# Patient Record
Sex: Female | Born: 1935 | Race: White | Hispanic: No | Marital: Married | Smoking: Former smoker
Health system: Southern US, Community
[De-identification: ages and names within clinical notes are randomized; demographics above are authoritative.]

## PROBLEM LIST (undated history)

## (undated) DIAGNOSIS — N739 Female pelvic inflammatory disease, unspecified: Secondary | ICD-10-CM

## (undated) DIAGNOSIS — K5732 Diverticulitis of large intestine without perforation or abscess without bleeding: Secondary | ICD-10-CM

## (undated) DIAGNOSIS — I700 Atherosclerosis of aorta: Secondary | ICD-10-CM

## (undated) DIAGNOSIS — M5136 Other intervertebral disc degeneration, lumbar region: Secondary | ICD-10-CM

## (undated) DIAGNOSIS — C801 Malignant (primary) neoplasm, unspecified: Secondary | ICD-10-CM

## (undated) DIAGNOSIS — E669 Obesity, unspecified: Secondary | ICD-10-CM

## (undated) DIAGNOSIS — Z923 Personal history of irradiation: Secondary | ICD-10-CM

## (undated) DIAGNOSIS — M21372 Foot drop, left foot: Secondary | ICD-10-CM

## (undated) DIAGNOSIS — N289 Disorder of kidney and ureter, unspecified: Secondary | ICD-10-CM

## (undated) DIAGNOSIS — E789 Disorder of lipoprotein metabolism, unspecified: Secondary | ICD-10-CM

## (undated) DIAGNOSIS — D72819 Decreased white blood cell count, unspecified: Secondary | ICD-10-CM

## (undated) DIAGNOSIS — E039 Hypothyroidism, unspecified: Secondary | ICD-10-CM

## (undated) DIAGNOSIS — M4310 Spondylolisthesis, site unspecified: Secondary | ICD-10-CM

## (undated) DIAGNOSIS — M47816 Spondylosis without myelopathy or radiculopathy, lumbar region: Secondary | ICD-10-CM

## (undated) DIAGNOSIS — Z800 Family history of malignant neoplasm of digestive organs: Secondary | ICD-10-CM

## (undated) DIAGNOSIS — E7800 Pure hypercholesterolemia, unspecified: Secondary | ICD-10-CM

## (undated) DIAGNOSIS — G629 Polyneuropathy, unspecified: Secondary | ICD-10-CM

## (undated) DIAGNOSIS — K219 Gastro-esophageal reflux disease without esophagitis: Secondary | ICD-10-CM

## (undated) DIAGNOSIS — M1990 Unspecified osteoarthritis, unspecified site: Secondary | ICD-10-CM

## (undated) DIAGNOSIS — M51369 Other intervertebral disc degeneration, lumbar region without mention of lumbar back pain or lower extremity pain: Secondary | ICD-10-CM

## (undated) DIAGNOSIS — M8580 Other specified disorders of bone density and structure, unspecified site: Secondary | ICD-10-CM

## (undated) DIAGNOSIS — Z8719 Personal history of other diseases of the digestive system: Secondary | ICD-10-CM

## (undated) DIAGNOSIS — M1712 Unilateral primary osteoarthritis, left knee: Secondary | ICD-10-CM

## (undated) DIAGNOSIS — K635 Polyp of colon: Secondary | ICD-10-CM

## (undated) DIAGNOSIS — K429 Umbilical hernia without obstruction or gangrene: Secondary | ICD-10-CM

## (undated) HISTORY — DX: Personal history of irradiation: Z92.3

## (undated) HISTORY — DX: Other intervertebral disc degeneration, lumbar region: M51.36

## (undated) HISTORY — DX: Unilateral primary osteoarthritis, left knee: M17.12

## (undated) HISTORY — PX: COLONOSCOPY: SHX174

## (undated) HISTORY — DX: Disorder of lipoprotein metabolism, unspecified: E78.9

## (undated) HISTORY — PX: TUBAL LIGATION: SHX77

## (undated) HISTORY — DX: Pure hypercholesterolemia, unspecified: E78.00

## (undated) HISTORY — DX: Obesity, unspecified: E66.9

## (undated) HISTORY — DX: Other specified disorders of bone density and structure, unspecified site: M85.80

## (undated) HISTORY — DX: Family history of malignant neoplasm of digestive organs: Z80.0

## (undated) HISTORY — DX: Other intervertebral disc degeneration, lumbar region without mention of lumbar back pain or lower extremity pain: M51.369

## (undated) HISTORY — PX: CATARACT EXTRACTION, BILATERAL: SHX1313

## (undated) HISTORY — DX: Polyp of colon: K63.5

## (undated) HISTORY — DX: Gastro-esophageal reflux disease without esophagitis: K21.9

## (undated) HISTORY — PX: CHOLECYSTECTOMY: SHX55

## (undated) HISTORY — PX: PTERYGIUM EXCISION: SHX2273

---

## 1999-04-26 ENCOUNTER — Encounter: Payer: Self-pay | Admitting: Emergency Medicine

## 1999-04-26 ENCOUNTER — Emergency Department (HOSPITAL_COMMUNITY): Admission: EM | Admit: 1999-04-26 | Discharge: 1999-04-26 | Payer: Self-pay | Admitting: Emergency Medicine

## 2001-03-15 ENCOUNTER — Ambulatory Visit (HOSPITAL_BASED_OUTPATIENT_CLINIC_OR_DEPARTMENT_OTHER): Admission: RE | Admit: 2001-03-15 | Discharge: 2001-03-15 | Payer: Self-pay | Admitting: *Deleted

## 2001-09-09 ENCOUNTER — Encounter: Admission: RE | Admit: 2001-09-09 | Discharge: 2001-09-23 | Payer: Self-pay | Admitting: Internal Medicine

## 2001-09-09 ENCOUNTER — Encounter (INDEPENDENT_AMBULATORY_CARE_PROVIDER_SITE_OTHER): Payer: Self-pay | Admitting: Specialist

## 2002-01-25 ENCOUNTER — Encounter: Payer: Self-pay | Admitting: Internal Medicine

## 2002-01-25 ENCOUNTER — Encounter: Admission: RE | Admit: 2002-01-25 | Discharge: 2002-01-25 | Payer: Self-pay | Admitting: Internal Medicine

## 2002-02-24 ENCOUNTER — Other Ambulatory Visit: Admission: RE | Admit: 2002-02-24 | Discharge: 2002-02-24 | Payer: Self-pay | Admitting: Obstetrics & Gynecology

## 2002-03-28 ENCOUNTER — Ambulatory Visit (HOSPITAL_COMMUNITY): Admission: RE | Admit: 2002-03-28 | Discharge: 2002-03-28 | Payer: Self-pay | Admitting: Gastroenterology

## 2004-01-30 ENCOUNTER — Encounter: Admission: RE | Admit: 2004-01-30 | Discharge: 2004-01-30 | Payer: Self-pay | Admitting: Internal Medicine

## 2004-07-17 ENCOUNTER — Other Ambulatory Visit: Admission: RE | Admit: 2004-07-17 | Discharge: 2004-07-17 | Payer: Self-pay | Admitting: Obstetrics & Gynecology

## 2006-01-08 ENCOUNTER — Other Ambulatory Visit: Admission: RE | Admit: 2006-01-08 | Discharge: 2006-01-08 | Payer: Self-pay | Admitting: Obstetrics & Gynecology

## 2007-02-04 ENCOUNTER — Encounter: Admission: RE | Admit: 2007-02-04 | Discharge: 2007-02-04 | Payer: Self-pay | Admitting: Obstetrics & Gynecology

## 2008-03-01 ENCOUNTER — Encounter: Admission: RE | Admit: 2008-03-01 | Discharge: 2008-03-01 | Payer: Self-pay | Admitting: Obstetrics & Gynecology

## 2008-06-22 ENCOUNTER — Encounter: Admission: RE | Admit: 2008-06-22 | Discharge: 2008-06-22 | Payer: Self-pay | Admitting: Family Medicine

## 2009-06-21 ENCOUNTER — Other Ambulatory Visit: Admission: RE | Admit: 2009-06-21 | Discharge: 2009-06-21 | Payer: Self-pay | Admitting: *Deleted

## 2009-08-27 ENCOUNTER — Encounter: Admission: RE | Admit: 2009-08-27 | Discharge: 2009-08-27 | Payer: Self-pay | Admitting: *Deleted

## 2010-08-20 ENCOUNTER — Other Ambulatory Visit: Admission: RE | Admit: 2010-08-20 | Discharge: 2010-08-20 | Payer: Self-pay | Admitting: Internal Medicine

## 2010-08-29 ENCOUNTER — Encounter: Admission: RE | Admit: 2010-08-29 | Discharge: 2010-08-29 | Payer: Self-pay | Admitting: Internal Medicine

## 2010-08-29 LAB — HM MAMMOGRAPHY

## 2010-11-28 ENCOUNTER — Ambulatory Visit (INDEPENDENT_AMBULATORY_CARE_PROVIDER_SITE_OTHER): Payer: 59 | Admitting: Family Medicine

## 2010-11-28 DIAGNOSIS — E7800 Pure hypercholesterolemia, unspecified: Secondary | ICD-10-CM

## 2010-11-28 DIAGNOSIS — E039 Hypothyroidism, unspecified: Secondary | ICD-10-CM

## 2010-12-12 ENCOUNTER — Ambulatory Visit (INDEPENDENT_AMBULATORY_CARE_PROVIDER_SITE_OTHER): Payer: 59

## 2010-12-12 ENCOUNTER — Other Ambulatory Visit: Payer: Self-pay | Admitting: Family Medicine

## 2010-12-12 DIAGNOSIS — Z23 Encounter for immunization: Secondary | ICD-10-CM

## 2010-12-12 DIAGNOSIS — Z780 Asymptomatic menopausal state: Secondary | ICD-10-CM

## 2010-12-26 ENCOUNTER — Ambulatory Visit
Admission: RE | Admit: 2010-12-26 | Discharge: 2010-12-26 | Disposition: A | Discharge status: Home / Self Care | Payer: 59 | Source: Ambulatory Visit | Attending: Family Medicine | Admitting: Family Medicine

## 2010-12-26 DIAGNOSIS — Z780 Asymptomatic menopausal state: Secondary | ICD-10-CM

## 2010-12-26 LAB — HM DEXA SCAN

## 2011-02-14 NOTE — Op Note (Signed)
[place]. [facility]  Patient:    Kayla Guerrero, Kayla Guerrero                     MRN: [id number] Proc. Date: 03/15/01 Adm. Date:  [id number] Attending:  Claudina Lick                           Operative Report  PREOPERATIVE DIAGNOSIS:  Chronic left secretory otitis media.  POSTOPERATIVE DIAGNOSIS:  Chronic left otitis media.  OPERATION PERFORMED:  Left tube myringotomy.  SURGEON:  Robert L. Lyman Bishop, M.D.  ANESTHESIA:  General.  INDICATIONS FOR PROCEDURE:  This 76 year old white female has had a history of a persistent chronic left ear infection with persistent middle ear fluid.  It started with an upper respiratory infection.  She has been treated with antibiotics, decongestants, sinuses have cleared with a normal CT scan. Examination of the nasopharynx was negative.  The patient has had persistent fluid in the left middle ear and is admitted for tube myringotomy.  DESCRIPTION OF PROCEDURE:  After satisfactory general mask anesthesia had been induced using the LMA, examination showed an opaque hypervascular, whitish slightly bulging eardrum.  A radial myringotomy incision was made in the anterior inferior quadrant and mostly clear mucoid exudate was evacuated.  It did not seem to remove the fluid more posteriorly.  A Donaldson Silastic collar button tube was inserted in the incision and an additional radial myringotomy incision was made in the posterior inferior quadrant and some thick mucopurulent exudate was evacuated with the suction.  Following this, the eardrum had a more translucent clear appearance.  Cortisporin suspension drops were instilled and insufflated through the tube and additional myringotomy incision and down the eustachian tube with ease.  ESTIMATED BLOOD LOSS:  1 to 2 cc.  The patient tolerated the procedure well, was awakened from anesthesia and taken to the recovery room in satisfactory condition. DD:  03/15/01 TD:   03/15/01 Job: [id number]

## 2011-02-14 NOTE — Procedures (Signed)
[place]. [facility]  Patient:    Kayla Guerrero, Kayla Guerrero Visit Number: [id number] MRN: [id number]          Service Type: END Location: ENDO Attending Physician:  Rich Brave Dictated by:   Florencia Reasons, M.D. Proc. Date: 03/28/02 Admit Date:  03/28/2002   CC:         [place]. Daphine Deutscher, M.D.   Procedure Report  PROCEDURE:  Colonoscopy with biopsy.  INDICATION:  A 76 year old female, for colon cancer screening.  There is a family history of colon cancer in her mother, who was diagnosed in her 48s.  FINDINGS:  Diminutive cecal polyp.  Scattered diverticulosis.  DESCRIPTION OF PROCEDURE:  The nature, purpose, and risks of the procedure had been discussed with the patient, who provided written consent.  The patient received sedation with fentanyl 50 mcg and Versed 5 mg IV without arrhythmias or desaturation.  An Olympus adjustable-tension pediatric video colonoscope was easily advanced to the cecum as identified by typical cecal appearance, including the ileocecal valve, and the absence of further lumen.  Pullback was then performed.  The quality of the prep was excellent, and it was felt that all areas were well-seen.  There was scattered diverticulosis throughout much of the colon.  There was a 2 mm cecal polyp removed by a couple of cold biopsies.  No large polyps, cancer, colitis, or vascular malformations were observed, and retroflexion in the rectum as well as reinspection of the rectosigmoid was unremarkable.  The patient tolerated the procedure well, and there were no apparent complications.  IMPRESSION: 1. Solitary diminutive polyp, removed by cold biopsy technique as described    above. 2. Scattered colonic diverticulosis.  PLAN:  Await pathology on the polyp.  Anticipate follow-up colonoscopy in five years regardless of the polyp histology, in view of the family history of colon cancer and the small size of the current  polyp. Dictated by:   Florencia Reasons, M.D. Attending Physician:  Rich Brave DD:  03/28/02 TD:  03/29/02 Job: [id number]

## 2011-06-03 ENCOUNTER — Telehealth: Payer: Self-pay | Admitting: Family Medicine

## 2011-06-03 ENCOUNTER — Other Ambulatory Visit: Payer: Self-pay | Admitting: Family Medicine

## 2011-06-03 ENCOUNTER — Other Ambulatory Visit: Payer: 59

## 2011-06-03 DIAGNOSIS — Z79899 Other long term (current) drug therapy: Secondary | ICD-10-CM

## 2011-06-03 LAB — LIPID PANEL
LDL Cholesterol: 160 mg/dL — ABNORMAL HIGH (ref 0–99)
Total CHOL/HDL Ratio: 4.4 Ratio
VLDL: 20 mg/dL (ref 0–40)

## 2011-06-04 ENCOUNTER — Telehealth: Payer: Self-pay | Admitting: *Deleted

## 2011-06-04 NOTE — Telephone Encounter (Signed)
See lab encounter.  If she wants TSH added on, does she have enough to wait for results before refill sent?

## 2011-06-04 NOTE — Telephone Encounter (Signed)
Left message on pt's home and cell numbers asking her to return my call.

## 2011-06-04 NOTE — Telephone Encounter (Signed)
Added on TSH to blood from yesterday, once resulted, Dr.Knapp will review and we can call in pt's levothyroxine to [facility]. Explained this to pt's husband. Patient will call back around 4:30pm today for lipid results.

## 2011-06-05 ENCOUNTER — Other Ambulatory Visit: Payer: Self-pay

## 2011-06-05 ENCOUNTER — Other Ambulatory Visit: Payer: Self-pay | Admitting: *Deleted

## 2011-06-05 NOTE — Telephone Encounter (Signed)
Called pt to let him know labs ok and for refills left message

## 2011-06-05 NOTE — Telephone Encounter (Signed)
Pt notified of lab results.  Called in Levothyroxine to [facility] at [phone] 1 po qd #90 with 3 refills.  Pt is scheduled to come in on 06-11-11 at 3 pm to discuss lab results.  CM, LPN

## 2011-06-05 NOTE — Progress Notes (Signed)
Spoke with pt regarding lab results.  Called in Levothyroxine 75 mcg 1 po QD #90 with 3 refills to [facility].  Pt stated that she will call back and schedule an appointment.  CM, LPN

## 2011-06-09 ENCOUNTER — Encounter: Payer: Self-pay | Admitting: Family Medicine

## 2011-06-11 ENCOUNTER — Encounter: Payer: Self-pay | Admitting: Family Medicine

## 2011-06-11 ENCOUNTER — Ambulatory Visit (INDEPENDENT_AMBULATORY_CARE_PROVIDER_SITE_OTHER): Payer: 59 | Admitting: Family Medicine

## 2011-06-11 VITALS — BP 114/64 | HR 68 | Ht 67.0 in | Wt 207.0 lb

## 2011-06-11 DIAGNOSIS — Z131 Encounter for screening for diabetes mellitus: Secondary | ICD-10-CM

## 2011-06-11 DIAGNOSIS — E039 Hypothyroidism, unspecified: Secondary | ICD-10-CM | POA: Insufficient documentation

## 2011-06-11 DIAGNOSIS — E7800 Pure hypercholesterolemia, unspecified: Secondary | ICD-10-CM

## 2011-06-11 NOTE — Patient Instructions (Signed)
Continue with low cholesterol diet. I recommend using a medication to lower your cholesterol further--Red Yeast Rice is fine for you to try.  This has similar risks to statin medications, and we need to monitor your liver functions while taking.  Plan to recheck liver and lipids in 3 months

## 2011-06-11 NOTE — Progress Notes (Signed)
Patient presents to discuss her cholesterol results.  She has lost about 30 pounds by eating less, moving more. She is doing water exercises for 1 hour 3x/week, and tries to walk 3 days/week, but hasn't been doing much walking recently.  She likes cheese, but doesn't eat it daily (pizza, pimento cheese to snack).  Eats a lot of chicken, vegetables, fruit, rarely eats eggs.  Drinks skim milk.  Oil and vinegar on her salads.  She took something called "cholesterol care" many years ago, which contained Red Yeast Rice.  Cholesterol was much lower.  It seemed to lose its efficacy as she was on it for a while.  Past Medical History  Diagnosis Date  . GERD (gastroesophageal reflux disease)   . Obesity   . Borderline osteopenia DEXA 2006 and 2012  . Cholesterol serum elevated   . FHx: colon cancer   . Colon polyps   . Cataract     Past Surgical History  Procedure Date  . Cholecystectomy   . Tubal ligation   . Colonoscopy 2010    Buccini  . Cataract extraction, bilateral   . Pterygium excision     History   Social History  . Marital Status: Married    Spouse Name: N/A    Number of Children: 1  . Years of Education: N/A   Occupational History  . Not on file.   Social History Main Topics  . Smoking status: Former Smoker    Quit date: 09/29/1980  . Smokeless tobacco: Never Used  . Alcohol Use: Yes     1 drink per week.  . Drug Use: No  . Sexually Active: Not on file   Other Topics Concern  . Not on file   Social History Narrative   Lives with husband.  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 Cancer Mother 13    COLON   . Heart disease Mother   . Mental illness Maternal Uncle   . Cancer Maternal Grandmother     stomach  . Macular degeneration Maternal Grandmother   . Diabetes Neg Hx     Current outpatient prescriptions:calcium carbonate (OS-CAL) 600 MG TABS, Take  600 mg by mouth daily.  , Disp: , Rfl: ;  Cholecalciferol (VITAMIN D) 1000 UNITS capsule, Take 1,000 Units by mouth daily.  , Disp: , Rfl: ;  fish oil-omega-3 fatty acids 1000 MG capsule, Take 2 g by mouth daily.  , Disp: , Rfl: ;  glucosamine-chondroitin 500-400 MG tablet, Take 1 tablet by mouth 2 (two) times daily. , Disp: , Rfl:  levothyroxine (SYNTHROID, LEVOTHROID) 75 MCG tablet, Take 75 mcg by mouth daily.  , Disp: , Rfl: ;  Magnesium 400 MG CAPS, Take 1 capsule by mouth daily.  , Disp: , Rfl: ;  Multiple Vitamin (MULTI-VITAMIN) tablet, Take 1 tablet by mouth daily.  , Disp: , Rfl: ;  omeprazole (PRILOSEC) 40 MG capsule, Take 40 mg by mouth daily.  , Disp: , Rfl:   Allergies  Allergen Reactions  . Levaquin (Levofloxacin Hemihydrate) Other (See Comments)  . Phenergan Other (See Comments)  . Sulfa Drugs Cross Reactors Swelling   ROS:  Denies thyroid symptoms.  +intentional weight loss.  Denies fevers, URI symptoms, chest pain, or other concerns  PHYSICAL EXAM: Well developed, pleasant female in no distress BP 114/64  Pulse 68  Ht 5\' 7"  (1.702 m)  Wt  207 lb (93.895 kg)  BMI 32.42 kg/m2 Remainder of exam limited to discussion, counseling  ASSESSMENT/PLAN: 1. Pure hypercholesterolemia  Lipid panel, Hepatic function panel  2. Screening for diabetes mellitus  Glucose, random  3. Unspecified hypothyroidism     Hyperlipidemia--HDL seems to have dropped considerably over the last 2 years.  LDL has decreased some, but ratio is now >4 (was <4 in 2010, when LDL higher).  Pt prefers to avoid prescription medication, but is willing to retry a red yeast rice product.  Reviewed low cholesterol diet at length.  Her diet is pretty good, and like has genetic component to her hyperlipidemia.  Increase exercise to 5-6 days/week; continue low cholesterol diet  Hypothyroidism--adequately replaced per recent labs  F/u in December for CPE, with labs prior

## 2011-06-19 ENCOUNTER — Other Ambulatory Visit (INDEPENDENT_AMBULATORY_CARE_PROVIDER_SITE_OTHER): Payer: 59

## 2011-06-19 DIAGNOSIS — Z23 Encounter for immunization: Secondary | ICD-10-CM

## 2011-06-19 DIAGNOSIS — Z0000 Encounter for general adult medical examination without abnormal findings: Secondary | ICD-10-CM

## 2011-09-02 ENCOUNTER — Other Ambulatory Visit: Payer: 59

## 2011-09-02 DIAGNOSIS — E7800 Pure hypercholesterolemia, unspecified: Secondary | ICD-10-CM

## 2011-09-02 DIAGNOSIS — Z131 Encounter for screening for diabetes mellitus: Secondary | ICD-10-CM

## 2011-09-03 ENCOUNTER — Encounter: Payer: Self-pay | Admitting: Family Medicine

## 2011-09-03 LAB — HEPATIC FUNCTION PANEL
AST: 19 U/L (ref 0–37)
Albumin: 4.1 g/dL (ref 3.5–5.2)
Alkaline Phosphatase: 47 U/L (ref 39–117)
Bilirubin, Direct: 0.1 mg/dL (ref 0.0–0.3)
Total Bilirubin: 0.4 mg/dL (ref 0.3–1.2)

## 2011-09-03 LAB — GLUCOSE, RANDOM: Glucose, Bld: 85 mg/dL (ref 70–99)

## 2011-09-03 LAB — LIPID PANEL
Cholesterol: 194 mg/dL (ref 0–200)
Total CHOL/HDL Ratio: 3 Ratio

## 2011-09-11 ENCOUNTER — Encounter: Payer: Self-pay | Admitting: Family Medicine

## 2011-09-11 ENCOUNTER — Ambulatory Visit (INDEPENDENT_AMBULATORY_CARE_PROVIDER_SITE_OTHER): Payer: 59 | Admitting: Family Medicine

## 2011-09-11 VITALS — BP 122/80 | HR 64 | Ht 66.0 in | Wt 201.0 lb

## 2011-09-11 DIAGNOSIS — E039 Hypothyroidism, unspecified: Secondary | ICD-10-CM

## 2011-09-11 DIAGNOSIS — E7800 Pure hypercholesterolemia, unspecified: Secondary | ICD-10-CM

## 2011-09-11 DIAGNOSIS — Z0000 Encounter for general adult medical examination without abnormal findings: Secondary | ICD-10-CM

## 2011-09-11 LAB — POCT URINALYSIS DIPSTICK
Glucose, UA: NEGATIVE
Leukocytes, UA: NEGATIVE
Protein, UA: NEGATIVE
Spec Grav, UA: 1.015
Urobilinogen, UA: NEGATIVE

## 2011-09-11 NOTE — Patient Instructions (Addendum)
HEALTH MAINTENANCE RECOMMENDATIONS:  It is recommended that you get at least 30 minutes of aerobic exercise at least 5 days/week (for weight loss, you may need as much as 60-90 minutes). This can be any activity that gets your heart rate up. This can be divided in 10-15 minute intervals if needed, but try and build up your endurance at least once a week.  Weight bearing exercise is also recommended twice weekly.  Eat a healthy diet with lots of vegetables, fruits and fiber.  "Colorful" foods have a lot of vitamins (ie green vegetables, tomatoes, red peppers, etc).  Limit sweet tea, regular sodas and alcoholic beverages, all of which has a lot of calories and sugar.  Up to 1 alcoholic drink daily may be beneficial for women (unless trying to lose weight, watch sugars).  Drink a lot of water.  Calcium recommendations are 1200-1500 mg daily (1500 mg for postmenopausal women or women without ovaries), and vitamin D 1000 IU daily.  This should be obtained from diet and/or supplements (vitamins), and calcium should not be taken all at once, but in divided doses.  Monthly self breast exams and yearly mammograms for women over the age of 17 is recommended.  Sunscreen of at least SPF 30 should be used on all sun-exposed parts of the skin when outside between the hours of 10 am and 4 pm (not just when at beach or pool, but even with exercise, golf, tennis, and yard work!)  Use a sunscreen that says "broad spectrum" so it covers both UVA and UVB rays, and make sure to reapply every 1-2 hours.  Remember to change the batteries in your smoke detectors when changing your clock times in the spring and fall.  Use your seat belt every time you are in a car, and please drive safely and not be distracted with cell phones and texting while driving.  Shingles vaccine is recommended. Please schedule appointment with dermatologist

## 2011-09-11 NOTE — Progress Notes (Signed)
Kayla Guerrero is a 76 y.o. female who presents for a complete physical.  She has the following concerns: She had labs prior to her appointment to follow up on her cholesterol.  She has been following a low cholesterol diet and has started taking Red Yeast Rice twice daily, without any side effects.  Decreased libido.  Didn't tolerate HRT in past due to it causing depression.  Doesn't want to retry hormones.   Immunization History  Administered Date(s) Administered  . H1N1 06/21/2009  . Influenza Split 06/19/2011  . Influenza Whole 07/12/2010  . Pneumococcal Polysaccharide 08/20/2010  . Tdap 12/12/2010   Last Pap smear: 07/2010 with Dr. Zenaida Niece Last mammogram: 08/2010 Last colonoscopy: 2010 Dr. Matthias Hughs Last DEXA: 11/2010 Dentist: every 6 months Ophtho: yearly, due now Exercise: water exercise 3 times/week  Past Medical History  Diagnosis Date  . GERD (gastroesophageal reflux disease)   . Obesity   . Borderline osteopenia DEXA 2006 and 2012  . Cholesterol serum elevated   . FHx: colon cancer   . Colon polyps   . Cataract     Past Surgical History  Procedure Date  . Cholecystectomy   . Tubal ligation   . Colonoscopy 2010    Buccini  . Cataract extraction, bilateral   . Pterygium excision     History   Social History  . Marital Status: Married    Spouse Name: N/A    Number of Children: 1  . Years of Education: N/A   Occupational History  . retired Education officer, community); Artist    Social History Main Topics  . Smoking status: Former Smoker    Quit date: 09/29/1980  . Smokeless tobacco: Never Used  . Alcohol Use: Yes     1 drink per week.  . Drug Use: No  . Sexually Active: Not on file   Other Topics Concern  . Not on file   Social History Narrative   Lives with husband.  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 Cancer Mother 13    COLON   . Heart disease Mother   . Mental illness Maternal Uncle   . Cancer Maternal Grandmother     stomach  . Macular degeneration Maternal Grandmother   . Diabetes Neg Hx     Current outpatient prescriptions:calcium carbonate (OS-CAL) 600 MG TABS, Take 600 mg by mouth daily.  , Disp: , Rfl: ;  Cholecalciferol (VITAMIN D) 1000 UNITS capsule, Take 1,000 Units by mouth daily.  , Disp: , Rfl: ;  fish oil-omega-3 fatty acids 1000 MG capsule, Take 1 g by mouth daily. , Disp: , Rfl: ;  glucosamine-chondroitin 500-400 MG tablet, Take 1 tablet by mouth 2 (two) times daily. , Disp: , Rfl:  levothyroxine (SYNTHROID, LEVOTHROID) 75 MCG tablet, Take 75 mcg by mouth daily.  , Disp: , Rfl: ;  Magnesium 400 MG CAPS, Take 1 capsule by mouth daily.  , Disp: , Rfl: ;  Multiple Vitamin (MULTI-VITAMIN) tablet, Take 1 tablet by mouth daily.  , Disp: , Rfl: ;  omeprazole (PRILOSEC OTC) 20 MG tablet, Take 20 mg by mouth daily.  , Disp: , Rfl: ;  Red Yeast Rice 600 MG CAPS, Take 2 capsules by mouth 2 (two) times daily.  , Disp: , Rfl:   Allergies  Allergen Reactions  . Levaquin (Levofloxacin Hemihydrate)  Other (See Comments)  . Phenergan Other (See Comments)  . Sulfa Drugs Cross Reactors Swelling   ROS: The patient denies anorexia, fever, weight changes, headaches,  vision changes, decreased hearing, ear pain, sore throat, breast concerns, chest pain, palpitations, dizziness, syncope, dyspnea on exertion, cough, swelling, nausea, vomiting, diarrhea, constipation, abdominal pain, melena, hematochezia, indigestion/heartburn, hematuria, incontinence, dysuria, vaginal bleeding, discharge, odor or itch, genital lesions, numbness, tingling, weakness, tremor, suspicious skin lesions, depression, anxiety, abnormal bleeding/bruising, or enlarged lymph nodes. +decreased libido, + weight loss, intentional; occasional pain R hip when lies on it (short lived and sharp); rare short-lived sharp pain in great  toe, can't remember which foot, thinks L.  Denies depression. Occasional L knee pain. +memory troubles (unimportant things, nothing significant)   PHYSICAL EXAM: BP 122/80  Pulse 64  Ht 5\' 6"  (1.676 m)  Wt 201 lb (91.173 kg)  BMI 32.44 kg/m2  General Appearance:    Alert, cooperative, no distress, appears stated age  Head:    Normocephalic, without obvious abnormality, atraumatic  Eyes:    PERRL, conjunctiva/corneas clear, EOM's intact, fundi    benign  Ears:    Normal TM's and external ear canals  Nose:   Nares normal, mucosa normal, no drainage or sinus   tenderness  Throat:   Lips, mucosa, and tongue normal; teeth and gums normal  Neck:   Supple, no lymphadenopathy;  thyroid:  no   enlargement/tenderness/nodules; no carotid   bruit or JVD  Back:    Spine nontender, no curvature, ROM normal, no CVA     tenderness  Lungs:     Clear to auscultation bilaterally without wheezes, rales or     ronchi; respirations unlabored  Chest Wall:    No tenderness or deformity   Heart:    Regular rate and rhythm, S1 and S2 normal, no murmur, rub   or gallop  Breast Exam:    No tenderness, masses, or nipple discharge or inversion.      No axillary lymphadenopathy  Abdomen:     Soft, non-tender, nondistended, normoactive bowel sounds,    no masses, no hepatosplenomegaly  Genitalia:    Normal external genitalia without lesions.  BUS and vagina normal; no cervical motion tenderness. No abnormal vaginal discharge.  +atrophic changes. Uterus and adnexa not enlarged, nontender, no masses.  Pap not performed  Rectal:    Normal tone, no masses or tenderness; guaiac negative stool; +external hemorrhoids  Extremities:   No clubbing, cyanosis or edema  Pulses:   2+ and symmetric all extremities  Skin:   Skin color, texture, turgor normal, no rashes or lesions. Small ulcerated/ scabbed lesion R neck/posterior shoulder  Lymph nodes:   Cervical, supraclavicular, and axillary nodes normal  Neurologic:   CNII-XII  intact, normal strength, sensation and gait; reflexes 2+ and symmetric throughout          Psych:   Normal mood, affect, hygiene and grooming.   LABS: Lab Results  Component Value Date   CHOL 194 09/02/2011   CHOL 233* 06/03/2011   Lab Results  Component Value Date   HDL 65 09/02/2011   HDL 53 0/12/5407   Lab Results  Component Value Date   LDLCALC 115* 09/02/2011   LDLCALC 160* 06/03/2011   Lab Results  Component Value Date   TRIG 68 09/02/2011   TRIG 102 06/03/2011   Lab Results  Component Value Date   CHOLHDL 3.0 09/02/2011   CHOLHDL 4.4 06/03/2011   No results found for this basename: LDLDIRECT  Lab Results  Component Value Date   ALT 8 09/02/2011   AST 19 09/02/2011   ALKPHOS 47 09/02/2011   BILITOT 0.4 09/02/2011   Lab Results  Component Value Date   TSH 1.386 06/03/2011   ASSESSMENT/PLAN:  1. Routine general medical examination at a health care facility  POCT Urinalysis Dipstick, Visual acuity screening  2. Pure hypercholesterolemia  Comprehensive metabolic panel, Lipid panel   improved with red yeast rice  3. Unspecified hypothyroidism  TSH   Discussed monthly self breast exams and yearly mammograms; at least 30 minutes of aerobic activity at least 5 days/week; proper sunscreen use reviewed; healthy diet, including goals of calcium and vitamin D intake and alcohol recommendations (less than or equal to 1 drink/day) reviewed; regular seatbelt use; changing batteries in smoke detectors.  Immunization recommendations discussed.  Colonoscopy recommendations reviewed  Shingles vaccine risks/benefits reviewed. Rx written  Needs biopsy of lesion on shoulder. Offered here--she prefers to see derm because has other skin concerns.  F/u in 6 months with labs prior--TSH, c-met and lipids

## 2011-09-15 ENCOUNTER — Other Ambulatory Visit: Payer: Self-pay | Admitting: Family Medicine

## 2011-09-15 DIAGNOSIS — Z1231 Encounter for screening mammogram for malignant neoplasm of breast: Secondary | ICD-10-CM

## 2011-10-08 ENCOUNTER — Ambulatory Visit
Admission: RE | Admit: 2011-10-08 | Discharge: 2011-10-08 | Disposition: A | Discharge status: Home / Self Care | Payer: 59 | Source: Ambulatory Visit | Attending: Family Medicine | Admitting: Family Medicine

## 2011-10-08 DIAGNOSIS — Z1231 Encounter for screening mammogram for malignant neoplasm of breast: Secondary | ICD-10-CM

## 2012-03-03 ENCOUNTER — Encounter: Payer: Self-pay | Admitting: Internal Medicine

## 2012-03-09 ENCOUNTER — Other Ambulatory Visit: Payer: 59

## 2012-03-09 DIAGNOSIS — E7800 Pure hypercholesterolemia, unspecified: Secondary | ICD-10-CM

## 2012-03-09 DIAGNOSIS — E039 Hypothyroidism, unspecified: Secondary | ICD-10-CM

## 2012-03-10 LAB — COMPREHENSIVE METABOLIC PANEL
ALT: <8 U/L (ref 0–35)
AST: 16 U/L (ref 0–37)
Albumin: 4 g/dL (ref 3.5–5.2)
Alkaline Phosphatase: 46 U/L (ref 39–117)
Potassium: 4.2 mEq/L (ref 3.5–5.3)
Sodium: 141 mEq/L (ref 135–145)
Total Bilirubin: 0.4 mg/dL (ref 0.3–1.2)
Total Protein: 6.5 g/dL (ref 6.0–8.3)

## 2012-03-10 LAB — LIPID PANEL
LDL Cholesterol: 100 mg/dL — ABNORMAL HIGH (ref 0–99)
VLDL: 13 mg/dL (ref 0–40)

## 2012-03-10 LAB — TSH: TSH: 2.436 u[IU]/mL (ref 0.350–4.500)

## 2012-03-11 ENCOUNTER — Encounter: Payer: Self-pay | Admitting: Family Medicine

## 2012-03-11 ENCOUNTER — Ambulatory Visit (INDEPENDENT_AMBULATORY_CARE_PROVIDER_SITE_OTHER): Payer: 59 | Admitting: Family Medicine

## 2012-03-11 VITALS — BP 120/72 | HR 64 | Ht 66.0 in | Wt 204.0 lb

## 2012-03-11 DIAGNOSIS — E7800 Pure hypercholesterolemia, unspecified: Secondary | ICD-10-CM

## 2012-03-11 DIAGNOSIS — E039 Hypothyroidism, unspecified: Secondary | ICD-10-CM

## 2012-03-11 MED ORDER — LEVOTHYROXINE SODIUM 75 MCG PO TABS: 75.0000 ug | ORAL_TABLET | Freq: Every day | ORAL | Status: DC

## 2012-03-11 NOTE — Patient Instructions (Signed)
Cholesterol is MUCH improved--continue the red yeast rice. Try and increase exercise to 5-6 days per week, and work on diet/portions to help you to continue to lose weight.

## 2012-03-11 NOTE — Progress Notes (Signed)
Chief Complaint  Patient presents with  . Med check    6 month med check, labs done.   HPI:  Patient presents for follow up on hyperlipidemia and hypothyroidism. She denies any specific complaints.  She has been doing water aerobics three times a week.  Hyperlipidemia follow-up:  Patient is reportedly following a low-fat, low cholesterol diet.  Has been taking red yeast rice regularly and denies side effects.  Past Medical History  Diagnosis Date  . GERD (gastroesophageal reflux disease)   . Obesity   . Borderline osteopenia DEXA 2006 and 2012  . Cholesterol serum elevated   . FHx: colon cancer   . Colon polyps   . Cataract     Past Surgical History  Procedure Date  . Cholecystectomy   . Tubal ligation   . Colonoscopy 2010    Buccini  . Cataract extraction, bilateral   . Pterygium excision     History   Social History  . Marital Status: Married    Spouse Name: N/A    Number of Children: 1  . Years of Education: N/A   Occupational History  . retired Education officer, community); Artist    Social History Main Topics  . Smoking status: Former Smoker    Quit date: 09/29/1980  . Smokeless tobacco: Never Used  . Alcohol Use: Yes     1 drink per week.  . Drug Use: No  . Sexually Active: Not on file   Other Topics Concern  . Not on file   Social History Narrative   Lives with husband.  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 Cancer Mother 32    COLON   . Heart disease Mother   . Mental illness Maternal Uncle   . Cancer Maternal Grandmother     stomach  . Macular degeneration Maternal Grandmother   . Diabetes Neg Hx     Current outpatient prescriptions:calcium carbonate (OS-CAL) 600 MG TABS, Take 600 mg by mouth daily.  , Disp: , Rfl: ;  Cholecalciferol (VITAMIN D) 1000 UNITS capsule, Take 1,000 Units by mouth daily.   , Disp: , Rfl: ;  fish oil-omega-3 fatty acids 1000 MG capsule, Take 1 g by mouth daily. , Disp: , Rfl: ;  glucosamine-chondroitin 500-400 MG tablet, Take 1 tablet by mouth 2 (two) times daily. , Disp: , Rfl:  levothyroxine (SYNTHROID, LEVOTHROID) 75 MCG tablet, Take 1 tablet (75 mcg total) by mouth daily., Disp: 90 tablet, Rfl: 3;  Magnesium 400 MG CAPS, Take 1 capsule by mouth daily.  , Disp: , Rfl: ;  Multiple Vitamin (MULTI-VITAMIN) tablet, Take 1 tablet by mouth daily.  , Disp: , Rfl: ;  Red Yeast Rice 600 MG CAPS, Take 2 capsules by mouth 2 (two) times daily.  , Disp: , Rfl:  DISCONTD: levothyroxine (SYNTHROID, LEVOTHROID) 75 MCG tablet, Take 75 mcg by mouth daily.  , Disp: , Rfl: ;  omeprazole (PRILOSEC OTC) 20 MG tablet, Take 20 mg by mouth daily.  , Disp: , Rfl:   Allergies  Allergen Reactions  . Levaquin (Levofloxacin Hemihydrate) Other (See Comments)  . Promethazine Hcl Other (See Comments)  . Sulfa Drugs Cross Reactors Swelling   ROS: Slight constipation, urinary frequency but no incontinence. Denies headaches, dizziness, chest pain, shortness of breath, URI symptoms, skin rashes, joint pains or other concerns.  PHYSICAL  EXAM: BP 120/72  Pulse 64  Ht 5\' 6"  (1.676 m)  Wt 204 lb (92.534 kg)  BMI 32.93 kg/m2 HEENT:  PERRL, conjunctiva clear. Neck: no lymphadenopathy, thyromegaly or mass. No carotid bruit Heart: regular rate and rhythm without murmur Lungs: clear bilaterally Abdomen: soft, nontender, no organomegaly or mass Extremities: no edema Skin: no rash Psych: normal mood, affect, hygiene and grooming  Lab Results  Component Value Date   TSH 2.436 03/09/2012   Lab Results  Component Value Date   CHOL 174 03/09/2012   HDL 61 03/09/2012   LDLCALC 100* 03/09/2012   TRIG 65 03/09/2012   CHOLHDL 2.9 03/09/2012     Chemistry      Component Value Date/Time   NA 141 03/09/2012 0832   K 4.2 03/09/2012 0832   CL 107 03/09/2012 0832   CO2 28 03/09/2012 0832   BUN 11 03/09/2012  0832   CREATININE 0.89 03/09/2012 0832      Component Value Date/Time   CALCIUM 9.1 03/09/2012 0832   ALKPHOS 46 03/09/2012 0832   AST 16 03/09/2012 0832   ALT <8 03/09/2012 0832   BILITOT 0.4 03/09/2012 0832     ASSESSMENT/PLAN: 1. Pure hypercholesterolemia    2. Unspecified hypothyroidism  levothyroxine (SYNTHROID, LEVOTHROID) 75 MCG tablet   Weight loss encouraged.  Increase exercise to 5-6 days/week.  Continue Red yeast rice and low cholesterol diet.  F/u in 6 months for CPE with lipids and LFT's done prior.

## 2012-06-10 ENCOUNTER — Telehealth: Payer: Self-pay | Admitting: Family Medicine

## 2012-06-10 NOTE — Telephone Encounter (Signed)
Called [facility] and they have the rx from 03/11/12 for #90 with 3 refills-pt never called there, called is instead. Also notified patient that her rx would be ready at [facility] today.

## 2012-06-10 NOTE — Telephone Encounter (Signed)
Done in June for #90 with 3 refills to [facility].  Please have her check with pharm

## 2012-06-22 ENCOUNTER — Other Ambulatory Visit: Payer: 59

## 2012-06-22 DIAGNOSIS — Z23 Encounter for immunization: Secondary | ICD-10-CM

## 2012-06-22 MED ORDER — INFLUENZA VIRUS VACC SPLIT PF IM SUSP: 0.5000 mL | Freq: Once | INTRAMUSCULAR | Status: AC

## 2012-08-05 ENCOUNTER — Ambulatory Visit (INDEPENDENT_AMBULATORY_CARE_PROVIDER_SITE_OTHER): Payer: 59 | Admitting: Family Medicine

## 2012-08-05 ENCOUNTER — Encounter: Payer: Self-pay | Admitting: Family Medicine

## 2012-08-05 VITALS — BP 110/74 | HR 80 | Ht 66.0 in | Wt 222.0 lb

## 2012-08-05 DIAGNOSIS — M25569 Pain in unspecified knee: Secondary | ICD-10-CM

## 2012-08-05 DIAGNOSIS — M25562 Pain in left knee: Secondary | ICD-10-CM

## 2012-08-05 MED ORDER — NAPROXEN 500 MG PO TABS: 500.0000 mg | ORAL_TABLET | Freq: Two times a day (BID) | ORAL | Status: DC

## 2012-08-05 NOTE — Progress Notes (Signed)
Chief Complaint  Patient presents with  . Knee Pain    left knee pain x 3 weeks. Has gotten really bad in the past 3 days.   Complaining of a "pull" in the back of her left knee x 3 weeks.  Feels it when she is bending the knee with weight on it.  No pain with standing.  Some pain with going up stairs.  Denies any swelling. Denies weakness or giving way. She walked a lot while in [place] last week.  Otherwise, denies any injury, fall, or other changes in activity.  She took an Aleve just once, doesn't recall if it helped.  H/o injury to L knee 2-3 years ago after she fell in [place] (knee strain, resolved on its own).  She is going on a cruise Monday, so presents for evaluation, wondering if she should wear a brace, or what she should do.  Past Medical History  Diagnosis Date  . GERD (gastroesophageal reflux disease)   . Obesity   . Borderline osteopenia DEXA 2006 and 2012  . Cholesterol serum elevated   . FHx: colon cancer   . Colon polyps   . Cataract    Past Surgical History  Procedure Date  . Cholecystectomy   . Tubal ligation   . Colonoscopy 2010    Buccini  . Cataract extraction, bilateral   . Pterygium excision    History   Social History  . Marital Status: Married    Spouse Name: N/A    Number of Children: 1  . Years of Education: N/A   Occupational History  . retired Education officer, community); Artist    Social History Main Topics  . Smoking status: Former Smoker    Quit date: 09/29/1980  . Smokeless tobacco: Never Used  . Alcohol Use: Yes     Comment: 1 drink per week.  . Drug Use: No  . Sexually Active: Not on file   Other Topics Concern  . Not on file   Social History Narrative   Lives with husband.  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  , Disp: , Rfl: ;   Cholecalciferol (VITAMIN D) 1000 UNITS capsule, Take 1,000 Units by mouth daily.  , Disp: , Rfl: ;  fish oil-omega-3 fatty acids 1000 MG capsule, Take 1 g by mouth daily. , Disp: , Rfl: ;  glucosamine-chondroitin 500-400 MG tablet, Take 1 tablet by mouth 2 (two) times daily. , Disp: , Rfl:  levothyroxine (SYNTHROID, LEVOTHROID) 75 MCG tablet, Take 1 tablet (75 mcg total) by mouth daily., Disp: 90 tablet, Rfl: 3;  Magnesium 400 MG CAPS, Take 1 capsule by mouth daily.  , Disp: , Rfl: ;  Multiple Vitamin (MULTI-VITAMIN) tablet, Take 1 tablet by mouth daily.  , Disp: , Rfl: ;  omeprazole (PRILOSEC OTC) 20 MG tablet, Take 20 mg by mouth daily.  , Disp: , Rfl:  Red Yeast Rice 600 MG CAPS, Take 2 capsules by mouth 2 (two) times daily.  , Disp: , Rfl:  No current facility-administered medications for this visit. Facility-Administered Medications Ordered in Other Visits: influenza  inactive virus vaccine (FLUZONE/FLUARIX) injection 0.5 mL, 0.5 mL, Intramuscular, Once, Joselyn Arrow, MD  Allergies  Allergen Reactions  . Levaquin (Levofloxacin Hemihydrate) Other (See Comments)  . Promethazine Hcl Other (See Comments)  . Sulfa Drugs Cross  Reactors Swelling   ROS: Denies fevers, URI symptoms, cough, shortness of breath, chest pain.  Denies GI complaints.  Rare heartburn, controlled with prilosec.  No bowel changes.  Some mild overactive bladder.  PHYSICAL EXAM: BP 110/74  Pulse 80  Ht 5\' 6"  (1.676 m)  Wt 222 lb (100.699 kg)  BMI 35.83 kg/m2 Well developed, pleasant, overweight female in no distress Heart: regular rate and rhythm Lungs: clear Skin: no rash, bruising.  Some superficial veins/capillaries at L knee superomedially Extremities:  No effusion, warmth.  Some crepitus and increased mobility of patella at L knee compared to right.  FROM.  No joint line tenderness.  Negative Lachman, McMurray.  No pain with varus/valgus stretch.  No focal tenderness or swelling posteriorly.  Some discomfort with  hamstring stretch.  Strength 5/5, normal sensation  Area of discomfort is distal hamstring, laterally.  ASSESSMENT/PLAN:  1. Knee pain, left  naproxen (NAPROSYN) 500 MG tablet  suspect hamstring strain. No evidence of internal derangement of knee.  Likely has some underlying arthritis, but I don't think that is related to her current complaints of pain.  NSAID precautions/risks reviewed.  Use NSAID x 7-10 days, up to full rx if needed to get complete resolution of pain.  Plans to get massage today (does monthly) Heat TID, stretches shown  F/u if worsening pain, instability, falls, weakness, effusion/swelling or other problems develop.  F/u as scheduled, sooner prn

## 2012-08-05 NOTE — Patient Instructions (Addendum)
Heat, stretches, massage. Take anti-inflammatory with food twice daily until knee is better. Return if you develop swelling, increased pain, giving way of the knee. Stop or decrease the dose of anti-inflammatory if it causes abdominal pain.  Do not take other aleve, ibuprofen, Goody/BC powder or other pain medications while taking the prescription.  Acetaminophen (Tylenol) is okay to take with it.

## 2012-09-13 ENCOUNTER — Other Ambulatory Visit: Payer: 59

## 2012-09-13 DIAGNOSIS — E7800 Pure hypercholesterolemia, unspecified: Secondary | ICD-10-CM

## 2012-09-13 LAB — HEPATIC FUNCTION PANEL
ALT: 9 U/L (ref 0–35)
AST: 18 U/L (ref 0–37)
Alkaline Phosphatase: 44 U/L (ref 39–117)
Bilirubin, Direct: 0.1 mg/dL (ref 0.0–0.3)
Indirect Bilirubin: 0.3 mg/dL (ref 0.0–0.9)

## 2012-09-13 LAB — LIPID PANEL
Cholesterol: 161 mg/dL (ref 0–200)
Triglycerides: 77 mg/dL (ref <150)
VLDL: 15 mg/dL (ref 0–40)

## 2012-09-15 ENCOUNTER — Ambulatory Visit (INDEPENDENT_AMBULATORY_CARE_PROVIDER_SITE_OTHER): Payer: 59 | Admitting: Family Medicine

## 2012-09-15 ENCOUNTER — Encounter: Payer: Self-pay | Admitting: Family Medicine

## 2012-09-15 VITALS — BP 130/70 | HR 76 | Ht 67.0 in | Wt 218.0 lb

## 2012-09-15 DIAGNOSIS — M8580 Other specified disorders of bone density and structure, unspecified site: Secondary | ICD-10-CM

## 2012-09-15 DIAGNOSIS — E039 Hypothyroidism, unspecified: Secondary | ICD-10-CM

## 2012-09-15 DIAGNOSIS — Z0000 Encounter for general adult medical examination without abnormal findings: Secondary | ICD-10-CM

## 2012-09-15 DIAGNOSIS — M25562 Pain in left knee: Secondary | ICD-10-CM | POA: Insufficient documentation

## 2012-09-15 DIAGNOSIS — M25569 Pain in unspecified knee: Secondary | ICD-10-CM

## 2012-09-15 DIAGNOSIS — M949 Disorder of cartilage, unspecified: Secondary | ICD-10-CM

## 2012-09-15 DIAGNOSIS — M899 Disorder of bone, unspecified: Secondary | ICD-10-CM

## 2012-09-15 DIAGNOSIS — Z79899 Other long term (current) drug therapy: Secondary | ICD-10-CM

## 2012-09-15 DIAGNOSIS — E7800 Pure hypercholesterolemia, unspecified: Secondary | ICD-10-CM

## 2012-09-15 LAB — POCT URINALYSIS DIPSTICK
Bilirubin, UA: NEGATIVE
Ketones, UA: NEGATIVE
Leukocytes, UA: NEGATIVE
Spec Grav, UA: 1.015
pH, UA: 5

## 2012-09-15 NOTE — Progress Notes (Addendum)
Chief Complaint  Patient presents with  . Annual Exam    annual exam with pap, labs already done. UA showed trace blood, pt asymptomatic. She does have a nerve in the back of her left knee that has been bothering her-you have seen her in the past for this.   Kayla Guerrero is a 77 y.o. female who presents for a complete physical.  She has the following concerns:  Ongoing pain in L knee since November.  Tried Naproxen, massages, soaking in baths, but no improvement.  Hurts with just certain movements.  No pain with weight-bearing, but hurts with going up and down stairs.  Never had x-rays. Has pain posteriorly.  Hyperlipidemia--taking red yeast rice.  Denies side effects.  Trying to follow low cholesterol diet.  Immunization History  Administered Date(s) Administered  . H1N1 06/21/2009  . Influenza Split 06/19/2011, 06/22/2012  . Influenza Whole 07/12/2010  . Pneumococcal Polysaccharide 08/20/2010  . Tdap 12/12/2010  . Zoster 09/30/2011  she reports having gotten zostavax in January 2013 Last Pap smear: 07/2010 with Dr. Zenaida Niece Last mammogram: scheduled next month, had one 1 year ago, 09/2011 Last colonoscopy: 2010 with Dr. Matthias Hughs Last DEXA: 11/2010 Dentist: every 6 months  Ophtho: yearly Exercise: water exercise 3 times/week, some walking Sees dermatologist for routine skin checks She has living will, healthcare power of attorney but plans on changing it.  Past Medical History  Diagnosis Date  . GERD (gastroesophageal reflux disease)   . Obesity   . Borderline osteopenia DEXA 2006 and 2012  . Cholesterol serum elevated   . FHx: colon cancer   . Colon polyps   . Cataract     Past Surgical History  Procedure Date  . Cholecystectomy   . Tubal ligation   . Colonoscopy 2010    Buccini  . Cataract extraction, bilateral   . Pterygium excision     History   Social History  . Marital Status: Married    Spouse Name: N/A    Number of Children: 1  . Years of Education: N/A    Occupational History  . retired Education officer, community); Artist    Social History Main Topics  . Smoking status: Former Smoker    Quit date: 09/29/1980  . Smokeless tobacco: Never Used  . Alcohol Use: Yes     Comment: 1-3 drinks/week  . Drug Use: No  . Sexually Active: Not on file   Other Topics Concern  . Not on file   Social History Narrative   Lives with husband.  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 Son lives in [place], getting divorced    Family History  Problem Relation Age of Onset  . Cancer Mother 34    COLON   . Heart disease Mother   . Mental illness Maternal Uncle   . Cancer Maternal Grandmother     stomach  . Macular degeneration Maternal Grandmother   . Diabetes Neg Hx     Current outpatient prescriptions:calcium carbonate (OS-CAL) 600 MG TABS, Take 600 mg by mouth daily.  , Disp: , Rfl: ;  Cholecalciferol (VITAMIN D) 1000 UNITS capsule, Take 1,000 Units by mouth daily.  , Disp: , Rfl: ;  fish oil-omega-3 fatty acids 1000 MG capsule, Take 1 g by mouth daily. , Disp: , Rfl: ;  glucosamine-chondroitin 500-400 MG tablet, Take 1 tablet by mouth 2 (two) times daily. , Disp: , Rfl:  levothyroxine (  SYNTHROID, LEVOTHROID) 75 MCG tablet, Take 1 tablet (75 mcg total) by mouth daily., Disp: 90 tablet, Rfl: 3;  Magnesium 400 MG CAPS, Take 1 capsule by mouth daily.  , Disp: , Rfl: ;  Multiple Vitamin (MULTI-VITAMIN) tablet, Take 1 tablet by mouth daily.  , Disp: , Rfl: ;  Red Yeast Rice 600 MG CAPS, Take 2 capsules by mouth 2 (two) times daily.  , Disp: , Rfl:  naproxen (NAPROSYN) 500 MG tablet, Take 1 tablet (500 mg total) by mouth 2 (two) times daily with a meal., Disp: 30 tablet, Rfl: 0;  omeprazole (PRILOSEC OTC) 20 MG tablet, Take 20 mg by mouth daily.  , Disp: , Rfl:  No current facility-administered medications for this visit. Facility-Administered Medications Ordered in  Other Visits: influenza  inactive virus vaccine (FLUZONE/FLUARIX) injection 0.5 mL, 0.5 mL, Intramuscular, Once, Joselyn Arrow, MD  Allergies  Allergen Reactions  . Levaquin (Levofloxacin Hemihydrate) Other (See Comments)  . Promethazine Hcl Other (See Comments)  . Sulfa Drugs Cross Reactors Swelling   ROS: The patient denies anorexia, fever, headaches, vision changes, decreased hearing, ear pain, sore throat, breast concerns, chest pain, palpitations, dizziness, syncope, dyspnea on exertion, cough, swelling, nausea, vomiting, diarrhea, constipation, abdominal pain, melena, hematochezia, indigestion/heartburn, hematuria, incontinence, dysuria, vaginal bleeding, discharge, odor or itch, genital lesions, numbness, tingling, weakness, tremor, suspicious skin lesions, depression, anxiety, abnormal bleeding/bruising, or enlarged lymph nodes.  +decreased libido (stable, unchanged);   +L knee pain--see HPI . +memory troubles (unimportant things, nothing significant, unchanged) +weight gain--she thinks related to poor diet, ice cream in the summertime.  PHYSICAL EXAM: BP 130/70  Pulse 76  Ht 5\' 7"  (1.702 m)  Wt 218 lb (98.884 kg)  BMI 34.14 kg/m2 130/70 on repeat by MD  General Appearance:  Alert, cooperative, no distress, appears stated age   Head:  Normocephalic, without obvious abnormality, atraumatic   Eyes:  PERRL, conjunctiva/corneas clear, EOM's intact, fundi  benign   Ears:  Normal TM's and external ear canals   Nose:  Nares normal, mucosa normal, no drainage or sinus tenderness   Throat:  Lips, mucosa, and tongue normal; teeth and gums normal   Neck:  Supple, no lymphadenopathy; thyroid: no enlargement/tenderness/nodules; no carotid  bruit or JVD   Back:  Spine nontender, no curvature, ROM normal, no CVA tenderness   Lungs:  Clear to auscultation bilaterally without wheezes, rales or ronchi; respirations unlabored   Chest Wall:  No tenderness or deformity   Heart:  Regular rate and  rhythm, S1 and S2 normal, no murmur, rub  or gallop   Breast Exam:  No tenderness, masses, or nipple discharge or inversion. No axillary lymphadenopathy   Abdomen:  Soft, non-tender, nondistended, normoactive bowel sounds,  no masses, no hepatosplenomegaly   Genitalia:  Normal external genitalia without lesions. BUS and vagina normal; no cervical motion tenderness. No abnormal vaginal discharge. +atrophic changes. Uterus and adnexa not enlarged, nontender, no masses. Pap not performed   Rectal:  Normal tone, no masses or tenderness; guaiac negative stool; +external hemorrhoids   Extremities:  No clubbing, cyanosis or edema. See below for knee exam  Pulses:  2+ and symmetric all extremities   Skin:  Skin color, texture, turgor normal, no rashes or lesions.   Lymph nodes:  Cervical, supraclavicular, and axillary nodes normal   Neurologic:  CNII-XII intact, normal strength, sensation and gait; reflexes 2+ and symmetric throughout   Psych: Normal mood, affect, hygiene and grooming.   L knee--mild effusion.  FROM without crepitus.  No pain with valgus/varus stress.  Normal McMurray, Lachman.  Pain over medial joint line and patellar tendon (slight).  Lab Results  Component Value Date   CHOL 161 09/13/2012   HDL 55 09/13/2012   LDLCALC 91 09/13/2012   TRIG 77 09/13/2012   CHOLHDL 2.9 09/13/2012   Lab Results  Component Value Date   ALT 9 09/13/2012   AST 18 09/13/2012   ALKPHOS 44 09/13/2012   BILITOT 0.4 09/13/2012   ASSESSMENT/PLAN:  1. Routine general medical examination at a health care facility  POCT Urinalysis Dipstick, Visual acuity screening  2. Unspecified hypothyroidism    3. Pure hypercholesterolemia    4. Left knee pain  DG Knee Complete 4 Views Left  5. Osteopenia     L knee pain--likely OA.  Check x-ray.  Consider cortisone shot vs ortho referral. Weight loss will help.  Continue water aerobics.  Prefers PT over injection.  Refer after x-rays reviewed Await x-ray  results  DEXA next year.  Continue calcium, vitamin D, weight-bearing exercise

## 2012-09-15 NOTE — Patient Instructions (Addendum)
HEALTH MAINTENANCE RECOMMENDATIONS:  It is recommended that you get at least 30 minutes of aerobic exercise at least 5 days/week (for weight loss, you may need as much as 60-90 minutes). This can be any activity that gets your heart rate up. This can be divided in 10-15 minute intervals if needed, but try and build up your endurance at least once a week.  Weight bearing exercise is also recommended twice weekly.  Eat a healthy diet with lots of vegetables, fruits and fiber.  "Colorful" foods have a lot of vitamins (ie green vegetables, tomatoes, red peppers, etc).  Limit sweet tea, regular sodas and alcoholic beverages, all of which has a lot of calories and sugar.  Up to 1 alcoholic drink daily may be beneficial for women (unless trying to lose weight, watch sugars).  Drink a lot of water.  Calcium recommendations are 1200-1500 mg daily (1500 mg for postmenopausal women or women without ovaries), and vitamin D 1000 IU daily.  This should be obtained from diet and/or supplements (vitamins), and calcium should not be taken all at once, but in divided doses.  Monthly self breast exams and yearly mammograms for women over the age of 50 is recommended.  Sunscreen of at least SPF 30 should be used on all sun-exposed parts of the skin when outside between the hours of 10 am and 4 pm (not just when at beach or pool, but even with exercise, golf, tennis, and yard work!)  Use a sunscreen that says "broad spectrum" so it covers both UVA and UVB rays, and make sure to reapply every 1-2 hours.  Remember to change the batteries in your smoke detectors when changing your clock times in the spring and fall.  Use your seat belt every time you are in a car, and please drive safely and not be distracted with cell phones and texting while driving.   Use Tylenol as needed for pain. Get xray of knee at [facility]

## 2012-09-16 ENCOUNTER — Ambulatory Visit
Admission: RE | Admit: 2012-09-16 | Discharge: 2012-09-16 | Disposition: A | Discharge status: Home / Self Care | Payer: 59 | Source: Ambulatory Visit | Attending: Family Medicine | Admitting: Family Medicine

## 2012-09-16 DIAGNOSIS — M25562 Pain in left knee: Secondary | ICD-10-CM

## 2012-09-30 ENCOUNTER — Ambulatory Visit: Payer: Medicare Other | Attending: Family Medicine | Admitting: Physical Therapy

## 2012-09-30 DIAGNOSIS — M6281 Muscle weakness (generalized): Secondary | ICD-10-CM | POA: Insufficient documentation

## 2012-09-30 DIAGNOSIS — R293 Abnormal posture: Secondary | ICD-10-CM | POA: Insufficient documentation

## 2012-09-30 DIAGNOSIS — IMO0001 Reserved for inherently not codable concepts without codable children: Secondary | ICD-10-CM | POA: Insufficient documentation

## 2012-09-30 DIAGNOSIS — M25569 Pain in unspecified knee: Secondary | ICD-10-CM | POA: Insufficient documentation

## 2012-10-05 ENCOUNTER — Ambulatory Visit: Payer: Medicare Other | Admitting: Rehabilitation

## 2012-10-07 ENCOUNTER — Ambulatory Visit: Payer: Medicare Other | Admitting: Rehabilitation

## 2012-10-12 ENCOUNTER — Ambulatory Visit: Payer: Medicare Other | Admitting: Physical Therapy

## 2012-10-12 ENCOUNTER — Encounter: Payer: 59 | Admitting: Physical Therapy

## 2012-10-14 ENCOUNTER — Ambulatory Visit: Payer: Medicare Other | Admitting: Physical Therapy

## 2012-10-19 ENCOUNTER — Ambulatory Visit: Payer: Medicare Other | Admitting: Physical Therapy

## 2012-10-21 ENCOUNTER — Ambulatory Visit: Payer: Medicare Other | Admitting: Physical Therapy

## 2012-10-26 ENCOUNTER — Ambulatory Visit: Payer: Medicare Other | Admitting: Physical Therapy

## 2012-10-27 ENCOUNTER — Other Ambulatory Visit: Payer: Self-pay | Admitting: Family Medicine

## 2012-10-27 DIAGNOSIS — Z1231 Encounter for screening mammogram for malignant neoplasm of breast: Secondary | ICD-10-CM

## 2012-10-28 ENCOUNTER — Ambulatory Visit: Payer: Medicare Other | Admitting: Physical Therapy

## 2012-11-02 ENCOUNTER — Ambulatory Visit: Payer: Medicare Other | Attending: Family Medicine | Admitting: Physical Therapy

## 2012-11-02 DIAGNOSIS — M6281 Muscle weakness (generalized): Secondary | ICD-10-CM | POA: Insufficient documentation

## 2012-11-02 DIAGNOSIS — R293 Abnormal posture: Secondary | ICD-10-CM | POA: Insufficient documentation

## 2012-11-02 DIAGNOSIS — M25569 Pain in unspecified knee: Secondary | ICD-10-CM | POA: Insufficient documentation

## 2012-11-02 DIAGNOSIS — IMO0001 Reserved for inherently not codable concepts without codable children: Secondary | ICD-10-CM | POA: Insufficient documentation

## 2012-11-04 ENCOUNTER — Ambulatory Visit: Payer: Medicare Other | Admitting: Physical Therapy

## 2012-11-09 ENCOUNTER — Ambulatory Visit: Payer: Medicare Other | Admitting: Physical Therapy

## 2012-11-11 ENCOUNTER — Ambulatory Visit: Payer: Medicare Other | Admitting: Physical Therapy

## 2012-11-16 ENCOUNTER — Ambulatory Visit: Payer: Medicare Other | Admitting: Physical Therapy

## 2012-11-23 ENCOUNTER — Ambulatory Visit: Payer: Medicare Other | Admitting: Physical Therapy

## 2012-11-25 ENCOUNTER — Encounter: Payer: 59 | Admitting: Physical Therapy

## 2012-11-25 ENCOUNTER — Ambulatory Visit: Payer: 59

## 2012-12-06 ENCOUNTER — Ambulatory Visit: Payer: Medicare Other | Attending: Family Medicine | Admitting: Physical Therapy

## 2012-12-06 DIAGNOSIS — M6281 Muscle weakness (generalized): Secondary | ICD-10-CM | POA: Insufficient documentation

## 2012-12-06 DIAGNOSIS — R293 Abnormal posture: Secondary | ICD-10-CM | POA: Insufficient documentation

## 2012-12-06 DIAGNOSIS — IMO0001 Reserved for inherently not codable concepts without codable children: Secondary | ICD-10-CM | POA: Insufficient documentation

## 2012-12-06 DIAGNOSIS — M25569 Pain in unspecified knee: Secondary | ICD-10-CM | POA: Insufficient documentation

## 2012-12-07 ENCOUNTER — Ambulatory Visit: Payer: Medicare Other | Admitting: Physical Therapy

## 2012-12-15 ENCOUNTER — Ambulatory Visit: Payer: Medicare Other | Admitting: Physical Therapy

## 2012-12-16 ENCOUNTER — Ambulatory Visit: Payer: Medicare Other | Admitting: Physical Therapy

## 2012-12-19 DIAGNOSIS — H3530 Unspecified macular degeneration: Secondary | ICD-10-CM | POA: Insufficient documentation

## 2012-12-20 ENCOUNTER — Ambulatory Visit
Admission: RE | Admit: 2012-12-20 | Discharge: 2012-12-20 | Disposition: A | Discharge status: Home / Self Care | Payer: Medicare Other | Source: Ambulatory Visit | Attending: Family Medicine | Admitting: Family Medicine

## 2012-12-20 DIAGNOSIS — Z1231 Encounter for screening mammogram for malignant neoplasm of breast: Secondary | ICD-10-CM

## 2012-12-20 LAB — HM MAMMOGRAPHY: HM Mammogram: NORMAL

## 2012-12-21 ENCOUNTER — Ambulatory Visit: Payer: Medicare Other | Admitting: Physical Therapy

## 2012-12-23 ENCOUNTER — Encounter: Payer: 59 | Admitting: Physical Therapy

## 2013-01-19 ENCOUNTER — Ambulatory Visit: Payer: Self-pay | Admitting: Family Medicine

## 2013-01-20 ENCOUNTER — Encounter: Payer: Self-pay | Admitting: Family Medicine

## 2013-01-20 ENCOUNTER — Ambulatory Visit (INDEPENDENT_AMBULATORY_CARE_PROVIDER_SITE_OTHER): Payer: Medicare Other | Admitting: Family Medicine

## 2013-01-20 VITALS — BP 122/80 | HR 76 | Ht 66.5 in | Wt 221.0 lb

## 2013-01-20 DIAGNOSIS — M25569 Pain in unspecified knee: Secondary | ICD-10-CM

## 2013-01-20 DIAGNOSIS — M25562 Pain in left knee: Secondary | ICD-10-CM

## 2013-01-20 MED ORDER — NAPROXEN 500 MG PO TABS: 500.0000 mg | ORAL_TABLET | Freq: Two times a day (BID) | ORAL | Status: DC

## 2013-01-20 NOTE — Progress Notes (Signed)
I have faxed over office notes to [facility] Dr. Ollen Gross @ [phone]

## 2013-01-20 NOTE — Progress Notes (Signed)
Chief Complaint  Patient presents with  . knee pain    knee pain gotten worse   She finished PT about a month ago.  Felt like it was somewhat helpful while she was doing the therapy.  She admits to doing just some of the home exercises, but not all.  Since stopping PT she finds that her knee pain has gotten worse, more generalized.  Initially pain was posterior, hamstring,and now she is having pain everywhere.  Denies giving way or locking.  Notices some clicking occasionally of kneecap.  She thinks there is some mild chronic swelling.  She has taken about 4 tablets of Aleve in the last week, which seemed to help.    Past Medical History  Diagnosis Date  . GERD (gastroesophageal reflux disease)   . Obesity   . Borderline osteopenia DEXA 2006 and 2012  . Cholesterol serum elevated   . FHx: colon cancer   . Colon polyps   . Cataract    Past Surgical History  Procedure Laterality Date  . Cholecystectomy    . Tubal ligation    . Colonoscopy  2010    Buccini  . Cataract extraction, bilateral    . Pterygium excision     History   Social History  . Marital Status: Married    Spouse Name: N/A    Number of Children: 1  . Years of Education: N/A   Occupational History  . retired Education officer, community); Artist    Social History Main Topics  . Smoking status: Former Smoker    Quit date: 09/29/1980  . Smokeless tobacco: Never Used  . Alcohol Use: Yes     Comment: 1-3 drinks/week  . Drug Use: No  . Sexually Active: Not on file   Other Topics Concern  . Not on file   Social History Narrative   Lives with husband.  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 Son lives in [place], getting divorced   Current Outpatient Prescriptions on File Prior to Visit  Medication Sig Dispense Refill  . calcium carbonate (OS-CAL) 600 MG TABS Take 600 mg by mouth daily.        . Cholecalciferol (VITAMIN  D) 1000 UNITS capsule Take 1,000 Units by mouth daily.        . fish oil-omega-3 fatty acids 1000 MG capsule Take 1 g by mouth daily.       [name] glucosamine-chondroitin 500-400 MG tablet Take 1 tablet by mouth 2 (two) times daily.       [name] levothyroxine (SYNTHROID, LEVOTHROID) 75 MCG tablet Take 1 tablet (75 mcg total) by mouth daily.  90 tablet  3  . Magnesium 400 MG CAPS Take 1 capsule by mouth daily.        . Multiple Vitamin (MULTI-VITAMIN) tablet Take 1 tablet by mouth daily.        [name] omeprazole (PRILOSEC OTC) 20 MG tablet Take 20 mg by mouth daily.        . Red Yeast Rice 600 MG CAPS Take 2 capsules by mouth 2 (two) times daily.         Current Facility-Administered Medications on File Prior to Visit  Medication Dose Route Frequency Provider Last Rate Last Dose  . influenza  inactive virus vaccine (FLUZONE/FLUARIX) injection 0.5 mL  0.5 mL Intramuscular Once Joselyn Arrow, MD       Allergies  Allergen Reactions  . Levaquin (Levofloxacin Hemihydrate)  Other (See Comments)  . Promethazine Hcl Other (See Comments)  . Sulfa Drugs Cross Reactors Swelling   ROS:  Denies fevers, URI symptoms, bleeding/bruising, GI complaints, or other significant joint pains  PHYSICAL EXAM: BP 122/80  Pulse 76  Ht 5' 6.5" (1.689 m)  Wt 221 lb (100.245 kg)  BMI 35.14 kg/m2  Well developed, pleasant female in no distress L knee: No significant effusion.  No crepitus.  Tender at medial joint line.  No pain with varus/valgus stress, negative Lachman, McMurray.  Ligaments intact with good endpoints.  Had some sporadic pain during exam (had sharp pain laterally/posteriorly at knee with valgus stress, but not on repeat testing, just once).  Complains of burning pain laterally over fibula and into lower leg, but nontender on exam.  xrays from 08/2012: Degenerative change primarily involving the lateral compartment  with some sclerosis which may indicate prior contusion of the left  lateral tibial plateau. Consider  MRI to assess more sensitively.  ASSESSMENT/PLAN: Left knee pain - Plan: Ambulatory referral to Orthopedic Surgery  Knee pain, left - Plan: naproxen (NAPROSYN) 500 MG tablet  Left knee pain.  Did not resolve with PT.  Some known underlying degenerative changes, and h/o injury in past (fall in [place]).  Course of NSAIDs, to be used just short term. Discussed tylenol for more longterm pain.  Will refer to ortho for evaluation.  Pt has stopped her red yeast rice.  Has upcoming appointment for med check/labs

## 2013-01-20 NOTE — Patient Instructions (Addendum)
Take the naproxen twice daily with food for at least a week (up to 15 days).  Take prilosec OTC daily while on this medication.  STOP using Aleve while taking this. After finishing the prescription, try Tylenol Arthritis for pain relief, as this is safer in the longterm than anti-inflammatories.  We are referring you to Dr. Cherlyn Roberts likely has a long wait.  If your knee pain is worse, you can return here for cortisone shot, or be seen by someone else within the orthopedic office sooner

## 2013-02-15 ENCOUNTER — Other Ambulatory Visit: Payer: Medicare Other

## 2013-02-15 DIAGNOSIS — Z79899 Other long term (current) drug therapy: Secondary | ICD-10-CM

## 2013-02-15 DIAGNOSIS — E7800 Pure hypercholesterolemia, unspecified: Secondary | ICD-10-CM

## 2013-02-15 DIAGNOSIS — E039 Hypothyroidism, unspecified: Secondary | ICD-10-CM

## 2013-02-15 LAB — CBC WITH DIFFERENTIAL/PLATELET
Basophils Absolute: 0.1 10*3/uL (ref 0.0–0.1)
Lymphocytes Relative: 40 % (ref 12–46)
Neutro Abs: 2.6 10*3/uL (ref 1.7–7.7)
Platelets: 299 10*3/uL (ref 150–400)
RBC: 4.89 MIL/uL (ref 3.87–5.11)
RDW: 14.6 % (ref 11.5–15.5)
WBC: 5.5 10*3/uL (ref 4.0–10.5)

## 2013-02-15 LAB — COMPREHENSIVE METABOLIC PANEL
BUN: 16 mg/dL (ref 6–23)
CO2: 29 mEq/L (ref 19–32)
Creat: 0.93 mg/dL (ref 0.50–1.10)
Glucose, Bld: 89 mg/dL (ref 70–99)
Total Bilirubin: 0.5 mg/dL (ref 0.3–1.2)
Total Protein: 6.7 g/dL (ref 6.0–8.3)

## 2013-02-15 LAB — LIPID PANEL
Cholesterol: 198 mg/dL (ref 0–200)
HDL: 55 mg/dL (ref >39)
Triglycerides: 105 mg/dL (ref <150)

## 2013-02-15 LAB — TSH: TSH: 3.548 u[IU]/mL (ref 0.350–4.500)

## 2013-02-16 ENCOUNTER — Ambulatory Visit (INDEPENDENT_AMBULATORY_CARE_PROVIDER_SITE_OTHER): Payer: Medicare Other | Admitting: Family Medicine

## 2013-02-16 ENCOUNTER — Encounter: Payer: Self-pay | Admitting: Family Medicine

## 2013-02-16 VITALS — BP 128/78 | HR 80 | Temp 97.4°F | Ht 66.5 in | Wt 217.0 lb

## 2013-02-16 DIAGNOSIS — M25569 Pain in unspecified knee: Secondary | ICD-10-CM

## 2013-02-16 DIAGNOSIS — E039 Hypothyroidism, unspecified: Secondary | ICD-10-CM

## 2013-02-16 DIAGNOSIS — H6121 Impacted cerumen, right ear: Secondary | ICD-10-CM

## 2013-02-16 DIAGNOSIS — E669 Obesity, unspecified: Secondary | ICD-10-CM

## 2013-02-16 DIAGNOSIS — E7800 Pure hypercholesterolemia, unspecified: Secondary | ICD-10-CM

## 2013-02-16 DIAGNOSIS — M25562 Pain in left knee: Secondary | ICD-10-CM

## 2013-02-16 DIAGNOSIS — H6120 Impacted cerumen, unspecified ear: Secondary | ICD-10-CM

## 2013-02-16 MED ORDER — LEVOTHYROXINE SODIUM 75 MCG PO TABS: 75.0000 ug | ORAL_TABLET | Freq: Every day | ORAL | Status: DC

## 2013-02-16 MED ORDER — NAPROXEN 500 MG PO TABS: 500.0000 mg | ORAL_TABLET | Freq: Two times a day (BID) | ORAL | Status: DC

## 2013-02-16 NOTE — Progress Notes (Signed)
Chief Complaint  Patient presents with  . Med check    nonfasting med check,labs done yesterday. L knee pain, also she is not seeing very well would like to change eye doctor. And she has "cobwebs" in her ears. Just getting over a cold.    Hyperlipidemia follow-up:  Patient is reportedly following a low-fat, low cholesterol diet.  She stopped taking the red yeast rice about 2-3 months ago, when she thought there was a possibility that it was contributing to her pain in her left leg/knee.  She now realizes that it didn't seem to make a difference, as she is still having a lot of pain. Presents to f/u on labs done yesterday, recheck of lipids off the RYR.  L knee pain--affecting lateral and posterior knee, and coming down to lateral upper portion of calf. She took the Naproxyn, which must have helped some, since knee pain has been worse since stopping it.  She didn't have any side effects from medication.  She never really tried the Tylenol Arthritis, but used some Tylenol PM last week when had a cold.  Has appt with Dr. Lequita Halt 6/10.  Does water aerobics 3x/week  Hypothyroidism--denies any significant change in energy, bowels, hair.  Always has slight dry skin, constipation, hair thinning, but no recent change.  Denies any depression, but some anxiety related to his son's divorce.  She has cut back on sweets a lot, but admits to loving carbs.  Eyesight is getting worse.  Dr. Nile Riggs sent her to a retinal specialist, who told her she has mild macular degeneration, but told her that really she just needs "better glasses".  She has an appointment with Dr. Elmer Picker and Dr. Blima Ledger (optometrist) and she is asking my opinion/advice re: her next step and recommendations for eye doctors.  Ears feel like they have cobwebs, "feelings" like there is something in there.  Symptoms started before her recent URI. Right ear seems to be more affected than her left.  Denies tinnitus or significant decrease in  hearing.  Denies pain.  URI symptoms are resolving.  Past Medical History  Diagnosis Date  . GERD (gastroesophageal reflux disease)   . Obesity   . Borderline osteopenia DEXA 2006 and 2012  . Cholesterol serum elevated   . FHx: colon cancer   . Colon polyps   . Cataract   . Arthritis of knee, left    Past Surgical History  Procedure Laterality Date  . Cholecystectomy    . Tubal ligation    . Colonoscopy  2010    Buccini  . Cataract extraction, bilateral    . Pterygium excision     History   Social History  . Marital Status: Married    Spouse Name: N/A    Number of Children: 1  . Years of Education: N/A   Occupational History  . retired Education officer, community); Artist    Social History Main Topics  . Smoking status: Former Smoker    Quit date: 09/29/1980  . Smokeless tobacco: Never Used  . Alcohol Use: Yes     Comment: 1-3 drinks/week  . Drug Use: No  . Sexually Active: Not on file   Other Topics Concern  . Not on file   Social History Narrative   Lives with husband.  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 Son lives in [place], getting divorced    Current outpatient prescriptions:calcium carbonate (OS-CAL) 600  MG TABS, Take 600 mg by mouth daily.  , Disp: , Rfl: ;  Cholecalciferol (VITAMIN D) 1000 UNITS capsule, Take 1,000 Units by mouth daily.  , Disp: , Rfl: ;  fish oil-omega-3 fatty acids 1000 MG capsule, Take 1 g by mouth daily. , Disp: , Rfl: ;  glucosamine-chondroitin 500-400 MG tablet, Take 1 tablet by mouth 2 (two) times daily. , Disp: , Rfl:  levothyroxine (SYNTHROID, LEVOTHROID) 75 MCG tablet, Take 1 tablet (75 mcg total) by mouth daily., Disp: 90 tablet, Rfl: 3;  Magnesium 400 MG CAPS, Take 1 capsule by mouth daily.  , Disp: , Rfl: ;  Multiple Vitamin (MULTI-VITAMIN) tablet, Take 1 tablet by mouth daily.  , Disp: , Rfl: ;  naproxen (NAPROSYN) 500 MG tablet, Take 1  tablet (500 mg total) by mouth 2 (two) times daily with a meal., Disp: 30 tablet, Rfl: 0 omeprazole (PRILOSEC OTC) 20 MG tablet, Take 20 mg by mouth daily.  , Disp: , Rfl:  No current facility-administered medications for this visit. Facility-Administered Medications Ordered in Other Visits: influenza  inactive virus vaccine (FLUZONE/FLUARIX) injection 0.5 mL, 0.5 mL, Intramuscular, Once, Joselyn Arrow, MD  (not taking Naprosyn, rarely using omeprazole)  Allergies  Allergen Reactions  . Levaquin (Levofloxacin Hemihydrate) Other (See Comments)  . Promethazine Hcl Other (See Comments)  . Sulfa Drugs Cross Reactors Swelling   ROS: Down 4 pounds since last visit.  Recent URI, improving.  Some residual cough and congestion. No fevers, discolored mucus. Denies bleeding/bruising, abdominal pain, chest pain, shortness of breath, edema. See HPI  PHYSICAL EXAM: BP 128/78  Pulse 80  Temp(Src) 97.4 F (36.3 C) (Oral)  Ht 5' 6.5" (1.689 m)  Wt 217 lb (98.431 kg)  BMI 34.5 kg/m2  Well developed, pleasant obese female in no distress.  Occasional sniffle HEENT:  Conjunctiva and sclera are clear.  TM's and EAC's are normal.  Moderate amount of hard cerumen in right ear, nonocclusive. OP clear.  Sinuses nontender Neck: no lymphadenopathy, thyromegaly or mass Heart: regular rate and rhythm without murmur Lungs: clear bilaterally Extremities: no edema Skin: no rash Psych: normal mood, affect, hygiene and grooming   Lab Results  Component Value Date   CHOL 198 02/15/2013   HDL 55 02/15/2013   LDLCALC 122* 02/15/2013   TRIG 105 02/15/2013   CHOLHDL 3.6 02/15/2013     Chemistry      Component Value Date/Time   NA 141 02/15/2013 0846   K 4.4 02/15/2013 0846   CL 106 02/15/2013 0846   CO2 29 02/15/2013 0846   BUN 16 02/15/2013 0846   CREATININE 0.93 02/15/2013 0846      Component Value Date/Time   CALCIUM 8.9 02/15/2013 0846   ALKPHOS 50 02/15/2013 0846   AST 18 02/15/2013 0846   ALT 8 02/15/2013 0846    BILITOT 0.5 02/15/2013 0846     Lab Results  Component Value Date   WBC 5.5 02/15/2013   HGB 13.8 02/15/2013   HCT 41.5 02/15/2013   MCV 84.9 02/15/2013   PLT 299 02/15/2013   Lab Results  Component Value Date   TSH 3.548 02/15/2013    ASSESSMENT/PLAN:   Hypothyroidism--There has been a gradual increase in TSH, although still WNL. Continue current dose and recheck TSH in 6 months, sooner if symptoms develop/worsen.  These symptoms were reviewed in detail.  Hyperlipidemia--some increase in LDL since stopping the red yeast rice, however she is still at goal without medication.  I do not recommend restarting the  red yeast rice at this point. Continue low fat, low cholesterol diet.  Plan to recheck in 6 months.  L knee pain--Encouraged a trial of Tylenol Arthritis, as this is safer for more chronic use.  If, however, it isn't effective, okay to go back to using the prescription naproxen until your visit with orthopedist next month.  Make sure to use the Prilosec daily while taking the naproxen to prevent ulcers. Follow up with Dr. Lequita Halt as scheduled.  Continue water aerobics.  Try and lose weight--diet reviewed in detailed (cutting back on carbs, portion control).  "sensation" in her right > left ear may be due to cerumen.  Lavage was performed to right ear, and large amount of hard wax was removed.  Pt's symptoms resolved after procedure, tolerated without complication (just minimal discomfort).   F/u in 6 months with labs prior to CPE (to include lipid and TSH, glu)

## 2013-02-16 NOTE — Patient Instructions (Addendum)
  Encouraged a trial of Tylenol Arthritis, as this is safer for more chronic use.  If, however, it isn't effective, okay to go back to using the prescription naproxen until your visit with orthopedist next month.  Make sure to use the Prilosec daily while taking the naproxen to prevent ulcers.  Continue weight loss--since your exercise is somewhat limited, try and do it more with diet.  ie--cutting back on carbs, portion control.  Follow up with Dr. Elmer Picker (or Dr. Dagoberto Ligas was the other person I like a lot) for your eyes/decreased vision.

## 2013-03-11 ENCOUNTER — Other Ambulatory Visit: Payer: Self-pay | Admitting: *Deleted

## 2013-03-11 ENCOUNTER — Telehealth: Payer: Self-pay | Admitting: *Deleted

## 2013-03-11 DIAGNOSIS — E039 Hypothyroidism, unspecified: Secondary | ICD-10-CM

## 2013-03-11 MED ORDER — LEVOTHYROXINE SODIUM 75 MCG PO TABS: 88.0000 ug | ORAL_TABLET | Freq: Every day | ORAL | Status: DC

## 2013-03-11 NOTE — Telephone Encounter (Signed)
Her TSH was still WNL, and she wasn't having any symptoms at that time, so we elected to just recheck in 6 mos.  If she has now developed some symptoms, there was room with the TSH to increase the dose to from 75.  If she wants to try this, we can increase to , but needs to schedule a 6-8 week TSH (244.9).  I prefer she be on a brand Synthroid--ask her if she prefers generic vs brand (my preference is brand)

## 2013-03-11 NOTE — Telephone Encounter (Signed)
Patient called and stated that you had said that she might need to change her levothyroxine dosage. She has been very sleepy and lethargic, also has been having hot flashes. Wonders if maybe her dosage should be changed? Please advise.

## 2013-03-11 NOTE — Telephone Encounter (Signed)
Left message for patient to call me back and let me know where she would like rx called to. Placed future order for TSH, need to schedule lab visit.

## 2013-03-14 ENCOUNTER — Telehealth: Payer: Self-pay | Admitting: *Deleted

## 2013-03-14 MED ORDER — SYNTHROID 88 MCG PO TABS: 88.0000 ug | ORAL_TABLET | Freq: Every day | ORAL | Status: DC

## 2013-03-14 NOTE — Telephone Encounter (Signed)
Spoke with patient ans she is willing to try the brand name Synthroid. She will call back if she gets to the pharmacy and it is completely unaffordable. She is scheduled for 05/04/13 @ 2pm for TSH.

## 2013-03-14 NOTE — Telephone Encounter (Signed)
Left message for patient to return my call.

## 2013-03-16 ENCOUNTER — Other Ambulatory Visit: Payer: Self-pay | Admitting: *Deleted

## 2013-03-16 MED ORDER — SYNTHROID 88 MCG PO TABS: 88.0000 ug | ORAL_TABLET | Freq: Every day | ORAL | Status: DC

## 2013-05-04 ENCOUNTER — Other Ambulatory Visit: Payer: Medicare Other

## 2013-05-04 ENCOUNTER — Other Ambulatory Visit: Payer: Self-pay | Admitting: *Deleted

## 2013-05-04 DIAGNOSIS — E039 Hypothyroidism, unspecified: Secondary | ICD-10-CM

## 2013-05-04 DIAGNOSIS — E669 Obesity, unspecified: Secondary | ICD-10-CM

## 2013-05-04 DIAGNOSIS — E7800 Pure hypercholesterolemia, unspecified: Secondary | ICD-10-CM

## 2013-05-04 NOTE — Addendum Note (Signed)
Addended by: Debbrah Alar F on: 05/04/2013 11:11 AM   Modules accepted: Orders

## 2013-05-05 ENCOUNTER — Other Ambulatory Visit: Payer: Self-pay | Admitting: *Deleted

## 2013-05-05 LAB — TSH: TSH: 2.66 u[IU]/mL (ref 0.350–4.500)

## 2013-05-05 MED ORDER — SYNTHROID 88 MCG PO TABS: 88.0000 ug | ORAL_TABLET | Freq: Every day | ORAL | Status: DC

## 2013-05-10 ENCOUNTER — Other Ambulatory Visit: Payer: Self-pay | Admitting: Gastroenterology

## 2013-05-10 LAB — HM COLONOSCOPY

## 2013-05-26 ENCOUNTER — Encounter: Payer: Self-pay | Admitting: Internal Medicine

## 2013-05-27 ENCOUNTER — Telehealth: Payer: Self-pay | Admitting: Internal Medicine

## 2013-05-27 DIAGNOSIS — M25562 Pain in left knee: Secondary | ICD-10-CM

## 2013-05-27 MED ORDER — NAPROXEN 500 MG PO TABS: 500.0000 mg | ORAL_TABLET | Freq: Two times a day (BID) | ORAL | Status: DC

## 2013-05-27 NOTE — Telephone Encounter (Signed)
Pt.notified

## 2013-05-27 NOTE — Telephone Encounter (Signed)
Advise pt that rx was sent and to have a great trip!

## 2013-05-27 NOTE — Telephone Encounter (Signed)
Pt needs a refill on nasproxen 500mg . She states she is have pain in both knees and back and she is about to go on a trip and wants to take some med with her. Send to [facility] on [address]

## 2013-07-06 ENCOUNTER — Other Ambulatory Visit (INDEPENDENT_AMBULATORY_CARE_PROVIDER_SITE_OTHER): Payer: Medicare Other

## 2013-07-06 DIAGNOSIS — Z23 Encounter for immunization: Secondary | ICD-10-CM

## 2013-09-05 ENCOUNTER — Other Ambulatory Visit: Payer: Self-pay | Admitting: *Deleted

## 2013-09-05 ENCOUNTER — Telehealth: Payer: Self-pay | Admitting: Family Medicine

## 2013-09-05 MED ORDER — LEVOTHYROXINE SODIUM 88 MCG PO TABS: 88.0000 ug | ORAL_TABLET | Freq: Every day | ORAL | Status: DC

## 2013-09-05 NOTE — Telephone Encounter (Signed)
Done

## 2013-09-05 NOTE — Telephone Encounter (Signed)
This pt just called and stated she found out the sythroid is cheaper at [facility] on [address]. Please cancel pt's rx at [facility] and resend to [facility] on [address].

## 2013-10-04 ENCOUNTER — Other Ambulatory Visit: Payer: Medicare Other

## 2013-10-04 DIAGNOSIS — E669 Obesity, unspecified: Secondary | ICD-10-CM

## 2013-10-04 DIAGNOSIS — E039 Hypothyroidism, unspecified: Secondary | ICD-10-CM

## 2013-10-04 DIAGNOSIS — E7800 Pure hypercholesterolemia, unspecified: Secondary | ICD-10-CM

## 2013-10-04 LAB — LIPID PANEL
CHOL/HDL RATIO: 3.1 ratio
CHOLESTEROL: 249 mg/dL — AB (ref 0–200)
HDL: 81 mg/dL (ref >39)
LDL Cholesterol: 152 mg/dL — ABNORMAL HIGH (ref 0–99)
TRIGLYCERIDES: 79 mg/dL (ref <150)
VLDL: 16 mg/dL (ref 0–40)

## 2013-10-04 LAB — TSH: TSH: 3.248 u[IU]/mL (ref 0.350–4.500)

## 2013-10-04 LAB — GLUCOSE, RANDOM: Glucose, Bld: 94 mg/dL (ref 70–99)

## 2013-10-06 ENCOUNTER — Other Ambulatory Visit (HOSPITAL_COMMUNITY)
Admission: RE | Admit: 2013-10-06 | Discharge: 2013-10-06 | Disposition: A | Discharge status: Home / Self Care | Payer: Medicare Other | Source: Ambulatory Visit | Attending: Family Medicine | Admitting: Family Medicine

## 2013-10-06 ENCOUNTER — Encounter: Payer: Self-pay | Admitting: Family Medicine

## 2013-10-06 ENCOUNTER — Ambulatory Visit (INDEPENDENT_AMBULATORY_CARE_PROVIDER_SITE_OTHER): Payer: Medicare Other | Admitting: Family Medicine

## 2013-10-06 VITALS — BP 132/84 | HR 76 | Ht 67.0 in | Wt 222.0 lb

## 2013-10-06 DIAGNOSIS — Z0000 Encounter for general adult medical examination without abnormal findings: Secondary | ICD-10-CM

## 2013-10-06 DIAGNOSIS — Z01419 Encounter for gynecological examination (general) (routine) without abnormal findings: Secondary | ICD-10-CM | POA: Insufficient documentation

## 2013-10-06 DIAGNOSIS — IMO0002 Reserved for concepts with insufficient information to code with codable children: Secondary | ICD-10-CM

## 2013-10-06 DIAGNOSIS — E039 Hypothyroidism, unspecified: Secondary | ICD-10-CM

## 2013-10-06 DIAGNOSIS — E669 Obesity, unspecified: Secondary | ICD-10-CM

## 2013-10-06 DIAGNOSIS — M1710 Unilateral primary osteoarthritis, unspecified knee: Secondary | ICD-10-CM

## 2013-10-06 DIAGNOSIS — Z1151 Encounter for screening for human papillomavirus (HPV): Secondary | ICD-10-CM | POA: Insufficient documentation

## 2013-10-06 DIAGNOSIS — K219 Gastro-esophageal reflux disease without esophagitis: Secondary | ICD-10-CM

## 2013-10-06 DIAGNOSIS — M8580 Other specified disorders of bone density and structure, unspecified site: Secondary | ICD-10-CM

## 2013-10-06 DIAGNOSIS — M899 Disorder of bone, unspecified: Secondary | ICD-10-CM

## 2013-10-06 DIAGNOSIS — M949 Disorder of cartilage, unspecified: Secondary | ICD-10-CM

## 2013-10-06 DIAGNOSIS — E7800 Pure hypercholesterolemia, unspecified: Secondary | ICD-10-CM

## 2013-10-06 LAB — POCT URINALYSIS DIPSTICK
Bilirubin, UA: NEGATIVE
Glucose, UA: NEGATIVE
Ketones, UA: NEGATIVE
NITRITE UA: NEGATIVE
PH UA: 7
PROTEIN UA: NEGATIVE
Spec Grav, UA: 1.01
UROBILINOGEN UA: NEGATIVE

## 2013-10-06 LAB — HM PAP SMEAR: HM Pap smear: NORMAL

## 2013-10-06 MED ORDER — LEVOTHYROXINE SODIUM 88 MCG PO TABS: 88.0000 ug | ORAL_TABLET | Freq: Every day | ORAL | Status: DC

## 2013-10-06 NOTE — Progress Notes (Signed)
Chief Complaint  Patient presents with  . Annual Exam    nonfasting annual exam with pap, labs already done. UA showed 1+ leuks and blood, patient states only symptom is frequency but not a new symptom. Did not do eye exam, just had one with Dr.Hecker and got new glasses. Does mention some increased joint pain over the last year, mostly knees and hips.    Kayla Guerrero is a 79 y.o. female who presents for a complete physical.  She has the following concerns:  Hyperlipidemia follow-up: Patient is reportedly following a low-fat, low cholesterol diet, with some indiscretions over the holidays. She had stopped taking the red yeast rice almost a year ago, when she thought there was a possibility that it was contributing to her pain in her left leg/knee. Knee pain didn't get better off the RYR.  She didn't restart because labs done in May still looked okay.  Labs this week show much higher cholesterol (see below).   L knee pain--she had two injections into the left knee through [facility], but doesn't think they helped much. Does water aerobics 3x/week. She has pain in both knees, mostly just with getting up and down.  She also gets some pain in the upper portion (laterally) of her left hip, with stretching, and lying on it at night  Hypothyroidism--denies any significant change in energy, bowels, hair. Always has slight dry skin, constipation, hair thinning, but no recent change. Denies any depression, but some anxiety related to his son's divorce, but that has improved some (divorce is final).  Eyesight is getting worse. Dr. Gershon Crane sent her to a retinal specialist, who told her she has mild macular degeneration, but told her that really she just needs "better glasses".  She saw Dr. Herbert Deaner and was told she has astigmatism.  She got new glasses, and her vision is fine.   Immunization History  Administered Date(s) Administered  . H1N1 06/21/2009  . Influenza Split 06/19/2011, 06/22/2012  .  Influenza Whole 07/12/2010  . Influenza, High Dose Seasonal PF 07/06/2013  . Pneumococcal Polysaccharide-23 08/20/2010  . Tdap 12/12/2010  . Zoster 09/30/2011   Last Pap smear: 07/2010 (Dr. Lucianne Lei) Last mammogram: 11/2012 Last colonoscopy: 04/2013, Dr. Cristina Gong Last DEXA: 11/2010, mild osteopenia Dentist: twice yearly Ophtho: yearly (Dr. Herbert Deaner) Exercise:  Water exercises 3x/week Sees dermatologist for routine skin checks  She has living will, healthcare power of attorney, updated within the year  Other doctors caring for patient:  Ophto: Dr. Herbert Deaner Dentist: Dr. Tinnie Gens Endodontist: Dr. Satira Sark Ortho: Dr. Wynelle Link Derm: Dr. Bonney Roussel: Dr. Cristina Gong  Past Medical History  Diagnosis Date  . GERD (gastroesophageal reflux disease)   . Obesity   . Borderline osteopenia DEXA 2006 and 2012  . Cholesterol serum elevated   . FHx: colon cancer   . Colon polyps   . Cataract   . Arthritis of knee, left     Past Surgical History  Procedure Laterality Date  . Cholecystectomy    . Tubal ligation    . Colonoscopy  2010, 04/2013    Buccini  . Cataract extraction, bilateral    . Pterygium excision      History   Social History  . Marital Status: Married    Spouse Name: N/A    Number of Children: 1  . Years of Education: N/A   Occupational History  . retired Librarian, academic); Artist    Social History Main Topics  . Smoking status: Former Smoker    Quit date: 09/29/1980  .  Smokeless tobacco: Never Used  . Alcohol Use: Yes     Comment: 1 glass of wine daily (or less)  . Drug Use: No  . Sexual Activity: Not on file   Other Topics Concern  . Not on file   Social History Narrative   Lives with husband.  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 Son lives in [place], getting divorced    Family History  Problem Relation Age of Onset  . Cancer Mother 36    COLON   . Heart  disease Mother   . Mental illness Maternal Uncle   . Cancer Maternal Grandmother     stomach  . Macular degeneration Maternal Grandmother   . Diabetes Neg Hx   . Cancer Maternal Aunt   . Breast cancer Maternal Aunt     Current outpatient prescriptions:calcium carbonate (OS-CAL) 600 MG TABS, Take 600 mg by mouth daily.  , Disp: , Rfl: ;  calcium carbonate (TUMS - DOSED IN MG ELEMENTAL CALCIUM) 500 MG chewable tablet, Chew 1-2 tablets by mouth as needed for indigestion or heartburn., Disp: , Rfl: ;  Cholecalciferol (VITAMIN D) 2000 UNITS tablet, Take 2,000 Units by mouth daily., Disp: , Rfl:  fish oil-omega-3 fatty acids 1000 MG capsule, Take 1 g by mouth daily. , Disp: , Rfl: ;  glucosamine-chondroitin 500-400 MG tablet, Take 1 tablet by mouth 2 (two) times daily. , Disp: , Rfl: ;  levothyroxine (SYNTHROID, LEVOTHROID) 88 MCG tablet, Take 1 tablet (88 mcg total) by mouth daily before breakfast., Disp: 30 tablet, Rfl: 1;  Magnesium 400 MG CAPS, Take 1 capsule by mouth daily.  , Disp: , Rfl:  Multiple Vitamin (MULTI-VITAMIN) tablet, Take 1 tablet by mouth daily.  , Disp: , Rfl: ;  naproxen sodium (ANAPROX) 220 MG tablet, Take 220 mg by mouth as needed., Disp: , Rfl:  No current facility-administered medications for this visit. Facility-Administered Medications Ordered in Other Visits: influenza  inactive virus vaccine (FLUZONE/FLUARIX) injection 0.5 mL, 0.5 mL, Intramuscular, Once, Rita Ohara, MD  Allergies  Allergen Reactions  . Levaquin [Levofloxacin Hemihydrate] Other (See Comments)  . Promethazine Hcl Other (See Comments)  . Sulfa Drugs Cross Reactors Swelling    ROS: The patient denies anorexia, fever, headaches, vision changes, decreased hearing, ear pain, sore throat, breast concerns, chest pain, palpitations, dizziness, syncope, dyspnea on exertion, cough, swelling, nausea, vomiting, diarrhea, constipation, abdominal pain, melena, hematochezia, hematuria, incontinence, dysuria, vaginal  bleeding, discharge, odor or itch, genital lesions, numbness, tingling, weakness, tremor, suspicious skin lesions, depression, abnormal bleeding/bruising, or enlarged lymph nodes. Intermittent trouble sleeping. +decreased libido (stable, unchanged);  +L knee pain--see HPI  +memory troubles (unimportant things, nothing significant, unchanged--names)  +weight gain--she thinks related to poor diet, ice cream in the summertime. Heartburn more frequently, almost daily, relieved by Tums   BP 132/84  Pulse 76  Ht 5\' 7"  (1.702 m)  Wt 222 lb (100.699 kg)  BMI 34.76 kg/m2  General Appearance:  Alert, cooperative, no distress, appears stated age   Head:  Normocephalic, without obvious abnormality, atraumatic   Eyes:  PERRL, conjunctiva/corneas clear, EOM's intact, fundi  benign   Ears:  Normal TM's and external ear canals   Nose:  Nares normal, mucosa normal, no drainage or sinus tenderness   Throat:  Lips, mucosa, and tongue normal; teeth and gums normal   Neck:  Supple, no lymphadenopathy; thyroid: no enlargement/tenderness/nodules; no carotid  bruit  or JVD   Back:  Spine nontender, no curvature, ROM normal, no CVA tenderness   Lungs:  Clear to auscultation bilaterally without wheezes, rales or ronchi; respirations unlabored   Chest Wall:  No tenderness or deformity   Heart:  Regular rate and rhythm, S1 and S2 normal, no murmur, rub  or gallop   Breast Exam:  No tenderness, masses, or nipple discharge or inversion. No axillary lymphadenopathy   Abdomen:  Soft, non-tender, nondistended, normoactive bowel sounds,  no masses, no hepatosplenomegaly   Genitalia:  Normal external genitalia without lesions. BUS and vagina normal; no cervical motion tenderness. No abnormal vaginal discharge. +atrophic changes. Uterus and adnexa not enlarged, nontender, no masses. Pap performed   Rectal:  Normal tone, no masses or tenderness; guaiac negative stool; +external hemorrhoids   Extremities:  No clubbing,  cyanosis or edema. See below for knee exam   Pulses:  2+ and symmetric all extremities   Skin:  Skin color, texture, turgor normal, no rashes or lesions.   Lymph nodes:  Cervical, supraclavicular, and axillary nodes normal   Neurologic:  CNII-XII intact, normal strength, sensation and gait; reflexes 2+ and symmetric throughout   Psych: Normal mood, affect, hygiene and grooming.   Pap done  Lab Results  Component Value Date   TSH 3.248 10/04/2013   Lab Results  Component Value Date   CHOL 249* 10/04/2013   HDL 81 10/04/2013   LDLCALC 152* 10/04/2013   TRIG 79 10/04/2013   CHOLHDL 3.1 10/04/2013   Glucose 94  ASSESSMENT/PLAN:  Routine general medical examination at a health care facility - Plan: POCT Urinalysis Dipstick, Cytology - PAP [place]  Pure hypercholesterolemia - much higher than last visit, due to changes in diet and being off red yeast rice (had been of x 2 mos prior to last check also, higher now). restart RYR  Unspecified hypothyroidism - adequately replaced.  reminded proper way to take med - Plan: levothyroxine (SYNTHROID, LEVOTHROID) 88 MCG tablet  Osteopenia - due for f/u DEXA.  discussed how to get Calcium from diet (and remainder from vitamins), vitamin D and weight-bearing exercise. - Plan: DG Bone Density  Obesity (BMI 30-39.9) - counseled re: weight loss--portion control healthy diet, increased exercise, less wine. Will help with her knee pain if she can lose wt  Arthritis of knee - continue water aerobics.  recommended exercise bike, weight loss  GERD (gastroesophageal reflux disease) - reviewed proper diet, behavioral techniques, OTC meds to use prn vs preventatively.  Wt loss encouraged   Discussed monthly self breast exams and yearly mammograms after the age of 78; at least 30 minutes of aerobic activity at least 5 days/week; proper sunscreen use reviewed; healthy diet, including goals of calcium and vitamin D intake and alcohol recommendations (less than or  equal to 1 drink/day) reviewed; regular seatbelt use; changing batteries in smoke detectors.  Immunization recommendations discussed--UTD.  Colonoscopy recommendations reviewed--UTD.  Pap smear done today  High cholesterol: Restart red yeast rice; reviewed low cholesterol diet.  GERD--reflux diet and precautions reviewed.  Cut back on alcohol.  If continues to have daily symptoms, use zantac or pepcid or prilosec daily.  F/u 6 months with labs prior  20-25 minutes of this 45 min visit was spent counseling

## 2013-10-06 NOTE — Patient Instructions (Addendum)
HEALTH MAINTENANCE RECOMMENDATIONS:  It is recommended that you get at least 30 minutes of aerobic exercise at least 5 days/week (for weight loss, you may need as much as 60-90 minutes). This can be any activity that gets your heart rate up. This can be divided in 10-15 minute intervals if needed, but try and build up your endurance at least once a week.  Weight bearing exercise is also recommended twice weekly.  Eat a healthy diet with lots of vegetables, fruits and fiber.  "Colorful" foods have a lot of vitamins (ie green vegetables, tomatoes, red peppers, etc).  Limit sweet tea, regular sodas and alcoholic beverages, all of which has a lot of calories and sugar.  Up to 1 alcoholic drink daily may be beneficial for women (unless trying to lose weight, watch sugars).  Drink a lot of water.  Calcium recommendations are 1200-1500 mg daily (1500 mg for postmenopausal women or women without ovaries), and vitamin D 1000 IU daily.  This should be obtained from diet and/or supplements (vitamins), and calcium should not be taken all at once, but in divided doses.  Monthly self breast exams and yearly mammograms for women over the age of 58 is recommended.  Sunscreen of at least SPF 30 should be used on all sun-exposed parts of the skin when outside between the hours of 10 am and 4 pm (not just when at beach or pool, but even with exercise, golf, tennis, and yard work!)  Use a sunscreen that says "broad spectrum" so it covers both UVA and UVB rays, and make sure to reapply every 1-2 hours.  Remember to change the batteries in your smoke detectors when changing your clock times in the spring and fall.  Use your seat belt every time you are in a car, and please drive safely and not be distracted with cell phones and texting while driving.  Start exercising every day. It is important to lose weight to help with your knee pain.  Cut back on the excess calories from alcohol.  Drink more water.  Limit portion  sizes and make smarter food choices--cut back on sweets and carbs  We are referring you for repeat bone density.  Restart the Red Yeast Rice, and cut back on cholesterol in your diet.  Diet for Gastroesophageal Reflux Disease, Adult Reflux (acid reflux) is when acid from your stomach flows up into the esophagus. When acid comes in contact with the esophagus, the acid causes irritation and soreness (inflammation) in the esophagus. When reflux happens often or so severely that it causes damage to the esophagus, it is called gastroesophageal reflux disease (GERD). Nutrition therapy can help ease the discomfort of GERD. FOODS OR DRINKS TO AVOID OR LIMIT  Smoking or chewing tobacco. Nicotine is one of the most potent stimulants to acid production in the gastrointestinal tract.  Caffeinated and decaffeinated coffee and black tea.  Regular or low-calorie carbonated beverages or energy drinks (caffeine-free carbonated beverages are allowed).   Strong spices, such as black pepper, white pepper, red pepper, cayenne, curry powder, and chili powder.  Peppermint or spearmint.  Chocolate.  High-fat foods, including meats and fried foods. Extra added fats including oils, butter, salad dressings, and nuts. Limit these to less than 8 tsp per day.  Fruits and vegetables if they are not tolerated, such as citrus fruits or tomatoes.  Alcohol.  Any food that seems to aggravate your condition. If you have questions regarding your diet, call your caregiver or a registered dietitian. OTHER  THINGS THAT MAY HELP GERD INCLUDE:   Eating your meals slowly, in a relaxed setting.  Eating 5 to 6 small meals per day instead of 3 large meals.  Eliminating food for a period of time if it causes distress.  Not lying down until 3 hours after eating a meal.  Keeping the head of your bed raised 6 to 9 inches (15 to 23 cm) by using a foam wedge or blocks under the legs of the bed. Lying flat may make symptoms  worse.  Being physically active. Weight loss may be helpful in reducing reflux in overweight or obese adults.  Wear loose fitting clothing EXAMPLE MEAL PLAN This meal plan is approximately 2,000 calories based on [email or web address] meal planning guidelines. Breakfast   cup cooked oatmeal.  1 cup strawberries.  1 cup low-fat milk.  1 oz almonds. Snack  1 cup cucumber slices.  6 oz yogurt (made from low-fat or fat-free milk). Lunch  2 slice whole-wheat bread.  2 oz sliced [place].  2 tsp mayonnaise.  1 cup blueberries.  1 cup snap peas. Snack  6 whole-wheat crackers.  1 oz string cheese. Dinner   cup brown rice.  1 cup mixed veggies.  1 tsp olive oil.  3 oz grilled fish. Document Released: 09/15/2005 Document Revised: 12/08/2011 Document Reviewed: 08/01/2011 [facility] Patient Information 2014 [place], [place].

## 2013-10-10 ENCOUNTER — Encounter: Payer: Self-pay | Admitting: Family Medicine

## 2013-10-26 ENCOUNTER — Encounter: Payer: Medicare Other | Admitting: Family Medicine

## 2014-04-04 ENCOUNTER — Other Ambulatory Visit: Payer: Medicare Other

## 2014-04-04 DIAGNOSIS — E7800 Pure hypercholesterolemia, unspecified: Secondary | ICD-10-CM

## 2014-04-04 DIAGNOSIS — E039 Hypothyroidism, unspecified: Secondary | ICD-10-CM

## 2014-04-05 LAB — LIPID PANEL
CHOL/HDL RATIO: 3 ratio
Cholesterol: 224 mg/dL — ABNORMAL HIGH (ref 0–200)
HDL: 74 mg/dL (ref >39)
LDL Cholesterol: 136 mg/dL — ABNORMAL HIGH (ref 0–99)
Triglycerides: 72 mg/dL (ref <150)
VLDL: 14 mg/dL (ref 0–40)

## 2014-04-05 LAB — COMPREHENSIVE METABOLIC PANEL
ALK PHOS: 48 U/L (ref 39–117)
ALT: 8 U/L (ref 0–35)
AST: 17 U/L (ref 0–37)
Albumin: 3.7 g/dL (ref 3.5–5.2)
BUN: 13 mg/dL (ref 6–23)
CO2: 28 mEq/L (ref 19–32)
Calcium: 8.7 mg/dL (ref 8.4–10.5)
Chloride: 105 mEq/L (ref 96–112)
Creat: 0.92 mg/dL (ref 0.50–1.10)
Glucose, Bld: 88 mg/dL (ref 70–99)
Potassium: 4.5 mEq/L (ref 3.5–5.3)
SODIUM: 140 meq/L (ref 135–145)
TOTAL PROTEIN: 6.5 g/dL (ref 6.0–8.3)
Total Bilirubin: 0.5 mg/dL (ref 0.2–1.2)

## 2014-04-05 LAB — TSH: TSH: 2.197 u[IU]/mL (ref 0.350–4.500)

## 2014-04-06 ENCOUNTER — Ambulatory Visit (INDEPENDENT_AMBULATORY_CARE_PROVIDER_SITE_OTHER): Payer: Medicare Other | Admitting: Family Medicine

## 2014-04-06 ENCOUNTER — Encounter: Payer: Self-pay | Admitting: Family Medicine

## 2014-04-06 ENCOUNTER — Other Ambulatory Visit: Payer: Self-pay

## 2014-04-06 VITALS — BP 130/82 | HR 72 | Ht 66.5 in | Wt 214.0 lb

## 2014-04-06 DIAGNOSIS — E7800 Pure hypercholesterolemia, unspecified: Secondary | ICD-10-CM

## 2014-04-06 DIAGNOSIS — Z79899 Other long term (current) drug therapy: Secondary | ICD-10-CM

## 2014-04-06 DIAGNOSIS — Z1231 Encounter for screening mammogram for malignant neoplasm of breast: Secondary | ICD-10-CM

## 2014-04-06 DIAGNOSIS — E039 Hypothyroidism, unspecified: Secondary | ICD-10-CM

## 2014-04-06 DIAGNOSIS — M899 Disorder of bone, unspecified: Secondary | ICD-10-CM

## 2014-04-06 DIAGNOSIS — M949 Disorder of cartilage, unspecified: Secondary | ICD-10-CM

## 2014-04-06 DIAGNOSIS — M8580 Other specified disorders of bone density and structure, unspecified site: Secondary | ICD-10-CM

## 2014-04-06 DIAGNOSIS — K219 Gastro-esophageal reflux disease without esophagitis: Secondary | ICD-10-CM

## 2014-04-06 DIAGNOSIS — E669 Obesity, unspecified: Secondary | ICD-10-CM

## 2014-04-06 NOTE — Progress Notes (Signed)
Chief Complaint  Patient presents with  . Hypothyroidism    nonfasting med check-labs already done.     Hyperlipidemia: She restarted the red yeast rice, but only at 1/2 the dose that she used to take (only taking 1/day).  She didn't have any side effects or problems, just doesn't like taking a lot of pills.  She admits to having cheese and ice cream more than she should, but limits eggs, red meat just 2x/week.  Hypothyroid--denies fatigue (less energy than in the past, but nothing new/acute, gradually "slowing down".  Denies changes in hair/skin/bowels/moods.  She lost 10 pounds since her last visit, regained 2.  Weight loss was intentional.  Water aerobics 3x/week, a little walking.  She had a fainting spell while in airport in March (after the 1st flight).  Her husband had been ill (vertigo, nausea/dizzy).  She went to grab coffee at [facility], and she felt lightheaded and fainted.  She thinks she was dehydrated--hadn't been drinking anything, and hadn't needed to void all that day.  No problems since then.  There was no associated chest pain, shortness of breath, tachycardia.  She didn't need any treatment, completed her trip to [place] and had no further problems.  Osteopenia:  Last DEXA was 11/2010.  Ordered DEXA at her CPE in January, but she didn't get a call to schedule appointment.   Past Medical History  Diagnosis Date  . GERD (gastroesophageal reflux disease)   . Obesity   . Borderline osteopenia DEXA 2006 and 2012  . Cholesterol serum elevated   . FHx: colon cancer   . Colon polyps   . Cataract   . Arthritis of knee, left    Past Surgical History  Procedure Laterality Date  . Cholecystectomy    . Tubal ligation    . Colonoscopy  2010, 04/2013    Buccini  . Cataract extraction, bilateral    . Pterygium excision     History   Social History  . Marital Status: Married    Spouse Name: N/A    Number of Children: 1  . Years of Education: N/A   Occupational History  .  retired Librarian, academic); Artist    Social History Main Topics  . Smoking status: Former Smoker    Quit date: 09/29/1980  . Smokeless tobacco: Never Used  . Alcohol Use: Yes     Comment: 1 glass of wine daily (or less)  . Drug Use: No  . Sexual Activity: Not on file   Other Topics Concern  . Not on file   Social History Narrative   Lives with husband.  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 Son lives in [place], getting divorced   Outpatient Encounter Prescriptions as of 04/06/2014  Medication Sig Note  . calcium carbonate (OS-CAL) 600 MG TABS Take 600 mg by mouth daily.     . Cholecalciferol (VITAMIN D) 2000 UNITS tablet Take 2,000 Units by mouth daily.   . fish oil-omega-3 fatty acids 1000 MG capsule Take 1 g by mouth daily.    [name] glucosamine-chondroitin 500-400 MG tablet Take 1 tablet by mouth 2 (two) times daily.    [name] levothyroxine (SYNTHROID, LEVOTHROID) 88 MCG tablet Take 1 tablet (88 mcg total) by mouth daily before breakfast.   . Magnesium 400 MG CAPS Take 1 capsule by mouth daily.     . Multiple Vitamin (MULTI-VITAMIN) tablet Take 1 tablet by mouth daily.     [name]  Red Yeast Rice Extract (RED YEAST RICE PO) Take 1 tablet by mouth daily.   . calcium carbonate (TUMS - DOSED IN MG ELEMENTAL CALCIUM) 500 MG chewable tablet Chew 1-2 tablets by mouth as needed for indigestion or heartburn. 04/06/2014: Uses prn heartburn, infrequently  . naproxen sodium (ANAPROX) 220 MG tablet Take 220 mg by mouth as needed. 04/06/2014: Uses only as needed (usually just with travel), not regularly   Allergies  Allergen Reactions  . Levaquin [Levofloxacin Hemihydrate] Other (See Comments)  . Promethazine Hcl Other (See Comments)  . Sulfa Drugs Cross Reactors Swelling   ROS:  Denies fevers, chills, URI symptoms, headaches, dizziness.  Syncopal episode related to dehydration in March--see HPI.  No chest pain,  palpitations, nausea, vomiting, bowel changes, depression, anxiety, hair/skin changes.  No urinary complaints. Her left knee is doing much better after series of 3 injections Only occasional reflux, with long periods without problems.  Relieved by prn Tums  PHYSICAL EXAM: BP 130/82  Pulse 72  Ht 5' 6.5" (1.689 m)  Wt 214 lb (97.07 kg)  BMI 34.03 kg/m2 Well developed, pleasant, obese female in no distress HEENT:  PERRL, EOMI, conjunctiva clear Neck: no lymphadenopathy, thyromegaly or mass Heart: regular rate and rhythm Lungs: clear bilaterally Abdomen: soft, nontender, no organomegaly or mass Extremities: no edema Psych: normal mood, affect, hygiene and grooming Neuro: alert and oriented.  Cranial nerves, strength and gait normal     Chemistry      Component Value Date/Time   NA 140 04/04/2014 0836   K 4.5 04/04/2014 0836   CL 105 04/04/2014 0836   CO2 28 04/04/2014 0836   BUN 13 04/04/2014 0836   CREATININE 0.92 04/04/2014 0836      Component Value Date/Time   CALCIUM 8.7 04/04/2014 0836   ALKPHOS 48 04/04/2014 0836   AST 17 04/04/2014 0836   ALT 8 04/04/2014 0836   BILITOT 0.5 04/04/2014 0836     Glucose 88  Lab Results  Component Value Date   TSH 2.197 04/04/2014   Lab Results  Component Value Date   CHOL 224* 04/04/2014   HDL 74 04/04/2014   LDLCALC 136* 04/04/2014   TRIG 72 04/04/2014   CHOLHDL 3.0 04/04/2014   ASSESSMENT/PLAN:  Unspecified hypothyroidism - adequately replaced  Pure hypercholesterolemia - improved since restarting red yeast rice, but at lower dose, so still a little high.  Reviewed dietary improvements (she prefers rather than taking more pills)  Obesity (BMI 30-39.9) - further weight loss strongly encouraged  Gastroesophageal reflux disease without esophagitis - infrequently requires meds.  Briefly reviewed diet, benefit of further weight loss  Osteopenia - past due for repeat DEXA.  Pt to call and schedule (along with her mammo, also past due)  Past due for mammo  (due in 11/2013) Past due for DEXA  [facility] and schedule both.  F/u in 6 months for CPE with fasting labs prior (will order TSH again--if remains normal, will do just yearly, at her physicals)

## 2014-04-06 NOTE — Patient Instructions (Signed)
Your cholesterol is improved--not as low as in the past, but probably low enough.  Continue your current dose of red yeast rice, but try and cut back on the cholesterol in your diet (cheese, ice cream, etc as we discussed).  Try and increase exercise to 5 days/week (even if just 2 short walks for 30 minutes daily total).  Please call the [facility] and schedule your mammogram and bone density test (order is already in the computer).

## 2014-04-20 ENCOUNTER — Ambulatory Visit
Admission: RE | Admit: 2014-04-20 | Discharge: 2014-04-20 | Disposition: A | Discharge status: Home / Self Care | Payer: Medicare Other | Source: Ambulatory Visit

## 2014-04-20 ENCOUNTER — Ambulatory Visit
Admission: RE | Admit: 2014-04-20 | Discharge: 2014-04-20 | Disposition: A | Discharge status: Home / Self Care | Payer: Medicare Other | Source: Ambulatory Visit | Attending: Family Medicine | Admitting: Family Medicine

## 2014-04-20 DIAGNOSIS — M8580 Other specified disorders of bone density and structure, unspecified site: Secondary | ICD-10-CM

## 2014-04-20 DIAGNOSIS — Z1231 Encounter for screening mammogram for malignant neoplasm of breast: Secondary | ICD-10-CM

## 2014-04-20 LAB — HM MAMMOGRAPHY: HM Mammogram: NORMAL

## 2014-04-20 LAB — HM DEXA SCAN: HM Dexa Scan: NORMAL

## 2014-04-26 ENCOUNTER — Encounter: Payer: Self-pay | Admitting: Internal Medicine

## 2014-07-21 ENCOUNTER — Other Ambulatory Visit (INDEPENDENT_AMBULATORY_CARE_PROVIDER_SITE_OTHER): Payer: Medicare Other

## 2014-07-21 DIAGNOSIS — Z23 Encounter for immunization: Secondary | ICD-10-CM

## 2014-07-31 ENCOUNTER — Encounter: Payer: Self-pay | Admitting: Family Medicine

## 2014-08-10 ENCOUNTER — Telehealth: Payer: Self-pay | Admitting: Family Medicine

## 2014-08-10 NOTE — Telephone Encounter (Signed)
Patient informed. 

## 2014-08-10 NOTE — Telephone Encounter (Signed)
She would need appointment only if unable to control her pain.  Rarely with this type of injury would we see any kind of fracture that would have the ribs angulated (sticking out abnormally)--this could puncture lung, and she would have shortness of breath.  I do not think there is a significant chance of this.  Obviously, needs eval if having shortness of breath.  She can use tylenol/NSAIDs prn pain control (I think she has aleve at home)

## 2014-09-19 ENCOUNTER — Other Ambulatory Visit: Payer: Self-pay | Admitting: Family Medicine

## 2014-10-10 ENCOUNTER — Other Ambulatory Visit: Payer: Medicare Other

## 2014-10-10 DIAGNOSIS — Z79899 Other long term (current) drug therapy: Secondary | ICD-10-CM

## 2014-10-10 DIAGNOSIS — E7800 Pure hypercholesterolemia, unspecified: Secondary | ICD-10-CM

## 2014-10-10 DIAGNOSIS — E039 Hypothyroidism, unspecified: Secondary | ICD-10-CM

## 2014-10-10 LAB — CBC WITH DIFFERENTIAL/PLATELET
Basophils Absolute: 0 10*3/uL (ref 0.0–0.1)
Basophils Relative: 1 % (ref 0–1)
EOS PCT: 6 % — AB (ref 0–5)
Eosinophils Absolute: 0.2 10*3/uL (ref 0.0–0.7)
HCT: 42.7 % (ref 36.0–46.0)
HEMOGLOBIN: 14.1 g/dL (ref 12.0–15.0)
LYMPHS ABS: 1.9 10*3/uL (ref 0.7–4.0)
LYMPHS PCT: 46 % (ref 12–46)
MCH: 28.5 pg (ref 26.0–34.0)
MCHC: 33 g/dL (ref 30.0–36.0)
MCV: 86.3 fL (ref 78.0–100.0)
MPV: 10.9 fL (ref 8.6–12.4)
Monocytes Absolute: 0.2 10*3/uL (ref 0.1–1.0)
Monocytes Relative: 6 % (ref 3–12)
Neutro Abs: 1.7 10*3/uL (ref 1.7–7.7)
Neutrophils Relative %: 41 % — ABNORMAL LOW (ref 43–77)
Platelets: 288 10*3/uL (ref 150–400)
RBC: 4.95 MIL/uL (ref 3.87–5.11)
RDW: 14.1 % (ref 11.5–15.5)
WBC: 4.1 10*3/uL (ref 4.0–10.5)

## 2014-10-11 LAB — COMPREHENSIVE METABOLIC PANEL
ALK PHOS: 47 U/L (ref 39–117)
ALT: 9 U/L (ref 0–35)
AST: 16 U/L (ref 0–37)
Albumin: 3.7 g/dL (ref 3.5–5.2)
BUN: 16 mg/dL (ref 6–23)
CO2: 27 meq/L (ref 19–32)
Calcium: 9 mg/dL (ref 8.4–10.5)
Chloride: 102 mEq/L (ref 96–112)
Creat: 0.92 mg/dL (ref 0.50–1.10)
GLUCOSE: 85 mg/dL (ref 70–99)
POTASSIUM: 4.4 meq/L (ref 3.5–5.3)
Sodium: 139 mEq/L (ref 135–145)
Total Bilirubin: 0.5 mg/dL (ref 0.2–1.2)
Total Protein: 6.4 g/dL (ref 6.0–8.3)

## 2014-10-11 LAB — TSH: TSH: 3.237 u[IU]/mL (ref 0.350–4.500)

## 2014-10-11 LAB — LIPID PANEL
CHOLESTEROL: 243 mg/dL — AB (ref 0–200)
HDL: 78 mg/dL (ref >39)
LDL Cholesterol: 149 mg/dL — ABNORMAL HIGH (ref 0–99)
TRIGLYCERIDES: 80 mg/dL (ref <150)
Total CHOL/HDL Ratio: 3.1 Ratio
VLDL: 16 mg/dL (ref 0–40)

## 2014-10-12 ENCOUNTER — Encounter: Payer: Self-pay | Admitting: Family Medicine

## 2014-10-12 ENCOUNTER — Ambulatory Visit (INDEPENDENT_AMBULATORY_CARE_PROVIDER_SITE_OTHER): Payer: Medicare Other | Admitting: Family Medicine

## 2014-10-12 VITALS — BP 140/72 | HR 60 | Ht 66.0 in | Wt 218.0 lb

## 2014-10-12 DIAGNOSIS — Z0000 Encounter for general adult medical examination without abnormal findings: Secondary | ICD-10-CM

## 2014-10-12 DIAGNOSIS — E669 Obesity, unspecified: Secondary | ICD-10-CM

## 2014-10-12 DIAGNOSIS — E039 Hypothyroidism, unspecified: Secondary | ICD-10-CM

## 2014-10-12 DIAGNOSIS — E7800 Pure hypercholesterolemia, unspecified: Secondary | ICD-10-CM

## 2014-10-12 DIAGNOSIS — K219 Gastro-esophageal reflux disease without esophagitis: Secondary | ICD-10-CM

## 2014-10-12 DIAGNOSIS — Z23 Encounter for immunization: Secondary | ICD-10-CM

## 2014-10-12 LAB — POCT URINALYSIS DIPSTICK
BILIRUBIN UA: NEGATIVE
GLUCOSE UA: NEGATIVE
Ketones, UA: NEGATIVE
NITRITE UA: NEGATIVE
PH UA: 7
Protein, UA: NEGATIVE
RBC UA: NEGATIVE
Spec Grav, UA: 1.015
Urobilinogen, UA: NEGATIVE

## 2014-10-12 MED ORDER — LEVOTHYROXINE SODIUM 88 MCG PO TABS: 88.0000 ug | ORAL_TABLET | Freq: Every day | ORAL | Status: DC

## 2014-10-12 NOTE — Progress Notes (Addendum)
Chief Complaint  Patient presents with  . Annual Exam    nonfasting annual exam with pelvic, labs already done. Has been having some balance/dizziness issues x few months. Also has been having indigestion issues ongoing that has worsened.    Kayla Guerrero is a 80 y.o. female who presents for annual wellness visit, complete physical and follow-up on chronic medical conditions.  She has the following concerns:  Reflux/indigestion: Symptoms have become daily in the last 6 months.  She has been taking Tums once or twice daily, which seems to help.  She finds that she has heartburn now with almost any kind of food, even a bagel.  Symptom start about 2 hours after eating.  Denies dysphagia.   +caffeine, citrus, alcohol--denies any significant change in diet in the last 6 months.  Dizziness: For the last couple of months, when she wakes up in the middle of the night to go to the bathroom, if she hops up quickly, the room will spin, feel vertigo and stumbles to the bathroom.  If she sits for a minute at the edge of the bed before getting up, then no vertigo occurs. Only other episodes of vertigo are very rare, sometimes when lying in bed. She has learned to get up slower, and has been fine.  Hyperlipidemia: She restarted the red yeast rice, but only at 1/2 the dose that she used to take (only taking 1/day).She states that at her last visit she really wasn't taking it daily, but has now been taking it once daily x 6 months.  She admits to still having cheese and ice cream more than she should, but limits eggs, red meat just 2x/week. She has been bad in her diet with sweets, chocolate.  Hypothyroid--denies fatigue (gradually "slowing down"). Denies changes in hair/skin/bowels/moods.Has some constipation, that is usually pretty controlled. 4# weight gain since last, but still 4# lower than she was last year this time.   Immunization History  Administered Date(s) Administered  . H1N1 06/21/2009  .  Influenza Split 06/19/2011, 06/22/2012  . Influenza Whole 07/12/2010  . Influenza, High Dose Seasonal PF 07/06/2013, 07/21/2014  . Pneumococcal Polysaccharide-23 08/20/2010  . Tdap 12/12/2010  . Zoster 09/30/2011   Last Pap smear: 09/2013 Last mammogram: 03/2014 Last colonoscopy: 04/2013, Dr. Cristina Gong Last DEXA: 03/2014 Dentist: twice yearly Ophtho: yearly (Dr. Herbert Deaner) Exercise: Water exercises 3x/week. Occasional exercise. Sees dermatologist for routine skin checks   Other doctors caring for patient include:  Ophto: Dr. Herbert Deaner Dentist: Dr. Tinnie Gens Endodontist: Dr. Satira Sark Ortho: Dr. Wynelle Link Derm: Dr. Bonney Roussel: Dr. Cristina Gong  Depression screen:  See scanned questionnaire.  Notable for some intermittent trouble with sleep, and gradual "slowing down". No depression ADL screen:  See scanned questionnaire.  Notable for occasional problem with hearing, and with balance as per HPI. Doesn't see as well as she used to.  End of Life Discussion:  Patient has a living will and medical power of attorney  Past Medical History  Diagnosis Date  . GERD (gastroesophageal reflux disease)   . Obesity   . Borderline osteopenia DEXA 2006 and 2012  . Cholesterol serum elevated   . FHx: colon cancer   . Colon polyps   . Cataract   . Arthritis of knee, left     Past Surgical History  Procedure Laterality Date  . Cholecystectomy    . Tubal ligation    . Colonoscopy  2010, 04/2013    Buccini  . Cataract extraction, bilateral    . Pterygium excision  History   Social History  . Marital Status: Married    Spouse Name: N/A    Number of Children: 1  . Years of Education: N/A   Occupational History  . retired Librarian, academic); Artist    Social History Main Topics  . Smoking status: Former Smoker    Quit date: 09/29/1980  . Smokeless tobacco: Never Used  . Alcohol Use: 0.0 oz/week    0 Not specified per week     Comment: 1 glass of wine 3-4 times/week; hard liquor (highball) or a  beer once a week  . Drug Use: No  . Sexual Activity: Not on file   Other Topics Concern  . Not on file   Social History Narrative   Lives with husband.  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 Son lives in [place]; granddaughter lives in [place] (with her mother--son is divorced)    Family History  Problem Relation Age of Onset  . Cancer Mother 51    COLON   . Heart disease Mother   . Mental illness Maternal Uncle   . Cancer Maternal Grandmother     stomach  . Macular degeneration Maternal Grandmother   . Diabetes Neg Hx   . Cancer Maternal Aunt   . Breast cancer Maternal Aunt     Outpatient Encounter Prescriptions as of 10/12/2014  Medication Sig Note  . calcium carbonate (OS-CAL) 600 MG TABS Take 600 mg by mouth daily.     . Cholecalciferol (VITAMIN D) 2000 UNITS tablet Take 2,000 Units by mouth daily.   . fish oil-omega-3 fatty acids 1000 MG capsule Take 1 g by mouth daily.    [name] glucosamine-chondroitin 500-400 MG tablet Take 1 tablet by mouth 2 (two) times daily.    [name] levothyroxine (SYNTHROID, LEVOTHROID) 88 MCG tablet Take 1 tablet (88 mcg total) by mouth daily before breakfast.   . Magnesium 400 MG CAPS Take 1 capsule by mouth daily.     . Multiple Vitamin (MULTI-VITAMIN) tablet Take 1 tablet by mouth daily.     . Red Yeast Rice Extract (RED YEAST RICE PO) Take 1 tablet by mouth daily.   . calcium carbonate (TUMS - DOSED IN MG ELEMENTAL CALCIUM) 500 MG chewable tablet Chew 1-2 tablets by mouth as needed for indigestion or heartburn. 10/12/2014: Using once or twice daily recently due to frequent heartburn  . naproxen sodium (ANAPROX) 220 MG tablet Take 220 mg by mouth as needed. 04/06/2014: Uses only as needed (usually just with travel), not regularly  . [DISCONTINUED] levothyroxine (SYNTHROID, LEVOTHROID) 88 MCG tablet TAKE ONE TABLET BY MOUTH ONCE DAILY BEFORE BREAKFAST      Allergies  Allergen Reactions  . Levaquin [Levofloxacin Hemihydrate] Other (See Comments)  . Promethazine Hcl Other (See Comments)  . Sulfa Drugs Cross Reactors Swelling   ROS: The patient denies anorexia, fever, headaches, vision changes, decreased hearing, ear pain, sore throat, breast concerns, chest pain, palpitations, syncope, dyspnea on exertion, cough, swelling, nausea, vomiting, diarrhea, abdominal pain, melena, hematochezia, hematuria, incontinence (rare urge incontinence, slight), dysuria, vaginal bleeding, discharge, odor or itch, genital lesions, numbness, tingling, weakness, tremor, suspicious skin lesions, depression, abnormal bleeding/bruising, or enlarged lymph nodes. Intermittent trouble sleeping. +decreased libido (stable, unchanged);  L knee pain--s/p injections and pain has improved.  Has some pain in her calves and just below the knees at times (over anterior tibialis muscle) +memory troubles (unimportant things, nothing significant,  unchanged--names)  Heartburn daily--see HPI Syncope in airport in March (previously addressed--no further syncope) Felt dizzy another time (in November) while on a plane--dehydrated. Constipation--controlled with diet, fiber supplement. Vertigo if she gets up too quickly from bed, as per HPI   PHYSICAL EXAM:  BP 140/72 mmHg  Pulse 60  Ht 5\' 6"  (1.676 m)  Wt 218 lb (98.884 kg)  BMI 35.20 kg/m2  General Appearance:  Alert, cooperative, no distress, appears stated age   Head:  Normocephalic, without obvious abnormality, atraumatic   Eyes:  PERRL, conjunctiva/corneas clear, EOM's intact, fundi  benign   Ears:  Normal TM's and external ear canals.  Irritation and recent bleed in inferior canal on the right (she used Q-tips this morning; denies pain)  Nose:  Nares normal, mucosa normal, no drainage or sinus tenderness   Throat:  Lips, mucosa, and tongue normal; teeth and gums normal   Neck:  Supple, no lymphadenopathy;  thyroid: no enlargement/tenderness/nodules; no carotid  bruit or JVD   Back:  Spine nontender, no curvature, ROM normal, no CVA tenderness   Lungs:  Clear to auscultation bilaterally without wheezes, rales or ronchi; respirations unlabored   Chest Wall:  No tenderness or deformity   Heart:  Regular rate and rhythm, S1 and S2 normal, no murmur, rub  or gallop   Breast Exam:  No tenderness, masses, or nipple discharge or inversion. No axillary lymphadenopathy   Abdomen:  Soft, non-tender, nondistended, normoactive bowel sounds,  no masses, no hepatosplenomegaly   Genitalia:  Normal external genitalia without lesions. BUS and vagina normal; no cervical motion tenderness. No abnormal vaginal discharge. +atrophic changes. Uterus and adnexa not enlarged, nontender, no masses. Pap not performed   Rectal:  Normal tone, no masses or tenderness; guaiac negative stool; +external hemorrhoids   Extremities:  No clubbing, cyanosis or edema. See below for knee exam   Pulses:  2+ and symmetric all extremities--somewhat diminished DP on the right, but 2+ PT bilaterally.  Skin:  Skin color, texture, turgor normal, no rashes or lesions. many angiomas, AK's and other benign skin changes.  Lymph nodes:  Cervical, supraclavicular, and axillary nodes normal   Neurologic:  CNII-XII intact, normal strength, sensation and gait; reflexes 2+ and symmetric throughout       Psych:  Normal mood, affect, hygiene and grooming.     Chemistry      Component Value Date/Time   NA 139 10/10/2014 0828   K 4.4 10/10/2014 0828   CL 102 10/10/2014 0828   CO2 27 10/10/2014 0828   BUN 16 10/10/2014 0828   CREATININE 0.92 10/10/2014 0828      Component Value Date/Time   CALCIUM 9.0 10/10/2014 0828   ALKPHOS 47 10/10/2014 0828   AST 16 10/10/2014 0828   ALT 9 10/10/2014 0828   BILITOT 0.5 10/10/2014 0828     Glucose 85  Lab Results  Component Value Date   CHOL 243* 10/10/2014   HDL 78  10/10/2014   LDLCALC 149* 10/10/2014   TRIG 80 10/10/2014   CHOLHDL 3.1 10/10/2014   Lab Results  Component Value Date   TSH 3.237 10/10/2014   Lab Results  Component Value Date   WBC 4.1 10/10/2014   HGB 14.1 10/10/2014   HCT 42.7 10/10/2014   MCV 86.3 10/10/2014   PLT 288 10/10/2014     ASSESSMENT/PLAN:  Annual physical exam - Plan: Visual acuity screening, POCT Urinalysis Dipstick  Hypothyroidism, unspecified hypothyroidism type - adequately replaced - Plan: levothyroxine (SYNTHROID, LEVOTHROID) 88  MCG tablet  Immunization due - Plan: Pneumococcal conjugate vaccine 13-valent  Gastroesophageal reflux disease, esophagitis presence not specified - diet, weight loss and OTC meds reviewed.  Call for Rx if inadequate relief with OTC PPI.  Obesity, Class II, BMI 35-39.9  Pure hypercholesterolemia - low cholesterol diet reviewed. Increase red yeast rice back up to 2/day, and recheck in 6 months - Plan: Hepatic function panel, Lipid panel  Medicare annual wellness visit, subsequent   GERD--diet/precautions reviewed.  Start prilosec 20mg  once daily x 2-4 weeks. If ineffective, call for prescription strength.  If on prescription, then after a month, try decreasing back to the over the counter dose daily, then try and taper to every other day and then prn.  Hyperlipidemia--increase red yeast rice to 2/day, and watch diet more closely.  Obesity--cut back on sweets in the diet.  Losing weight will help your reflux.  Discussed monthly self breast exams and yearly mammograms; at least 30 minutes of aerobic activity at least 5 days/week, weight-bearing exercise 2x/wk; proper sunscreen use reviewed; healthy diet, including goals of calcium and vitamin D intake and alcohol recommendations (less than or equal to 1 drink/day) reviewed; regular seatbelt use; changing batteries in smoke detectors. Immunization recommendations discussed--Prevnar-13 today. Colonoscopy recommendations  reviewed--UTD. Pap smear UTD.  End of Life discussion--has power of attorney and living will.  She desires to be Full Code, Full Care.  F/u 6 months, labs prior   Medicare Attestation I have personally reviewed: The patient's medical and social history Their use of alcohol, tobacco or illicit drugs Their current medications and supplements The patient's functional ability including ADLs,fall risks, home safety risks, cognitive, and hearing and visual impairment Diet and physical activities Evidence for depression or mood disorders  The patient's weight, height, BMI, and visual acuity have been recorded in the chart.  I have made referrals, counseling, and provided education to the patient based on review of the above and I have provided the patient with a written personalized care plan for preventive services.     Raunel Dimartino A, MD   10/12/2014

## 2014-10-12 NOTE — Patient Instructions (Addendum)
HEALTH MAINTENANCE RECOMMENDATIONS:  It is recommended that you get at least 30 minutes of aerobic exercise at least 5 days/week (for weight loss, you may need as much as 60-90 minutes). This can be any activity that gets your heart rate up. This can be divided in 10-15 minute intervals if needed, but try and build up your endurance at least once a week.  Weight bearing exercise is also recommended twice weekly.  Eat a healthy diet with lots of vegetables, fruits and fiber.  "Colorful" foods have a lot of vitamins (ie green vegetables, tomatoes, red peppers, etc).  Limit sweet tea, regular sodas and alcoholic beverages, all of which has a lot of calories and sugar.  Up to 1 alcoholic drink daily may be beneficial for women (unless trying to lose weight, watch sugars).  Drink a lot of water.  Calcium recommendations are 1200-1500 mg daily (1500 mg for postmenopausal women or women without ovaries), and vitamin D 1000 IU daily.  This should be obtained from diet and/or supplements (vitamins), and calcium should not be taken all at once, but in divided doses.  Monthly self breast exams and yearly mammograms for women over the age of 31 is recommended.  Sunscreen of at least SPF 30 should be used on all sun-exposed parts of the skin when outside between the hours of 10 am and 4 pm (not just when at beach or pool, but even with exercise, golf, tennis, and yard work!)  Use a sunscreen that says "broad spectrum" so it covers both UVA and UVB rays, and make sure to reapply every 1-2 hours.  Remember to change the batteries in your smoke detectors when changing your clock times in the spring and fall.  Use your seat belt every time you are in a car, and please drive safely and not be distracted with cell phones and texting while driving.  Start prilosec 20mg  once daily x 2-4 weeks. If ineffective, call for prescription strength.  If on prescription, then after a month, try decreasing back to the over the  counter dose daily, then try and taper to every other day and then as needed (ie prior to meals that you know will trigger heartburn--spicy, large meals, etc).  Food Choices for Gastroesophageal Reflux Disease When you have gastroesophageal reflux disease (GERD), the foods you eat and your eating habits are very important. Choosing the right foods can help ease the discomfort of GERD. WHAT GENERAL GUIDELINES DO I NEED TO FOLLOW?  Choose fruits, vegetables, whole grains, low-fat dairy products, and low-fat meat, fish, and poultry.  Limit fats such as oils, salad dressings, butter, nuts, and avocado.  Keep a food diary to identify foods that cause symptoms.  Avoid foods that cause reflux. These may be different for different people.  Eat frequent small meals instead of three large meals each day.  Eat your meals slowly, in a relaxed setting.  Limit fried foods.  Cook foods using methods other than frying.  Avoid drinking alcohol.  Avoid drinking large amounts of liquids with your meals.  Avoid bending over or lying down until 2-3 hours after eating. WHAT FOODS ARE NOT RECOMMENDED? The following are some foods and drinks that may worsen your symptoms: Vegetables Tomatoes. Tomato juice. Tomato and spaghetti sauce. Chili peppers. Onion and garlic. Horseradish. Fruits Oranges, grapefruit, and lemon (fruit and juice). Meats High-fat meats, fish, and poultry. This includes hot dogs, ribs, ham, sausage, salami, and bacon. Dairy Whole milk and chocolate milk. Sour cream. Cream. Butter.  Ice cream. Cream cheese.  Beverages Coffee and tea, with or without caffeine. Carbonated beverages or energy drinks. Condiments Hot sauce. Barbecue sauce.  Sweets/Desserts Chocolate and cocoa. Donuts. Peppermint and spearmint. Fats and Oils High-fat foods, including [place] fries and potato chips. Other Vinegar. Strong spices, such as black pepper, white pepper, red pepper, cayenne, curry powder,  cloves, ginger, and chili powder. The items listed above may not be a complete list of foods and beverages to avoid. Contact your dietitian for more information. Document Released: 09/15/2005 Document Revised: 09/20/2013 Document Reviewed: 07/20/2013 [facility] Patient Information 2015 [place], [place]. This information is not intended to replace advice given to you by your health care provider. Make sure you discuss any questions you have with your health care provider.  High cholesterol--increase red yeast rice to 2/day, and watch diet more closely.  Please cut back on sweets in the diet.  Losing weight will help your reflux (and arthritis pain, risk for problems with diabetes, blood pressure, heart disease, etc!)  Fat and Cholesterol Control Diet Fat and cholesterol levels in your blood and organs are influenced by your diet. High levels of fat and cholesterol may lead to diseases of the heart, small and large blood vessels, gallbladder, liver, and pancreas. CONTROLLING FAT AND CHOLESTEROL WITH DIET Although exercise and lifestyle factors are important, your diet is key. That is because certain foods are known to raise cholesterol and others to lower it. The goal is to balance foods for their effect on cholesterol and more importantly, to replace saturated and trans fat with other types of fat, such as monounsaturated fat, polyunsaturated fat, and omega-3 fatty acids. On average, a person should consume no more than 15 to 17 g of saturated fat daily. Saturated and trans fats are considered "bad" fats, and they will raise LDL cholesterol. Saturated fats are primarily found in animal products such as meats, butter, and cream. However, that does not mean you need to give up all your favorite foods. Today, there are good tasting, low-fat, low-cholesterol substitutes for most of the things you like to eat. Choose low-fat or nonfat alternatives. Choose round or loin cuts of red meat. These types of cuts are  lowest in fat and cholesterol. Chicken (without the skin), fish, veal, and ground [place] breast are great choices. Eliminate fatty meats, such as hot dogs and salami. Even shellfish have little or no saturated fat. Have a 3 oz (85 g) portion when you eat lean meat, poultry, or fish. Trans fats are also called "partially hydrogenated oils." They are oils that have been scientifically manipulated so that they are solid at room temperature resulting in a longer shelf life and improved taste and texture of foods in which they are added. Trans fats are found in stick margarine, some tub margarines, cookies, crackers, and baked goods.  When baking and cooking, oils are a great substitute for butter. The monounsaturated oils are especially beneficial since it is believed they lower LDL and raise HDL. The oils you should avoid entirely are saturated tropical oils, such as coconut and palm.  Remember to eat a lot from food groups that are naturally free of saturated and trans fat, including fish, fruit, vegetables, beans, grains (barley, rice, couscous, bulgur wheat), and pasta (without cream sauces).  IDENTIFYING FOODS THAT LOWER FAT AND CHOLESTEROL  Soluble fiber may lower your cholesterol. This type of fiber is found in fruits such as apples, vegetables such as broccoli, potatoes, and carrots, legumes such as beans, peas, and lentils,  and grains such as barley. Foods fortified with plant sterols (phytosterol) may also lower cholesterol. You should eat at least 2 g per day of these foods for a cholesterol lowering effect.  Read package labels to identify low-saturated fats, trans fat free, and low-fat foods at the supermarket. Select cheeses that have only 2 to 3 g saturated fat per ounce. Use a heart-healthy tub margarine that is free of trans fats or partially hydrogenated oil. When buying baked goods (cookies, crackers), avoid partially hydrogenated oils. Breads and muffins should be made from whole grains  (whole-wheat or whole oat flour, instead of "flour" or "enriched flour"). Buy non-creamy canned soups with reduced salt and no added fats.  FOOD PREPARATION TECHNIQUES  Never deep-fry. If you must fry, either stir-fry, which uses very little fat, or use non-stick cooking sprays. When possible, broil, bake, or roast meats, and steam vegetables. Instead of putting butter or margarine on vegetables, use lemon and herbs, applesauce, and cinnamon (for squash and sweet potatoes). Use nonfat yogurt, salsa, and low-fat dressings for salads.  LOW-SATURATED FAT / LOW-FAT FOOD SUBSTITUTES Meats / Saturated Fat (g)  Avoid: Steak, marbled (3 oz/85 g) / 11 g  Choose: Steak, lean (3 oz/85 g) / 4 g  Avoid: Hamburger (3 oz/85 g) / 7 g  Choose: Hamburger, lean (3 oz/85 g) / 5 g  Avoid: Ham (3 oz/85 g) / 6 g  Choose: Ham, lean cut (3 oz/85 g) / 2.4 g  Avoid: Chicken, with skin, dark meat (3 oz/85 g) / 4 g  Choose: Chicken, skin removed, dark meat (3 oz/85 g) / 2 g  Avoid: Chicken, with skin, light meat (3 oz/85 g) / 2.5 g  Choose: Chicken, skin removed, light meat (3 oz/85 g) / 1 g Dairy / Saturated Fat (g)  Avoid: Whole milk (1 cup) / 5 g  Choose: Low-fat milk, 2% (1 cup) / 3 g  Choose: Low-fat milk, 1% (1 cup) / 1.5 g  Choose: Skim milk (1 cup) / 0.3 g  Avoid: Hard cheese (1 oz/28 g) / 6 g  Choose: Skim milk cheese (1 oz/28 g) / 2 to 3 g  Avoid: Cottage cheese, 4% fat (1 cup) / 6.5 g  Choose: Low-fat cottage cheese, 1% fat (1 cup) / 1.5 g  Avoid: Ice cream (1 cup) / 9 g  Choose: Sherbet (1 cup) / 2.5 g  Choose: Nonfat frozen yogurt (1 cup) / 0.3 g  Choose: Frozen fruit bar / trace  Avoid: Whipped cream (1 tbs) / 3.5 g  Choose: Nondairy whipped topping (1 tbs) / 1 g Condiments / Saturated Fat (g)  Avoid: Mayonnaise (1 tbs) / 2 g  Choose: Low-fat mayonnaise (1 tbs) / 1 g  Avoid: Butter (1 tbs) / 7 g  Choose: Extra light margarine (1 tbs) / 1 g  Avoid: Coconut oil (1 tbs)  / 11.8 g  Choose: Olive oil (1 tbs) / 1.8 g  Choose: Corn oil (1 tbs) / 1.7 g  Choose: Safflower oil (1 tbs) / 1.2 g  Choose: Sunflower oil (1 tbs) / 1.4 g  Choose: Soybean oil (1 tbs) / 2.4 g  Choose: Canola oil (1 tbs) / 1 g Document Released: 09/15/2005 Document Revised: 01/10/2013 Document Reviewed: 12/14/2013 [employer] Patient Information 2015 [place], [place]. This information is not intended to replace advice given to you by your health care provider. Make sure you discuss any questions you have with your health care provider.

## 2014-11-08 ENCOUNTER — Telehealth: Payer: Self-pay | Admitting: Family Medicine

## 2014-11-08 ENCOUNTER — Encounter: Payer: Self-pay | Admitting: Family Medicine

## 2014-11-08 ENCOUNTER — Ambulatory Visit (INDEPENDENT_AMBULATORY_CARE_PROVIDER_SITE_OTHER): Payer: Medicare Other | Admitting: Family Medicine

## 2014-11-08 VITALS — BP 138/86 | HR 68 | Temp 98.1°F | Ht 66.0 in | Wt 216.0 lb

## 2014-11-08 DIAGNOSIS — N3000 Acute cystitis without hematuria: Secondary | ICD-10-CM

## 2014-11-08 DIAGNOSIS — M545 Low back pain: Secondary | ICD-10-CM

## 2014-11-08 DIAGNOSIS — R350 Frequency of micturition: Secondary | ICD-10-CM

## 2014-11-08 LAB — POCT URINALYSIS DIPSTICK
Bilirubin, UA: NEGATIVE
Blood, UA: NEGATIVE
GLUCOSE UA: NEGATIVE
Ketones, UA: NEGATIVE
Nitrite, UA: NEGATIVE
Protein, UA: NEGATIVE
Spec Grav, UA: 1.015
Urobilinogen, UA: NEGATIVE
pH, UA: 8

## 2014-11-08 MED ORDER — NITROFURANTOIN MONOHYD MACRO 100 MG PO CAPS: 100.0000 mg | ORAL_CAPSULE | Freq: Two times a day (BID) | ORAL | Status: DC

## 2014-11-08 NOTE — Telephone Encounter (Signed)
I'm hesitant to use Cipro since she has levaquin listed as allergy.  She thinks she passed out, so ?if true allergy.  I think it is questionable as to whether she truly has UTI.  If symptoms aren't terrible, she can try and wait for at least preliminary culture to show whether there is any infection (vs starting the macrobid now, or risking taking cipro, and being cautious to look for any side effects)

## 2014-11-08 NOTE — Telephone Encounter (Signed)
Spoke with pharmacist and the nitrofurantoin is the same as the macrobid-the $47 was the price for the generic. The only other cheaper alternative could be cipro BID x 7 days $22. Do you want to change or just have her get the generic macrobid? Please advise.

## 2014-11-08 NOTE — Telephone Encounter (Signed)
Called [facility] and insurance not covering Macrobid cost $47, they changed to Bactrim and not covering it either but cost is $12.29, can you switch to Bactrim?

## 2014-11-08 NOTE — Telephone Encounter (Signed)
Explained all to patient and she agreed to wait until culture comes back-she will call if her symptoms worsen.

## 2014-11-08 NOTE — Patient Instructions (Signed)
Start the antibiotics to treat presumptive bladder infection. We will let you know in a couple of days what the urine culture shows, and if you need to stop or change the antibiotic.  Use Vagisil (topical medication) for vaginal irritation (don't buy the Vagisil for yeast infection--you do not have yeast infection).  Your tenderness at your back is at the sacro-iliac joint.  Use heat, tylenol or aleve as needed for pain.

## 2014-11-08 NOTE — Progress Notes (Signed)
Chief Complaint  Patient presents with  . Back Pain    started this past Sat-gone Sunday. Came back this week and has been a nagging pain, up to use bathroom all night last night. Wonders if she may have a UTI?   She is complaining of pain in her lower back, on either side, right below the waist. Sometimes the pain spreads across the whole lower back.  She has "little back aches" on and off all the time.  4 days ago she woke up with more severe pain in the lower back.  Denies change in activity, falls, trauma.  Thought she "slept on it wrong".  She took Aleve, and the next day it was improved, just some residual pain.  She urinates frequently in general, but last night she was up 6 times, up every hour.  She presents today to rule out urinary tract infection.  Denies any change in flu intake, doesn't drink after 7.  She had coffee in the afternoon, a glass of wine at lunch, which is not unusual for her.  During the day today, she is still having some persistent frequency, and voiding small amounts. Denies any odor/cloudiness/hematuria.  She is having slight burning with urination.  Denies abdominal pain, flank pain, no fevers, chills. Denies vaginal discharge. She does have some irritation/soreness on her left side of vaginal area.  Denies new products, trauma.  GERD: symptoms resolved since she started taking prilosec once daily.  PMH, PSH and SH reviewed.  Outpatient Encounter Prescriptions as of 11/08/2014  Medication Sig Note  . calcium carbonate (OS-CAL) 600 MG TABS Take 600 mg by mouth daily.     . calcium carbonate (TUMS - DOSED IN MG ELEMENTAL CALCIUM) 500 MG chewable tablet Chew 1-2 tablets by mouth as needed for indigestion or heartburn. 10/12/2014: Using once or twice daily recently due to frequent heartburn  . Cholecalciferol (VITAMIN D) 2000 UNITS tablet Take 2,000 Units by mouth daily.   . fish oil-omega-3 fatty acids 1000 MG capsule Take 1 g by mouth daily.    [name]  glucosamine-chondroitin 500-400 MG tablet Take 1 tablet by mouth 2 (two) times daily.    [name] levothyroxine (SYNTHROID, LEVOTHROID) 88 MCG tablet Take 1 tablet (88 mcg total) by mouth daily before breakfast.   . Magnesium 400 MG CAPS Take 1 capsule by mouth daily.     . Multiple Vitamin (MULTI-VITAMIN) tablet Take 1 tablet by mouth daily.     [name] omeprazole (PRILOSEC) 20 MG capsule Take 20 mg by mouth daily.   . Red Yeast Rice Extract (RED YEAST RICE PO) Take 1 tablet by mouth daily.   . naproxen sodium (ANAPROX) 220 MG tablet Take 220 mg by mouth as needed. 04/06/2014: Uses only as needed (usually just with travel), not regularly   Allergies  Allergen Reactions  . Levaquin [Levofloxacin Hemihydrate] Other (See Comments)  . Promethazine Hcl Other (See Comments)  . Sulfa Drugs Cross Reactors Swelling   She thinks she fainted from levaquin (several years ago).  She recalls taking cipro in the past.  ROS: no fevers, chills headaches, dizziness, chest pain, URI symptoms, shortness of breath, nausea, vomiting, bleeding, bruising, numbness, tingling  or other complaints except as per HPI.  PHYSICAL EXAM: BP 138/86 mmHg  Pulse 68  Temp(Src) 98.1 F (36.7 C) (Tympanic)  Ht 5\' 6"  (1.676 m)  Wt 216 lb (97.977 kg)  BMI 34.88 kg/m2  Well developed, pleasant female in no distress Back: Mild tenderness at R SI joint.  Spine and CVA are nontender Abdomen: soft, nontender, no mass External genitalia: Mild erythema at left introitus, irritated. No discharge, ulcers or other abnormalities noted. Mild atrophic changes.  Urine dip:  Trace leuks, otherwise negative (u/a done at physical 1/14 also showed trace leuks, was symptomatic at that time).  ASSESSMENT/PLAN:  Urinary frequency - r/o UTI - Plan: Urine culture  Low back pain, unspecified back pain laterality, with sciatica presence unspecified - tender at SI joint.  heat, stretch, NSAIDs.  consider chiro if acutely worse in this area - Plan: POCT  Urinalysis Dipstick  Acute cystitis without hematuria - Plan: nitrofurantoin, macrocrystal-monohydrate, (MACROBID) 100 MG capsule   Treat presumptively for UTI given symptoms of urgency and frequency. Treat with macrobid while awaiting culture and sensitivity.  Consider using cipro if ABX needs to be changed (see above).  vagisil prn for irritation.

## 2014-11-08 NOTE — Telephone Encounter (Signed)
No.  She has a sulfa allergy documented in her chart.  NO BACTRIM. Okay for generic nitrofurantoin 100mg  BID (doesn't have to be macrobid).  See if that helps.

## 2014-11-10 ENCOUNTER — Other Ambulatory Visit: Payer: Self-pay | Admitting: Family Medicine

## 2014-11-10 LAB — URINE CULTURE

## 2014-11-10 MED ORDER — CEPHALEXIN 500 MG PO CAPS: 500.0000 mg | ORAL_CAPSULE | Freq: Two times a day (BID) | ORAL | Status: DC

## 2014-11-14 ENCOUNTER — Telehealth: Payer: Self-pay | Admitting: Family Medicine

## 2014-11-14 NOTE — Telephone Encounter (Signed)
Pt was put on keflex for UTI on 2/12 (see result notes from urine culture).  Not sure why [facility] is requesting this--pt did not recently receive Cipro (wondering when this was from).  Please check with patient and see if she took Keflex, if her symptoms are improved/improving, and why we got this request

## 2014-11-14 NOTE — Telephone Encounter (Signed)
Patient states that she is taking the Keflex and she is not seeing much improvement yet. She said that she has a few more days left of the medication. Patient states that she didn't order refills on the Cipro. She has no idea why they would have called and ask for the refill.

## 2014-11-14 NOTE — Telephone Encounter (Signed)
[facility] on [address] req refill on Cipro 500 mg 1 po bid  #14

## 2015-04-04 ENCOUNTER — Other Ambulatory Visit: Payer: Self-pay

## 2015-04-04 ENCOUNTER — Telehealth: Payer: Self-pay | Admitting: Family Medicine

## 2015-04-04 DIAGNOSIS — Z1231 Encounter for screening mammogram for malignant neoplasm of breast: Secondary | ICD-10-CM

## 2015-04-04 NOTE — Telephone Encounter (Signed)
Pt.notified

## 2015-04-04 NOTE — Telephone Encounter (Signed)
Advise pt that bone density is NOT necessary.  She had one last year and it was normal.

## 2015-04-04 NOTE — Telephone Encounter (Signed)
Pt is getting ready to schedule her mammogram and pt wants to know if Dr Tomi Bamberger recommends that she gets a bone density done as well

## 2015-04-17 ENCOUNTER — Other Ambulatory Visit: Payer: Medicare Other

## 2015-04-17 DIAGNOSIS — E7800 Pure hypercholesterolemia, unspecified: Secondary | ICD-10-CM

## 2015-04-17 LAB — HEPATIC FUNCTION PANEL
ALBUMIN: 3.6 g/dL (ref 3.5–5.2)
ALK PHOS: 40 U/L (ref 39–117)
ALT: 13 U/L (ref 0–35)
AST: 22 U/L (ref 0–37)
Bilirubin, Direct: 0.1 mg/dL (ref 0.0–0.3)
Indirect Bilirubin: 0.4 mg/dL (ref 0.2–1.2)
Total Bilirubin: 0.5 mg/dL (ref 0.2–1.2)
Total Protein: 6.6 g/dL (ref 6.0–8.3)

## 2015-04-17 LAB — LIPID PANEL
CHOL/HDL RATIO: 3.3 ratio
Cholesterol: 187 mg/dL (ref 0–200)
HDL: 56 mg/dL (ref >=46)
LDL CALC: 114 mg/dL — AB (ref 0–99)
Triglycerides: 87 mg/dL (ref <150)
VLDL: 17 mg/dL (ref 0–40)

## 2015-04-17 NOTE — Addendum Note (Signed)
Addended by: Randel Books on: 04/17/2015 09:01 AM   Modules accepted: Orders

## 2015-04-19 ENCOUNTER — Encounter: Payer: Self-pay | Admitting: Family Medicine

## 2015-04-19 ENCOUNTER — Ambulatory Visit (INDEPENDENT_AMBULATORY_CARE_PROVIDER_SITE_OTHER): Payer: Medicare Other | Admitting: Family Medicine

## 2015-04-19 VITALS — BP 120/72 | HR 80 | Ht 66.25 in | Wt 208.0 lb

## 2015-04-19 DIAGNOSIS — M545 Low back pain, unspecified: Secondary | ICD-10-CM

## 2015-04-19 DIAGNOSIS — Z5181 Encounter for therapeutic drug level monitoring: Secondary | ICD-10-CM | POA: Diagnosis not present

## 2015-04-19 DIAGNOSIS — E7800 Pure hypercholesterolemia, unspecified: Secondary | ICD-10-CM

## 2015-04-19 DIAGNOSIS — E039 Hypothyroidism, unspecified: Secondary | ICD-10-CM | POA: Diagnosis not present

## 2015-04-19 DIAGNOSIS — M6289 Other specified disorders of muscle: Secondary | ICD-10-CM | POA: Diagnosis not present

## 2015-04-19 DIAGNOSIS — K219 Gastro-esophageal reflux disease without esophagitis: Secondary | ICD-10-CM | POA: Diagnosis not present

## 2015-04-19 DIAGNOSIS — M1710 Unilateral primary osteoarthritis, unspecified knee: Secondary | ICD-10-CM

## 2015-04-19 DIAGNOSIS — E669 Obesity, unspecified: Secondary | ICD-10-CM | POA: Diagnosis not present

## 2015-04-19 DIAGNOSIS — M129 Arthropathy, unspecified: Secondary | ICD-10-CM

## 2015-04-19 NOTE — Patient Instructions (Signed)
Keep up the good work with diet, exercise and weight loss. Your cholesterol was much better--continue the 3 red yeast rice daily. Let us know if you don't hear anything about a physical therapy appointment by next week. Let us know if your tingling in your feet is getting worse. Use the Aleve regularly for up to a week with flares of back pain, rather than just very infrequently.  Heat and massage and stretches will also help.

## 2015-04-19 NOTE — Progress Notes (Signed)
Chief Complaint  Patient presents with  . Hypothyroidism    nonfasting med check, labs already done. Also, still has right sided pain that mentioned at her annual exam in January-has not gotten any better.     She was last here in February with complaints of back pain and urinary frequency. She was rx'd nitrofurantoin at visit, but apparently it wasn't covered, so it was changed to Keflex (by Audelia Acton).  Culture showed >100K multiple bacterial morphotypes. The urinary frequency is back to her norm, getting up a few times a night.  She continues to have some pain in the right lower back, which bothers her at night in bed, and when she gets up from sitting.  If it gets bad she takes an Aleve, only about once a month.  For a while when it was flaring she used heat once, which helped. This has been ongoing since January/February.   She is also having a hard time getting out of chairs without needing to push herself up, feels like she has some weakness, and problems with balance.  GERD:  At last visit 6 months ago, she had been having daily symptoms.  She hasn't needed to take anything in the last 6 weeks or so.  She made some changes to her diet, cut out a lot of sweets, carbs. She is eating less, moving more. Walks daily, and water exercises 3x/week.  She got a Fitbit and gets 5-8K daily.  She has lost 10# since her physical.  Hyperlipidemia:  She has been taking red yeast rice 3/day (1 in the morning, 2 at dinner), up from once daily prior to last visit (after reading the recommended dose on the bottle).   She admits to still having cheese and ice cream more than she should, but limits eggs, red meat just 2x/week.  Hypothyroid--denies fatigue, changes in hair/skin/bowels/moods.Has some constipation, that is usually pretty controlled.  TSH was not rechecked with recent labs, due to stable values, checked 6 mos ago (see below). +intentional weight loss (see above).  Left knee pain--she has appointment with  Dr. Wynelle Link next week. She has done well with injection in the past, helped for about a year, and plans for another.  PMH, PSH, SH reviewed/updated  Outpatient Encounter Prescriptions as of 04/19/2015  Medication Sig Note  . calcium carbonate (OS-CAL) 600 MG TABS Take 600 mg by mouth daily.     . Cholecalciferol (VITAMIN D) 2000 UNITS tablet Take 2,000 Units by mouth daily.   . fish oil-omega-3 fatty acids 1000 MG capsule Take 1 g by mouth daily.    [name] glucosamine-chondroitin 500-400 MG tablet Take 1 tablet by mouth 2 (two) times daily.    [name] levothyroxine (SYNTHROID, LEVOTHROID) 88 MCG tablet Take 1 tablet (88 mcg total) by mouth daily before breakfast.   . Magnesium 400 MG CAPS Take 1 capsule by mouth daily.     . Multiple Vitamin (MULTI-VITAMIN) tablet Take 1 tablet by mouth daily.     . Red Yeast Rice Extract (RED YEAST RICE PO) Take 1 tablet by mouth 3 (three) times daily.  04/19/2015: Takes 1 in the morning, 2 with dinner  . calcium carbonate (TUMS - DOSED IN MG ELEMENTAL CALCIUM) 500 MG chewable tablet Chew 1-2 tablets by mouth as needed for indigestion or heartburn. 04/19/2015: Using prn, infrequently  . naproxen sodium (ANAPROX) 220 MG tablet Take 220 mg by mouth as needed. 04/06/2014: Uses only as needed (usually just with travel), not regularly  . [DISCONTINUED] cephALEXin (  KEFLEX) 500 MG capsule Take 1 capsule (500 mg total) by mouth 2 (two) times daily.   . [DISCONTINUED] nitrofurantoin, macrocrystal-monohydrate, (MACROBID) 100 MG capsule Take 1 capsule (100 mg total) by mouth 2 (two) times daily.   . [DISCONTINUED] omeprazole (PRILOSEC) 20 MG capsule Take 20 mg by mouth daily.    Facility-Administered Encounter Medications as of 04/19/2015  Medication  . influenza  inactive virus vaccine (FLUZONE/FLUARIX) injection 0.5 mL   Allergies  Allergen Reactions  . Levaquin [Levofloxacin Hemihydrate] Other (See Comments)  . Promethazine Hcl Other (See Comments)  . Sulfa Drugs Cross Reactors  Swelling   ROS: no fever, chills, URI symptoms, cough, shortness of breath, chest pain, palpitations, edema. Mild vertigo if she gets up too quickly in the middle of the night, better if slower. Unchanged.  She feel like it is more related to balance.  She hasn't had any falls. Some trouble getting out of chair unassisted. No depression, bleeding, bruising, rashes or other concerns. +intentional weight loss. See HPI.  PHYSICAL EXAM: BP 120/72 mmHg  Pulse 80  Ht 5' 6.25" (1.683 m)  Wt 208 lb (94.348 kg)  BMI 33.31 kg/m2 Well developed, pleasant, overweight female in no distress. HEENT: PERRL, EOMI, conjunctiva clear, OP clear Neck: no lymphadenopathy, thyromegaly or carotid bruit Heart: regular rate and rhythm without murmur Lungs: clear bilaterally Back: Spine nontender. No SI joint tenderness or CVA tenderness. Area of discomfort is in the paraspinous muscles just above the level of right iliac crest, with no significant spasm. Extremities: no edema, 2+ pulses  Lab Results  Component Value Date   CHOL 187 04/17/2015   HDL 56 04/17/2015   LDLCALC 114* 04/17/2015   TRIG 87 04/17/2015   CHOLHDL 3.3 04/17/2015   Lab Results  Component Value Date   ALT 13 04/17/2015   AST 22 04/17/2015   ALKPHOS 40 04/17/2015   BILITOT 0.5 04/17/2015   Lab Results  Component Value Date   TSH 3.237 10/10/2014   And TSH 03/2014 was 2.197  ASSESSMENT/PLAN:  Pure hypercholesterolemia - significantly improved.  continue 3 fish oil daily, healthy diet, exercise  Hypothyroidism, unspecified hypothyroidism type - euthyroid by history; continue current dose  Gastroesophageal reflux disease without esophagitis - improved with weight loss and dietary changes. continue prn use of meds  Obesity (BMI 30-39.9) - further weight loss encouraged  Arthritis of knee - f/u with Dr. Wynelle Link as scheduled. continue water aerobics and weight loss   Obesity--continue with diet, exercise.  Congratulated on  weight loss, continue Hyperlipidemia--significantly improved with higher dose of red yeast rice and dietary improvements.  Refer to PT--help with strength, getting out of chairs, balance and back pain.  CPE/AWV in 6 mos with labs prior

## 2015-05-03 ENCOUNTER — Ambulatory Visit
Admission: RE | Admit: 2015-05-03 | Discharge: 2015-05-03 | Disposition: A | Discharge status: Home / Self Care | Payer: Medicare Other | Source: Ambulatory Visit

## 2015-05-03 DIAGNOSIS — Z1231 Encounter for screening mammogram for malignant neoplasm of breast: Secondary | ICD-10-CM

## 2015-05-09 ENCOUNTER — Ambulatory Visit: Payer: Medicare Other | Attending: Family Medicine | Admitting: Physical Therapy

## 2015-05-09 ENCOUNTER — Encounter: Payer: Self-pay | Admitting: Physical Therapy

## 2015-05-09 DIAGNOSIS — M2560 Stiffness of unspecified joint, not elsewhere classified: Secondary | ICD-10-CM | POA: Insufficient documentation

## 2015-05-09 DIAGNOSIS — M25551 Pain in right hip: Secondary | ICD-10-CM

## 2015-05-09 DIAGNOSIS — R29898 Other symptoms and signs involving the musculoskeletal system: Secondary | ICD-10-CM | POA: Diagnosis present

## 2015-05-09 DIAGNOSIS — M545 Low back pain, unspecified: Secondary | ICD-10-CM

## 2015-05-09 NOTE — Patient Instructions (Signed)
Hip Extension (Prone)   Lift left leg _6-10___ inches from floor, keeping knee locked. Repeat __10__ times per set. Do ___1_ sets per session. Do ___1_ sessions per day. Abdominals IN  http://orth.exer.us/98   Copyright  VHI. All rights reserved.  Hip Adduction: Leg Lift (Eccentric) - Side-Lying   Lie on side with top leg bent, foot flat behind lower leg. Quickly lift lower leg. Slowly lower for 3-5 seconds. _10__ reps per set, __1_ sets per day, __5_ days per week. Keep pelvis level and abdominals   Copyright  VHI. All rights reserved.  Abduction: Side Leg Lift (Eccentric) - Side-Lying   Lie on side. Lift top leg slightly higher than shoulder level. Keep top leg straight with body, toes pointing forward. Slowly lower for 3-5 seconds. __10_ reps per set, __1_ sets per day, _5__ days per week.   Copyright  VHI. All rights reserved.  Strengthening: Straight Leg Raise (Phase 1)   Tighten muscles on front of right thigh, then lift leg __10-12__ inches from surface, keeping knee locked.  Repeat _10___ times per set. Do ___1_ sets per session. Do __1__ sessions per day.  http://orth.exer.us/614   Copyright  VHI. All rights reserved.

## 2015-05-10 NOTE — Therapy (Signed)
[place], [place], [address] Phone: [phone]   Fax:  [phone]  Physical Therapy Evaluation  Patient Details  Name: Kayla Guerrero MRN: [id number] Date of Birth: 08/08/1935 Referring Provider:  Rita Ohara, MD  Encounter Date: 05/09/2015      PT End of Session - 05/09/15 1539    Visit Number 1   Number of Visits 8   Date for PT Re-Evaluation 07/04/15   PT Start Time 1202   PT Stop Time 1245   PT Time Calculation (min) 43 min   Activity Tolerance Patient tolerated treatment well   Behavior During Therapy [facility] for tasks assessed/performed      Past Medical History  Diagnosis Date  . GERD (gastroesophageal reflux disease)   . Obesity   . Borderline osteopenia DEXA 2006 and 2012  . Cholesterol serum elevated   . FHx: colon cancer   . Colon polyps   . Cataract   . Arthritis of knee, left     Past Surgical History  Procedure Laterality Date  . Cholecystectomy    . Tubal ligation    . Colonoscopy  2010, 04/2013    Buccini  . Cataract extraction, bilateral    . Pterygium excision      There were no vitals filed for this visit.  Visit Diagnosis:  Right-sided low back pain without sciatica - Plan: PT plan of care cert/re-cert  Joint stiffness of spine - Plan: PT plan of care cert/re-cert  Hip pain, right - Plan: PT plan of care cert/re-cert  Weakness of right hip - Plan: PT plan of care cert/re-cert      Subjective Assessment - 05/09/15 1203    Subjective Patient has ongoing chronic Rt. low back pain, has been present for over 6 mos.  She reports difficulty with sit to stand, occ numbness in feet,  Rt. LE (knee) weakness. She is having knee injection today.  Was here for PT last yr. with some benefit.    Pertinent History Knee pain bilat L>R   Limitations House hold activities;Standing;Walking   How long can you sit comfortably? NA   How long can you stand comfortably? 30-40 min     How long can you walk comfortably? 30 min with min pain    Diagnostic tests Knees   Patient Stated Goals Rid of back pain, get stronger   Currently in Pain? Yes  stiffness, wakes with mod pain, none sitting   Pain Score 1    Pain Location Back   Pain Orientation Right;Posterior;Lower   Pain Descriptors / Indicators Aching;Dull   Pain Type Chronic pain  > 77mos   Pain Radiating Towards none   Pain Onset More than a month ago   Pain Frequency Intermittent   Aggravating Factors  standing still    Pain Relieving Factors sitting    Effect of Pain on Daily Activities painful normal daily activities   Multiple Pain Sites No            [facility] PT Assessment - 05/09/15 1210    Assessment   Medical Diagnosis Low back pain Rt. and balance    Onset Date/Surgical Date --  6-12 mos   Precautions   Precautions None   Restrictions   Weight Bearing Restrictions No   Balance Screen   Has the patient fallen in the past 6 months No   Has the patient had a decrease in activity level because of a fear of falling?  No  cautious   Is the patient reluctant to leave their home because of a fear of falling?  No   Home Ec[employer] residence   Home Access Stairs to enter   Entrance Stairs-Number of Steps 2   Prior Function   Level of Independence Independent   Cognition   Overall Cognitive Status Within Functional Limits for tasks assessed   Sensation   Light Touch Appears Intact   Coordination   Gross Motor Movements are Fluid and Coordinated Not tested   Single Leg Stance   Comments decr bilat <5 sec   Sit to Stand   Comments standard chir 18.5 inches cna do without UE assist   Posture/Postural Control   Posture/Postural Control Postural limitations   Postural Limitations Rounded Shoulders;Forward head;Decreased lumbar lordosis   AROM   Lumbar Flexion touches toes   Lumbar Extension WNL   Lumbar - Right Side Bend WNL  pain R   Lumbar - Left Side Bend WNL   pain R   Lumbar - Right Rotation WNL  pain R   Lumbar - Left Rotation WNL   Strength   Right Hip Flexion 4+/5   Right Hip Extension 4-/5   Right Hip ABduction 3+/5   Left Hip Flexion 4/5   Left Hip Extension 4-/5   Left Hip ABduction 3+/5   Right Knee Flexion 4+/5   Right Knee Extension 5/5   Left Knee Flexion 3+/5   Left Knee Extension 5/5   Right Ankle Dorsiflexion 4+/5   Left Ankle Dorsiflexion 4+/5   Flexibility   Hamstrings approx 60-65 deg   Quadriceps tight in prone knee flexion   Palpation   Spinal mobility min pain with P/A pressure L3-L4-L5   SI assessment  no pain with compression   Palpation comment pain, sore Rt. QL muscle   Timed Up and Go Test   Normal TUG (seconds) 8   Cognitive TUG (seconds) 8.5      HEP: 4 way SLR and Rt. QL stretch       PT Education - 05/09/15 1537    Education provided Yes   Education Details PT/POC, HEP and QL muscle function, balance testing   Person(s) Educated Patient   Methods Explanation;Demonstration;Handout   Comprehension Verbalized understanding;Returned demonstration;Need further instruction          PT Short Term Goals - 05/09/15 1547    PT SHORT TERM GOAL #1   Title Pt will be I with HEP   Time 3   Period Weeks   Status New   PT SHORT TERM GOAL #2   Title Pt will complete balance screen Merrilee Jansky ) and set goal   Time 3   Period Weeks   Status New   PT SHORT TERM GOAL #3   Title Pt will be able to report less pain in back with transitioning in bed or on mat.    Time 3   Period Weeks   Status New   PT SHORT TERM GOAL #4   Title Pt will improve sit to stand x5 to less than 20 sec   Time 3   Period Weeks   Status New           PT Long Term Goals - 05/09/15 1548    PT LONG TERM GOAL #1   Title Pt will be able to demo/understand concepts of good posture and body mechanics as it relates to low back condition.    Time 8   Period  Weeks   Status New   PT LONG TERM GOAL #2   Title Pt will be able to  report stiffness improved 25% in AM, relieved with brief stretching exercises   Time 8   Period Weeks   Status New   PT LONG TERM GOAL #3   Title Pt will be able to sit from chair/recliner at home with only min, occasional difficulty   Time 8   Period Weeks   Status New   PT LONG TERM GOAL #4   Title Pt will demo hip strength to 4+/5 or more throughout to aid in walking, transfers and stairs   Time 8   Period Weeks   Status New   PT LONG TERM GOAL #5   Title Berg balance score to be set once completed    ______/56   Time 8   Period Weeks   Status New               Plan - 05/09/15 1540    Clinical Impression Statement This patient presents with persistent pain in Rt. side of low back which does not radiate past her Rt. hip.  She presents consistent with degenerative process, possibly some nerve impingement in lumbosacral spine.  She had relief of symptoms with decompression of Rt. trunk.  She will beneift from skilled PT to improve proximal hip strength, reduce pain and improve her confidence in balance.     Pt will benefit from skilled therapeutic intervention in order to improve on the following deficits Decreased strength;Postural dysfunction;Improper body mechanics;Decreased mobility;Dizziness;Impaired flexibility;Decreased range of motion;Decreased balance;Hypomobility;Pain;Decreased endurance;Increased fascial restricitons   Rehab Potential Excellent   PT Frequency 1x / week   PT Duration 8 weeks   PT Treatment/Interventions Cryotherapy;Electrical Stimulation;Moist Heat;Ultrasound;Patient/family education;Neuromuscular re-education;Dry needling;Passive range of motion;Balance training;Therapeutic activities;Functional mobility training;Manual techniques;Gait training   PT Next Visit Plan review HEP (4 way hip) and QL stretch, manual to Rt. trunk and modailiteis if necessary   PT Home Exercise Plan see above   Consulted and Agree with Plan of Care Patient           G-Codes - 05-18-15 1143    Functional Assessment Tool Used FOTO   Functional Limitation Changing and maintaining body position   Changing and Maintaining Body Position Current Status (L3810) At least 40 percent but less than 60 percent impaired, limited or restricted   Changing and Maintaining Body Position Goal Status (F7510) At least 20 percent but less than 40 percent impaired, limited or restricted       Problem List Patient Active Problem List   Diagnosis Date Noted  . GERD (gastroesophageal reflux disease) 04/06/2014  . Arthritis of knee 10/06/2013  . Obesity (BMI 30-39.9) 02/16/2013  . Left knee pain 09/15/2012  . Osteopenia 09/15/2012  . Pure hypercholesterolemia 06/11/2011  . Hypothyroidism 06/11/2011    Magdalene Tardiff 05-18-2015, 11:45 AM  [place], [place], [address] Phone: [phone]   Fax:  [phone]   Raeford Razor, PT 2015-05-18 11:46 AM Phone: [phone] Fax: [phone]

## 2015-05-16 ENCOUNTER — Encounter: Payer: Medicare Other | Admitting: Physical Therapy

## 2015-05-23 ENCOUNTER — Ambulatory Visit: Payer: Medicare Other | Admitting: Physical Therapy

## 2015-05-23 DIAGNOSIS — R29898 Other symptoms and signs involving the musculoskeletal system: Secondary | ICD-10-CM

## 2015-05-23 DIAGNOSIS — M545 Low back pain, unspecified: Secondary | ICD-10-CM

## 2015-05-23 DIAGNOSIS — M25551 Pain in right hip: Secondary | ICD-10-CM

## 2015-05-23 DIAGNOSIS — M2560 Stiffness of unspecified joint, not elsewhere classified: Secondary | ICD-10-CM

## 2015-05-23 NOTE — Therapy (Signed)
[place], [place], [address] Phone: [phone]   Fax:  [phone]  Physical Therapy Treatment  Patient Details  Name: Kayla Guerrero MRN: [id number] Date of Birth: 06/20/1935 Referring Provider:  Rita Ohara, MD  Encounter Date: 05/23/2015      PT End of Session - 05/23/15 1156    Visit Number 2   Number of Visits 8   Date for PT Re-Evaluation 07/04/15   PT Start Time 1150   PT Stop Time 1228   PT Time Calculation (min) 38 min   Activity Tolerance Patient tolerated treatment well   Behavior During Therapy [facility] for tasks assessed/performed      Past Medical History  Diagnosis Date  . GERD (gastroesophageal reflux disease)   . Obesity   . Borderline osteopenia DEXA 2006 and 2012  . Cholesterol serum elevated   . FHx: colon cancer   . Colon polyps   . Cataract   . Arthritis of knee, left     Past Surgical History  Procedure Laterality Date  . Cholecystectomy    . Tubal ligation    . Colonoscopy  2010, 04/2013    Buccini  . Cataract extraction, bilateral    . Pterygium excision      There were no vitals filed for this visit.  Visit Diagnosis:  Right-sided low back pain without sciatica  Joint stiffness of spine  Hip pain, right  Weakness of right hip      Subjective Assessment - 05/23/15 1152    Currently in Pain? Yes   Pain Score 2    Pain Location Back   Pain Orientation Right;Left;Lower   Pain Descriptors / Indicators Aching;Dull   Pain Type Chronic pain   Pain Onset More than a month ago   Pain Frequency Intermittent            [facility] Adult PT Treatment/Exercise - 05/23/15 1157    Berg Balance Test   Sit to Stand Able to stand without using hands and stabilize independently   Standing Unsupported Able to stand safely 2 minutes   Sitting with Back Unsupported but Feet Supported on Floor or Stool Able to sit safely and securely 2 minutes   Stand to Sit Sits safely  with minimal use of hands   Transfers Able to transfer safely, minor use of hands   Standing Unsupported with Eyes Closed Able to stand 10 seconds safely   Standing Ubsupported with Feet Together Able to place feet together independently and stand for 1 minute with supervision   From Standing, Reach Forward with Outstretched Arm Can reach confidently >25 cm (10")   From Standing Position, Pick up Object from Floor Able to pick up shoe safely and easily   From Standing Position, Turn to Look Behind Over each Shoulder Looks behind from both sides and weight shifts well   Turn 360 Degrees Able to turn 360 degrees safely in 4 seconds or less   Standing Unsupported, Alternately Place Feet on Step/Stool Able to stand independently and safely and complete 8 steps in 20 seconds   Standing Unsupported, One Foot in Front Able to take small step independently and hold 30 seconds   Standing on One Leg Able to lift leg independently and hold 5-10 seconds   Total Score 52   Lumbar Exercises: Stretches   Single Knee to Chest Stretch 2 reps;30 seconds   Pelvic Tilt 5 reps   Lumbar Exercises: Standing   Wall Slides 10  reps;5 seconds   Other Standing Lumbar Exercises sit to stand x 10 without UE support, x 10 with ball squeeze, x 10 with green band around thighs to challenge lateral hip   Lumbar Exercises: Supine   Straight Leg Raise 10 reps   Lumbar Exercises: Sidelying   Hip Abduction 10 reps   Other Sidelying Lumbar Exercises hip adduction x 10 each LE   Lumbar Exercises: Prone   Straight Leg Raise 10 reps   Other Prone Lumbar Exercises Tr A contract  x 10    Other Prone Lumbar Exercises Tr A with knee flexion x 10 alternating                  PT Short Term Goals - 05/23/15 1429    PT SHORT TERM GOAL #1   Title Pt will be I with HEP   Status On-going   PT SHORT TERM GOAL #2   Title Pt will complete balance screen Merrilee Jansky ) and set goal   Status Achieved   PT SHORT TERM GOAL #3   Title  Pt will be able to report less pain in back with transitioning in bed or on mat.    Status On-going   PT SHORT TERM GOAL #4   Title Pt will improve sit to stand x5 to less than 20 sec   Status On-going           PT Long Term Goals - 05/23/15 1429    PT LONG TERM GOAL #1   Title Pt will be able to demo/understand concepts of good posture and body mechanics as it relates to low back condition.    Status On-going   PT LONG TERM GOAL #2   Title Pt will be able to report stiffness improved 25% in AM, relieved with brief stretching exercises   Status On-going   PT LONG TERM GOAL #3   Title Pt will be able to sit from chair/recliner at home with only min, occasional difficulty   Status On-going   PT LONG TERM GOAL #4   Title Pt will demo hip strength to 4+/5 or more throughout to aid in walking, transfers and stairs   Status On-going   PT LONG TERM GOAL #5   Title Berg balance score to be set once completed    ______/56   Baseline 52/56 on Berg, no goal set, 05/23/15, may choose to do DGI   Status Deferred               Plan - 05/23/15 1427    Clinical Impression Statement Addressed hip strength today, which mildly aggravated low back pain.  No goals met, balance screen completed but further testing warranted to include more dynamic gait components.    PT Next Visit Plan  QL stretch, manual to Rt. trunk and modailiteis if necessary, cont with closed chain, sit to stand   PT Home Exercise Plan 4 way hip   Consulted and Agree with Plan of Care Patient        Problem List Patient Active Problem List   Diagnosis Date Noted  . GERD (gastroesophageal reflux disease) 04/06/2014  . Arthritis of knee 10/06/2013  . Obesity (BMI 30-39.9) 02/16/2013  . Left knee pain 09/15/2012  . Osteopenia 09/15/2012  . Pure hypercholesterolemia 06/11/2011  . Hypothyroidism 06/11/2011    PAA,JENNIFER 05/23/2015, 2:45 PM  [facility] [address] [place], [place], [address] Phone: [phone]   Fax:  [phone]  Raeford Razor, PT 05/23/2015 2:46 PM Phone: [phone] Fax: [phone]

## 2015-05-31 ENCOUNTER — Ambulatory Visit: Payer: Medicare Other | Attending: Family Medicine | Admitting: Physical Therapy

## 2015-05-31 DIAGNOSIS — R29898 Other symptoms and signs involving the musculoskeletal system: Secondary | ICD-10-CM | POA: Diagnosis present

## 2015-05-31 DIAGNOSIS — M545 Low back pain, unspecified: Secondary | ICD-10-CM

## 2015-05-31 DIAGNOSIS — M2560 Stiffness of unspecified joint, not elsewhere classified: Secondary | ICD-10-CM | POA: Diagnosis present

## 2015-05-31 DIAGNOSIS — M25551 Pain in right hip: Secondary | ICD-10-CM

## 2015-05-31 NOTE — Therapy (Signed)
[place], [place], [address] Phone: [phone]   Fax:  [phone]  Physical Therapy Treatment  Patient Details  Name: Kayla Guerrero MRN: [id number] Date of Birth: [birth year] Referring Provider:  Rita Ohara, MD  Encounter Date: 05/31/2015      PT End of Session - 05/31/15 1433    Visit Number 3   Number of Visits 8   Date for PT Re-Evaluation 07/04/15   PT Start Time 0135   PT Stop Time 0225   PT Time Calculation (min) 50 min   Activity Tolerance Patient tolerated treatment well   Behavior During Therapy [facility] for tasks assessed/performed      Past Medical History  Diagnosis Date  . GERD (gastroesophageal reflux disease)   . Obesity   . Borderline osteopenia DEXA 2006 and 2012  . Cholesterol serum elevated   . FHx: colon cancer   . Colon polyps   . Cataract   . Arthritis of knee, left     Past Surgical History  Procedure Laterality Date  . Cholecystectomy    . Tubal ligation    . Colonoscopy  2010, 04/2013    Buccini  . Cataract extraction, bilateral    . Pterygium excision      There were no vitals filed for this visit.  Visit Diagnosis:  Right-sided low back pain without sciatica  Joint stiffness of spine  Hip pain, right  Weakness of right hip      Subjective Assessment - 05/31/15 1500    Subjective Pt. has no pain stated "my back feels pretty good today".   Currently in Pain? No/denies  pain following QL stretch alleviated following massage and end of session           [facility] Adult PT Treatment/Exercise - 05/31/15 1340    Standardized Balance Assessment   Standardized Balance Assessment Dynamic Gait Index   Dynamic Gait Index   Level Surface Normal   Change in Gait Speed Normal   Gait with Horizontal Head Turns Mild Impairment   Gait with Vertical Head Turns Mild Impairment   Gait and Pivot Turn Normal   Step Over Obstacle Normal   Step Around Obstacles Normal    Steps Normal   Total Score 22   Lumbar Exercises: Stretches   Single Knee to Chest Stretch 1 rep;30 seconds   Lower Trunk Rotation 1 rep;30 seconds   Pelvic Tilt 5 reps;10 seconds   Lumbar Exercises: Standing   Other Standing Lumbar Exercises on step, hip drops QL strength each side 1 set x15   Knee/Hip Exercises: Standing   Hip Abduction Stengthening;Both;1 set;15 reps;Knee straight   Abduction Limitations at countertop   Hip Extension Stengthening;Both;1 set;15 reps;Knee straight   Extension Limitations at counter top   Lateral Step Up Both;1 set;15 reps;Hand Hold: 2;Step Height: 4"   Manual Therapy   Manual Therapy Soft tissue mobilization;Passive ROM   Soft tissue mobilization sidelying manual massage to QL, 29min   Passive ROM manual overpressure to QL in sidelying stretch                PT Education - 05/31/15 1433    Education provided Yes   Education Details What and where QL is, HEP   Person(s) Educated Patient   Methods Explanation;Demonstration;Handout   Comprehension Verbalized understanding          PT Short Term Goals - 05/31/15 1436    PT SHORT TERM GOAL #1   Title  Pt will be I with HEP   Time 3   Period Weeks   Status On-going   PT SHORT TERM GOAL #2   Title Pt will complete balance screen Merrilee Jansky ) and set goal   Time 3   Period Weeks   Status Achieved   PT SHORT TERM GOAL #3   Title Pt will be able to report less pain in back with transitioning in bed or on mat.    Time 3   Period Weeks   Status On-going   PT SHORT TERM GOAL #4   Title Pt will improve sit to stand x5 to less than 20 sec   Baseline 55 sec from low mat table no hands   Time 3   Period Weeks   Status On-going           PT Long Term Goals - 05/31/15 1435    PT LONG TERM GOAL #1   Title Pt will be able to demo/understand concepts of good posture and body mechanics as it relates to low back condition.    Time 8   Period Weeks   Status On-going   PT LONG TERM GOAL  #2   Title Pt will be able to report stiffness improved 25% in AM, relieved with brief stretching exercises   Time 8   Period Weeks   Status On-going   PT LONG TERM GOAL #3   Title Pt will be able to stand from chair/recliner at home with only min, occasional difficulty   Time 8   Period Weeks   Status Revised   PT LONG TERM GOAL #4   Title Pt will demo hip strength to 4+/5 or more throughout to aid in walking, transfers and stairs   Time 8   Period Weeks   Status On-going   PT LONG TERM GOAL #5   Baseline DGI 22/24               Plan - 05/31/15 1437    Clinical Impression Statement Emphasis on hip strength and QL stretching. No goals met at this time. DGI completed mild difficulty walking with head movements. Pt reports no known vestibular problems but a history of impaired balance.   PT Next Visit Plan sit to stand, QL stretch, manual to QL/rt side back, estim after if pt would like, closed chain hip strengthening   PT Home Exercise Plan sidelying IT band stretch, standing QL stretch, trunk rotation   Consulted and Agree with Plan of Care Patient        Problem List Patient Active Problem List   Diagnosis Date Noted  . GERD (gastroesophageal reflux disease) 04/06/2014  . Arthritis of knee 10/06/2013  . Obesity (BMI 30-39.9) 02/16/2013  . Left knee pain 09/15/2012  . Osteopenia 09/15/2012  . Pure hypercholesterolemia 06/11/2011  . Hypothyroidism 06/11/2011   Radonna Ricker, SPT  PAA,JENNIFER 05/31/2015, 3:26 PM  [facility] [address] [place], [place], [address] Phone: [phone]   Fax:  [phone]    Raeford Razor, PT 05/31/2015 3:26 PM Phone: [phone] Fax: [phone]

## 2015-06-07 ENCOUNTER — Ambulatory Visit: Payer: Medicare Other | Admitting: Physical Therapy

## 2015-06-07 DIAGNOSIS — M545 Low back pain, unspecified: Secondary | ICD-10-CM

## 2015-06-07 DIAGNOSIS — M2560 Stiffness of unspecified joint, not elsewhere classified: Secondary | ICD-10-CM

## 2015-06-07 DIAGNOSIS — M25551 Pain in right hip: Secondary | ICD-10-CM

## 2015-06-07 DIAGNOSIS — R29898 Other symptoms and signs involving the musculoskeletal system: Secondary | ICD-10-CM

## 2015-06-07 NOTE — Therapy (Signed)
[place], [place], [address] Phone: [phone]   Fax:  [phone]  Physical Therapy Treatment  Patient Details  Name: Kayla Guerrero MRN: [id number] Date of Birth: 12/20/1934 Referring Provider:  Rita Ohara, MD  Encounter Date: 06/07/2015      PT End of Session - 06/07/15 1206    Visit Number 4   Number of Visits 8   Date for PT Re-Evaluation 07/04/15   PT Start Time 1103   PT Stop Time 1200   PT Time Calculation (min) 57 min   Activity Tolerance Patient tolerated treatment well   Behavior During Therapy [facility] for tasks assessed/performed      Past Medical History  Diagnosis Date  . GERD (gastroesophageal reflux disease)   . Obesity   . Borderline osteopenia DEXA 2006 and 2012  . Cholesterol serum elevated   . FHx: colon cancer   . Colon polyps   . Cataract   . Arthritis of knee, left     Past Surgical History  Procedure Laterality Date  . Cholecystectomy    . Tubal ligation    . Colonoscopy  2010, 04/2013    Buccini  . Cataract extraction, bilateral    . Pterygium excision      There were no vitals filed for this visit.  Visit Diagnosis:  Right-sided low back pain without sciatica  Hip pain, right  Joint stiffness of spine  Weakness of right hip      Subjective Assessment - 06/07/15 1105    Subjective No pain today, been doing my exercise daily, walked this AM.     Currently in Pain? No/denies           [facility] Adult PT Treatment/Exercise - 06/07/15 1109    Lumbar Exercises: Supine   Clam 20 reps   Clam Limitations bilateral   Bridge 10 reps   Bridge Limitations with ball   Straight Leg Raise 10 reps   Straight Leg Raises Limitations off the ball, done bilat.    Other Supine Lumbar Exercises heel slide with ball x 10    Other Supine Lumbar Exercises lower trunk rot x 5 each with ball, attempted bridge with HS curl, pain in L knee, did x4    Lumbar Exercises:  Sidelying   Clam 20 reps   Hip Abduction 10 reps   Moist Heat Therapy   Number Minutes Moist Heat 12 Minutes   Moist Heat Location Lumbar Spine;Hip   Manual Therapy   Manual Therapy Soft tissue mobilization;Passive ROM   Soft tissue mobilization sidelying manual massage to QL, 40mn   Passive ROM manual overpressure to QL in sidelying stretch       Pilates Reformer used for LE/core strength, postural strength, lumbopelvic disassociation and core control.  Exercises included: Footwork 2 Red 1 blue on heels x 20, turnout x 20 Cues to maintain neutral pelvis with hip/knee ext.   Single leg 2 Red x 10         PT Education - 06/07/15 1206    Education provided Yes   Education Details Pilates Reformer, QL   Person(s) Educated Patient   Methods Explanation   Comprehension Verbalized understanding          PT Short Term Goals - 06/07/15 1118    PT SHORT TERM GOAL #1   Title Pt will be I with HEP   Status Achieved   PT SHORT TERM GOAL #2   Title Pt will complete balance  screen Merrilee Jansky ) and set goal   Status Achieved   PT SHORT TERM GOAL #3   Title Pt will be able to report less pain in back with transitioning in bed or on mat.    Status Achieved   PT SHORT TERM GOAL #4   Title Pt will improve sit to stand x5 to less than 20 sec           PT Long Term Goals - 06/07/15 1119    PT LONG TERM GOAL #1   Title Pt will be able to demo/understand concepts of good posture and body mechanics as it relates to low back condition.    Status On-going   PT LONG TERM GOAL #2   Title Pt will be able to report stiffness improved 25% in AM, relieved with brief stretching exercises   Status Partially Met   PT LONG TERM GOAL #3   Title Pt will be able to stand from chair/recliner at home with only min, occasional difficulty   Status On-going   PT LONG TERM GOAL #4   Title Pt will demo hip strength to 4+/5 or more throughout to aid in walking, transfers and stairs   Status On-going    PT LONG TERM GOAL #5   Title Berg balance score to be set once completed    ______/56   Status Deferred               Plan - 06/07/15 1207    Clinical Impression Statement Patient feels she may be ready for DC next week.  She has met several goals, feels less pain/stiffness in spine and has good HEP.  Responds well to manual/STW.     PT Next Visit Plan DC vs continue.  Standing QL stretch, try quadruped and review full HEP   PT Home Exercise Plan sidelying IT band stretch, standing QL stretch, trunk rotation   Consulted and Agree with Plan of Care Patient      Will need GCODE if DC  Problem List Patient Active Problem List   Diagnosis Date Noted  . GERD (gastroesophageal reflux disease) 04/06/2014  . Arthritis of knee 10/06/2013  . Obesity (BMI 30-39.9) 02/16/2013  . Left knee pain 09/15/2012  . Osteopenia 09/15/2012  . Pure hypercholesterolemia 06/11/2011  . Hypothyroidism 06/11/2011    Nalea Salce 06/07/2015, 12:10 PM  [facility] [address] [place], [place], [address] Phone: [phone]   Fax:  [phone]   Raeford Razor, PT 06/07/2015 12:10 PM Phone: [phone] Fax: [phone]

## 2015-06-14 ENCOUNTER — Ambulatory Visit: Payer: Medicare Other | Admitting: Physical Therapy

## 2015-06-14 DIAGNOSIS — M25551 Pain in right hip: Secondary | ICD-10-CM

## 2015-06-14 DIAGNOSIS — M545 Low back pain, unspecified: Secondary | ICD-10-CM

## 2015-06-14 DIAGNOSIS — M2560 Stiffness of unspecified joint, not elsewhere classified: Secondary | ICD-10-CM

## 2015-06-14 DIAGNOSIS — R29898 Other symptoms and signs involving the musculoskeletal system: Secondary | ICD-10-CM

## 2015-06-14 NOTE — Therapy (Addendum)
[place], [place], [address] Phone: [phone]   Fax:  [phone]  Physical Therapy Treatment  Patient Details  Name: Kayla Guerrero MRN: [id number] Date of Birth: 03-Mar-1935 Referring Provider:  Rita Ohara, MD  Encounter Date: 06/14/2015      PT End of Session - 06/14/15 1214    Visit Number 5   Number of Visits 8   Date for PT Re-Evaluation 07/04/15   PT Start Time 4268   PT Stop Time 1055   PT Time Calculation (min) 40 min   Activity Tolerance Patient tolerated treatment well   Behavior During Therapy [facility] for tasks assessed/performed      Past Medical History  Diagnosis Date  . GERD (gastroesophageal reflux disease)   . Obesity   . Borderline osteopenia DEXA 2006 and 2012  . Cholesterol serum elevated   . FHx: colon cancer   . Colon polyps   . Cataract   . Arthritis of knee, left     Past Surgical History  Procedure Laterality Date  . Cholecystectomy    . Tubal ligation    . Colonoscopy  2010, 04/2013    Buccini  . Cataract extraction, bilateral    . Pterygium excision      There were no vitals filed for this visit.  Visit Diagnosis:  Right-sided low back pain without sciatica  Hip pain, right  Joint stiffness of spine  Weakness of right hip      Subjective Assessment - 06/14/15 1019    Subjective Pt states she is ready for discharge would like to Review HEP today. She has no pain.   Currently in Pain? No/denies   Pain Score 0-No pain            [facility] PT Assessment - 06/14/15 1032    Strength   Right Hip Flexion 4+/5   Right Hip Extension 4+/5   Right Hip ABduction 5/5   Left Hip Flexion 4+/5   Left Hip Extension 4/5   Left Hip ABduction 5/5   Right Knee Flexion 5/5   Right Knee Extension 5/5   Left Knee Flexion 4+/5   Left Knee Extension 5/5   Right Ankle Dorsiflexion 5/5   Left Ankle Dorsiflexion 5/5   Flexibility   Hamstrings lacking Lt 20degrees            [facility] Adult PT Treatment/Exercise - 06/14/15 1024    Self-Care   Self-Care Other Self-Care Comments   Other Self-Care Comments  reviewed and finalized HEP, discussed body mechanics when performing them and how long to hold stretches   Lumbar Exercises: Stretches   Lower Trunk Rotation 1 rep;30 seconds;Other (comment)   Lower Trunk Rotation Limitations verbal cues for form   Lumbar Exercises: Standing   Other Standing Lumbar Exercises QL sidebend stretch x20 sec   Lumbar Exercises: Supine   Straight Leg Raise 10 reps   Straight Leg Raises Limitations both legs, discussion on where to stop leg in the range   Lumbar Exercises: Sidelying   Hip Abduction 10 reps   Hip Abduction Limitations each leg, verbal and tactile cues for keeping hips stacked   Other Sidelying Lumbar Exercises IT band stretch x30sec   Other Sidelying Lumbar Exercises adduction, 1 set x10   Manual Therapy   Manual Therapy Soft tissue mobilization   Manual therapy comments multiple trigger points along right QL   Soft tissue mobilization sidelying, with manual trigger point release, 67min  PT Education - 06/14/15 1213    Education provided Yes   Education Details Finalized HEP   Person(s) Educated Patient   Methods Explanation;Demonstration;Tactile cues;Verbal cues   Comprehension Verbalized understanding;Returned demonstration          PT Short Term Goals - 06/14/15 1217    PT SHORT TERM GOAL #1   Title Pt will be I with HEP   Status Achieved   PT SHORT TERM GOAL #2   Title Pt will complete balance screen Merrilee Jansky ) and set goal   Status Achieved   PT SHORT TERM GOAL #3   Title Pt will be able to report less pain in back with transitioning in bed or on mat.    Status Achieved   PT SHORT TERM GOAL #4   Title Pt will improve sit to stand x5 to less than 20 sec   Baseline 35sec   Status On-going           PT Long Term Goals - 06/14/15 1215    PT LONG TERM GOAL #1    Title Pt will be able to demo/understand concepts of good posture and body mechanics as it relates to low back condition.    Time 8   Period Weeks   Status Achieved   PT LONG TERM GOAL #2   Title Pt will be able to report stiffness improved 25% in AM, relieved with brief stretching exercises   Time 8   Period Weeks   Status Achieved   PT LONG TERM GOAL #3   Title Pt will be able to stand from chair/recliner at home with only min, occasional difficulty   Baseline " istill have to use my hands or lean forward far but i try real hard not too"   Time 8   Period Weeks   Status Partially Met   PT LONG TERM GOAL #4   Title Pt will demo hip strength to 4+/5 or more throughout to aid in walking, transfers and stairs   Time 8   Period Weeks   Status Achieved               Plan - 06/14/15 1217    Clinical Impression Statement Pt requested d/c today. She has had no pain. Reviewed and Finalized HEP, minimal tactile and verbal cues required to correct body alignment. Pt has met STG #1-3 and improved on STG 4 and LTG's #1,2 and 4 have been met and 3 was partially met.    PT Next Visit Plan discharged   Consulted and Agree with Plan of Care Patient        Problem List Patient Active Problem List   Diagnosis Date Noted  . GERD (gastroesophageal reflux disease) 04/06/2014  . Arthritis of knee 10/06/2013  . Obesity (BMI 30-39.9) 02/16/2013  . Left knee pain 09/15/2012  . Osteopenia 09/15/2012  . Pure hypercholesterolemia 06/11/2011  . Hypothyroidism 06/11/2011   PHYSICAL THERAPY DISCHARGE SUMMARY  Visits from Start of Care: 5  Current functional level related to goals / functional outcomes: Pt has returned to PLOF with minimal difficulty rising from recliner at home with use of hands. No longer reports pain.Improved hamstring length bilaterally.    Remaining deficits: Difficulty rising out of recliner without use of hands   Education / Equipment: Finalized HEP, body  mechanics Plan: Patient agrees to discharge.  Patient goals were met. Patient is being discharged due to being pleased with the current functional level.  ?????  Radonna Ricker, SPT  Radonna Ricker 06/14/2015, 12:20 PM  [facility] [address] [place], [place], [address] Phone: [phone]   Fax:  [phone]   Raeford Razor, PT 06/14/2015 2:32 PM Phone: [phone] Fax: [phone]

## 2015-07-10 ENCOUNTER — Other Ambulatory Visit (INDEPENDENT_AMBULATORY_CARE_PROVIDER_SITE_OTHER): Payer: Medicare Other

## 2015-07-10 DIAGNOSIS — Z23 Encounter for immunization: Secondary | ICD-10-CM

## 2015-08-07 ENCOUNTER — Telehealth: Payer: Self-pay

## 2015-08-07 NOTE — Telephone Encounter (Signed)
Refill request for Levothyroxine 9mcg #90

## 2015-08-07 NOTE — Telephone Encounter (Signed)
She was prescribed #90 with 3 refills in January (that is a year supply)--refills shouldn't be needed until January, and pt has visit scheduled then.  Will refill sooner if going to run out prior to appt, shouldn't be needed now.  Amber, FYI--please send all non-urgent, non-controlled, routine refill requests to the nurse, as they are the ones that handle them (and they forward the ones to me that they aren't sure about)

## 2015-08-08 NOTE — Telephone Encounter (Signed)
Spoke with patient and she states that this was a mistake.

## 2015-09-18 ENCOUNTER — Other Ambulatory Visit: Payer: Self-pay | Admitting: Family Medicine

## 2015-10-16 ENCOUNTER — Other Ambulatory Visit: Payer: Medicare Other

## 2015-10-16 DIAGNOSIS — E7800 Pure hypercholesterolemia, unspecified: Secondary | ICD-10-CM

## 2015-10-16 DIAGNOSIS — Z5181 Encounter for therapeutic drug level monitoring: Secondary | ICD-10-CM

## 2015-10-16 DIAGNOSIS — E039 Hypothyroidism, unspecified: Secondary | ICD-10-CM

## 2015-10-16 LAB — COMPREHENSIVE METABOLIC PANEL
ALT: 11 U/L (ref 6–29)
AST: 20 U/L (ref 10–35)
Albumin: 3.7 g/dL (ref 3.6–5.1)
Alkaline Phosphatase: 49 U/L (ref 33–130)
BUN: 14 mg/dL (ref 7–25)
CHLORIDE: 102 mmol/L (ref 98–110)
CO2: 28 mmol/L (ref 20–31)
Calcium: 9 mg/dL (ref 8.6–10.4)
Creat: 0.87 mg/dL (ref 0.60–0.93)
GLUCOSE: 83 mg/dL (ref 65–99)
POTASSIUM: 4.2 mmol/L (ref 3.5–5.3)
SODIUM: 140 mmol/L (ref 135–146)
Total Bilirubin: 0.5 mg/dL (ref 0.2–1.2)
Total Protein: 6.5 g/dL (ref 6.1–8.1)

## 2015-10-16 LAB — CBC WITH DIFFERENTIAL/PLATELET
BASOS ABS: 0.1 10*3/uL (ref 0.0–0.1)
BASOS PCT: 1 % (ref 0–1)
EOS ABS: 0.2 10*3/uL (ref 0.0–0.7)
EOS PCT: 4 % (ref 0–5)
HCT: 42 % (ref 36.0–46.0)
Hemoglobin: 14.1 g/dL (ref 12.0–15.0)
LYMPHS PCT: 41 % (ref 12–46)
Lymphs Abs: 2.5 10*3/uL (ref 0.7–4.0)
MCH: 30 pg (ref 26.0–34.0)
MCHC: 33.6 g/dL (ref 30.0–36.0)
MCV: 89.4 fL (ref 78.0–100.0)
MPV: 10.4 fL (ref 8.6–12.4)
Monocytes Absolute: 0.5 10*3/uL (ref 0.1–1.0)
Monocytes Relative: 8 % (ref 3–12)
NEUTROS ABS: 2.8 10*3/uL (ref 1.7–7.7)
Neutrophils Relative %: 46 % (ref 43–77)
PLATELETS: 267 10*3/uL (ref 150–400)
RBC: 4.7 MIL/uL (ref 3.87–5.11)
RDW: 14.8 % (ref 11.5–15.5)
WBC: 6.1 10*3/uL (ref 4.0–10.5)

## 2015-10-16 LAB — LIPID PANEL
CHOL/HDL RATIO: 3 ratio (ref <=5.0)
Cholesterol: 238 mg/dL — ABNORMAL HIGH (ref 125–200)
HDL: 80 mg/dL (ref >=46)
LDL CALC: 144 mg/dL — AB (ref <130)
Triglycerides: 71 mg/dL (ref <150)
VLDL: 14 mg/dL (ref <30)

## 2015-10-17 ENCOUNTER — Other Ambulatory Visit: Payer: Medicare Other

## 2015-10-17 LAB — TSH: TSH: 2.903 u[IU]/mL (ref 0.350–4.500)

## 2015-10-18 ENCOUNTER — Encounter: Payer: Self-pay | Admitting: Family Medicine

## 2015-10-18 ENCOUNTER — Ambulatory Visit (INDEPENDENT_AMBULATORY_CARE_PROVIDER_SITE_OTHER): Payer: Medicare Other | Admitting: Family Medicine

## 2015-10-18 VITALS — BP 130/76 | HR 84 | Ht 66.25 in | Wt 175.6 lb

## 2015-10-18 DIAGNOSIS — Z0000 Encounter for general adult medical examination without abnormal findings: Secondary | ICD-10-CM | POA: Diagnosis not present

## 2015-10-18 DIAGNOSIS — E7800 Pure hypercholesterolemia, unspecified: Secondary | ICD-10-CM

## 2015-10-18 DIAGNOSIS — E039 Hypothyroidism, unspecified: Secondary | ICD-10-CM | POA: Diagnosis not present

## 2015-10-18 DIAGNOSIS — E669 Obesity, unspecified: Secondary | ICD-10-CM

## 2015-10-18 LAB — POCT URINALYSIS DIPSTICK
GLUCOSE UA: NEGATIVE
KETONES UA: NEGATIVE
Leukocytes, UA: NEGATIVE
Nitrite, UA: NEGATIVE
Protein, UA: NEGATIVE
RBC UA: NEGATIVE
Spec Grav, UA: 1.02
UROBILINOGEN UA: NEGATIVE
pH, UA: 6

## 2015-10-18 NOTE — Patient Instructions (Signed)
  HEALTH MAINTENANCE RECOMMENDATIONS:  It is recommended that you get at least 30 minutes of aerobic exercise at least 5 days/week (for weight loss, you may need as much as 60-90 minutes). This can be any activity that gets your heart rate up. This can be divided in 10-15 minute intervals if needed, but try and build up your endurance at least once a week.  Weight bearing exercise is also recommended twice weekly.  Eat a healthy diet with lots of vegetables, fruits and fiber.  "Colorful" foods have a lot of vitamins (ie green vegetables, tomatoes, red peppers, etc).  Limit sweet tea, regular sodas and alcoholic beverages, all of which has a lot of calories and sugar.  Up to 1 alcoholic drink daily may be beneficial for women (unless trying to lose weight, watch sugars).  Drink a lot of water.  Calcium recommendations are 1200-1500 mg daily (1500 mg for postmenopausal women or women without ovaries), and vitamin D 1000 IU daily.  This should be obtained from diet and/or supplements (vitamins), and calcium should not be taken all at once, but in divided doses.  Monthly self breast exams and yearly mammograms for women over the age of 57 is recommended.  Sunscreen of at least SPF 30 should be used on all sun-exposed parts of the skin when outside between the hours of 10 am and 4 pm (not just when at beach or pool, but even with exercise, golf, tennis, and yard work!)  Use a sunscreen that says "broad spectrum" so it covers both UVA and UVB rays, and make sure to reapply every 1-2 hours.  Remember to change the batteries in your smoke detectors when changing your clock times in the spring and fall.  Use your seat belt every time you are in a car, and please drive safely and not be distracted with cell phones and texting while driving.    Kayla Guerrero , Thank you for taking time to come for your Medicare Wellness Visit. I appreciate your ongoing commitment to your health goals. Please review the  following plan we discussed and let me know if I can assist you in the future.   These are the goals we discussed: Goals    None      This is a list of the screening recommended for you and due dates:  Health Maintenance  Topic Date Due  . Flu Shot  04/29/2016  . Tetanus Vaccine  12/11/2020  . DEXA scan (bone density measurement)  Completed  . Shingles Vaccine  Completed  . Pneumonia vaccines  Completed   Your next mammogram is due in August 2017. Colonoscopy would be due probably 5 years from the last (2019) due to your mother's history of colon cancer. I recommend in 2019 doing Cologard instead, fecal DNA test, and if normal, repeat this test every 3 years. If abnormal, then you will need the colonoscopy.

## 2015-10-18 NOTE — Progress Notes (Signed)
Chief Complaint  Patient presents with  . Medicare Wellness    CPE/AWV, nonfasting with pelvic exam. Did not do eye exam as she has one scheduled next week. Has a clogged oil gland on left lower eyelid per her eye doctor.    Kayla Guerrero is a 81 y.o. female who presents for annual wellness visit and follow-up on chronic medical conditions.  She has the following concerns:  When she was seen in July, she was reporting have some trouble getting out of chairs, and balance problems.  She had physical therapy through August and September. She feels better, and continues to do almost daily home exercises.  Only has a slight balance issue when she wakes up in the middle of the night--just takes it slow and she is fine.  No falls.  GERD: Resolved with dietary changes and weight loss.  Hyperlipidemia: She has been taking red yeast rice 3/day (1 in the morning, 2 at dinner) without any side effects. She admits to still having cheese, but not as much.  Eating a low calorie fudgcicle, not ice cream; continues to limits eggs, red meat just 2x/week.  Hypothyroid--denies fatigue, changes in hair/skin/bowels/moods.Has some constipation, that is usually pretty controlled with daily fiber, stable and unchanged. +intentional weight loss   Left knee pain--She had another series of 3 injections into the left knee by Dr. Wynelle Link in July.  It is holding well so far.   Immunization History  Administered Date(s) Administered  . H1N1 06/21/2009  . Influenza Split 06/19/2011, 06/22/2012  . Influenza Whole 07/12/2010  . Influenza, High Dose Seasonal PF 07/06/2013, 07/21/2014, 07/10/2015  . Pneumococcal Conjugate-13 10/12/2014  . Pneumococcal Polysaccharide-23 08/20/2010  . Tdap 12/12/2010  . Zoster 09/30/2011   Last Pap smear: 09/2013 Last mammogram: 04/2014 Last colonoscopy: 04/2013, Dr. Cristina Gong Last DEXA: 03/2014 Normal Dentist: twice yearly Ophtho: yearly (Dr. Herbert Deaner) Exercise: Water exercises 3x/week,  walks 30 minutes daily. Getting 6K-10K steps daily. Sees dermatologist for routine skin checks   Other doctors caring for patient include: Ophtho: Dr. Herbert Deaner (Dr. Kathlen Mody at Dr. Payton [facility] office) Dentist: previously Dr. Enrique Sack to female, can't recall name Endodontist: Dr. Satira Sark Ortho: Dr. Wynelle Link Derm: Dr. Bonney Roussel: Dr. Cristina Gong  Depression screen: Negative Fall screen: negative Functional Status screen: notable only for very slight hearing loss, and slight memory issues, unchanged from last year.  She has some occasional urge incontinence See epic questionnaire for full screen.  End of Life Discussion:  Patient has a living will and medical power of attorney  Past Medical History  Diagnosis Date  . GERD (gastroesophageal reflux disease)   . Obesity   . Borderline osteopenia DEXA 2006 and 2012  . Cholesterol serum elevated   . FHx: colon cancer   . Colon polyps   . Cataract   . Arthritis of knee, left     Past Surgical History  Procedure Laterality Date  . Cholecystectomy    . Tubal ligation    . Colonoscopy  2010, 04/2013    Buccini  . Cataract extraction, bilateral    . Pterygium excision      Social History   Social History  . Marital Status: Married    Spouse Name: N/A  . Number of Children: 1  . Years of Education: N/A   Occupational History  . retired Librarian, academic); Artist    Social History Main Topics  . Smoking status: Former Smoker    Quit date: 09/29/1980  . Smokeless tobacco: Never Used  . Alcohol Use:  0.0 oz/week    0 Standard drinks or equivalent per week     Comment: 1 glass of wine 3-4 times/week; hard liquor (highball) or a beer once a week  . Drug Use: No  . Sexual Activity: Not on file   Other Topics Concern  . Not on file   Social History Narrative   Lives with husband.  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 Son  lives in [place]; granddaughter lives in [place] (with her mother--son is divorced)    Family History  Problem Relation Age of Onset  . Cancer Mother 59    COLON   . Heart disease Mother   . Mental illness Maternal Uncle   . Cancer Maternal Grandmother     stomach  . Macular degeneration Maternal Grandmother   . Diabetes Neg Hx   . Cancer Maternal Aunt   . Breast cancer Maternal Aunt     Outpatient Encounter Prescriptions as of 10/18/2015  Medication Sig Note  . calcium carbonate (OS-CAL) 600 MG TABS Take 600 mg by mouth daily.     . Cholecalciferol (VITAMIN D) 2000 UNITS tablet Take 2,000 Units by mouth daily.   . fish oil-omega-3 fatty acids 1000 MG capsule Take 1 g by mouth daily.    [name] glucosamine-chondroitin 500-400 MG tablet Take 1 tablet by mouth 2 (two) times daily.    [name] levothyroxine (SYNTHROID, LEVOTHROID) 88 MCG tablet TAKE ONE TABLET BY MOUTH ONCE DAILY BEFORE BREAKFAST   . Magnesium 400 MG CAPS Take 1 capsule by mouth daily.     . Multiple Vitamin (MULTI-VITAMIN) tablet Take 1 tablet by mouth daily.     [name] neomycin-polymyxin b-dexamethasone (MAXITROL) 3.5-10000-0.1 OINT INSTILL ONE APPLICATION INTO THE LEFT EYE TID 10/18/2015: Received from: External Pharmacy  . Red Yeast Rice Extract (RED YEAST RICE PO) Take 1 tablet by mouth 3 (three) times daily.  04/19/2015: Takes 1 in the morning, 2 with dinner  . calcium carbonate (TUMS - DOSED IN MG ELEMENTAL CALCIUM) 500 MG chewable tablet Chew 1-2 tablets by mouth as needed for indigestion or heartburn. Reported on 10/18/2015 10/18/2015: Hasn't needed  . naproxen sodium (ANAPROX) 220 MG tablet Take 220 mg by mouth as needed. Reported on 10/18/2015 04/06/2014: Uses only as needed (usually just with travel), not regularly   Facility-Administered Encounter Medications as of 10/18/2015  Medication  . influenza  inactive virus vaccine (FLUZONE/FLUARIX) injection 0.5 mL    Allergies  Allergen Reactions  . Levaquin [Levofloxacin  Hemihydrate] Other (See Comments)  . Promethazine Hcl Other (See Comments)  . Sulfa Drugs Cross Reactors Swelling    ROS: The patient denies anorexia, fever, headaches, vision changes, ear pain, sore throat, breast concerns, chest pain, palpitations, syncope, dyspnea on exertion, cough, swelling, nausea, vomiting, diarrhea, abdominal pain, melena, hematochezia, hematuria, incontinence (rare urge incontinence, slight), dysuria, vaginal bleeding, discharge, odor or itch, genital lesions, numbness, tingling, weakness, tremor, suspicious skin lesions, depression, abnormal bleeding/bruising, or enlarged lymph nodes. Intermittent trouble sleeping, not often or bothersome. +decreased libido (stable, unchanged);  L knee pain--s/p injections and pain has improved.Has some mild discomfort over anterior tibialis muscle, but doesn't limit her activities. Some left shoulder pain, mainly when she sleeps on her left side. +memory troubles (unimportant things, nothing significant, unchanged--names)  Constipation--controlled with diet, fiber supplement. Vertigo if she gets up too quickly from bed--takes it slow and hasn't had any consequences/falls. Slight URI last  week, resolving. +weight loss (38# in the last year)   PHYSICAL EXAM:  BP 130/76 mmHg  Pulse 84  Ht 5' 6.25" (1.683 m)  Wt 175 lb 9.6 oz (79.652 kg)  BMI 28.12 kg/m2  General Appearance:  Alert, cooperative, no distress, appears stated age   Head:  Normocephalic, without obvious abnormality, atraumatic   Eyes:  PERRL, conjunctiva/corneas clear, EOM's intact, fundi  benign   Ears:  Normal TM's and external ear canals.  Nose:  Nares normal, mucosa normal, no drainage or sinus tenderness   Throat:  Lips, mucosa, and tongue normal; teeth and gums normal   Neck:  Supple, no lymphadenopathy; thyroid: no enlargement/tenderness/nodules; no carotid  bruit or JVD   Back:  Spine nontender, no curvature, ROM normal, no  CVA tenderness   Lungs:  Clear to auscultation bilaterally without wheezes, rales or ronchi; respirations unlabored   Chest Wall:  No tenderness or deformity   Heart:  Regular rate and rhythm, S1 and S2 normal, no murmur, rub  or gallop   Breast Exam:  No tenderness, masses, or nipple discharge or inversion. No axillary lymphadenopathy   Abdomen:  Soft, non-tender, nondistended, normoactive bowel sounds,  no masses, no hepatosplenomegaly   Genitalia:  Normal external genitalia without lesions. BUS and vagina normal; no cervical motion tenderness. No abnormal vaginal discharge. +atrophic changes. Uterus and adnexa not enlarged, nontender, no masses. Pap not performed   Rectal:  Normal tone, no masses or tenderness; guaiac negative stool; +external hemorrhoids   Extremities:  No clubbing, cyanosis or edema. See below for knee exam   Pulses:  2+ and symmetric all extremities--somewhat diminished DP on the right, but 2+ PT bilaterally.  Skin:  Skin color, texture, turgor normal, no rashes or lesions. many angiomas, and other benign skin changes.  Lymph nodes:  Cervical, supraclavicular, and axillary nodes normal   Neurologic:  CNII-XII intact, normal strength, sensation and gait; reflexes 2+ and symmetric throughout    Psych:  Normal mood, affect, hygiene and grooming       Urine dip: normal.  Lab Results  Component Value Date   WBC 6.1 10/16/2015   HGB 14.1 10/16/2015   HCT 42.0 10/16/2015   MCV 89.4 10/16/2015   PLT 267 10/16/2015     Chemistry      Component Value Date/Time   NA 140 10/16/2015 1516   K 4.2 10/16/2015 1516   CL 102 10/16/2015 1516   CO2 28 10/16/2015 1516   BUN 14 10/16/2015 1516   CREATININE 0.87 10/16/2015 1516      Component Value Date/Time   CALCIUM 9.0 10/16/2015 1516   ALKPHOS 49 10/16/2015 1516   AST 20 10/16/2015 1516   ALT 11 10/16/2015 1516   BILITOT 0.5 10/16/2015 1516      Fasting glucose 83  Lab Results  Component Value Date   TSH 2.903 10/16/2015   Lab Results  Component Value Date   CHOL 238* 10/16/2015   HDL 80 10/16/2015   LDLCALC 144* 10/16/2015   TRIG 71 10/16/2015   CHOLHDL 3.0 10/16/2015    ASSESSMENT/PLAN:  Annual physical exam - Plan: POCT Urinalysis Dipstick  Obesity (BMI 30-39.9) - congratulated on weight loss, encouraged to continue healthy eating, exercise, weight loss  Hypothyroidism, unspecified hypothyroidism type - adequately replaced with current dose  Pure hypercholesterolemia - excellent HDL, borderline high LDL. Continue red yeast rice, and lowfat, low cholesterol diet   Discussed monthly self breast exams and yearly mammograms; at least 30 minutes of  aerobic activity at least 5 days/week, weight-bearing exercise 2x/wk; proper sunscreen use reviewed; healthy diet, including goals of calcium and vitamin D intake and alcohol recommendations (less than or equal to 1 drink/day) reviewed; regular seatbelt use; changing batteries in smoke detectors. Immunization recommendations discussed--UTD. Colonoscopy recommendations reviewed--UTD. Discussed doing cologard in 2019 instead of colonoscopy (and doing colonoscopy only if abnormal Cologard).  +FH colon cancer. Pap smear UTD, due again next year.  End of Life discussion--has power of attorney and living will. She desires to be Full Code, Full Care.  F/u 6 months to a year (can be 1 year if doing well without problems). If comes 6 mos--LFT's and lipids, TSH if further significant weight loss or symptoms    Medicare Attestation I have personally reviewed: The patient's medical and social history Their use of alcohol, tobacco or illicit drugs Their current medications and supplements The patient's functional ability including ADLs,fall risks, home safety risks, cognitive, and hearing and visual impairment Diet and physical activities Evidence for depression or mood  disorders  The patient's weight, height, BMI, and visual acuity have been recorded in the chart.  I have made referrals, counseling, and provided education to the patient based on review of the above and I have provided the patient with a written personalized care plan for preventive services.     Leathia Farnell A, MD   10/18/2015

## 2015-10-30 ENCOUNTER — Ambulatory Visit (INDEPENDENT_AMBULATORY_CARE_PROVIDER_SITE_OTHER): Payer: Medicare Other | Admitting: Podiatry

## 2015-10-30 ENCOUNTER — Encounter: Payer: Self-pay | Admitting: Podiatry

## 2015-10-30 ENCOUNTER — Ambulatory Visit (INDEPENDENT_AMBULATORY_CARE_PROVIDER_SITE_OTHER): Payer: Medicare Other

## 2015-10-30 VITALS — BP 104/68 | HR 81 | Resp 16 | Ht 66.25 in | Wt 171.0 lb

## 2015-10-30 DIAGNOSIS — M79671 Pain in right foot: Secondary | ICD-10-CM

## 2015-10-30 DIAGNOSIS — L603 Nail dystrophy: Secondary | ICD-10-CM | POA: Diagnosis not present

## 2015-10-30 DIAGNOSIS — M722 Plantar fascial fibromatosis: Secondary | ICD-10-CM

## 2015-10-30 NOTE — Progress Notes (Signed)
   Subjective:    Patient ID: Kayla Guerrero, female    DOB: 1935-06-09, 81 y.o.   MRN: [id number]  HPI : she presents today with a 2 day duration of pain to the plantar lateral aspect of the right foot. She states that it was hurting so badly that I can hardly put my foot to the floor. She denies trauma. She is also concerned about the discoloration of her toenails.    Review of Systems  All other systems reviewed and are negative.      Objective:   Physical Exam : 81 year old female vital signs stable alert and oriented 3. Pulses are strongly palpable. Neurologic sensorium is intact per Semmes-Weinstein monofilament. Deep tendon reflexes are intact. Muscle strength +5 over 5 dorsiflexion plantar flexors and inverters everters alters musculatures intact. Orthopedic evaluation was resulted joints distal to the ankle for range of motion without crepitation. There is pain on palpation just to the medial aspect of the fifth metatarsal base deep in the tissues. Radiographs do not demonstrate any type a fracture. More than likely just an inflammatory event. Her toenails are thick yellow dystrophic onychomycotic.        Assessment & Plan:   samples were taken of the nail today to be sent for pathologic evaluation. I injected the area of pain today with Kenalog and local anesthetic will follow up with her in 3 weeks.

## 2015-11-23 ENCOUNTER — Telehealth: Payer: Self-pay | Admitting: *Deleted

## 2015-11-23 NOTE — Telephone Encounter (Signed)
Dr. Milinda Pointer reviewed 10/30/2015 fungal culture results as positive, and ordered pt to come in for an appt to discuss treatment.  I left message with orders on pt's cellphone.

## 2015-11-27 ENCOUNTER — Telehealth: Payer: Self-pay | Admitting: *Deleted

## 2015-11-27 NOTE — Telephone Encounter (Signed)
Pt called for fungal culture results.  Left message informing pt of the 1st call concerning results on 11/23/2015 left on mobile phone, and instructed pt to make an appt to discuss results and treatment.

## 2015-12-13 ENCOUNTER — Encounter: Payer: Self-pay | Admitting: Podiatry

## 2015-12-13 ENCOUNTER — Ambulatory Visit (INDEPENDENT_AMBULATORY_CARE_PROVIDER_SITE_OTHER): Payer: Medicare Other | Admitting: Podiatry

## 2015-12-13 DIAGNOSIS — L603 Nail dystrophy: Secondary | ICD-10-CM | POA: Diagnosis not present

## 2015-12-13 DIAGNOSIS — Z79899 Other long term (current) drug therapy: Secondary | ICD-10-CM | POA: Diagnosis not present

## 2015-12-13 LAB — CBC WITH DIFFERENTIAL/PLATELET
BASOS PCT: 1 % (ref 0–1)
Basophils Absolute: 0.1 10*3/uL (ref 0.0–0.1)
EOS PCT: 3 % (ref 0–5)
Eosinophils Absolute: 0.2 10*3/uL (ref 0.0–0.7)
HCT: 43.2 % (ref 36.0–46.0)
Hemoglobin: 14.1 g/dL (ref 12.0–15.0)
Lymphocytes Relative: 40 % (ref 12–46)
Lymphs Abs: 2.2 10*3/uL (ref 0.7–4.0)
MCH: 29 pg (ref 26.0–34.0)
MCHC: 32.6 g/dL (ref 30.0–36.0)
MCV: 88.7 fL (ref 78.0–100.0)
MONO ABS: 0.4 10*3/uL (ref 0.1–1.0)
MONOS PCT: 7 % (ref 3–12)
MPV: 11.2 fL (ref 8.6–12.4)
Neutro Abs: 2.7 10*3/uL (ref 1.7–7.7)
Neutrophils Relative %: 49 % (ref 43–77)
Platelets: 302 10*3/uL (ref 150–400)
RBC: 4.87 MIL/uL (ref 3.87–5.11)
RDW: 14 % (ref 11.5–15.5)
WBC: 5.6 10*3/uL (ref 4.0–10.5)

## 2015-12-13 LAB — HEPATIC FUNCTION PANEL
ALT: 8 U/L (ref 6–29)
AST: 19 U/L (ref 10–35)
Albumin: 3.9 g/dL (ref 3.6–5.1)
Alkaline Phosphatase: 48 U/L (ref 33–130)
BILIRUBIN DIRECT: 0.1 mg/dL (ref <=0.2)
BILIRUBIN TOTAL: 0.4 mg/dL (ref 0.2–1.2)
Indirect Bilirubin: 0.3 mg/dL (ref 0.2–1.2)
Total Protein: 6.7 g/dL (ref 6.1–8.1)

## 2015-12-13 MED ORDER — TERBINAFINE HCL 250 MG PO TABS: 250.0000 mg | ORAL_TABLET | Freq: Every day | ORAL | Status: DC

## 2015-12-13 NOTE — Progress Notes (Signed)
She presents today for follow-up of her plantar fasciitis which she states is doing great she cannot even remember which foot was hurting and for her biopsy report.  Objective: Vital signs are stable she is alert and oriented 3. Biopsy report doesn't do straight onychomycosis.  Assessment: Well-healing plantar fasciitis right onychomycosis.  Plan: After thorough discussion we started her on oral antifungal. Lamisil 250 mg #30 with a liver profile and I will follow up with her in 1 month.

## 2015-12-13 NOTE — Patient Instructions (Signed)

## 2015-12-17 ENCOUNTER — Telehealth: Payer: Self-pay | Admitting: *Deleted

## 2015-12-17 NOTE — Telephone Encounter (Signed)
Pt states she will run out of the Terbinafine 4 days prior to the appt with Dr. Milinda Pointer.  I left message informing pt that would be fine the medication actually stayed in her body for at least 2 weeks per the drug rep.

## 2015-12-18 ENCOUNTER — Telehealth: Payer: Self-pay | Admitting: *Deleted

## 2015-12-18 NOTE — Telephone Encounter (Addendum)
-----   Message from Garrel Ridgel, [place] sent at 12/17/2015  7:41 AM EDT ----- Blood work looks good and may continue medication.  12/18/2015-Left message with Dr. Stephenie Acres orders.

## 2015-12-18 NOTE — Telephone Encounter (Deleted)
-----   Message from Garrel Ridgel, [place] sent at 12/17/2015  7:41 AM EDT ----- Blood work looks good and may continue medication.

## 2015-12-31 ENCOUNTER — Encounter: Payer: Self-pay | Admitting: Family Medicine

## 2015-12-31 ENCOUNTER — Ambulatory Visit (INDEPENDENT_AMBULATORY_CARE_PROVIDER_SITE_OTHER): Payer: Medicare Other | Admitting: Family Medicine

## 2015-12-31 VITALS — BP 118/68 | HR 92 | Temp 100.3°F | Ht 66.25 in | Wt 170.6 lb

## 2015-12-31 DIAGNOSIS — J209 Acute bronchitis, unspecified: Secondary | ICD-10-CM | POA: Diagnosis not present

## 2015-12-31 DIAGNOSIS — R509 Fever, unspecified: Secondary | ICD-10-CM

## 2015-12-31 DIAGNOSIS — R52 Pain, unspecified: Secondary | ICD-10-CM

## 2015-12-31 LAB — POC INFLUENZA A&B (BINAX/QUICKVUE)
INFLUENZA A, POC: NEGATIVE
Influenza B, POC: NEGATIVE

## 2015-12-31 MED ORDER — AZITHROMYCIN 250 MG PO TABS: ORAL_TABLET | ORAL | Status: DC

## 2015-12-31 NOTE — Progress Notes (Signed)
Chief Complaint  Patient presents with  . Cough    started around 3/24 with cough and feeling bad. [place] and hit on her head on furniture 3/27. Rest of the week felt bad, felt somewhat better buy 3/31, went out to dinner Sat 4/1 didn't feel great but went anyway. Next day Sunday 4/2 started vomiting and dry heaves, her stomach is sore now from this. She is very gassy. Spent all day and night yesterday in bed. Feels very weak today. Still coughing and has discolored mucus - greenish in color. No fevers.   . Medication    only took thyroid medication today. Was taking mucinex DM BID but did stop yesterday.   She has had some ongoing illness periodically throughout the winter.  She had been well for a couple of weeks, then had recurrent cough on 3/24, only slight runny nose.  Nose is clear, but phlegm is discolored. Denies sinus pain.  She had a headache all day yesterday, whole top of her head.  Denies sinus pain. 3/27 after going to the bathroom, she was walking out to breakfast, got lightheaded, and passed out (still in the bedroom).  She hit the left side of her head. Husband was home and heard her fall and went to assist.  She was out for about a minute, and mentation was normal when she came to. She felt okay the rest of the day and week--she was able to go out for walks.  Her head soreness had been improving.  Yesterday she started with vomiting, belchy, gassy.  No diarrhea.  No stool yesterday at all. Felt much more fatigued, stayed in bed and drank water.  Denies dizziness today, just feels weak.  No sick contacts. Son got sick after being around her, but already improved.  PMH, PSH, SH reviewed  Outpatient Encounter Prescriptions as of 12/31/2015  Medication Sig Note  . calcium carbonate (OS-CAL) 600 MG TABS Take 600 mg by mouth daily.     . calcium carbonate (TUMS - DOSED IN MG ELEMENTAL CALCIUM) 500 MG chewable tablet Chew 1-2 tablets by mouth as needed for indigestion or heartburn. Reported  on 10/18/2015 10/18/2015: Hasn't needed  . Cholecalciferol (VITAMIN D) 2000 UNITS tablet Take 2,000 Units by mouth daily.   . fish oil-omega-3 fatty acids 1000 MG capsule Take 1 g by mouth daily.    [name] glucosamine-chondroitin 500-400 MG tablet Take 1 tablet by mouth 2 (two) times daily.    [name] levothyroxine (SYNTHROID, LEVOTHROID) 88 MCG tablet TAKE ONE TABLET BY MOUTH ONCE DAILY BEFORE BREAKFAST   . Magnesium 400 MG CAPS Take 1 capsule by mouth daily.     . Multiple Vitamin (MULTI-VITAMIN) tablet Take 1 tablet by mouth daily.     . Red Yeast Rice Extract (RED YEAST RICE PO) Take 1 tablet by mouth 3 (three) times daily.  04/19/2015: Takes 1 in the morning, 2 with dinner  . terbinafine (LAMISIL) 250 MG tablet Take 1 tablet (250 mg total) by mouth daily.   . naproxen sodium (ANAPROX) 220 MG tablet Take 220 mg by mouth as needed. Reported on 12/31/2015 04/06/2014: Uses only as needed (usually just with travel), not regularly  . [DISCONTINUED] neomycin-polymyxin b-dexamethasone (MAXITROL) 3.5-10000-0.1 OINT INSTILL ONE APPLICATION INTO THE LEFT EYE TID 10/18/2015: Received from: External Pharmacy   Facility-Administered Encounter Medications as of 12/31/2015  Medication  . influenza  inactive virus vaccine (FLUZONE/FLUARIX) injection 0.5 mL   Only has taken her thyroid medication today and yesterday, no other medications  She took mucinex for several days, last taken yesterday morning.  Allergies  Allergen Reactions  . Levaquin [Levofloxacin Hemihydrate] Other (See Comments)  . Promethazine Hcl Other (See Comments)    fainting  . Sulfa Drugs Cross Reactors Swelling   ROS: No sore throat, ear pain. Denies shortness of breath, chest pain. No bleeding, bruising, rashes.  No urinary complaints. Yesterday she had more body aches. Chronic constipation.  See HPI  PHYSICAL EXAM: BP 118/68 mmHg  Pulse 92  Temp(Src) 100.3 F (37.9 C) (Tympanic)  Ht 5' 6.25" (1.683 m)  Wt 170 lb 9.6 oz (77.384 kg)  BMI 27.32  kg/m2  SpO2 97%  Tired appearing, pleasant female, in no distress.  Occasional wet-sounding cough during visit. Speaking easily, in no distress HEENT: PERRL, EOMi, conjunctiva and sclera are clear.  TM's and EAc's normal. Nasal mucosa is mildly edematous, no erythema or purulence. Focal area of soft tissue swelling that is mildly tender at left temperoparietal scalp. Sinuses are nontender. OP is clear, moist mucus membranes, no erythema. Fundi benign (not very well visualized on the left) Neck: no lymphadenopathy, thyromegaly or mass Heart: regular rate and rhythm without murmur Lungs: clear bilaterally Abdomen: normal bowel sounds.  Very mild diffuse soreness (seems muscular from vomiting, per pt), no mass, nondistended, no organomegaly or mass Extremities: no edema, 2+ pulses Skin: normal turgor, slightly pale. Neuro: alert and oriented. Cranial nerves intact. Normal strength, gait.  ASSESSMENT/PLAN:  Fever, unspecified - Plan: POC Influenza A&B (Binax test)  Body aches - Plan: POC Influenza A&B (Binax test)  Acute bronchitis, unspecified organism - Plan: azithromycin (ZITHROMAX) 250 MG tablet   Drink plenty of fluids, get plenty of rest. Use tylenol as needed for pain and/or fever. Only use ibuprofen (for pain/fever) if you can take it with food. Given the length of of time that you have had ongoing discolored phlegm, and the low grade fever and worsening fatigue, we will treat you with antibiotics for bronchitis.  I do not hear evidence of pneumonia on your exam. Return next week if you have persistent discolored phlegm, fever, not seeing any improvement. You should be at least 50% better by the time you take the last pill, but remember that it stays in your system for another 5 days.    Go to ER for recurrent severe headache, persistent vomiting, mental status (confusion, lethargy), or other concerns.  Otherwise, follow up next week if not better, sooner if worse.   Declines  nausea meds.

## 2015-12-31 NOTE — Patient Instructions (Signed)
Drink plenty of fluids, get plenty of rest. Use tylenol as needed for pain and/or fever. Only use ibuprofen (for pain/fever) if you can take it with food. Given the length of of time that you have had ongoing discolored phlegm, and the low grade fever and worsening fatigue, we will treat you with antibiotics for bronchitis.  I do not hear evidence of pneumonia on your exam. Return next week if you have persistent discolored phlegm, fever, not seeing any improvement. You should be at least 50% better by the time you take the last pill, but remember that it stays in your system for another 5 days.    Go to ER for recurrent severe headache, persistent vomiting, mental status (confusion, lethargy), or other concerns.  Otherwise, follow up next week if not better, sooner if worse.   Head Injury, Adult You have received a head injury. It does not appear serious at this time. Headaches and vomiting are common following head injury. It should be easy to awaken from sleeping. Sometimes it is necessary for you to stay in the emergency department for a while for observation. Sometimes admission to the hospital may be needed. After injuries such as yours, most problems occur within the first 24 hours, but side effects may occur up to 7-10 days after the injury. It is important for you to carefully monitor your condition and contact your health care provider or seek immediate medical care if there is a change in your condition. WHAT ARE THE TYPES OF HEAD INJURIES? Head injuries can be as minor as a bump. Some head injuries can be more severe. More severe head injuries include:  A jarring injury to the brain (concussion).  A bruise of the brain (contusion). This mean there is bleeding in the brain that can cause swelling.  A cracked skull (skull fracture).  Bleeding in the brain that collects, clots, and forms a bump (hematoma). WHAT CAUSES A HEAD INJURY? A serious head injury is most likely to happen  to someone who is in a car wreck and is not wearing a seat belt. Other causes of major head injuries include bicycle or motorcycle accidents, sports injuries, and falls. HOW ARE HEAD INJURIES DIAGNOSED? A complete history of the event leading to the injury and your current symptoms will be helpful in diagnosing head injuries. Many times, pictures of the brain, such as CT or MRI are needed to see the extent of the injury. Often, an overnight hospital stay is necessary for observation.  WHEN SHOULD I SEEK IMMEDIATE MEDICAL CARE?  You should get help right away if:  You have confusion or drowsiness.  You feel sick to your stomach (nauseous) or have continued, forceful vomiting.  You have dizziness or unsteadiness that is getting worse.  You have severe, continued headaches not relieved by medicine. Only take over-the-counter or prescription medicines for pain, fever, or discomfort as directed by your health care provider.  You do not have normal function of the arms or legs or are unable to walk.  You notice changes in the black spots in the center of the colored part of your eye (pupil).  You have a clear or bloody fluid coming from your nose or ears.  You have a loss of vision. During the next 24 hours after the injury, you must stay with someone who can watch you for the warning signs. This person should contact local emergency services (911 in the [place] if you have seizures, you become unconscious, or you are  unable to wake up. HOW CAN I PREVENT A HEAD INJURY IN THE FUTURE? The most important factor for preventing major head injuries is avoiding motor vehicle accidents. To minimize the potential for damage to your head, it is crucial to wear seat belts while riding in motor vehicles. Wearing helmets while bike riding and playing collision sports (like football) is also helpful. Also, avoiding dangerous activities around the house will further help reduce your risk of head injury.  WHEN CAN  I RETURN TO NORMAL ACTIVITIES AND ATHLETICS? You should be reevaluated by your health care provider before returning to these activities. If you have any of the following symptoms, you should not return to activities or contact sports until 1 week after the symptoms have stopped:  Persistent headache.  Dizziness or vertigo.  Poor attention and concentration.  Confusion.  Memory problems.  Nausea or vomiting.  Fatigue or tire easily.  Irritability.  Intolerant of bright lights or loud noises.  Anxiety or depression.  Disturbed sleep. MAKE SURE YOU:   Understand these instructions.  Will watch your condition.  Will get help right away if you are not doing well or get worse.   This information is not intended to replace advice given to you by your health care provider. Make sure you discuss any questions you have with your health care provider.   Document Released: 09/15/2005 Document Revised: 10/06/2014 Document Reviewed: 05/23/2013 Elsevier Interactive Patient Education [employer].

## 2016-01-01 ENCOUNTER — Encounter: Payer: Self-pay | Admitting: Podiatry

## 2016-01-08 ENCOUNTER — Other Ambulatory Visit: Payer: Self-pay | Admitting: Medical

## 2016-01-15 ENCOUNTER — Encounter: Payer: Self-pay | Admitting: Podiatry

## 2016-01-15 ENCOUNTER — Ambulatory Visit (INDEPENDENT_AMBULATORY_CARE_PROVIDER_SITE_OTHER): Payer: Medicare Other | Admitting: Podiatry

## 2016-01-15 DIAGNOSIS — Z79899 Other long term (current) drug therapy: Secondary | ICD-10-CM

## 2016-01-15 DIAGNOSIS — L603 Nail dystrophy: Secondary | ICD-10-CM | POA: Diagnosis not present

## 2016-01-15 LAB — HEPATIC FUNCTION PANEL
ALK PHOS: 48 U/L (ref 33–130)
ALT: 7 U/L (ref 6–29)
AST: 17 U/L (ref 10–35)
Albumin: 3.6 g/dL (ref 3.6–5.1)
BILIRUBIN TOTAL: 0.4 mg/dL (ref 0.2–1.2)
Bilirubin, Direct: 0.1 mg/dL (ref <=0.2)
Indirect Bilirubin: 0.3 mg/dL (ref 0.2–1.2)
TOTAL PROTEIN: 6.3 g/dL (ref 6.1–8.1)

## 2016-01-15 MED ORDER — TERBINAFINE HCL 250 MG PO TABS: 250.0000 mg | ORAL_TABLET | Freq: Every day | ORAL | Status: DC

## 2016-01-15 NOTE — Progress Notes (Signed)
She presents today for her first month follow-up Lamisil therapy. She denies fever chills nausea vomiting muscle aches and pains rashes or itching. States that she is doing well with the medication.  Objective: Onychomycosis.  Assessment: Onychomycosis with long-term therapy Lamisil.  Plan: Requested a liver profile CBC and started her on 3 months of Lamisil 250 mg tablets 1 by mouth daily 90. Follow-up with her in 4 months.

## 2016-01-16 ENCOUNTER — Telehealth: Payer: Self-pay | Admitting: *Deleted

## 2016-01-16 NOTE — Telephone Encounter (Addendum)
-----   Message from Garrel Ridgel, [place] sent at 01/16/2016 12:25 PM EDT ----- Blood work is good and may continue medication.  Left message informing pt of Dr. Stephenie Acres orders.

## 2016-02-05 ENCOUNTER — Ambulatory Visit: Payer: Medicare Other | Admitting: Podiatry

## 2016-02-07 ENCOUNTER — Encounter: Payer: Self-pay | Admitting: Podiatry

## 2016-02-07 ENCOUNTER — Ambulatory Visit (INDEPENDENT_AMBULATORY_CARE_PROVIDER_SITE_OTHER): Payer: Medicare Other | Admitting: Podiatry

## 2016-02-07 ENCOUNTER — Ambulatory Visit (INDEPENDENT_AMBULATORY_CARE_PROVIDER_SITE_OTHER): Payer: Medicare Other

## 2016-02-07 VITALS — BP 124/72 | HR 76 | Resp 16

## 2016-02-07 DIAGNOSIS — M779 Enthesopathy, unspecified: Secondary | ICD-10-CM | POA: Diagnosis not present

## 2016-02-07 DIAGNOSIS — M2040 Other hammer toe(s) (acquired), unspecified foot: Secondary | ICD-10-CM

## 2016-02-07 DIAGNOSIS — M79672 Pain in left foot: Secondary | ICD-10-CM

## 2016-02-07 MED ORDER — TRIAMCINOLONE ACETONIDE 10 MG/ML IJ SUSP
10.0000 mg | Freq: Once | INTRAMUSCULAR | Status: AC
  Administered 2016-02-07: 10 mg

## 2016-02-07 NOTE — Progress Notes (Signed)
Subjective:     Patient ID: Kayla Guerrero, female   DOB: [birth year], 81 y.o.   MRN: [id number]  HPI patient presents stating she's getting pain on top of her left foot that makes it hard to wear shoe gear comfortably and it's been bothering her for the last few weeks and does not remember specific injury but did dropped a heavy piece of equipment on top of that several months ago   Review of Systems     Objective:   Physical Exam Neurovascular status intact muscle strength adequate with inflammation and pain dorsum right foot in the midtarsal joint area with inflammation and fluid buildup noted    Assessment:     Dorsal tendinitis of the midtarsal joint left    Plan:     H&P and x-rays reviewed. Today I did careful injection of the dorsal tendon complex 3 mg Kenalog 5 mg Xylocaine and instructed on physical therapy and shoe gear to be laced likely. Reappoint to recheck  X-rays reviewed indicated no indications of fracture or arthritic processes

## 2016-04-02 ENCOUNTER — Ambulatory Visit (INDEPENDENT_AMBULATORY_CARE_PROVIDER_SITE_OTHER): Payer: Medicare Other | Admitting: Family Medicine

## 2016-04-02 ENCOUNTER — Encounter: Payer: Self-pay | Admitting: Family Medicine

## 2016-04-02 VITALS — BP 140/86 | HR 68 | Ht 66.25 in | Wt 178.0 lb

## 2016-04-02 DIAGNOSIS — N952 Postmenopausal atrophic vaginitis: Secondary | ICD-10-CM

## 2016-04-02 DIAGNOSIS — N950 Postmenopausal bleeding: Secondary | ICD-10-CM | POA: Diagnosis not present

## 2016-04-02 NOTE — Progress Notes (Signed)
Chief Complaint  Patient presents with  . Vaginal Discharge    started about 4 days ago and is only at night. Started out brown in color but has changes to red now. No odor that she knows of. No abdominal pain or cramping.    When she woke up to go to the bathroom, she noticed brown discharge and staining in the underwear and on the bedsheets.  It is sticky, she thinks it is blood.  She has been wearing a pad during the day, and doesn't notice any bleeding (pad is clean), but at night it overflows the pad. Denies any urinary incontinence, abdominal or pelvic pain.  She had intercourse about a week ago, it was slightly painful at the time.  She didn't notice any bleeding after intercourse, not until about 2-3 days later.  They had intercourse last a few weeks prior. She doesn't take aleve regularly, just sporadic. She took an Aleve PM for the first time in months a few days ago, because she couldn't sleep.  She recalls that her maternal grandmother may have had something similar, recalling that she had started bleeding.  We have down in the history that she had stomach cancer, but it very well could have been uterine, she isn't sure.  PMH, PSH, SH, FH reviewed (see above).  Outpatient Encounter Prescriptions as of 04/02/2016  Medication Sig Note  . calcium carbonate (OS-CAL) 600 MG TABS Take 600 mg by mouth daily.     . Cholecalciferol (VITAMIN D) 2000 UNITS tablet Take 2,000 Units by mouth daily.   . fish oil-omega-3 fatty acids 1000 MG capsule Take 1 g by mouth daily.    [name] glucosamine-chondroitin 500-400 MG tablet Take 1 tablet by mouth 2 (two) times daily.    [name] levothyroxine (SYNTHROID, LEVOTHROID) 88 MCG tablet TAKE ONE TABLET BY MOUTH ONCE DAILY BEFORE BREAKFAST   . Magnesium 400 MG CAPS Take 1 capsule by mouth daily.     . Multiple Vitamin (MULTI-VITAMIN) tablet Take 1 tablet by mouth daily.     . Red Yeast Rice Extract (RED YEAST RICE PO) Take 1 tablet by mouth 3 (three) times daily.   04/19/2015: Takes 1 in the morning, 2 with dinner  . terbinafine (LAMISIL) 250 MG tablet Take 1 tablet (250 mg total) by mouth daily.   . calcium carbonate (TUMS - DOSED IN MG ELEMENTAL CALCIUM) 500 MG chewable tablet Chew 1-2 tablets by mouth as needed for indigestion or heartburn. Reported on 04/02/2016 10/18/2015: Hasn't needed  . naproxen sodium (ANAPROX) 220 MG tablet Take 220 mg by mouth as needed. Reported on 04/02/2016 04/06/2014: Uses only as needed (usually just with travel), not regularly   Facility-Administered Encounter Medications as of 04/02/2016  Medication  . influenza  inactive virus vaccine (FLUZONE/FLUARIX) injection 0.5 mL   Allergies  Allergen Reactions  . Levaquin [Levofloxacin Hemihydrate] Other (See Comments)  . Promethazine Hcl Other (See Comments)    fainting  . Sulfa Drugs Cross Reactors Swelling   ROS:  No fever, chills, URI symptoms, chest pain, shortness of breath, urinary complaints, nausea, vomiting.  Some mild chronic constipation. No other bleeding, no bruising, rash. No edema; no depression or anxiety.  Intermittent insomnia, relieved by OTC meds.  See HPI  PHYSICAL EXAM: BP 140/86 mmHg  Pulse 68  Ht 5' 6.25" (1.683 m)  Wt 178 lb (80.74 kg)  BMI 28.50 kg/m2  Well developed, pleasant female in no distress HEENT: PERRL, EOMI, conjunctiva and sclera are clear Neck: no lymphadenopathy,  thyromegaly or mass Heart: regular rate and rhythm without murmur Lungs: clear bilaterally Back: no CVA tenderness Abdomen: soft, nontender, no organomegaly or mass GU:  Atrophic changes, with some erythema, abrasion and healing fissure on the right with minimal bleeding.  There is clearly blood coming from higher up above this area.  On speculum exam, there is no significant amount of blood in the vaginal vault, and vaginal mucosa is normal. There is some dark brown mucus at the cervical os.  Bimanual exam--uterus doesn't feel enlarged, adnexa nontender and nonpalpable, no  massess Extremities: no edema Psych: normal mood, affect, hygiene and grooming Neuro: alert and oriented, cranial nerves intact, normal strength, gait   ASSESSMENT/PLAN:  Postmenopausal vaginal bleeding  Atrophic vaginitis   Postmenopausal bleeding. Discussed Ddx and concern for possible endometrial cancer (need to rule this out).  Refer to GYN--needs pelvic US (either done prior or through their office, depending on their preference and how long until appointment). She previously saw Dr. Nori Riis (but has been many years)  She has some atrophic vaginitis, but that does not appear to be the cause of her symptoms, given the blood seen on the cervical os. Differential discussed, as well as expected plan (endometrial biopsy).

## 2016-04-09 ENCOUNTER — Other Ambulatory Visit: Payer: Self-pay | Admitting: Obstetrics & Gynecology

## 2016-04-23 ENCOUNTER — Encounter: Payer: Self-pay | Admitting: Gynecologic Oncology

## 2016-04-23 ENCOUNTER — Other Ambulatory Visit (HOSPITAL_BASED_OUTPATIENT_CLINIC_OR_DEPARTMENT_OTHER): Payer: Medicare Other

## 2016-04-23 ENCOUNTER — Ambulatory Visit: Payer: Medicare Other | Attending: Gynecologic Oncology | Admitting: Gynecologic Oncology

## 2016-04-23 DIAGNOSIS — Z87891 Personal history of nicotine dependence: Secondary | ICD-10-CM | POA: Diagnosis not present

## 2016-04-23 DIAGNOSIS — Z6828 Body mass index (BMI) 28.0-28.9, adult: Secondary | ICD-10-CM | POA: Diagnosis not present

## 2016-04-23 DIAGNOSIS — Z833 Family history of diabetes mellitus: Secondary | ICD-10-CM | POA: Diagnosis not present

## 2016-04-23 DIAGNOSIS — K219 Gastro-esophageal reflux disease without esophagitis: Secondary | ICD-10-CM | POA: Diagnosis not present

## 2016-04-23 DIAGNOSIS — Z9841 Cataract extraction status, right eye: Secondary | ICD-10-CM | POA: Insufficient documentation

## 2016-04-23 DIAGNOSIS — Z800 Family history of malignant neoplasm of digestive organs: Secondary | ICD-10-CM | POA: Diagnosis not present

## 2016-04-23 DIAGNOSIS — Z8249 Family history of ischemic heart disease and other diseases of the circulatory system: Secondary | ICD-10-CM | POA: Diagnosis not present

## 2016-04-23 DIAGNOSIS — C541 Malignant neoplasm of endometrium: Secondary | ICD-10-CM | POA: Diagnosis not present

## 2016-04-23 DIAGNOSIS — F1010 Alcohol abuse, uncomplicated: Secondary | ICD-10-CM | POA: Insufficient documentation

## 2016-04-23 DIAGNOSIS — N959 Unspecified menopausal and perimenopausal disorder: Secondary | ICD-10-CM | POA: Insufficient documentation

## 2016-04-23 DIAGNOSIS — Z818 Family history of other mental and behavioral disorders: Secondary | ICD-10-CM | POA: Insufficient documentation

## 2016-04-23 DIAGNOSIS — Z23 Encounter for immunization: Secondary | ICD-10-CM | POA: Diagnosis not present

## 2016-04-23 DIAGNOSIS — Z803 Family history of malignant neoplasm of breast: Secondary | ICD-10-CM | POA: Insufficient documentation

## 2016-04-23 DIAGNOSIS — Z881 Allergy status to other antibiotic agents status: Secondary | ICD-10-CM | POA: Insufficient documentation

## 2016-04-23 DIAGNOSIS — E079 Disorder of thyroid, unspecified: Secondary | ICD-10-CM | POA: Diagnosis not present

## 2016-04-23 DIAGNOSIS — M13862 Other specified arthritis, left knee: Secondary | ICD-10-CM | POA: Insufficient documentation

## 2016-04-23 DIAGNOSIS — Z9842 Cataract extraction status, left eye: Secondary | ICD-10-CM | POA: Insufficient documentation

## 2016-04-23 DIAGNOSIS — Z9049 Acquired absence of other specified parts of digestive tract: Secondary | ICD-10-CM | POA: Diagnosis not present

## 2016-04-23 DIAGNOSIS — M8580 Other specified disorders of bone density and structure, unspecified site: Secondary | ICD-10-CM | POA: Insufficient documentation

## 2016-04-23 DIAGNOSIS — Z882 Allergy status to sulfonamides status: Secondary | ICD-10-CM | POA: Diagnosis not present

## 2016-04-23 DIAGNOSIS — E669 Obesity, unspecified: Secondary | ICD-10-CM | POA: Insufficient documentation

## 2016-04-23 DIAGNOSIS — Z86010 Personal history of colonic polyps: Secondary | ICD-10-CM | POA: Insufficient documentation

## 2016-04-23 LAB — BASIC METABOLIC PANEL
Anion Gap: 8 mEq/L (ref 3–11)
BUN: 19.9 mg/dL (ref 7.0–26.0)
CALCIUM: 9.2 mg/dL (ref 8.4–10.4)
CO2: 28 meq/L (ref 22–29)
CREATININE: 1 mg/dL (ref 0.6–1.1)
Chloride: 105 mEq/L (ref 98–109)
EGFR: 56 mL/min/{1.73_m2} — ABNORMAL LOW (ref >90)
GLUCOSE: 88 mg/dL (ref 70–140)
Potassium: 4.3 mEq/L (ref 3.5–5.1)
Sodium: 142 mEq/L (ref 136–145)

## 2016-04-23 NOTE — Addendum Note (Signed)
Addended by: Joylene John D on: 04/23/2016 12:26 PM   Modules accepted: Orders

## 2016-04-23 NOTE — Patient Instructions (Signed)
Plan to have a CT scan of the chest, abdomen, and pelvis on August 2 at [facility].  Nothing to eat or drink 4 hours before your test at 9:30am.  We will contact you with the results.                  Preparing for your Surgery  Plan for surgery on May 06, 2016 with Dr. Cleda Mccreedy.  You will be scheduled for a robotic assisted total hysterectomy, bilateral salpingo-oophorectomy, sentinel lymph node biopsy.    Pre-operative Testing -You will receive a phone call from presurgical testing at [facility] to arrange for a pre-operative testing appointment before your surgery.  This appointment normally occurs one to two weeks before your scheduled surgery.   -Bring your insurance card, copy of an advanced directive if applicable, medication list  -At that visit, you will be asked to sign a consent for a possible blood transfusion in case a transfusion becomes necessary during surgery.  The need for a blood transfusion is rare but having consent is a necessary part of your care.     -You should not be taking blood thinners or aspirin at least ten days prior to surgery unless instructed by your surgeon.  Day Before Surgery at Home -You will be asked to take in a light diet the day before surgery.  Avoid carbonated beverages.  You will be advised to have nothing to eat or drink after midnight the evening before.     Eat a light diet the day before surgery.  Examples including soups, broths, toast, yogurt, mashed potatoes.  Things to avoid include carbonated beverages (fizzy beverages), raw fruits and raw vegetables, or beans.    If your bowels are filled with gas, your surgeon will have difficulty visualizing your pelvic organs which increases your surgical risks.  Your role in recovery Your role is to become active as soon as directed by your doctor, while still giving yourself time to heal.  Rest when you feel tired. You will be asked to do the following in order to speed your  recovery:  - Cough and breathe deeply. This helps toclear and expand your lungs and can prevent pneumonia. You may be given a spirometer to practice deep breathing. A staff member will show you how to use the spirometer. - Do mild physical activity. Walking or moving your legs help your circulation and body functions return to normal. A staff member will help you when you try to walk and will provide you with simple exercises. Do not try to get up or walk alone the first time. - Actively manage your pain. Managing your pain lets you move in comfort. We will ask you to rate your pain on a scale of zero to 10. It is your responsibility to tell your doctor or nurse where and how much you hurt so your pain can be treated.  Special Considerations -If you are diabetic, you may be placed on insulin after surgery to have closer control over your blood sugars to promote healing and recovery.  This does not mean that you will be discharged on insulin.  If applicable, your oral antidiabetics will be resumed when you are tolerating a solid diet.  -Your final pathology results from surgery should be available by the Friday after surgery and the results will be relayed to you when available.   Blood Transfusion Information WHAT IS A BLOOD TRANSFUSION? A transfusion is the replacement of blood or some  of its parts. Blood is made up of multiple cells which provide different functions.  Red blood cells carry oxygen and are used for blood loss replacement.  White blood cells fight against infection.  Platelets control bleeding.  Plasma helps clot blood.  Other blood products are available for specialized needs, such as hemophilia or other clotting disorders. BEFORE THE TRANSFUSION  Who gives blood for transfusions?   You may be able to donate blood to be used at a later date on yourself (autologous donation).  Relatives can be asked to donate blood. This is generally not any safer than if you have  received blood from a stranger. The same precautions are taken to ensure safety when a relative's blood is donated.  Healthy volunteers who are fully evaluated to make sure their blood is safe. This is blood bank blood. Transfusion therapy is the safest it has ever been in the practice of medicine. Before blood is taken from a donor, a complete history is taken to make sure that person has no history of diseases nor engages in risky social behavior (examples are intravenous drug use or sexual activity with multiple partners). The donor's travel history is screened to minimize risk of transmitting infections, such as malaria. The donated blood is tested for signs of infectious diseases, such as HIV and hepatitis. The blood is then tested to be sure it is compatible with you in order to minimize the chance of a transfusion reaction. If you or a relative donates blood, this is often done in anticipation of surgery and is not appropriate for emergency situations. It takes many days to process the donated blood. RISKS AND COMPLICATIONS Although transfusion therapy is very safe and saves many lives, the main dangers of transfusion include:   Getting an infectious disease.  Developing a transfusion reaction. This is an allergic reaction to something in the blood you were given. Every precaution is taken to prevent this. The decision to have a blood transfusion has been considered carefully by your caregiver before blood is given. Blood is not given unless the benefits outweigh the risks.

## 2016-04-23 NOTE — Progress Notes (Signed)
Consult Note: Gyn-Onc  Consult was requested by Dr. Nori Riis for the evaluation of Kayla Guerrero 81 y.o. female  CC:  Chief Complaint  Patient presents with  . endometrial cancer    New Consultation    Assessment/Plan:  Ms. ALEXIAH Guerrero  is a 81 y.o.  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   A detailed discussion was held with the patient and her family with regard to to her endometrial cancer diagnosis. We discussed the standard management options for uterine cancer which includes surgery followed possibly by adjuvant therapy depending on the results of surgery. The options for surgical management include a hysterectomy and removal of the tubes and ovaries possibly with removal of pelvic and para-aortic lymph nodes or SLN biopsy. A minimally invasive approach including a robotic hysterectomy or laparoscopic hysterectomy have benefits including shorter hospital stay, recovery time and better wound healing. The alternative approach is an open hysterectomy. The patient has been counseled about these surgical options and the risks of surgery in general including infection, bleeding, damage to surrounding structures (including bowel, bladder, ureters, nerves or vessels), and the postoperative risks of PE/ DVT, and lymphedema. I extensively reviewed the additional risks of robotic hysterectomy including possible need for conversion to open laparotomy.  I discussed positioning during surgery of trendelenberg and risks of minor facial swelling and care we take in preoperative positioning.  After counseling and consideration of her options, she desires to proceed with robotic hysterectomy, BSO, SLN biopsy.   She will be seen by anesthesia for preoperative clearance and discussion of postoperative pain management.  She was given the opportunity to ask questions, which were answered to her satisfaction, and she is agreement with the above mentioned plan of care.  I discussed  that high grade serous cancers are associated with increased risk for distant metastases and for recurrence, therefore we will order preoperative CT imaging for staging purposes and I informed Alexsys that she is at a high probability for requiring adjuvant therapy with chemotherapy and or radiation. This will be definitively determined based on the final results from staging surgery.  HPI: Kayla Guerrero is an extremely pleasant 81 year old G2P2 who is seen in consultation at the request of Dr Nori Riis for high grade (serous) endometrial cancer.  The patient reports postmenopausal spotting since July, 2017. She was seen by Dr Nori Riis for this who performed a TVUS on 04/08/16 which revealed a 7.3x4x5.2cm uterus with a 3.3cm irregular mass within the endometrium. The ovaries were normal.   A hysteroscopy and D&C was performed on 04/09/16 with several polypoid structures seen and removed. 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   She is otherwise very healthy with some thyroid disease. She is of advanced age, but appears younger than stated age. She is extremely vital and independently mobile. She has had laparoscopic tubal and cholecystectomy but no other abdominal surgeries. She is overweight with a BMI of 29kg/m2. She has had 2 prior vaginal deliveries.  Of note, she has a family history a mother with rectal cancer, a maternal grandmother with gynecologic cancer (possibly uterine), and a maternal aunt with a history of breast cancer.  Current Meds:  Outpatient Encounter Prescriptions as of 04/23/2016  Medication Sig  . calcium carbonate (OS-CAL) 600 MG TABS Take 600 mg by mouth daily.    . Cholecalciferol (VITAMIN D) 2000 UNITS tablet Take 2,000 Units by mouth daily.  . fish oil-omega-3 fatty acids 1000 MG  capsule Take 1 g by mouth daily.   [name] glucosamine-chondroitin 500-400 MG tablet Take 1 tablet by mouth 2 (two) times daily.   [name] levothyroxine  (SYNTHROID, LEVOTHROID) 88 MCG tablet TAKE ONE TABLET BY MOUTH ONCE DAILY BEFORE BREAKFAST  . Magnesium 400 MG CAPS Take 1 capsule by mouth daily.    . Multiple Vitamin (MULTI-VITAMIN) tablet Take 1 tablet by mouth daily.    . Red Yeast Rice Extract (RED YEAST RICE PO) Take 1 tablet by mouth 3 (three) times daily.   . [DISCONTINUED] calcium carbonate (TUMS - DOSED IN MG ELEMENTAL CALCIUM) 500 MG chewable tablet Chew 1-2 tablets by mouth as needed for indigestion or heartburn. Reported on 04/02/2016  . [DISCONTINUED] naproxen sodium (ANAPROX) 220 MG tablet Take 220 mg by mouth as needed. Reported on 04/02/2016  . [DISCONTINUED] terbinafine (LAMISIL) 250 MG tablet Take 1 tablet (250 mg total) by mouth daily.   Facility-Administered Encounter Medications as of 04/23/2016  Medication  . influenza  inactive virus vaccine (FLUZONE/FLUARIX) injection 0.5 mL    Allergy:  Allergies  Allergen Reactions  . Levaquin [Levofloxacin Hemihydrate] Other (See Comments)  . Promethazine Hcl Other (See Comments)    fainting  . Sulfa Drugs Cross Reactors Swelling    Social Hx:   Social History   Social History  . Marital status: Married    Spouse name: N/A  . Number of children: 1  . Years of education: N/A   Occupational History  . retired Librarian, academic); Artist    Social History Main Topics  . Smoking status: Former Smoker    Quit date: 09/29/1980  . Smokeless tobacco: Never Used  . Alcohol use 0.0 oz/week     Comment: 1 glass of wine 3-4 times/week; hard liquor (highball) or a beer once a week  . Drug use: No  . Sexual activity: Not on file   Other Topics Concern  . Not on file   Social History Narrative   Lives with husband.  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 Son lives in [place]; granddaughter lives in [place] (with her mother--son is divorced)    Past Surgical Hx:  Past  Surgical History:  Procedure Laterality Date  . CATARACT EXTRACTION, BILATERAL    . CHOLECYSTECTOMY    . COLONOSCOPY  2010, 04/2013   Buccini  . PTERYGIUM EXCISION    . TUBAL LIGATION      Past Medical Hx:  Past Medical History:  Diagnosis Date  . Arthritis of knee, left   . Borderline osteopenia DEXA 2006 and 2012  . Cataract   . Cholesterol serum elevated   . Colon polyps   . FHx: colon cancer   . GERD (gastroesophageal reflux disease)   . Obesity     Past Gynecological History:  Tubal ligation, SVD x 2. No LMP recorded. Patient is postmenopausal.  Family Hx:  Family History  Problem Relation Age of Onset  . Cancer Mother 49    COLON   . Heart disease Mother   . Cancer Maternal Aunt   . Breast cancer Maternal Aunt   . Mental illness Maternal Uncle   . Cancer Maternal Grandmother     stomach  . Macular degeneration Maternal Grandmother   . Diabetes Neg Hx     Review of Systems:  Constitutional  Feels well,    ENT Normal appearing ears and nares bilaterally Skin/Breast  No rash,  sores, jaundice, itching, dryness Cardiovascular  No chest pain, shortness of breath, or edema  Pulmonary  No cough or wheeze.  Gastro Intestinal  No nausea, vomitting, or diarrhoea. No bright red blood per rectum, no abdominal pain, change in bowel movement, or constipation.  Genito Urinary  No frequency, urgency, dysuria, + postmenopausal bleeding. Musculo Skeletal  No myalgia, arthralgia, joint swelling or pain  Neurologic  No weakness, numbness, change in gait,  Psychology  No depression, anxiety, insomnia.   Vitals:  Blood pressure (!) 148/83, pulse 80, temperature 97.7 F (36.5 C), temperature source Oral, resp. rate 18, weight 180 lb 4.8 oz (81.8 kg), SpO2 99 %.  Physical Exam: WD in NAD Neck  Supple NROM, without any enlargements.  Lymph Node Survey No cervical supraclavicular or inguinal adenopathy Cardiovascular  Pulse normal rate, regularity and rhythm. S1 and  S2 normal.  Lungs  Clear to auscultation bilateraly, without wheezes/crackles/rhonchi. Good air movement.  Skin  No rash/lesions/breakdown  Psychiatry  Alert and oriented to person, place, and time  Abdomen  Normoactive bowel sounds, abdomen soft, non-tender and overweight but nonobese without evidence of hernia.  Back No CVA tenderness Genito Urinary  Vulva/vagina: Normal external female genitalia.   No lesions. No discharge or bleeding.  Bladder/urethra:  No lesions or masses, well supported bladder  Vagina: normal, narrow introitus  Cervix: Normal appearing, no lesions.  Uterus:  Small, mobile, no parametrial involvement or nodularity.  Adnexa: no palpable masses. Rectal  deferred Extremities  No bilateral cyanosis, clubbing or edema.   Donaciano Eva, MD  04/23/2016, 12:13 PM

## 2016-04-30 ENCOUNTER — Telehealth: Payer: Self-pay | Admitting: Gynecologic Oncology

## 2016-04-30 ENCOUNTER — Ambulatory Visit (HOSPITAL_COMMUNITY)
Admission: RE | Admit: 2016-04-30 | Discharge: 2016-04-30 | Disposition: A | Discharge status: Home / Self Care | Payer: Medicare Other | Source: Ambulatory Visit | Attending: Gynecologic Oncology | Admitting: Gynecologic Oncology

## 2016-04-30 ENCOUNTER — Encounter (HOSPITAL_COMMUNITY): Payer: Self-pay

## 2016-04-30 DIAGNOSIS — N289 Disorder of kidney and ureter, unspecified: Secondary | ICD-10-CM | POA: Diagnosis not present

## 2016-04-30 DIAGNOSIS — C541 Malignant neoplasm of endometrium: Secondary | ICD-10-CM | POA: Diagnosis present

## 2016-04-30 DIAGNOSIS — I700 Atherosclerosis of aorta: Secondary | ICD-10-CM | POA: Diagnosis not present

## 2016-04-30 DIAGNOSIS — K5641 Fecal impaction: Secondary | ICD-10-CM | POA: Insufficient documentation

## 2016-04-30 MED ORDER — IOPAMIDOL (ISOVUE-300) INJECTION 61%
100.0000 mL | Freq: Once | INTRAVENOUS | Status: AC | PRN
  Administered 2016-04-30: 100 mL via INTRAVENOUS

## 2016-04-30 MED ORDER — IOPAMIDOL (ISOVUE-300) INJECTION 61%
100.0000 mL | Freq: Once | INTRAVENOUS | Status: AC | PRN
  Administered 2016-04-30: 50 mL via INTRAVENOUS

## 2016-04-30 NOTE — Telephone Encounter (Signed)
Spoke with patient about CT scan results.  Advised her of Dr. Elenora Gamma recommendations to proceed with drinking a bottle of magnesium citrate on Sunday before her surgery to clean out her bowels.  Also advised of recommendation for follow up imaging to follow the renal lesion seen on CT in six months.  All questions answered.  No concerns voiced.  Advised to call for any needs or concerns.

## 2016-04-30 NOTE — Patient Instructions (Signed)
Kayla Guerrero  04/30/2016   Your procedure is scheduled on: 05/06/2016    Report to [facility] Main  Entrance take [place]  elevators to 3rd floor to  [place] at    [place] AM.  Call this number if you have problems the morning of surgery [phone]   Remember: ONLY 1 PERSON MAY GO WITH YOU TO SHORT STAY TO GET  READY MORNING OF [place].  Do not eat food or drink liquids :After Midnight.              Eat a light diet the day before surgery.  Examples include: toast, yogurt, mashed potatoes , soups and broths.  Things to avoid include: carbonated beverages, raw fruits and vegetables and beans.      Take these medicines the morning of surgery with A SIP OF WATER:Synthroid                                 You may not have any metal on your body including hair pins and              piercings  Do not wear jewelry, make-up, lotions, powders or perfumes, deodorant             Do not wear nail polish.  Do not shave  48 hours prior to surgery.                Do not bring valuables to the hospital. [place].  Contacts, dentures or bridgework may not be worn into surgery.  Leave suitcase in the car. After surgery it may be brought to your room.       Special Instructions: coughing and deep breathing exercises, leg exercises               Please read over the following fact sheets you were given: _____________________________________________________________________             [facility] - Preparing for Surgery Before surgery, you can play an important role.  Because skin is not sterile, your skin needs to be as free of germs as possible.  You can reduce the number of germs on your skin by washing with CHG (chlorahexidine gluconate) soap before surgery.  CHG is an antiseptic cleaner which kills germs and bonds with the skin to continue killing germs even after washing. Please DO NOT use if you have an  allergy to CHG or antibacterial soaps.  If your skin becomes reddened/irritated stop using the CHG and inform your nurse when you arrive at Short Stay. Do not shave (including legs and underarms) for at least 48 hours prior to the first CHG shower.  You may shave your face/neck. Please follow these instructions carefully:  1.  Shower with CHG Soap the night before surgery and the  morning of Surgery.  2.  If you choose to wash your hair, wash your hair first as usual with your  normal  shampoo.  3.  After you shampoo, rinse your hair and body thoroughly to remove the  shampoo.                           4.  Use CHG as you would any other liquid soap.  You can apply chg directly  to the skin and wash                       Gently with a scrungie or clean washcloth.  5.  Apply the CHG Soap to your body ONLY FROM THE NECK DOWN.   Do not use on face/ open                           Wound or open sores. Avoid contact with eyes, ears mouth and genitals (private parts).                       Wash face,  Genitals (private parts) with your normal soap.             6.  Wash thoroughly, paying special attention to the area where your surgery  will be performed.  7.  Thoroughly rinse your body with warm water from the neck down.  8.  DO NOT shower/wash with your normal soap after using and rinsing off  the CHG Soap.                9.  Pat yourself dry with a clean towel.            10.  Wear clean pajamas.            11.  Place clean sheets on your bed the night of your first shower and do not  sleep with pets. Day of Surgery : Do not apply any lotions/deodorants the morning of surgery.  Please wear clean clothes to the hospital/surgery center.  FAILURE TO FOLLOW THESE INSTRUCTIONS MAY RESULT IN THE CANCELLATION OF YOUR SURGERY PATIENT SIGNATURE_________________________________  NURSE  SIGNATURE__________________________________  ________________________________________________________________________  WHAT IS A BLOOD TRANSFUSION? Blood Transfusion Information  A transfusion is the replacement of blood or some of its parts. Blood is made up of multiple cells which provide different functions.  Red blood cells carry oxygen and are used for blood loss replacement.  White blood cells fight against infection.  Platelets control bleeding.  Plasma helps clot blood.  Other blood products are available for specialized needs, such as hemophilia or other clotting disorders. BEFORE THE TRANSFUSION  Who gives blood for transfusions?   Healthy volunteers who are fully evaluated to make sure their blood is safe. This is blood bank blood. Transfusion therapy is the safest it has ever been in the practice of medicine. Before blood is taken from a donor, a complete history is taken to make sure that person has no history of diseases nor engages in risky social behavior (examples are intravenous drug use or sexual activity with multiple partners). The donor's travel history is screened to minimize risk of transmitting infections, such as malaria. The donated blood is tested for signs of infectious diseases, such as HIV and hepatitis. The blood is then tested to be sure it is compatible with you in order to minimize the chance of a transfusion reaction. If you or a relative donates blood, this is often done in anticipation of surgery and is not appropriate for emergency situations. It takes many days to process the donated blood. RISKS AND COMPLICATIONS Although transfusion therapy is very safe and saves many lives, the main dangers of transfusion include:   Getting an infectious disease.  Developing a transfusion reaction. This is an  allergic reaction to something in the blood you were given. Every precaution is taken to prevent this. The decision to have a blood transfusion has been  considered carefully by your caregiver before blood is given. Blood is not given unless the benefits outweigh the risks. AFTER THE TRANSFUSION  Right after receiving a blood transfusion, you will usually feel much better and more energetic. This is especially true if your red blood cells have gotten low (anemic). The transfusion raises the level of the red blood cells which carry oxygen, and this usually causes an energy increase.  The nurse administering the transfusion will monitor you carefully for complications. HOME CARE INSTRUCTIONS  No special instructions are needed after a transfusion. You may find your energy is better. Speak with your caregiver about any limitations on activity for underlying diseases you may have. SEEK MEDICAL CARE IF:   Your condition is not improving after your transfusion.  You develop redness or irritation at the intravenous (IV) site. SEEK IMMEDIATE MEDICAL CARE IF:  Any of the following symptoms occur over the next 12 hours:  Shaking chills.  You have a temperature by mouth above 102 F (38.9 C), not controlled by medicine.  Chest, back, or muscle pain.  People around you feel you are not acting correctly or are confused.  Shortness of breath or difficulty breathing.  Dizziness and fainting.  You get a rash or develop hives.  You have a decrease in urine output.  Your urine turns a dark color or changes to pink, red, or brown. Any of the following symptoms occur over the next 10 days:  You have a temperature by mouth above 102 F (38.9 C), not controlled by medicine.  Shortness of breath.  Weakness after normal activity.  The white part of the eye turns yellow (jaundice).  You have a decrease in the amount of urine or are urinating less often.  Your urine turns a dark color or changes to pink, red, or brown. Document Released: 09/12/2000 Document Revised: 12/08/2011 Document Reviewed: 05/01/2008 ExitCare Patient Information 2014  [place].  _______________________________________________________________________  Incentive Spirometer  An incentive spirometer is a tool that can help keep your lungs clear and active. This tool measures how well you are filling your lungs with each breath. Taking long deep breaths may help reverse or decrease the chance of developing breathing (pulmonary) problems (especially infection) following:  A long period of time when you are unable to move or be active. BEFORE THE PROCEDURE   If the spirometer includes an indicator to show your best effort, your nurse or respiratory therapist will set it to a desired goal.  If possible, sit up straight or lean slightly forward. Try not to slouch.  Hold the incentive spirometer in an upright position. INSTRUCTIONS FOR USE  1. Sit on the edge of your bed if possible, or sit up as far as you can in bed or on a chair. 2. Hold the incentive spirometer in an upright position. 3. Breathe out normally. 4. Place the mouthpiece in your mouth and seal your lips tightly around it. 5. Breathe in slowly and as deeply as possible, raising the piston or the ball toward the top of the column. 6. Hold your breath for 3-5 seconds or for as long as possible. Allow the piston or ball to fall to the bottom of the column. 7. Remove the mouthpiece from your mouth and breathe out normally. 8. Rest for a few seconds and repeat Steps 1 through 7  at least 10 times every 1-2 hours when you are awake. Take your time and take a few normal breaths between deep breaths. 9. The spirometer may include an indicator to show your best effort. Use the indicator as a goal to work toward during each repetition. 10. After each set of 10 deep breaths, practice coughing to be sure your lungs are clear. If you have an incision (the cut made at the time of surgery), support your incision when coughing by placing a pillow or rolled up towels firmly against it. Once you are able to get  out of bed, walk around indoors and cough well. You may stop using the incentive spirometer when instructed by your caregiver.  RISKS AND COMPLICATIONS  Take your time so you do not get dizzy or light-headed.  If you are in pain, you may need to take or ask for pain medication before doing incentive spirometry. It is harder to take a deep breath if you are having pain. AFTER USE  Rest and breathe slowly and easily.  It can be helpful to keep track of a log of your progress. Your caregiver can provide you with a simple table to help with this. If you are using the spirometer at home, follow these instructions: [place] IF:   You are having difficultly using the spirometer.  You have trouble using the spirometer as often as instructed.  Your pain medication is not giving enough relief while using the spirometer.  You develop fever of 100.5 F (38.1 C) or higher. SEEK IMMEDIATE MEDICAL CARE IF:   You cough up bloody sputum that had not been present before.  You develop fever of 102 F (38.9 C) or greater.  You develop worsening pain at or near the incision site. MAKE SURE YOU:   Understand these instructions.  Will watch your condition.  Will get help right away if you are not doing well or get worse. Document Released: 01/26/2007 Document Revised: 12/08/2011 Document Reviewed: 03/29/2007 [facility] Patient Information 2014 [place], [place].   ________________________________________________________________________

## 2016-05-01 ENCOUNTER — Encounter (HOSPITAL_COMMUNITY)
Admission: RE | Admit: 2016-05-01 | Discharge: 2016-05-01 | Disposition: A | Discharge status: Home / Self Care | Payer: Medicare Other | Source: Ambulatory Visit | Attending: Gynecologic Oncology | Admitting: Gynecologic Oncology

## 2016-05-01 ENCOUNTER — Encounter (HOSPITAL_COMMUNITY): Payer: Self-pay | Admitting: *Deleted

## 2016-05-01 ENCOUNTER — Encounter (HOSPITAL_COMMUNITY): Payer: Self-pay

## 2016-05-01 DIAGNOSIS — Z01812 Encounter for preprocedural laboratory examination: Secondary | ICD-10-CM | POA: Diagnosis not present

## 2016-05-01 DIAGNOSIS — Z0183 Encounter for blood typing: Secondary | ICD-10-CM | POA: Insufficient documentation

## 2016-05-01 DIAGNOSIS — C541 Malignant neoplasm of endometrium: Secondary | ICD-10-CM | POA: Diagnosis not present

## 2016-05-01 LAB — CBC WITH DIFFERENTIAL/PLATELET
BASOS ABS: 0.1 10*3/uL (ref 0.0–0.1)
BASOS PCT: 1 %
Eosinophils Absolute: 0.2 10*3/uL (ref 0.0–0.7)
Eosinophils Relative: 4 %
HEMATOCRIT: 40.5 % (ref 36.0–46.0)
HEMOGLOBIN: 13.4 g/dL (ref 12.0–15.0)
Lymphocytes Relative: 40 %
Lymphs Abs: 2.2 10*3/uL (ref 0.7–4.0)
MCH: 29.3 pg (ref 26.0–34.0)
MCHC: 33.1 g/dL (ref 30.0–36.0)
MCV: 88.6 fL (ref 78.0–100.0)
Monocytes Absolute: 0.2 10*3/uL (ref 0.1–1.0)
Monocytes Relative: 4 %
NEUTROS ABS: 2.9 10*3/uL (ref 1.7–7.7)
NEUTROS PCT: 51 %
Platelets: 261 10*3/uL (ref 150–400)
RBC: 4.57 MIL/uL (ref 3.87–5.11)
RDW: 14 % (ref 11.5–15.5)
WBC: 5.5 10*3/uL (ref 4.0–10.5)

## 2016-05-01 LAB — COMPREHENSIVE METABOLIC PANEL
ALBUMIN: 4 g/dL (ref 3.5–5.0)
ALK PHOS: 49 U/L (ref 38–126)
ALT: 12 U/L — ABNORMAL LOW (ref 14–54)
ANION GAP: 7 (ref 5–15)
AST: 24 U/L (ref 15–41)
BILIRUBIN TOTAL: 0.6 mg/dL (ref 0.3–1.2)
BUN: 13 mg/dL (ref 6–20)
CALCIUM: 8.9 mg/dL (ref 8.9–10.3)
CO2: 29 mmol/L (ref 22–32)
Chloride: 104 mmol/L (ref 101–111)
Creatinine, Ser: 0.93 mg/dL (ref 0.44–1.00)
GFR calc non Af Amer: 57 mL/min — ABNORMAL LOW (ref >60)
GLUCOSE: 93 mg/dL (ref 65–99)
POTASSIUM: 4.5 mmol/L (ref 3.5–5.1)
Sodium: 140 mmol/L (ref 135–145)
TOTAL PROTEIN: 7.1 g/dL (ref 6.5–8.1)

## 2016-05-01 LAB — ABO/RH: ABO/RH(D): A POS

## 2016-05-02 LAB — CA 125: CA 125: 23.2 U/mL (ref 0.0–38.1)

## 2016-05-06 ENCOUNTER — Encounter (HOSPITAL_COMMUNITY): Payer: Self-pay

## 2016-05-06 ENCOUNTER — Ambulatory Visit (HOSPITAL_COMMUNITY)
Admission: RE | Admit: 2016-05-06 | Discharge: 2016-05-07 | Disposition: A | Discharge status: Home / Self Care | Payer: Medicare Other | Source: Ambulatory Visit | Attending: Obstetrics & Gynecology | Admitting: Obstetrics & Gynecology

## 2016-05-06 ENCOUNTER — Ambulatory Visit (HOSPITAL_COMMUNITY): Payer: Medicare Other | Admitting: Certified Registered Nurse Anesthetist

## 2016-05-06 ENCOUNTER — Encounter (HOSPITAL_COMMUNITY)
Admission: RE | Disposition: A | Discharge status: Home / Self Care | Payer: Self-pay | Source: Ambulatory Visit | Attending: Obstetrics & Gynecology

## 2016-05-06 DIAGNOSIS — Z800 Family history of malignant neoplasm of digestive organs: Secondary | ICD-10-CM | POA: Insufficient documentation

## 2016-05-06 DIAGNOSIS — Z803 Family history of malignant neoplasm of breast: Secondary | ICD-10-CM | POA: Insufficient documentation

## 2016-05-06 DIAGNOSIS — N83202 Unspecified ovarian cyst, left side: Secondary | ICD-10-CM | POA: Diagnosis not present

## 2016-05-06 DIAGNOSIS — C541 Malignant neoplasm of endometrium: Secondary | ICD-10-CM | POA: Diagnosis present

## 2016-05-06 DIAGNOSIS — D259 Leiomyoma of uterus, unspecified: Secondary | ICD-10-CM | POA: Insufficient documentation

## 2016-05-06 DIAGNOSIS — C542 Malignant neoplasm of myometrium: Secondary | ICD-10-CM | POA: Insufficient documentation

## 2016-05-06 DIAGNOSIS — Z87891 Personal history of nicotine dependence: Secondary | ICD-10-CM | POA: Insufficient documentation

## 2016-05-06 DIAGNOSIS — E039 Hypothyroidism, unspecified: Secondary | ICD-10-CM | POA: Diagnosis not present

## 2016-05-06 DIAGNOSIS — Z79899 Other long term (current) drug therapy: Secondary | ICD-10-CM | POA: Insufficient documentation

## 2016-05-06 DIAGNOSIS — M1712 Unilateral primary osteoarthritis, left knee: Secondary | ICD-10-CM | POA: Diagnosis not present

## 2016-05-06 DIAGNOSIS — N83201 Unspecified ovarian cyst, right side: Secondary | ICD-10-CM | POA: Diagnosis not present

## 2016-05-06 HISTORY — DX: Malignant (primary) neoplasm, unspecified: C80.1

## 2016-05-06 HISTORY — DX: Hypothyroidism, unspecified: E03.9

## 2016-05-06 HISTORY — DX: Unspecified osteoarthritis, unspecified site: M19.90

## 2016-05-06 HISTORY — PX: ROBOTIC ASSISTED TOTAL HYSTERECTOMY WITH BILATERAL SALPINGO OOPHERECTOMY: SHX6086

## 2016-05-06 HISTORY — PX: SENTINEL NODE BIOPSY: SHX6608

## 2016-05-06 LAB — TYPE AND SCREEN
ABO/RH(D): A POS
ANTIBODY SCREEN: NEGATIVE

## 2016-05-06 SURGERY — HYSTERECTOMY, TOTAL, ROBOT-ASSISTED, LAPAROSCOPIC, WITH BILATERAL SALPINGO-OOPHORECTOMY
Anesthesia: General

## 2016-05-06 MED ORDER — HYDROMORPHONE HCL 1 MG/ML IJ SOLN
0.2500 mg | INTRAMUSCULAR | Status: DC | PRN
  Administered 2016-05-06 (×4): 0.5 mg via INTRAVENOUS

## 2016-05-06 MED ORDER — OXYCODONE-ACETAMINOPHEN 5-325 MG PO TABS
1.0000 | ORAL_TABLET | ORAL | Status: DC | PRN
  Administered 2016-05-06 – 2016-05-07 (×5): 1 via ORAL
  Filled 2016-05-06 (×5): qty 1

## 2016-05-06 MED ORDER — STERILE WATER FOR INJECTION IJ SOLN
INTRAMUSCULAR | Status: AC
  Filled 2016-05-06: qty 10

## 2016-05-06 MED ORDER — ROCURONIUM BROMIDE 100 MG/10ML IV SOLN
INTRAVENOUS | Status: AC
  Filled 2016-05-06: qty 1

## 2016-05-06 MED ORDER — LIDOCAINE 2% (20 MG/ML) 5 ML SYRINGE
INTRAMUSCULAR | Status: DC | PRN
  Administered 2016-05-06: 60 mg via INTRAVENOUS

## 2016-05-06 MED ORDER — ENOXAPARIN SODIUM 40 MG/0.4ML ~~LOC~~ SOLN
40.0000 mg | SUBCUTANEOUS | Status: DC
  Administered 2016-05-07: 40 mg via SUBCUTANEOUS
  Filled 2016-05-06: qty 0.4

## 2016-05-06 MED ORDER — KCL IN DEXTROSE-NACL 20-5-0.45 MEQ/L-%-% IV SOLN
INTRAVENOUS | Status: DC
  Administered 2016-05-06: 1000 mL via INTRAVENOUS
  Administered 2016-05-06: 13:00:00 via INTRAVENOUS
  Filled 2016-05-06 (×2): qty 1000

## 2016-05-06 MED ORDER — IBUPROFEN 800 MG PO TABS: 800.0000 mg | ORAL_TABLET | Freq: Three times a day (TID) | ORAL | Status: DC | PRN

## 2016-05-06 MED ORDER — FENTANYL CITRATE (PF) 100 MCG/2ML IJ SOLN
INTRAMUSCULAR | Status: DC | PRN
  Administered 2016-05-06 (×2): 50 ug via INTRAVENOUS
  Administered 2016-05-06: 100 ug via INTRAVENOUS
  Administered 2016-05-06: 50 ug via INTRAVENOUS

## 2016-05-06 MED ORDER — STERILE WATER FOR IRRIGATION IR SOLN
Status: DC | PRN
  Administered 2016-05-06: 1000 mL

## 2016-05-06 MED ORDER — ONDANSETRON HCL 4 MG PO TABS: 4.0000 mg | ORAL_TABLET | Freq: Four times a day (QID) | ORAL | Status: DC | PRN

## 2016-05-06 MED ORDER — ROCURONIUM BROMIDE 100 MG/10ML IV SOLN
INTRAVENOUS | Status: DC | PRN
  Administered 2016-05-06: 20 mg via INTRAVENOUS
  Administered 2016-05-06: 50 mg via INTRAVENOUS

## 2016-05-06 MED ORDER — ARTIFICIAL TEARS OP OINT
TOPICAL_OINTMENT | OPHTHALMIC | Status: AC
  Filled 2016-05-06: qty 3.5

## 2016-05-06 MED ORDER — HYDROMORPHONE HCL 1 MG/ML IJ SOLN
0.2000 mg | INTRAMUSCULAR | Status: DC | PRN
  Administered 2016-05-06: 0.4 mg via INTRAVENOUS
  Filled 2016-05-06: qty 1

## 2016-05-06 MED ORDER — ENOXAPARIN SODIUM 40 MG/0.4ML ~~LOC~~ SOLN
40.0000 mg | SUBCUTANEOUS | Status: AC
  Administered 2016-05-06: 40 mg via SUBCUTANEOUS
  Filled 2016-05-06: qty 0.4

## 2016-05-06 MED ORDER — CEFAZOLIN SODIUM-DEXTROSE 2-4 GM/100ML-% IV SOLN
INTRAVENOUS | Status: AC
  Filled 2016-05-06: qty 100

## 2016-05-06 MED ORDER — ARTIFICIAL TEARS OP OINT
TOPICAL_OINTMENT | OPHTHALMIC | Status: DC | PRN
  Administered 2016-05-06: 1 via OPHTHALMIC

## 2016-05-06 MED ORDER — PHENYLEPHRINE HCL 10 MG/ML IJ SOLN
INTRAMUSCULAR | Status: DC | PRN
  Administered 2016-05-06: 80 ug via INTRAVENOUS

## 2016-05-06 MED ORDER — ONDANSETRON HCL 4 MG/2ML IJ SOLN: 4.0000 mg | Freq: Four times a day (QID) | INTRAMUSCULAR | Status: DC | PRN

## 2016-05-06 MED ORDER — SUGAMMADEX SODIUM 200 MG/2ML IV SOLN
INTRAVENOUS | Status: DC | PRN
  Administered 2016-05-06: 200 mg via INTRAVENOUS

## 2016-05-06 MED ORDER — CEFAZOLIN SODIUM-DEXTROSE 2-4 GM/100ML-% IV SOLN
2.0000 g | INTRAVENOUS | Status: AC
  Administered 2016-05-06: 2 g via INTRAVENOUS
  Filled 2016-05-06: qty 100

## 2016-05-06 MED ORDER — ONDANSETRON HCL 4 MG/2ML IJ SOLN
INTRAMUSCULAR | Status: DC | PRN
  Administered 2016-05-06: 4 mg via INTRAVENOUS

## 2016-05-06 MED ORDER — PROMETHAZINE HCL 25 MG/ML IJ SOLN: 6.2500 mg | INTRAMUSCULAR | Status: DC | PRN

## 2016-05-06 MED ORDER — TRAMADOL HCL 50 MG PO TABS: 100.0000 mg | ORAL_TABLET | Freq: Two times a day (BID) | ORAL | Status: DC | PRN

## 2016-05-06 MED ORDER — LIP MEDEX EX OINT
TOPICAL_OINTMENT | CUTANEOUS | Status: AC
  Administered 2016-05-06: 13:00:00
  Filled 2016-05-06: qty 7

## 2016-05-06 MED ORDER — PROPOFOL 10 MG/ML IV BOLUS
INTRAVENOUS | Status: AC
  Filled 2016-05-06: qty 20

## 2016-05-06 MED ORDER — FENTANYL CITRATE (PF) 250 MCG/5ML IJ SOLN
INTRAMUSCULAR | Status: AC
  Filled 2016-05-06: qty 5

## 2016-05-06 MED ORDER — DEXAMETHASONE SODIUM PHOSPHATE 10 MG/ML IJ SOLN
INTRAMUSCULAR | Status: DC | PRN
  Administered 2016-05-06: 10 mg via INTRAVENOUS

## 2016-05-06 MED ORDER — ACETAMINOPHEN 10 MG/ML IV SOLN
INTRAVENOUS | Status: AC
  Filled 2016-05-06: qty 100

## 2016-05-06 MED ORDER — LEVOTHYROXINE SODIUM 88 MCG PO TABS
88.0000 ug | ORAL_TABLET | Freq: Every day | ORAL | Status: DC
  Administered 2016-05-07: 88 ug via ORAL
  Filled 2016-05-06: qty 1

## 2016-05-06 MED ORDER — HYDROMORPHONE HCL 1 MG/ML IJ SOLN
INTRAMUSCULAR | Status: AC
  Filled 2016-05-06: qty 1

## 2016-05-06 MED ORDER — PROPOFOL 10 MG/ML IV BOLUS
INTRAVENOUS | Status: DC | PRN
  Administered 2016-05-06: 100 mg via INTRAVENOUS

## 2016-05-06 MED ORDER — PHENYLEPHRINE 40 MCG/ML (10ML) SYRINGE FOR IV PUSH (FOR BLOOD PRESSURE SUPPORT)
PREFILLED_SYRINGE | INTRAVENOUS | Status: AC
  Filled 2016-05-06: qty 10

## 2016-05-06 MED ORDER — DEXAMETHASONE SODIUM PHOSPHATE 10 MG/ML IJ SOLN
INTRAMUSCULAR | Status: AC
  Filled 2016-05-06: qty 1

## 2016-05-06 MED ORDER — ONDANSETRON HCL 4 MG/2ML IJ SOLN
INTRAMUSCULAR | Status: AC
  Filled 2016-05-06: qty 2

## 2016-05-06 MED ORDER — SUGAMMADEX SODIUM 200 MG/2ML IV SOLN
INTRAVENOUS | Status: AC
  Filled 2016-05-06: qty 2

## 2016-05-06 MED ORDER — ACETAMINOPHEN 10 MG/ML IV SOLN
INTRAVENOUS | Status: DC | PRN
  Administered 2016-05-06: 1000 mg via INTRAVENOUS

## 2016-05-06 MED ORDER — LACTATED RINGERS IR SOLN
Status: DC | PRN
  Administered 2016-05-06: 1000 mL

## 2016-05-06 MED ORDER — LIDOCAINE HCL (CARDIAC) 20 MG/ML IV SOLN
INTRAVENOUS | Status: AC
  Filled 2016-05-06: qty 5

## 2016-05-06 MED ORDER — GABAPENTIN 300 MG PO CAPS
300.0000 mg | ORAL_CAPSULE | Freq: Every day | ORAL | Status: AC
  Administered 2016-05-06: 300 mg via ORAL
  Filled 2016-05-06: qty 1

## 2016-05-06 MED ORDER — LACTATED RINGERS IV SOLN
INTRAVENOUS | Status: DC | PRN
  Administered 2016-05-06 (×2): via INTRAVENOUS

## 2016-05-06 SURGICAL SUPPLY — 54 items
APL SKNCLS STERI-STRIP NONHPOA (GAUZE/BANDAGES/DRESSINGS)
BAG SPEC RTRVL LRG 6X4 10 (ENDOMECHANICALS) ×1
BENZOIN TINCTURE PRP APPL 2/3 (GAUZE/BANDAGES/DRESSINGS) ×1 IMPLANT
CHLORAPREP W/TINT 26ML (MISCELLANEOUS) ×3 IMPLANT
CLOSURE WOUND 1/2 X4 (GAUZE/BANDAGES/DRESSINGS)
COVER SURGICAL LIGHT HANDLE (MISCELLANEOUS) ×3 IMPLANT
COVER TIP SHEARS 8 DVNC (MISCELLANEOUS) ×1 IMPLANT
COVER TIP SHEARS 8MM DA VINCI (MISCELLANEOUS) ×2
DRAPE ARM DVNC X/XI (DISPOSABLE) ×4 IMPLANT
DRAPE COLUMN DVNC XI (DISPOSABLE) ×1 IMPLANT
DRAPE DA VINCI XI ARM (DISPOSABLE) ×8
DRAPE DA VINCI XI COLUMN (DISPOSABLE) ×2
DRAPE SHEET LG 3/4 BI-LAMINATE (DRAPES) ×6 IMPLANT
DRAPE SURG IRRIG POUCH 19X23 (DRAPES) ×3 IMPLANT
DRSG TEGADERM 2-3/8X2-3/4 SM (GAUZE/BANDAGES/DRESSINGS) IMPLANT
ELECT REM PT RETURN 9FT ADLT (ELECTROSURGICAL) ×3
ELECTRODE REM PT RTRN 9FT ADLT (ELECTROSURGICAL) ×1 IMPLANT
GAUZE SPONGE 2X2 8PLY STRL LF (GAUZE/BANDAGES/DRESSINGS) IMPLANT
GLOVE BIO SURGEON STRL SZ 6.5 (GLOVE) ×11 IMPLANT
GLOVE BIO SURGEONS STRL SZ 6.5 (GLOVE) ×6
GLOVE BIOGEL PI IND STRL 7.0 (GLOVE) ×2 IMPLANT
GLOVE BIOGEL PI INDICATOR 7.0 (GLOVE) ×4
GOWN STRL REUS W/ TWL LRG LVL3 (GOWN DISPOSABLE) ×3 IMPLANT
GOWN STRL REUS W/TWL LRG LVL3 (GOWN DISPOSABLE) ×15
HOLDER FOLEY CATH W/STRAP (MISCELLANEOUS) ×3 IMPLANT
IRRIG SUCT STRYKERFLOW 2 WTIP (MISCELLANEOUS) ×3
IRRIGATION SUCT STRKRFLW 2 WTP (MISCELLANEOUS) ×1 IMPLANT
KIT BASIN OR (CUSTOM PROCEDURE TRAY) ×3 IMPLANT
KIT PROCEDURE DA VINCI SI (MISCELLANEOUS) ×2
KIT PROCEDURE DVNC SI (MISCELLANEOUS) IMPLANT
MANIPULATOR UTERINE 4.5 ZUMI (MISCELLANEOUS) ×3 IMPLANT
OCCLUDER COLPOPNEUMO (BALLOONS) ×3 IMPLANT
PAD POSITIONING PINK XL (MISCELLANEOUS) ×3 IMPLANT
PORT ACCESS TROCAR AIRSEAL 12 (TROCAR) IMPLANT
PORT ACCESS TROCAR AIRSEAL 5M (TROCAR) ×2
POUCH SPECIMEN RETRIEVAL 10MM (ENDOMECHANICALS) ×2 IMPLANT
SEAL CANN UNIV 5-8 DVNC XI (MISCELLANEOUS) ×4 IMPLANT
SEAL XI 5MM-8MM UNIVERSAL (MISCELLANEOUS) ×8
SET TRI-LUMEN FLTR TB AIRSEAL (TUBING) ×2 IMPLANT
SHEET LAVH (DRAPES) ×3 IMPLANT
SOLUTION ELECTROLUBE (MISCELLANEOUS) ×3 IMPLANT
SPONGE GAUZE 2X2 STER 10/PKG (GAUZE/BANDAGES/DRESSINGS)
STRIP CLOSURE SKIN 1/2X4 (GAUZE/BANDAGES/DRESSINGS) ×1 IMPLANT
SUT VIC AB 0 CT1 27 (SUTURE) ×3
SUT VIC AB 0 CT1 27XBRD ANTBC (SUTURE) ×1 IMPLANT
SUT VIC AB 4-0 PS2 27 (SUTURE) ×6 IMPLANT
SYR 50ML LL SCALE MARK (SYRINGE) ×3 IMPLANT
TOWEL OR 17X26 10 PK STRL BLUE (TOWEL DISPOSABLE) ×6 IMPLANT
TRAP SPECIMEN MUCOUS 40CC (MISCELLANEOUS) ×1 IMPLANT
TRAY FOLEY W/METER SILVER 14FR (SET/KITS/TRAYS/PACK) ×3 IMPLANT
TRAY LAPAROSCOPIC (CUSTOM PROCEDURE TRAY) ×3 IMPLANT
TROCAR BLADELESS OPT 5 100 (ENDOMECHANICALS) ×3 IMPLANT
UNDERPAD 30X30 INCONTINENT (UNDERPADS AND DIAPERS) ×3 IMPLANT
WATER STERILE IRR 1500ML POUR (IV SOLUTION) ×4 IMPLANT

## 2016-05-06 NOTE — Transfer of Care (Signed)
Immediate Anesthesia Transfer of Care Note  Patient: Kayla Guerrero  Procedure(s) Performed: Procedure(s): XI ROBOTIC ASSISTED  LAPAROSCOPIC TOTAL HYSTERECTOMY WITH BILATERAL SALPINGO OOPHORECTOMY (N/A) SENTINEL NODE BIOPSY (N/A)  Patient Location: PACU  Anesthesia Type:General  Level of Consciousness:  sedated, patient cooperative and responds to stimulation  Airway & Oxygen Therapy:Patient Spontanous Breathing and Patient connected to face mask oxgen  Post-op Assessment:  Report given to PACU RN and Post -op Vital signs reviewed and stable  Post vital signs:  Reviewed and stable  Last Vitals:  Vitals:   05/06/16 0531 05/06/16 1011  BP: 127/77 (!) 173/82  Pulse: 84   Resp: 16 12  Temp: 123456 C     Complications: No apparent anesthesia complications

## 2016-05-06 NOTE — Anesthesia Preprocedure Evaluation (Signed)
Anesthesia Evaluation  Patient identified by MRN, date of birth, ID band Patient awake    Reviewed: Allergy & Precautions, NPO status , Patient's Chart, lab work & pertinent test results  Airway Mallampati: II  TM Distance: >3 FB Neck ROM: Full    Dental no notable dental hx.    Pulmonary neg pulmonary ROS, former smoker,    Pulmonary exam normal breath sounds clear to auscultation       Cardiovascular negative cardio ROS Normal cardiovascular exam Rhythm:Regular Rate:Normal     Neuro/Psych negative neurological ROS  negative psych ROS   GI/Hepatic negative GI ROS, Neg liver ROS,   Endo/Other  Hypothyroidism   Renal/GU negative Renal ROS  negative genitourinary   Musculoskeletal negative musculoskeletal ROS (+)   Abdominal   Peds negative pediatric ROS (+)  Hematology negative hematology ROS (+)   Anesthesia Other Findings   Reproductive/Obstetrics negative OB ROS                             Anesthesia Physical Anesthesia Plan  ASA: II  Anesthesia Plan: General   Post-op Pain Management:    Induction: Intravenous  Airway Management Planned: Oral ETT  Additional Equipment:   Intra-op Plan:   Post-operative Plan: Extubation in OR  Informed Consent: I have reviewed the patients History and Physical, chart, labs and discussed the procedure including the risks, benefits and alternatives for the proposed anesthesia with the patient or authorized representative who has indicated his/her understanding and acceptance.   Dental advisory given  Plan Discussed with: CRNA and Surgeon  Anesthesia Plan Comments:         Anesthesia Quick Evaluation

## 2016-05-06 NOTE — Addendum Note (Signed)
Addendum  created 05/06/16 1110 by Talbot Grumbling, CRNA   Anesthesia Intra Flowsheets edited

## 2016-05-06 NOTE — H&P (View-Only) (Signed)
Consult Note: Gyn-Onc  Consult was requested by Dr. Nori Riis for the evaluation of Kayla Guerrero 81 y.o. female  CC:  Chief Complaint  Patient presents with  . endometrial cancer    New Consultation    Assessment/Plan:  Kayla Guerrero  is a 81 y.o.  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   A detailed discussion was held with the patient and her family with regard to to her endometrial cancer diagnosis. We discussed the standard management options for uterine cancer which includes surgery followed possibly by adjuvant therapy depending on the results of surgery. The options for surgical management include a hysterectomy and removal of the tubes and ovaries possibly with removal of pelvic and para-aortic lymph nodes or SLN biopsy. A minimally invasive approach including a robotic hysterectomy or laparoscopic hysterectomy have benefits including shorter hospital stay, recovery time and better wound healing. The alternative approach is an open hysterectomy. The patient has been counseled about these surgical options and the risks of surgery in general including infection, bleeding, damage to surrounding structures (including bowel, bladder, ureters, nerves or vessels), and the postoperative risks of PE/ DVT, and lymphedema. I extensively reviewed the additional risks of robotic hysterectomy including possible need for conversion to open laparotomy.  I discussed positioning during surgery of trendelenberg and risks of minor facial swelling and care we take in preoperative positioning.  After counseling and consideration of her options, she desires to proceed with robotic hysterectomy, BSO, SLN biopsy.   She will be seen by anesthesia for preoperative clearance and discussion of postoperative pain management.  She was given the opportunity to ask questions, which were answered to her satisfaction, and she is agreement with the above mentioned plan of care.  I discussed  that high grade serous cancers are associated with increased risk for distant metastases and for recurrence, therefore we will order preoperative CT imaging for staging purposes and I informed Kayla Guerrero that she is at a high probability for requiring adjuvant therapy with chemotherapy and or radiation. This will be definitively determined based on the final results from staging surgery.  HPI: Kayla Guerrero is an extremely pleasant 81 year old G2P2 who is seen in consultation at the request of Dr Nori Riis for high grade (serous) endometrial cancer.  The patient reports postmenopausal spotting since July, 2017. She was seen by Dr Nori Riis for this who performed a TVUS on 04/08/16 which revealed a 7.3x4x5.2cm uterus with a 3.3cm irregular mass within the endometrium. The ovaries were normal.   A hysteroscopy and D&C was performed on 04/09/16 with several polypoid structures seen and removed. 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   She is otherwise very healthy with some thyroid disease. She is of advanced age, but appears younger than stated age. She is extremely vital and independently mobile. She has had laparoscopic tubal and cholecystectomy but no other abdominal surgeries. She is overweight with a BMI of 29kg/m2. She has had 2 prior vaginal deliveries.  Of note, she has a family history a mother with rectal cancer, a maternal grandmother with gynecologic cancer (possibly uterine), and a maternal aunt with a history of breast cancer.  Current Meds:  Outpatient Encounter Prescriptions as of 04/23/2016  Medication Sig  . calcium carbonate (OS-CAL) 600 MG TABS Take 600 mg by mouth daily.    . Cholecalciferol (VITAMIN D) 2000 UNITS tablet Take 2,000 Units by mouth daily.  . fish oil-omega-3 fatty acids 1000 MG  capsule Take 1 g by mouth daily.   [name] glucosamine-chondroitin 500-400 MG tablet Take 1 tablet by mouth 2 (two) times daily.   [name] levothyroxine  (SYNTHROID, LEVOTHROID) 88 MCG tablet TAKE ONE TABLET BY MOUTH ONCE DAILY BEFORE BREAKFAST  . Magnesium 400 MG CAPS Take 1 capsule by mouth daily.    . Multiple Vitamin (MULTI-VITAMIN) tablet Take 1 tablet by mouth daily.    . Red Yeast Rice Extract (RED YEAST RICE PO) Take 1 tablet by mouth 3 (three) times daily.   . [DISCONTINUED] calcium carbonate (TUMS - DOSED IN MG ELEMENTAL CALCIUM) 500 MG chewable tablet Chew 1-2 tablets by mouth as needed for indigestion or heartburn. Reported on 04/02/2016  . [DISCONTINUED] naproxen sodium (ANAPROX) 220 MG tablet Take 220 mg by mouth as needed. Reported on 04/02/2016  . [DISCONTINUED] terbinafine (LAMISIL) 250 MG tablet Take 1 tablet (250 mg total) by mouth daily.   Facility-Administered Encounter Medications as of 04/23/2016  Medication  . influenza  inactive virus vaccine (FLUZONE/FLUARIX) injection 0.5 mL    Allergy:  Allergies  Allergen Reactions  . Levaquin [Levofloxacin Hemihydrate] Other (See Comments)  . Promethazine Hcl Other (See Comments)    fainting  . Sulfa Drugs Cross Reactors Swelling    Social Hx:   Social History   Social History  . Marital status: Married    Spouse name: N/A  . Number of children: 1  . Years of education: N/A   Occupational History  . retired Librarian, academic); Artist    Social History Main Topics  . Smoking status: Former Smoker    Quit date: 09/29/1980  . Smokeless tobacco: Never Used  . Alcohol use 0.0 oz/week     Comment: 1 glass of wine 3-4 times/week; hard liquor (highball) or a beer once a week  . Drug use: No  . Sexual activity: Not on file   Other Topics Concern  . Not on file   Social History Narrative   Lives with husband.  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 Son lives in [place]; granddaughter lives in [place] (with her mother--son is divorced)    Past Surgical Hx:  Past  Surgical History:  Procedure Laterality Date  . CATARACT EXTRACTION, BILATERAL    . CHOLECYSTECTOMY    . COLONOSCOPY  2010, 04/2013   Buccini  . PTERYGIUM EXCISION    . TUBAL LIGATION      Past Medical Hx:  Past Medical History:  Diagnosis Date  . Arthritis of knee, left   . Borderline osteopenia DEXA 2006 and 2012  . Cataract   . Cholesterol serum elevated   . Colon polyps   . FHx: colon cancer   . GERD (gastroesophageal reflux disease)   . Obesity     Past Gynecological History:  Tubal ligation, SVD x 2. No LMP recorded. Patient is postmenopausal.  Family Hx:  Family History  Problem Relation Age of Onset  . Cancer Mother 96    COLON   . Heart disease Mother   . Cancer Maternal Aunt   . Breast cancer Maternal Aunt   . Mental illness Maternal Uncle   . Cancer Maternal Grandmother     stomach  . Macular degeneration Maternal Grandmother   . Diabetes Neg Hx     Review of Systems:  Constitutional  Feels well,    ENT Normal appearing ears and nares bilaterally Skin/Breast  No rash,  sores, jaundice, itching, dryness Cardiovascular  No chest pain, shortness of breath, or edema  Pulmonary  No cough or wheeze.  Gastro Intestinal  No nausea, vomitting, or diarrhoea. No bright red blood per rectum, no abdominal pain, change in bowel movement, or constipation.  Genito Urinary  No frequency, urgency, dysuria, + postmenopausal bleeding. Musculo Skeletal  No myalgia, arthralgia, joint swelling or pain  Neurologic  No weakness, numbness, change in gait,  Psychology  No depression, anxiety, insomnia.   Vitals:  Blood pressure (!) 148/83, pulse 80, temperature 97.7 F (36.5 C), temperature source Oral, resp. rate 18, weight 180 lb 4.8 oz (81.8 kg), SpO2 99 %.  Physical Exam: WD in NAD Neck  Supple NROM, without any enlargements.  Lymph Node Survey No cervical supraclavicular or inguinal adenopathy Cardiovascular  Pulse normal rate, regularity and rhythm. S1 and  S2 normal.  Lungs  Clear to auscultation bilateraly, without wheezes/crackles/rhonchi. Good air movement.  Skin  No rash/lesions/breakdown  Psychiatry  Alert and oriented to person, place, and time  Abdomen  Normoactive bowel sounds, abdomen soft, non-tender and overweight but nonobese without evidence of hernia.  Back No CVA tenderness Genito Urinary  Vulva/vagina: Normal external female genitalia.   No lesions. No discharge or bleeding.  Bladder/urethra:  No lesions or masses, well supported bladder  Vagina: normal, narrow introitus  Cervix: Normal appearing, no lesions.  Uterus:  Small, mobile, no parametrial involvement or nodularity.  Adnexa: no palpable masses. Rectal  deferred Extremities  No bilateral cyanosis, clubbing or edema.   Donaciano Eva, MD  04/23/2016, 12:13 PM

## 2016-05-06 NOTE — Anesthesia Postprocedure Evaluation (Signed)
Anesthesia Post Note  Patient: Kayla Guerrero  Procedure(s) Performed: Procedure(s) (LRB): XI ROBOTIC ASSISTED  LAPAROSCOPIC TOTAL HYSTERECTOMY WITH BILATERAL SALPINGO OOPHORECTOMY (N/A) SENTINEL NODE BIOPSY (N/A)  Patient location during evaluation: PACU Anesthesia Type: General Level of consciousness: awake and alert Pain management: pain level controlled Vital Signs Assessment: post-procedure vital signs reviewed and stable Respiratory status: spontaneous breathing, nonlabored ventilation, respiratory function stable and patient connected to nasal cannula oxygen Cardiovascular status: blood pressure returned to baseline and stable Postop Assessment: no signs of nausea or vomiting Anesthetic complications: no    Last Vitals:  Vitals:   05/06/16 1015 05/06/16 1030  BP: (!) 178/83 (!) 149/83  Pulse: 73 70  Resp: 15 18  Temp:      Last Pain:  Vitals:   05/06/16 1030  TempSrc:   PainSc: 8                  Kayla Guerrero S

## 2016-05-06 NOTE — Op Note (Signed)
PATIENT: Kayla Guerrero DATE OF BIRTH: 1935-03-04 ENCOUNTER DATE: 05/06/16   Preop Diagnosis: grade 3 endometrioid adenocarcinoma.   Postoperative Diagnosis: same.   Surgery: Total robotic hysterectomy, bilateral salpingo-oophorectomy, bilateral pelvic and para-aortic lymph node dissection. Failed SLN mapping.  Surgeons:  Lucita Lora. Alycia Rossetti, MD; Lahoma Crocker, MD   Anesthesia: General   Estimated blood loss: 25 ml   IVF: 1500  ml   Urine 0000000  ml   Complications: None   Pathology: Uterus, cervix, bilateral tubes and ovaries, bilateral pelvic and para-aortic lymph nodes to pathology.   Operative findings: Normal cervix, normal appearing uterus and adnexa. Adhesions of the colon to the left pelvic sidewall. Omentum adherent to LUQ. No SLN mapping. No enlarged lymph nodes  Procedure: The patient was identified in the preoperative holding area. Informed consent was signed on the chart. Patient was seen history was reviewed and exam was performed.   The patient was then taken to the operating room and placed in the supine position with SCD hose on. She was then placed in the dorsolithotomy position. Her arms were tucked at her side with appropriate precautions on the gel pad. General anesthesia was then induced without difficulty. Shoulder blocks were then placed in the usual fashion with appropriate precautions. A OG-tube was placed to suction. First timeout was performed to confirm the patient, procedure, antibiotic, allergy status, estimated blood loss and OR time. The perineum was then prepped in the usual fashion with Betadine. A 14 French Foley was inserted into the bladder under sterile conditions. A sterile speculum was placed in the vagina. The cervix was without lesions. The cervix was grasped with a single-tooth tenaculum. ICG was injected at 3:00 and 9:00 per FIRES protocol The cervix was dilated without difficulty. A ZUMI with a medium Koe ring was placed without  difficulty. The abdomen was then prepped with 1 Chlor prep sponges per protocol.   Patient was then draped after the prep was dried. Second timeout was performed to confirm the above. After again confirming OG tube placement and it was to suction. A stab-wound was made in left upper quadrant 2 cm below the costal margin on the left in the midclavicular line. A 5 mm operative report was used to assure intra-abdominal placement. The abdomen was insufflated. At this point all points during the procedure the patient's intra-abdominal pressure was not increased over 15 mm of mercury. After insufflation was complete, the patient was placed in deep Trendelenburg position. 25 cm above the pubic symphysis that area was marked the camera port. Bilateral robotic ports were marked 10 cm from the midline incision and the 4th arm was marked at a 10 degree angle and 10 cm from the left lateral port. Under direct visualization each of the trochars was placed into the abdomen. The small bowel was folded on its mesentery to allow visualization to the pelvis. The 5 mm LUQ port was then converted to a 10/12 port under direct visualization.  After assuring adequate visualization, the robot was then docked in the usual fashion. Under direct visualization the robotic instruments replaced.   The round ligament on the patient's right side was transected with monopolar cautery. The anterior and posterior leaves of the broad ligament were then taken down in the usual fashion. The ureter was identified on the medial leaf of the broad ligament. A window was made between the IP and the ureter. The perivesical and para-rectal spaces were opened. The obturator nerve was identified. There were lymphatic tracks noted, but  no SLN was identified. The same procedure was performed on the left side. Similarly no SLN was identified. The peritoneum over the right common and along the aorta was opened. The ureter was identified and deviated laterally  with the fourth arm. No lymphatic mapping was seen. The nodal bundle overlying the aorta to just above the IMA and to the bifurcation was dissected off. The nodes were placed in an endocatch bag. The left sided peritoneum was opened and the ureter on the left was identified and held laterally by the assistant. The left PA nodes were dissected from just below the IMA to the bifurcation. These were placed in an endocatch bag.  The pelvic lymph nodes were dissected with the boundaries of the genitofemoral nerve, circumflex iliac, superior vesicle, obturator nerve to the level of the common iliac artery. The nodal bundle was placed in an endocatch bag. The same procedure was performed on the left side. All nodal bundles were kept in the abdomen. The IP on the right was coagulated with bipolar cautery and transected. The posterior leaf of the broad ligament was taken down to the level of the KOH ring. The bladder flap was created using meticulous dissection and pinpoint cautery. The uterine vessels were coagulated with bipolar cautery. The uterine vessels were then transected and the C loop was created. The same procedure was performed on the patient's left side.   The pneumo-occulder in the vagina was then insufflated. The colpotomy was then created in the usual fashion. All specimens were delivered to the vagina.The vaginal cuff was closed with a running 0 Vicryl on CT 1 suture. The robotic instruments were removed under direct visualization as were the robotic trochars. The pneumoperitoneum was removed. The patient was then taken out of the Trendelenburg position. Using of 0 Vicryl on a UR 6 needle the subcutaneous tissues of the port in the left upper quadrant was reapproximated. The skin was closed using 4-0 Vicryl. Steri-Strips and benzoin were applied. The pneumo occluded balloon was removed from the vagina. The vagina was swabbed and noted to be hemostatic.   All instrument needle and Ray-Tec counts were  correct x2. The patient tolerated the procedure well and was taken to the recovery room in stable condition. This is Nancy Marus dictating an operative note on patient Kayla Guerrero.

## 2016-05-06 NOTE — Interval H&P Note (Signed)
History and Physical Interval Note:  05/06/2016 7:25 AM  Kayla Guerrero  has presented today for surgery, with the diagnosis of ENDOMETRIAL CANCER  The various methods of treatment have been discussed with the patient and family. After consideration of risks, benefits and other options for treatment, the patient has consented to  Procedure(s): XI ROBOTIC [place] (N/A) SENTINEL NODE BIOPSY (N/A) as a surgical intervention .  The patient's history has been reviewed, patient examined, no change in status, stable for surgery.  I have reviewed the patient's chart and labs.  Questions were answered to the patient's satisfaction.     [place] A.

## 2016-05-06 NOTE — Anesthesia Procedure Notes (Signed)
Procedure Name: Intubation Date/Time: 05/06/2016 7:41 AM Performed by: Noralyn Pick D Pre-anesthesia Checklist: Patient identified, Emergency Drugs available, Suction available and Patient being monitored Patient Re-evaluated:Patient Re-evaluated prior to inductionOxygen Delivery Method: Circle system utilized Preoxygenation: Pre-oxygenation with 100% oxygen Intubation Type: IV induction Ventilation: Mask ventilation without difficulty Laryngoscope Size: Miller and 2 Grade View: Grade II Tube type: Oral Tube size: 7.5 mm Number of attempts: 1 Airway Equipment and Method: Stylet Placement Confirmation: ETT inserted through vocal cords under direct vision,  positive ETCO2 and breath sounds checked- equal and bilateral Secured at: 21 cm Tube secured with: Tape Dental Injury: Teeth and Oropharynx as per pre-operative assessment

## 2016-05-07 DIAGNOSIS — C541 Malignant neoplasm of endometrium: Secondary | ICD-10-CM | POA: Diagnosis not present

## 2016-05-07 LAB — BASIC METABOLIC PANEL
ANION GAP: 3 — AB (ref 5–15)
BUN: 12 mg/dL (ref 6–20)
CALCIUM: 8 mg/dL — AB (ref 8.9–10.3)
CHLORIDE: 107 mmol/L (ref 101–111)
CO2: 27 mmol/L (ref 22–32)
Creatinine, Ser: 0.9 mg/dL (ref 0.44–1.00)
GFR calc non Af Amer: 59 mL/min — ABNORMAL LOW (ref >60)
GLUCOSE: 120 mg/dL — AB (ref 65–99)
Potassium: 4.7 mmol/L (ref 3.5–5.1)
Sodium: 137 mmol/L (ref 135–145)

## 2016-05-07 LAB — CBC
HEMATOCRIT: 34.7 % — AB (ref 36.0–46.0)
Hemoglobin: 11.5 g/dL — ABNORMAL LOW (ref 12.0–15.0)
MCH: 29.9 pg (ref 26.0–34.0)
MCHC: 33.1 g/dL (ref 30.0–36.0)
MCV: 90.1 fL (ref 78.0–100.0)
Platelets: 217 10*3/uL (ref 150–400)
RBC: 3.85 MIL/uL — ABNORMAL LOW (ref 3.87–5.11)
RDW: 14 % (ref 11.5–15.5)
WBC: 7.4 10*3/uL (ref 4.0–10.5)

## 2016-05-07 MED ORDER — OXYCODONE-ACETAMINOPHEN 5-325 MG PO TABS: 1.0000 | ORAL_TABLET | ORAL | 0 refills | Status: DC | PRN

## 2016-05-07 NOTE — Progress Notes (Signed)
Pt discharged home with spouse in stable condition.  Discharge instructions and script given. Pt verbalized understanding. No immediate questions or concerns at this time

## 2016-05-07 NOTE — Discharge Instructions (Signed)
05/07/2016  Return to work: 4-6 weeks if applicable  Activity: 1. Be up and out of the bed during the day.  Take a nap if needed.  You may walk up steps but be careful and use the hand rail.  Stair climbing will tire you more than you think, you may need to stop part way and rest.   2. No lifting or straining for 6 weeks.  3. No driving for 1 week(s).  Do not drive if you are taking narcotic pain medicine.  4. Shower daily.  Use soap and water on your incision and pat dry; don't rub.  No tub baths until cleared by your surgeon.   5. No sexual activity and nothing in the vagina for 8 weeks.  6. You may experience a small amount of clear drainage from your incisions, which is normal.  If the drainage persists or increases, please call the office.   Diet: 1. Low sodium Heart Healthy Diet is recommended.  2. It is safe to use a laxative, such as Miralax or Colace, if you have difficulty moving your bowels.   Wound Care: 1. Keep clean and dry.  Shower daily.  Reasons to call the Doctor:  Fever - Oral temperature greater than 100.4 degrees Fahrenheit  Foul-smelling vaginal discharge  Difficulty urinating  Nausea and vomiting  Increased pain at the site of the incision that is unrelieved with pain medicine.  Difficulty breathing with or without chest pain  New calf pain especially if only on one side  Sudden, continuing increased vaginal bleeding with or without clots.   Contacts: For questions or concerns you should contact:  Dr. Everitt Amber at [phone]  Joylene John, NP at [phone]  After Hours: call [phone] and have the GYN Oncologist paged/contacted  Acetaminophen; Oxycodone tablets What is this medicine? ACETAMINOPHEN; OXYCODONE (a set a MEE noe fen; ox i KOE done) is a pain reliever. It is used to treat moderate to severe pain. This medicine may be used for other purposes; ask your health care provider or pharmacist if you have questions. What should  I tell my health care provider before I take this medicine? They need to know if you have any of these conditions: -brain tumor -Crohn's disease, inflammatory bowel disease, or ulcerative colitis -drug abuse or addiction -head injury -heart or circulation problems -if you often drink alcohol -kidney disease or problems going to the bathroom -liver disease -lung disease, asthma, or breathing problems -an unusual or allergic reaction to acetaminophen, oxycodone, other opioid analgesics, other medicines, foods, dyes, or preservatives -pregnant or trying to get pregnant -breast-feeding How should I use this medicine? Take this medicine by mouth with a full glass of water. Follow the directions on the prescription label. You can take it with or without food. If it upsets your stomach, take it with food. Take your medicine at regular intervals. Do not take it more often than directed. Talk to your pediatrician regarding the use of this medicine in children. Special care may be needed. Patients over 76 years old may have a stronger reaction and need a smaller dose. Overdosage: If you think you have taken too much of this medicine contact a poison control center or emergency room at once. NOTE: This medicine is only for you. Do not share this medicine with others. What if I miss a dose? If you miss a dose, take it as soon as you can. If it is almost time for your next dose, take only that dose.  Do not take double or extra doses. What may interact with this medicine? -alcohol -antihistamines -barbiturates like amobarbital, butalbital, butabarbital, methohexital, pentobarbital, phenobarbital, thiopental, and secobarbital -benztropine -drugs for bladder problems like solifenacin, trospium, oxybutynin, tolterodine, hyoscyamine, and methscopolamine -drugs for breathing problems like ipratropium and tiotropium -drugs for certain stomach or intestine problems like propantheline, homatropine  methylbromide, glycopyrrolate, atropine, belladonna, and dicyclomine -general anesthetics like etomidate, ketamine, nitrous oxide, propofol, desflurane, enflurane, halothane, isoflurane, and sevoflurane -medicines for depression, anxiety, or psychotic disturbances -medicines for sleep -muscle relaxants -naltrexone -narcotic medicines (opiates) for pain -phenothiazines like perphenazine, thioridazine, chlorpromazine, mesoridazine, fluphenazine, prochlorperazine, promazine, and trifluoperazine -scopolamine -tramadol -trihexyphenidyl This list may not describe all possible interactions. Give your health care provider a list of all the medicines, herbs, non-prescription drugs, or dietary supplements you use. Also tell them if you smoke, drink alcohol, or use illegal drugs. Some items may interact with your medicine. What should I watch for while using this medicine? Tell your doctor or health care professional if your pain does not go away, if it gets worse, or if you have new or a different type of pain. You may develop tolerance to the medicine. Tolerance means that you will need a higher dose of the medication for pain relief. Tolerance is normal and is expected if you take this medicine for a long time. Do not suddenly stop taking your medicine because you may develop a severe reaction. Your body becomes used to the medicine. This does NOT mean you are addicted. Addiction is a behavior related to getting and using a drug for a non-medical reason. If you have pain, you have a medical reason to take pain medicine. Your doctor will tell you how much medicine to take. If your doctor wants you to stop the medicine, the dose will be slowly lowered over time to avoid any side effects. You may get drowsy or dizzy. Do not drive, use machinery, or do anything that needs mental alertness until you know how this medicine affects you. Do not stand or sit up quickly, especially if you are an older patient. This  reduces the risk of dizzy or fainting spells. Alcohol may interfere with the effect of this medicine. Avoid alcoholic drinks. There are different types of narcotic medicines (opiates) for pain. If you take more than one type at the same time, you may have more side effects. Give your health care provider a list of all medicines you use. Your doctor will tell you how much medicine to take. Do not take more medicine than directed. Call emergency for help if you have problems breathing. The medicine will cause constipation. Try to have a bowel movement at least every 2 to 3 days. If you do not have a bowel movement for 3 days, call your doctor or health care professional. Do not take Tylenol (acetaminophen) or medicines that have acetaminophen with this medicine. Too much acetaminophen can be very dangerous. Many nonprescription medicines contain acetaminophen. Always read the labels carefully to avoid taking more acetaminophen. What side effects may I notice from receiving this medicine? Side effects that you should report to your doctor or health care professional as soon as possible: -allergic reactions like skin rash, itching or hives, swelling of the face, lips, or tongue -breathing difficulties, wheezing -confusion -light headedness or fainting spells -severe stomach pain -unusually weak or tired -yellowing of the skin or the whites of the eyes Side effects that usually do not require medical attention (report to your doctor or health  care professional if they continue or are bothersome): -dizziness -drowsiness -nausea -vomiting This list may not describe all possible side effects. Call your doctor for medical advice about side effects. You may report side effects to FDA at 1-800-FDA-1088. Where should I keep my medicine? Keep out of the reach of children. This medicine can be abused. Keep your medicine in a safe place to protect it from theft. Do not share this medicine with anyone. Selling  or giving away this medicine is dangerous and against the law. This medicine may cause accidental overdose and death if it taken by other adults, children, or pets. Mix any unused medicine with a substance like cat litter or coffee grounds. Then throw the medicine away in a sealed container like a sealed bag or a coffee can with a lid. Do not use the medicine after the expiration date. Store at room temperature between 20 and 25 degrees C (68 and 77 degrees F). NOTE: This sheet is a summary. It may not cover all possible information. If you have questions about this medicine, talk to your doctor, pharmacist, or health care provider.    2016, Elsevier/Gold Standard. (2014-08-16 15:18:46)

## 2016-05-07 NOTE — Discharge Summary (Signed)
Physician Discharge Summary  Patient ID: Kayla Guerrero MRN: [id number] DOB/AGE: June 27, 1935 81 y.o.  Admit date: 05/06/2016 Discharge date: 05/07/2016  Admission Diagnoses: Endometrial cancer [facility])  Discharge Diagnoses:  Principal Problem:   Endometrial cancer [facility])   Discharged Condition:  The patient is in good condition and stable for discharge.    Hospital Course: On 05/06/2016, the patient underwent the following: Procedure(s): XI ROBOTIC ASSISTED  LAPAROSCOPIC TOTAL HYSTERECTOMY WITH BILATERAL SALPINGO OOPHORECTOMY SENTINEL NODE BIOPSY.  The postoperative course was uneventful.  She was discharged to home on postoperative day 1 tolerating a regular diet, voiding, ambulating, minimal pain.  Consults: None  Significant Diagnostic Studies: None  Treatments: surgery: see above  Discharge Exam: Blood pressure (!) 104/40, pulse 66, temperature 98.3 F (36.8 C), temperature source Oral, resp. rate 16, height 5' 6.25" (1.683 m), weight 180 lb 4 oz (81.8 kg), SpO2 100 %. General appearance: alert, cooperative, appears stated age and no distress Resp: clear to auscultation bilaterally Cardio: regular rate and rhythm, S1, S2 normal, no murmur, click, rub or gallop GI: soft, non-tender; bowel sounds normal; no masses,  no organomegaly Extremities: extremities normal, atraumatic, no cyanosis or edema Incision/Wound: Lap sites to the abdomen, dressings removed, steri strips in place, no active drainage, bleeding, or erythema.  Disposition: Home  Discharge Instructions    Call MD for:  difficulty breathing, headache or visual disturbances    Complete by:  As directed   Call MD for:  extreme fatigue    Complete by:  As directed   Call MD for:  hives    Complete by:  As directed   Call MD for:  persistant dizziness or light-headedness    Complete by:  As directed   Call MD for:  persistant nausea and vomiting    Complete by:  As directed   Call MD for:  redness, tenderness, or signs of  infection (pain, swelling, redness, odor or green/yellow discharge around incision site)    Complete by:  As directed   Call MD for:  severe uncontrolled pain    Complete by:  As directed   Call MD for:  temperature >100.4    Complete by:  As directed   Diet - low sodium heart healthy    Complete by:  As directed   Driving Restrictions    Complete by:  As directed   No driving for 1 week.  Do not take narcotics and drive.   Increase activity slowly    Complete by:  As directed   Lifting restrictions    Complete by:  As directed   No lifting greater than 10 lbs.   Sexual Activity Restrictions    Complete by:  As directed   No sexual activity, nothing in the vagina, for 8 weeks.       Medication List    TAKE these medications   calcium carbonate 600 MG Tabs tablet Commonly known as:  OS-CAL Take 600 mg by mouth daily.   fish oil-omega-3 fatty acids 1000 MG capsule Take 1 g by mouth daily.   glucosamine-chondroitin 500-400 MG tablet Take 1 tablet by mouth 2 (two) times daily.   levothyroxine 88 MCG tablet Commonly known as:  SYNTHROID, LEVOTHROID TAKE ONE TABLET BY MOUTH ONCE DAILY BEFORE BREAKFAST   Magnesium 400 MG Caps Take 1 capsule by mouth daily.   MULTI-VITAMIN tablet Take 1 tablet by mouth daily.   oxyCODONE-acetaminophen 5-325 MG tablet Commonly known as:  PERCOCET/ROXICET Take 1-2 tablets by mouth every  4 (four) hours as needed (moderate to severe pain).   RED YEAST RICE PO Take 1 tablet by mouth 3 (three) times daily.   Vitamin D 2000 units tablet Take 2,000 Units by mouth daily.      Follow-up Information    GEHRIG,PAOLA A., MD Follow up on 05/28/2016.   Specialty:  Gynecologic Oncology Why:  at 11:45am at the [facility] for post-operative check. Contact information: [address]. [place] [address] [phone]           Greater than thirty minutes were spend for face to face discharge instructions and discharge orders/summary in EPIC.    Signed: CROSS, MELISSA DEAL 05/07/2016, 12:50 PM

## 2016-05-13 ENCOUNTER — Telehealth: Payer: Self-pay | Admitting: Gynecologic Oncology

## 2016-05-13 NOTE — Telephone Encounter (Signed)
Spoke with the patient about final path results and Dr. Elenora Gamma recommendations for 6 cycles of chemotherapy (carb/tax) and vaginal cuff brachytherapy.  All questions answered.  Appt moved up to August 23 to discuss.

## 2016-05-15 ENCOUNTER — Ambulatory Visit (INDEPENDENT_AMBULATORY_CARE_PROVIDER_SITE_OTHER): Payer: Medicare Other | Admitting: Podiatry

## 2016-05-15 ENCOUNTER — Encounter: Payer: Self-pay | Admitting: Podiatry

## 2016-05-15 DIAGNOSIS — L603 Nail dystrophy: Secondary | ICD-10-CM

## 2016-05-15 MED ORDER — TERBINAFINE HCL 250 MG PO TABS: 250.0000 mg | ORAL_TABLET | Freq: Every day | ORAL | 0 refills | Status: DC

## 2016-05-15 NOTE — Progress Notes (Signed)
She presents today for completion of her Lamisil therapy. She states that it looks much better however she has endometrial cancer and is going to have to start chemotherapy.  Objective: Vital signs are stable she is alert and oriented 3. Nail plates appear to approximately 50% clear.  Assessment: Resolving onychomycosis.  Plan: I recommended an every other day dose for the next 2 months. I did recommend that she discuss this with her oncologist prior starting chemotherapy and allow the oncologist to make sure that there is no interactions. Otherwise we will restart therapy after chemotherapy. We did refill the prescription today for 30 tablets one every other day for the next 2 months and I will follow-up with her in 3 months.

## 2016-05-16 ENCOUNTER — Ambulatory Visit (HOSPITAL_COMMUNITY)
Admission: RE | Admit: 2016-05-16 | Discharge: 2016-05-16 | Disposition: A | Discharge status: Home / Self Care | Payer: Medicare Other | Source: Ambulatory Visit | Attending: Gynecologic Oncology | Admitting: Gynecologic Oncology

## 2016-05-16 ENCOUNTER — Ambulatory Visit: Payer: Medicare Other | Attending: Gynecologic Oncology | Admitting: Gynecologic Oncology

## 2016-05-16 ENCOUNTER — Telehealth: Payer: Self-pay | Admitting: Gynecologic Oncology

## 2016-05-16 VITALS — BP 172/94 | HR 76 | Temp 98.5°F | Resp 18 | Wt 180.4 lb

## 2016-05-16 DIAGNOSIS — N289 Disorder of kidney and ureter, unspecified: Secondary | ICD-10-CM | POA: Insufficient documentation

## 2016-05-16 DIAGNOSIS — R1030 Lower abdominal pain, unspecified: Secondary | ICD-10-CM | POA: Diagnosis not present

## 2016-05-16 DIAGNOSIS — R1032 Left lower quadrant pain: Secondary | ICD-10-CM

## 2016-05-16 DIAGNOSIS — Z888 Allergy status to other drugs, medicaments and biological substances status: Secondary | ICD-10-CM | POA: Insufficient documentation

## 2016-05-16 DIAGNOSIS — Z9841 Cataract extraction status, right eye: Secondary | ICD-10-CM | POA: Diagnosis not present

## 2016-05-16 DIAGNOSIS — G8918 Other acute postprocedural pain: Secondary | ICD-10-CM | POA: Diagnosis not present

## 2016-05-16 DIAGNOSIS — E669 Obesity, unspecified: Secondary | ICD-10-CM | POA: Diagnosis not present

## 2016-05-16 DIAGNOSIS — Z86010 Personal history of colonic polyps: Secondary | ICD-10-CM | POA: Insufficient documentation

## 2016-05-16 DIAGNOSIS — Z9842 Cataract extraction status, left eye: Secondary | ICD-10-CM | POA: Diagnosis not present

## 2016-05-16 DIAGNOSIS — C541 Malignant neoplasm of endometrium: Secondary | ICD-10-CM | POA: Insufficient documentation

## 2016-05-16 DIAGNOSIS — M810 Age-related osteoporosis without current pathological fracture: Secondary | ICD-10-CM | POA: Diagnosis not present

## 2016-05-16 DIAGNOSIS — Z882 Allergy status to sulfonamides status: Secondary | ICD-10-CM | POA: Insufficient documentation

## 2016-05-16 DIAGNOSIS — E039 Hypothyroidism, unspecified: Secondary | ICD-10-CM | POA: Diagnosis not present

## 2016-05-16 DIAGNOSIS — K449 Diaphragmatic hernia without obstruction or gangrene: Secondary | ICD-10-CM | POA: Diagnosis not present

## 2016-05-16 DIAGNOSIS — J982 Interstitial emphysema: Secondary | ICD-10-CM | POA: Diagnosis not present

## 2016-05-16 DIAGNOSIS — Z8249 Family history of ischemic heart disease and other diseases of the circulatory system: Secondary | ICD-10-CM | POA: Diagnosis not present

## 2016-05-16 DIAGNOSIS — K219 Gastro-esophageal reflux disease without esophagitis: Secondary | ICD-10-CM | POA: Diagnosis not present

## 2016-05-16 DIAGNOSIS — Z800 Family history of malignant neoplasm of digestive organs: Secondary | ICD-10-CM | POA: Diagnosis not present

## 2016-05-16 DIAGNOSIS — Z881 Allergy status to other antibiotic agents status: Secondary | ICD-10-CM | POA: Diagnosis not present

## 2016-05-16 DIAGNOSIS — Z9049 Acquired absence of other specified parts of digestive tract: Secondary | ICD-10-CM | POA: Diagnosis not present

## 2016-05-16 DIAGNOSIS — D181 Lymphangioma, any site: Secondary | ICD-10-CM | POA: Insufficient documentation

## 2016-05-16 DIAGNOSIS — Z90722 Acquired absence of ovaries, bilateral: Secondary | ICD-10-CM | POA: Insufficient documentation

## 2016-05-16 DIAGNOSIS — Z803 Family history of malignant neoplasm of breast: Secondary | ICD-10-CM | POA: Insufficient documentation

## 2016-05-16 DIAGNOSIS — Z87891 Personal history of nicotine dependence: Secondary | ICD-10-CM | POA: Insufficient documentation

## 2016-05-16 DIAGNOSIS — Z90710 Acquired absence of both cervix and uterus: Secondary | ICD-10-CM | POA: Diagnosis not present

## 2016-05-16 MED ORDER — GABAPENTIN 100 MG PO CAPS: 100.0000 mg | ORAL_CAPSULE | Freq: Three times a day (TID) | ORAL | Status: DC

## 2016-05-16 MED ORDER — GABAPENTIN 100 MG PO CAPS: 100.0000 mg | ORAL_CAPSULE | Freq: Three times a day (TID) | ORAL | 0 refills | Status: DC

## 2016-05-16 MED ORDER — IOPAMIDOL (ISOVUE-300) INJECTION 61%
100.0000 mL | Freq: Once | INTRAVENOUS | Status: AC | PRN
  Administered 2016-05-16: 100 mL via INTRAVENOUS

## 2016-05-16 MED ORDER — OXYCODONE-ACETAMINOPHEN 5-325 MG PO TABS
1.0000 | ORAL_TABLET | ORAL | 0 refills | Status: DC | PRN
Start: 2016-05-16 — End: 2016-05-28

## 2016-05-16 NOTE — Patient Instructions (Signed)
Plan to have CT scan of the abdomen and pelvis at 5pm at [facility].  If you decide not to proceed, please call [phone] so your scan can be cancelled.  Begin taking gabapentin 100 mg for nerve pain.  Plan to follow up next week, Aug 23, or sooner if needed.

## 2016-05-16 NOTE — Telephone Encounter (Signed)
Returned call to patient.  Patient had left message this am.  Patient reporting moderate pain that began 3 days ago and has been increasing.  The pain is located in her groin bilaterally and radiates down both legs.  She has been taking percocet for the pain with no relief.  She denies nausea, vomiting, abdominal bloating, swelling/redness in either leg, vaginal bleeding.  She reports her bowels and bladder are functioning without difficulty.  Advised that I would reach out to Dr. Alycia Rossetti for recommendations and that we could also get her a refill on her percocets since she states she is currently out.  Advised patient she would receive a return call.  Verbalizing understanding.  Advised to call for any needs between that time.

## 2016-05-16 NOTE — Assessment & Plan Note (Signed)
Plan for patient to have CT of the abdomen and pelvis to rule out lymphocyst/pelvic fluid collection.  Initiate neurontin per Dr. Alycia Rossetti.

## 2016-05-16 NOTE — Progress Notes (Signed)
Follow Up Note: Gyn-Onc  Kayla Guerrero 81 y.o. female  CC:  Chief Complaint  Patient presents with  . endometrial cancer    Pain to left groin & leg    HPI: Kayla Guerrero is a 81 year old female initially referred by Dr Nori Riis for high grade (serous) endometrial cancer.  She reported postmenopausal spotting since July 2017 and was seen by Dr Nori Riis who performed a TVUS on 04/08/16 which revealed a 7.3x4x5.2cm uterus with a 3.3cm irregular mass within the endometrium. The ovaries were normal. A hysteroscopy and D&C was performed on 04/09/16 with several polypoid structures seen and removed. 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   On 05/06/16, she had a total robotic hysterectomy, bilateral salpingo-oophorectomy, bilateral pelvic and para-aortic lymph node dissection, Failed SLN mapping with Dr. Nancy Marus.  Her post-operative course was uneventful.  Final pathology revealed: Diagnosis 1. Lymph node, biopsy, right peri-aortic - ONE OF ONE LYMPH NODES NEGATIVE FOR MALIGNANCY (0/1). 2. Lymph node, biopsy, left peri-aortic - ONE OF ONE LYMPH NODES NEGATIVE FOR MALIGNANCY (0/1). 3. Lymph node, biopsy, right pelvic - FOUR OF FOUR LYMPH NODES NEGATIVE FOR MALIGNANCY (0/4). 4. Lymph node, biopsy, left pelvic - FIVE OF FIVE LYMPH NODES NEGATIVE FOR MALIGNANCY (0/5). 5. Uterus +/- tubes/ovaries, neoplastic - UTERUS: -ENDO/MYOMETRIUM: INVASIVE MIXED ENDOMETRIOID AND SEROUS CARCINOMA, SPANNING 4 CM.  TUMOR INVADES OUTER HALF OF MYOMETRIUM. LYMPHOVASCULAR INVASION PRESENT. SEE ONCOLOGY TABLE. LEIOMYOMA. -SEROSA: UNINVOLVED. NO MALIGNANCY. - CERVIX: BENIGN SQUAMOUS AND ENDOCERVICAL MUCOSA. NO DYSPLASIA OR MALIGNANCY. - BILATERAL OVARIES: INCLUSION CYSTS. NO MALIGNANCY. - BILATERAL FALLOPIAN TUBES: UNREMARKABLE. NO MALIGNANCY.   Interval History:  She presents today with her husband for evaluation of moderate left groin pain that radiates into  her thigh.  She states the pain began 4 days ago and has gotten worse every day.  It is a constant pain and originally started affecting the bilateral groin area but has ended up more in the left groin leg.  She describes the pain as shooting and achy.  Denies issues with ambulation, swelling in either extremity.  Bowels and bladder functioning without difficulty.  No vaginal bleeding reported.  No drainage or erythema reported with her incisions.  She states that she had been taking percocet for the pain but it did not touch the pain.  Movement sometimes help alleviate the pain some.  Her pain is currently at a 7 and she is tearful.  No other concerns voiced.  Review of Systems Constitutional: Moderate pain experienced.  No fever, chills, change in appetite, unintentional weight loss or gain.  Cardiovascular: No chest pain, shortness of breath, or edema.  Pulmonary: No cough or wheeze.  Gastrointestinal: No nausea, vomiting, or diarrhea. No bright red blood per rectum or change in bowel movement.  Genitourinary: No frequency, urgency, or dysuria. No vaginal bleeding or discharge.  Musculoskeletal: Moderate left groin and left upper leg pain post-op.  No edema noted.   Neurologic: No weakness, numbness, or change in gait.  Psychology: No depression, anxiety, or insomnia  Current Meds:  Outpatient Encounter Prescriptions as of 05/16/2016  Medication Sig  . calcium carbonate (OS-CAL) 600 MG TABS Take 600 mg by mouth daily.    . Cholecalciferol (VITAMIN D) 2000 UNITS tablet Take 2,000 Units by mouth daily.  . fish oil-omega-3 fatty acids 1000 MG capsule Take 1 g by mouth daily.   [name] glucosamine-chondroitin 500-400 MG tablet Take 1 tablet by mouth 2 (two) times daily.   [name]  levothyroxine (SYNTHROID, LEVOTHROID) 88 MCG tablet TAKE ONE TABLET BY MOUTH ONCE DAILY BEFORE BREAKFAST  . Magnesium 400 MG CAPS Take 1 capsule by mouth daily.    . Multiple Vitamin (MULTI-VITAMIN) tablet Take 1 tablet by mouth  daily.    [name] oxyCODONE-acetaminophen (PERCOCET/ROXICET) 5-325 MG tablet Take 1-2 tablets by mouth every 4 (four) hours as needed (moderate to severe pain).  . Red Yeast Rice Extract (RED YEAST RICE PO) Take 1 tablet by mouth 3 (three) times daily.   [name] terbinafine (LAMISIL) 250 MG tablet Take 1 tablet (250 mg total) by mouth daily.  [name] gabapentin (NEURONTIN) 100 MG capsule Take 1 capsule (100 mg total) by mouth 3 (three) times daily.  . [DISCONTINUED] oxyCODONE-acetaminophen (PERCOCET/ROXICET) 5-325 MG tablet Take 1-2 tablets by mouth every 4 (four) hours as needed (moderate to severe pain).   Facility-Administered Encounter Medications as of 05/16/2016  Medication  . influenza  inactive virus vaccine (FLUZONE/FLUARIX) injection 0.5 mL  . [DISCONTINUED] gabapentin (NEURONTIN) capsule 100 mg    Allergy:  Allergies  Allergen Reactions  . Levaquin [Levofloxacin Hemihydrate] Other (See Comments)  . Promethazine Hcl Other (See Comments)    fainting  . Sulfa Drugs Kayla Guerrero Reactors Swelling    Social Hx:   Social History   Social History  . Marital status: Married    Spouse name: N/A  . Number of children: 1  . Years of education: N/A   Occupational History  . retired Librarian, academic); Artist    Social History Main Topics  . Smoking status: Former Smoker    Types: Cigarettes    Quit date: 09/29/1980  . Smokeless tobacco: Never Used  . Alcohol use 0.0 oz/week     Comment: 1 glass of wine 3-4 times/week; hard liquor (highball) or a beer once a week  . Drug use: No  . Sexual activity: Not on file   Other Topics Concern  . Not on file   Social History Narrative   Lives with husband.  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 Son lives in [place]; granddaughter lives in [place] (with her mother--son is divorced)    Past Surgical Hx:  Past Surgical History:  Procedure Laterality Date   . CATARACT EXTRACTION, BILATERAL    . CHOLECYSTECTOMY    . COLONOSCOPY  2010, 04/2013   Buccini  . PTERYGIUM EXCISION    . ROBOTIC ASSISTED TOTAL HYSTERECTOMY WITH BILATERAL SALPINGO OOPHERECTOMY N/A 05/06/2016   Procedure: XI ROBOTIC ASSISTED  LAPAROSCOPIC TOTAL HYSTERECTOMY WITH BILATERAL SALPINGO OOPHORECTOMY;  Surgeon: Nancy Marus, MD;  Location: [facility];  Service: Gynecology;  Laterality: N/A;  . SENTINEL NODE BIOPSY N/A 05/06/2016   Procedure: SENTINEL NODE BIOPSY;  Surgeon: Nancy Marus, MD;  Location: [facility];  Service: Gynecology;  Laterality: N/A;  . TUBAL LIGATION      Past Medical Hx:  Past Medical History:  Diagnosis Date  . Arthritis   . Arthritis of knee, left   . Borderline osteopenia DEXA 2006 and 2012  . Cancer [facility])    endometrial  . Cataract   . Cholesterol serum elevated   . Colon polyps   . FHx: colon cancer   . GERD (gastroesophageal reflux disease)   . Hypothyroidism   . Obesity     Family Hx:  Family History  Problem Relation Age of Onset  . Cancer Mother 42    COLON   . Heart disease  Mother   . Cancer Maternal Aunt   . Breast cancer Maternal Aunt   . Mental illness Maternal Uncle   . Cancer Maternal Grandmother     stomach  . Macular degeneration Maternal Grandmother   . Diabetes Neg Hx     Vitals:  Blood pressure (!) 172/94, pulse 76, temperature 98.5 F (36.9 C), temperature source Oral, resp. rate 18, weight 180 lb 6 oz (81.8 kg).  Physical Exam:  General: Well developed, well nourished female in no acute distress. Alert and oriented x 3. Eyes reddened from crying due to pain. Cardiovascular: Regular rate and rhythm. S1 and S2 normal.  Lungs: Clear to auscultation bilaterally. No wheezes/crackles/rhonchi noted.  Skin: No rashes or lesions present.  Mild ecchymosis noted on the abdomen around incision near the umbilicus.  Back: No CVA tenderness.  Abdomen: Abdomen soft, non-tender and obese. Active bowel sounds in all quadrants. No evidence  of a fluid wave or abdominal masses. Bilateral groins palpated with no masses, lesions, erythema noted. Full strength noted with the lower extremities against resistance.  No tenderness reported with palpation of the left upper leg and lower leg. Extremities: No bilateral cyanosis, edema, or clubbing.   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  Due to the patient's symptoms, it is recommended that she proceed with a CT scan of the abdomen and pelvis to rule out post-operative pelvic fluid collection/lymphocyst placing pressure on her genitofemoral nerve per Dr. Denman George.  Refill on percocet given.  Patient also advised to begin taking gabapentin 100 mg TID to evaluate for improvement in moderate pain symptoms per Dr. Alycia Rossetti.  CT is scheduled for today at 5 pm.  Patient is asking about starting the gabapentin and cancelling her CT if her symptoms improve.  Advised she should proceed with the CT but our office will touch base with her today around 4pm.  Precautions with gabapentin use discussed.  She is advised to call for any questions or concerns.  She is advised to keep her appointment with Dr. Alycia Rossetti next week for post-operative follow up.      Rebacca Votaw DEAL, NP 05/16/2016, 2:11 PM

## 2016-05-19 ENCOUNTER — Encounter: Payer: Self-pay | Admitting: Gynecologic Oncology

## 2016-05-19 ENCOUNTER — Telehealth: Payer: Self-pay | Admitting: Gynecologic Oncology

## 2016-05-19 NOTE — Telephone Encounter (Signed)
Spoke with the patient about CT scan results.  No intervention needed at this time per Dr. Denman George and Dr. Delsa Sale.  Patient advised to call for any needs or concerns.  Our office will follow up with her on Monday for an update.

## 2016-05-19 NOTE — Progress Notes (Signed)
Gynecologic Oncology Multi-Disciplinary Disposition Conference Note  Date of the Conference: May 19, 2016  Patient Name: Kayla Guerrero  Referring Provider: Dr. Nori Riis Primary GYN Oncologist: Dr. 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  Disposition is to chemotherapy with 6 cycles of carboplatin and taxol and vaginal brachytherapy per Dr. Alycia Rossetti.  Genetics referral.  MR of the abdomen/pelvis with and without to re-evaluate renal lesion in three months.   This Multidisciplinary conference took place involving physicians from [place], [place], Radiation Oncology, Pathology, Radiology along with the Gynecologic Oncology Nurse Practitioner and RN.  Comprehensive assessment of the patient's malignancy, staging, need for surgery, chemotherapy, radiation therapy, and need for further testing were reviewed. Supportive measures, both inpatient and following discharge were also discussed. The recommended plan of care is documented. Greater than 35 minutes were spent correlating and coordinating this patient's care.

## 2016-05-20 ENCOUNTER — Telehealth: Payer: Self-pay

## 2016-05-20 NOTE — Telephone Encounter (Signed)
Follow up call placed to see how the patient's pain was controlled since initiating Neurontin 100 MG PO 3 times daily . Patient states she was doing well and then missed a dose and resumed as directed and the pain is "better". Patient states she will continue to take the Neurontin as directed, patient denies further questions at this time.

## 2016-05-21 ENCOUNTER — Ambulatory Visit: Payer: Medicare Other | Attending: Gynecologic Oncology | Admitting: Gynecologic Oncology

## 2016-05-21 ENCOUNTER — Encounter: Payer: Self-pay | Admitting: Gynecologic Oncology

## 2016-05-21 VITALS — BP 114/65 | HR 91 | Temp 98.0°F | Resp 18 | Wt 176.7 lb

## 2016-05-21 DIAGNOSIS — Z86010 Personal history of colonic polyps: Secondary | ICD-10-CM | POA: Insufficient documentation

## 2016-05-21 DIAGNOSIS — K219 Gastro-esophageal reflux disease without esophagitis: Secondary | ICD-10-CM | POA: Insufficient documentation

## 2016-05-21 DIAGNOSIS — Z803 Family history of malignant neoplasm of breast: Secondary | ICD-10-CM | POA: Insufficient documentation

## 2016-05-21 DIAGNOSIS — G629 Polyneuropathy, unspecified: Secondary | ICD-10-CM

## 2016-05-21 DIAGNOSIS — Z90710 Acquired absence of both cervix and uterus: Secondary | ICD-10-CM | POA: Insufficient documentation

## 2016-05-21 DIAGNOSIS — C541 Malignant neoplasm of endometrium: Secondary | ICD-10-CM | POA: Diagnosis present

## 2016-05-21 DIAGNOSIS — Z6828 Body mass index (BMI) 28.0-28.9, adult: Secondary | ICD-10-CM | POA: Diagnosis not present

## 2016-05-21 DIAGNOSIS — G8918 Other acute postprocedural pain: Secondary | ICD-10-CM | POA: Diagnosis not present

## 2016-05-21 DIAGNOSIS — Z7189 Other specified counseling: Secondary | ICD-10-CM | POA: Diagnosis not present

## 2016-05-21 DIAGNOSIS — Z882 Allergy status to sulfonamides status: Secondary | ICD-10-CM | POA: Diagnosis not present

## 2016-05-21 DIAGNOSIS — Z9841 Cataract extraction status, right eye: Secondary | ICD-10-CM | POA: Diagnosis not present

## 2016-05-21 DIAGNOSIS — Z87891 Personal history of nicotine dependence: Secondary | ICD-10-CM | POA: Diagnosis not present

## 2016-05-21 DIAGNOSIS — Z881 Allergy status to other antibiotic agents status: Secondary | ICD-10-CM | POA: Insufficient documentation

## 2016-05-21 DIAGNOSIS — Z9889 Other specified postprocedural states: Secondary | ICD-10-CM | POA: Diagnosis not present

## 2016-05-21 DIAGNOSIS — E039 Hypothyroidism, unspecified: Secondary | ICD-10-CM | POA: Diagnosis not present

## 2016-05-21 DIAGNOSIS — Z888 Allergy status to other drugs, medicaments and biological substances status: Secondary | ICD-10-CM | POA: Insufficient documentation

## 2016-05-21 DIAGNOSIS — Z9049 Acquired absence of other specified parts of digestive tract: Secondary | ICD-10-CM | POA: Insufficient documentation

## 2016-05-21 DIAGNOSIS — Z8249 Family history of ischemic heart disease and other diseases of the circulatory system: Secondary | ICD-10-CM | POA: Insufficient documentation

## 2016-05-21 DIAGNOSIS — E669 Obesity, unspecified: Secondary | ICD-10-CM | POA: Insufficient documentation

## 2016-05-21 DIAGNOSIS — N959 Unspecified menopausal and perimenopausal disorder: Secondary | ICD-10-CM | POA: Diagnosis not present

## 2016-05-21 DIAGNOSIS — Z9842 Cataract extraction status, left eye: Secondary | ICD-10-CM | POA: Insufficient documentation

## 2016-05-21 DIAGNOSIS — H269 Unspecified cataract: Secondary | ICD-10-CM | POA: Insufficient documentation

## 2016-05-21 DIAGNOSIS — Z800 Family history of malignant neoplasm of digestive organs: Secondary | ICD-10-CM | POA: Diagnosis not present

## 2016-05-21 DIAGNOSIS — Z90722 Acquired absence of ovaries, bilateral: Secondary | ICD-10-CM | POA: Diagnosis not present

## 2016-05-21 NOTE — Patient Instructions (Signed)
Begin taking aleve twice daily.  Try using a heating pad as well for pain relief.  You can take the neurontin once during the day and at night but you can adjust the schedule as necessary but use precautions against falls.  Plan to meet with Dr. Marko Plume to discuss and begin chemotherapy.  We will also make a referral for you to meet with Dr. Gery Pray, Radiation Oncologist after you have started your chemo.  The chemo recommended is carboplatin and taxol.  Without treatment, risk of recurrence is 50 % but with treatment, risk drops to 15 %.  Please call for any questions or concerns.

## 2016-05-21 NOTE — Progress Notes (Signed)
Consult Note: Gyn-Onc  Consult was requested by Dr. Nori Riis for the evaluation of Kayla Guerrero 81 y.o. female  CC:  Chief Complaint  Patient presents with  . Acute postoperative pain    Follow up from 05/16/2016  . Endometrial cancer    Assessment/Plan:  [name]  is a 81 y.o.  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 She is doing well postoperatively. She appears to have some neuropathic pain and it is somewhat improved with Neurontin and time. Of note the patient did not map with her ICG sentinel lymph node detection.  She's currently on Neurontin 100 mg 3 times a day. She will increase that to 200 mg at night and 100 mg twice a day. She is not taking any analgesics or anti-inflammatory agents. She has taken Aleve in the past with good benefit. She'll take Aleve twice a day. She will notify us if this does not help her symptoms.  In addition to her postoperative check a detailed discussion was held with the patient and her husband regarding her pathology and the need for adjuvant therapy. Discussed that without adjuvant therapy of she may have up to 50% recurrence of her disease based on the depth of invasion, papillary serous component, and lymphovascular space involvement. This is in the setting of negative staging. I discussed with them that even with treatment she has approximately a 15% recurrence risk. We discussed that the treatment would include 6 cycles of paclitaxel and carboplatin vaginal cuff brachii therapy. They're willing to proceed. We will work on scheduling appointments for her to be seen by both Dr. Marko Plume and by radiation oncology.  25 minutes face-to-face time was spent with the patient and her husband in addition to the postoperative visit. This was a significantly larger service than usual. It was spent discussing pathology and next steps in treatment planning.   HPI: Kayla Guerrero is an  extremely pleasant 81 year old G2P2 who was seen in consultation at the request of Dr Nori Riis for high grade (serous) endometrial cancer.  The patient reports postmenopausal spotting since July, 2017. She was seen by Dr Nori Riis for this who performed a TVUS on 04/08/16 which revealed a 7.3x4x5.2cm uterus with a 3.3cm irregular mass within the endometrium. The ovaries were normal.   A hysteroscopy and D&C was performed on 04/09/16 with several polypoid structures seen and removed. 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   05/06/16: Surgery: Total robotic hysterectomy, bilateral salpingo-oophorectomy, bilateral pelvic and para-aortic lymph node dissection. Failed SLN mapping.  Complications: None   Pathology: Uterus, cervix, bilateral tubes and ovaries, bilateral pelvic and para-aortic lymph nodes to pathology.   Operative findings: Normal cervix, normal appearing uterus and adnexa. Adhesions of the colon to the left pelvic sidewall. Omentum adherent to LUQ. No SLN mapping. No enlarged lymph nodes  Diagnosis 1. Lymph node, biopsy, right peri-aortic - ONE OF ONE LYMPH NODES NEGATIVE FOR MALIGNANCY (0/1). 2. Lymph node, biopsy, left peri-aortic - ONE OF ONE LYMPH NODES NEGATIVE FOR MALIGNANCY (0/1). 3. Lymph node, biopsy, right pelvic - FOUR OF FOUR LYMPH NODES NEGATIVE FOR MALIGNANCY (0/4). 4. Lymph node, biopsy, left pelvic - FIVE OF FIVE LYMPH NODES NEGATIVE FOR MALIGNANCY (0/5). 5. Uterus +/- tubes/ovaries, neoplastic - UTERUS: -ENDO/MYOMETRIUM: INVASIVE MIXED ENDOMETRIOID AND SEROUS CARCINOMA, SPANNING 4 CM. TUMOR INVADES OUTER HALF OF MYOMETRIUM. LYMPHOVASCULAR INVASION PRESENT. SEE ONCOLOGY TABLE. LEIOMYOMA. -SEROSA: UNINVOLVED. NO  MALIGNANCY. - CERVIX: BENIGN SQUAMOUS AND ENDOCERVICAL MUCOSA. NO DYSPLASIA OR MALIGNANCY. - BILATERAL OVARIES: INCLUSION CYSTS. NO MALIGNANCY. - BILATERAL FALLOPIAN TUBES: UNREMARKABLE. NO  MALIGNANCY.  She did very well postoperatively until August 12 when she went out to lunch and since that time noticed some which she describes excruciating pain. She was seen in our clinic and secondary concerns were possible lymphocyst or seroma causing pressure on the femoral nerves and radiculopathy she had a CT scan was ordered which revealed:  CT 05/16/16: IMPRESSION: 1. Extensive subcutaneous emphysema about the abdomen and pelvis, likely postoperative. Probable abdominal pelvic wall small volume hematomas, as above. 2. Right pelvic sidewall fluid collection is likely a seroma or lymphangioma. No explanation for left lower extremity pain. 3. Minimal ill-defined fluid in the presacral space and left adnexa. 4. Small hiatal hernia. 5. **An incidental finding of potential clinical significance has been found. Indeterminate right renal lesion is similar to on the recent exam. Consider further evaluation with dedicated outpatient pre and post contrast abdominal MRI.**  She was started on Neurontin with improvement of pain. When she was seen on the 18th her pain score was 7 today does 1. She states that the pain it has improved somewhat but is still persistent. She's not taking any pain medicine whatsoever including nonsteroidals at this time. She has normal bowel and bladder function. She denies any bleeding. She has no nausea or vomiting. She denies any fevers or chills.  Current Meds:  Outpatient Encounter Prescriptions as of 05/21/2016  Medication Sig  . calcium carbonate (OS-CAL) 600 MG TABS Take 600 mg by mouth daily.    . Cholecalciferol (VITAMIN D) 2000 UNITS tablet Take 2,000 Units by mouth daily.  . fish oil-omega-3 fatty acids 1000 MG capsule Take 1 g by mouth daily.   [name] gabapentin (NEURONTIN) 100 MG capsule Take 1 capsule (100 mg total) by mouth 3 (three) times daily.  [name] glucosamine-chondroitin 500-400 MG tablet Take 1 tablet by mouth 2 (two) times daily.   [name] levothyroxine  (SYNTHROID, LEVOTHROID) 88 MCG tablet TAKE ONE TABLET BY MOUTH ONCE DAILY BEFORE BREAKFAST  . Magnesium 400 MG CAPS Take 1 capsule by mouth daily.    . Multiple Vitamin (MULTI-VITAMIN) tablet Take 1 tablet by mouth daily.    [name] oxyCODONE-acetaminophen (PERCOCET/ROXICET) 5-325 MG tablet Take 1-2 tablets by mouth every 4 (four) hours as needed (moderate to severe pain).  . Red Yeast Rice Extract (RED YEAST RICE PO) Take 1 tablet by mouth 3 (three) times daily.   [name] terbinafine (LAMISIL) 250 MG tablet Take 1 tablet (250 mg total) by mouth daily.   Facility-Administered Encounter Medications as of 05/21/2016  Medication  . influenza  inactive virus vaccine (FLUZONE/FLUARIX) injection 0.5 mL    Allergy:  Allergies  Allergen Reactions  . Levaquin [Levofloxacin Hemihydrate] Other (See Comments)  . Promethazine Hcl Other (See Comments)    fainting  . Sulfa Drugs Cross Reactors Swelling    Social Hx:   Social History   Social History  . Marital status: Married    Spouse name: N/A  . Number of children: 1  . Years of education: N/A   Occupational History  . retired Librarian, academic); Artist    Social History Main Topics  . Smoking status: Former Smoker    Types: Cigarettes    Quit date: 09/29/1980  . Smokeless tobacco: Never Used  . Alcohol use 0.0 oz/week     Comment: 1 glass of wine 3-4 times/week; hard liquor (highball) or a  beer once a week  . Drug use: No  . Sexual activity: Not on file   Other Topics Concern  . Not on file   Social History Narrative   Lives with husband.  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 Son lives in [place]; granddaughter lives in [place] (with her mother--son is divorced)    Past Surgical Hx:  Past Surgical History:  Procedure Laterality Date  . CATARACT EXTRACTION, BILATERAL    . CHOLECYSTECTOMY    . COLONOSCOPY  2010, 04/2013   [facility]  .  PTERYGIUM EXCISION    . ROBOTIC ASSISTED TOTAL HYSTERECTOMY WITH BILATERAL SALPINGO OOPHERECTOMY N/A 05/06/2016   Procedure: XI ROBOTIC ASSISTED  LAPAROSCOPIC TOTAL HYSTERECTOMY WITH BILATERAL SALPINGO OOPHORECTOMY;  Surgeon: Nancy Marus, MD;  Location: [facility];  Service: Gynecology;  Laterality: N/A;  . SENTINEL NODE BIOPSY N/A 05/06/2016   Procedure: SENTINEL NODE BIOPSY;  Surgeon: Nancy Marus, MD;  Location: [facility];  Service: Gynecology;  Laterality: N/A;  . TUBAL LIGATION      Past Medical Hx:  Past Medical History:  Diagnosis Date  . Arthritis   . Arthritis of knee, left   . Borderline osteopenia DEXA 2006 and 2012  . Cancer [facility])    endometrial  . Cataract   . Cholesterol serum elevated   . Colon polyps   . FHx: colon cancer   . GERD (gastroesophageal reflux disease)   . Hypothyroidism   . Obesity     Past Gynecological History:  Tubal ligation, SVD x 2. No LMP recorded. Patient is postmenopausal.  Family Hx:  Family History  Problem Relation Age of Onset  . Cancer Mother 79    COLON   . Heart disease Mother   . Cancer Maternal Aunt   . Breast cancer Maternal Aunt   . Mental illness Maternal Uncle   . Cancer Maternal Grandmother     stomach  . Macular degeneration Maternal Grandmother   . Diabetes Neg Hx     Vitals:  Blood pressure 114/65, pulse 91, temperature 98 F (36.7 C), temperature source Oral, resp. rate 18, weight 176 lb 11.2 oz (80.2 kg), SpO2 97 %.  Physical Exam:  Well-nourished well-developed female in no acute distress.  Abdomen shows well-healed surgical incisions. She is a small amount of ecchymoses. There is no rebound or guarding. Extremities no edema. There is no lymphedema in the groins or anterior thighs.     Lauris Serviss A., MD  05/21/2016, 10:26 AM

## 2016-05-27 ENCOUNTER — Telehealth: Payer: Self-pay | Admitting: Gynecologic Oncology

## 2016-05-27 NOTE — Telephone Encounter (Signed)
Returned call to patient.  Patient stating she is having significant left groin and leg pain that came on strong yesterday at 1 pm and has not stopped.  Previously, "sometimes I would have a few hours with no pain."  Reporting some sensitivity in the right leg but most of her discomfort is in the left.  She states it was so painful last night, she took 2 Neurontin capsules and one percocet and was able to sleep for six hours but she woke up in moderate pain.  She reports little relief with the current regimen of Aleve and Neurontin.  She states she is scheduled to take one Neurontin and aleve at 6 am today then again at 2 pm then Neurontin at bedtime.  Asking about adjusting the medications or another alternative.  Advised I would reach out to Dr. Alycia Rossetti at [facility] to see if she had any other options and would give her a call back.

## 2016-05-27 NOTE — Telephone Encounter (Signed)
Patient informed of Dr. Elenora Gamma recommendations below:  "Please have her take Percocet one every 4 hours as needed and I can see her tomorrow. She can continue her neurontin and ibuprofen. She can take two neurontin if that helps, but only if it helps."  Patient verbalizing understanding.  She is to come to the office tomorrow and advised to call for any needs until that time.

## 2016-05-28 ENCOUNTER — Ambulatory Visit: Payer: Medicare Other | Admitting: Gynecologic Oncology

## 2016-05-28 ENCOUNTER — Other Ambulatory Visit: Payer: Self-pay | Admitting: Gynecologic Oncology

## 2016-05-28 ENCOUNTER — Ambulatory Visit: Payer: Medicare Other | Attending: Gynecologic Oncology | Admitting: Gynecologic Oncology

## 2016-05-28 VITALS — BP 127/78 | HR 76 | Temp 97.7°F | Resp 17 | Ht 66.0 in | Wt 176.3 lb

## 2016-05-28 DIAGNOSIS — Z882 Allergy status to sulfonamides status: Secondary | ICD-10-CM | POA: Insufficient documentation

## 2016-05-28 DIAGNOSIS — G8918 Other acute postprocedural pain: Secondary | ICD-10-CM | POA: Diagnosis not present

## 2016-05-28 DIAGNOSIS — Z881 Allergy status to other antibiotic agents status: Secondary | ICD-10-CM | POA: Insufficient documentation

## 2016-05-28 DIAGNOSIS — Z90710 Acquired absence of both cervix and uterus: Secondary | ICD-10-CM | POA: Diagnosis not present

## 2016-05-28 DIAGNOSIS — E039 Hypothyroidism, unspecified: Secondary | ICD-10-CM | POA: Insufficient documentation

## 2016-05-28 DIAGNOSIS — Z833 Family history of diabetes mellitus: Secondary | ICD-10-CM | POA: Diagnosis not present

## 2016-05-28 DIAGNOSIS — Z888 Allergy status to other drugs, medicaments and biological substances status: Secondary | ICD-10-CM | POA: Insufficient documentation

## 2016-05-28 DIAGNOSIS — K219 Gastro-esophageal reflux disease without esophagitis: Secondary | ICD-10-CM | POA: Insufficient documentation

## 2016-05-28 DIAGNOSIS — Z800 Family history of malignant neoplasm of digestive organs: Secondary | ICD-10-CM | POA: Diagnosis not present

## 2016-05-28 DIAGNOSIS — Z90722 Acquired absence of ovaries, bilateral: Secondary | ICD-10-CM | POA: Insufficient documentation

## 2016-05-28 DIAGNOSIS — Z6824 Body mass index (BMI) 24.0-24.9, adult: Secondary | ICD-10-CM | POA: Insufficient documentation

## 2016-05-28 DIAGNOSIS — Z803 Family history of malignant neoplasm of breast: Secondary | ICD-10-CM | POA: Insufficient documentation

## 2016-05-28 DIAGNOSIS — R1032 Left lower quadrant pain: Principal | ICD-10-CM

## 2016-05-28 DIAGNOSIS — C541 Malignant neoplasm of endometrium: Secondary | ICD-10-CM

## 2016-05-28 DIAGNOSIS — Z9049 Acquired absence of other specified parts of digestive tract: Secondary | ICD-10-CM | POA: Insufficient documentation

## 2016-05-28 DIAGNOSIS — E669 Obesity, unspecified: Secondary | ICD-10-CM | POA: Insufficient documentation

## 2016-05-28 DIAGNOSIS — Z8249 Family history of ischemic heart disease and other diseases of the circulatory system: Secondary | ICD-10-CM | POA: Diagnosis not present

## 2016-05-28 DIAGNOSIS — Z86010 Personal history of colonic polyps: Secondary | ICD-10-CM | POA: Insufficient documentation

## 2016-05-28 DIAGNOSIS — Z9889 Other specified postprocedural states: Secondary | ICD-10-CM | POA: Insufficient documentation

## 2016-05-28 DIAGNOSIS — Z9842 Cataract extraction status, left eye: Secondary | ICD-10-CM | POA: Diagnosis not present

## 2016-05-28 DIAGNOSIS — Z9841 Cataract extraction status, right eye: Secondary | ICD-10-CM | POA: Diagnosis not present

## 2016-05-28 DIAGNOSIS — Z87891 Personal history of nicotine dependence: Secondary | ICD-10-CM | POA: Diagnosis not present

## 2016-05-28 MED ORDER — GABAPENTIN 100 MG PO CAPS: 100.0000 mg | ORAL_CAPSULE | Freq: Three times a day (TID) | ORAL | 0 refills | Status: DC

## 2016-05-28 MED ORDER — OXYCODONE-ACETAMINOPHEN 5-325 MG PO TABS: 1.0000 | ORAL_TABLET | ORAL | 0 refills | Status: DC | PRN

## 2016-05-28 NOTE — Patient Instructions (Signed)
Plan to take the neurontin three times a day ( one capsule in the am and the afternoon, and 2 capsules in the evening).  Take the percocet (one tablet) every four to six hours.  You can also take Aleve twice daily.  Continue this regimen for the next three to four days.  If the pain becomes tolerable, we can begin weaning you off some medications.  To begin the weaning process, we would have you go to one capsule of gabapentin at bedtime instead of two.  Please call us for any questions, concerns, or persistent symptoms.  AM: 1 gabapentin, percocet, aleve  Continue percocet every 4-6 hours  Afternoon: 1 gabapentin, percocet  Continue percocet every 4-6 hours  Bedtime: 2 gabapentin, percocet, aleve  Use precaution when getting out of bed at night if case you are unsteady or drowsy.   Sciatica Sciatica is pain, weakness, numbness, or tingling along your sciatic nerve. The nerve starts in the lower back and runs down the back of each leg. Nerve damage or certain conditions pinch or put pressure on the sciatic nerve. This causes the pain, weakness, and other discomforts of sciatica. HOME CARE   Only take medicine as told by your doctor.  Apply ice to the affected area for 20 minutes. Do this 3-4 times a day for the first 48-72 hours. Then try heat in the same way.  Exercise, stretch, or do your usual activities if these do not make your pain worse.  Go to physical therapy as told by your doctor.  Keep all doctor visits as told.  Do not wear high heels or shoes that are not supportive.  Get a firm mattress if your mattress is too soft to lessen pain and discomfort. GET HELP RIGHT AWAY IF:   You cannot control when you poop (bowel movement) or pee (urinate).  You have more weakness in your lower back, lower belly (pelvis), butt (buttocks), or legs.  You have redness or puffiness (swelling) of your back.  You have a burning feeling when you pee.  You have pain that gets worse when  you lie down.  You have pain that wakes you from your sleep.  Your pain is worse than past pain.  Your pain lasts longer than 4 weeks.  You are suddenly losing weight without reason. MAKE SURE YOU:   Understand these instructions.  Will watch this condition.  Will get help right away if you are not doing well or get worse.   This information is not intended to replace advice given to you by your health care provider. Make sure you discuss any questions you have with your health care provider.   Document Released: 06/24/2008 Document Revised: 06/06/2015 Document Reviewed: 01/25/2012 Elsevier Interactive Patient Education [employer].

## 2016-05-28 NOTE — Progress Notes (Signed)
Consult Note: Gyn-Onc  Consult was requested by Dr. Nori Riis for the evaluation of Kayla Guerrero 81 y.o. female  CC:  Chief Complaint  Patient presents with  . endometrial cancer    follow up  . acute post-operative pain    follow up    Assessment/Plan:  Kayla Guerrero  is a 80 y.o.  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 She is doing well postoperatively. She appears to have some neuropathic pain and it is somewhat improved with Neurontin and time. Of note the patient did not map with her ICG sentinel lymph node detection.  She's currently on Neurontin 100 mg 2 times a day and 200 mg at night. She's taking Percocet every 4-6 hours. She did very well from I saw her last Wednesday 12 Friday. She felt quite well on Friday and she thinks she did too much in that she played bridge for 4 hours and then 1 [employer] shopping. She doesn't really recall how she felt Saturday, Sunday into Monday but did have increasing pain Monday to Tuesday. She had stopped taking her Percocet. When she takes her Percocet her pain is improved. She states the pain is of a different quality now in now she appears to have some sciatic-type symptoms. I had reviewed sciatic pain with her when I saw her last week and she denied it. At this time she'll continue taking her Percocet and her Neurontin. She will use a heating pad which helped significantly. She has her chemotherapy class tomorrow. She will call us and that she starts feeling better we'll start weaning down her medications  HPI: Kayla Guerrero is an extremely pleasant 81 year old G2P2 who was seen in consultation at the request of Dr Nori Riis for high grade (serous) endometrial cancer.   The patient reports postmenopausal spotting since July, 2017. She was seen by Dr Nori Riis for this who performed a TVUS on 04/08/16 which revealed a 7.3x4x5.2cm uterus with a 3.3cm irregular mass within the endometrium. The  ovaries were normal.   A hysteroscopy and D&C was performed on 04/09/16 with several polypoid structures seen and removed. 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   05/06/16: Surgery: Total robotic hysterectomy, bilateral salpingo-oophorectomy, bilateral pelvic and para-aortic lymph node dissection. Failed SLN mapping.  Complications: None   Pathology: Uterus, cervix, bilateral tubes and ovaries, bilateral pelvic and para-aortic lymph nodes to pathology.   Operative findings: Normal cervix, normal appearing uterus and adnexa. Adhesions of the colon to the left pelvic sidewall. Omentum adherent to LUQ. No SLN mapping. No enlarged lymph nodes  Diagnosis 1. Lymph node, biopsy, right peri-aortic - ONE OF ONE LYMPH NODES NEGATIVE FOR MALIGNANCY (0/1). 2. Lymph node, biopsy, left peri-aortic - ONE OF ONE LYMPH NODES NEGATIVE FOR MALIGNANCY (0/1). 3. Lymph node, biopsy, right pelvic - FOUR OF FOUR LYMPH NODES NEGATIVE FOR MALIGNANCY (0/4). 4. Lymph node, biopsy, left pelvic - FIVE OF FIVE LYMPH NODES NEGATIVE FOR MALIGNANCY (0/5). 5. Uterus +/- tubes/ovaries, neoplastic - UTERUS: -ENDO/MYOMETRIUM: INVASIVE MIXED ENDOMETRIOID AND SEROUS CARCINOMA, SPANNING 4 CM. TUMOR INVADES OUTER HALF OF MYOMETRIUM. LYMPHOVASCULAR INVASION PRESENT. SEE ONCOLOGY TABLE. LEIOMYOMA. -SEROSA: UNINVOLVED. NO MALIGNANCY. - CERVIX: BENIGN SQUAMOUS AND ENDOCERVICAL MUCOSA. NO DYSPLASIA OR MALIGNANCY. - BILATERAL OVARIES: INCLUSION CYSTS. NO MALIGNANCY. - BILATERAL FALLOPIAN TUBES: UNREMARKABLE. NO MALIGNANCY.  She did very well postoperatively until August 12 when she went out to lunch and since that  time noticed some which she describes excruciating pain. She was seen in our clinic and secondary concerns were possible lymphocyst or seroma causing pressure on the femoral nerves and radiculopathy she had a CT scan was ordered which revealed:  CT  05/16/16: IMPRESSION: 1. Extensive subcutaneous emphysema about the abdomen and pelvis, likely postoperative. Probable abdominal pelvic wall small volume hematomas, as above. 2. Right pelvic sidewall fluid collection is likely a seroma or lymphangioma. No explanation for left lower extremity pain. 3. Minimal ill-defined fluid in the presacral space and left adnexa. 4. Small hiatal hernia. 5. **An incidental finding of potential clinical significance has been found. Indeterminate right renal lesion is similar to on the recent exam. Consider further evaluation with dedicated outpatient pre and post contrast abdominal MRI.**  I saw her last week which time she was having severe pain on Wednesday. We modified her medications using to Neurontin at night with 100 mg of Neurontin twice a day. Also encouraged her to do Aleve twice a day and Percocet. She started feeling much better almost immediately with this regimen. On Friday she felt so well that she believes she was too active and that she played bridge for 4 hours and that went to [facility]. She does not recall how she felt over the weekend but does remember not feeling well starting Monday to Tuesday and having severe pain. She called yesterday she had stopped taking her pain medication and she was feeling better she was encouraged to start taking again. She states the quality of the pain is feeling different. She was having pain and sensitivity on her skin now she feels like a deeper bone pain that is radiating down the lateral aspect of her leg and it does sound much more like a sciatic-type pain. She recalls having deep bone pain when she was a child that would bring her to 2 years. She states that that pain feels similar to this. She otherwise denies any neurologic symptoms or weakness. She is overall feeling better than last week. She is fearful thinking about chemotherapy. She has her chemotherapy class tomorrow.    Current Meds:  Outpatient Encounter  Prescriptions as of 05/28/2016  Medication Sig  . calcium carbonate (OS-CAL) 600 MG TABS Take 600 mg by mouth daily.    . Cholecalciferol (VITAMIN D) 2000 UNITS tablet Take 2,000 Units by mouth daily.  . fish oil-omega-3 fatty acids 1000 MG capsule Take 1 g by mouth daily.   [name] glucosamine-chondroitin 500-400 MG tablet Take 1 tablet by mouth 2 (two) times daily.   [name] levothyroxine (SYNTHROID, LEVOTHROID) 88 MCG tablet TAKE ONE TABLET BY MOUTH ONCE DAILY BEFORE BREAKFAST  . Magnesium 400 MG CAPS Take 1 capsule by mouth daily.    . Multiple Vitamin (MULTI-VITAMIN) tablet Take 1 tablet by mouth daily.    . Red Yeast Rice Extract (RED YEAST RICE PO) Take 1 tablet by mouth 3 (three) times daily.   [name] terbinafine (LAMISIL) 250 MG tablet Take 1 tablet (250 mg total) by mouth daily.  . [DISCONTINUED] gabapentin (NEURONTIN) 100 MG capsule Take 1 capsule (100 mg total) by mouth 3 (three) times daily. (Patient taking differently: Take 100 mg by mouth 3 (three) times daily. Takes two capsules TID)  . [DISCONTINUED] oxyCODONE-acetaminophen (PERCOCET/ROXICET) 5-325 MG tablet Take 1-2 tablets by mouth every 4 (four) hours as needed (moderate to severe pain).   Facility-Administered Encounter Medications as of 05/28/2016  Medication  . influenza  inactive virus vaccine (FLUZONE/FLUARIX) injection 0.5 mL  Allergy:  Allergies  Allergen Reactions  . Levaquin [Levofloxacin Hemihydrate] Other (See Comments)  . Promethazine Hcl Other (See Comments)    fainting  . Sulfa Drugs Cross Reactors Swelling    Social Hx:   Social History   Social History  . Marital status: Married    Spouse name: N/A  . Number of children: 1  . Years of education: N/A   Occupational History  . retired Librarian, academic); Artist    Social History Main Topics  . Smoking status: Former Smoker    Types: Cigarettes    Quit date: 09/29/1980  . Smokeless tobacco: Never Used  . Alcohol use 0.0 oz/week     Comment: 1 glass of wine 3-4  times/week; hard liquor (highball) or a beer once a week  . Drug use: No  . Sexual activity: Not on file   Other Topics Concern  . Not on file   Social History Narrative   Lives with husband.  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 Son lives in [place]; granddaughter lives in [place] (with her mother--son is divorced)    Past Surgical Hx:  Past Surgical History:  Procedure Laterality Date  . CATARACT EXTRACTION, BILATERAL    . CHOLECYSTECTOMY    . COLONOSCOPY  2010, 04/2013   Buccini  . PTERYGIUM EXCISION    . ROBOTIC ASSISTED TOTAL HYSTERECTOMY WITH BILATERAL SALPINGO OOPHERECTOMY N/A 05/06/2016   Procedure: XI ROBOTIC ASSISTED  LAPAROSCOPIC TOTAL HYSTERECTOMY WITH BILATERAL SALPINGO OOPHORECTOMY;  Surgeon: Nancy Marus, MD;  Location: [facility];  Service: Gynecology;  Laterality: N/A;  . SENTINEL NODE BIOPSY N/A 05/06/2016   Procedure: SENTINEL NODE BIOPSY;  Surgeon: Nancy Marus, MD;  Location: [facility];  Service: Gynecology;  Laterality: N/A;  . TUBAL LIGATION      Past Medical Hx:  Past Medical History:  Diagnosis Date  . Arthritis   . Arthritis of knee, left   . Borderline osteopenia DEXA 2006 and 2012  . Cancer [facility])    endometrial  . Cataract   . Cholesterol serum elevated   . Colon polyps   . FHx: colon cancer   . GERD (gastroesophageal reflux disease)   . Hypothyroidism   . Obesity     Past Gynecological History:  Tubal ligation, SVD x 2. No LMP recorded. Patient is postmenopausal.  Family Hx:  Family History  Problem Relation Age of Onset  . Cancer Mother 4    COLON   . Heart disease Mother   . Cancer Maternal Aunt   . Breast cancer Maternal Aunt   . Mental illness Maternal Uncle   . Cancer Maternal Grandmother     stomach  . Macular degeneration Maternal Grandmother   . Diabetes Neg Hx     Vitals:  Blood pressure 127/78, pulse 76, temperature  97.7 F (36.5 C), temperature source Oral, resp. rate 17, height 5\' 6"  (1.676 m), weight 176 lb 4.8 oz (80 kg), SpO2 98 %.  Physical Exam:  Well-nourished well-developed female in no acute distress.   Nancy Marus A., MD  05/28/2016, 12:33 PM

## 2016-05-29 ENCOUNTER — Encounter: Payer: Self-pay | Admitting: *Deleted

## 2016-05-29 ENCOUNTER — Other Ambulatory Visit: Payer: Medicare Other

## 2016-06-03 ENCOUNTER — Encounter: Payer: Self-pay | Admitting: Oncology

## 2016-06-03 ENCOUNTER — Other Ambulatory Visit: Payer: Self-pay | Admitting: Oncology

## 2016-06-03 ENCOUNTER — Other Ambulatory Visit (HOSPITAL_BASED_OUTPATIENT_CLINIC_OR_DEPARTMENT_OTHER): Payer: Medicare Other

## 2016-06-03 ENCOUNTER — Telehealth: Payer: Self-pay | Admitting: Oncology

## 2016-06-03 ENCOUNTER — Ambulatory Visit (HOSPITAL_BASED_OUTPATIENT_CLINIC_OR_DEPARTMENT_OTHER): Payer: Medicare Other | Admitting: Oncology

## 2016-06-03 ENCOUNTER — Telehealth: Payer: Self-pay | Admitting: Gynecologic Oncology

## 2016-06-03 VITALS — BP 161/76 | HR 76 | Temp 97.9°F | Resp 18 | Ht 66.0 in | Wt 178.7 lb

## 2016-06-03 DIAGNOSIS — M159 Polyosteoarthritis, unspecified: Secondary | ICD-10-CM

## 2016-06-03 DIAGNOSIS — N289 Disorder of kidney and ureter, unspecified: Secondary | ICD-10-CM

## 2016-06-03 DIAGNOSIS — E039 Hypothyroidism, unspecified: Secondary | ICD-10-CM

## 2016-06-03 DIAGNOSIS — C541 Malignant neoplasm of endometrium: Secondary | ICD-10-CM | POA: Diagnosis not present

## 2016-06-03 DIAGNOSIS — G629 Polyneuropathy, unspecified: Secondary | ICD-10-CM

## 2016-06-03 DIAGNOSIS — M4690 Unspecified inflammatory spondylopathy, site unspecified: Secondary | ICD-10-CM

## 2016-06-03 DIAGNOSIS — M792 Neuralgia and neuritis, unspecified: Secondary | ICD-10-CM

## 2016-06-03 LAB — CBC WITH DIFFERENTIAL/PLATELET
BASO%: 0.4 % (ref 0.0–2.0)
Basophils Absolute: 0 10*3/uL (ref 0.0–0.1)
EOS%: 14.9 % — AB (ref 0.0–7.0)
Eosinophils Absolute: 1 10*3/uL — ABNORMAL HIGH (ref 0.0–0.5)
HCT: 36.7 % (ref 34.8–46.6)
HGB: 12.2 g/dL (ref 11.6–15.9)
LYMPH%: 28.7 % (ref 14.0–49.7)
MCH: 29.1 pg (ref 25.1–34.0)
MCHC: 33.2 g/dL (ref 31.5–36.0)
MCV: 87.6 fL (ref 79.5–101.0)
MONO#: 0.3 10*3/uL (ref 0.1–0.9)
MONO%: 4.9 % (ref 0.0–14.0)
NEUT#: 3.4 10*3/uL (ref 1.5–6.5)
NEUT%: 51.1 % (ref 38.4–76.8)
PLATELETS: 289 10*3/uL (ref 145–400)
RBC: 4.19 10*6/uL (ref 3.70–5.45)
RDW: 13.6 % (ref 11.2–14.5)
WBC: 6.7 10*3/uL (ref 3.9–10.3)
lymph#: 1.9 10*3/uL (ref 0.9–3.3)

## 2016-06-03 LAB — COMPREHENSIVE METABOLIC PANEL
ANION GAP: 9 meq/L (ref 3–11)
AST: 18 U/L (ref 5–34)
Albumin: 3 g/dL — ABNORMAL LOW (ref 3.5–5.0)
Alkaline Phosphatase: 61 U/L (ref 40–150)
BUN: 12.5 mg/dL (ref 7.0–26.0)
CHLORIDE: 105 meq/L (ref 98–109)
CO2: 28 meq/L (ref 22–29)
CREATININE: 0.9 mg/dL (ref 0.6–1.1)
Calcium: 9 mg/dL (ref 8.4–10.4)
EGFR: 61 mL/min/{1.73_m2} — ABNORMAL LOW (ref >90)
Glucose: 143 mg/dl — ABNORMAL HIGH (ref 70–140)
Potassium: 4 mEq/L (ref 3.5–5.1)
SODIUM: 141 meq/L (ref 136–145)
Total Bilirubin: <0.3 mg/dL (ref 0.20–1.20)
Total Protein: 6.8 g/dL (ref 6.4–8.3)

## 2016-06-03 NOTE — Progress Notes (Signed)
[facility] NEW PATIENT EVALUATION   Name: Kayla Guerrero Date: June 03, 2016  MRN: [id number] DOB: March 05, 1935  REFERRING PHYSICIAN: Imagene Gurney Guerrero/ [place] cc Kayla Ohara, MD Kayla Guerrero, Kayla Guerrero, Kayla Guerrero, Kayla Guerrero (ophth), Kayla Guerrero (dentist), Kayla Guerrero (podiatry)    REASON FOR REFERRAL: IB uterine serous carcinoma, for adjuvant chemotherapy.   HISTORY OF PRESENT ILLNESS:Kayla Guerrero is a 81 y.o. female who is seen in consultation, together with husband , at the request of Dr Kayla Guerrero, for consideration of adjuvant chemotherapy for recently diagnosed IB serous endometrial carcioma. Recommendation is for 6 cycles of chemotherapy with consideration of vaginal cuff brachytherapy.  Patient and husband attended chemotherapy education class prior to this visit.  She has not see radiation oncology as yet.    Patient presented to PCP with new vaginal discharge and some vaginal spotting, referred to Dr Kayla Guerrero, whom she had known previously. D&C on 04-09-16 had high grade carcinoma favoring serous histology [phone]). She was referred to gyn oncology, saw Dr Kayla Guerrero on 04-23-16. CT CAP 04-30-16 no evidence of metastatic disease, no pelvic adenopathy, no ascites, thickened endometrial canal. On 05-06-16 Dr Kayla Guerrero performed robotic hysterectomy BSO bilateral pelvic and paraaortic node evaluation (failed sentinel LN mapping); patient was DC home on 05-07-16.  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  Post operative course was remarkable for acute onset left groin and thigh pain ~ 6 days post op, with repeat CT 05-16-16 without findings specific for those complaints; she was treated with neurontin and percocet, tapering now as symptoms have progressively improved.     REVIEW OF SYSTEMS as above, also: Intentional  weight loss 40 lbs in past year with diet and exercise. Appetite ok. No problems with peripheral IV access. Neuropathic pain left groin and upper inner left thigh minimal. No HA, good visual acuity with glasses, no difficulty hearing, no dental problems and up to date on dental evaluation last month, thyroid medication stable x years followed by PCP, no bleeding. No SOB or cough, no chest pain or cardiac symptoms. Tendency to chronic constipation, increased constipation after surgery improved with benefiber and miralax. No nausea, occasional GERD, some soreness low in throat.No fever. Sleepy with gabapentin. No changes in breasts. Bladder ok. No swelling LE. Arthritis knees and left shoulder, treated with injections by Dr [place]la Guerrero. No peripheral neuropathy.  Remainder of full 10 point review of systems negative.   ALLERGIES: Levaquin [levofloxacin hemihydrate]; Promethazine hcl; and Sulfa drugs cross reactors  Promethazine "fainting" Sulfa "swelling" Levaquin- does not remember reaction  PAST MEDICAL/ SURGICAL HISTORY:     Kayla Guerrero Indeterminate right renal lesion on scans " consider MRI" Mammograms at [facility], last 05-04-15 Colonoscopy 2010, 2014  ([place]) BTL remotely Cholecystectomy Hypothyroid GERD Normal bone density by scan [facility]    CURRENT MEDICATIONS: reviewed as listed now in EMR Scripts for decadron 20 mg 12 hrs and 6 hrs prior to taxol, each dose with food #10 for first cycle. Ativan 0.5 mg, zofran 8 mg  PHARMACY Kayla Guerrero/ [place]   SOCIAL HISTORY: originally from [place], in [place] since 1978. 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 Smoked 1 ppd x 20 years, DCd 1982. Enjoys water aerobics. Son lives in [place], tracks oil shipments. Granddaughter in [place]. Husband Kayla Guerrero has children and grandchildren locally. Kayla Guerrero was  treated by Dr Kayla Guerrero for bladder cancer with adriamycin, CDDP, velban. Has HCPOA and Living Will  FAMILY  HISTORY:   Mother with colon or rectal ca age 60, heart disease Maternal aunt breast ca in 20s Maternal grandmother "stomach cancer" late 21s         PHYSICAL EXAM:  height is 5' 6" (1.676 m) and weight is 178 lb 11.2 oz (81.1 kg). Her oral temperature is 97.9 F (36.6 C). Her blood pressure is 161/76 (abnormal) and her pulse is 76. Her respiration is 18 and oxygen saturation is 98%.  Alert, pleasant, cooperative lady looks stated age, easily mobile, looks comfortable. Good historian. Respirations not labored RA  HEENT: PERRL, not icteric. Oral mucosa moist and clear, no obvious dental concerns. Neck supple without JVD. No clear thyroid mass  RESPIRATORY: lungs clear to A and P  CARDIAC/ VASCULAR: heart RRR no gallop. Peripheral pulses symmetrical and intact  ABDOMEN: soft, nontender, a few BS. Surgical incisions closed and not tender or erythematous. No appreciable HSM.   LYMPH NODES:no cervical, supraclavicular, axillary or inguinal adenopathy  BREASTS: bilaterally without dominant mass, skin or nipple findings. Axillae benign  NEUROLOGIC: CN, motor, sensory, cerebellar nonfocal  SKIN: without rash, ecchymosis, petechiae  MUSCULOSKELETAL: back not tender. LE no edema, cords, tenderness.     LABORATORY DATA:  Results for orders placed or performed in visit on 06/03/16 (from the past 48 hour(s))  CBC with Differential     Status: Abnormal   Collection Time: 06/03/16  1:43 PM  Result Value Ref Range   WBC 6.7 3.9 - 10.3 10e3/uL   NEUT# 3.4 1.5 - 6.5 10e3/uL   HGB 12.2 11.6 - 15.9 g/dL   HCT 36.7 34.8 - 46.6 %   Platelets 289 145 - 400 10e3/uL   MCV 87.6 79.5 - 101.0 fL   MCH 29.1 25.1 - 34.0 pg   MCHC 33.2 31.5 - 36.0 g/dL   RBC 4.19 3.70 - 5.45 10e6/uL   RDW 13.6 11.2 - 14.5 %   lymph# 1.9 0.9 - 3.3 10e3/uL   MONO# 0.3 0.1 - 0.9 10e3/uL   Eosinophils Absolute 1.0 (H) 0.0 - 0.5 10e3/uL   Basophils Absolute 0.0 0.0 - 0.1 10e3/uL   NEUT% 51.1 38.4 - 76.8 %   LYMPH%  28.7 14.0 - 49.7 %   MONO% 4.9 0.0 - 14.0 %   EOS% 14.9 (H) 0.0 - 7.0 %   BASO% 0.4 0.0 - 2.0 %  Comprehensive metabolic panel     Status: Abnormal   Collection Time: 06/03/16  1:43 PM  Result Value Ref Range   Sodium 141 136 - 145 mEq/L   Potassium 4.0 3.5 - 5.1 mEq/L   Chloride 105 98 - 109 mEq/L   CO2 28 22 - 29 mEq/L   Glucose 143 (H) 70 - 140 mg/dl    Comment: Glucose reference range is for nonfasting patients. Fasting glucose reference range is 70- 100.   BUN 12.5 7.0 - 26.0 mg/dL   Creatinine 0.9 0.6 - 1.1 mg/dL   Total Bilirubin <0.30 0.20 - 1.20 mg/dL   Alkaline Phosphatase 61 40 - 150 U/L   AST 18 5 - 34 U/L   ALT <9 0 - 55 U/L   Total Protein 6.8 6.4 - 8.3 g/dL   Albumin 3.0 (L) 3.5 - 5.0 g/dL   Calcium 9.0 8.4 - 10.4 mg/dL   Anion Gap 9 3 - 11 mEq/L   EGFR 61 (L) >90 ml/min/1.73 m2  Comment: eGFR is calculated using the CKD-EPI Creatinine Equation (2009)      PATHOLOGY: Hogan, Sirena O Collected: 04/09/2016 Client: [facility] Accession: [id number] Received: 04/09/2016 Kayla Neal, MD REPORT OF SURGICAL PATHOLOGYINAL DIAGNOSIS Diagnosis Endometrium, curettage - HIGH GRADE ENDOMETRIAL CARCINOMA, SEE COMMENT. Microscopic Comment The overall appearance favors serous carcinoma.   FINAL for Comas, Derrisha O ([id number]) Patient: Zachar, Nirvi O Collected: 05/06/2016 Client: [facility] Accession: [id number] Received: 05/06/2016 Paola Guerrero REPORT OF SURGICAL PATHOLOGY FINAL DIAGNOSIS Diagnosis 1. Lymph node, biopsy, right peri-aortic - ONE OF ONE LYMPH NODES NEGATIVE FOR MALIGNANCY (0/1). 2. Lymph node, biopsy, left peri-aortic - ONE OF ONE LYMPH NODES NEGATIVE FOR MALIGNANCY (0/1). 3. Lymph node, biopsy, right pelvic - FOUR OF FOUR LYMPH NODES NEGATIVE FOR MALIGNANCY (0/4). 4. Lymph node, biopsy, left pelvic - FIVE OF FIVE LYMPH NODES NEGATIVE FOR MALIGNANCY (0/5). 5. Uterus +/- tubes/ovaries, neoplastic -  UTERUS: -ENDO/MYOMETRIUM: INVASIVE MIXED ENDOMETRIOID AND SEROUS CARCINOMA, SPANNING 4 CM. TUMOR INVADES OUTER HALF OF MYOMETRIUM. LYMPHOVASCULAR INVASION PRESENT. SEE ONCOLOGY TABLE. LEIOMYOMA. -SEROSA: UNINVOLVED. NO MALIGNANCY. - CERVIX: BENIGN SQUAMOUS AND ENDOCERVICAL MUCOSA. NO DYSPLASIA OR MALIGNANCY. - BILATERAL OVARIES: INCLUSION CYSTS. NO MALIGNANCY. - BILATERAL FALLOPIAN TUBES: UNREMARKABLE. NO MALIGNANCY. Microscopic Comment 5. ONCOLOGY TABLE-UTERUS, CARCINOMA OR CARCINOSARCOMA Specimen: Uterus, cervix, bilateral ovaries and fallopian tubes, bilateral pelvic and para-aortic lymph nodes. Procedure: Hysterectomy with bilateral salpingo-oophorectomy. Lymph node sampling performed: Bilateral pelvic and para-aortic lymph node biopsies Specimen integrity: Intact. Maximum tumor size: 4 cm Histologic type: Mixed endometrioid (80%) and serous (20%) carcinoma.) Grade: High grade based on serous component. Myometrial invasion: 2.0 cm where myometrium is 2.1 cm in thickness Cervical stromal involvement: Not identified. Extent of involvement of other organs: Uninvolved. Lymph - vascular invasion: Present. Peritoneal washings: N/A. Lymph nodes: Examined: 0 Sentinel 11 Non-sentinel 11 Total Lymph nodes with metastasis: 0 Isolated tumor cells (< 0.2 mm): 0 Micrometastasis: (> 0.2 mm and < 2.0 mm): 0 Macrometastasis: (> 2.0 mm): 0 Extracapsular extension: N/A. Pelvic lymph nodes: 0 involved of 9 lymph nodes. Para-aortic lymph nodes: 0 involved of 2 lymph nodes. Other (specify involvement and site): None. TNM code: pT1b, pN0 FIGO Stage (based on pathologic findings, needs clinical correlation): IB  RADIOGRAPHY:   EXAM: CT CHEST, ABDOMEN, AND PELVIS WITH CONTRAST  04-30-16 COMPARISON:  None.  FINDINGS: CT CHEST FINDINGS  Cardiovascular: The heart is normal in size. No pericardial effusion. Moderate scattered atherosclerotic calcifications involving the thoracic aorta but  no aneurysm or dissection. The branch vessels are patent. Coronary artery calcifications are noted.  Mediastinum/Nodes: No mediastinal or hilar mass or lymphadenopathy. Small scattered lymph nodes are noted. The esophagus is grossly normal. There is a small hiatal hernia.  No breast masses, supraclavicular or axillary lymphadenopathy. Small right thyroid nodule is noted.  Lungs/Pleura: Mild emphysematous changes are noted. No acute pulmonary findings. No worrisome pulmonary nodules to suggest metastatic disease. No pleural effusion. No interstitial lung disease or bronchiectasis.  Musculoskeletal: No significant bony findings.  CT ABDOMEN PELVIS FINDINGS  Hepatobiliary: No focal hepatic lesions have a few small scattered cysts. Mild intra and extrahepatic biliary dilatation due to prior cholecystectomy.  Pancreas: No mass, inflammation or ductal dilatation.  Spleen: Normal size.  No focal lesions.  Adrenals/Urinary Tract: The adrenal glands and kidneys are unremarkable. Midpole 12.5 mm right renal lesion posteriorly is indeterminate. It could be an enhancing lesion or hyperdense cyst. Attention on future/ follow-up scans is suggested. No renal calculi or hydronephrosis. The ureters and bladder are normal.    Stomach/Bowel: The stomach, duodenum, small bowel and colon are grossly normal without oral contrast. No acute inflammatory changes, mass lesions or obstructive findings. Moderate stool throughout the colon and down into the rectum suggesting constipation. Moderate sigmoid diverticulosis without findings for acute diverticulitis. The terminal ileum is normal. The appendix is normal.  Vascular/Lymphatic: Advanced atherosclerotic calcifications involving the aorta and branch vessels. No focal aneurysm or dissection. The major venous structures are patent. A retroaortic left renal vein is noted.  Reproductive: Thickened/ widened endometrial canal consistent  with known endometrial cancer. Suspect fibroids in the anterior myometrium. The ovaries are grossly normal.  Other: No pelvic mass or pelvic lymphadenopathy. A few small scattered lymph nodes are noted. No inguinal mass or adenopathy. The bladder is normal. Small right-sided bladder diverticulum is noted.  Musculoskeletal: No significant bony findings. Moderate degenerative changes involving the spine. No findings for osseous metastatic disease.  IMPRESSION: 1. Markedly thickened/widened endometrium consistent with known endometrial cancer. No evidence of serosal or extra uterine extension. 2. No findings to suggest metastatic disease involving the chest, abdomen or pelvis. 3. Indeterminant 12.5 mm right renal lesion, small enhancing mass versus hemorrhagic cyst. Attention on future scans is suggested. 4. Atherosclerotic calcifications involving the thoracic and abdominal aorta and branch vessels but no focal aneurysm. 5. Moderate stool throughout the colon and down into the rectum may suggest constipation.     Study Result  05-16-16 CLINICAL DATA:  Endometrial cancer 7/17. Acute postoperative pain. Left groin and thigh pain for 4 days. Ten days postop robotic assisted hysterectomy and bilateral oophorectomy. Evaluate for pelvic fluid collection.  EXAM: CT ABDOMEN AND PELVIS WITH CONTRAST   COMPARISON:  04/30/2016  FINDINGS: Lower chest: Clear lung bases. Normal heart size without pericardial or pleural effusion. Small hiatal hernia.  Hepatobiliary: Pericholecystic hepatic cyst. No suspicious liver lesion. Cholecystectomy. Intrahepatic and extrahepatic ducts are upper normal, not significantly changed.  Pancreas: Normal, without mass or ductal dilatation.  Spleen: Normal in size, without focal abnormality.  Adrenals/Urinary Tract: Normal adrenal glands. Interpolar right renal lesion is again identified and similar. 12 mm and greater than fluid density  on image 32/series 3. There is also a too small to characterize lower pole right renal lesion.  Normal urinary bladder.  Stomach/Bowel: Normal remainder of the stomach. Extensive colonic diverticulosis. Normal terminal ileum and appendix. Normal small bowel.  Vascular/Lymphatic: Aortic and branch vessel atherosclerosis. Retroaortic left renal vein. No abdominopelvic adenopathy.  Reproductive: Hysterectomy.  No adnexal mass.  Other: Large volume subcutaneous emphysema about the abdomen and pelvis. small locules of air within the abdominal wall, including on image 51/series 3. Felt to be extraperitoneal.  There is ill-defined edema in the presacral space, likely postoperative. image 66/series 3. No well-defined presacral fluid collection.  Right pelvic sidewall fluid collection measures 4.1 x 2.9 cm on image 71/ series 3. This is simple in appearance. There is minimal left adnexal fluid, ill-defined on image 68/series 3. Right abdominal wall subcutaneous soft tissue density is relatively ill-defined and could represent trocar induced hematoma. 3.2 x 1.1 cm on image 41/series 3. Similar findings about the left lateral pelvic wall at 1.4 cm on image 54/series 3.  Musculoskeletal: Osteopenia.  Lumbosacral spondylosis.  IMPRESSION: 1. Extensive subcutaneous emphysema about the abdomen and pelvis, likely postoperative. Probable abdominal pelvic wall small volume hematomas, as above. 2. Right pelvic sidewall fluid collection is likely a seroma or lymphangioma. No explanation for left lower extremity pain. 3. Minimal ill-defined fluid in the presacral space and left adnexa. 4.  Small hiatal hernia. 5. **An incidental finding of potential clinical significance has been found. Indeterminate right renal lesion is similar to on the recent exam. Consider further evaluation with dedicated outpatient pre and post contrast abdominal MRI.**       DISCUSSION: All of history  above reviewed with patient and husband, including circumstances surrounding presentation and diagnosis, and surgical findings. We have discussed rationale for adjuvant chemotherapy given serous carcinoma; Dr Kayla Guerrero recommends 6 cycles of taxol carboplatin.  We have discussed improvements in outpatient administration of chemotherapy, including improvements in antiemetics and growth factor support, since patient received highly emetogenic chemotherapy in hospital >20 yrs ago. She would like to try every 3 week treatment with taxol and carboplatin, understands that she may have significant taxol aches.    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 Plan adjuvant carboplatin taxol q 3 weeks x 6 cycles, begin 06-11-16 if preauthorization obtained.  Consider vaginal brachytherapy after chemotherpay. 2.neuropathic pain left inguinal area postoperatively, no clear etiology on repeat CT AP then. Pain is improving, patient glad to wean down and off gabapentin and percocet per gyn onc.  3.Indeterminate right renal lesion on CT 4.hypothyroid on replacement by PCP 5.post cholecystectomy and remote BTL 6.past GERD 7.Up to date mammograms and colonoscopy 8.Atherosclerotic thoracic and abdominal aorta by CT 9.Degenerative arthritis spine 10.intentional weight loss in past year, ~ 40 lbs  Patient and husband have had questions answered to their satisfaction and are in agreement with plan above. They can contact this office for questions or concerns at any time prior to next scheduled visit.  Chemo orders placed using [facility] pathways. [facility] order placed. Managed care notified for preauthorization.  Route PCP, cc Dr Nori Riis, gyn onc Time spent 55 min , including  >50% discussion and coordination of care.    Evlyn Clines, MD 06/03/2016 5:29 PM

## 2016-06-03 NOTE — Telephone Encounter (Signed)
appt made and avs printed °

## 2016-06-03 NOTE — Telephone Encounter (Signed)
Returned call to patient.  Patient stating she is doing much better with her left groin/leg pain "pretty much gone."  She is asking about weaning herself off the medications prescribed for her discomfort.  Advised to continue taking one neurontin with an aleve in the morning but to cut the evening dose of neurontin to one capsule instead of two.  Also advised to begin stretching out the use of percocet to one tablet every 6-8 hours as needed for pain.  She is to call our office in one to two days with an update and to adjust her medication schedule further to continue the weaning process.  No concerns voiced.  Advised to call for any needs.  Weaning schedule per Dr. Alycia Rossetti.  Patient is coming in to see Dr. Marko Plume today as well.

## 2016-06-05 ENCOUNTER — Telehealth: Payer: Self-pay | Admitting: *Deleted

## 2016-06-05 DIAGNOSIS — C541 Malignant neoplasm of endometrium: Secondary | ICD-10-CM

## 2016-06-05 NOTE — Telephone Encounter (Signed)
Received call from pt stating that Dr Marko Plume was going to call in some medications for her prior to treatment next week.  She has checked with her pharmacy & they do not have them.  Pt can be reached at [phone].  Message to Dr Jorja Loa RN

## 2016-06-06 DIAGNOSIS — M159 Polyosteoarthritis, unspecified: Secondary | ICD-10-CM | POA: Insufficient documentation

## 2016-06-06 DIAGNOSIS — G629 Polyneuropathy, unspecified: Secondary | ICD-10-CM | POA: Insufficient documentation

## 2016-06-06 MED ORDER — LORAZEPAM 0.5 MG PO TABS: ORAL_TABLET | ORAL | 0 refills | Status: DC

## 2016-06-06 MED ORDER — ONDANSETRON HCL 8 MG PO TABS: 8.0000 mg | ORAL_TABLET | Freq: Three times a day (TID) | ORAL | 0 refills | Status: DC | PRN

## 2016-06-06 MED ORDER — DEXAMETHASONE 4 MG PO TABS: ORAL_TABLET | ORAL | 0 refills | Status: DC

## 2016-06-06 NOTE — Telephone Encounter (Signed)
  [place]la [place], [place] Female, 81 y.o., 11-May-1935 Weight:  178 lb 11.2 oz (81.1 kg) Phone:  [phone] PCP:  Rita Ohara, MD MRN:  [id number] MyChart:  Active Next Appt:  06/11/2016 Message  Received: Wilburn Mylar  Message Contents  [name], MD  Baruch Merl, RN; Janace Hoard, RN         From Triage  Received call from pt stating that Dr Marko Plume was going to call in some medications for her prior to treatment next week. She has checked with her pharmacy & they do not have them. Pt can be reached at [phone]. Message to Dr Jorja Loa RN   Sorry, I just have not gotten to this one yet. '  Please send in scripts, generics fine, for  Decadron 4 mg Five tablets with food 12 hrs and 6 hrs prior to chemo #10  zofran 8 mg q 8 hr prn nausea will not make drowsy #30  Ativan 0.5 mg SL or po q 6 hr prn nausea Will make drowsy #20   RN please call her to review meds and times a day or so prior to first chemo   thanks

## 2016-06-06 NOTE — Telephone Encounter (Signed)
Told Kayla Guerrero that her medications were sent to [facility] today. Reviewed when to take decadron premeds for treatment 06-11-17 0800.  Pt repeated instructions correctly.

## 2016-06-06 NOTE — Progress Notes (Signed)
START ON PATHWAY REGIMEN - Uterine  UTOS50: Carboplatin + Paclitaxel (6/175) q21 Days x 6 Cycles   A cycle is every 21 days:     Paclitaxel (Taxol(R)) 175 mg/m2 in 500 mL NS IV over 3 hours day 1 Dose Mod: None     Carboplatin (Paraplatin(R)) AUC=6 in 250 mL NS IV over 1 hour day 1 Dose Mod: None Additional Orders: * All AUC calculations intended to be used in [employer] formula  **Always confirm dose/schedule in your pharmacy ordering system**    Patient Characteristics: Papillary Serous and Clear Cell Histology, First Line, Medically Operable AJCC T Stage: 1b AJCC N Stage: 0 Stage Grouping: IB AJCC M Stage: 0 Would you be surprised if this patient died  in the next year? I would be surprised if this patient died in the next year Line of therapy: First Line Patient Status: Medically Operable  Intent of Therapy: Curative Intent, Discussed with Patient

## 2016-06-09 ENCOUNTER — Telehealth: Payer: Self-pay | Admitting: Gynecologic Oncology

## 2016-06-09 NOTE — Telephone Encounter (Signed)
Returned call to patient.  Patient stating she is feeling the best she has felt today and asking about weaning off of her medications for nerve pain.  Currently she is taking one gabapentin in the am and in the evening with one percocet at bedtime.  Advised she could stop the evening dose of gabapentin today and then tomorrow if she is feeling well, she could stop the percocet at bedtime.  On Wed., if pain still under control, she is advised she could hold her am dose of gabapentin.  Verbalizing understanding.   Patient asking if she could have an steroid injection in her shoulder while receiving chemotherapy.  Also stating her Ativan needed prior auth.  Advised patient that I would let Dr. Mariana Kaufman RN know and would defer the question about receiving a steroid injection to Dr. Marko Plume.

## 2016-06-10 ENCOUNTER — Encounter: Payer: Self-pay | Admitting: *Deleted

## 2016-06-10 ENCOUNTER — Telehealth: Payer: Self-pay

## 2016-06-10 NOTE — Telephone Encounter (Signed)
Told [name] that Kayla Guerrero can have steroid injections in shoulders while on chemotherapy.  It needs to be coordinated around her blood counts and treatments. She can discuss timing with Dr. Marko Plume at  Visit 06-16-16. The ativan is in the authorization process with her insurance.

## 2016-06-10 NOTE — Progress Notes (Unsigned)
PRIOR AUTHORIZATION SENT TO PLAN FOR LORAZEPAM.

## 2016-06-11 ENCOUNTER — Other Ambulatory Visit: Payer: Self-pay | Admitting: Oncology

## 2016-06-11 ENCOUNTER — Ambulatory Visit (HOSPITAL_BASED_OUTPATIENT_CLINIC_OR_DEPARTMENT_OTHER): Payer: Medicare Other

## 2016-06-11 VITALS — BP 147/67 | HR 73 | Temp 98.6°F | Resp 18

## 2016-06-11 DIAGNOSIS — Z5111 Encounter for antineoplastic chemotherapy: Secondary | ICD-10-CM | POA: Diagnosis not present

## 2016-06-11 DIAGNOSIS — C541 Malignant neoplasm of endometrium: Secondary | ICD-10-CM | POA: Diagnosis not present

## 2016-06-11 MED ORDER — DIPHENHYDRAMINE HCL 50 MG/ML IJ SOLN
INTRAMUSCULAR | Status: AC
  Filled 2016-06-11: qty 1

## 2016-06-11 MED ORDER — SODIUM CHLORIDE 0.9 % IV SOLN
417.0000 mg | Freq: Once | INTRAVENOUS | Status: AC
  Administered 2016-06-11: 420 mg via INTRAVENOUS
  Filled 2016-06-11: qty 42

## 2016-06-11 MED ORDER — SODIUM CHLORIDE 0.9 % IV SOLN
Freq: Once | INTRAVENOUS | Status: AC
  Administered 2016-06-11: 10:00:00 via INTRAVENOUS
  Filled 2016-06-11: qty 4

## 2016-06-11 MED ORDER — DIPHENHYDRAMINE HCL 50 MG/ML IJ SOLN
25.0000 mg | Freq: Once | INTRAMUSCULAR | Status: AC
  Administered 2016-06-11: 25 mg via INTRAVENOUS

## 2016-06-11 MED ORDER — PACLITAXEL CHEMO INJECTION 300 MG/50ML: 175.0000 mg/m2 | Freq: Once | INTRAVENOUS | Status: DC

## 2016-06-11 MED ORDER — FAMOTIDINE IN NACL 20-0.9 MG/50ML-% IV SOLN
20.0000 mg | Freq: Once | INTRAVENOUS | Status: AC
  Administered 2016-06-11: 20 mg via INTRAVENOUS

## 2016-06-11 MED ORDER — SODIUM CHLORIDE 0.9 % IV SOLN
Freq: Once | INTRAVENOUS | Status: AC
  Administered 2016-06-11: 09:00:00 via INTRAVENOUS

## 2016-06-11 MED ORDER — SODIUM CHLORIDE 0.9 % IV SOLN
175.0000 mg/m2 | Freq: Once | INTRAVENOUS | Status: AC
  Administered 2016-06-11: 342 mg via INTRAVENOUS
  Filled 2016-06-11: qty 57

## 2016-06-11 MED ORDER — SODIUM CHLORIDE 0.9 % IV SOLN
Freq: Once | INTRAVENOUS | Status: AC
  Administered 2016-06-11: 10:00:00 via INTRAVENOUS
  Filled 2016-06-11: qty 5

## 2016-06-11 MED ORDER — FAMOTIDINE IN NACL 20-0.9 MG/50ML-% IV SOLN
INTRAVENOUS | Status: AC
  Filled 2016-06-11: qty 50

## 2016-06-11 NOTE — Patient Instructions (Addendum)
[facility] Discharge Instructions for Patients Receiving Chemotherapy  Today you received the following chemotherapy agents Taxol and Carboplatin  To help prevent nausea and vomiting after your treatment, we encourage you to take your nausea medication as directed.   If you develop nausea and vomiting that is not controlled by your nausea medication, call the clinic.   BELOW ARE SYMPTOMS THAT SHOULD BE REPORTED IMMEDIATELY:  *FEVER GREATER THAN 100.5 F  *CHILLS WITH OR WITHOUT FEVER  NAUSEA AND VOMITING THAT IS NOT CONTROLLED WITH YOUR NAUSEA MEDICATION  *UNUSUAL SHORTNESS OF BREATH  *UNUSUAL BRUISING OR BLEEDING  TENDERNESS IN MOUTH AND THROAT WITH OR WITHOUT PRESENCE OF ULCERS  *URINARY PROBLEMS  *BOWEL PROBLEMS  UNUSUAL RASH Items with * indicate a potential emergency and should be followed up as soon as possible.  Feel free to call the clinic you have any questions or concerns. The clinic phone number is [phone].  Please show the CHEMO ALERT CARD at check-in to the Emergency Department and triage nurse.      Paclitaxel injection What is this medicine? PACLITAXEL (PAK li TAX el) is a chemotherapy drug. It targets fast dividing cells, like cancer cells, and causes these cells to die. This medicine is used to treat ovarian cancer, breast cancer, and other cancers. This medicine may be used for other purposes; ask your health care provider or pharmacist if you have questions. What should I tell my health care provider before I take this medicine? They need to know if you have any of these conditions: -blood disorders -irregular heartbeat -infection (especially a virus infection such as chickenpox, cold sores, or herpes) -liver disease -previous or ongoing radiation therapy -an unusual or allergic reaction to paclitaxel, alcohol, polyoxyethylated castor oil, other chemotherapy agents, other medicines, foods, dyes, or preservatives -pregnant or  trying to get pregnant -breast-feeding How should I use this medicine? This drug is given as an infusion into a vein. It is administered in a hospital or clinic by a specially trained health care professional. Talk to your pediatrician regarding the use of this medicine in children. Special care may be needed. Overdosage: If you think you have taken too much of this medicine contact a poison control center or emergency room at once. NOTE: This medicine is only for you. Do not share this medicine with others. What if I miss a dose? It is important not to miss your dose. Call your doctor or health care professional if you are unable to keep an appointment. What may interact with this medicine? Do not take this medicine with any of the following medications: -disulfiram -metronidazole This medicine may also interact with the following medications: -cyclosporine -diazepam -ketoconazole -medicines to increase blood counts like filgrastim, pegfilgrastim, sargramostim -other chemotherapy drugs like cisplatin, doxorubicin, epirubicin, etoposide, teniposide, vincristine -quinidine -testosterone -vaccines -verapamil Talk to your doctor or health care professional before taking any of these medicines: -acetaminophen -aspirin -ibuprofen -ketoprofen -naproxen This list may not describe all possible interactions. Give your health care provider a list of all the medicines, herbs, non-prescription drugs, or dietary supplements you use. Also tell them if you smoke, drink alcohol, or use illegal drugs. Some items may interact with your medicine. What should I watch for while using this medicine? Your condition will be monitored carefully while you are receiving this medicine. You will need important blood work done while you are taking this medicine. This drug may make you feel generally unwell. This is not uncommon, as chemotherapy can affect   healthy cells as well as cancer cells. Report any side  effects. Continue your course of treatment even though you feel ill unless your doctor tells you to stop. This medicine can cause serious allergic reactions. To reduce your risk you will need to take other medicine(s) before treatment with this medicine. In some cases, you may be given additional medicines to help with side effects. Follow all directions for their use. Call your doctor or health care professional for advice if you get a fever, chills or sore throat, or other symptoms of a cold or flu. Do not treat yourself. This drug decreases your body's ability to fight infections. Try to avoid being around people who are sick. This medicine may increase your risk to bruise or bleed. Call your doctor or health care professional if you notice any unusual bleeding. Be careful brushing and flossing your teeth or using a toothpick because you may get an infection or bleed more easily. If you have any dental work done, tell your dentist you are receiving this medicine. Avoid taking products that contain aspirin, acetaminophen, ibuprofen, naproxen, or ketoprofen unless instructed by your doctor. These medicines may hide a fever. Do not become pregnant while taking this medicine. Women should inform their doctor if they wish to become pregnant or think they might be pregnant. There is a potential for serious side effects to an unborn child. Talk to your health care professional or pharmacist for more information. Do not breast-feed an infant while taking this medicine. Men are advised not to father a child while receiving this medicine. This product may contain alcohol. Ask your pharmacist or healthcare provider if this medicine contains alcohol. Be sure to tell all healthcare providers you are taking this medicine. Certain medicines, like metronidazole and disulfiram, can cause an unpleasant reaction when taken with alcohol. The reaction includes flushing, headache, nausea, vomiting, sweating, and increased  thirst. The reaction can last from 30 minutes to several hours. What side effects may I notice from receiving this medicine? Side effects that you should report to your doctor or health care professional as soon as possible: -allergic reactions like skin rash, itching or hives, swelling of the face, lips, or tongue -low blood counts - This drug may decrease the number of white blood cells, red blood cells and platelets. You may be at increased risk for infections and bleeding. -signs of infection - fever or chills, cough, sore throat, pain or difficulty passing urine -signs of decreased platelets or bleeding - bruising, pinpoint red spots on the skin, black, tarry stools, nosebleeds -signs of decreased red blood cells - unusually weak or tired, fainting spells, lightheadedness -breathing problems -chest pain -high or low blood pressure -mouth sores -nausea and vomiting -pain, swelling, redness or irritation at the injection site -pain, tingling, numbness in the hands or feet -slow or irregular heartbeat -swelling of the ankle, feet, hands Side effects that usually do not require medical attention (report to your doctor or health care professional if they continue or are bothersome): -bone pain -complete hair loss including hair on your head, underarms, pubic hair, eyebrows, and eyelashes -changes in the color of fingernails -diarrhea -loosening of the fingernails -loss of appetite -muscle or joint pain -red flush to skin -sweating This list may not describe all possible side effects. Call your doctor for medical advice about side effects. You may report side effects to FDA at 1-800-FDA-1088. Where should I keep my medicine? This drug is given in a hospital or clinic and will   not be stored at home. NOTE: This sheet is a summary. It may not cover all possible information. If you have questions about this medicine, talk to your doctor, pharmacist, or health care provider.    2016,  [employer]vier/Gold Standard. (2015-05-03 13:02:56)     Carboplatin injection What is this medicine? CARBOPLATIN (KAR boe pla tin) is a chemotherapy drug. It targets fast dividing cells, like cancer cells, and causes these cells to die. This medicine is used to treat ovarian cancer and many other cancers. This medicine may be used for other purposes; ask your health care provider or pharmacist if you have questions. What should I tell my health care provider before I take this medicine? They need to know if you have any of these conditions: -blood disorders -hearing problems -kidney disease -recent or ongoing radiation therapy -an unusual or allergic reaction to carboplatin, cisplatin, other chemotherapy, other medicines, foods, dyes, or preservatives -pregnant or trying to get pregnant -breast-feeding How should I use this medicine? This drug is usually given as an infusion into a vein. It is administered in a hospital or clinic by a specially trained health care professional. Talk to your pediatrician regarding the use of this medicine in children. Special care may be needed. Overdosage: If you think you have taken too much of this medicine contact a poison control center or emergency room at once. NOTE: This medicine is only for you. Do not share this medicine with others. What if I miss a dose? It is important not to miss a dose. Call your doctor or health care professional if you are unable to keep an appointment. What may interact with this medicine? -medicines for seizures -medicines to increase blood counts like filgrastim, pegfilgrastim, sargramostim -some antibiotics like amikacin, gentamicin, neomycin, streptomycin, tobramycin -vaccines Talk to your doctor or health care professional before taking any of these medicines: -acetaminophen -aspirin -ibuprofen -ketoprofen -naproxen This list may not describe all possible interactions. Give your health care provider a list of all  the medicines, herbs, non-prescription drugs, or dietary supplements you use. Also tell them if you smoke, drink alcohol, or use illegal drugs. Some items may interact with your medicine. What should I watch for while using this medicine? Your condition will be monitored carefully while you are receiving this medicine. You will need important blood work done while you are taking this medicine. This drug may make you feel generally unwell. This is not uncommon, as chemotherapy can affect healthy cells as well as cancer cells. Report any side effects. Continue your course of treatment even though you feel ill unless your doctor tells you to stop. In some cases, you may be given additional medicines to help with side effects. Follow all directions for their use. Call your doctor or health care professional for advice if you get a fever, chills or sore throat, or other symptoms of a cold or flu. Do not treat yourself. This drug decreases your body's ability to fight infections. Try to avoid being around people who are sick. This medicine may increase your risk to bruise or bleed. Call your doctor or health care professional if you notice any unusual bleeding. Be careful brushing and flossing your teeth or using a toothpick because you may get an infection or bleed more easily. If you have any dental work done, tell your dentist you are receiving this medicine. Avoid taking products that contain aspirin, acetaminophen, ibuprofen, naproxen, or ketoprofen unless instructed by your doctor. These medicines may hide a fever. Do   not become pregnant while taking this medicine. Women should inform their doctor if they wish to become pregnant or think they might be pregnant. There is a potential for serious side effects to an unborn child. Talk to your health care professional or pharmacist for more information. Do not breast-feed an infant while taking this medicine. What side effects may I notice from receiving this  medicine? Side effects that you should report to your doctor or health care professional as soon as possible: -allergic reactions like skin rash, itching or hives, swelling of the face, lips, or tongue -signs of infection - fever or chills, cough, sore throat, pain or difficulty passing urine -signs of decreased platelets or bleeding - bruising, pinpoint red spots on the skin, black, tarry stools, nosebleeds -signs of decreased red blood cells - unusually weak or tired, fainting spells, lightheadedness -breathing problems -changes in hearing -changes in vision -chest pain -high blood pressure -low blood counts - This drug may decrease the number of white blood cells, red blood cells and platelets. You may be at increased risk for infections and bleeding. -nausea and vomiting -pain, swelling, redness or irritation at the injection site -pain, tingling, numbness in the hands or feet -problems with balance, talking, walking -trouble passing urine or change in the amount of urine Side effects that usually do not require medical attention (report to your doctor or health care professional if they continue or are bothersome): -hair loss -loss of appetite -metallic taste in the mouth or changes in taste This list may not describe all possible side effects. Call your doctor for medical advice about side effects. You may report side effects to FDA at 1-800-FDA-1088. Where should I keep my medicine? This drug is given in a hospital or clinic and will not be stored at home. NOTE: This sheet is a summary. It may not cover all possible information. If you have questions about this medicine, talk to your doctor, pharmacist, or health care provider.    2016, [employer]vier/Gold Standard. (2007-12-21 14:38:05)   

## 2016-06-12 ENCOUNTER — Encounter: Payer: Self-pay | Admitting: Oncology

## 2016-06-12 ENCOUNTER — Telehealth: Payer: Self-pay

## 2016-06-12 NOTE — Telephone Encounter (Signed)
Spoke with Kayla Guerrero .  She is doing well after her treatment.  No N/V  Eating and drinking fluids well. Bowels are moving well. Reminded her to take Claritin daily for the next week to help with the taxol aches.  Told her that the Ativan was not approved by her insurance.  [name] stated that she paid out of pocket ~ $13.00 for prescription so she has it on hand if needed. Did not sent in compazine tablets to replace Ativan prescription. [name] knows to call [phone] if she has any questions or concerns .

## 2016-06-12 NOTE — Progress Notes (Signed)
Medical Oncology  [employer] denied prior auth request for lorazepam as antiemetic. Will try compazine 10 mg q 6 hr prn nausea # 30.  Godfrey Pick, MD

## 2016-06-12 NOTE — Telephone Encounter (Signed)
-----   Message from Egbert Garibaldi, RN sent at 06/11/2016  3:38 PM EDT ----- Regarding: Dr. Marko Plume chemo f/u call  Pt of Dr. Marko Plume First time 3 hour taxol and carboplatin. Pt tolerated treatment well.

## 2016-06-13 ENCOUNTER — Telehealth: Payer: Self-pay

## 2016-06-13 ENCOUNTER — Other Ambulatory Visit: Payer: Self-pay

## 2016-06-13 DIAGNOSIS — C541 Malignant neoplasm of endometrium: Secondary | ICD-10-CM

## 2016-06-13 NOTE — Telephone Encounter (Signed)
Pt called asking about pain after chemo. The pain started last night and seems to have settled in her knees. She is taking claritin and taking hot showers, using heating pad. She was asking about aleve. Stated she could use aleve and wait about an hour if that doesn't help she can use the percocet on her MAR. She has drank approx 32 oz water so far today. Encouraged her to continue water.

## 2016-06-15 ENCOUNTER — Other Ambulatory Visit: Payer: Self-pay | Admitting: Oncology

## 2016-06-15 DIAGNOSIS — C541 Malignant neoplasm of endometrium: Secondary | ICD-10-CM

## 2016-06-16 ENCOUNTER — Ambulatory Visit (HOSPITAL_BASED_OUTPATIENT_CLINIC_OR_DEPARTMENT_OTHER): Payer: Medicare Other | Admitting: Oncology

## 2016-06-16 ENCOUNTER — Other Ambulatory Visit (HOSPITAL_BASED_OUTPATIENT_CLINIC_OR_DEPARTMENT_OTHER): Payer: Medicare Other

## 2016-06-16 ENCOUNTER — Encounter: Payer: Self-pay | Admitting: Oncology

## 2016-06-16 VITALS — BP 130/72 | HR 89 | Temp 98.2°F | Resp 18 | Ht 66.0 in | Wt 173.2 lb

## 2016-06-16 DIAGNOSIS — M792 Neuralgia and neuritis, unspecified: Secondary | ICD-10-CM | POA: Diagnosis not present

## 2016-06-16 DIAGNOSIS — C541 Malignant neoplasm of endometrium: Secondary | ICD-10-CM

## 2016-06-16 DIAGNOSIS — E039 Hypothyroidism, unspecified: Secondary | ICD-10-CM

## 2016-06-16 DIAGNOSIS — N289 Disorder of kidney and ureter, unspecified: Secondary | ICD-10-CM

## 2016-06-16 DIAGNOSIS — G629 Polyneuropathy, unspecified: Secondary | ICD-10-CM

## 2016-06-16 LAB — CBC WITH DIFFERENTIAL/PLATELET
BASO%: 0.8 % (ref 0.0–2.0)
Basophils Absolute: 0 10*3/uL (ref 0.0–0.1)
EOS%: 19.7 % — AB (ref 0.0–7.0)
Eosinophils Absolute: 0.8 10*3/uL — ABNORMAL HIGH (ref 0.0–0.5)
HEMATOCRIT: 39.9 % (ref 34.8–46.6)
HEMOGLOBIN: 13.2 g/dL (ref 11.6–15.9)
LYMPH#: 1.2 10*3/uL (ref 0.9–3.3)
LYMPH%: 29.8 % (ref 14.0–49.7)
MCH: 29 pg (ref 25.1–34.0)
MCHC: 33.1 g/dL (ref 31.5–36.0)
MCV: 87.4 fL (ref 79.5–101.0)
MONO#: 0 10*3/uL — ABNORMAL LOW (ref 0.1–0.9)
MONO%: 1 % (ref 0.0–14.0)
NEUT#: 2 10*3/uL (ref 1.5–6.5)
NEUT%: 48.7 % (ref 38.4–76.8)
Platelets: 299 10*3/uL (ref 145–400)
RBC: 4.57 10*6/uL (ref 3.70–5.45)
RDW: 13.5 % (ref 11.2–14.5)
WBC: 4 10*3/uL (ref 3.9–10.3)

## 2016-06-16 LAB — COMPREHENSIVE METABOLIC PANEL
ALK PHOS: 63 U/L (ref 40–150)
ALT: 11 U/L (ref 0–55)
ANION GAP: 9 meq/L (ref 3–11)
AST: 20 U/L (ref 5–34)
Albumin: 3.2 g/dL — ABNORMAL LOW (ref 3.5–5.0)
BILIRUBIN TOTAL: 0.49 mg/dL (ref 0.20–1.20)
BUN: 17.6 mg/dL (ref 7.0–26.0)
CALCIUM: 9.2 mg/dL (ref 8.4–10.4)
CO2: 27 mEq/L (ref 22–29)
CREATININE: 0.9 mg/dL (ref 0.6–1.1)
Chloride: 103 mEq/L (ref 98–109)
EGFR: 63 mL/min/{1.73_m2} — ABNORMAL LOW (ref >90)
Glucose: 92 mg/dl (ref 70–140)
Potassium: 4.3 mEq/L (ref 3.5–5.1)
Sodium: 139 mEq/L (ref 136–145)
Total Protein: 7.1 g/dL (ref 6.4–8.3)

## 2016-06-16 NOTE — Progress Notes (Signed)
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 She has had a difficult time with taxol aches and constipation thus far.   Patient needed 3 attempts by 2 RNs for IV start, but otherwise had no problems during chemo infusion and felt well on day 2 until aches began that evening, primarily in knees. She used claritin, heat and had some improvement with percocet left from surgery. Bowels had not moved in several days prior to small bowel movement on 9-17; she will increase miralax and senokotS 2 tablets from daily to bid now. She has had some unsettled stomach, not marked nausea, antiemetics "a few times" helpful. She has been able to eat soup, crackers, milkshake and some fluids. She was able to sleep with percocet and ativan at hs. She has been fatigued, staying "mostly on couch". She denies SOB, bleeding, fever or symptoms of infection, LE swelling. 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 Remainder of 10 point Review of Systems negative.     ONCOLOGIC HISTORY Patient presented to PCP with new vaginal discharge and some vaginal spotting, referred to Dr Dory Horn, whom she had known previously. D&C on 04-09-16 had high grade carcinoma favoring serous histology [phone]). She was referred to gyn oncology, saw Dr Denman George on 04-23-16. CT CAP 04-30-16 no evidence of metastatic disease, no pelvic adenopathy, no ascites, thickened endometrial canal. On 05-06-16 Dr Alycia Rossetti performed robotic hysterectomy BSO bilateral pelvic and paraaortic node evaluation (failed sentinel LN mapping); patient was DC home on 05-07-16.  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  Post operative course was remarkable for acute onset left groin and thigh pain ~ 6 days post op, with repeat CT 05-16-16 without findings specific for those complaints; she was treated with neurontin and percocet, tapering now as symptoms have progressively improved. First carboplatin taxol given 06-11-16.     Objective:  Vital signs in last 24 hours:  BP 130/72 (BP Location: Right Arm, Patient Position: Sitting)   Pulse 89   Temp 98.2 F (36.8 C) (Oral)   Resp 18   Ht 5\' 6"  (1.676 m)   Wt 173 lb 3.2 oz (78.6 kg)   SpO2 99%   BMI 27.96 kg/m  Weight down 5 lbs. A little tearful, upset but does not appear in acute discomfort. Alert, oriented and cooperative. Ambulatory without assistance, able to get on and off exam table..  No alopecia  HEENT:PERRL, sclerae not icteric. Oral mucosa moist without lesions, posterior pharynx clear.  Neck supple. No JVD.  Lymphatics:no cervical,supraclavicular adenopathy Resp: clear to auscultation bilaterally and normal percussion bilaterally Cardio: regular rate and rhythm. No gallop. GI: soft, nontender, not distended, no mass or organomegaly. Decreased bowel sounds. Surgical incisions not remarkable. Musculoskeletal/ Extremities: LE without pitting edema, cords, tenderness Neuro: no peripheral neuropathy. Otherwise nonfocal Skin Resolving ecchymosis left medial forearm at site of attempted IV access, no cords or streaking. Otherwise skin without rash,petechiae   Lab Results:  Results for orders placed or performed in visit on 06/16/16  CBC with Differential  Result Value Ref Range  WBC 4.0 3.9 - 10.3 10e3/uL   NEUT# 2.0 1.5 - 6.5 10e3/uL   HGB 13.2 11.6 - 15.9 g/dL   HCT 39.9 34.8 - 46.6 %   Platelets 299 145 - 400 10e3/uL   MCV 87.4 79.5 - 101.0 fL   MCH 29.0 25.1 - 34.0 pg    MCHC 33.1 31.5 - 36.0 g/dL   RBC 4.57 3.70 - 5.45 10e6/uL   RDW 13.5 11.2 - 14.5 %   lymph# 1.2 0.9 - 3.3 10e3/uL   MONO# 0.0 (L) 0.1 - 0.9 10e3/uL   Eosinophils Absolute 0.8 (H) 0.0 - 0.5 10e3/uL   Basophils Absolute 0.0 0.0 - 0.1 10e3/uL   NEUT% 48.7 38.4 - 76.8 %   LYMPH% 29.8 14.0 - 49.7 %   MONO% 1.0 0.0 - 14.0 %   EOS% 19.7 (H) 0.0 - 7.0 %   BASO% 0.8 0.0 - 2.0 %   CMET normal with exception of albumin 3.2, including creatinine 0.9.  CBC reviewed at time of visit, counts not yet at nadir so will follow up on 9-21.  Studies/Results:  No results found.  Medications: I have reviewed the patient's current medications. Increase miralax and senokot S to bid until bowels moving well; likely will need to begin laxatives closer to chemo with subsequent cycles. She has been able to continue weaning off pain medications for post op neuropathy.  DISCUSSION Interval history reviewed in detail as above. Taxol aches resolving and expect she will also feel better when constipation resolves. Patient may want to consider changing to weekly taxol, however does not need to make this decision today and still may prefer a few days of symptoms with 1-2 weeks feeling fairly well on the present every 3 week regimen. I have mentioned possible gCSF depending on counts later this week.  Patient and husband prefer appointments on Tuesdays and Thursdays as possible, as they have water exercise and house cleaning on other days. I have cancelled appointment with APP on Mon 9-25 and will see her instead on Thurs 9-21 with lab.    Mentioned PAC. Difficult IV access noted on chemo orders.  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 Adjuvant carboplatin taxol cycle 1 06-11-16, counts not at nadir, follow up as above.  Consider vaginal brachytherapy after chemotherapy 2.neuropathic pain left inguinal area postoperatively,  no clear etiology on repeat CT AP then. Improved 3.Indeterminate right renal lesion on CT 4.hypothyroid on replacement by PCP 5.post cholecystectomy and remote BTL 6.past GERD 7.Up to date mammograms and colonoscopy 8.Atherosclerotic thoracic and abdominal aorta by CT 9.Degenerative arthritis spine 10.intentional weight loss in past year, ~ 40 lbs 11.difficult IV access: may need central line   All questions answered and they know to call prior to follow up visit if needed. Time spent 25 min including >50% counseling and coordination of care.   Evlyn Clines, MD   06/16/2016, 9:13 AM

## 2016-06-18 ENCOUNTER — Other Ambulatory Visit: Payer: Self-pay | Admitting: Oncology

## 2016-06-18 DIAGNOSIS — C541 Malignant neoplasm of endometrium: Secondary | ICD-10-CM

## 2016-06-19 ENCOUNTER — Other Ambulatory Visit (HOSPITAL_BASED_OUTPATIENT_CLINIC_OR_DEPARTMENT_OTHER): Payer: Medicare Other

## 2016-06-19 ENCOUNTER — Encounter: Payer: Self-pay | Admitting: Oncology

## 2016-06-19 ENCOUNTER — Ambulatory Visit (HOSPITAL_BASED_OUTPATIENT_CLINIC_OR_DEPARTMENT_OTHER): Payer: Medicare Other | Admitting: Oncology

## 2016-06-19 VITALS — BP 132/78 | HR 80 | Temp 98.8°F | Resp 18 | Ht 66.0 in | Wt 173.5 lb

## 2016-06-19 DIAGNOSIS — T451X5A Adverse effect of antineoplastic and immunosuppressive drugs, initial encounter: Secondary | ICD-10-CM

## 2016-06-19 DIAGNOSIS — C541 Malignant neoplasm of endometrium: Secondary | ICD-10-CM | POA: Diagnosis not present

## 2016-06-19 DIAGNOSIS — N289 Disorder of kidney and ureter, unspecified: Secondary | ICD-10-CM

## 2016-06-19 DIAGNOSIS — M479 Spondylosis, unspecified: Secondary | ICD-10-CM

## 2016-06-19 DIAGNOSIS — E039 Hypothyroidism, unspecified: Secondary | ICD-10-CM

## 2016-06-19 DIAGNOSIS — D701 Agranulocytosis secondary to cancer chemotherapy: Secondary | ICD-10-CM

## 2016-06-19 DIAGNOSIS — G629 Polyneuropathy, unspecified: Secondary | ICD-10-CM

## 2016-06-19 DIAGNOSIS — I878 Other specified disorders of veins: Secondary | ICD-10-CM

## 2016-06-19 LAB — CBC WITH DIFFERENTIAL/PLATELET
BASO%: 2 % (ref 0.0–2.0)
Basophils Absolute: 0.1 10*3/uL (ref 0.0–0.1)
EOS ABS: 0.2 10*3/uL (ref 0.0–0.5)
EOS%: 8.7 % — ABNORMAL HIGH (ref 0.0–7.0)
HCT: 34.9 % (ref 34.8–46.6)
HGB: 11.5 g/dL — ABNORMAL LOW (ref 11.6–15.9)
LYMPH%: 52.9 % — AB (ref 14.0–49.7)
MCH: 28.6 pg (ref 25.1–34.0)
MCHC: 33.1 g/dL (ref 31.5–36.0)
MCV: 86.6 fL (ref 79.5–101.0)
MONO#: 0.1 10*3/uL (ref 0.1–0.9)
MONO%: 4.6 % (ref 0.0–14.0)
NEUT%: 31.8 % — AB (ref 38.4–76.8)
NEUTROS ABS: 0.9 10*3/uL — AB (ref 1.5–6.5)
Platelets: 284 10*3/uL (ref 145–400)
RBC: 4.03 10*6/uL (ref 3.70–5.45)
RDW: 13 % (ref 11.2–14.5)
WBC: 2.7 10*3/uL — AB (ref 3.9–10.3)
lymph#: 1.4 10*3/uL (ref 0.9–3.3)

## 2016-06-19 MED ORDER — TBO-FILGRASTIM 300 MCG/0.5ML ~~LOC~~ SOSY
300.0000 ug | PREFILLED_SYRINGE | Freq: Once | SUBCUTANEOUS | Status: AC
  Administered 2016-06-19: 300 ug via SUBCUTANEOUS
  Filled 2016-06-19: qty 0.5

## 2016-06-19 NOTE — Progress Notes (Signed)
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 She is neutropenic today (day 9 cycle 10 with ANC 0.9, but is not febrile.  Patient has felt some better since she was last here on 06-16-16, with taxol aches almost completely resolved, good bowel movement yesterday, appetite improved, no nausea now. She slept better last pm tho did need gabapentin 100 mg for slight increase in post op neuropathy then. She denies increased fatigue, has no symptoms of infection, has had no fever. She denies SOB, new or different pain, significant peripheral neuropathy, abdominal or pelvic pain, bleeding, swelling LE. The bruising LUE at site of attempted IV access is improving. Remainder of 10 point Review of Systems negative.   ONCOLOGIC HISTORY Patient presented to PCP with new vaginal discharge and some vaginal spotting, referred to Dr Dory Horn, whom she had known previously. D&C on 04-09-16 had high grade carcinoma favoring serous histology [phone]). She was referred to gyn oncology, saw Dr Denman George on 04-23-16. CT CAP 04-30-16 no evidence of metastatic disease, no pelvic adenopathy, no ascites, thickened endometrial canal. On 05-06-16 Dr Alycia Rossetti performed robotic hysterectomy BSO bilateral pelvic and paraaortic node evaluation (failed sentinel LN mapping); patient was DC home on 05-07-16. 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 Post operative course was remarkable for acute onset left groin and thigh pain ~ 6 days post op, with repeat CT 05-16-16  without findings specific for those complaints; she was treated with neurontin and percocet, tapering now as symptoms have progressively improved. First carboplatin taxol given 06-11-16, neutropenic with ANC 0.9 on day 9 cycle 1.   Objective:  Vital signs in last 24 hours:  BP 132/78 (BP Location: Right Arm, Patient Position: Sitting)   Pulse 80   Temp 98.8 F (37.1 C) (Oral)   Resp 18   Ht 5\' 6"  (1.676 m)   Wt 173 lb 8 oz (78.7 kg)   SpO2 99%   BMI 28.00 kg/m  Weight stable. Alert, oriented and appropriate. Ambulatory without difficulty.  No alopecia  HEENT:PERRL, sclerae not icteric. Oral mucosa moist without lesions, posterior pharynx clear.  Neck supple. No JVD.  Lymphatics:no cervical,supraclavicular adenopathy Resp: clear to auscultation bilaterally and normal percussion bilaterally Cardio: regular rate and rhythm. No gallop. GI: soft, nontender, not distended, no mass or organomegaly. Normally active bowel sounds. Surgical incisions not remarkable. Musculoskeletal/ Extremities: UE/ LE without pitting edema, cords, tenderness Neuro: no peripheral neuropathy. Otherwise nonfocal. PSYCH brighter affect today Skin without rash, petechiae. Ecchymosis left forearm 50% smaller, no streaking or cords.   Lab Results:  Results for orders placed or performed in visit on 06/19/16  CBC with Differential  Result Value Ref Range   WBC 2.7 (L) 3.9 - 10.3 10e3/uL   NEUT# 0.9 (L) 1.5 - 6.5 10e3/uL   HGB 11.5 (L) 11.6 - 15.9 g/dL   HCT 34.9 34.8 - 46.6 %   Platelets 284 145 - 400 10e3/uL   MCV 86.6 79.5 - 101.0 fL   MCH 28.6 25.1 - 34.0 pg   MCHC 33.1 31.5 - 36.0 g/dL   RBC 4.03 3.70 -  5.45 10e6/uL   RDW 13.0 11.2 - 14.5 %   lymph# 1.4 0.9 - 3.3 10e3/uL   MONO# 0.1 0.1 - 0.9 10e3/uL   Eosinophils Absolute 0.2 0.0 - 0.5 10e3/uL   Basophils Absolute 0.1 0.0 - 0.1 10e3/uL   NEUT% 31.8 (L) 38.4 - 76.8 %   LYMPH% 52.9 (H) 14.0 - 49.7 %   MONO% 4.6 0.0 - 14.0 %   EOS% 8.7 (H) 0.0 -  7.0 %   BASO% 2.0 0.0 - 2.0 %     Studies/Results:  No results found.  Medications: I have reviewed the patient's current medications. Granix 9-21,22, 23 and possibly additional depending on repeat CBC on 9-25 (see below) She has stopped pain medication and had not used gabapentin in several days prior to last pm. Flu vaccine on 9-25 if not neutropenic.  DISCUSSION Neutropenic today: neutropenic precautions discussed and will begin Granix 300 mcg 9-21, 22,23. Patient is reluctant to change planned activities, including meal in restaurant later today, however understands that this may put her at more risk for infection.  Recheck CBC on 9-25. If ANC <1.2 repeat granix then. TIming of lab on 9-25 coordinated with husband's water exercise schedule.  If no longer neutropenic by 9-25, needs flu vaccine that day.  DIscussed possible aches from gCSF stimulation of bone marrow. Recommended that she resume claritin today.  Message to collaborative RNs and injection RN: "Recheck CBC on 9-25 If ANC >= 1.2 no granix but give flu vaccine If ANC <1.2 repeat granix and do NOT give flu vaccine If [place] <=1.0 on 9-25, also needs granix on 9-26 and ask LL for further orders."  Other than counts, she is doing much better given a few more days out from chemo. She would like to continue every 3 week dosing and does not want PAC if peripheral IV access is possible, tho understands that she will need central catheter if peripheral access is not adequate.  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 Adjuvant carboplatin taxol begun 06-11-16, counts may not be at nadir but is neutropenic now. Add granix, recheck CBC on 06-23-16.  Consider vaginal brachytherapy after chemotherapy 2.neuropathic pain left inguinal area  postoperatively, no clear etiology on repeat CT AP then. Improved 3.Indeterminate right renal lesion on CT 4.hypothyroid on replacement by PCP 5.post cholecystectomy and remote BTL 6.past GERD 7.Up to date mammograms and colonoscopy 8.Atherosclerotic thoracic and abdominal aorta by CT 9.Degenerative arthritis spine 10.intentional weight loss in past year, ~ 40 lbs 11.difficult IV access: may need central line   All questions answered and patient / husband understand recommendations and plans, know they should call for temp >=100.5 or symptoms of infection while neutropenic.I will see her with labs 10-2 prior to cycle 2 chemo on 10-5. She will need granix after subsequent cycles, delay a few days due to taxol aches. Route PCP, cc Drs Alycia Rossetti and Nori Riis to update  Time spent 25 min including >50% counseling and coordination of care.  Evlyn Clines, MD   06/20/2016, 1:03 PM

## 2016-06-20 ENCOUNTER — Ambulatory Visit (HOSPITAL_BASED_OUTPATIENT_CLINIC_OR_DEPARTMENT_OTHER): Payer: Medicare Other

## 2016-06-20 VITALS — BP 143/64 | HR 87 | Temp 98.2°F | Resp 20

## 2016-06-20 DIAGNOSIS — D701 Agranulocytosis secondary to cancer chemotherapy: Secondary | ICD-10-CM | POA: Diagnosis not present

## 2016-06-20 DIAGNOSIS — T451X5A Adverse effect of antineoplastic and immunosuppressive drugs, initial encounter: Secondary | ICD-10-CM

## 2016-06-20 DIAGNOSIS — C541 Malignant neoplasm of endometrium: Secondary | ICD-10-CM | POA: Diagnosis not present

## 2016-06-20 DIAGNOSIS — I878 Other specified disorders of veins: Secondary | ICD-10-CM | POA: Insufficient documentation

## 2016-06-20 MED ORDER — TBO-FILGRASTIM 300 MCG/0.5ML ~~LOC~~ SOSY
300.0000 ug | PREFILLED_SYRINGE | Freq: Once | SUBCUTANEOUS | Status: AC
  Administered 2016-06-20: 300 ug via SUBCUTANEOUS
  Filled 2016-06-20: qty 0.5

## 2016-06-20 NOTE — Patient Instructions (Signed)

## 2016-06-21 ENCOUNTER — Ambulatory Visit (HOSPITAL_BASED_OUTPATIENT_CLINIC_OR_DEPARTMENT_OTHER): Payer: Medicare Other

## 2016-06-21 VITALS — BP 141/64 | HR 81 | Temp 98.9°F | Resp 18

## 2016-06-21 DIAGNOSIS — C701 Malignant neoplasm of spinal meninges: Secondary | ICD-10-CM

## 2016-06-21 DIAGNOSIS — C541 Malignant neoplasm of endometrium: Secondary | ICD-10-CM

## 2016-06-21 MED ORDER — TBO-FILGRASTIM 300 MCG/0.5ML ~~LOC~~ SOSY
300.0000 ug | PREFILLED_SYRINGE | Freq: Once | SUBCUTANEOUS | Status: AC
Start: 2016-06-21 — End: 2016-06-21
  Administered 2016-06-21: 300 ug via SUBCUTANEOUS

## 2016-06-21 NOTE — Patient Instructions (Signed)

## 2016-06-23 ENCOUNTER — Ambulatory Visit: Payer: Medicare Other | Admitting: Oncology

## 2016-06-23 ENCOUNTER — Ambulatory Visit (HOSPITAL_BASED_OUTPATIENT_CLINIC_OR_DEPARTMENT_OTHER): Payer: Medicare Other

## 2016-06-23 ENCOUNTER — Other Ambulatory Visit: Payer: Medicare Other

## 2016-06-23 ENCOUNTER — Other Ambulatory Visit (HOSPITAL_BASED_OUTPATIENT_CLINIC_OR_DEPARTMENT_OTHER): Payer: Medicare Other

## 2016-06-23 VITALS — HR 77 | Temp 97.8°F | Resp 18

## 2016-06-23 DIAGNOSIS — Z23 Encounter for immunization: Secondary | ICD-10-CM | POA: Diagnosis not present

## 2016-06-23 DIAGNOSIS — C541 Malignant neoplasm of endometrium: Secondary | ICD-10-CM

## 2016-06-23 DIAGNOSIS — M8580 Other specified disorders of bone density and structure, unspecified site: Secondary | ICD-10-CM

## 2016-06-23 DIAGNOSIS — E039 Hypothyroidism, unspecified: Secondary | ICD-10-CM

## 2016-06-23 LAB — CBC WITH DIFFERENTIAL/PLATELET
BASO%: 1.1 % (ref 0.0–2.0)
BASOS ABS: 0.1 10*3/uL (ref 0.0–0.1)
EOS ABS: 0.3 10*3/uL (ref 0.0–0.5)
EOS%: 3.8 % (ref 0.0–7.0)
HCT: 36.7 % (ref 34.8–46.6)
HGB: 12 g/dL (ref 11.6–15.9)
LYMPH%: 19.8 % (ref 14.0–49.7)
MCH: 28.6 pg (ref 25.1–34.0)
MCHC: 32.8 g/dL (ref 31.5–36.0)
MCV: 87.1 fL (ref 79.5–101.0)
MONO#: 0.8 10*3/uL (ref 0.1–0.9)
MONO%: 9.4 % (ref 0.0–14.0)
NEUT#: 5.7 10*3/uL (ref 1.5–6.5)
NEUT%: 65.9 % (ref 38.4–76.8)
Platelets: 278 10*3/uL (ref 145–400)
RBC: 4.21 10*6/uL (ref 3.70–5.45)
RDW: 13.6 % (ref 11.2–14.5)
WBC: 8.6 10*3/uL (ref 3.9–10.3)
lymph#: 1.7 10*3/uL (ref 0.9–3.3)

## 2016-06-23 MED ORDER — INFLUENZA VAC SPLIT QUAD 0.5 ML IM SUSY
0.5000 mL | PREFILLED_SYRINGE | Freq: Once | INTRAMUSCULAR | Status: AC
  Administered 2016-06-23: 0.5 mL via INTRAMUSCULAR
  Filled 2016-06-23: qty 0.5

## 2016-06-23 NOTE — Patient Instructions (Signed)

## 2016-06-29 ENCOUNTER — Other Ambulatory Visit: Payer: Self-pay | Admitting: Oncology

## 2016-06-30 ENCOUNTER — Other Ambulatory Visit (HOSPITAL_BASED_OUTPATIENT_CLINIC_OR_DEPARTMENT_OTHER): Payer: Medicare Other

## 2016-06-30 ENCOUNTER — Ambulatory Visit (HOSPITAL_BASED_OUTPATIENT_CLINIC_OR_DEPARTMENT_OTHER): Payer: Medicare Other | Admitting: Oncology

## 2016-06-30 ENCOUNTER — Other Ambulatory Visit: Payer: Self-pay

## 2016-06-30 VITALS — BP 136/71 | HR 85 | Temp 97.7°F | Resp 18 | Ht 66.0 in | Wt 174.0 lb

## 2016-06-30 DIAGNOSIS — C541 Malignant neoplasm of endometrium: Secondary | ICD-10-CM

## 2016-06-30 DIAGNOSIS — T451X5A Adverse effect of antineoplastic and immunosuppressive drugs, initial encounter: Secondary | ICD-10-CM

## 2016-06-30 DIAGNOSIS — I878 Other specified disorders of veins: Secondary | ICD-10-CM | POA: Diagnosis not present

## 2016-06-30 DIAGNOSIS — D701 Agranulocytosis secondary to cancer chemotherapy: Secondary | ICD-10-CM | POA: Diagnosis not present

## 2016-06-30 DIAGNOSIS — G620 Drug-induced polyneuropathy: Secondary | ICD-10-CM | POA: Diagnosis not present

## 2016-06-30 LAB — COMPREHENSIVE METABOLIC PANEL
ANION GAP: 10 meq/L (ref 3–11)
AST: 15 U/L (ref 5–34)
Albumin: 3.1 g/dL — ABNORMAL LOW (ref 3.5–5.0)
Alkaline Phosphatase: 70 U/L (ref 40–150)
BILIRUBIN TOTAL: 0.37 mg/dL (ref 0.20–1.20)
BUN: 11.3 mg/dL (ref 7.0–26.0)
CALCIUM: 9 mg/dL (ref 8.4–10.4)
CHLORIDE: 105 meq/L (ref 98–109)
CO2: 25 mEq/L (ref 22–29)
CREATININE: 0.8 mg/dL (ref 0.6–1.1)
EGFR: 72 mL/min/{1.73_m2} — AB (ref >90)
Glucose: 86 mg/dl (ref 70–140)
Potassium: 4.8 mEq/L (ref 3.5–5.1)
SODIUM: 140 meq/L (ref 136–145)
TOTAL PROTEIN: 6.7 g/dL (ref 6.4–8.3)

## 2016-06-30 LAB — CBC WITH DIFFERENTIAL/PLATELET
BASO%: 0.2 % (ref 0.0–2.0)
Basophils Absolute: 0 10*3/uL (ref 0.0–0.1)
EOS%: 1.8 % (ref 0.0–7.0)
Eosinophils Absolute: 0.1 10*3/uL (ref 0.0–0.5)
HCT: 35.1 % (ref 34.8–46.6)
HGB: 11.8 g/dL (ref 11.6–15.9)
LYMPH%: 31.4 % (ref 14.0–49.7)
MCH: 28.8 pg (ref 25.1–34.0)
MCHC: 33.6 g/dL (ref 31.5–36.0)
MCV: 85.6 fL (ref 79.5–101.0)
MONO#: 0.3 10*3/uL (ref 0.1–0.9)
MONO%: 7.6 % (ref 0.0–14.0)
NEUT%: 59 % (ref 38.4–76.8)
NEUTROS ABS: 2.6 10*3/uL (ref 1.5–6.5)
Platelets: 176 10*3/uL (ref 145–400)
RBC: 4.1 10*6/uL (ref 3.70–5.45)
RDW: 14.3 % (ref 11.2–14.5)
WBC: 4.3 10*3/uL (ref 3.9–10.3)
lymph#: 1.4 10*3/uL (ref 0.9–3.3)

## 2016-06-30 NOTE — Progress Notes (Signed)
OFFICE PROGRESS NOTE   July 02, 2016   Physicians: Nancy Marus Everitt Amber, Tomi Bamberger, Eve (PCP),Ron Hilaria Ota [place], [place].Alucio, Monna Fam (ophth), Szott (dentist), Hiatt (podiatry)  INTERVAL HISTORY:  Patient is seen, together with husband, in continuing attention to adjuvant chemotherapy ongoing for IB serous carcinoms of endometrium. She had cycle 1 carbo taxol using every 3 week regimen on 06-16-16, with neutropenia day 9 (ANC 0.9) and granix added x 3 beginning day 9. She is due cycle 2 on 07-03-16.  She has follow up with Dr Alycia Rossetti on 07-02-16 Plan is to consider vaginal brachytherapy after chemo depending on situation.  In addition to chemo neutropenia, cycle 1 was complicated by taxol aches ~ days 2-6,  and constipation. Patient will begin claritin day of chemo and continue for at least 5-7 days for aching. Percocet and heat were helpful for the aches, and she understands that she may have to increase bowel regimen if uses percocet. Bowels are moving well with some miralax, which she likely needs to use day prior to chemo and then daily for the next several days. Appetite is good now. Energy has been good all of this past week, including going to symphony. She still prefers peripheral IV access to [facility] if possible, will be well hydrated when she comes for treatment. She denies SOB, fever or symptoms of infection, problems with bowels, bleeding, swelling LE. Slight peripheral neuropathy toes, not bothersome other than at night when makes difficult to fall asleep. Minimal residual neuropathy inguinal areas bilaterally, has not needed gabapentin this week. She has difficulty sleeping, ativan at hs helpful but she may try gabapentin at hs instead due to symptoms with feet. Remainder of 10 point Review of Systems negative   No central catheter Flu vaccine 06-23-16 No genetics testing    ONCOLOGIC HISTORY Patient presented to PCP with new vaginal discharge and some vaginal spotting,  referred to Dr Dory Horn, whom she had known previously. D&C on 04-09-16 had high grade carcinoma favoring serous histology [phone]). She was referred to gyn oncology, saw Dr Denman George on 04-23-16. CT CAP 04-30-16 no evidence of metastatic disease, no pelvic adenopathy, no ascites, thickened endometrial canal. On 05-06-16 Dr Alycia Rossetti performed robotic hysterectomy BSO bilateral pelvic and paraaortic node evaluation (failed sentinel LN mapping); patient was DC home on 05-07-16. 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 Post operative course was remarkable for acute onset left groin and thigh pain ~ 6 days post op, with repeat CT 05-16-16 without findings specific for those complaints; she was treated with neurontin and percocet, tapering now as symptoms have progressively improved. First carboplatin taxol given 06-11-16, neutropenic with ANC 0.9 on day 9 cycle 1.  Objective:  Vital signs in last 24 hours:  BP 136/71 (BP Location: Left Arm, Patient Position: Sitting)   Pulse 85   Temp 97.7 F (36.5 C) (Oral)   Resp 18   Ht '5\' 6"'$  (1.676 m)   Wt 174 lb (78.9 kg)   SpO2 99%   BMI 28.08 kg/m  Weight stable Alert, oriented and appropriate. Ambulatory without difficulty.  Partial alopecia  HEENT:PERRL, sclerae not icteric. Oral mucosa moist without lesions, posterior pharynx clear.  Neck supple. No JVD.  Lymphatics:no cervical,supraclavicular or inguinal adenopathy Resp: clear to auscultation bilaterally and normal percussion bilaterally Cardio: regular rate and rhythm. No gallop. GI: soft, nontender,  not distended, no mass or organomegaly. Normally active bowel sounds. Surgical incisions not remarkable. Musculoskeletal/ Extremities: without pitting edema, cords, tenderness Neuro: minimal peripheral neuropathy toes. Otherwise nonfocal.  PSYCH appropriate mood and affect Skin without rash, ecchymosis, petechiae   Lab Results:  Results for orders placed or performed in visit on 06/30/16  CBC with Differential  Result Value Ref Range   WBC 4.3 3.9 - 10.3 10e3/uL   NEUT# 2.6 1.5 - 6.5 10e3/uL   HGB 11.8 11.6 - 15.9 g/dL   HCT 35.1 34.8 - 46.6 %   Platelets 176 145 - 400 10e3/uL   MCV 85.6 79.5 - 101.0 fL   MCH 28.8 25.1 - 34.0 pg   MCHC 33.6 31.5 - 36.0 g/dL   RBC 4.10 3.70 - 5.45 10e6/uL   RDW 14.3 11.2 - 14.5 %   lymph# 1.4 0.9 - 3.3 10e3/uL   MONO# 0.3 0.1 - 0.9 10e3/uL   Eosinophils Absolute 0.1 0.0 - 0.5 10e3/uL   Basophils Absolute 0.0 0.0 - 0.1 10e3/uL   NEUT% 59.0 38.4 - 76.8 %   LYMPH% 31.4 14.0 - 49.7 %   MONO% 7.6 0.0 - 14.0 %   EOS% 1.8 0.0 - 7.0 %   BASO% 0.2 0.0 - 2.0 %  Comprehensive metabolic panel  Result Value Ref Range   Sodium 140 136 - 145 mEq/L   Potassium 4.8 3.5 - 5.1 mEq/L   Chloride 105 98 - 109 mEq/L   CO2 25 22 - 29 mEq/L   Glucose 86 70 - 140 mg/dl   BUN 11.3 7.0 - 26.0 mg/dL   Creatinine 0.8 0.6 - 1.1 mg/dL   Total Bilirubin 0.37 0.20 - 1.20 mg/dL   Alkaline Phosphatase 70 40 - 150 U/L   AST 15 5 - 34 U/L   ALT <9 0 - 55 U/L   Total Protein 6.7 6.4 - 8.3 g/dL   Albumin 3.1 (L) 3.5 - 5.0 g/dL   Calcium 9.0 8.4 - 10.4 mg/dL   Anion Gap 10 3 - 11 mEq/L   EGFR 72 (L) >90 ml/min/1.73 m2     Studies/Results:  No results found.  Medications: I have reviewed the patient's current medications. Gabapentin at hs may help her sleep as well as helping symptoms from slight peripheral neuropathy in feet. Miralax as above Reviewed Decadron instructions premed for taxol Granix will be given days 7-8-9 to avoid taxol aches and precede nadir cycle 1.   DISCUSSION Counts and chemistries today good for cycle 2 chem on 10-5, which she prefers to keep at q 3 week dosing given how well she has felt for at least last 10 days. Reviewed interventions for taxol aches, plans for preventing  constipation and timing of premedication decadron  Use of granix and scheduling discussed.  They have a cruise planned Jan 28 x 7 days, timing not clear if she does have vaginal brachytherapy. They would like to have consultation with Dr Sondra Come particularly as they are concerned about how this might work with their trip.    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 Adjuvant carboplatin taxol begun 06-11-16, neutropenic day 9. Consider vaginal brachytherapy after chemotherapy. She will have cycle 2 on 07-03-16 using labs from today, granix on 10-11,12,13. I will see her on 10-12  She will keep appointment with Dr Alycia Rossetti on 07-02-16 2.neuropathic pain left inguinal area postoperatively, no clear etiology on repeat CT AP then. Improved 3.Indeterminate right  renal lesion on CT 4.hypothyroid on replacement by PCP 5.post cholecystectomy and remote BTL 6.past GERD 7.Up to date mammograms and colonoscopy 8.Atherosclerotic thoracic and abdominal aorta by CT 9.Degenerative arthritis spine 10.intentional weight loss in past year, ~ 40 lbs 11.difficult IV access: may need central line. She does not want this as yet. 12.flu vaccine 06-23-16 13.minimal chemo peripheral neuropathy feet: try gabapentin at hs, lots of movement and massage, follow  All questions answered and patient/ husband are in agreement with plans as above, know to call if needed prior to scheduled appointments. Chemo and granix orders confirmed. Time spent 25 min including >50% counseling and coordination of care. Route PCP. In [facility] for upcoming visits Drs Alycia Rossetti and Delena Serve, MD   07/02/2016, 12:58 PM

## 2016-07-02 ENCOUNTER — Encounter: Payer: Self-pay | Admitting: Oncology

## 2016-07-02 ENCOUNTER — Ambulatory Visit: Payer: Medicare Other

## 2016-07-02 ENCOUNTER — Ambulatory Visit: Payer: Medicare Other | Attending: Gynecologic Oncology | Admitting: Gynecologic Oncology

## 2016-07-02 ENCOUNTER — Encounter: Payer: Self-pay | Admitting: Gynecologic Oncology

## 2016-07-02 DIAGNOSIS — K449 Diaphragmatic hernia without obstruction or gangrene: Secondary | ICD-10-CM | POA: Diagnosis not present

## 2016-07-02 DIAGNOSIS — M8580 Other specified disorders of bone density and structure, unspecified site: Secondary | ICD-10-CM | POA: Insufficient documentation

## 2016-07-02 DIAGNOSIS — M1711 Unilateral primary osteoarthritis, right knee: Secondary | ICD-10-CM | POA: Insufficient documentation

## 2016-07-02 DIAGNOSIS — Z86010 Personal history of colonic polyps: Secondary | ICD-10-CM | POA: Diagnosis not present

## 2016-07-02 DIAGNOSIS — Z9079 Acquired absence of other genital organ(s): Secondary | ICD-10-CM | POA: Insufficient documentation

## 2016-07-02 DIAGNOSIS — Z87891 Personal history of nicotine dependence: Secondary | ICD-10-CM | POA: Diagnosis not present

## 2016-07-02 DIAGNOSIS — T451X5A Adverse effect of antineoplastic and immunosuppressive drugs, initial encounter: Secondary | ICD-10-CM

## 2016-07-02 DIAGNOSIS — C541 Malignant neoplasm of endometrium: Secondary | ICD-10-CM | POA: Insufficient documentation

## 2016-07-02 DIAGNOSIS — Z90722 Acquired absence of ovaries, bilateral: Secondary | ICD-10-CM | POA: Diagnosis not present

## 2016-07-02 DIAGNOSIS — G620 Drug-induced polyneuropathy: Secondary | ICD-10-CM | POA: Insufficient documentation

## 2016-07-02 DIAGNOSIS — Z90710 Acquired absence of both cervix and uterus: Secondary | ICD-10-CM | POA: Diagnosis not present

## 2016-07-02 DIAGNOSIS — E669 Obesity, unspecified: Secondary | ICD-10-CM | POA: Insufficient documentation

## 2016-07-02 DIAGNOSIS — K219 Gastro-esophageal reflux disease without esophagitis: Secondary | ICD-10-CM | POA: Diagnosis not present

## 2016-07-02 MED ORDER — DEXAMETHASONE 4 MG PO TABS: ORAL_TABLET | ORAL | 1 refills | Status: DC

## 2016-07-02 NOTE — Progress Notes (Signed)
Consult Note: Gyn-Onc  Consult was requested by Dr. Nori Riis for the evaluation of Kayla Guerrero 81 y.o. female  CC:  Chief Complaint  Patient presents with  . endometrial cancer    Follow up    Assessment/Plan:  Kayla Guerrero  is a 81 y.o.  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 Her postoperative case was complicated by neuropathic pain most likely from a lymph node dissection but that is resolved now. She is currently undergoing adjuvant therapy with chemotherapy. She will require vaginal cuff brachii therapy. She does have a trip coming up January 27. She would like to fluid be completed all her therapy by then. She will be interested in concomitant vaginal brachii therapy during her chemotherapy in an effort to shorten her total treatment time. I discussed with her that this needs to be mention to her radiation oncologist and she sees him for consultation. I will be seeing her after she completes therapy right any time that is necessary.  She come return to her more usual activities including her water exercises and intercourse.  HPI: Kayla Guerrero is an extremely pleasant 81 year old G2P2 who was seen in consultation at the request of Dr Nori Riis for high grade (serous) endometrial cancer.   The patient reports postmenopausal spotting since July, 2017. She was seen by Dr Nori Riis for this who performed a TVUS on 04/08/16 which revealed a 7.3x4x5.2cm uterus with a 3.3cm irregular mass within the endometrium. The ovaries were normal.   A hysteroscopy and D&C was performed on 04/09/16 with several polypoid structures seen and removed. 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   05/06/16: Surgery: Total robotic hysterectomy, bilateral salpingo-oophorectomy, bilateral pelvic and para-aortic lymph node dissection. Failed SLN mapping.  Complications: None    Pathology: Uterus, cervix, bilateral tubes and ovaries, bilateral pelvic and para-aortic lymph nodes to pathology.   Operative findings: Normal cervix, normal appearing uterus and adnexa. Adhesions of the colon to the left pelvic sidewall. Omentum adherent to LUQ. No SLN mapping. No enlarged lymph nodes  Diagnosis 1. Lymph node, biopsy, right peri-aortic - ONE OF ONE LYMPH NODES NEGATIVE FOR MALIGNANCY (0/1). 2. Lymph node, biopsy, left peri-aortic - ONE OF ONE LYMPH NODES NEGATIVE FOR MALIGNANCY (0/1). 3. Lymph node, biopsy, right pelvic - FOUR OF FOUR LYMPH NODES NEGATIVE FOR MALIGNANCY (0/4). 4. Lymph node, biopsy, left pelvic - FIVE OF FIVE LYMPH NODES NEGATIVE FOR MALIGNANCY (0/5). 5. Uterus +/- tubes/ovaries, neoplastic - UTERUS: -ENDO/MYOMETRIUM: INVASIVE MIXED ENDOMETRIOID AND SEROUS CARCINOMA, SPANNING 4 CM. TUMOR INVADES OUTER HALF OF MYOMETRIUM. LYMPHOVASCULAR INVASION PRESENT. SEE ONCOLOGY TABLE. LEIOMYOMA. -SEROSA: UNINVOLVED. NO MALIGNANCY. - CERVIX: BENIGN SQUAMOUS AND ENDOCERVICAL MUCOSA. NO DYSPLASIA OR MALIGNANCY. - BILATERAL OVARIES: INCLUSION CYSTS. NO MALIGNANCY. - BILATERAL FALLOPIAN TUBES: UNREMARKABLE. NO MALIGNANCY.  She did very well postoperatively until August 12 when she went out to lunch and since that time noticed some which she describes excruciating pain. She was seen in our clinic and secondary concerns were possible lymphocyst or seroma causing pressure on the femoral nerves and radiculopathy she had a CT scan was ordered which revealed:  CT 05/16/16: IMPRESSION: 1. Extensive subcutaneous emphysema about the abdomen and pelvis, likely postoperative. Probable abdominal pelvic wall small volume hematomas, as above. 2. Right pelvic sidewall fluid collection is likely a seroma or lymphangioma. No explanation for left lower extremity pain. 3. Minimal ill-defined fluid in the presacral  space and left adnexa. 4. Small hiatal hernia. 5. **An  incidental finding of potential clinical significance has been found. Indeterminate right renal lesion is similar to on the recent exam. Consider further evaluation with dedicated outpatient pre and post contrast abdominal MRI.**  I last saw her on August 30. At that time she was feeling somewhat better. She was last seen by Dr. Marko Plume on October 2. She had received her first cycle of Taxol and carboplatin on 06/11/2016. She became neutropenic with an ANC of 0.9 on day #9 of cycle #1. She did complain of some pain in her joints. The nerve pain that she complained of after surgery is improved and she's no longer taking gabapentin. She has some has some issues with constipation. She does have some neuropathy in her toes. She states that she had some component of this pre-chemotherapy and is slightly worse limited to her feet she has not in her fingers.  Current Meds:  Outpatient Encounter Prescriptions as of 07/02/2016  Medication Sig  . calcium carbonate (OS-CAL) 600 MG TABS Take 600 mg by mouth daily.    . Cholecalciferol (VITAMIN D) 2000 UNITS tablet Take 2,000 Units by mouth daily.  [name] dexamethasone (DECADRON) 4 MG tablet Take 5 tablets with food 12 hrs and 6 hrs prior to Taxol Chemotherapy.  . fish oil-omega-3 fatty acids 1000 MG capsule Take 1 g by mouth daily.   [name] gabapentin (NEURONTIN) 100 MG capsule Take 1 capsule (100 mg total) by mouth 3 (three) times daily.  [name] glucosamine-chondroitin 500-400 MG tablet Take 1 tablet by mouth 2 (two) times daily.   [name] levothyroxine (SYNTHROID, LEVOTHROID) 88 MCG tablet TAKE ONE TABLET BY MOUTH ONCE DAILY BEFORE BREAKFAST  . LORazepam (ATIVAN) 0.5 MG tablet Place 1 tablet under the tongue or swallow every 6 hrs as needed for nausea. Will make drowsy.  . Magnesium 400 MG CAPS Take 1 capsule by mouth daily.    . Multiple Vitamin (MULTI-VITAMIN) tablet Take 1 tablet by mouth daily.    . naproxen sodium (ANAPROX) 220 MG tablet Take 220 mg by mouth 2 (two) times  daily with a meal.  . polyethylene glycol (MIRALAX / GLYCOLAX) packet Take 17 g by mouth 2 (two) times daily as needed.  . Red Yeast Rice Extract (RED YEAST RICE PO) Take 1 tablet by mouth 3 (three) times daily.   [name] terbinafine (LAMISIL) 250 MG tablet Take 1 tablet (250 mg total) by mouth daily.  . Wheat Dextrin (BENEFIBER DRINK MIX PO) Take by mouth 2 (two) times daily.  . ondansetron (ZOFRAN) 8 MG tablet Take 1 tablet (8 mg total) by mouth every 8 (eight) hours as needed for nausea (Will not make drowsy.). (Patient not taking: Reported on 07/02/2016)  . oxyCODONE-acetaminophen (PERCOCET/ROXICET) 5-325 MG tablet Take 1-2 tablets by mouth every 4 (four) hours as needed (moderate to severe pain). (Patient not taking: Reported on 07/02/2016)  . senna-docusate (SENOKOT S) 8.6-50 MG tablet Take 1 tablet by mouth 2 (two) times daily as needed for mild constipation.   Facility-Administered Encounter Medications as of 07/02/2016  Medication  . influenza  inactive virus vaccine (FLUZONE/FLUARIX) injection 0.5 mL    Allergy:  Allergies  Allergen Reactions  . Levaquin [Levofloxacin Hemihydrate] Other (See Comments)  . Promethazine Hcl Other (See Comments)    fainting  . Sulfa Drugs Cross Reactors Swelling    Social Hx:   Social History   Social History  . Marital status: Married    Spouse name: N/A  .  Number of children: 1  . Years of education: N/A   Occupational History  . retired Librarian, academic); Artist    Social History Main Topics  . Smoking status: Former Smoker    Types: Cigarettes    Quit date: 09/29/1980  . Smokeless tobacco: Never Used  . Alcohol use 0.0 oz/week     Comment: 1 glass of wine 3-4 times/week; hard liquor (highball) or a beer once a week  . Drug use: No  . Sexual activity: Not on file   Other Topics Concern  . Not on file   Social History Narrative   Lives with husband.  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 Son lives in [place]; granddaughter lives in [place] (with her mother--son is divorced)    Past Surgical Hx:  Past Surgical History:  Procedure Laterality Date  . CATARACT EXTRACTION, BILATERAL    . CHOLECYSTECTOMY    . COLONOSCOPY  2010, 04/2013   Buccini  . PTERYGIUM EXCISION    . ROBOTIC ASSISTED TOTAL HYSTERECTOMY WITH BILATERAL SALPINGO OOPHERECTOMY N/A 05/06/2016   Procedure: XI ROBOTIC ASSISTED  LAPAROSCOPIC TOTAL HYSTERECTOMY WITH BILATERAL SALPINGO OOPHORECTOMY;  Surgeon: Nancy Marus, MD;  Location: [facility];  Service: Gynecology;  Laterality: N/A;  . SENTINEL NODE BIOPSY N/A 05/06/2016   Procedure: SENTINEL NODE BIOPSY;  Surgeon: Nancy Marus, MD;  Location: [facility];  Service: Gynecology;  Laterality: N/A;  . TUBAL LIGATION      Past Medical Hx:  Past Medical History:  Diagnosis Date  . Arthritis   . Arthritis of knee, left   . Borderline osteopenia DEXA 2006 and 2012  . Cancer [facility])    endometrial  . Cataract   . Cholesterol serum elevated   . Colon polyps   . FHx: colon cancer   . GERD (gastroesophageal reflux disease)   . Hypothyroidism   . Obesity     Past Gynecological History:  Tubal ligation, SVD x 2. No LMP recorded. Patient is postmenopausal.  Family Hx:  Family History  Problem Relation Age of Onset  . Cancer Mother 65    COLON   . Heart disease Mother   . Cancer Maternal Aunt   . Breast cancer Maternal Aunt   . Mental illness Maternal Uncle   . Cancer Maternal Grandmother     stomach  . Macular degeneration Maternal Grandmother   . Diabetes Neg Hx     Vitals:  There were no vitals taken for this visit.  Physical Exam:  Well-nourished well-developed female in no acute distress.  Abdomen: Well-healed surgical incisions. Abdomen is soft, nontender, nondistended. There is no appreciable incisional hernias.  Extremities: There is no edema.  Pelvic: External genitalia within normal limits  though atrophic. The vagina is markedly atrophic. The vaginal cuff is intact. There is no visible lesions. Bimanual examination reveals no thickening and nodularity or masses. There is no tenderness or fluctuance.   Shawnese Magner A., MD  07/02/2016, 2:05 PM

## 2016-07-02 NOTE — Patient Instructions (Signed)
It is safe to resume intercourse and to attend water aerobics if your counts are normal.  We will see you after completion of your chemotherapy and radiation.  Please call our office for any questions or concerns.

## 2016-07-03 ENCOUNTER — Ambulatory Visit (HOSPITAL_BASED_OUTPATIENT_CLINIC_OR_DEPARTMENT_OTHER): Payer: Medicare Other

## 2016-07-03 ENCOUNTER — Other Ambulatory Visit: Payer: Self-pay | Admitting: Family Medicine

## 2016-07-03 VITALS — BP 146/82 | HR 83 | Temp 98.5°F | Resp 18

## 2016-07-03 DIAGNOSIS — C541 Malignant neoplasm of endometrium: Secondary | ICD-10-CM | POA: Diagnosis not present

## 2016-07-03 DIAGNOSIS — Z5111 Encounter for antineoplastic chemotherapy: Secondary | ICD-10-CM

## 2016-07-03 DIAGNOSIS — Z1231 Encounter for screening mammogram for malignant neoplasm of breast: Secondary | ICD-10-CM

## 2016-07-03 MED ORDER — SODIUM CHLORIDE 0.9 % IV SOLN
Freq: Once | INTRAVENOUS | Status: AC
  Administered 2016-07-03: 14:00:00 via INTRAVENOUS
  Filled 2016-07-03: qty 5

## 2016-07-03 MED ORDER — DIPHENHYDRAMINE HCL 50 MG/ML IJ SOLN
25.0000 mg | Freq: Once | INTRAMUSCULAR | Status: AC
  Administered 2016-07-03: 25 mg via INTRAVENOUS

## 2016-07-03 MED ORDER — SODIUM CHLORIDE 0.9 % IV SOLN
417.0000 mg | Freq: Once | INTRAVENOUS | Status: AC
  Administered 2016-07-03: 420 mg via INTRAVENOUS
  Filled 2016-07-03: qty 42

## 2016-07-03 MED ORDER — FAMOTIDINE IN NACL 20-0.9 MG/50ML-% IV SOLN
INTRAVENOUS | Status: AC
  Filled 2016-07-03: qty 50

## 2016-07-03 MED ORDER — SODIUM CHLORIDE 0.9 % IV SOLN
Freq: Once | INTRAVENOUS | Status: AC
  Administered 2016-07-03: 14:00:00 via INTRAVENOUS

## 2016-07-03 MED ORDER — SODIUM CHLORIDE 0.9 % IV SOLN
175.0000 mg/m2 | Freq: Once | INTRAVENOUS | Status: AC
  Administered 2016-07-03: 342 mg via INTRAVENOUS
  Filled 2016-07-03: qty 57

## 2016-07-03 MED ORDER — FAMOTIDINE IN NACL 20-0.9 MG/50ML-% IV SOLN
20.0000 mg | Freq: Once | INTRAVENOUS | Status: AC
  Administered 2016-07-03: 20 mg via INTRAVENOUS

## 2016-07-03 MED ORDER — DIPHENHYDRAMINE HCL 50 MG/ML IJ SOLN
INTRAMUSCULAR | Status: AC
  Filled 2016-07-03: qty 1

## 2016-07-03 MED ORDER — SODIUM CHLORIDE 0.9 % IV SOLN
Freq: Once | INTRAVENOUS | Status: AC
  Administered 2016-07-03: 14:00:00 via INTRAVENOUS
  Filled 2016-07-03: qty 4

## 2016-07-03 NOTE — Patient Instructions (Signed)
[facility] Discharge Instructions for Patients Receiving Chemotherapy  Today you received the following chemotherapy agents Taxol/Carboplatin  To help prevent nausea and vomiting after your treatment, we encourage you to take your nausea medication    If you develop nausea and vomiting that is not controlled by your nausea medication, call the clinic.   BELOW ARE SYMPTOMS THAT SHOULD BE REPORTED IMMEDIATELY:  *FEVER GREATER THAN 100.5 F  *CHILLS WITH OR WITHOUT FEVER  NAUSEA AND VOMITING THAT IS NOT CONTROLLED WITH YOUR NAUSEA MEDICATION  *UNUSUAL SHORTNESS OF BREATH  *UNUSUAL BRUISING OR BLEEDING  TENDERNESS IN MOUTH AND THROAT WITH OR WITHOUT PRESENCE OF ULCERS  *URINARY PROBLEMS  *BOWEL PROBLEMS  UNUSUAL RASH Items with * indicate a potential emergency and should be followed up as soon as possible.  Feel free to call the clinic you have any questions or concerns. The clinic phone number is [phone].  Please show the CHEMO ALERT CARD at check-in to the Emergency Department and triage nurse.   

## 2016-07-09 ENCOUNTER — Other Ambulatory Visit: Payer: Self-pay | Admitting: Oncology

## 2016-07-09 ENCOUNTER — Ambulatory Visit (HOSPITAL_BASED_OUTPATIENT_CLINIC_OR_DEPARTMENT_OTHER): Payer: Medicare Other

## 2016-07-09 VITALS — BP 121/61 | HR 93 | Temp 98.4°F | Resp 20

## 2016-07-09 DIAGNOSIS — D701 Agranulocytosis secondary to cancer chemotherapy: Secondary | ICD-10-CM | POA: Diagnosis not present

## 2016-07-09 DIAGNOSIS — C541 Malignant neoplasm of endometrium: Secondary | ICD-10-CM

## 2016-07-09 MED ORDER — TBO-FILGRASTIM 300 MCG/0.5ML ~~LOC~~ SOSY
300.0000 ug | PREFILLED_SYRINGE | Freq: Once | SUBCUTANEOUS | Status: AC
  Administered 2016-07-09: 300 ug via SUBCUTANEOUS
  Filled 2016-07-09: qty 0.5

## 2016-07-09 NOTE — Patient Instructions (Signed)

## 2016-07-10 ENCOUNTER — Other Ambulatory Visit (HOSPITAL_BASED_OUTPATIENT_CLINIC_OR_DEPARTMENT_OTHER): Payer: Medicare Other

## 2016-07-10 ENCOUNTER — Ambulatory Visit (HOSPITAL_BASED_OUTPATIENT_CLINIC_OR_DEPARTMENT_OTHER): Payer: Medicare Other | Admitting: Oncology

## 2016-07-10 ENCOUNTER — Ambulatory Visit (HOSPITAL_BASED_OUTPATIENT_CLINIC_OR_DEPARTMENT_OTHER): Payer: Medicare Other

## 2016-07-10 ENCOUNTER — Other Ambulatory Visit: Payer: Self-pay | Admitting: Family Medicine

## 2016-07-10 VITALS — BP 111/62 | HR 88 | Temp 97.7°F | Resp 18 | Ht 66.0 in | Wt 171.7 lb

## 2016-07-10 DIAGNOSIS — N289 Disorder of kidney and ureter, unspecified: Secondary | ICD-10-CM | POA: Diagnosis not present

## 2016-07-10 DIAGNOSIS — C541 Malignant neoplasm of endometrium: Secondary | ICD-10-CM

## 2016-07-10 DIAGNOSIS — G620 Drug-induced polyneuropathy: Secondary | ICD-10-CM | POA: Diagnosis not present

## 2016-07-10 DIAGNOSIS — T451X5A Adverse effect of antineoplastic and immunosuppressive drugs, initial encounter: Secondary | ICD-10-CM

## 2016-07-10 DIAGNOSIS — D701 Agranulocytosis secondary to cancer chemotherapy: Secondary | ICD-10-CM

## 2016-07-10 DIAGNOSIS — R112 Nausea with vomiting, unspecified: Secondary | ICD-10-CM

## 2016-07-10 DIAGNOSIS — G629 Polyneuropathy, unspecified: Secondary | ICD-10-CM

## 2016-07-10 LAB — CBC WITH DIFFERENTIAL/PLATELET
BASO%: 0.6 % (ref 0.0–2.0)
BASOS ABS: 0 10*3/uL (ref 0.0–0.1)
EOS ABS: 0.1 10*3/uL (ref 0.0–0.5)
EOS%: 2.8 % (ref 0.0–7.0)
HCT: 33.1 % — ABNORMAL LOW (ref 34.8–46.6)
HGB: 11.1 g/dL — ABNORMAL LOW (ref 11.6–15.9)
LYMPH%: 33.6 % (ref 14.0–49.7)
MCH: 29.1 pg (ref 25.1–34.0)
MCHC: 33.5 g/dL (ref 31.5–36.0)
MCV: 86.6 fL (ref 79.5–101.0)
MONO#: 0.1 10*3/uL (ref 0.1–0.9)
MONO%: 2.4 % (ref 0.0–14.0)
NEUT#: 2 10*3/uL (ref 1.5–6.5)
NEUT%: 60.6 % (ref 38.4–76.8)
PLATELETS: 179 10*3/uL (ref 145–400)
RBC: 3.82 10*6/uL (ref 3.70–5.45)
RDW: 14.7 % — ABNORMAL HIGH (ref 11.2–14.5)
WBC: 3.3 10*3/uL — ABNORMAL LOW (ref 3.9–10.3)
lymph#: 1.1 10*3/uL (ref 0.9–3.3)

## 2016-07-10 LAB — BASIC METABOLIC PANEL
Anion Gap: 9 mEq/L (ref 3–11)
BUN: 17.5 mg/dL (ref 7.0–26.0)
CALCIUM: 8.9 mg/dL (ref 8.4–10.4)
CHLORIDE: 104 meq/L (ref 98–109)
CO2: 25 mEq/L (ref 22–29)
CREATININE: 0.8 mg/dL (ref 0.6–1.1)
EGFR: 68 mL/min/{1.73_m2} — ABNORMAL LOW (ref >90)
Glucose: 95 mg/dl (ref 70–140)
Potassium: 3.9 mEq/L (ref 3.5–5.1)
SODIUM: 138 meq/L (ref 136–145)

## 2016-07-10 MED ORDER — TBO-FILGRASTIM 300 MCG/0.5ML ~~LOC~~ SOSY
300.0000 ug | PREFILLED_SYRINGE | Freq: Once | SUBCUTANEOUS | Status: AC
  Administered 2016-07-10: 300 ug via SUBCUTANEOUS
  Filled 2016-07-10: qty 0.5

## 2016-07-10 NOTE — Patient Instructions (Signed)

## 2016-07-10 NOTE — Progress Notes (Signed)
OFFICE PROGRESS NOTE   July 10, 2016   Physicians: Nancy Marus Everitt Amber, Tomi Bamberger, Eve (PCP),Ron Hilaria Ota [place], [place].Alucio, Monna Fam (ophth), Szott (dentist), Hiatt (podiatry)  INTERVAL HISTORY:   Patient is seen, together with husband, in continuing attention to adjuvant chemotherapy for IB serous carcinoma of endometrium, cycle 2 carbo taxol given 07-03-16. Due to neutropenia cycle 1, she is to have granix days 7-8-9, timing due to taxol aches.  She declined change in regimen to dose dense weekly taxol after cycle 1. She saw Dr Alycia Rossetti 07-02-16 and will see her again after adjuvant treatment completes. Dr Alycia Rossetti mentioned possible concomitant HDR with chemo. She is to see Dr Sondra Come in consultation on 07-21-16  Patient has tolerated cycle 2 just a bit better than cycle 1 thus far. She had aches especially in legs x 3 days, was taking claritin; those aches have improved. She vomited after eating pizza on day 6, nausea meds at least somewhat helpful. Bowels have moved adequately. Appetite is poor, ate only soup and drank water yesterday, but likes milkshakes and ate cereal this AM. PO fluids likely not adequate. She has been mostly on couch during day. She was weak this AM to point that husband did not think she would be able to dress herself, but felt better with antiemetic/ breakfast etc. She denies abdominal or pelvic pain. She denies increased SOB. Peripheral IV access ok for cycle 2. Minimal peripheral neuropathy tips of fingers and a little moreso in feet, not interfering with activity. No fever or symptoms of infection Remainder of 10 point Review of Systems negative.   No central catheter Flu vaccine 06-23-16 No genetics testing  Husband very supportive but tearful as he describes her symptoms after this chemo.  ONCOLOGIC HISTORY Patient presented to PCP with new vaginal discharge and some vaginal spotting, referred to Dr Dory Horn, whom she had known previously. D&C on  04-09-16 had high grade carcinoma favoring serous histology [id number]). She was referred to gyn oncology, saw Dr Denman George on 04-23-16. CT CAP 04-30-16 no evidence of metastatic disease, no pelvic adenopathy, no ascites, thickened endometrial canal. On 05-06-16 Dr Alycia Rossetti performed robotic hysterectomy BSO bilateral pelvic and paraaortic node evaluation (failed sentinel LN mapping); patient was DC home on 05-07-16. Pathology [phone]) had mixed endometrioid (80%) and serous (20%) carcinoma 4 cm size, invading 2.0 cm into myometrium 2.1 cm thick, + LVSI, Total 11 bilateral pelvic and paraaortic nodes negative, no washings. Post operative course was remarkable for acute onset left groin and thigh pain ~ 6 days post op, with repeat CT 05-16-16 without findings specific for those complaints; she was treated with neurontin and percocet, tapering now as symptoms have progressively improved. First carboplatin taxol given 06-11-16, neutropenic with ANC 0.9 on day 9 cycle 1.     Objective:  Vital signs in last 24 hours:  BP 111/62 (BP Location: Right Arm, Patient Position: Sitting)   Pulse 88   Temp 97.7 F (36.5 C) (Oral)   Resp 18   Ht '5\' 6"'$  (1.676 m)   Wt 171 lb 11.2 oz (77.9 kg)   SpO2 98%   BMI 27.71 kg/m  BP noted, baseline usually closer to 992 systolic, not on BP meds Alert, oriented and appropriate. Does not appear in any acute distress. Respirations not labored. Ambulatory without assistance.    HEENT:PERRL, sclerae not icteric. Oral mucosa moist,  without lesions, posterior pharynx clear.  Neck supple. No JVD.  Lymphatics:no cervical,supraclavicular adenopathy Resp: clear to auscultation bilaterally  Cardio: regular rate and rhythm. No gallop. GI: soft, nontender, not distended, no mass or organomegaly. Normally active bowel sounds. Surgical incisions not remarkable. Musculoskeletal/ Extremities: without pitting edema, cords, tenderness Neuro: minimal peripheral neuropathy fingertips and  soles of feet. Otherwise nonfocal. PSYCH not as tearful today Skin without rash, petechiae. Minimal resolving ecchymosis at site of IV access, no cord or erythema   Lab Results:  Results for orders placed or performed in visit on 07/10/16  CBC with Differential  Result Value Ref Range   WBC 3.3 (L) 3.9 - 10.3 10e3/uL   NEUT# 2.0 1.5 - 6.5 10e3/uL   HGB 11.1 (L) 11.6 - 15.9 g/dL   HCT 18.0 (L) 30.3 - 56.3 %   Platelets 179 145 - 400 10e3/uL   MCV 86.6 79.5 - 101.0 fL   MCH 29.1 25.1 - 34.0 pg   MCHC 33.5 31.5 - 36.0 g/dL   RBC 4.24 4.48 - 8.06 10e6/uL   RDW 14.7 (H) 11.2 - 14.5 %   lymph# 1.1 0.9 - 3.3 10e3/uL   MONO# 0.1 0.1 - 0.9 10e3/uL   Eosinophils Absolute 0.1 0.0 - 0.5 10e3/uL   Basophils Absolute 0.0 0.0 - 0.1 10e3/uL   NEUT% 60.6 38.4 - 76.8 %   LYMPH% 33.6 14.0 - 49.7 %   MONO% 2.4 0.0 - 14.0 %   EOS% 2.8 0.0 - 7.0 %   BASO% 0.6 0.0 - 2.0 %  Basic metabolic panel  Result Value Ref Range   Sodium 138 136 - 145 mEq/L   Potassium 3.9 3.5 - 5.1 mEq/L   Chloride 104 98 - 109 mEq/L   CO2 25 22 - 29 mEq/L   Glucose 95 70 - 140 mg/dl   BUN 90.7 7.0 - 31.7 mg/dL   Creatinine 0.8 0.6 - 1.1 mg/dL   Calcium 8.9 8.4 - 51.2 mg/dL   Anion Gap 9 3 - 11 mEq/L   EGFR 68 (L) >90 ml/min/1.73 m2     Studies/Results:  No results found.  Medications: I have reviewed the patient's current medications. Discussed use of prn antiemetics including SL ativan if vomiting. She will continue claritin next few days due to granix She will have granix today and 10-13. She has gabapentin available, previously used for post op inguinal neuropathy, could try gabapentin for chemo peripheral neuropathy if she would like.  DISCUSSION  Interval history reviewed. Meds as above.  Patient and husband do remember that she really felt quite well after first week or so with cycle 1, so hopefully just given a few more days she will again feel better. She needs to increase oral hydration, encouraged  noncaffienated fluids and encouraged the milkshakes. She will not try pizza again. I will ask [facility] support staff to contact patient and husband also.  If concomitant HDR, probably will need timing of this to avoid week after each chemo. They have a cruise planned ~ late Jan.    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 Adjuvant carboplatin taxol begun 06-11-16, neutropenic day 9. Consider vaginal brachytherapy during or after chemotherapy per Dr Duard Brady, consultation with Dr Roselind Messier upcoming. I will see her with lab 10-23, cycle 3 due 10-26 if counts ok and otherwise stable 2.neuropathic pain left inguinal area postoperatively, no clear etiology on repeat CT AP then. Improved tho still slight discomfort intermittently. 3.Indeterminate right renal lesion on CT 4.hypothyroid on replacement by PCP 5.post cholecystectomy and remote BTL 6.past  GERD 7.Up to date mammograms and colonoscopy 8.Atherosclerotic thoracic and abdominal aorta by CT 9.Degenerative arthritis spine 10.intentional weight loss in past year, ~ 40 lbs 11.difficult IV access: may need central line. She does not want this as yet. 12.flu vaccine 06-23-16 13.minimal chemo peripheral neuropathy feet: try gabapentin at hs, lots of movement and massage, follow   All questions answered. Granix orders confirmed. They know to call if needed prior to next scheduled visit. Time spent 25 min including >50% counseling and coordination of care. Route PCP, cc Dr Nori Riis, Dr Sondra Come   Evlyn Clines, MD   07/10/2016, 6:32 PM

## 2016-07-10 NOTE — Telephone Encounter (Signed)
She will need to have at least a med check (due for wellness visit) in January.  She is busy with cancer treatments right now, but probably a good idea to get her on the schedule (and cancellation list for wellness visit).

## 2016-07-10 NOTE — Telephone Encounter (Signed)
Does she need to have TSH done? Or is she yearly?

## 2016-07-11 ENCOUNTER — Ambulatory Visit (HOSPITAL_BASED_OUTPATIENT_CLINIC_OR_DEPARTMENT_OTHER): Payer: Medicare Other

## 2016-07-11 VITALS — BP 137/61 | HR 88 | Temp 97.9°F | Resp 18

## 2016-07-11 DIAGNOSIS — D701 Agranulocytosis secondary to cancer chemotherapy: Secondary | ICD-10-CM | POA: Diagnosis not present

## 2016-07-11 DIAGNOSIS — C541 Malignant neoplasm of endometrium: Secondary | ICD-10-CM | POA: Diagnosis not present

## 2016-07-11 MED ORDER — TBO-FILGRASTIM 300 MCG/0.5ML ~~LOC~~ SOSY
300.0000 ug | PREFILLED_SYRINGE | Freq: Once | SUBCUTANEOUS | Status: AC
  Administered 2016-07-11: 300 ug via SUBCUTANEOUS
  Filled 2016-07-11: qty 0.5

## 2016-07-11 NOTE — Patient Instructions (Signed)

## 2016-07-12 ENCOUNTER — Ambulatory Visit: Payer: Medicare Other

## 2016-07-12 ENCOUNTER — Encounter: Payer: Self-pay | Admitting: Oncology

## 2016-07-12 DIAGNOSIS — G620 Drug-induced polyneuropathy: Secondary | ICD-10-CM | POA: Insufficient documentation

## 2016-07-12 DIAGNOSIS — T451X5A Adverse effect of antineoplastic and immunosuppressive drugs, initial encounter: Secondary | ICD-10-CM

## 2016-07-12 DIAGNOSIS — R112 Nausea with vomiting, unspecified: Secondary | ICD-10-CM | POA: Insufficient documentation

## 2016-07-16 ENCOUNTER — Ambulatory Visit
Admission: RE | Admit: 2016-07-16 | Discharge: 2016-07-16 | Disposition: A | Discharge status: Home / Self Care | Payer: Medicare Other | Source: Ambulatory Visit | Attending: Family Medicine | Admitting: Family Medicine

## 2016-07-16 DIAGNOSIS — Z1231 Encounter for screening mammogram for malignant neoplasm of breast: Secondary | ICD-10-CM

## 2016-07-20 ENCOUNTER — Other Ambulatory Visit: Payer: Self-pay | Admitting: Oncology

## 2016-07-21 ENCOUNTER — Encounter: Payer: Self-pay | Admitting: Radiation Oncology

## 2016-07-21 ENCOUNTER — Encounter: Payer: Self-pay | Admitting: Oncology

## 2016-07-21 ENCOUNTER — Ambulatory Visit
Admission: RE | Admit: 2016-07-21 | Discharge: 2016-07-21 | Disposition: A | Discharge status: Home / Self Care | Payer: Medicare Other | Source: Ambulatory Visit | Attending: Radiation Oncology | Admitting: Radiation Oncology

## 2016-07-21 ENCOUNTER — Other Ambulatory Visit (HOSPITAL_BASED_OUTPATIENT_CLINIC_OR_DEPARTMENT_OTHER): Payer: Medicare Other

## 2016-07-21 ENCOUNTER — Ambulatory Visit (HOSPITAL_BASED_OUTPATIENT_CLINIC_OR_DEPARTMENT_OTHER): Payer: Medicare Other | Admitting: Oncology

## 2016-07-21 VITALS — BP 114/62 | HR 85 | Temp 98.4°F | Resp 18 | Ht 66.0 in | Wt 176.5 lb

## 2016-07-21 VITALS — BP 134/83 | HR 80 | Temp 98.2°F | Resp 18 | Ht 66.0 in | Wt 176.0 lb

## 2016-07-21 DIAGNOSIS — Z881 Allergy status to other antibiotic agents status: Secondary | ICD-10-CM | POA: Diagnosis not present

## 2016-07-21 DIAGNOSIS — C541 Malignant neoplasm of endometrium: Secondary | ICD-10-CM

## 2016-07-21 DIAGNOSIS — Z882 Allergy status to sulfonamides status: Secondary | ICD-10-CM | POA: Diagnosis not present

## 2016-07-21 DIAGNOSIS — D701 Agranulocytosis secondary to cancer chemotherapy: Secondary | ICD-10-CM | POA: Diagnosis not present

## 2016-07-21 DIAGNOSIS — Z888 Allergy status to other drugs, medicaments and biological substances status: Secondary | ICD-10-CM | POA: Diagnosis not present

## 2016-07-21 DIAGNOSIS — Z803 Family history of malignant neoplasm of breast: Secondary | ICD-10-CM | POA: Insufficient documentation

## 2016-07-21 DIAGNOSIS — Z90722 Acquired absence of ovaries, bilateral: Secondary | ICD-10-CM | POA: Diagnosis not present

## 2016-07-21 DIAGNOSIS — Z79899 Other long term (current) drug therapy: Secondary | ICD-10-CM | POA: Insufficient documentation

## 2016-07-21 DIAGNOSIS — N289 Disorder of kidney and ureter, unspecified: Secondary | ICD-10-CM | POA: Diagnosis not present

## 2016-07-21 DIAGNOSIS — M1712 Unilateral primary osteoarthritis, left knee: Secondary | ICD-10-CM | POA: Insufficient documentation

## 2016-07-21 DIAGNOSIS — G620 Drug-induced polyneuropathy: Secondary | ICD-10-CM | POA: Diagnosis not present

## 2016-07-21 DIAGNOSIS — M8580 Other specified disorders of bone density and structure, unspecified site: Secondary | ICD-10-CM | POA: Diagnosis not present

## 2016-07-21 DIAGNOSIS — Z8249 Family history of ischemic heart disease and other diseases of the circulatory system: Secondary | ICD-10-CM | POA: Diagnosis not present

## 2016-07-21 DIAGNOSIS — T451X5A Adverse effect of antineoplastic and immunosuppressive drugs, initial encounter: Secondary | ICD-10-CM

## 2016-07-21 DIAGNOSIS — Z8349 Family history of other endocrine, nutritional and metabolic diseases: Secondary | ICD-10-CM | POA: Insufficient documentation

## 2016-07-21 DIAGNOSIS — Z90710 Acquired absence of both cervix and uterus: Secondary | ICD-10-CM | POA: Insufficient documentation

## 2016-07-21 DIAGNOSIS — Z800 Family history of malignant neoplasm of digestive organs: Secondary | ICD-10-CM | POA: Diagnosis not present

## 2016-07-21 DIAGNOSIS — Z87891 Personal history of nicotine dependence: Secondary | ICD-10-CM | POA: Insufficient documentation

## 2016-07-21 DIAGNOSIS — Z8379 Family history of other diseases of the digestive system: Secondary | ICD-10-CM | POA: Diagnosis not present

## 2016-07-21 LAB — CBC WITH DIFFERENTIAL/PLATELET
BASO%: 1 % (ref 0.0–2.0)
Basophils Absolute: 0 10*3/uL (ref 0.0–0.1)
EOS%: 1.1 % (ref 0.0–7.0)
Eosinophils Absolute: 0 10*3/uL (ref 0.0–0.5)
HEMATOCRIT: 33.1 % — AB (ref 34.8–46.6)
HGB: 11 g/dL — ABNORMAL LOW (ref 11.6–15.9)
LYMPH#: 1.7 10*3/uL (ref 0.9–3.3)
LYMPH%: 43 % (ref 14.0–49.7)
MCH: 29.2 pg (ref 25.1–34.0)
MCHC: 33.1 g/dL (ref 31.5–36.0)
MCV: 88.4 fL (ref 79.5–101.0)
MONO#: 0.2 10*3/uL (ref 0.1–0.9)
MONO%: 5.1 % (ref 0.0–14.0)
NEUT%: 49.8 % (ref 38.4–76.8)
NEUTROS ABS: 1.9 10*3/uL (ref 1.5–6.5)
PLATELETS: 272 10*3/uL (ref 145–400)
RBC: 3.75 10*6/uL (ref 3.70–5.45)
RDW: 16.2 % — AB (ref 11.2–14.5)
WBC: 3.9 10*3/uL (ref 3.9–10.3)

## 2016-07-21 LAB — COMPREHENSIVE METABOLIC PANEL
ALT: 7 U/L (ref 0–55)
ANION GAP: 8 meq/L (ref 3–11)
AST: 15 U/L (ref 5–34)
Albumin: 3 g/dL — ABNORMAL LOW (ref 3.5–5.0)
Alkaline Phosphatase: 63 U/L (ref 40–150)
BUN: 9.9 mg/dL (ref 7.0–26.0)
CALCIUM: 8.9 mg/dL (ref 8.4–10.4)
CO2: 27 meq/L (ref 22–29)
CREATININE: 0.9 mg/dL (ref 0.6–1.1)
Chloride: 106 mEq/L (ref 98–109)
EGFR: 65 mL/min/{1.73_m2} — ABNORMAL LOW (ref >90)
Glucose: 98 mg/dl (ref 70–140)
Potassium: 4.7 mEq/L (ref 3.5–5.1)
Sodium: 141 mEq/L (ref 136–145)
TOTAL PROTEIN: 6.5 g/dL (ref 6.4–8.3)

## 2016-07-21 NOTE — Progress Notes (Signed)
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 She will have cycle 3 on 07-24-16, with granix support. She is to see Dr Sondra Come in consultation also today, for possible HDR concomitant with chemo.  Patient has felt much better again for past week, back doing water aerobics (which [facility]) and all regular activities. Appetite is good, po fluid intake good, bowels moving well, no fever or symptoms of infection, no N/V, no fever or symptoms of infection, minimal peripheral neuropathy fingertips and toes, no neuropathy now in inguinal areas. She denies SOB, pain, LE swelling, any bleeding.  Remainder of 10 point Review of Systems negative.    No central catheter Flu vaccine 06-23-16 No genetics testing   ONCOLOGIC HISTORY Patient presented to PCP with new vaginal discharge and some vaginal spotting, referred to Dr Dory Horn, whom she had known previously. D&C on 04-09-16 had high grade carcinoma favoring serous histology [phone]). She was referred to gyn oncology, saw Dr Denman George on 04-23-16. CT CAP 04-30-16 no evidence of metastatic disease, no pelvic adenopathy, no ascites, thickened endometrial canal. On 05-06-16 Dr Alycia Rossetti performed robotic hysterectomy BSO bilateral pelvic and paraaortic node evaluation (failed sentinel LN mapping); patient was DC home on 05-07-16. Pathology [phone]) had mixed endometrioid (80%) and serous (20%) carcinoma 4 cm size, invading 2.0 cm into myometrium 2.1 cm thick, + LVSI, Total 11 bilateral pelvic and paraaortic nodes negative, no washings. Post operative course was remarkable for acute onset left groin and thigh pain ~ 6 days post op, with repeat  CT 05-16-16 without findings specific for those complaints; she was treated with neurontin and percocet, tapering now as symptoms have progressively improved. First carboplatin taxol given 06-11-16, neutropenic with ANC 0.9 on day 9 cycle 1.   Objective:  Vital signs in last 24 hours:  BP 114/62 (BP Location: Right Arm, Patient Position: Sitting)   Pulse 85   Temp 98.4 F (36.9 C) (Oral)   Resp 18   Ht _0  (1.676 m)   Wt 176 lb 8 oz (80.1 kg)   SpO2 99%   BMI 28.49 kg/m  Weight up 5 lbs Alert, oriented and appropriate. Ambulatory without difficulty. Looks energetic and comfortable, in good spirits Complete alopecia  HEENT:PERRL, sclerae not icteric. Oral mucosa moist without lesions, posterior pharynx clear.  Neck supple. No JVD.  Lymphatics:no cervical,supraclavicular or inguinal adenopathy Resp: clear to auscultation bilaterally and normal percussion bilaterally Cardio: regular rate and rhythm. No gallop. GI: soft, nontender, not distended, no mass or organomegaly. Normally active bowel sounds. Surgical incisions not remarkable. Musculoskeletal/ Extremities: LE without pitting edema, cords, tenderness Neuro: no significant peripheral neuropathy. Otherwise nonfocal Skin without rash, ecchymosis, petechiae   Lab Results:  Results for orders placed or performed in visit on 07/21/16  CBC with Differential  Result Value Ref Range   WBC 3.9 3.9 - 10.3 10e3/uL   NEUT# 1.9 1.5 - 6.5 10e3/uL   HGB 11.0 (L) 11.6 - 15.9 g/dL   HCT 33.1 (L) 34.8 - 46.6 %   Platelets 272 145 - 400 10e3/uL   MCV 88.4 79.5 - 101.0 fL   MCH 29.2 25.1 - 34.0 pg   MCHC 33.1 31.5 - 36.0 g/dL   RBC  3.75 3.70 - 5.45 10e6/uL   RDW 16.2 (H) 11.2 - 14.5 %   lymph# 1.7 0.9 - 3.3 10e3/uL   MONO# 0.2 0.1 - 0.9 10e3/uL   Eosinophils Absolute 0.0 0.0 - 0.5 10e3/uL   Basophils Absolute 0.0 0.0 - 0.1 10e3/uL   NEUT% 49.8 38.4 - 76.8 %   LYMPH% 43.0 14.0 - 49.7 %   MONO% 5.1 0.0 - 14.0 %   EOS% 1.1 0.0 - 7.0  %   BASO% 1.0 0.0 - 2.0 %  Comprehensive metabolic panel  Result Value Ref Range   Sodium 141 136 - 145 mEq/L   Potassium 4.7 3.5 - 5.1 mEq/L   Chloride 106 98 - 109 mEq/L   CO2 27 22 - 29 mEq/L   Glucose 98 70 - 140 mg/dl   BUN 9.9 7.0 - 26.0 mg/dL   Creatinine 0.9 0.6 - 1.1 mg/dL   Total Bilirubin <0.22 0.20 - 1.20 mg/dL   Alkaline Phosphatase 63 40 - 150 U/L   AST 15 5 - 34 U/L   ALT 7 0 - 55 U/L   Total Protein 6.5 6.4 - 8.3 g/dL   Albumin 3.0 (L) 3.5 - 5.0 g/dL   Calcium 8.9 8.4 - 10.4 mg/dL   Anion Gap 8 3 - 11 mEq/L   EGFR 65 (L) >90 ml/min/1.73 m2     Studies/Results:  No results found.  Medications: I have reviewed the patient's current medications. She will take premedication decadron 20 mg 12 hrs and 6 hrs prior to taxol.  She will continue claritin.  Granix timing discussed OK to use gabapentin 1=2x daily for taxol peripheral neuropathy if needed. She saw a program re possible addiction to gabapentin.   DISCUSSION Patient and husband are pleased that she has had such a good week, and understand that pattern of symptoms after chemo and then improvement is expected after subsequent cycles. Encouraged them to concentrate on milkshakes and other po's that have been most tolerable for her just after chemo.   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 Adjuvant carboplatin taxol begun 06-11-16, neutropenic day 9. Consider vaginal brachytherapy comcomitant with chemotherapy, timing out from each chemo treatment likely to be best tolerated. Cycle 3 on 10-26 if ANC >=1.5 and plt >=100k, with granix 10-31, 11-1 and with my visit 11-2. 2.neuropathic pain left inguinal area postoperatively, no clear etiology on repeat CT AP then. Improved. Slight taxol peripheral neuropathy,  follow. 3.Indeterminate right renal lesion on CT 4.hypothyroid on replacement by PCP 5.post cholecystectomy and remote BTL 6.past GERD 7.Up to date mammograms and colonoscopy 8.Atherosclerotic thoracic and abdominal aorta by CT 9.Degenerative arthritis spine 10.intentional weight loss in past year, ~ 40 lbs 11.difficult IV access: may need central line. She does not want this as yet. 12.flu vaccine 06-23-16 13.minimal chemo peripheral neuropathy feet: try gabapentin at hs, lots of movement and massage, continue water exercise as able, follow  All questions answered and they know to call if needed prior to next scheduled appointments. Chemo and granix orders confirmed. Route PCP, time spent 25 min including >50% counseling and coordination of care.    Evlyn Clines, MD   07/21/2016, 2:37 PM

## 2016-07-21 NOTE — Progress Notes (Signed)
GYN Location of Tumor / Histology: IB serous carcinoma of endometrium  Kayla Guerrero presented with symptoms of: new vaginal discharge and some vaginal spotting   Biopsies revealed:   05/06/16 Diagnosis 1. Lymph node, biopsy, right peri-aortic - ONE OF ONE LYMPH NODES NEGATIVE FOR MALIGNANCY (0/1). 2. Lymph node, biopsy, left peri-aortic - ONE OF ONE LYMPH NODES NEGATIVE FOR MALIGNANCY (0/1). 3. Lymph node, biopsy, right pelvic - FOUR OF FOUR LYMPH NODES NEGATIVE FOR MALIGNANCY (0/4). 4. Lymph node, biopsy, left pelvic - FIVE OF FIVE LYMPH NODES NEGATIVE FOR MALIGNANCY (0/5). 5. Uterus +/- tubes/ovaries, neoplastic - UTERUS: -ENDO/MYOMETRIUM: INVASIVE MIXED ENDOMETRIOID AND SEROUS CARCINOMA, SPANNING 4 CM. TUMOR INVADES OUTER HALF OF MYOMETRIUM. LYMPHOVASCULAR INVASION PRESENT. SEE ONCOLOGY TABLE. LEIOMYOMA. -SEROSA: UNINVOLVED. NO MALIGNANCY. - CERVIX: BENIGN SQUAMOUS AND ENDOCERVICAL MUCOSA. NO DYSPLASIA OR MALIGNANCY. - BILATERAL OVARIES: INCLUSION CYSTS. NO MALIGNANCY. - BILATERAL FALLOPIAN TUBES: UNREMARKABLE. NO MALIGNANCY  04/09/16 Diagnosis Endometrium, curettage - HIGH GRADE ENDOMETRIAL CARCINOMA, SEE COMMENT.  Past/Anticipated interventions by Gyn/Onc surgery, if any: 05/06/16 - Procedure: XI ROBOTIC ASSISTED  LAPAROSCOPIC TOTAL HYSTERECTOMY WITH BILATERAL SALPINGO OOPHORECTOMY;  Surgeon: Nancy Marus, MD  Past/Anticipated interventions by medical oncology, if any: Adjuvant carboplatin taxol begun 06-11-16  Weight changes, if any:  Wt Readings from Last 3 Encounters:  07/21/16 176 lb (79.8 kg)  07/21/16 176 lb 8 oz (80.1 kg)  07/10/16 171 lb 11.2 oz (77.9 kg)    Bowel/Bladder complaints, if any: Urinary frequency and constipation  Nausea/Vomiting, if any: Nausea after chemotherapy   Adjuvant carboplatin taxol begun 06-11-16 taking Zofran    every 3 weeks  Pain issues, if any: None  SAFETY ISSUES:  Prior radiation? No  Pacemaker/ICD? No  Possible  current pregnancy? No  Is the patient on methotrexate? No  Current Complaints / other details:  Dr. Alycia Rossetti is recommending vaginal brachii therapy during her chemotherapy. BP 134/83 (BP Location: Left Arm, Patient Position: Sitting, Cuff Size: Normal)   Pulse 80   Temp 98.2 F (36.8 C) (Oral)   Resp 18   Ht 5\' 6"  (1.676 m)   Wt 176 lb (79.8 kg)   SpO2 98%   BMI 28.41 kg/m

## 2016-07-21 NOTE — Progress Notes (Signed)
Radiation Oncology         [phone] ________________________________  Initial outpatient Consultation  Name: Kayla Guerrero MRN: [id number]  Date: 07/21/2016  DOB: May 09, 1935  QN:[id number] A, MD  Rita Ohara, MD   REFERRING PHYSICIAN: Rita Ohara, MD  DIAGNOSIS: Stage IB endometrioid and serous carcinoma of endometrium  HISTORY OF PRESENT ILLNESS::Kayla Guerrero is a 81 y.o. female who is seen out courtesy of Dr Alycia Rossetti and Marko Plume for consideration for adjuvant treatment as part of management of patient's recently diagnosed endometrial carcinoma. The patient presented with new onset vaginal discharge and some spotting earlier this year. She was seen by Dr. Dory Horn and a D&C on July 12 revealed high-grade carcinoma favoring serous histology. Patient was referred to gynecologic oncology was seen by Dr. Denman George. A CT scan of abdomen and pelvis showed thickening within the endometrial canal but no evidence of metastatic disease. On August 8 patient proceeded to undergo a robotic-assisted hysterectomy, BSO, bilateral pelvic and periaortic or treatment node evaluation ( failed sentinel lobe lymph node mapping). Pathology from the patient's surgery revealed mixed endometrioid and serous carcinoma measuring 4 cm in size. There was deep myometrial invasion and lymphovascular space invasion. A total of 11 bilateral pelvic and periaortic lymph nodes showed no evidence of metastasis. Patient had some postoperative neuropathic pain has been helped with Neurontin. Patient received her first cycle of adjuvant chemotherapy with carboplatinum and Taxol September 13.[name]  PREVIOUS RADIATION THERAPY: No  PAST MEDICAL HISTORY:  has a past medical history of Arthritis; Arthritis of knee, left; Borderline osteopenia (DEXA 2006 and 2012); Cancer ([place]); Cataract; Cholesterol serum elevated; Colon polyps; FHx: colon cancer; GERD (gastroesophageal reflux disease); Hypothyroidism; and Obesity.    PAST SURGICAL  HISTORY: Past Surgical History:  Procedure Laterality Date  . CATARACT EXTRACTION, BILATERAL    . CHOLECYSTECTOMY    . COLONOSCOPY  2010, 04/2013   Buccini  . PTERYGIUM EXCISION    . ROBOTIC ASSISTED TOTAL HYSTERECTOMY WITH BILATERAL SALPINGO OOPHERECTOMY N/A 05/06/2016   Procedure: XI ROBOTIC ASSISTED  LAPAROSCOPIC TOTAL HYSTERECTOMY WITH BILATERAL SALPINGO OOPHORECTOMY;  Surgeon: Nancy Marus, MD;  Location: [facility];  Service: Gynecology;  Laterality: N/A;  . SENTINEL NODE BIOPSY N/A 05/06/2016   Procedure: SENTINEL NODE BIOPSY;  Surgeon: Nancy Marus, MD;  Location: [facility];  Service: Gynecology;  Laterality: N/A;  . TUBAL LIGATION      FAMILY HISTORY: family history includes Breast cancer in her maternal aunt; Cancer in her maternal aunt and maternal grandmother; Cancer (age of onset: 49) in her mother; Heart disease in her mother; Macular degeneration in her maternal grandmother; Mental illness in her maternal uncle.  SOCIAL HISTORY:  reports that she quit smoking about 35 years ago. Her smoking use included Cigarettes. She has never used smokeless tobacco. She reports that she drinks alcohol. She reports that she does not use drugs.  ALLERGIES: Levaquin [levofloxacin hemihydrate]; Promethazine hcl; and Sulfa drugs cross reactors  MEDICATIONS:  Current Outpatient Prescriptions  Medication Sig Dispense Refill  . calcium carbonate (OS-CAL) 600 MG TABS Take 600 mg by mouth daily.      . Cholecalciferol (VITAMIN D) 2000 UNITS tablet Take 2,000 Units by mouth daily.    [name] dexamethasone (DECADRON) 4 MG tablet Take 5 tablets with food 12 hrs and 6 hrs prior to Taxol Chemotherapy. 10 tablet 1  . fish oil-omega-3 fatty acids 1000 MG capsule Take 1 g by mouth daily.     [name] gabapentin (NEURONTIN) 100 MG capsule Take 1  capsule (100 mg total) by mouth 3 (three) times daily. 60 capsule 0  . glucosamine-chondroitin 500-400 MG tablet Take 1 tablet by mouth 2 (two) times daily.     [name] levothyroxine  (SYNTHROID, LEVOTHROID) 88 MCG tablet TAKE ONE TABLET BY MOUTH ONCE DAILY BEFORE  BREAKFAST 90 tablet 0  . loratadine (CLARITIN) 10 MG tablet Take 10 mg by mouth daily as needed for allergies.    [name] LORazepam (ATIVAN) 0.5 MG tablet Place 1 tablet under the tongue or swallow every 6 hrs as needed for nausea. Will make drowsy. 20 tablet 0  . Magnesium 400 MG CAPS Take 1 capsule by mouth daily.      . Multiple Vitamin (MULTI-VITAMIN) tablet Take 1 tablet by mouth daily.      . naproxen sodium (ANAPROX) 220 MG tablet Take 220 mg by mouth 2 (two) times daily with a meal.    . ondansetron (ZOFRAN) 8 MG tablet Take 1 tablet (8 mg total) by mouth every 8 (eight) hours as needed for nausea (Will not make drowsy.). 30 tablet 0  . oxyCODONE-acetaminophen (PERCOCET/ROXICET) 5-325 MG tablet Take 1-2 tablets by mouth every 4 (four) hours as needed (moderate to severe pain). 45 tablet 0  . polyethylene glycol (MIRALAX / GLYCOLAX) packet Take 17 g by mouth 2 (two) times daily as needed.    . Red Yeast Rice Extract (RED YEAST RICE PO) Take 1 tablet by mouth 3 (three) times daily.     [name] senna-docusate (SENOKOT S) 8.6-50 MG tablet Take 1 tablet by mouth 2 (two) times daily as needed for mild constipation.    . terbinafine (LAMISIL) 250 MG tablet Take 1 tablet (250 mg total) by mouth daily. 30 tablet 0  . Wheat Dextrin (BENEFIBER DRINK MIX PO) Take by mouth 2 (two) times daily.     No current facility-administered medications for this encounter.    Facility-Administered Medications Ordered in Other Encounters  Medication Dose Route Frequency Provider Last Rate Last Dose  . influenza  inactive virus vaccine (FLUZONE/FLUARIX) injection 0.5 mL  0.5 mL Intramuscular Once Rita Ohara, MD        REVIEW OF SYSTEMS:  A 15 point review of systems is documented in the electronic medical record. This was obtained by the nursing staff. However, I reviewed this with the patient to discuss relevant findings and make appropriate  changes.  she did experience some neuropathic type pain after her surgery which is improved with Neurontin. No vaginal bleeding since surgery   PHYSICAL EXAM:  height is 5\' 6"  (1.676 m) and weight is 176 lb (79.8 kg). Her oral temperature is 98.2 F (36.8 C). Her blood pressure is 134/83 and her pulse is 80. Her respiration is 18 and oxygen saturation is 98%.   General: Alert and oriented, in no acute distress HEENT: Head is normocephalic. Extraocular movements are intact. Oropharynx is clear. Neck: Neck is supple, no palpable cervical or supraclavicular lymphadenopathy. Heart: Regular in rate and rhythm with no murmurs, rubs, or gallops. Chest: Clear to auscultation bilaterally, with no rhonchi, wheezes, or rales. Abdomen: Soft, nontender, nondistended, with no rigidity or guarding. Small scars present from laparoscopic procedure. No signs of recurrence along the scars. No inguinal adenopathy Extremities: No cyanosis or edema. Lymphatics: see Neck Exam Skin: No concerning lesions. Musculoskeletal: symmetric strength and muscle tone throughout. Neurologic: Cranial nerves II through XII are grossly intact. No obvious focalities. Speech is fluent. Coordination is intact. Psychiatric: Judgment and insight are intact. Affect is appropriate. Pelvic exam  deferred until simulation and planning day     ECOG = 1  LABORATORY DATA:  Lab Results  Component Value Date   WBC 3.9 07/21/2016   HGB 11.0 (L) 07/21/2016   HCT 33.1 (L) 07/21/2016   MCV 88.4 07/21/2016   PLT 272 07/21/2016   NEUTROABS 1.9 07/21/2016   Lab Results  Component Value Date   NA 141 07/21/2016   K 4.7 07/21/2016   CL 107 05/07/2016   CO2 27 07/21/2016   GLUCOSE 98 07/21/2016   CREATININE 0.9 07/21/2016   CALCIUM 8.9 07/21/2016       RADIOGRAPHY: Mm Screening Breast Tomo Bilateral  Result Date: 07/17/2016 CLINICAL DATA:  Screening. EXAM: 2D DIGITAL SCREENING BILATERAL MAMMOGRAM WITH CAD AND ADJUNCT TOMO  COMPARISON:  Previous exam(s). ACR Breast Density Category b: There are scattered areas of fibroglandular density. FINDINGS: There are no findings suspicious for malignancy. Images were processed with CAD. IMPRESSION: No mammographic evidence of malignancy. A result letter of this screening mammogram will be mailed directly to the patient. RECOMMENDATION: Screening mammogram in one year. (Code:SM-B-01Y) BI-RADS CATEGORY  1: Negative. Electronically Signed   By: Fidela Salisbury M.D.   On: 07/17/2016 16:09   Preop Abdomen and Pelvis CT:  IMPRESSION: 1. Markedly thickened/widened endometrium consistent with known endometrial cancer. No evidence of serosal or extra uterine extension. 2. No findings to suggest metastatic disease involving the chest, abdomen or pelvis. 3. Indeterminant 12.5 mm right renal lesion, small enhancing mass versus hemorrhagic cyst. Attention on future scans is suggested. 4. Atherosclerotic calcifications involving the thoracic and abdominal aorta and branch vessels but no focal aneurysm. 5. Moderate stool throughout the colon and down into the rectum may suggest constipation.      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 The patient was found to have a deeply invasive tumor with lymphovascular space invasion as well as high-grade lesion. She would be at risk for vaginal cuff recurrence and I would agree with Dr. Elenora Gamma recommendation for adjuvant vaginal brachytherapy treatments. I discussed the course of treatment side effects and potential toxicities of radiation therapy in this situation the patient and her husband. She appears to understand and wishes to proceed with planned course of treatment.  PLAN: Patient will be scheduled for simulation planning and her first treatment the week of November 6 which would be in between her cycles of chemotherapy. I anticipate 5 intracavitary brachytherapy treatments using iridium 192 as the  high-dose-rate source.     ------------------------------------------------  Blair Promise, PhD, MD

## 2016-07-21 NOTE — Addendum Note (Signed)
Encounter addended by: Gery Pray, MD on: 07/21/2016  6:07 PM<BR>    Actions taken: Problem List reviewed, Sign clinical note, LOS modified, Follow-up modified

## 2016-07-22 ENCOUNTER — Telehealth: Payer: Self-pay | Admitting: *Deleted

## 2016-07-22 NOTE — Telephone Encounter (Signed)
Called patient to inform of New HDR Vag. Cuff Case, spoke with patient and she is aware of these appts. 

## 2016-07-24 ENCOUNTER — Other Ambulatory Visit (HOSPITAL_BASED_OUTPATIENT_CLINIC_OR_DEPARTMENT_OTHER): Payer: Medicare Other

## 2016-07-24 ENCOUNTER — Ambulatory Visit (HOSPITAL_BASED_OUTPATIENT_CLINIC_OR_DEPARTMENT_OTHER): Payer: Medicare Other

## 2016-07-24 VITALS — BP 159/78 | HR 83 | Temp 97.5°F | Resp 18

## 2016-07-24 DIAGNOSIS — C541 Malignant neoplasm of endometrium: Secondary | ICD-10-CM

## 2016-07-24 DIAGNOSIS — Z5111 Encounter for antineoplastic chemotherapy: Secondary | ICD-10-CM | POA: Diagnosis not present

## 2016-07-24 LAB — CBC WITH DIFFERENTIAL/PLATELET
BASO%: 0 % (ref 0.0–2.0)
Basophils Absolute: 0 10*3/uL (ref 0.0–0.1)
EOS%: 0 % (ref 0.0–7.0)
Eosinophils Absolute: 0 10*3/uL (ref 0.0–0.5)
HCT: 35.3 % (ref 34.8–46.6)
HGB: 12 g/dL (ref 11.6–15.9)
LYMPH%: 17.4 % (ref 14.0–49.7)
MCH: 29.6 pg (ref 25.1–34.0)
MCHC: 34 g/dL (ref 31.5–36.0)
MCV: 87.2 fL (ref 79.5–101.0)
MONO#: 0 10*3/uL — ABNORMAL LOW (ref 0.1–0.9)
MONO%: 0.4 % (ref 0.0–14.0)
NEUT#: 2.3 10*3/uL (ref 1.5–6.5)
NEUT%: 82.2 % — AB (ref 38.4–76.8)
PLATELETS: 291 10*3/uL (ref 145–400)
RBC: 4.05 10*6/uL (ref 3.70–5.45)
RDW: 16.2 % — ABNORMAL HIGH (ref 11.2–14.5)
WBC: 2.8 10*3/uL — ABNORMAL LOW (ref 3.9–10.3)
lymph#: 0.5 10*3/uL — ABNORMAL LOW (ref 0.9–3.3)

## 2016-07-24 LAB — COMPREHENSIVE METABOLIC PANEL
ALT: 7 U/L (ref 0–55)
ANION GAP: 10 meq/L (ref 3–11)
AST: 14 U/L (ref 5–34)
Albumin: 3.2 g/dL — ABNORMAL LOW (ref 3.5–5.0)
Alkaline Phosphatase: 70 U/L (ref 40–150)
BUN: 14.9 mg/dL (ref 7.0–26.0)
CHLORIDE: 104 meq/L (ref 98–109)
CO2: 25 meq/L (ref 22–29)
CREATININE: 0.8 mg/dL (ref 0.6–1.1)
Calcium: 9.6 mg/dL (ref 8.4–10.4)
EGFR: 68 mL/min/{1.73_m2} — ABNORMAL LOW (ref >90)
Glucose: 185 mg/dl — ABNORMAL HIGH (ref 70–140)
Potassium: 5.2 mEq/L — ABNORMAL HIGH (ref 3.5–5.1)
SODIUM: 138 meq/L (ref 136–145)
Total Bilirubin: <0.22 mg/dL (ref 0.20–1.20)
Total Protein: 7 g/dL (ref 6.4–8.3)

## 2016-07-24 MED ORDER — PACLITAXEL CHEMO INJECTION 300 MG/50ML
175.0000 mg/m2 | Freq: Once | INTRAVENOUS | Status: AC
  Administered 2016-07-24: 342 mg via INTRAVENOUS
  Filled 2016-07-24: qty 57

## 2016-07-24 MED ORDER — ONDANSETRON HCL 8 MG PO TABS
8.0000 mg | ORAL_TABLET | Freq: Once | ORAL | Status: AC
  Administered 2016-07-24: 8 mg via ORAL

## 2016-07-24 MED ORDER — SODIUM CHLORIDE 0.9 % IV SOLN
417.0000 mg | Freq: Once | INTRAVENOUS | Status: AC
  Administered 2016-07-24: 420 mg via INTRAVENOUS
  Filled 2016-07-24: qty 42

## 2016-07-24 MED ORDER — FAMOTIDINE IN NACL 20-0.9 MG/50ML-% IV SOLN
20.0000 mg | Freq: Once | INTRAVENOUS | Status: AC
Start: 2016-07-24 — End: 2016-07-24
  Administered 2016-07-24: 20 mg via INTRAVENOUS

## 2016-07-24 MED ORDER — ONDANSETRON HCL 8 MG PO TABS
ORAL_TABLET | ORAL | Status: AC
  Filled 2016-07-24: qty 1

## 2016-07-24 MED ORDER — DIPHENHYDRAMINE HCL 50 MG/ML IJ SOLN
25.0000 mg | Freq: Once | INTRAMUSCULAR | Status: AC
  Administered 2016-07-24: 25 mg via INTRAVENOUS

## 2016-07-24 MED ORDER — SODIUM CHLORIDE 0.9 % IV SOLN
Freq: Once | INTRAVENOUS | Status: AC
  Administered 2016-07-24: 10:00:00 via INTRAVENOUS

## 2016-07-24 MED ORDER — DIPHENHYDRAMINE HCL 50 MG/ML IJ SOLN
INTRAMUSCULAR | Status: AC
  Filled 2016-07-24: qty 1

## 2016-07-24 MED ORDER — FOSAPREPITANT DIMEGLUMINE INJECTION 150 MG
Freq: Once | INTRAVENOUS | Status: AC
  Administered 2016-07-24: 10:00:00 via INTRAVENOUS
  Filled 2016-07-24: qty 5

## 2016-07-24 MED ORDER — FAMOTIDINE IN NACL 20-0.9 MG/50ML-% IV SOLN
INTRAVENOUS | Status: AC
  Filled 2016-07-24: qty 50

## 2016-07-24 NOTE — Patient Instructions (Addendum)
[facility] Discharge Instructions for Patients Receiving Chemotherapy  Today you received the following chemotherapy agents: Carboplatin and Taxol   To help prevent nausea and vomiting after your treatment, we encourage you to take your nausea medication as directed.    If you develop nausea and vomiting that is not controlled by your nausea medication, call the clinic.   BELOW ARE SYMPTOMS THAT SHOULD BE REPORTED IMMEDIATELY:  *FEVER GREATER THAN 100.5 F  *CHILLS WITH OR WITHOUT FEVER  NAUSEA AND VOMITING THAT IS NOT CONTROLLED WITH YOUR NAUSEA MEDICATION  *UNUSUAL SHORTNESS OF BREATH  *UNUSUAL BRUISING OR BLEEDING  TENDERNESS IN MOUTH AND THROAT WITH OR WITHOUT PRESENCE OF ULCERS  *URINARY PROBLEMS  *BOWEL PROBLEMS  UNUSUAL RASH Items with * indicate a potential emergency and should be followed up as soon as possible.  Feel free to call the clinic you have any questions or concerns. The clinic phone number is [phone].  Please show the [facility] at check-in to the Emergency Department and triage nurse.    Hyperkalemia Hyperkalemia is when you have too much potassium in your blood. Potassium is normally removed (excreted) from your body by your kidneys. If there is too much potassium in your blood, it can affect your heart's ability to function.  CAUSES  Hyperkalemia may be caused by:   Taking in too much potassium. You can do this by:  Using salt substitutes. They contain large amounts of potassium.  Taking potassium supplements.  Eating foods high in potassium.  Excreting too little potassium. This can happen if:  Your kidneys are not working properly. Kidney (renal) disease, including short- or long-term renal failure, is a very common cause of hyperkalemia.  You are taking medicines that lower your excretion of potassium.  You have Addison disease.  You have a urinary tract blockage, such as kidney stones.  You are on  treatment to mechanically clean your blood (dialysis) and you skip a treatment.  Releasing a high amount of potassium from your cells into your blood. This can happen with:  Injury to muscles (rhabdomyolysis) or other tissues. Most potassium is stored in your muscles.  Severe burns or infections.  Acidic blood plasma (acidosis). Acidosis can result from many diseases, such as uncontrolled diabetes. RISK FACTORS The most common risk factor of hyperkalemia is kidney disease. Other risk factors of hyperkalemia include:  Addison disease. This is a condition where your glands do not produce enough hormones.  Alcoholism or heavy drug use.   Using certain blood pressure medicines, such as angiotensin-converting enzyme (ACE) inhibitors, angiotensin II receptor blockers (ARBs), or potassium-sparing diuretics such as spironolactone.  Severe injury or burn. SIGNS AND SYMPTOMS  Oftentimes, there are no signs or symptoms of hyperkalemia. However, when your potassium level becomes high enough, you may experience symptoms such as:  Irregular or very slow heartbeat.  Nausea.  Fatigue.  Tingling of the skin or numbness of the hands or feet.  Muscle weakness.  Fatigue.  Not being able to move (paralysis). You may not have any symptoms of hyperkalemia.  DIAGNOSIS  Hyperkalemia may be diagnosed by:  Physical exam.  Blood tests.  ECG (electrocardiogram).  Discussion of prescription and non-prescription drug use. TREATMENT  Treatment for hyperkalemia is often directed at the underlying cause. In some instances, treatment may include:   Insulin.  Glucose (sugar) and water solution given through a vein (intravenous or IV).  Dialysis.  Medicines to remove the potassium from your body.  Medicines to  move calcium from your bloodstream into your tissues. HOME CARE INSTRUCTIONS   Take medicines only as directed by your health care provider.  Do not take any supplements, natural  products, herbs, or vitamins without reviewing them with your health care provider. Certain supplements and natural food products can have high amounts of potassium.  Limit your alcohol intake as directed by your health care provider.  Stop illegal drug use. If you need help quitting, ask your health care provider.  Keep all follow-up visits as directed by your health care provider. This is important.  If you have kidney disease, you may need to follow a low potassium diet. A dietitian can help educate you on low potassium foods. SEEK MEDICAL CARE IF:   You notice an irregular or very slow heartbeat.  You feel light-headed.  You feel weak.  You are nauseous.  You have tingling or numbness in your hands or feet. SEEK IMMEDIATE MEDICAL CARE IF:   You have shortness of breath.  You have chest pain or discomfort.  You pass out.  You have muscle paralysis. MAKE SURE YOU:   Understand these instructions.  Will watch your condition.  Will get help right away if you are not doing well or get worse.   This information is not intended to replace advice given to you by your health care provider. Make sure you discuss any questions you have with your health care provider.   Document Released: 09/05/2002 Document Revised: 10/06/2014 Document Reviewed: 12/21/2013 Elsevier Interactive Patient Education [employer].

## 2016-07-24 NOTE — Progress Notes (Signed)
Potassium 5.2, WBC 2.8, Dr. Marko Plume aware and okay to proceed with treatment. Pt educated to monitor potassium intake in her Diet per Dr. Marko Plume. Pt verbalizes understanding.

## 2016-07-29 ENCOUNTER — Emergency Department (HOSPITAL_COMMUNITY): Payer: Medicare Other

## 2016-07-29 ENCOUNTER — Other Ambulatory Visit: Payer: Self-pay

## 2016-07-29 ENCOUNTER — Encounter (HOSPITAL_COMMUNITY): Payer: Self-pay | Admitting: Emergency Medicine

## 2016-07-29 ENCOUNTER — Ambulatory Visit (HOSPITAL_BASED_OUTPATIENT_CLINIC_OR_DEPARTMENT_OTHER): Payer: Medicare Other

## 2016-07-29 ENCOUNTER — Emergency Department (HOSPITAL_COMMUNITY)
Admission: EM | Admit: 2016-07-29 | Discharge: 2016-07-29 | Disposition: A | Discharge status: Home / Self Care | Payer: Medicare Other | Attending: Emergency Medicine | Admitting: Emergency Medicine

## 2016-07-29 VITALS — BP 80/50 | HR 106 | Temp 97.7°F

## 2016-07-29 DIAGNOSIS — C541 Malignant neoplasm of endometrium: Secondary | ICD-10-CM | POA: Insufficient documentation

## 2016-07-29 DIAGNOSIS — Z87891 Personal history of nicotine dependence: Secondary | ICD-10-CM | POA: Diagnosis not present

## 2016-07-29 DIAGNOSIS — I951 Orthostatic hypotension: Secondary | ICD-10-CM

## 2016-07-29 DIAGNOSIS — Z79899 Other long term (current) drug therapy: Secondary | ICD-10-CM | POA: Insufficient documentation

## 2016-07-29 DIAGNOSIS — Z5189 Encounter for other specified aftercare: Secondary | ICD-10-CM | POA: Diagnosis not present

## 2016-07-29 DIAGNOSIS — E039 Hypothyroidism, unspecified: Secondary | ICD-10-CM | POA: Insufficient documentation

## 2016-07-29 DIAGNOSIS — R55 Syncope and collapse: Secondary | ICD-10-CM | POA: Diagnosis present

## 2016-07-29 LAB — CBC WITH DIFFERENTIAL/PLATELET
BASOS ABS: 0 10*3/uL (ref 0.0–0.1)
BASOS PCT: 0 %
EOS ABS: 0 10*3/uL (ref 0.0–0.7)
EOS PCT: 2 %
HCT: 30.8 % — ABNORMAL LOW (ref 36.0–46.0)
Hemoglobin: 10.4 g/dL — ABNORMAL LOW (ref 12.0–15.0)
Lymphocytes Relative: 49 %
Lymphs Abs: 1.1 10*3/uL (ref 0.7–4.0)
MCH: 29.6 pg (ref 26.0–34.0)
MCHC: 33.8 g/dL (ref 30.0–36.0)
MCV: 87.7 fL (ref 78.0–100.0)
MONO ABS: 0 10*3/uL — AB (ref 0.1–1.0)
MONOS PCT: 0 %
NEUTROS ABS: 1.1 10*3/uL — AB (ref 1.7–7.7)
Neutrophils Relative %: 49 %
PLATELETS: 251 10*3/uL (ref 150–400)
RBC: 3.51 MIL/uL — ABNORMAL LOW (ref 3.87–5.11)
RDW: 16.5 % — AB (ref 11.5–15.5)
WBC: 2.3 10*3/uL — ABNORMAL LOW (ref 4.0–10.5)

## 2016-07-29 LAB — COMPREHENSIVE METABOLIC PANEL
ALBUMIN: 3.3 g/dL — AB (ref 3.5–5.0)
ALK PHOS: 47 U/L (ref 38–126)
ALT: 11 U/L — AB (ref 14–54)
ANION GAP: 5 (ref 5–15)
AST: 20 U/L (ref 15–41)
BILIRUBIN TOTAL: 0.8 mg/dL (ref 0.3–1.2)
BUN: 19 mg/dL (ref 6–20)
CALCIUM: 8.5 mg/dL — AB (ref 8.9–10.3)
CO2: 28 mmol/L (ref 22–32)
CREATININE: 0.79 mg/dL (ref 0.44–1.00)
Chloride: 104 mmol/L (ref 101–111)
GFR calc Af Amer: >60 mL/min (ref >60)
GFR calc non Af Amer: >60 mL/min (ref >60)
GLUCOSE: 104 mg/dL — AB (ref 65–99)
Potassium: 3.9 mmol/L (ref 3.5–5.1)
Sodium: 137 mmol/L (ref 135–145)
TOTAL PROTEIN: 6 g/dL — AB (ref 6.5–8.1)

## 2016-07-29 LAB — URINALYSIS, ROUTINE W REFLEX MICROSCOPIC
BILIRUBIN URINE: NEGATIVE
Glucose, UA: NEGATIVE mg/dL
Hgb urine dipstick: NEGATIVE
KETONES UR: NEGATIVE mg/dL
LEUKOCYTES UA: NEGATIVE
NITRITE: NEGATIVE
PROTEIN: NEGATIVE mg/dL
Specific Gravity, Urine: 1.035 — ABNORMAL HIGH (ref 1.005–1.030)
pH: 6.5 (ref 5.0–8.0)

## 2016-07-29 LAB — D-DIMER, QUANTITATIVE (NOT AT ARMC): D DIMER QUANT: 4.87 ug{FEU}/mL — AB (ref 0.00–0.50)

## 2016-07-29 LAB — CBG MONITORING, ED: Glucose-Capillary: 106 mg/dL — ABNORMAL HIGH (ref 65–99)

## 2016-07-29 LAB — TROPONIN I: Troponin I: <0.03 ng/mL (ref <0.03)

## 2016-07-29 MED ORDER — SODIUM CHLORIDE 0.9 % IV BOLUS (SEPSIS)
1000.0000 mL | Freq: Once | INTRAVENOUS | Status: AC
  Administered 2016-07-29: 1000 mL via INTRAVENOUS

## 2016-07-29 MED ORDER — TBO-FILGRASTIM 300 MCG/0.5ML ~~LOC~~ SOSY
300.0000 ug | PREFILLED_SYRINGE | Freq: Once | SUBCUTANEOUS | Status: AC
  Administered 2016-07-29: 300 ug via SUBCUTANEOUS
  Filled 2016-07-29: qty 0.5

## 2016-07-29 MED ORDER — IOPAMIDOL (ISOVUE-370) INJECTION 76%
100.0000 mL | Freq: Once | INTRAVENOUS | Status: AC | PRN
  Administered 2016-07-29: 100 mL via INTRAVENOUS

## 2016-07-29 NOTE — Patient Instructions (Signed)

## 2016-07-29 NOTE — ED Provider Notes (Signed)
[facility] DEPT Provider Note   CSN: [id number] Arrival date & time: 07/29/16  1121     History   Chief Complaint Chief Complaint  Patient presents with  . Weakness  . Loss of Consciousness    HPI Kayla Guerrero is a 81 y.o. female.  Pt presents to the ED from the cancer center because she passed out today.  The pt has endometrial cancer and received her 3rd/6 cycle of chemo on 10/26.  She was at the cancer center today for a Granix injection when she passed out.  The pt said that she has passed out a few times since her chemo.  She does report some n/v, but not a lot.  She has not had much of an appetite.  The pt said that her bp was low at the cancer center.  Pt said she feels weak, but no new pain.      Past Medical History:  Diagnosis Date  . Arthritis   . Arthritis of knee, left   . Borderline osteopenia DEXA 2006 and 2012  . Cancer [facility])    endometrial  . Cataract   . Cholesterol serum elevated   . Colon polyps   . FHx: colon cancer   . GERD (gastroesophageal reflux disease)   . Hypothyroidism   . Obesity     Patient Active Problem List   Diagnosis Date Noted  . Chemotherapy-induced peripheral neuropathy ([place]) 07/12/2016  . Chemotherapy induced nausea and vomiting 07/12/2016  . Chemotherapy-induced neuropathy ([place]) 07/02/2016  . Chemotherapy induced neutropenia ([place]) 06/20/2016  . Poor venous access 06/20/2016  . Arthritis, senescent 06/06/2016  . Neuropathy ([place]) 06/06/2016  . Acute postoperative pain of left groin 05/16/2016  . Malignant neoplasm of endometrium ([place]) 04/23/2016  . Arthritis of knee 10/06/2013  . Macular degeneration 12/19/2012  . Left knee pain 09/15/2012  . Osteopenia 09/15/2012  . Pure hypercholesterolemia 06/11/2011  . Thyroid activity decreased 06/11/2011    Past Surgical History:  Procedure Laterality Date  . CATARACT EXTRACTION, BILATERAL    . CHOLECYSTECTOMY    . COLONOSCOPY  2010, 04/2013   [facility]ccini  . PTERYGIUM  EXCISION    . ROBOTIC ASSISTED TOTAL HYSTERECTOMY WITH BILATERAL SALPINGO OOPHERECTOMY N/A 05/06/2016   Procedure: XI ROBOTIC ASSISTED  LAPAROSCOPIC TOTAL HYSTERECTOMY WITH BILATERAL SALPINGO OOPHORECTOMY;  Surgeon: Nancy Marus, MD;  Location: [facility] ORS;  Service: Gynecology;  Laterality: N/A;  . SENTINEL NODE BIOPSY N/A 05/06/2016   Procedure: SENTINEL NODE BIOPSY;  Surgeon: Nancy Marus, MD;  Location: [facility] ORS;  Service: Gynecology;  Laterality: N/A;  . TUBAL LIGATION      OB History    Gravida Para Term Preterm AB Living   3 2     1 1    SAB TAB Ectopic Multiple Live Births   1               Home Medications    Prior to Admission medications   Medication Sig Start Date End Date Taking? Authorizing Provider  calcium carbonate (OS-CAL) 600 MG TABS Take 600 mg by mouth daily.     Yes Historical Provider, MD  Cholecalciferol (VITAMIN D) 2000 UNITS tablet Take 2,000 Units by mouth daily.   Yes Historical Provider, MD  dexamethasone (DECADRON) 4 MG tablet Take 5 tablets with food 12 hrs and 6 hrs prior to Taxol Chemotherapy. 07/02/16  Yes Lennis Marion Downer, MD  fish oil-omega-3 fatty acids 1000 MG capsule Take 1 g by mouth daily.    Yes Historical  Provider, MD  gabapentin (NEURONTIN) 100 MG capsule Take 1 capsule (100 mg total) by mouth 3 (three) times daily. 05/28/16  Yes Nancy Marus, MD  glucosamine-chondroitin 500-400 MG tablet Take 1 tablet by mouth 2 (two) times daily.    Yes Historical Provider, MD  levothyroxine (SYNTHROID, LEVOTHROID) 88 MCG tablet TAKE ONE TABLET BY MOUTH ONCE DAILY BEFORE  BREAKFAST 07/10/16  Yes Rita Ohara, MD  loratadine (CLARITIN) 10 MG tablet Take 10 mg by mouth daily as needed for allergies.   Yes Historical Provider, MD  LORazepam (ATIVAN) 0.5 MG tablet Place 1 tablet under the tongue or swallow every 6 hrs as needed for nausea. Will make drowsy. 06/06/16  Yes Lennis Marion Downer, MD  Magnesium 400 MG CAPS Take 1 capsule by mouth daily.     Yes Historical Provider, MD    Multiple Vitamin (MULTI-VITAMIN) tablet Take 1 tablet by mouth daily.     Yes Historical Provider, MD  naproxen sodium (ANAPROX) 220 MG tablet Take 220 mg by mouth 2 (two) times daily with a meal.   Yes Historical Provider, MD  ondansetron (ZOFRAN) 8 MG tablet Take 1 tablet (8 mg total) by mouth every 8 (eight) hours as needed for nausea (Will not make drowsy.). 06/06/16  Yes Lennis Marion Downer, MD  oxyCODONE-acetaminophen (PERCOCET/ROXICET) 5-325 MG tablet Take 1-2 tablets by mouth every 4 (four) hours as needed (moderate to severe pain). 05/28/16  Yes Nancy Marus, MD  polyethylene glycol (MIRALAX / GLYCOLAX) packet Take 17 g by mouth 2 (two) times daily as needed for mild constipation.    Yes Historical Provider, MD  Red Yeast Rice Extract (RED YEAST RICE PO) Take 1 tablet by mouth 3 (three) times daily.    Yes Historical Provider, MD  senna-docusate (SENOKOT S) 8.6-50 MG tablet Take 1 tablet by mouth 2 (two) times daily as needed for mild constipation.   Yes Historical Provider, MD  Wheat Dextrin (BENEFIBER DRINK MIX PO) Take by mouth 2 (two) times daily.   Yes Historical Provider, MD  terbinafine (LAMISIL) 250 MG tablet Take 1 tablet (250 mg total) by mouth daily. Patient not taking: Reported on 07/29/2016 05/15/16   Max Villa Herb, DPM    Family History Family History  Problem Relation Age of Onset  . Cancer Mother 32    COLON   . Heart disease Mother   . Cancer Maternal Aunt   . Breast cancer Maternal Aunt   . Mental illness Maternal Uncle   . Cancer Maternal Grandmother     stomach  . Macular degeneration Maternal Grandmother   . Diabetes Neg Hx     Social History Social History  Substance Use Topics  . Smoking status: Former Smoker    Types: Cigarettes    Quit date: 09/29/1980  . Smokeless tobacco: Never Used  . Alcohol use 0.0 oz/week     Comment: 1 glass of wine 3-4 times/week; hard liquor (highball) or a beer once a week     Allergies   Levaquin [levofloxacin hemihydrate];  Promethazine hcl; and Sulfa drugs cross reactors   Review of Systems Review of Systems  Constitutional: Positive for appetite change and fatigue.  Neurological: Positive for syncope and weakness.  All other systems reviewed and are negative.    Physical Exam Updated Vital Signs BP 153/80 (BP Location: Left Arm)   Pulse 75   Temp 97.7 F (36.5 C) (Oral)   Resp 18   Ht 5\' 6"  (1.676 m)   Wt 170 lb (77.1  kg)   SpO2 100%   BMI 27.44 kg/m   Physical Exam  Constitutional: She is oriented to person, place, and time. She appears well-developed and well-nourished.  HENT:  Head: Normocephalic and atraumatic.  Right Ear: External ear normal.  Left Ear: External ear normal.  Nose: Nose normal.  Mouth/Throat: Oropharynx is clear and moist.  Eyes: Conjunctivae and EOM are normal. Pupils are equal, round, and reactive to light.  Neck: Normal range of motion. Neck supple.  Cardiovascular: Normal rate, regular rhythm, normal heart sounds and intact distal pulses.   Pulmonary/Chest: Effort normal and breath sounds normal.  Abdominal: Soft. Bowel sounds are normal.  Musculoskeletal: Normal range of motion.  Neurological: She is alert and oriented to person, place, and time.  Skin: Skin is warm.  Psychiatric: She has a normal mood and affect. Her behavior is normal. Judgment and thought content normal.  Nursing note and vitals reviewed.    ED Treatments / Results  Labs (all labs ordered are listed, but only abnormal results are displayed) Labs Reviewed  COMPREHENSIVE METABOLIC PANEL - Abnormal; Notable for the following:       Result Value   Glucose, Bld 104 (*)    Calcium 8.5 (*)    Total Protein 6.0 (*)    Albumin 3.3 (*)    ALT 11 (*)    All other components within normal limits  CBC WITH DIFFERENTIAL/PLATELET - Abnormal; Notable for the following:    WBC 2.3 (*)    RBC 3.51 (*)    Hemoglobin 10.4 (*)    HCT 30.8 (*)    RDW 16.5 (*)    Neutro Abs 1.1 (*)    Monocytes  Absolute 0.0 (*)    All other components within normal limits  URINALYSIS, ROUTINE W REFLEX MICROSCOPIC (NOT AT [facility]) - Abnormal; Notable for the following:    Specific Gravity, Urine 1.035 (*)    All other components within normal limits  D-DIMER, QUANTITATIVE (NOT AT [facility]) - Abnormal; Notable for the following:    D-Dimer, Quant 4.87 (*)    All other components within normal limits  CBG MONITORING, ED - Abnormal; Notable for the following:    Glucose-Capillary 106 (*)    All other components within normal limits  TROPONIN I    EKG  EKG Interpretation None       Radiology Ct Angio Chest Pe W And/or Wo Contrast  Result Date: 07/29/2016 CLINICAL DATA:  Shortness of breath.  Endometrial carcinoma EXAM: CT ANGIOGRAPHY CHEST WITH CONTRAST TECHNIQUE: Multidetector CT imaging of the chest was performed using the standard protocol during bolus administration of intravenous contrast. Multiplanar CT image reconstructions and MIPs were obtained to evaluate the vascular anatomy. CONTRAST:  100 mL Isovue 370 nonionic COMPARISON:  Chest CT April 30, 2016 FINDINGS: Cardiovascular: There is no demonstrable pulmonary embolus. There is no thoracic aortic aneurysm or dissection. There is moderate atherosclerotic calcification throughout the aorta. There is calcification at the origins of the left common carotid and left subclavian arteries. Visualized great vessels otherwise appear normal. There are foci of coronary artery calcification evident. The pericardium is not appreciably thickened. Mediastinum/Nodes: There is a nodular opacity in the right lobe of the thyroid measuring 1.2 x 0.8 cm. Thyroid otherwise appears unremarkable. There are scattered subcentimeter mediastinal lymph nodes, but there are no lymph nodes meeting size criteria for pathologic significance. There is a focal hiatal hernia. Lungs/Pleura: There is mild bibasilar atelectatic change. There is no parenchymal lung edema or consolidation.  No mass or nodular opacity is evident. There is no pleural thickening or pleural effusion evident. Upper Abdomen: There is a 1 x 0.7 cm cyst in anterior segment of the right lobe of the liver peripherally. Gallbladder is absent. There is stable mild biliary duct dilatation without mass or calculus evident. There is atherosclerotic calcification in the upper abdominal aorta. Visualized upper abdominal structures otherwise appear unremarkable. Musculoskeletal: There is degenerative change in the lower thoracic and upper lumbar spine regions. No blastic or lytic bone lesions are evident. Review of the MIP images confirms the above findings. IMPRESSION: No demonstrable pulmonary embolus. Multiple foci of atherosclerotic calcification in the aorta as well as foci of coronary artery calcification. No edema or consolidation. No lung mass or nodule lesion. No adenopathy. Gallbladder absent.  Stable mild biliary duct prominence. Stable nodular opacity right lobe of thyroid which does not meet consensus guidelines criteria for further assessment. Electronically Signed   By: Lowella Grip III M.D.   On: 07/29/2016 14:26    Procedures Procedures (including critical care time)  Medications Ordered in ED Medications  sodium chloride 0.9 % bolus 1,000 mL (1,000 mLs Intravenous New Bag/Given 07/29/16 1206)  sodium chloride 0.9 % bolus 1,000 mL (1,000 mLs Intravenous New Bag/Given 07/29/16 1317)  iopamidol (ISOVUE-370) 76 % injection 100 mL (100 mLs Intravenous Contrast Given 07/29/16 1347)  sodium chloride 0.9 % bolus 1,000 mL (1,000 mLs Intravenous New Bag/Given 07/29/16 1521)     Initial Impression / Assessment and Plan / ED Course  I have reviewed the triage vital signs and the nursing notes.  Pertinent labs & imaging results that were available during my care of the patient were reviewed by me and considered in my medical decision making (see chart for details).  Clinical Course    Pt is feeling much  better after IVFS.  She is still orthostatic, but she is feeling much better when she stands up.  Pt wants to go home.  She knows to return if worse.  Final Clinical Impressions(s) / ED Diagnoses   Final diagnoses:  Orthostatic syncope  Endometrial cancer [facility])    New Prescriptions New Prescriptions   No medications on file     Isla Pence, MD 07/29/16 1704

## 2016-07-29 NOTE — ED Notes (Signed)
Bed: RESB Expected date:  Expected time:  Means of arrival:  Comments: Cancer center, LOC

## 2016-07-29 NOTE — Progress Notes (Signed)
Pt became unresponsive in chair in Injection Room. At 1120, respirations present, apical pulse noted, Rapid response called, #22 gauge IV inserted in Lt Wrist area, NS hung wide open. Became responsive, talking to nurse. At 1130, pt went unresponsive again. Respirations noted, apical pulse noted. After  One minute, pt became responsive again, talking with staff. Transferred to gurney via 3 assist, transefered to ED with Rapid Response team

## 2016-07-29 NOTE — ED Triage Notes (Signed)
Pt was having cancer treatment-injection.  Wheeled here on stretcher.  After getting it she started "going in and out of consciousness" but husband reports feeling weak the last few days and hasn't been eating/drinking much.  Pt is pale on arrival.  BP 80/? At cancer center.

## 2016-07-29 NOTE — Progress Notes (Signed)
Rapid Response Event Note  Overview: called to cancer center for syncopal episodes. [place] RN and MD at bedside.       Initial Focused Assessment: Patient lethargic, per [place] RN, SBP 80, 4 L oxygen [place] already applied to patient.    Interventions: Patient transported to [facility] ED  Plan of Care (if not transferred): Transfer to ED     Nelva Bush B

## 2016-07-29 NOTE — ED Notes (Signed)
Bed: WA15 Expected date:  Expected time:  Means of arrival:  Comments: RES B 

## 2016-07-30 ENCOUNTER — Ambulatory Visit: Payer: Medicare Other

## 2016-07-30 ENCOUNTER — Ambulatory Visit (HOSPITAL_BASED_OUTPATIENT_CLINIC_OR_DEPARTMENT_OTHER): Payer: Medicare Other

## 2016-07-30 ENCOUNTER — Other Ambulatory Visit: Payer: Self-pay | Admitting: Oncology

## 2016-07-30 VITALS — BP 152/72 | HR 91 | Temp 98.3°F | Resp 18

## 2016-07-30 DIAGNOSIS — Z5189 Encounter for other specified aftercare: Secondary | ICD-10-CM

## 2016-07-30 DIAGNOSIS — C541 Malignant neoplasm of endometrium: Secondary | ICD-10-CM

## 2016-07-30 MED ORDER — TBO-FILGRASTIM 300 MCG/0.5ML ~~LOC~~ SOSY
300.0000 ug | PREFILLED_SYRINGE | Freq: Once | SUBCUTANEOUS | Status: AC
  Administered 2016-07-30: 300 ug via SUBCUTANEOUS
  Filled 2016-07-30: qty 0.5

## 2016-07-30 NOTE — Progress Notes (Signed)
VSS, denies distress. Pt is alert, pulse even, respirations effortless. Pt and family member stated that she is drinking [employer] drink. Encouraged  To push PO fluids such as juice, water, sports drinks. Encouraged pt to eat small frequent meals and to try nutritional supplements such as Ensure or Boost.  Pt and family member verbalized understanding of instructions.  Pt and family members instructed to come to ER or Urgent care center if feeling faint or  Passes out. Pt also instructed to call office at once is she is unable to eat or drink fluids. Pt and family member verbalized instructions.  Denis need for IV fluids at this time.

## 2016-07-30 NOTE — Patient Instructions (Signed)

## 2016-07-31 ENCOUNTER — Ambulatory Visit (HOSPITAL_BASED_OUTPATIENT_CLINIC_OR_DEPARTMENT_OTHER): Payer: Medicare Other | Admitting: Oncology

## 2016-07-31 ENCOUNTER — Ambulatory Visit: Payer: Medicare Other

## 2016-07-31 ENCOUNTER — Emergency Department (HOSPITAL_COMMUNITY)
Admission: EM | Admit: 2016-07-31 | Discharge: 2016-07-31 | Disposition: A | Discharge status: Home / Self Care | Payer: Medicare Other | Attending: Emergency Medicine | Admitting: Emergency Medicine

## 2016-07-31 ENCOUNTER — Encounter (HOSPITAL_COMMUNITY): Payer: Self-pay | Admitting: Emergency Medicine

## 2016-07-31 ENCOUNTER — Other Ambulatory Visit (HOSPITAL_BASED_OUTPATIENT_CLINIC_OR_DEPARTMENT_OTHER): Payer: Medicare Other

## 2016-07-31 ENCOUNTER — Telehealth: Payer: Self-pay

## 2016-07-31 VITALS — BP 77/44 | HR 63 | Temp 98.4°F | Resp 18 | Ht 66.0 in | Wt 173.4 lb

## 2016-07-31 DIAGNOSIS — E039 Hypothyroidism, unspecified: Secondary | ICD-10-CM | POA: Insufficient documentation

## 2016-07-31 DIAGNOSIS — R55 Syncope and collapse: Secondary | ICD-10-CM

## 2016-07-31 DIAGNOSIS — T451X5A Adverse effect of antineoplastic and immunosuppressive drugs, initial encounter: Secondary | ICD-10-CM

## 2016-07-31 DIAGNOSIS — C241 Malignant neoplasm of ampulla of Vater: Secondary | ICD-10-CM

## 2016-07-31 DIAGNOSIS — Z87891 Personal history of nicotine dependence: Secondary | ICD-10-CM | POA: Diagnosis not present

## 2016-07-31 DIAGNOSIS — C541 Malignant neoplasm of endometrium: Secondary | ICD-10-CM

## 2016-07-31 DIAGNOSIS — I959 Hypotension, unspecified: Secondary | ICD-10-CM

## 2016-07-31 DIAGNOSIS — G620 Drug-induced polyneuropathy: Secondary | ICD-10-CM | POA: Diagnosis not present

## 2016-07-31 DIAGNOSIS — D701 Agranulocytosis secondary to cancer chemotherapy: Secondary | ICD-10-CM

## 2016-07-31 DIAGNOSIS — Z8542 Personal history of malignant neoplasm of other parts of uterus: Secondary | ICD-10-CM | POA: Insufficient documentation

## 2016-07-31 DIAGNOSIS — E860 Dehydration: Secondary | ICD-10-CM

## 2016-07-31 DIAGNOSIS — N289 Disorder of kidney and ureter, unspecified: Secondary | ICD-10-CM | POA: Diagnosis not present

## 2016-07-31 DIAGNOSIS — I951 Orthostatic hypotension: Secondary | ICD-10-CM

## 2016-07-31 LAB — CBC WITH DIFFERENTIAL/PLATELET
BASO%: 0.6 % (ref 0.0–2.0)
BASOS ABS: 0 10*3/uL (ref 0.0–0.1)
EOS ABS: 0 10*3/uL (ref 0.0–0.5)
EOS%: 1.1 % (ref 0.0–7.0)
HEMATOCRIT: 33.4 % — AB (ref 34.8–46.6)
HGB: 11 g/dL — ABNORMAL LOW (ref 11.6–15.9)
LYMPH#: 1 10*3/uL (ref 0.9–3.3)
LYMPH%: 25.1 % (ref 14.0–49.7)
MCH: 29.5 pg (ref 25.1–34.0)
MCHC: 32.9 g/dL (ref 31.5–36.0)
MCV: 89.8 fL (ref 79.5–101.0)
MONO#: 0.1 10*3/uL (ref 0.1–0.9)
MONO%: 2.9 % (ref 0.0–14.0)
NEUT#: 2.7 10*3/uL (ref 1.5–6.5)
NEUT%: 70.3 % (ref 38.4–76.8)
PLATELETS: 221 10*3/uL (ref 145–400)
RBC: 3.72 10*6/uL (ref 3.70–5.45)
RDW: 17.3 % — ABNORMAL HIGH (ref 11.2–14.5)
WBC: 3.9 10*3/uL (ref 3.9–10.3)

## 2016-07-31 LAB — BASIC METABOLIC PANEL
ANION GAP: 6 meq/L (ref 3–11)
BUN: 14.2 mg/dL (ref 7.0–26.0)
CO2: 26 mEq/L (ref 22–29)
CREATININE: 0.7 mg/dL (ref 0.6–1.1)
Calcium: 8.6 mg/dL (ref 8.4–10.4)
Chloride: 106 mEq/L (ref 98–109)
EGFR: 79 mL/min/{1.73_m2} — ABNORMAL LOW (ref >90)
GLUCOSE: 95 mg/dL (ref 70–140)
POTASSIUM: 4.6 meq/L (ref 3.5–5.1)
Sodium: 138 mEq/L (ref 136–145)

## 2016-07-31 LAB — MAGNESIUM: Magnesium: 2 mg/dl (ref 1.5–2.5)

## 2016-07-31 MED ORDER — SODIUM CHLORIDE 0.9 % IV SOLN
Freq: Once | INTRAVENOUS | Status: AC
  Administered 2016-07-31: 15:00:00 via INTRAVENOUS

## 2016-07-31 MED ORDER — LORAZEPAM 0.5 MG PO TABS: ORAL_TABLET | ORAL | 0 refills | Status: DC

## 2016-07-31 MED ORDER — DEXAMETHASONE 4 MG PO TABS: ORAL_TABLET | ORAL | 0 refills | Status: DC

## 2016-07-31 NOTE — Discharge Instructions (Signed)
Push fluids to stay hydrated.  Follow up as per your next appointment with Oncology.

## 2016-07-31 NOTE — ED Notes (Signed)
Patient's IV was running fine.  Patient stated she did not wish to be stuck again, patient requested please use IV that is in place.

## 2016-07-31 NOTE — Progress Notes (Signed)
1325 During IV insertion pt c/o not feeling well. Clarified she feels dizzy and hot. She was mewling and breathing heavy for a few minutes. She became clammy. 1338 BP 64/37  HR 48 clammy but oriented. NS at wide open free flow.  She continued to answer questions. Making faces and groaning at times. Dr Marko Plume called. 1345 Pt sent to ER on stretcher with [employer] LPN in attendance. Dr Marko Plume called report.

## 2016-07-31 NOTE — Progress Notes (Signed)
OFFICE PROGRESS NOTE   August 02, 2016   Physicians: Nancy Marus Everitt Amber, Tomi Bamberger, Eve (PCP),Ron Hilaria Ota [place], [place].Alucio, Monna Fam (ophth), Szott (dentist), Hiatt (podiatry)  EMR including ED evaluation from 07-29-16 reviewed in detail for this visit.  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 She had cycle 3 carbo taxol on 07-25-16, with granix support. She has had several episodes of syncope since 10-29, including again at [facility] today, BP 64/37 and HR 48 as RN starting IV for IVF today. She was evaluated again in ED following this visit.  Patient felt well with chemo 10-27 and did water exercises at [facility] on 10-28. She took ativan ~ 2100 on 10-29 for slight nausea, before bed. Around [place] on 10-29 she got up to BR, felt so weak that she collapsed to floor and was unable to get up, called husband.  No associated chest pain or SOB,  still alert as husband began to assist, then LOC for ~ 15 seconds x 2 subsequently as husband tried to get her up and back to bed. She vomited after husband was assisting. No seizure activity. She did feel that heart was "racing". They do not have BP cuff at home, did not check HR. She felt weak on 10-30, then brief LOC x2 witnessed by RN at [facility] on 10-31 when receiving granix injection. She was transported to ED from [facility], evaluation including EKG, CTA chest, normal troponin, seemed better with IVF and DC home. Today she feels a little weak walking into office, orthostatic vitals in exam room with significant drop in BP and elevation in pulse on standing. Patient feels that she has been drinking fluids much better, initially only water, then 2 bottles of Propel on 07-30-16.  She slept last PM,  had loose BM this morning. She is eating some. She denies other pain, LE swelling, any bleeding, any fever or symptoms of infection. She has slight tingling fingertips  and feet, which is not interfering with activity.     No central catheter Flu vaccine 06-23-16 No genetics testing  ONCOLOGIC HISTORY Patient presented to PCP with new vaginal discharge and some vaginal spotting, referred to Dr Dory Horn, whom she had known previously. D&C on 04-09-16 had high grade carcinoma favoring serous histology [phone]). She was referred to gyn oncology, saw Dr Denman George on 04-23-16. CT CAP 04-30-16 no evidence of metastatic disease, no pelvic adenopathy, no ascites, thickened endometrial canal. On 05-06-16 Dr Alycia Rossetti performed robotic hysterectomy BSO bilateral pelvic and paraaortic node evaluation (failed sentinel LN mapping); patient was DC home on 05-07-16. 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 Post operative course was remarkable for acute onset left groin and thigh pain ~ 6 days post op, with repeat CT 05-16-16 without findings specific for those complaints; she was treated with neurontin and percocet, tapering now as symptoms have progressively improved. First carboplatin taxol given 06-11-16, neutropenic with ANC 0.9 on day 9 cycle 1, granix added.    Objective:  Vital signs in last 24 hours: ORTHOSTATIC VITALS AT TIME OF INITIAL MD EVALUATION:  Lying 120/64 with HR 80, sitting 118/60 with HR 80, standing 64/38 with HR 120  BP (!) 77/44   Pulse 63   Temp 98.4 F (36.9 C) (Oral)  Resp 18   Ht _0  (1.676 m)   Wt 173 lb 6.4 oz (78.7 kg)   SpO2 100%   BMI 27.99 kg/m  Weight down 3 lbs from10-23-17 Alert, oriented and appropriate. Ambulatory into office, transferred to infusion area in [facility], then to ED on stretcher.  Alopecia  HEENT:PERRL, sclerae not icteric. Oral mucosa moist without lesions, posterior pharynx clear.  Neck supple. No JVD.  Lymphatics:no cervical,supaclavicular  adenopathy Resp: clear to auscultation bilaterally and normal percussion bilaterally Cardio: regular rate and rhythm. No gallop. GI: soft, nontender, not distended, no mass or organomegaly. Normally active bowel sounds. Surgical incisions not remarkable. Musculoskeletal/ Extremities: without pitting edema, cords, tenderness Neuro: minimal peripheral neuropathy as described. Speech fluent, CN intact, moves extremities equally. Otherwise nonfocal. PSYCH appropriate mood and affect Skin without rash, ecchymosis, petechiae. Sites of IV access not remarkable   Patient seen again when having symptomatic hypotension in infusion area following this exam. Alert, talking, very pale, respirations not labored, moves all extremities. Note BP 64/37 and HR 48 in recliner at that point.  Lab Results:  Results for orders placed or performed in visit on 07/31/16  CBC with Differential  Result Value Ref Range   WBC 3.9 3.9 - 10.3 10e3/uL   NEUT# 2.7 1.5 - 6.5 10e3/uL   HGB 11.0 (L) 11.6 - 15.9 g/dL   HCT 33.4 (L) 34.8 - 46.6 %   Platelets 221 145 - 400 10e3/uL   MCV 89.8 79.5 - 101.0 fL   MCH 29.5 25.1 - 34.0 pg   MCHC 32.9 31.5 - 36.0 g/dL   RBC 3.72 3.70 - 5.45 10e6/uL   RDW 17.3 (H) 11.2 - 14.5 %   lymph# 1.0 0.9 - 3.3 10e3/uL   MONO# 0.1 0.1 - 0.9 10e3/uL   Eosinophils Absolute 0.0 0.0 - 0.5 10e3/uL   Basophils Absolute 0.0 0.0 - 0.1 10e3/uL   NEUT% 70.3 38.4 - 76.8 %   LYMPH% 25.1 14.0 - 49.7 %   MONO% 2.9 0.0 - 14.0 %   EOS% 1.1 0.0 - 7.0 %   BASO% 0.6 0.0 - 2.0 %  Basic metabolic panel  Result Value Ref Range   Sodium 138 136 - 145 mEq/L   Potassium 4.6 3.5 - 5.1 mEq/L   Chloride 106 98 - 109 mEq/L   CO2 26 22 - 29 mEq/L   Glucose 95 70 - 140 mg/dl   BUN 14.2 7.0 - 26.0 mg/dL   Creatinine 0.7 0.6 - 1.1 mg/dL   Calcium 8.6 8.4 - 10.4 mg/dL   Anion Gap 6 3 - 11 mEq/L   EGFR 79 (L) >90 ml/min/1.73 m2  Magnesium  Result Value Ref Range   Magnesium 2.0 1.5 - 2.5 mg/dl      Studies/Results:  EKG 07-29-16 in ED: NSR at 74, borderline intraventricular conduction delay, minimal ST elevation anteroir leads D dimer 07-29-16   4.87, troponin I < 0.03, urine specific gravity 1.035, Na WNL  CT ANGIOGRAPHY CHEST WITH CONTRAST  07-29-16  COMPARISON:  Chest CT April 30, 2016  FINDINGS: Cardiovascular: There is no demonstrable pulmonary embolus. There is no thoracic aortic aneurysm or dissection. There is moderate atherosclerotic calcification throughout the aorta. There is calcification at the origins of the left common carotid and left subclavian arteries. Visualized great vessels otherwise appear normal. There are foci of coronary artery calcification evident. The pericardium is not appreciably thickened.  Mediastinum/Nodes: There is a nodular opacity in the right lobe of the thyroid measuring 1.2 x  0.8 cm. Thyroid otherwise appears unremarkable. There are scattered subcentimeter mediastinal lymph nodes, but there are no lymph nodes meeting size criteria for pathologic significance. There is a focal hiatal hernia.  Lungs/Pleura: There is mild bibasilar atelectatic change. There is no parenchymal lung edema or consolidation. No mass or nodular opacity is evident. There is no pleural thickening or pleural effusion evident.  Upper Abdomen: There is a 1 x 0.7 cm cyst in anterior segment of the right lobe of the liver peripherally. Gallbladder is absent. There is stable mild biliary duct dilatation without mass or calculus evident. There is atherosclerotic calcification in the upper abdominal aorta. Visualized upper abdominal structures otherwise appear unremarkable.  Musculoskeletal: There is degenerative change in the lower thoracic and upper lumbar spine regions. No blastic or lytic bone lesions are evident.  Review of the MIP images confirms the above findings.  IMPRESSION: No demonstrable pulmonary embolus.  Multiple foci of  atherosclerotic calcification in the aorta as well as foci of coronary artery calcification.  No edema or consolidation. No lung mass or nodule lesion. No adenopathy.  Gallbladder absent.  Stable mild biliary duct prominence.  Stable nodular opacity right lobe of thyroid which does not meet consensus guidelines criteria for further assessment.     Medications: I have reviewed the patient's current medications. Lorazepam dose is 0.5 mg as prn. Patient does not recall taking gabapentin on the night of the syncopal episodes.  DISCUSSION  At time of initial exam, encouraged pushing po fluids particularly sports drinks, should not count even decaf coffee as hydration fluid. Need knee high or preferable thigh high compression stockings, to be worn all day or day + night if she is getting up to BR at night. Discussed changing positions carefully. They will get BP cuff and check BP HR several times daily. They are to call EMS if syncopal episodes happen again at home.   With marked orthostatic drop in BP and elevation in HR now (from 118/60 with HR 80 seated to 64/38 with HR 120 standing), she was taken from exam room to infusion area by wheelchair for IVF. As RN started IVF, patient became clammy, BP down to 64/ 37 with HR 48 in recliner, remained symptomatic over next few minutes tho awake and alert, no chest pain, no SOB. Transferred to ED via stretcher.      Assessment/Plan: 1.Several syncopal episodes at home and at [facility] since cycle 3 chemo on 07-24-16. Possibly dehydration and vasovagal, but seems excessive fpr this. Now second ED evaluation this week (subsequently no findings and sent home after more IVF).  Home monitoring and Teds/ oral hydration as above. I will speak with PCP, ? also ask cardiology to see. Expect need to decrease or hold taxol cycle 4 and add IVF possibly 2 times after chemo. 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 Adjuvant carboplatin taxol begun 06-11-16, neutropenic day 9. Consider vaginal brachytherapy comcomitant with chemotherapy, timing out from each chemo treatment likely to be best tolerated. I will see her back again before cycle 4.  Expect need to decrease or hold taxol cycle 4 and add IVF possibly 2 times after chemo. 3.. neuropathic pain left inguinal area postoperatively, no clear etiology on repeat CT AP then. Improved. Slight taxol peripheral neuropathy, follow. 4.Indeterminate right renal lesion on CT 5.hypothyroid on replacement by PCP 6.post cholecystectomy and remote BTL, past GERD 7.Up to date mammograms and colonoscopy 8.Atherosclerotic thoracic and abdominal aorta  by CT 9.Degenerative arthritis spine 10.intentional weight loss in past year, ~ 40 lbs 11.difficult IV access: may need central line. She does not want this as yet. 12.flu vaccine 06-23-16 13.minimal chemo peripheral neuropathy feet and fingertips, follow.    Questions answered at time of this evaluation. MD spoke with ED triage. IVF orders placed under sign and held. Time spent 40 min including >50% acute care, coordination of care and counseling. Cc Dr Rita Ohara, update gyn onc   Evlyn Clines, MD   08/02/2016, 8:14 AM

## 2016-07-31 NOTE — ED Provider Notes (Addendum)
[place] [facility] DEPT Provider Note   CSN: [id number] Arrival date & time: 07/31/16  1402     History   Chief Complaint Chief Complaint  Patient presents with  . Hypotension    HPI Kayla Guerrero is a 81 y.o. female. She presents here after being transferred from the cancer center where she had a syncopal episode that sounds like a vagal episode.  She hasn't intermediate cancer. She received 3/6 cycles of chemotherapy 7 days ago. Seen and evaluated here 2 days ago after near syncopal episode. She had some nausea and vomiting. Underwent an extensive workup. Was not anemic. Normal CT and L. She states that she chronically feels dizzy and lightheaded upon standing. This has been worse since initiating chemotherapy.  She was at a routine appointment this morning when her physician had her stand she felt lightheaded and had to lay back down. Her heart rate had gone out. Initially laid down they were starting an IV in her arm. She complained of some nausea and had what sounds like a vagal episode with her heart rate down to the 40s and blood pressures in the 80s.  HPI  Past Medical History:  Diagnosis Date  . Arthritis   . Arthritis of knee, left   . Borderline osteopenia DEXA 2006 and 2012  . Cancer [facility])    endometrial  . Cataract   . Cholesterol serum elevated   . Colon polyps   . FHx: colon cancer   . GERD (gastroesophageal reflux disease)   . Hypothyroidism   . Obesity     Patient Active Problem List   Diagnosis Date Noted  . Chemotherapy-induced peripheral neuropathy ([place]) 07/12/2016  . Chemotherapy induced nausea and vomiting 07/12/2016  . Chemotherapy-induced neuropathy ([place]) 07/02/2016  . Chemotherapy induced neutropenia ([place]) 06/20/2016  . Poor venous access 06/20/2016  . Arthritis, senescent 06/06/2016  . Neuropathy ([place]) 06/06/2016  . Acute postoperative pain of left groin 05/16/2016  . Malignant neoplasm of endometrium ([place]) 04/23/2016  . Arthritis of knee  10/06/2013  . Macular degeneration 12/19/2012  . Left knee pain 09/15/2012  . Osteopenia 09/15/2012  . Pure hypercholesterolemia 06/11/2011  . Thyroid activity decreased 06/11/2011    Past Surgical History:  Procedure Laterality Date  . CATARACT EXTRACTION, BILATERAL    . CHOLECYSTECTOMY    . COLONOSCOPY  2010, 04/2013   Buccini  . PTERYGIUM EXCISION    . ROBOTIC ASSISTED TOTAL HYSTERECTOMY WITH BILATERAL SALPINGO OOPHERECTOMY N/A 05/06/2016   Procedure: XI ROBOTIC ASSISTED  LAPAROSCOPIC TOTAL HYSTERECTOMY WITH BILATERAL SALPINGO OOPHORECTOMY;  Surgeon: Nancy Marus, MD;  Location: [facility] ORS;  Service: Gynecology;  Laterality: N/A;  . SENTINEL NODE BIOPSY N/A 05/06/2016   Procedure: SENTINEL NODE BIOPSY;  Surgeon: Nancy Marus, MD;  Location: [facility] ORS;  Service: Gynecology;  Laterality: N/A;  . TUBAL LIGATION      OB History    Gravida Para Term Preterm AB Living   3 2     1 1    SAB TAB Ectopic Multiple Live Births   1               Home Medications    Prior to Admission medications   Medication Sig Start Date End Date Taking? Authorizing Provider  calcium carbonate (OS-CAL) 600 MG TABS Take 600 mg by mouth daily.      Historical Provider, MD  Cholecalciferol (VITAMIN D) 2000 UNITS tablet Take 2,000 Units by mouth daily.    Historical Provider, MD  dexamethasone (DECADRON) 4 MG tablet  Take 5 tablets with food 12 hrs and 6 hrs prior to Taxol Chemotherapy. 07/31/16   Lennis Marion Downer, MD  fish oil-omega-3 fatty acids 1000 MG capsule Take 1 g by mouth daily.     Historical Provider, MD  gabapentin (NEURONTIN) 100 MG capsule Take 1 capsule (100 mg total) by mouth 3 (three) times daily. 05/28/16   Nancy Marus, MD  glucosamine-chondroitin 500-400 MG tablet Take 1 tablet by mouth 2 (two) times daily.     Historical Provider, MD  levothyroxine (SYNTHROID, LEVOTHROID) 88 MCG tablet TAKE ONE TABLET BY MOUTH ONCE DAILY BEFORE  BREAKFAST 07/10/16   Rita Ohara, MD  loratadine (CLARITIN) 10 MG tablet  Take 10 mg by mouth daily as needed for allergies.    Historical Provider, MD  LORazepam (ATIVAN) 0.5 MG tablet Place 1 tablet under the tongue or swallow every 6 hrs as needed for nausea. Will make drowsy. 07/31/16   Lennis Marion Downer, MD  Magnesium 400 MG CAPS Take 1 capsule by mouth daily.      Historical Provider, MD  Multiple Vitamin (MULTI-VITAMIN) tablet Take 1 tablet by mouth daily.      Historical Provider, MD  naproxen sodium (ANAPROX) 220 MG tablet Take 220 mg by mouth 2 (two) times daily with a meal.    Historical Provider, MD  ondansetron (ZOFRAN) 8 MG tablet Take 1 tablet (8 mg total) by mouth every 8 (eight) hours as needed for nausea (Will not make drowsy.). 06/06/16   Lennis Marion Downer, MD  oxyCODONE-acetaminophen (PERCOCET/ROXICET) 5-325 MG tablet Take 1-2 tablets by mouth every 4 (four) hours as needed (moderate to severe pain). Patient not taking: Reported on 07/31/2016 05/28/16   Nancy Marus, MD  polyethylene glycol (MIRALAX / Floria Raveling) packet Take 17 g by mouth 2 (two) times daily as needed for mild constipation.     Historical Provider, MD  Red Yeast Rice Extract (RED YEAST RICE PO) Take 1 tablet by mouth 3 (three) times daily.     Historical Provider, MD  senna-docusate (SENOKOT S) 8.6-50 MG tablet Take 1 tablet by mouth 2 (two) times daily as needed for mild constipation.    Historical Provider, MD  terbinafine (LAMISIL) 250 MG tablet Take 1 tablet (250 mg total) by mouth daily. Patient not taking: Reported on 07/31/2016 05/15/16   Max T Hyatt, DPM  Wheat Dextrin (BENEFIBER DRINK MIX PO) Take by mouth 2 (two) times daily.    Historical Provider, MD    Family History Family History  Problem Relation Age of Onset  . Cancer Mother 10    COLON   . Heart disease Mother   . Cancer Maternal Aunt   . Breast cancer Maternal Aunt   . Mental illness Maternal Uncle   . Cancer Maternal Grandmother     stomach  . Macular degeneration Maternal Grandmother   . Diabetes Neg Hx      Social History Social History  Substance Use Topics  . Smoking status: Former Smoker    Types: Cigarettes    Quit date: 09/29/1980  . Smokeless tobacco: Never Used  . Alcohol use 0.0 oz/week     Comment: 1 glass of wine 3-4 times/week; hard liquor (highball) or a beer once a week     Allergies   Levaquin [levofloxacin hemihydrate]; Promethazine hcl; and Sulfa drugs cross reactors   Review of Systems Review of Systems  Constitutional: Positive for fatigue. Negative for appetite change, chills, diaphoresis and fever.  HENT: Negative for mouth sores, sore throat  and trouble swallowing.   Eyes: Negative for visual disturbance.  Respiratory: Negative for cough, chest tightness, shortness of breath and wheezing.   Cardiovascular: Negative for chest pain.  Gastrointestinal: Negative for abdominal distention, abdominal pain, diarrhea, nausea and vomiting.  Endocrine: Negative for polydipsia, polyphagia and polyuria.  Genitourinary: Negative for dysuria, frequency and hematuria.  Musculoskeletal: Negative for gait problem.  Skin: Negative for color change, pallor and rash.  Neurological: Positive for dizziness and weakness. Negative for syncope, light-headedness and headaches.  Hematological: Does not bruise/bleed easily.  Psychiatric/Behavioral: Negative for behavioral problems and confusion.     Physical Exam Updated Vital Signs BP 131/72 (BP Location: Left Arm)   Pulse 70   Temp 97.9 F (36.6 C) (Oral)   Resp 13   Ht 5\' 6"  (1.676 m)   Wt 170 lb (77.1 kg)   SpO2 98%   BMI 27.44 kg/m   Physical Exam  Constitutional: She is oriented to person, place, and time. She appears well-developed and well-nourished. No distress.  HENT:  Head: Normocephalic.  Eyes: Conjunctivae are normal. Pupils are equal, round, and reactive to light. No scleral icterus.  Conjunctivae are not pale.  Neck: Normal range of motion. Neck supple. No thyromegaly present.  Cardiovascular: Normal rate  and regular rhythm.  Exam reveals no gallop and no friction rub.   No murmur heard. Pulmonary/Chest: Effort normal and breath sounds normal. No respiratory distress. She has no wheezes. She has no rales.  Abdominal: Soft. Bowel sounds are normal. She exhibits no distension. There is no tenderness. There is no rebound.  Musculoskeletal: Normal range of motion.  Neurological: She is alert and oriented to person, place, and time.  Skin: Skin is warm and dry. No rash noted.  Psychiatric: She has a normal mood and affect. Her behavior is normal.     ED Treatments / Results  Labs (all labs ordered are listed, but only abnormal results are displayed) Labs Reviewed - No data to display  EKG  EKG Interpretation None       Radiology No results found.  Procedures Procedures (including critical care time)  Medications Ordered in ED Medications  0.9 %  sodium chloride infusion ( Intravenous New Bag/Given 07/31/16 1438)  0.9 %  sodium chloride infusion ( Intravenous New Bag/Given 07/31/16 1438)     Initial Impression / Assessment and Plan / ED Course  I have reviewed the triage vital signs and the nursing notes.  Pertinent labs & imaging results that were available during my care of the patient were reviewed by me and considered in my medical decision making (see chart for details).  Clinical Course    Heart rate of 80. Systolic blood pressure 0000000. Labs earlier today show hemoglobin stable at 11. Kidney functions normal.  Will give 2 L of fluid. Reassess. Likely vagal episode. Likely dehydration secondary to poor by mouth intake.  Final Clinical Impressions(s) / ED Diagnoses   Final diagnoses:  Hypotension, unspecified hypotension type    New Prescriptions New Prescriptions   No medications on file   Patient was given IV fluids. However she complained IV "hurt". His clearly infusing well. Patient called the cancer center. [place] color charger. First nurses discussed  this further with the patient. She refuses a repeat IV and would simply like to "go home". I think this is appropriate. She's been given standard cc of IV fluid. She has no resting tachycardia. Her studies are reassuring for earlier the day. Symptoms sound like poor by mouth  intake with orthostasis and a likely vagal episode with IV start.    Tanna Furry, MD 07/31/16 [place], MD 07/31/16 [phone]

## 2016-07-31 NOTE — ED Triage Notes (Signed)
Patient is coming in from cancer center for hypotension.  She was dizzy.

## 2016-07-31 NOTE — Telephone Encounter (Signed)
Kayla Guerrero is calling from the [facility] ED to see if Kayla Guerrero needs her Granix injection today. Told him that Dr. Marko Plume said that her counts were fine today and she did not need the injection today.

## 2016-08-01 ENCOUNTER — Telehealth: Payer: Self-pay | Admitting: Oncology

## 2016-08-01 ENCOUNTER — Ambulatory Visit (HOSPITAL_BASED_OUTPATIENT_CLINIC_OR_DEPARTMENT_OTHER): Payer: Medicare Other

## 2016-08-01 ENCOUNTER — Telehealth: Payer: Self-pay

## 2016-08-01 ENCOUNTER — Other Ambulatory Visit: Payer: Self-pay | Admitting: Oncology

## 2016-08-01 VITALS — BP 146/64 | HR 85 | Temp 98.7°F | Resp 20

## 2016-08-01 DIAGNOSIS — C541 Malignant neoplasm of endometrium: Secondary | ICD-10-CM | POA: Diagnosis not present

## 2016-08-01 DIAGNOSIS — Z5189 Encounter for other specified aftercare: Secondary | ICD-10-CM | POA: Diagnosis not present

## 2016-08-01 MED ORDER — TBO-FILGRASTIM 300 MCG/0.5ML ~~LOC~~ SOSY
300.0000 ug | PREFILLED_SYRINGE | Freq: Once | SUBCUTANEOUS | Status: AC
  Administered 2016-08-01: 300 ug via SUBCUTANEOUS
  Filled 2016-08-01: qty 0.5

## 2016-08-01 NOTE — Telephone Encounter (Signed)
Gave Kayla Guerrero the appointment date of 08-08-16 at 0830 for lab and then visit with Dr. Marko Plume. Kayla Harada verbalized understanding.

## 2016-08-01 NOTE — Telephone Encounter (Signed)
Medical Oncology  MD LM for patient on home and cell phones asking her to call back to office.  Need to let her know to come for Granix today at 3:00, as injection not given 11- Need to see how she is feeling today. She should get Ted stockings, at least knee high, and wear any time she is up. She should get home BP cuff and check BP and HR 3x daily.  Need to let her know that I have asked her PCP Dr Tomi Bamberger to call me back to discuss. I would like to get cardiology to see patient, will discuss this with Dr Tomi Bamberger.   Schedulers will also set her up to see me on 11-10 + lab. (next chemo 11-16).   Godfrey Pick, MD

## 2016-08-01 NOTE — Telephone Encounter (Signed)
Medical Oncology  Dr Redmond School returned my call as Dr Tomi Bamberger not available.  I told him recurrent syncopal episodes, possibly dehydration and/ or vasovagal, but not clear to me. He will get patient seen at their office next week, possibly cardiology evaluation.  Godfrey Pick, MD

## 2016-08-01 NOTE — Progress Notes (Signed)
VSS today. Alert, skin warm  And dry. Pt stated that she is drinking large amts  Of Propel Sports Drink. Encouraged to push PO fluids and eat small frequent meals again. Pt's husband stated that they had purchased a Blood Pressure Monitor. Enforced  Need for monitoring BP several times a day and any time that patient become dizzy or light headed.  Encouraged to follow up with Cardiologist as per Dr. Marko Plume note. Pt and husband verbalized understanding of instructs today. Denies the need for IV fluids at this time.

## 2016-08-01 NOTE — Telephone Encounter (Signed)
Medical Oncology  Called Dr Tera Helper Knapp's office [phone] to discuss situation with syncopal episodes. Office closed for lunch, LM requesting that physician return call to my office cell.  Godfrey Pick, MD

## 2016-08-01 NOTE — Patient Instructions (Signed)

## 2016-08-02 ENCOUNTER — Encounter: Payer: Self-pay | Admitting: Oncology

## 2016-08-02 DIAGNOSIS — I951 Orthostatic hypotension: Secondary | ICD-10-CM | POA: Insufficient documentation

## 2016-08-02 NOTE — Progress Notes (Signed)
Medical Oncology    Message sent to schedulers to let them know that scheduling not accomplished at visit 07-31-16    these did not get scheduled from visit on 11-2, as of 11-4. Note patient had to be taken to ED so did not go to schedulers that dai    (LL 11-13 + lab does not need since now to see LL + lab on 11-10)  NEEDS THESE:  IVF 11-18 and 11-20 (IVF needed these days due to ED visit last cycle)  LL 11-20 OK to use new patient spot if needed, OK to see in infusion if needed  Change granix to 11-20, 11-21, 11-22 (none on 11-24)     L.Marko Plume

## 2016-08-04 ENCOUNTER — Ambulatory Visit (INDEPENDENT_AMBULATORY_CARE_PROVIDER_SITE_OTHER): Payer: Medicare Other | Admitting: Family Medicine

## 2016-08-04 ENCOUNTER — Encounter: Payer: Self-pay | Admitting: Family Medicine

## 2016-08-04 VITALS — BP 134/76 | HR 84 | Ht 66.0 in | Wt 176.2 lb

## 2016-08-04 DIAGNOSIS — T451X5A Adverse effect of antineoplastic and immunosuppressive drugs, initial encounter: Secondary | ICD-10-CM | POA: Diagnosis not present

## 2016-08-04 DIAGNOSIS — G620 Drug-induced polyneuropathy: Secondary | ICD-10-CM

## 2016-08-04 DIAGNOSIS — C541 Malignant neoplasm of endometrium: Secondary | ICD-10-CM

## 2016-08-04 DIAGNOSIS — R55 Syncope and collapse: Secondary | ICD-10-CM

## 2016-08-04 NOTE — Progress Notes (Signed)
Chief Complaint  Patient presents with  . Follow-up    having syncopal episodes during chemo-here to follow up.     Patient presents for evaluation after syncopal episodes.  She is accompanied by her husband.  She reports she is overall doing well, but has been feeling weak.  10/29 when coming out of the bathroom, she fainted, jewelry chest fell on her.  Bruising, no other injury.  She was unable to get up, crawled to the bed.  She was able to get into bed after about an hour. She was okay the next day, just a little weak. She vomited after falling/fainting. Tuesday she went to cancer center for an injection. After the injection she had another "vagal episode"--eyes rolled back, unresponsive.  She was sent to the ER.  They had trouble getting in the IV, couldn't get it to flow, it was very slow, ended up getting about 1/2 bag.  Last week went to cancer center for IV fluids--BP bottomed out when they were putting in the IV; sent her to ER and got IV fluids there. She had said no to a portacath She is halfway through and prefers to avoid, if possible.  She currently feels "okay".  She gets dizzy if she gets up too fast, feels weak in general. She is wearing support hose. She is drinking a lot of sports drinks.  Appetite has been good. No diarrhea, slight constipation (taking Miralax to control). Some nausea.  Last emesis was the night she passed out at home (after she had passed out).  She has 3 chemo sessions left (every 3 weeks), next is scheduled for 11/16; she starts radiation tomorrow (x 5 weeks).  She had only been taking the gabapentin sporadically, prn nerve pain.  Just restarted it three times daily yesterday.  She has numbness/tingling in her fingers and toes.  Denies significant side effects, sedation. Some discomfort at night, feet feeling like "frozen clumps"   Immunization History  Administered Date(s) Administered  . H1N1 06/21/2009  . Influenza Split 06/19/2011, 06/22/2012   . Influenza Whole 07/12/2010  . Influenza, High Dose Seasonal PF 07/06/2013, 07/21/2014, 07/10/2015  . Influenza,inj,Quad PF,36+ Mos 06/23/2016  . Pneumococcal Conjugate-13 10/12/2014  . Pneumococcal Polysaccharide-23 08/20/2010  . Tdap 12/12/2010  . Zoster 09/30/2011   PMH, [place] [facility] reviewed  Outpatient Encounter Prescriptions as of 08/04/2016  Medication Sig  . Cholecalciferol (VITAMIN D) 2000 UNITS tablet Take 2,000 Units by mouth daily.  [name] dexamethasone (DECADRON) 4 MG tablet Take 5 tablets with food 12 hrs and 6 hrs prior to Taxol Chemotherapy. (Patient taking differently: Take 20 mg by mouth as directed. Take 5 tablets= 20mg  with food 12 hrs and 6 hrs prior to Taxol Chemotherapy.)  . gabapentin (NEURONTIN) 100 MG capsule Take 1 capsule (100 mg total) by mouth 3 (three) times daily. (Patient taking differently: Take 100 mg by mouth 3 (three) times daily as needed (pain). )  . glucosamine-chondroitin 500-400 MG tablet Take 1 tablet by mouth 2 (two) times daily.   [name] levothyroxine (SYNTHROID, LEVOTHROID) 88 MCG tablet TAKE ONE TABLET BY MOUTH ONCE DAILY BEFORE  BREAKFAST (Patient taking differently: TAKE 71mcg TABLET BY MOUTH ONCE DAILY BEFORE  BREAKFAST)  . loratadine (CLARITIN) 10 MG tablet Take 10 mg by mouth daily as needed for allergies.  [name] LORazepam (ATIVAN) 0.5 MG tablet Place 1 tablet under the tongue or swallow every 6 hrs as needed for nausea. Will make drowsy. (Patient taking differently: Take 0.5 mg by mouth every 6 (  six) hours as needed (nausea). )  . Magnesium 400 MG CAPS Take 1 capsule by mouth daily.    . Multiple Vitamin (MULTI-VITAMIN) tablet Take 1 tablet by mouth daily.    . ondansetron (ZOFRAN) 8 MG tablet Take 1 tablet (8 mg total) by mouth every 8 (eight) hours as needed for nausea (Will not make drowsy.).  [name] polyethylene glycol (MIRALAX / GLYCOLAX) packet Take 17 g by mouth 2 (two) times daily as needed for mild constipation.   . Red Yeast Rice Extract (RED YEAST RICE  PO) Take 1-2 tablets by mouth 2 (two) times daily. Take 1 tablet in the morning and then take 2 tablets with dinner  . senna-docusate (SENOKOT S) 8.6-50 MG tablet Take 1 tablet by mouth 2 (two) times daily as needed for mild constipation.  . Wheat Dextrin (BENEFIBER DRINK MIX PO) Take 1 scoop by mouth 2 (two) times daily.   . naproxen sodium (ANAPROX) 220 MG tablet Take 220 mg by mouth 2 (two) times daily as needed (pain).   [name] oxyCODONE-acetaminophen (PERCOCET/ROXICET) 5-325 MG tablet Take 1-2 tablets by mouth every 4 (four) hours as needed (moderate to severe pain). (Patient not taking: Reported on 08/04/2016)  . [DISCONTINUED] terbinafine (LAMISIL) 250 MG tablet Take 1 tablet (250 mg total) by mouth daily. (Patient not taking: Reported on 07/31/2016)   Facility-Administered Encounter Medications as of 08/04/2016  Medication  . influenza  inactive virus vaccine (FLUZONE/FLUARIX) injection 0.5 mL   ROS:  Denies fever, chills, URI symptoms, cough. No chest pain, shortness of breath, urinary complaints or other concerns.  See HPI.  PHYSICAL EXAM:  BP (!) 160/90 (BP Location: Right Arm, Patient Position: Sitting, Cuff Size: Normal)   Pulse 84   Ht 5\' 6"  (1.676 m)   Wt 176 lb 3.2 oz (79.9 kg)   BMI 28.44 kg/m   134/76 on repeat by MD, sitting  Orthostatics taken by nurse: Laying 160/80 P 80 Sitting 130/80 P 84 Standing 110/60 P76  Pleasant female, wearing scarf on head, appearing slightly tired, but otherwise in good spirits. She is alert, oriented, excellent historian. HEENT: PERRL, EOMI, conjunctiva and sclera are clear.  OP--moist mucus membranes without lesions Neck: no lymphadenopathy or mass Heart: regular rate and rhythm Lungs: clear bilaterally Abdomen: soft, nontender Extremities: no edema (wearing support stockings) Psych: normal mood, affect, hygiene and grooming Neuro: alert and oriented.  Normal strength, gait Skin: normal turgor, no lesions  Lab Results  Component  Value Date   WBC 3.9 07/31/2016   HGB 11.0 (L) 07/31/2016   HCT 33.4 (L) 07/31/2016   MCV 89.8 07/31/2016   PLT 221 07/31/2016     Chemistry      Component Value Date/Time   NA 138 07/31/2016 1046   K 4.6 07/31/2016 1046   CL 104 07/29/2016 1213   CO2 26 07/31/2016 1046   BUN 14.2 07/31/2016 1046   CREATININE 0.7 07/31/2016 1046      Component Value Date/Time   CALCIUM 8.6 07/31/2016 1046   ALKPHOS 47 07/29/2016 1213   ALKPHOS 70 07/24/2016 0840   AST 20 07/29/2016 1213   AST 14 07/24/2016 0840   ALT 11 (L) 07/29/2016 1213   ALT 7 07/24/2016 0840   BILITOT 0.8 07/29/2016 1213   BILITOT <0.22 07/24/2016 0840        ASSESSMENT/PLAN:  Syncope, unspecified syncope type - orthostatic by BP, not pulse.  Asymptomatic with BP drop in office. Continue hydration. Will discuss with Dr. Marko Plume (?if autonomic SE  from chemo?)  Malignant neoplasm of endometrium ([place])  Chemotherapy-induced peripheral neuropathy ([facility]) - discussed regular use of gabapentin rather than prn  ER notes and cancer center notes reviewed. Recent labs reviewed. Continue hydration. Will discuss with Dr. Marko Plume (?if any autonomic neuropathy as side effect of chemo).  Consider cardiology consult if ongoing symptoms despite hydration, etc.     Consider pneumovax booster sometime in the near future (you can ask at Cancer center, and they may give it to you there, otherwise at your next check-up here).

## 2016-08-05 ENCOUNTER — Telehealth: Payer: Self-pay | Admitting: *Deleted

## 2016-08-05 ENCOUNTER — Encounter: Payer: Self-pay | Admitting: Family Medicine

## 2016-08-05 ENCOUNTER — Telehealth: Payer: Self-pay | Admitting: Oncology

## 2016-08-05 NOTE — Telephone Encounter (Signed)
Medical Oncology  Returned call to Dr Rita Ohara to discuss, as she evaluated patient at her office on 11-6.  Exam then remarkable for orthostatic BP lying 160/80 HR 80 and standing 110/60  HR 76, with patient wearing compression hose and reportedly pushing po sports drinks.  Patient denied any symptoms then.  Consider autonomic neuropathy, taxol, vasovagal, dehydration.   Expect to give carboplatin only for cycle 4, with IVF after x 2 days.  From standpoint of adjuvant therapy for IB serous carcinoma of endometrium, would be best to also use taxane to complete planned treatment, however if taxane is causing the acute problems, may not be possible.   I will discuss with patient at my visit on 08-08-16.  Cycle 4 chemo due 08-14-16.  IVF requested 11-18 and 11-20, not yet in EMR, another scheduling request sent now.  Godfrey Pick, MD

## 2016-08-05 NOTE — Telephone Encounter (Signed)
CALLED PATIENT TO REMIND OF HDR VAG. CUFF CASE FOR 08-06-16, LVM FOR A RETURN CALL

## 2016-08-06 ENCOUNTER — Ambulatory Visit
Admission: RE | Admit: 2016-08-06 | Discharge: 2016-08-06 | Disposition: A | Discharge status: Home / Self Care | Payer: Medicare Other | Source: Ambulatory Visit | Attending: Radiation Oncology | Admitting: Radiation Oncology

## 2016-08-06 ENCOUNTER — Encounter: Payer: Self-pay | Admitting: Radiation Oncology

## 2016-08-06 DIAGNOSIS — K449 Diaphragmatic hernia without obstruction or gangrene: Secondary | ICD-10-CM | POA: Diagnosis not present

## 2016-08-06 DIAGNOSIS — C541 Malignant neoplasm of endometrium: Secondary | ICD-10-CM

## 2016-08-06 DIAGNOSIS — E041 Nontoxic single thyroid nodule: Secondary | ICD-10-CM | POA: Insufficient documentation

## 2016-08-06 DIAGNOSIS — K7689 Other specified diseases of liver: Secondary | ICD-10-CM | POA: Diagnosis not present

## 2016-08-06 DIAGNOSIS — I2510 Atherosclerotic heart disease of native coronary artery without angina pectoris: Secondary | ICD-10-CM | POA: Insufficient documentation

## 2016-08-06 DIAGNOSIS — R3915 Urgency of urination: Secondary | ICD-10-CM | POA: Diagnosis not present

## 2016-08-06 DIAGNOSIS — I700 Atherosclerosis of aorta: Secondary | ICD-10-CM | POA: Insufficient documentation

## 2016-08-06 NOTE — Progress Notes (Signed)
  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  Identity was confirmed.  All relevant records and images related to the planned course of therapy were reviewed.  The patient freely provided informed written consent to proceed with treatment after reviewing the details related to the planned course of therapy. The consent form was witnessed and verified by the simulation staff.  Then, the patient was set-up in a stable reproducible  supine position for radiation therapy.  CT images were obtained.  Surface markings were placed.  The CT images were loaded into the planning software.  Then the target and avoidance structures were contoured.  Treatment planning then occurred.  The radiation prescription was entered and confirmed.   I have requested : Brachytherapy Isodose Plan and Dosimetry Calculations to plan the radiation distribution.    PLAN:  The patient will receive 30 Gy in 5 fractions. Patient will be treated with iridium 192 as the high-dose-rate source. Treatment length will be 3 cm. Prescription will be to the mucosal surface.    ________________________________  Blair Promise, PhD, MD   This document serves as a record of services personally performed by Gery Pray, MD. It was created on his behalf by Maryla Morrow, a trained medical scribe. The creation of this record is based on the scribe's personal observations and the provider's statements to them. This document has been checked and approved by the attending provider.

## 2016-08-06 NOTE — Progress Notes (Signed)
  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 She will be treated with a 3 cm diameter segmented cylinder. This conforms to her anatomy without undue discomfort.  Vaginal brachytherapy procedure node: The patient was brought to the [facility] suite. Identity was confirmed. All relevant records and images related to the planned course of therapy were reviewed. The patient freely provided informed written consent to proceed with treatment after reviewing the details related to the planned course of therapy. The consent form was witnessed and verified by the simulation staff. Then, the patient was set-up in a stable reproducible supine position for radiation therapy. The patient's custom vaginal cylinder was placed in the proximal vagina. This was affixed to the CT/MR stabilization plate to prevent slippage. Patient tolerated the placement well.  Verification simulation note:  A fiducial marker was placed within the vaginal cylinder. An AP and lateral film was then obtained through the pelvis area. This documented accurate position of the vaginal cylinder for treatment.  HDR BRACHYTHERAPY TREATMENT  The remote afterloading device was affixed to the vaginal cylinder by catheter. Patient then proceeded to undergo her first high-dose-rate treatment directed at the proximal vagina. The patient was prescribed a dose of 30 gray to be delivered to the mucosal surface. Treatment length was 3 cm. Patient was treated with 1 channel using 7 dwell positions. Treatment time was 210.8 seconds. Iridium 192 was the high-dose-rate source for treatment. The patient tolerated the treatment well.  After completion of her therapy, a radiation survey was performed documenting return of the iridium source into the [employer] safe.   PLAN: She will return next week for her second high-dose-rate treatment.  ________________________________  Blair Promise, PhD, MD   This document serves as a record of services personally performed by Gery Pray, MD. It was created on his behalf by Maryla Morrow, a trained medical scribe. The creation of this record is based on the scribe's personal observations and the provider's statements to them. This document has been checked and approved by the attending provider.

## 2016-08-06 NOTE — Progress Notes (Signed)
Please see the Nurse Progress Note in the MD Initial Consult Encounter for this patient. 

## 2016-08-06 NOTE — Progress Notes (Addendum)
Kayla Guerrero is here for follow up/hdr.  She denies having pain.  She reports having urinary urgency and frequency that she said is worse since she started chemotherapy.  She denies having any bowel issues.  She denies having any vaginal bleeding but does report seeing occasional blood in her stools.  She reports feeling weak.  She had chemotherapy on 07/24/16 and will have her next infusion on 08/14/16.  BP (!) 159/85   Pulse 91   Temp 97.9 F (36.6 C) (Oral)   Ht 5\' 6"  (1.676 m)   Wt 176 lb 12.8 oz (80.2 kg)   SpO2 99%   BMI 28.54 kg/m    Wt Readings from Last 3 Encounters:  08/06/16 176 lb 12.8 oz (80.2 kg)  08/04/16 176 lb 3.2 oz (79.9 kg)  07/31/16 170 lb (77.1 kg)    .[name]

## 2016-08-06 NOTE — Progress Notes (Signed)
Radiation Oncology         [phone] ________________________________  Name: Kayla Guerrero MRN: [id number]  Date: 08/06/2016  DOB: 09-May-1935  Vaginal Brachytherapy Procedure Note  CC: KNAPP,EVE A, MD [name], MD    ICD-9-CM ICD-10-CM   1. 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 She received 3 cycles of adjuvant chemotherapy and has tolerated that well except for generalized fatigue. The patient denies having any pain. She does report urinary urgency and frequency that she says is worse since she began chemotherapy. She denies any bowel issues. The patient denies any vaginal bleeding, but does reports seeing occasional blood in her stool. She had chemotherapy on 07/24/16 and will have her next infusion on 08/14/16.  ALLERGIES: is allergic to levaquin [levofloxacin hemihydrate]; promethazine hcl; and sulfa drugs cross reactors.  Meds: Current Outpatient Prescriptions  Medication Sig Dispense Refill  . Cholecalciferol (VITAMIN D) 2000 UNITS tablet Take 2,000 Units by mouth daily.    [name] dexamethasone (DECADRON) 4 MG tablet Take 5 tablets with food 12 hrs and 6 hrs prior to Taxol Chemotherapy. (Patient taking differently: Take 20 mg by mouth as directed. Take 5 tablets= 20mg  with food 12 hrs and 6 hrs prior to Taxol Chemotherapy.) 30 tablet 0  . gabapentin (NEURONTIN) 100 MG capsule Take 1 capsule (100 mg total) by mouth 3 (three) times daily. (Patient taking differently: Take 100 mg by mouth 3 (three) times daily as needed (pain). ) 60 capsule 0  . glucosamine-chondroitin 500-400 MG tablet Take 1 tablet by mouth 2 (two) times daily.     [name] levothyroxine (SYNTHROID, LEVOTHROID) 88 MCG tablet TAKE ONE TABLET BY MOUTH ONCE DAILY BEFORE  BREAKFAST (Patient taking differently: TAKE 12mcg TABLET BY MOUTH ONCE DAILY BEFORE  BREAKFAST) 90  tablet 0  . loratadine (CLARITIN) 10 MG tablet Take 10 mg by mouth daily as needed for allergies.    [name] LORazepam (ATIVAN) 0.5 MG tablet Place 1 tablet under the tongue or swallow every 6 hrs as needed for nausea. Will make drowsy. (Patient taking differently: Take 0.5 mg by mouth every 6 (six) hours as needed (nausea). ) 20 tablet 0  . Magnesium 400 MG CAPS Take 1 capsule by mouth daily.      . Multiple Vitamin (MULTI-VITAMIN) tablet Take 1 tablet by mouth daily.      . naproxen sodium (ANAPROX) 220 MG tablet Take 220 mg by mouth 2 (two) times daily as needed (pain).     . ondansetron (ZOFRAN) 8 MG tablet Take 1 tablet (8 mg total) by mouth every 8 (eight) hours as needed for nausea (Will not make drowsy.). 30 tablet 0  . polyethylene glycol (MIRALAX / GLYCOLAX) packet Take 17 g by mouth 2 (two) times daily as needed for mild constipation.     . Red Yeast Rice Extract (RED YEAST RICE PO) Take 1-2 tablets by mouth 2 (two) times daily. Take 1 tablet in the morning and then take 2 tablets with dinner    . senna-docusate (SENOKOT S) 8.6-50 MG tablet Take 1 tablet by mouth 2 (two) times daily as needed for mild constipation.    [name] oxyCODONE-acetaminophen (PERCOCET/ROXICET) 5-325 MG tablet Take 1-2 tablets by mouth every 4 (four) hours as needed (moderate to severe pain). (Patient not taking: Reported on 08/06/2016) 45 tablet 0  . Wheat Dextrin (BENEFIBER DRINK MIX PO) Take  1 scoop by mouth 2 (two) times daily.      No current facility-administered medications for this encounter.    Facility-Administered Medications Ordered in Other Encounters  Medication Dose Route Frequency Provider Last Rate Last Dose  . influenza  inactive virus vaccine (FLUZONE/FLUARIX) injection 0.5 mL  0.5 mL Intramuscular Once Rita Ohara, MD        Physical Findings: The patient is in no acute distress. Patient is alert and oriented.  height is 5\' 6"  (1.676 m) and weight is 176 lb 12.8 oz (80.2 kg). Her oral temperature is 97.9  F (36.6 C). Her blood pressure is 159/85 (abnormal) and her pulse is 91. Her oxygen saturation is 99%.   No palpable cervical, supraclavicular or axillary lymphoadenopathy. The heart has a regular rate and rhythm. The lungs are clear to auscultation. Abdomen soft and non-tender.  On pelvic examination the external genitalia were unremarkable. A speculum exam was performed. Vaginal cuff intact, no mucosal lesions. On bimanual exam there were no pelvic masses appreciated.  Patient was fitted for a vaginal cylinder. The patient will be treated with a 3 cm diameter cylinder with a treatment length of 3.0 cm. This distended the vaginal vault without undue discomfort. The patient tolerated the procedure well.   Lab Findings: Lab Results  Component Value Date   WBC 3.9 07/31/2016   HGB 11.0 (L) 07/31/2016   HCT 33.4 (L) 07/31/2016   MCV 89.8 07/31/2016   PLT 221 07/31/2016    Radiographic Findings: Ct Angio Chest Pe W And/or Wo Contrast  Result Date: 07/29/2016 CLINICAL DATA:  Shortness of breath.  Endometrial carcinoma EXAM: CT ANGIOGRAPHY CHEST WITH CONTRAST TECHNIQUE: Multidetector CT imaging of the chest was performed using the standard protocol during bolus administration of intravenous contrast. Multiplanar CT image reconstructions and MIPs were obtained to evaluate the vascular anatomy. CONTRAST:  100 mL Isovue 370 nonionic COMPARISON:  Chest CT April 30, 2016 FINDINGS: Cardiovascular: There is no demonstrable pulmonary embolus. There is no thoracic aortic aneurysm or dissection. There is moderate atherosclerotic calcification throughout the aorta. There is calcification at the origins of the left common carotid and left subclavian arteries. Visualized great vessels otherwise appear normal. There are foci of coronary artery calcification evident. The pericardium is not appreciably thickened. Mediastinum/Nodes: There is a nodular opacity in the right lobe of the thyroid measuring 1.2 x 0.8  cm. Thyroid otherwise appears unremarkable. There are scattered subcentimeter mediastinal lymph nodes, but there are no lymph nodes meeting size criteria for pathologic significance. There is a focal hiatal hernia. Lungs/Pleura: There is mild bibasilar atelectatic change. There is no parenchymal lung edema or consolidation. No mass or nodular opacity is evident. There is no pleural thickening or pleural effusion evident. Upper Abdomen: There is a 1 x 0.7 cm cyst in anterior segment of the right lobe of the liver peripherally. Gallbladder is absent. There is stable mild biliary duct dilatation without mass or calculus evident. There is atherosclerotic calcification in the upper abdominal aorta. Visualized upper abdominal structures otherwise appear unremarkable. Musculoskeletal: There is degenerative change in the lower thoracic and upper lumbar spine regions. No blastic or lytic bone lesions are evident. Review of the MIP images confirms the above findings. IMPRESSION: No demonstrable pulmonary embolus. Multiple foci of atherosclerotic calcification in the aorta as well as foci of coronary artery calcification. No edema or consolidation. No lung mass or nodule lesion. No adenopathy. Gallbladder absent.  Stable mild biliary duct prominence. Stable nodular opacity right lobe of thyroid  which does not meet consensus guidelines criteria for further assessment. Electronically Signed   By: Lowella Grip III M.D.   On: 07/29/2016 14:26   Mm Screening Breast Tomo Bilateral  Result Date: 07/17/2016 CLINICAL DATA:  Screening. EXAM: 2D DIGITAL SCREENING BILATERAL MAMMOGRAM WITH CAD AND ADJUNCT TOMO COMPARISON:  Previous exam(s). ACR Breast Density Category b: There are scattered areas of fibroglandular density. FINDINGS: There are no findings suspicious for malignancy. Images were processed with CAD. IMPRESSION: No mammographic evidence of malignancy. A result letter of this screening mammogram will be mailed directly  to the patient. RECOMMENDATION: Screening mammogram in one year. (Code:SM-B-01Y) BI-RADS CATEGORY  1: Negative. Electronically Signed   By: Fidela Salisbury M.D.   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 The patient was successfully fitted for a vaginal cylinder. The patient is appropriate to begin vaginal brachytherapy.    Plan: The patient will proceed with CT simulation and vaginal brachytherapy today.  She will receive 5 HDR treatments with prescription of 30 Gy to the mucosal surface.  _______________________________    Blair Promise, PhD, MD  This document serves as a record of services personally performed by Gery Pray, MD. It was created on his behalf by Maryla Morrow, a trained medical scribe. The creation of this record is based on the scribe's personal observations and the provider's statements to them. This document has been checked and approved by the attending provider.

## 2016-08-08 ENCOUNTER — Other Ambulatory Visit (HOSPITAL_BASED_OUTPATIENT_CLINIC_OR_DEPARTMENT_OTHER): Payer: Medicare Other

## 2016-08-08 ENCOUNTER — Other Ambulatory Visit: Payer: Self-pay | Admitting: Radiology

## 2016-08-08 ENCOUNTER — Telehealth: Payer: Self-pay | Admitting: Oncology

## 2016-08-08 ENCOUNTER — Encounter: Payer: Self-pay | Admitting: Oncology

## 2016-08-08 ENCOUNTER — Ambulatory Visit (HOSPITAL_BASED_OUTPATIENT_CLINIC_OR_DEPARTMENT_OTHER): Payer: Medicare Other | Admitting: Oncology

## 2016-08-08 VITALS — BP 162/75 | HR 87 | Temp 97.4°F | Resp 18 | Ht 66.0 in | Wt 176.8 lb

## 2016-08-08 DIAGNOSIS — N289 Disorder of kidney and ureter, unspecified: Secondary | ICD-10-CM

## 2016-08-08 DIAGNOSIS — R55 Syncope and collapse: Secondary | ICD-10-CM | POA: Diagnosis not present

## 2016-08-08 DIAGNOSIS — I951 Orthostatic hypotension: Secondary | ICD-10-CM

## 2016-08-08 DIAGNOSIS — G620 Drug-induced polyneuropathy: Secondary | ICD-10-CM | POA: Diagnosis not present

## 2016-08-08 DIAGNOSIS — I878 Other specified disorders of veins: Secondary | ICD-10-CM

## 2016-08-08 DIAGNOSIS — C541 Malignant neoplasm of endometrium: Secondary | ICD-10-CM | POA: Diagnosis not present

## 2016-08-08 DIAGNOSIS — T451X5A Adverse effect of antineoplastic and immunosuppressive drugs, initial encounter: Secondary | ICD-10-CM

## 2016-08-08 LAB — COMPREHENSIVE METABOLIC PANEL
ALBUMIN: 3 g/dL — AB (ref 3.5–5.0)
ALK PHOS: 74 U/L (ref 40–150)
ALT: 6 U/L (ref 0–55)
ANION GAP: 8 meq/L (ref 3–11)
AST: 16 U/L (ref 5–34)
BUN: 10.4 mg/dL (ref 7.0–26.0)
CALCIUM: 8.8 mg/dL (ref 8.4–10.4)
CHLORIDE: 106 meq/L (ref 98–109)
CO2: 26 mEq/L (ref 22–29)
Creatinine: 0.8 mg/dL (ref 0.6–1.1)
EGFR: 74 mL/min/{1.73_m2} — ABNORMAL LOW (ref >90)
Glucose: 84 mg/dl (ref 70–140)
POTASSIUM: 4.4 meq/L (ref 3.5–5.1)
Sodium: 140 mEq/L (ref 136–145)
Total Bilirubin: 0.29 mg/dL (ref 0.20–1.20)
Total Protein: 6.2 g/dL — ABNORMAL LOW (ref 6.4–8.3)

## 2016-08-08 LAB — CBC WITH DIFFERENTIAL/PLATELET
BASO%: 0.2 % (ref 0.0–2.0)
BASOS ABS: 0 10*3/uL (ref 0.0–0.1)
EOS ABS: 0 10*3/uL (ref 0.0–0.5)
EOS%: 0.7 % (ref 0.0–7.0)
HEMATOCRIT: 31.5 % — AB (ref 34.8–46.6)
HEMOGLOBIN: 10.4 g/dL — AB (ref 11.6–15.9)
LYMPH#: 1.4 10*3/uL (ref 0.9–3.3)
LYMPH%: 33.7 % (ref 14.0–49.7)
MCH: 29.5 pg (ref 25.1–34.0)
MCHC: 33 g/dL (ref 31.5–36.0)
MCV: 89.5 fL (ref 79.5–101.0)
MONO#: 0.4 10*3/uL (ref 0.1–0.9)
MONO%: 9.1 % (ref 0.0–14.0)
NEUT#: 2.3 10*3/uL (ref 1.5–6.5)
NEUT%: 56.3 % (ref 38.4–76.8)
PLATELETS: 185 10*3/uL (ref 145–400)
RBC: 3.52 10*6/uL — ABNORMAL LOW (ref 3.70–5.45)
RDW: 18 % — AB (ref 11.2–14.5)
WBC: 4.1 10*3/uL (ref 3.9–10.3)

## 2016-08-08 MED ORDER — LIDOCAINE-PRILOCAINE 2.5-2.5 % EX CREA
TOPICAL_CREAM | CUTANEOUS | 1 refills | Status: DC
Start: 2016-08-08 — End: 2016-09-30

## 2016-08-08 NOTE — Telephone Encounter (Signed)
GAVE PATIENT AVS REPORT AND APPOINTMENTS FOR November AND December  °

## 2016-08-08 NOTE — Progress Notes (Signed)
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 She had cycle 3 carbo taxol on 07-24-16. She needs granix x3 after chemo, documented neutropenia with cycle 1.  Course has been complicated by at least 5 syncopal episodes, due to or exacerbated by orthostatic hypotension, dehydration, possibly vasovagal, with 2 ED evaluations. I have discussed directly with her PCP Dr Tomi Bamberger, see note 08-05-16. Patient is now pushing sports drinks and wearing thigh high support stockings fit by [facility]. She has BP cuff with HR monitor, RN has checked calibration and done teaching now.  Patient  is feeling well today, without weakness or orthostatic symptoms. Appetite is improved, bowels are moving, no abdominal or pelvic pain,  no bleeding, no SOB or chest discomfort, no palpitations, no LE swelling, no fever. She has urinary frequency and urgency, not clear if this is just from increased po fluids.  Peripheral IV access difficult and very stressful for patient, to the point that she refuses  IVF at this office and at ED. Minimal peripheral neuropathy in fingertips and toes not interfering with function, neurontin does not seem helpful Remainder of 10 point Review of Systems negative.    No central catheter Flu vaccine 06-23-16 No genetics testing  ONCOLOGIC HISTORY Patient presented to PCP with new vaginal discharge and some vaginal spotting, referred to Dr Dory Horn, whom she had known previously. D&C on 04-09-16 had high grade carcinoma favoring serous histology [phone]). She was referred to gyn oncology, saw Dr Denman George on 04-23-16. CT CAP 04-30-16 no evidence of metastatic disease, no pelvic  adenopathy, no ascites, thickened endometrial canal. On 05-06-16 Dr Alycia Rossetti performed robotic hysterectomy BSO bilateral pelvic and paraaortic node evaluation (failed sentinel LN mapping); patient was DC home on 05-07-16. 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 Post operative course was remarkable for acute onset left groin and thigh pain ~ 6 days post op, with repeat CT 05-16-16 without findings specific for those complaints; she was treated with neurontin and percocet, tapering now as symptoms have progressively improved. First carboplatin taxol given 06-11-16, neutropenic with ANC 0.9 on day 9 cycle 1, granix added. She had several syncopal episodes around cycle 3, ED evaluations then.   Objective:  Vital signs in last 24 hours:  BP (!) 162/75 (BP Location: Left Arm, Patient Position: Sitting)   Pulse 87   Temp 97.4 F (36.3 C) (Oral)   Resp 18   Ht 5' 6" (1.676 m)   Wt 176 lb 12.8 oz (80.2 kg)   SpO2 98%   BMI 28.54 kg/m  Weight up 3 lbs Alert, oriented and appropriate. Ambulatory without difficulty. Respirations not labored. Looks comfortable. Alopecia  HEENT:PERRL, sclerae not icteric. Oral mucosa moist without lesions, posterior pharynx clear.  Neck supple. No JVD.  Lymphatics:no cervical,supraclavicular, axillary or inguinal adenopathy Resp: clear to auscultation bilaterally and normal percussion bilaterally Cardio: regular rate and rhythm. No gallop. GI: soft, nontender, not distended, no mass or organomegaly. Normally active bowel sounds. Surgical incisions not remarkable. Musculoskeletal/ Extremities: LE without pitting edema, cords, tenderness Neuro: minimal peripheral neuropathy fingertips. No other focal  neurologic deficits, including speech fluent, no CN/ motor, cerebellar findings PSYCH tearful as she  relates difficulty with peripheral IV access. Skin without rash, ecchymosis, petechiae Portacath-without erythema or tenderness  Lab Results:  Results for orders placed or performed in visit on 08/08/16  CBC with Differential  Result Value Ref Range   WBC 4.1 3.9 - 10.3 10e3/uL   NEUT# 2.3 1.5 - 6.5 10e3/uL   HGB 10.4 (L) 11.6 - 15.9 g/dL   HCT 31.5 (L) 34.8 - 46.6 %   Platelets 185 145 - 400 10e3/uL   MCV 89.5 79.5 - 101.0 fL   MCH 29.5 25.1 - 34.0 pg   MCHC 33.0 31.5 - 36.0 g/dL   RBC 3.52 (L) 3.70 - 5.45 10e6/uL   RDW 18.0 (H) 11.2 - 14.5 %   lymph# 1.4 0.9 - 3.3 10e3/uL   MONO# 0.4 0.1 - 0.9 10e3/uL   Eosinophils Absolute 0.0 0.0 - 0.5 10e3/uL   Basophils Absolute 0.0 0.0 - 0.1 10e3/uL   NEUT% 56.3 38.4 - 76.8 %   LYMPH% 33.7 14.0 - 49.7 %   MONO% 9.1 0.0 - 14.0 %   EOS% 0.7 0.0 - 7.0 %   BASO% 0.2 0.0 - 2.0 %  Comprehensive metabolic panel  Result Value Ref Range   Sodium 140 136 - 145 mEq/L   Potassium 4.4 3.5 - 5.1 mEq/L   Chloride 106 98 - 109 mEq/L   CO2 26 22 - 29 mEq/L   Glucose 84 70 - 140 mg/dl   BUN 10.4 7.0 - 26.0 mg/dL   Creatinine 0.8 0.6 - 1.1 mg/dL   Total Bilirubin 0.29 0.20 - 1.20 mg/dL   Alkaline Phosphatase 74 40 - 150 U/L   AST 16 5 - 34 U/L   ALT 6 0 - 55 U/L   Total Protein 6.2 (L) 6.4 - 8.3 g/dL   Albumin 3.0 (L) 3.5 - 5.0 g/dL   Calcium 8.8 8.4 - 10.4 mg/dL   Anion Gap 8 3 - 11 mEq/L   EGFR 74 (L) >90 ml/min/1.73 m2   UA/ culture available after visit no UTI, will let patient know  Studies/Results:  No results found.  Medications: I have reviewed the patient's current medications. As she cannot tell any benefit in neuropathy symptoms from regular neurontin, and with rest of complicated situation, I have told her to stop the neurontin. EMLA She does not need decadron oral premed for cycle 4  DISCUSSION Have reviewed concerns re orthostatic hypotension and syncopal episodes. She is pushing [employer] sports drink, wearing thigh high  support stockings and following BP/ HR. I have told husband that systolic <196 or HR >222 should be treated with sitting or lying, immediately drinking sports drink and contacting office.   Discussed PAC vs PICC, as peripheral IV access has been difficult and patient is very stressed by this situation. PAC will require less care from her standpoint and still allow water aerobics, so have set up with IR for 08-11-16 prior to cycle 4 chemo.08-14-16. Patient and husband understand that there is some risk of infection and other problems with central lines, that this will need to be flushed every 6-8 weeks when not otherwise used, and can be removed if she is doing well out from completion of treatment. Patient is in agreement with PAC placement. Discussed rationale for additional IVF after chemo, given orthostatic hypotension/ syncopal episodes previously. PAC will make this easier to administer.  Will hold taxol cycle 4 on 11-16. Hopefully will be  able to add this back with other precautions with cycles and 6. Patient and husband understand and are in agreement with this plan.   Assessment/Plan:  1.Several syncopal episodes at home and at [facility] since cycle 3 chemo on 07-24-16. Possibly dehydration and vasovagal, but has been quite problematic. Two  ED evaluations this week without other findings, and PCP has seen.  Home BP monitoring and Teds/ oral hydration as above.  Will hold taxol cycle 4 and add IVF x2  after chemo. 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 Adjuvant carboplatin taxol begun 06-11-16, neutropenic day 9. Vaginal brachytherapy comcomitant withchemotherapy begun by Dr Sondra Come. Cycle 4 to be carboplatin only as above, with IVF and granix. I will see her back 08-18-16. 3. Difficult peripheral IV access to the point that she is refusing additional IVF. PAC by IR set up by MD for 08-11-16.[name] 4.neuropathic pain left  inguinal area postoperatively, no clear etiology on repeat CT AP then. Improved. Slight taxol peripheral neuropathy not better on neurontin, which she will stop. 5.hypothyroid on replacement by PCP 6.post cholecystectomy and remote BTL, past GERD 7.Up to date mammograms and colonoscopy 8.Atherosclerotic thoracic and abdominal aorta by CT 9.Degenerative arthritis spine 10.intentional weight loss in past year, ~ 40 lbs, weight up with better hydration today. 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   All questions answered and patient/ husband know to call if concerns prior to scheduled appointments; they are in agreement with all recommendations and plans above. Chemo orders adjusted, IVF orders and granix ordered.  Time spent 40 min including >50% counseling and coordination of care.  CC Dr Tomi Bamberger and update gyn onc.     Evlyn Clines, MD   08/08/2016, 8:16 PM

## 2016-08-10 ENCOUNTER — Other Ambulatory Visit: Payer: Self-pay | Admitting: Oncology

## 2016-08-10 DIAGNOSIS — R55 Syncope and collapse: Secondary | ICD-10-CM | POA: Insufficient documentation

## 2016-08-11 ENCOUNTER — Ambulatory Visit (HOSPITAL_COMMUNITY)
Admission: RE | Admit: 2016-08-11 | Discharge: 2016-08-11 | Disposition: A | Discharge status: Home / Self Care | Payer: Medicare Other | Source: Ambulatory Visit | Attending: Interventional Radiology | Admitting: Interventional Radiology

## 2016-08-11 ENCOUNTER — Other Ambulatory Visit: Payer: Self-pay | Admitting: Oncology

## 2016-08-11 ENCOUNTER — Ambulatory Visit (HOSPITAL_COMMUNITY)
Admission: RE | Admit: 2016-08-11 | Discharge: 2016-08-11 | Disposition: A | Discharge status: Home / Self Care | Payer: Medicare Other | Source: Ambulatory Visit | Attending: Oncology | Admitting: Oncology

## 2016-08-11 ENCOUNTER — Encounter (HOSPITAL_COMMUNITY): Payer: Self-pay

## 2016-08-11 DIAGNOSIS — Z9079 Acquired absence of other genital organ(s): Secondary | ICD-10-CM | POA: Insufficient documentation

## 2016-08-11 DIAGNOSIS — C541 Malignant neoplasm of endometrium: Secondary | ICD-10-CM

## 2016-08-11 DIAGNOSIS — Z90710 Acquired absence of both cervix and uterus: Secondary | ICD-10-CM | POA: Diagnosis not present

## 2016-08-11 DIAGNOSIS — K219 Gastro-esophageal reflux disease without esophagitis: Secondary | ICD-10-CM | POA: Insufficient documentation

## 2016-08-11 DIAGNOSIS — M1712 Unilateral primary osteoarthritis, left knee: Secondary | ICD-10-CM | POA: Diagnosis not present

## 2016-08-11 DIAGNOSIS — Z79899 Other long term (current) drug therapy: Secondary | ICD-10-CM | POA: Diagnosis not present

## 2016-08-11 DIAGNOSIS — Z90722 Acquired absence of ovaries, bilateral: Secondary | ICD-10-CM | POA: Insufficient documentation

## 2016-08-11 DIAGNOSIS — E039 Hypothyroidism, unspecified: Secondary | ICD-10-CM | POA: Diagnosis not present

## 2016-08-11 HISTORY — PX: IR GENERIC HISTORICAL: IMG1180011

## 2016-08-11 LAB — BASIC METABOLIC PANEL
Anion gap: 10 (ref 5–15)
BUN: 14 mg/dL (ref 6–20)
CALCIUM: 8.9 mg/dL (ref 8.9–10.3)
CHLORIDE: 106 mmol/L (ref 101–111)
CO2: 23 mmol/L (ref 22–32)
CREATININE: 0.74 mg/dL (ref 0.44–1.00)
GFR calc non Af Amer: >60 mL/min (ref >60)
Glucose, Bld: 93 mg/dL (ref 65–99)
Potassium: 4.1 mmol/L (ref 3.5–5.1)
Sodium: 139 mmol/L (ref 135–145)

## 2016-08-11 LAB — CBC WITH DIFFERENTIAL/PLATELET
BASOS PCT: 1 %
Basophils Absolute: 0 10*3/uL (ref 0.0–0.1)
EOS ABS: 0 10*3/uL (ref 0.0–0.7)
EOS PCT: 0 %
HCT: 32.2 % — ABNORMAL LOW (ref 36.0–46.0)
HEMOGLOBIN: 10.9 g/dL — AB (ref 12.0–15.0)
LYMPHS ABS: 1.6 10*3/uL (ref 0.7–4.0)
Lymphocytes Relative: 39 %
MCH: 30.2 pg (ref 26.0–34.0)
MCHC: 33.9 g/dL (ref 30.0–36.0)
MCV: 89.2 fL (ref 78.0–100.0)
MONO ABS: 0.2 10*3/uL (ref 0.1–1.0)
MONOS PCT: 6 %
NEUTROS PCT: 54 %
Neutro Abs: 2.3 10*3/uL (ref 1.7–7.7)
PLATELETS: 230 10*3/uL (ref 150–400)
RBC: 3.61 MIL/uL — ABNORMAL LOW (ref 3.87–5.11)
RDW: 18 % — AB (ref 11.5–15.5)
WBC: 4.2 10*3/uL (ref 4.0–10.5)

## 2016-08-11 LAB — PROTIME-INR
INR: 0.94
PROTHROMBIN TIME: 12.6 s (ref 11.4–15.2)

## 2016-08-11 MED ORDER — HEPARIN SOD (PORK) LOCK FLUSH 100 UNIT/ML IV SOLN
INTRAVENOUS | Status: AC
  Filled 2016-08-11: qty 5

## 2016-08-11 MED ORDER — FENTANYL CITRATE (PF) 100 MCG/2ML IJ SOLN
INTRAMUSCULAR | Status: AC
  Filled 2016-08-11: qty 4

## 2016-08-11 MED ORDER — LIDOCAINE HCL 1 % IJ SOLN
INTRAMUSCULAR | Status: AC | PRN
  Administered 2016-08-11: 5 mL via INTRADERMAL

## 2016-08-11 MED ORDER — LIDOCAINE-EPINEPHRINE (PF) 2 %-1:200000 IJ SOLN
INTRAMUSCULAR | Status: AC
  Filled 2016-08-11: qty 20

## 2016-08-11 MED ORDER — SODIUM CHLORIDE 0.9 % IV SOLN
INTRAVENOUS | Status: DC
  Administered 2016-08-11: 13:00:00 via INTRAVENOUS

## 2016-08-11 MED ORDER — LIDOCAINE HCL 1 % IJ SOLN
INTRAMUSCULAR | Status: AC
  Filled 2016-08-11: qty 20

## 2016-08-11 MED ORDER — FENTANYL CITRATE (PF) 100 MCG/2ML IJ SOLN
INTRAMUSCULAR | Status: AC | PRN
  Administered 2016-08-11: 25 ug via INTRAVENOUS
  Administered 2016-08-11: 50 ug via INTRAVENOUS

## 2016-08-11 MED ORDER — MIDAZOLAM HCL 2 MG/2ML IJ SOLN
INTRAMUSCULAR | Status: AC | PRN
  Administered 2016-08-11 (×4): 1 mg via INTRAVENOUS

## 2016-08-11 MED ORDER — MIDAZOLAM HCL 2 MG/2ML IJ SOLN
INTRAMUSCULAR | Status: AC
  Filled 2016-08-11: qty 6

## 2016-08-11 MED ORDER — CEFAZOLIN SODIUM-DEXTROSE 2-4 GM/100ML-% IV SOLN
2.0000 g | INTRAVENOUS | Status: AC
  Administered 2016-08-11: 2 g via INTRAVENOUS
  Filled 2016-08-11: qty 100

## 2016-08-11 MED ORDER — HEPARIN SOD (PORK) LOCK FLUSH 100 UNIT/ML IV SOLN
INTRAVENOUS | Status: AC | PRN
  Administered 2016-08-11: 500 [IU] via INTRAVENOUS

## 2016-08-11 NOTE — Discharge Instructions (Signed)
Implanted Port Insertion, Care After °Refer to this sheet in the next few weeks. These instructions provide you with information on caring for yourself after your procedure. Your health care provider may also give you more specific instructions. Your treatment has been planned according to current medical practices, but problems sometimes occur. Call your health care provider if you have any problems or questions after your procedure. °WHAT TO EXPECT AFTER THE PROCEDURE °After your procedure, it is typical to have the following:  °· Discomfort at the port insertion site. Ice packs to the area will help. °· Bruising on the skin over the port. This will subside in 3-4 days. °HOME CARE INSTRUCTIONS °· After your port is placed, you will get a manufacturer's information card. The card has information about your port. Keep this card with you at all times.   °· Know what kind of port you have. There are many types of ports available.   °· Wear a medical alert bracelet in case of an emergency. This can help alert health care workers that you have a port.   °· The port can stay in for as long as your health care provider believes it is necessary.   °· A home health care nurse may give medicines and take care of the port.   °· You or a family member can get special training and directions for giving medicine and taking care of the port at home.   °SEEK MEDICAL CARE IF:  °· Your port does not flush or you are unable to get a blood return.   °· You have a fever or chills. °SEEK IMMEDIATE MEDICAL CARE IF: °· You have new fluid or pus coming from your incision.   °· You notice a bad smell coming from your incision site.   °· You have swelling, pain, or more redness at the incision or port site.   °· You have chest pain or shortness of breath. °  °This information is not intended to replace advice given to you by your health care provider. Make sure you discuss any questions you have with your health care provider. °  °Document  Released: 07/06/2013 Document Revised: 09/20/2013 Document Reviewed: 07/06/2013 °Elsevier Interactive Patient Education ©2016 [employer] °Moderate Conscious Sedation, Adult, Care After °Refer to this sheet in the next few weeks. These instructions provide you with information on caring for yourself after your procedure. Your health care provider may also give you more specific instructions. Your treatment has been planned according to current medical practices, but problems sometimes occur. Call your health care provider if you have any problems or questions after your procedure. °WHAT TO EXPECT AFTER THE PROCEDURE  °After your procedure: °· You may feel sleepy, clumsy, and have poor balance for several hours. °· Vomiting may occur if you eat too soon after the procedure. °HOME CARE INSTRUCTIONS °· Do not participate in any activities where you could become injured for at least 24 hours. Do not: °¨ Drive. °¨ Swim. °¨ Ride a bicycle. °¨ Operate heavy machinery. °¨ Cook. °¨ Use power tools. °¨ Climb ladders. °¨ Work from a high place. °· Do not make important decisions or sign legal documents until you are improved. °· If you vomit, drink water, juice, or soup when you can drink without vomiting. Make sure you have little or no nausea before eating solid foods. °· Only take over-the-counter or prescription medicines for pain, discomfort, or fever as directed by your health care provider. °· Make sure you and your family fully understand everything about the medicines given   to you, including what side effects may occur. °· You should not drink alcohol, take sleeping pills, or take medicines that cause drowsiness for at least 24 hours. °· If you smoke, do not smoke without supervision. °· If you are feeling better, you may resume normal activities 24 hours after you were sedated. °· Keep all appointments with your health care provider. °SEEK MEDICAL CARE IF: °· Your skin is pale or bluish in color. °· You continue to  feel nauseous or vomit. °· Your pain is getting worse and is not helped by medicine. °· You have bleeding or swelling. °· You are still sleepy or feeling clumsy after 24 hours. °SEEK IMMEDIATE MEDICAL CARE IF: °· You develop a rash. °· You have difficulty breathing. °· You develop any type of allergic problem. °· You have a fever. °MAKE SURE YOU: °· Understand these instructions. °· Will watch your condition. °· Will get help right away if you are not doing well or get worse. °  °This information is not intended to replace advice given to you by your health care provider. Make sure you discuss any questions you have with your health care provider. °  °Document Released: 07/06/2013 Document Revised: 10/06/2014 Document Reviewed: 07/06/2013 °Elsevier Interactive Patient Education ©2016 [employer] ° °

## 2016-08-11 NOTE — Consult Note (Signed)
Chief Complaint: Patient was seen in consultation today for Port-A-Cath placement  Referring Physician(s): Livesay,Lennis P  Supervising Physician: Daryll Brod  Patient Status: [facility]  History of Present Illness: Kayla Guerrero is a 81 y.o. 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 Chemotherapy has been initiated in addition to vaginal  brachytherapy but patient has difficult peripheral IV access and she presents today for Port-A-Cath placement for additional chemotherapy.  Past Medical History:  Diagnosis Date  . Arthritis   . Arthritis of knee, left   . Borderline osteopenia DEXA 2006 and 2012  . Cancer [facility])    endometrial  . Cataract   . Cholesterol serum elevated   . Colon polyps   . FHx: colon cancer   . GERD (gastroesophageal reflux disease)   . Hypothyroidism   . Obesity     Past Surgical History:  Procedure Laterality Date  . CATARACT EXTRACTION, BILATERAL    . CHOLECYSTECTOMY    . COLONOSCOPY  2010, 04/2013   Buccini  . PTERYGIUM EXCISION    . ROBOTIC ASSISTED TOTAL HYSTERECTOMY WITH BILATERAL SALPINGO OOPHERECTOMY N/A 05/06/2016   Procedure: XI ROBOTIC ASSISTED  LAPAROSCOPIC TOTAL HYSTERECTOMY WITH BILATERAL SALPINGO OOPHORECTOMY;  Surgeon: Nancy Marus, MD;  Location: [facility];  Service: Gynecology;  Laterality: N/A;  . SENTINEL NODE BIOPSY N/A 05/06/2016   Procedure: SENTINEL NODE BIOPSY;  Surgeon: Nancy Marus, MD;  Location: [facility];  Service: Gynecology;  Laterality: N/A;  . TUBAL LIGATION      Allergies: Levaquin [levofloxacin hemihydrate]; Promethazine hcl; and Sulfa drugs cross reactors  Medications: Prior to Admission medications   Medication Sig Start Date End Date Taking? Authorizing Provider  Cholecalciferol (VITAMIN D) 2000 UNITS tablet Take 2,000 Units by mouth daily.   Yes Historical Provider, MD  dexamethasone  (DECADRON) 4 MG tablet Take 5 tablets with food 12 hrs and 6 hrs prior to Taxol Chemotherapy. 07/31/16  Yes Lennis Marion Downer, MD  glucosamine-chondroitin 500-400 MG tablet Take 1 tablet by mouth 2 (two) times daily.    Yes Historical Provider, MD  levothyroxine (SYNTHROID, LEVOTHROID) 88 MCG tablet TAKE ONE TABLET BY MOUTH ONCE DAILY BEFORE  BREAKFAST Patient taking differently: TAKE 5mcg TABLET BY MOUTH ONCE DAILY BEFORE  BREAKFAST 07/10/16  Yes Rita Ohara, MD  loratadine (CLARITIN) 10 MG tablet Take 10 mg by mouth daily as needed for allergies.   Yes Historical Provider, MD  LORazepam (ATIVAN) 0.5 MG tablet Place 1 tablet under the tongue or swallow every 6 hrs as needed for nausea. Will make drowsy. 07/31/16  Yes Lennis Marion Downer, MD  Magnesium 400 MG CAPS Take 1 capsule by mouth daily.     Yes Historical Provider, MD  Multiple Vitamin (MULTI-VITAMIN) tablet Take 1 tablet by mouth daily.     Yes Historical Provider, MD  naproxen sodium (ANAPROX) 220 MG tablet Take 220 mg by mouth 2 (two) times daily as needed (pain).    Yes Historical Provider, MD  ondansetron (ZOFRAN) 8 MG tablet Take 1 tablet (8 mg total) by mouth every 8 (eight) hours as needed for nausea (Will not make drowsy.). 06/06/16  Yes Lennis Marion Downer, MD  polyethylene glycol (MIRALAX / GLYCOLAX) packet Take 17 g by mouth 2 (two) times daily as needed for mild constipation.    Yes Historical Provider, MD  Red Yeast Rice Extract (RED YEAST RICE PO) Take 1-2 tablets  by mouth 2 (two) times daily. Take 1 tablet in the morning and then take 2 tablets with dinner   Yes Historical Provider, MD  senna-docusate (SENOKOT S) 8.6-50 MG tablet Take 1 tablet by mouth 2 (two) times daily as needed for mild constipation.   Yes Historical Provider, MD  Wheat Dextrin (BENEFIBER DRINK MIX PO) Take 1 scoop by mouth 2 (two) times daily.    Yes Historical Provider, MD  lidocaine-prilocaine (EMLA) cream Apply to Porta-Cath 1-2 hours prior to access as directed.  08/08/16   Lennis Marion Downer, MD  oxyCODONE-acetaminophen (PERCOCET/ROXICET) 5-325 MG tablet Take 1-2 tablets by mouth every 4 (four) hours as needed (moderate to severe pain). Patient not taking: Reported on 08/11/2016 05/28/16   Nancy Marus, MD     Family History  Problem Relation Age of Onset  . Cancer Mother 19    COLON   . Heart disease Mother   . Cancer Maternal Aunt   . Breast cancer Maternal Aunt   . Mental illness Maternal Uncle   . Cancer Maternal Grandmother     stomach  . Macular degeneration Maternal Grandmother   . Diabetes Neg Hx     Social History   Social History  . Marital status: Married    Spouse name: N/A  . Number of children: 1  . Years of education: N/A   Occupational History  . retired Librarian, academic); Artist    Social History Main Topics  . Smoking status: Former Smoker    Types: Cigarettes    Quit date: 09/29/1980  . Smokeless tobacco: Never Used  . Alcohol use 0.0 oz/week     Comment: 1 glass of wine 3-4 times/week; hard liquor (highball) or a beer once a week  . Drug use: No  . Sexual activity: Not Asked   Other Topics Concern  . None   Social History Narrative   Lives with husband.  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 Son lives in [place]; granddaughter lives in [place] (with her Verlee Rossetti is divorced)      Review of Systems currently denies fever, headache, chest pain, dyspnea, cough, abdominal/back pain, nausea, vomiting or abnormal bleeding. She has had some peripheral neuropathy in the feet and fingertips as well as history of syncopal episodes at home.  Vital Signs: BP (!) 162/80 (BP Location: Right Arm)   Pulse 87   Temp 97.7 F (36.5 C) (Oral)   Resp 16   Wt 173 lb 6.4 oz (78.7 kg)   SpO2 100%   BMI 27.99 kg/m   Physical Exam patient awake, alert. Chest clear to auscultation bilaterally. Heart with regular rate  and rhythm. Abdomen soft, positive bowel sounds, nontender. Lower extremities with no edema.  Mallampati Score:     Imaging: Ct Angio Chest Pe W And/or Wo Contrast  Result Date: 07/29/2016 CLINICAL DATA:  Shortness of breath.  Endometrial carcinoma EXAM: CT ANGIOGRAPHY CHEST WITH CONTRAST TECHNIQUE: Multidetector CT imaging of the chest was performed using the standard protocol during bolus administration of intravenous contrast. Multiplanar CT image reconstructions and MIPs were obtained to evaluate the vascular anatomy. CONTRAST:  100 mL Isovue 370 nonionic COMPARISON:  Chest CT April 30, 2016 FINDINGS: Cardiovascular: There is no demonstrable pulmonary embolus. There is no thoracic aortic aneurysm or dissection. There is moderate atherosclerotic calcification throughout the aorta. There is calcification at the origins of the left common carotid and left  subclavian arteries. Visualized great vessels otherwise appear normal. There are foci of coronary artery calcification evident. The pericardium is not appreciably thickened. Mediastinum/Nodes: There is a nodular opacity in the right lobe of the thyroid measuring 1.2 x 0.8 cm. Thyroid otherwise appears unremarkable. There are scattered subcentimeter mediastinal lymph nodes, but there are no lymph nodes meeting size criteria for pathologic significance. There is a focal hiatal hernia. Lungs/Pleura: There is mild bibasilar atelectatic change. There is no parenchymal lung edema or consolidation. No mass or nodular opacity is evident. There is no pleural thickening or pleural effusion evident. Upper Abdomen: There is a 1 x 0.7 cm cyst in anterior segment of the right lobe of the liver peripherally. Gallbladder is absent. There is stable mild biliary duct dilatation without mass or calculus evident. There is atherosclerotic calcification in the upper abdominal aorta. Visualized upper abdominal structures otherwise appear unremarkable. Musculoskeletal: There is  degenerative change in the lower thoracic and upper lumbar spine regions. No blastic or lytic bone lesions are evident. Review of the MIP images confirms the above findings. IMPRESSION: No demonstrable pulmonary embolus. Multiple foci of atherosclerotic calcification in the aorta as well as foci of coronary artery calcification. No edema or consolidation. No lung mass or nodule lesion. No adenopathy. Gallbladder absent.  Stable mild biliary duct prominence. Stable nodular opacity right lobe of thyroid which does not meet consensus guidelines criteria for further assessment. Electronically Signed   By: Lowella Grip III M.D.   On: 07/29/2016 14:26   Mm Screening Breast Tomo Bilateral  Result Date: 07/17/2016 CLINICAL DATA:  Screening. EXAM: 2D DIGITAL SCREENING BILATERAL MAMMOGRAM WITH CAD AND ADJUNCT TOMO COMPARISON:  Previous exam(s). ACR Breast Density Category b: There are scattered areas of fibroglandular density. FINDINGS: There are no findings suspicious for malignancy. Images were processed with CAD. IMPRESSION: No mammographic evidence of malignancy. A result letter of this screening mammogram will be mailed directly to the patient. RECOMMENDATION: Screening mammogram in one year. (Code:SM-B-01Y) BI-RADS CATEGORY  1: Negative. Electronically Signed   By: Fidela Salisbury M.D.   On: 07/17/2016 16:09    Labs:  CBC:  Recent Labs  07/29/16 1213 07/31/16 1046 08/08/16 0814 08/11/16 1256  WBC 2.3* 3.9 4.1 4.2  HGB 10.4* 11.0* 10.4* 10.9*  HCT 30.8* 33.4* 31.5* 32.2*  PLT 251 221 185 230    COAGS:  Recent Labs  08/11/16 1256  INR 0.94    BMP:  Recent Labs  10/16/15 1516  05/01/16 1130 05/07/16 0455  07/24/16 0840 07/29/16 1213 07/31/16 1046 08/08/16 0814  NA 140  < > 140 137  < > 138 137 138 140  K 4.2  < > 4.5 4.7  < > 5.2* 3.9 4.6 4.4  CL 102  --  104 107  --   --  104  --   --   CO2 28  < > 29 27  < > 25 28 26 26   GLUCOSE 83  < > 93 120*  < > 185* 104* 95 84    BUN 14  < > 13 12  < > 14.9 19 14.2 10.4  CALCIUM 9.0  < > 8.9 8.0*  < > 9.6 8.5* 8.6 8.8  CREATININE 0.87  < > 0.93 0.90  < > 0.8 0.79 0.7 0.8  GFRNONAA  --   --  57* 59*  --   --  >60  --   --   GFRAA  --   --  >60 >60  --   --  >  60  --   --   < > = values in this interval not displayed.  LIVER FUNCTION TESTS:  Recent Labs  07/21/16 1344 07/24/16 0840 07/29/16 1213 08/08/16 0814  BILITOT <0.22 <0.22 0.8 0.29  AST 15 14 20 16   ALT 7 7 11* 6  ALKPHOS 63 70 47 74  PROT 6.5 7.0 6.0* 6.2*  ALBUMIN 3.0* 3.2* 3.3* 3.0*    TUMOR MARKERS: No results for input(s): AFPTM, CEA, CA199, CHROMGRNA in the last 8760 hours.  Assessment and Plan: 81 y.o. 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 Chemotherapy has been initiated in addition to vaginal  brachytherapy but patient has difficult peripheral IV access and she presents today for Port-A-Cath placement for additional chemotherapy.Risks and benefits discussed with the patient/husband including, but not limited to bleeding, infection, pneumothorax, or fibrin sheath development and need for additional procedures.All of the patient's questions were answered, patient is agreeable to proceed.Consent signed and in chart.     Thank you for this interesting consult.  I greatly enjoyed meeting PEARLIE DAS and look forward to participating in their care.  A copy of this report was sent to the requesting provider on this date.  Electronically Signed: D. Rowe Robert 08/11/2016, 1:46 PM   I spent a total of  20 minutes   in face to face in clinical consultation, greater than 50% of which was counseling/coordinating care for port a cath placement

## 2016-08-11 NOTE — Procedures (Signed)
Endometrial ca  S/p RT IJ POWER PORT  TIP SVCRA NO COMP READY FOR USE EBL 0 FULL REPORT IN PACS

## 2016-08-12 ENCOUNTER — Ambulatory Visit (INDEPENDENT_AMBULATORY_CARE_PROVIDER_SITE_OTHER): Payer: Medicare Other | Admitting: Podiatry

## 2016-08-12 ENCOUNTER — Telehealth: Payer: Self-pay | Admitting: *Deleted

## 2016-08-12 ENCOUNTER — Encounter: Payer: Self-pay | Admitting: Podiatry

## 2016-08-12 DIAGNOSIS — Z79899 Other long term (current) drug therapy: Secondary | ICD-10-CM

## 2016-08-12 DIAGNOSIS — L603 Nail dystrophy: Secondary | ICD-10-CM | POA: Diagnosis not present

## 2016-08-12 MED ORDER — TERBINAFINE HCL 250 MG PO TABS: 250.0000 mg | ORAL_TABLET | Freq: Every day | ORAL | 0 refills | Status: DC

## 2016-08-12 NOTE — Telephone Encounter (Signed)
CALLED PATIENT TO REMIND OF HDR TX. FOR 08-13-16, SPOKE WITH PATIENT AND SHE IS AWARE OF THIS [place].

## 2016-08-12 NOTE — Progress Notes (Signed)
She presents today for follow-up of her onychomycosis and Lamisil use. She states that she is doing much better with this. In the onychomycosis is nearly resolved.  Objective: Pulses remain palpable bilateral. She continues to have new nail growth. Her onychomycosis proximally 80-85% resolved.  Assessment: Resolving onychomycosis.  Plan: Recommended that she continue at least an every other day to every third day dose of Lamisil 250 mg tablets 1 by mouth daily to every third day. Follow up with me as needed

## 2016-08-13 ENCOUNTER — Telehealth: Payer: Self-pay

## 2016-08-13 ENCOUNTER — Ambulatory Visit
Admission: RE | Admit: 2016-08-13 | Discharge: 2016-08-13 | Disposition: A | Discharge status: Home / Self Care | Payer: Medicare Other | Source: Ambulatory Visit | Attending: Radiation Oncology | Admitting: Radiation Oncology

## 2016-08-13 DIAGNOSIS — C541 Malignant neoplasm of endometrium: Secondary | ICD-10-CM

## 2016-08-13 NOTE — Progress Notes (Signed)
  Radiation Oncology         [phone] ________________________________  Name: Kayla Guerrero MRN: [id number]  Date: 08/13/2016  DOB: 04/14/1935  CC: [name], MD  [name], MD  HDR BRACHYTHERAPY NOTE  DIAGNOSIS: Stage IB endometrioid and serous carcinoma of endometrium   Simple treatment device note: Patient had construction of her custom vaginal cylinder. She will be treated with a 3 cm diameter segmented cylinder. This conforms to her anatomy without undue discomfort.  Vaginal brachytherapy procedure node: The patient was brought to the [place] suite. Identity was confirmed. All relevant records and images related to the planned course of therapy were reviewed. The patient freely provided informed written consent to proceed with treatment after reviewing the details related to the planned course of therapy. The consent form was witnessed and verified by the simulation staff. Then, the patient was set-up in a stable reproducible supine position for radiation therapy. Pelvic exam revealed the cuff to be intact. The patient's custom vaginal cylinder was placed in the proximal vagina. This was affixed to the CT/MR stabilization plate to prevent slippage. Patient tolerated the placement well.  Verification simulation note:  A fiducial marker was placed within the vaginal cylinder. An AP and lateral film was then obtained through the pelvis area. This documented accurate position of the vaginal cylinder for treatment.  HDR BRACHYTHERAPY TREATMENT  The remote afterloading device was affixed to the vaginal cylinder by catheter. Patient then proceeded to undergo her second high-dose-rate treatment directed at the proximal vagina. The patient was prescribed a dose of 6 Gy gray to be delivered to the mucosal surface. Treatment length was 3 cm. Patient was treated with 1 channel using 7 dwell positions. Treatment time was 225.3 seconds. Iridium 192 was the high-dose-rate source for  treatment. The patient tolerated the treatment well. After completion of her therapy, a radiation survey was performed documenting return of the iridium source into the [employer] safe.   PLAN: She will return next week for her third high-dose-rate treatment.  ________________________________  Blair Promise, PhD, MD   This document serves as a record of services personally performed by Gery Pray, MD. It was created on his behalf by Bethann Humble, a trained medical scribe. The creation of this record is based on the scribe's personal observations and the provider's statements to them. This document has been checked and approved by the attending provider.

## 2016-08-13 NOTE — Telephone Encounter (Signed)
Confirmed with Ms Gorgas that she is not to take decadron premedications for treatment 08-14-16 as she is not receiving Taxol that day per Dr. Mariana Kaufman office note 08-09-15. Reviewed when and how to apply EMLA cream prior to treatment tomorrow.

## 2016-08-14 ENCOUNTER — Other Ambulatory Visit: Payer: Self-pay | Admitting: Oncology

## 2016-08-14 ENCOUNTER — Encounter: Payer: Self-pay | Admitting: Oncology

## 2016-08-14 ENCOUNTER — Other Ambulatory Visit (HOSPITAL_BASED_OUTPATIENT_CLINIC_OR_DEPARTMENT_OTHER): Payer: Medicare Other

## 2016-08-14 ENCOUNTER — Ambulatory Visit: Payer: Medicare Other | Admitting: Podiatry

## 2016-08-14 ENCOUNTER — Ambulatory Visit: Payer: Medicare Other

## 2016-08-14 ENCOUNTER — Ambulatory Visit (HOSPITAL_BASED_OUTPATIENT_CLINIC_OR_DEPARTMENT_OTHER): Payer: Medicare Other

## 2016-08-14 VITALS — BP 175/87 | HR 85 | Temp 98.0°F | Resp 17

## 2016-08-14 DIAGNOSIS — C541 Malignant neoplasm of endometrium: Secondary | ICD-10-CM | POA: Diagnosis not present

## 2016-08-14 DIAGNOSIS — Z95828 Presence of other vascular implants and grafts: Secondary | ICD-10-CM

## 2016-08-14 LAB — CBC WITH DIFFERENTIAL/PLATELET
BASO%: 1.5 % (ref 0.0–2.0)
BASOS ABS: 0 10*3/uL (ref 0.0–0.1)
EOS%: 0.8 % (ref 0.0–7.0)
Eosinophils Absolute: 0 10*3/uL (ref 0.0–0.5)
HCT: 31.6 % — ABNORMAL LOW (ref 34.8–46.6)
HGB: 10.3 g/dL — ABNORMAL LOW (ref 11.6–15.9)
LYMPH%: 36.2 % (ref 14.0–49.7)
MCH: 29.5 pg (ref 25.1–34.0)
MCHC: 32.5 g/dL (ref 31.5–36.0)
MCV: 90.9 fL (ref 79.5–101.0)
MONO#: 0.3 10*3/uL (ref 0.1–0.9)
MONO%: 10.5 % (ref 0.0–14.0)
NEUT#: 1.3 10*3/uL — ABNORMAL LOW (ref 1.5–6.5)
NEUT%: 51 % (ref 38.4–76.8)
Platelets: 249 10*3/uL (ref 145–400)
RBC: 3.48 10*6/uL — AB (ref 3.70–5.45)
RDW: 19.2 % — AB (ref 11.2–14.5)
WBC: 2.6 10*3/uL — ABNORMAL LOW (ref 3.9–10.3)
lymph#: 0.9 10*3/uL (ref 0.9–3.3)

## 2016-08-14 LAB — COMPREHENSIVE METABOLIC PANEL
ALT: 7 U/L (ref 0–55)
AST: 16 U/L (ref 5–34)
Albumin: 3 g/dL — ABNORMAL LOW (ref 3.5–5.0)
Alkaline Phosphatase: 68 U/L (ref 40–150)
Anion Gap: 9 mEq/L (ref 3–11)
BUN: 13.8 mg/dL (ref 7.0–26.0)
CHLORIDE: 106 meq/L (ref 98–109)
CO2: 26 mEq/L (ref 22–29)
Calcium: 8.9 mg/dL (ref 8.4–10.4)
Creatinine: 0.8 mg/dL (ref 0.6–1.1)
EGFR: 75 mL/min/{1.73_m2} — ABNORMAL LOW (ref >90)
GLUCOSE: 89 mg/dL (ref 70–140)
POTASSIUM: 4.1 meq/L (ref 3.5–5.1)
SODIUM: 141 meq/L (ref 136–145)
Total Bilirubin: 0.32 mg/dL (ref 0.20–1.20)
Total Protein: 6.3 g/dL — ABNORMAL LOW (ref 6.4–8.3)

## 2016-08-14 MED ORDER — HEPARIN SOD (PORK) LOCK FLUSH 100 UNIT/ML IV SOLN
500.0000 [IU] | Freq: Once | INTRAVENOUS | Status: AC
  Administered 2016-08-14: 500 [IU] via INTRAVENOUS
  Filled 2016-08-14: qty 5

## 2016-08-14 MED ORDER — TBO-FILGRASTIM 300 MCG/0.5ML ~~LOC~~ SOSY
300.0000 ug | PREFILLED_SYRINGE | Freq: Once | SUBCUTANEOUS | Status: AC
  Administered 2016-08-14: 300 ug via SUBCUTANEOUS
  Filled 2016-08-14: qty 0.5

## 2016-08-14 MED ORDER — SODIUM CHLORIDE 0.9% FLUSH
10.0000 mL | INTRAVENOUS | Status: DC | PRN
  Administered 2016-08-14: 10 mL via INTRAVENOUS
  Filled 2016-08-14: qty 10

## 2016-08-14 MED ORDER — SODIUM CHLORIDE 0.9% FLUSH
10.0000 mL | Freq: Once | INTRAVENOUS | Status: AC
  Administered 2016-08-14: 10 mL via INTRAVENOUS
  Filled 2016-08-14: qty 10

## 2016-08-14 NOTE — Progress Notes (Unsigned)
Medical Oncology  ANC 1.3 today,  too low for cycle 4 chemo, which was planned as carboplatin only today. Patient to have granix today. Cancel IVF and injections 11-18, 11-20 Cancel LL 11-20 WIll treat with cycle 4 carbo only on 11-24 as long as Guadalupe Guerra >=1.5 and plt >=100k. Will give IVF on 11-25 + granix, and granix on 11-27. Will not give IVF on 11-27 unless concerns.  She will see MD with granix 12-1   2 scheduling messages: cancel chemo today, give granix today  Cancel IVF on 11-18 and 11-20  cancel LL lab injection 11-20  carbo 11-24  IVF 11-25 + injection  LL + lab from [facility] 12-1 0830 or 1030  AND Separate scheduling message sent to add granix 11-27 and to MD on 12-1  L.Marko Plume, MD

## 2016-08-14 NOTE — Patient Instructions (Signed)
Neutropenia Introduction Neutropenia is a condition that occurs when you have a lower-than-normal level of a type of white blood cell (neutrophil) in your body. Neutrophils are made in the spongy center of large bones (bone marrow) and they fight infections. Neutrophils are your body's main defense against bacterial and fungal infections. The fewer neutrophils you have and the longer your body remains without them, the greater your risk of getting a severe infection. What are the causes? This condition can occur if your body uses up or destroys neutrophils faster than your bone marrow can make them. This problem may happen because of:  Bacterial or fungal infection.  Allergic disorders.  Reactions to some medicines.  Autoimmune disease.  An enlarged spleen. This condition can also occur if your bone marrow does not produce enough neutrophils. This problem may be caused by:  Cancer.  Cancer treatments, such as radiation or chemotherapy.  Viral infections.  Medicines, such as phenytoin.  Vitamin B12 deficiency.  Diseases of the bone marrow.  Environmental toxins, such as insecticides. What are the signs or symptoms? This condition does not usually cause symptoms. If symptoms are present, they are usually caused by an underlying infection. Symptoms of an infection may include:  Fever.  Chills.  Swollen glands.  Oral or anal ulcers.  Cough and shortness of breath.  Rash.  Skin infection.  Fatigue. How is this diagnosed? Your health care provider may suspect neutropenia if you have:  A condition that may cause neutropenia.  Symptoms of infection, especially fever.  Frequent and unusual infections. You will have a medical history and physical exam. Tests will also be done, such as:  A complete blood count (CBC).  A procedure to collect a sample of bone marrow for examination (bone marrow biopsy).  A chest X-ray.  A urine culture.  A blood culture. How is  this treated? Treatment depends on the underlying cause and severity of your condition. Mild neutropenia may not require treatment. Treatment may include medicines, such as:  Antibiotic medicine given through an IV tube.  Antiviral medicines.  Antifungal medicines.  A medicine to increase neutrophil production (colony-stimulating factor). You may get this drug through an IV tube or by injection.  Steroids given through an IV tube. If an underlying condition is causing neutropenia, you may need treatment for that condition. If medicines you are taking are causing neutropenia, your health care provider may have you stop taking those medicines. Follow these instructions at home: Medicines  Take over-the-counter and prescription medicines only as told by your health care provider.  Get a seasonal flu shot (influenza vaccine). Lifestyle  Do not eat unpasteurized foods.Do not eat unwashed raw fruits or vegetables.  Avoid exposure to groups of people or children.  Avoid being around people who are sick.  Avoid being around dirt or dust, such as in construction areas or gardens.  Do not provide direct care for pets. Avoid animal droppings. Do not clean litter boxes and bird cages. Hygiene   Bathe daily.  Clean the area between the genitals and the anus (perineal area) after you urinate or have a bowel movement. If you are female, wipe from front to back.  Brush your teeth with a soft toothbrush before and after meals.  Do not use a razor that has a blade. Use an electric razor to remove hair.  Wash your hands often. Make sure others who come in contact with you also wash their hands. If soap and water are not available, use hand   sanitizer. General instructions  Do not have sex unless your health care provider has approved.  Take actions to avoid cuts and burns. For example:  Be cautious when you use knives. Always cut away from yourself.  Keep knives in protective sheaths or  guards when not in use.  Use oven mitts when you cook with a hot stove, oven, or grill.  Stand a safe distance away from open fires.  Avoid people who received a vaccine in the past 30 days if that vaccine contained a live version of the germ (live vaccine). You should not get a live vaccine. Common live vaccines are varicella, measles, mumps, and rubella.  Do not share food utensils.  Do not use tampons, enemas, or rectal suppositories unless your health care provider has approved.  Keep all appointments as told by your health care provider. This is important. Contact a health care provider if:  You have a fever.  You have chills or you start to shake.  You have:  A sore throat.  A warm, red, or tender area on your skin.  A cough.  Frequent or painful urination.  Vaginal discharge or itching.  You develop:  Sores in your mouth or anus.  Swollen lymph nodes.  Red streaks on the skin.  A rash.  You feel:  Nauseous or you vomit.  Very fatigued.  Short of breath. This information is not intended to replace advice given to you by your health care provider. Make sure you discuss any questions you have with your health care provider. Document Released: 03/07/2002 Document Revised: 02/21/2016 Document Reviewed: 03/28/2015  2017 Elsevier

## 2016-08-15 ENCOUNTER — Encounter: Payer: Self-pay | Admitting: General Practice

## 2016-08-15 NOTE — Progress Notes (Signed)
[facility] Spiritual Care Note  Referred by Dr Marko Plume for support as pt and husband navigate emotional distress from tx.  Sent handwritten note of support with brochures describing both spiritual care and counseling resources available at [facility], encouraging pt to call to connect.   [place], [place], [facility] Pager [phone] Voicemail [phone]

## 2016-08-16 ENCOUNTER — Ambulatory Visit: Payer: Medicare Other

## 2016-08-18 ENCOUNTER — Ambulatory Visit: Payer: Medicare Other

## 2016-08-18 ENCOUNTER — Ambulatory Visit: Payer: Medicare Other | Admitting: Oncology

## 2016-08-18 ENCOUNTER — Telehealth: Payer: Self-pay

## 2016-08-18 ENCOUNTER — Other Ambulatory Visit: Payer: Medicare Other

## 2016-08-18 NOTE — Telephone Encounter (Signed)
Reviewed patient appointment schedule as updated this evening. Ms Kayla Guerrero noted that she woke up about 4 am the last 2 mornings with achiness in hips to her feet.  It gradually improves by the afternoon.  She has not taken any aleve as this constipates her. Suggested she try aleve 2 tabs tonight at HS with food.  Increase miralax and senokot if needed for bowels. Ms Kayla Guerrero verbalized understanding.

## 2016-08-19 ENCOUNTER — Ambulatory Visit: Payer: Medicare Other

## 2016-08-19 ENCOUNTER — Telehealth: Payer: Self-pay | Admitting: *Deleted

## 2016-08-19 NOTE — Telephone Encounter (Signed)
Called patient to remind of HDR Tx. For 08-20-16 @ 1 pm, spoke with patient and she is aware of this tx.

## 2016-08-20 ENCOUNTER — Ambulatory Visit: Payer: Medicare Other

## 2016-08-20 ENCOUNTER — Telehealth: Payer: Self-pay | Admitting: Oncology

## 2016-08-20 ENCOUNTER — Ambulatory Visit
Admission: RE | Admit: 2016-08-20 | Discharge: 2016-08-20 | Disposition: A | Discharge status: Home / Self Care | Payer: Medicare Other | Source: Ambulatory Visit | Attending: Radiation Oncology | Admitting: Radiation Oncology

## 2016-08-20 ENCOUNTER — Telehealth: Payer: Self-pay

## 2016-08-20 VITALS — BP 160/96 | HR 86 | Temp 97.7°F

## 2016-08-20 DIAGNOSIS — C541 Malignant neoplasm of endometrium: Secondary | ICD-10-CM | POA: Diagnosis not present

## 2016-08-20 NOTE — Progress Notes (Signed)
  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 She will be treated with a 3 cm diameter segmented cylinder. This conforms to her anatomy without undue discomfort.  Vaginal brachytherapy procedure node: The patient was brought to the [place] suite. Identity was confirmed. All relevant records and images related to the planned course of therapy were reviewed. The patient freely provided informed written consent to proceed with treatment after reviewing the details related to the planned course of therapy. The consent form was witnessed and verified by the simulation staff. Then, the patient was set-up in a stable reproducible supine position for radiation therapy. Pelvic exam revealed the cuff to be intact. The patient's custom vaginal cylinder was placed in the proximal vagina. This was affixed to the CT/MR stabilization plate to prevent slippage. Patient tolerated the placement well.  Verification simulation note:  A fiducial marker was placed within the vaginal cylinder. An AP and lateral film was then obtained through the pelvis area. This documented accurate position of the vaginal cylinder for treatment.  HDR BRACHYTHERAPY TREATMENT  The remote afterloading device was affixed to the vaginal cylinder by catheter. Patient then proceeded to undergo her third high-dose-rate treatment directed at the proximal vagina. The patient was prescribed a dose of 6 Gy gray to be delivered to the mucosal surface. Treatment length was 3 cm. Patient was treated with 1 channel using 7 dwell positions. Treatment time was 240.6 seconds. Iridium 192 was the high-dose-rate source for  treatment. The patient tolerated the treatment well. After completion of her therapy, a radiation survey was performed documenting return of the iridium source into the [employer] safe.   PLAN: She will return next week for her fourth high-dose-rate treatment. Patient was assessed prior to and after her high-dose-rate addition therapy. She is noted to have fluctuations in her blood pressure but heart rate and peripheral pulse is been steady and strong. Patient is drinking in plenty of fluids and it is doubtful she is dehydrated. Patient will return on Friday for blood work and to check to see if she would be a candidate for additional chemotherapy. Her treatments were postponed last week in light of blood counts.  -----------------------------------  Blair Promise, PhD, MD  This document serves as a record of services personally performed by Gery Pray, MD. It was created on his behalf by Maryla Morrow, a trained medical scribe. The creation of this record is based on the scribe's personal observations and the provider's statements to them. This document has been checked and approved by the attending provider.

## 2016-08-20 NOTE — Telephone Encounter (Signed)
Kayla Guerrero mention Hip and leg pain 08-18-16 in phone conversation. Onset ~08-17-16 as noted in next paragraph.  Kayla Guerrero noted that she woke up about 4 am the last 2 mornings with achiness in hips to her feet.  It gradually improves by the afternoon.  She has not taken any aleve as this constipates her. Suggested she try aleve 2 tabs tonight at HS with food.  Increase miralax and senokot if needed for bowels. Kayla Guerrero verbalized understanding.

## 2016-08-20 NOTE — Telephone Encounter (Signed)
Kayla Share, RN from Dr. Mariana Kaufman office called and said that Kayla Guerrero is having bilateral leg pain and lower back pain.  The patient was advised to try Aleve and Gabapentin.  She also received granix last weekend.  Kayla Guerrero said the patient only was able to sleep 3 hours last night due to the pain.  Advised her that Dr. Sondra Come will be notified as patient is coming in for HDR treatment today.

## 2016-08-20 NOTE — Progress Notes (Addendum)
[id number] Therapist came around to have a nurse come and check vital signs because the patient did not feel good.  Has been having back pain. Alert and oriented x 3, skin warm and dry. BP (!) 175/101   Pulse 82   Temp 97.7 F (36.5 C) (Oral) 100 % sitting 1313  144/89   Pulse 92 standing 1315  Repeated 172/88   Pulse  82 sitting  States she is feeling better now sitting

## 2016-08-20 NOTE — Telephone Encounter (Addendum)
Ms Weir called this morning stating that her hips and legs are aching during the day now as well as at night.  She has intermittently taken  Gabapentin 100 mg and aleve with no effect. She only slept for 3 hours last night. She does not have any swelling or redness in legs and  can bear weight on legs.  She has not used her Oxycodone.  Suggested that she take 1/2 now and bring tabs with her as she could take another half prior to HDR if needed.  Husband is driving. Pt stated that she would try this.  Spoke with Dr. Clabe Seal nurse Elmo Putt, RN and discussed the above with her. Dr. Sondra Come and evaluate patient while here for HDR.  Relayed this information  To Ms. Goodbar.

## 2016-08-22 ENCOUNTER — Ambulatory Visit: Payer: Medicare Other

## 2016-08-22 ENCOUNTER — Other Ambulatory Visit: Payer: Self-pay | Admitting: Oncology

## 2016-08-22 ENCOUNTER — Other Ambulatory Visit (HOSPITAL_BASED_OUTPATIENT_CLINIC_OR_DEPARTMENT_OTHER): Payer: Medicare Other

## 2016-08-22 ENCOUNTER — Ambulatory Visit (HOSPITAL_BASED_OUTPATIENT_CLINIC_OR_DEPARTMENT_OTHER): Payer: Medicare Other

## 2016-08-22 VITALS — BP 173/81 | HR 84 | Temp 97.5°F | Resp 16

## 2016-08-22 DIAGNOSIS — Z5111 Encounter for antineoplastic chemotherapy: Secondary | ICD-10-CM

## 2016-08-22 DIAGNOSIS — C541 Malignant neoplasm of endometrium: Secondary | ICD-10-CM

## 2016-08-22 DIAGNOSIS — Z95828 Presence of other vascular implants and grafts: Secondary | ICD-10-CM

## 2016-08-22 LAB — CBC WITH DIFFERENTIAL/PLATELET
BASO%: 0.1 % (ref 0.0–2.0)
BASOS ABS: 0 10*3/uL (ref 0.0–0.1)
EOS ABS: 0 10*3/uL (ref 0.0–0.5)
EOS%: 0 % (ref 0.0–7.0)
HEMATOCRIT: 36.5 % (ref 34.8–46.6)
HEMOGLOBIN: 11.9 g/dL (ref 11.6–15.9)
LYMPH#: 0.5 10*3/uL — AB (ref 0.9–3.3)
LYMPH%: 9.2 % — ABNORMAL LOW (ref 14.0–49.7)
MCH: 29.9 pg (ref 25.1–34.0)
MCHC: 32.6 g/dL (ref 31.5–36.0)
MCV: 91.6 fL (ref 79.5–101.0)
MONO#: 0 10*3/uL — AB (ref 0.1–0.9)
MONO%: 0.6 % (ref 0.0–14.0)
NEUT%: 90.1 % — ABNORMAL HIGH (ref 38.4–76.8)
NEUTROS ABS: 4.8 10*3/uL (ref 1.5–6.5)
PLATELETS: 357 10*3/uL (ref 145–400)
RBC: 3.98 10*6/uL (ref 3.70–5.45)
RDW: 19.2 % — AB (ref 11.2–14.5)
WBC: 5.3 10*3/uL (ref 3.9–10.3)

## 2016-08-22 LAB — COMPREHENSIVE METABOLIC PANEL
ALBUMIN: 3.2 g/dL — AB (ref 3.5–5.0)
ALK PHOS: 72 U/L (ref 40–150)
ALT: 8 U/L (ref 0–55)
ANION GAP: 11 meq/L (ref 3–11)
AST: 18 U/L (ref 5–34)
BILIRUBIN TOTAL: 0.22 mg/dL (ref 0.20–1.20)
BUN: 12.3 mg/dL (ref 7.0–26.0)
CALCIUM: 9.1 mg/dL (ref 8.4–10.4)
CO2: 23 mEq/L (ref 22–29)
Chloride: 102 mEq/L (ref 98–109)
Creatinine: 0.8 mg/dL (ref 0.6–1.1)
EGFR: 72 mL/min/{1.73_m2} — AB (ref >90)
GLUCOSE: 159 mg/dL — AB (ref 70–140)
POTASSIUM: 4.1 meq/L (ref 3.5–5.1)
SODIUM: 136 meq/L (ref 136–145)
TOTAL PROTEIN: 7 g/dL (ref 6.4–8.3)

## 2016-08-22 MED ORDER — HEPARIN SOD (PORK) LOCK FLUSH 100 UNIT/ML IV SOLN
500.0000 [IU] | Freq: Once | INTRAVENOUS | Status: AC | PRN
  Administered 2016-08-22: 500 [IU]
  Filled 2016-08-22: qty 5

## 2016-08-22 MED ORDER — SODIUM CHLORIDE 0.9 % IV SOLN
417.0000 mg | Freq: Once | INTRAVENOUS | Status: AC
  Administered 2016-08-22: 420 mg via INTRAVENOUS
  Filled 2016-08-22: qty 42

## 2016-08-22 MED ORDER — ONDANSETRON HCL 8 MG PO TABS
8.0000 mg | ORAL_TABLET | Freq: Once | ORAL | Status: AC
  Administered 2016-08-22: 8 mg via ORAL

## 2016-08-22 MED ORDER — SODIUM CHLORIDE 0.9 % IV SOLN
Freq: Once | INTRAVENOUS | Status: AC
  Administered 2016-08-22: 14:00:00 via INTRAVENOUS
  Filled 2016-08-22: qty 5

## 2016-08-22 MED ORDER — SODIUM CHLORIDE 0.9% FLUSH
10.0000 mL | INTRAVENOUS | Status: DC | PRN
  Administered 2016-08-22: 10 mL via INTRAVENOUS
  Filled 2016-08-22: qty 10

## 2016-08-22 MED ORDER — ONDANSETRON HCL 8 MG PO TABS
ORAL_TABLET | ORAL | Status: AC
  Filled 2016-08-22: qty 1

## 2016-08-22 MED ORDER — SODIUM CHLORIDE 0.9% FLUSH
10.0000 mL | INTRAVENOUS | Status: DC | PRN
  Administered 2016-08-22: 10 mL
  Filled 2016-08-22: qty 10

## 2016-08-22 MED ORDER — SODIUM CHLORIDE 0.9 % IV SOLN
Freq: Once | INTRAVENOUS | Status: AC
  Administered 2016-08-22: 13:00:00 via INTRAVENOUS

## 2016-08-22 NOTE — Patient Instructions (Signed)
[facility] Discharge Instructions for Patients Receiving Chemotherapy  Today you received the following chemotherapy agents: Carboplatin   To help prevent nausea and vomiting after your treatment, we encourage you to take your nausea medication as directed.    If you develop nausea and vomiting that is not controlled by your nausea medication, call the clinic.   BELOW ARE SYMPTOMS THAT SHOULD BE REPORTED IMMEDIATELY:  *FEVER GREATER THAN 100.5 F  *CHILLS WITH OR WITHOUT FEVER  NAUSEA AND VOMITING THAT IS NOT CONTROLLED WITH YOUR NAUSEA MEDICATION  *UNUSUAL SHORTNESS OF BREATH  *UNUSUAL BRUISING OR BLEEDING  TENDERNESS IN MOUTH AND THROAT WITH OR WITHOUT PRESENCE OF ULCERS  *URINARY PROBLEMS  *BOWEL PROBLEMS  UNUSUAL RASH Items with * indicate a potential emergency and should be followed up as soon as possible.  Feel free to call the clinic you have any questions or concerns. The clinic phone number is [phone].  Please show the CHEMO ALERT CARD at check-in to the Emergency Department and triage nurse.   

## 2016-08-23 ENCOUNTER — Ambulatory Visit (HOSPITAL_BASED_OUTPATIENT_CLINIC_OR_DEPARTMENT_OTHER): Payer: Medicare Other

## 2016-08-23 VITALS — BP 160/69 | HR 71 | Temp 97.7°F | Resp 18

## 2016-08-23 DIAGNOSIS — C541 Malignant neoplasm of endometrium: Secondary | ICD-10-CM

## 2016-08-23 DIAGNOSIS — R55 Syncope and collapse: Secondary | ICD-10-CM

## 2016-08-23 DIAGNOSIS — I951 Orthostatic hypotension: Secondary | ICD-10-CM

## 2016-08-23 MED ORDER — TBO-FILGRASTIM 300 MCG/0.5ML ~~LOC~~ SOSY
300.0000 ug | PREFILLED_SYRINGE | Freq: Once | SUBCUTANEOUS | Status: AC
  Administered 2016-08-23: 300 ug via SUBCUTANEOUS

## 2016-08-23 MED ORDER — SODIUM CHLORIDE 0.9% FLUSH
10.0000 mL | INTRAVENOUS | Status: DC | PRN
  Administered 2016-08-23: 10 mL via INTRAVENOUS
  Filled 2016-08-23: qty 10

## 2016-08-23 MED ORDER — HEPARIN SOD (PORK) LOCK FLUSH 100 UNIT/ML IV SOLN
500.0000 [IU] | Freq: Once | INTRAVENOUS | Status: AC
  Administered 2016-08-23: 500 [IU] via INTRAVENOUS
  Filled 2016-08-23: qty 5

## 2016-08-23 MED ORDER — SODIUM CHLORIDE 0.9 % IV SOLN
INTRAVENOUS | Status: DC
  Administered 2016-08-23: 09:00:00 via INTRAVENOUS

## 2016-08-24 ENCOUNTER — Other Ambulatory Visit: Payer: Self-pay | Admitting: Oncology

## 2016-08-25 ENCOUNTER — Ambulatory Visit (HOSPITAL_BASED_OUTPATIENT_CLINIC_OR_DEPARTMENT_OTHER): Payer: Medicare Other

## 2016-08-25 ENCOUNTER — Other Ambulatory Visit: Payer: Medicare Other

## 2016-08-25 ENCOUNTER — Other Ambulatory Visit (HOSPITAL_BASED_OUTPATIENT_CLINIC_OR_DEPARTMENT_OTHER): Payer: Medicare Other

## 2016-08-25 ENCOUNTER — Telehealth: Payer: Self-pay | Admitting: Oncology

## 2016-08-25 ENCOUNTER — Ambulatory Visit (HOSPITAL_BASED_OUTPATIENT_CLINIC_OR_DEPARTMENT_OTHER): Payer: Medicare Other | Admitting: Oncology

## 2016-08-25 ENCOUNTER — Encounter: Payer: Self-pay | Admitting: Oncology

## 2016-08-25 VITALS — BP 173/78 | HR 90 | Temp 97.6°F | Resp 18 | Ht 66.0 in | Wt 177.4 lb

## 2016-08-25 DIAGNOSIS — I951 Orthostatic hypotension: Secondary | ICD-10-CM

## 2016-08-25 DIAGNOSIS — C541 Malignant neoplasm of endometrium: Secondary | ICD-10-CM

## 2016-08-25 DIAGNOSIS — Z95828 Presence of other vascular implants and grafts: Secondary | ICD-10-CM

## 2016-08-25 DIAGNOSIS — N289 Disorder of kidney and ureter, unspecified: Secondary | ICD-10-CM

## 2016-08-25 DIAGNOSIS — Z5189 Encounter for other specified aftercare: Secondary | ICD-10-CM | POA: Diagnosis not present

## 2016-08-25 DIAGNOSIS — G620 Drug-induced polyneuropathy: Secondary | ICD-10-CM

## 2016-08-25 DIAGNOSIS — T451X5A Adverse effect of antineoplastic and immunosuppressive drugs, initial encounter: Secondary | ICD-10-CM

## 2016-08-25 DIAGNOSIS — D701 Agranulocytosis secondary to cancer chemotherapy: Secondary | ICD-10-CM

## 2016-08-25 LAB — COMPREHENSIVE METABOLIC PANEL
ALT: 10 U/L (ref 0–55)
AST: 22 U/L (ref 5–34)
Albumin: 3.3 g/dL — ABNORMAL LOW (ref 3.5–5.0)
Alkaline Phosphatase: 81 U/L (ref 40–150)
Anion Gap: 10 mEq/L (ref 3–11)
BUN: 13.9 mg/dL (ref 7.0–26.0)
CHLORIDE: 102 meq/L (ref 98–109)
CO2: 28 mEq/L (ref 22–29)
Calcium: 9.2 mg/dL (ref 8.4–10.4)
Creatinine: 0.8 mg/dL (ref 0.6–1.1)
EGFR: 70 mL/min/{1.73_m2} — ABNORMAL LOW (ref >90)
Glucose: 93 mg/dl (ref 70–140)
POTASSIUM: 3.9 meq/L (ref 3.5–5.1)
Sodium: 140 mEq/L (ref 136–145)
Total Bilirubin: 0.39 mg/dL (ref 0.20–1.20)
Total Protein: 6.6 g/dL (ref 6.4–8.3)

## 2016-08-25 LAB — CBC WITH DIFFERENTIAL/PLATELET
BASO%: 0.2 % (ref 0.0–2.0)
BASOS ABS: 0 10*3/uL (ref 0.0–0.1)
EOS%: 0.4 % (ref 0.0–7.0)
Eosinophils Absolute: 0.1 10*3/uL (ref 0.0–0.5)
HCT: 36.2 % (ref 34.8–46.6)
HEMOGLOBIN: 11.9 g/dL (ref 11.6–15.9)
LYMPH#: 1.5 10*3/uL (ref 0.9–3.3)
LYMPH%: 11.4 % — ABNORMAL LOW (ref 14.0–49.7)
MCH: 29.9 pg (ref 25.1–34.0)
MCHC: 32.8 g/dL (ref 31.5–36.0)
MCV: 91.1 fL (ref 79.5–101.0)
MONO#: 0.3 10*3/uL (ref 0.1–0.9)
MONO%: 2.6 % (ref 0.0–14.0)
NEUT#: 11 10*3/uL — ABNORMAL HIGH (ref 1.5–6.5)
NEUT%: 85.4 % — AB (ref 38.4–76.8)
Platelets: 319 10*3/uL (ref 145–400)
RBC: 3.97 10*6/uL (ref 3.70–5.45)
RDW: 19.4 % — AB (ref 11.2–14.5)
WBC: 12.9 10*3/uL — ABNORMAL HIGH (ref 3.9–10.3)

## 2016-08-25 MED ORDER — TBO-FILGRASTIM 300 MCG/0.5ML ~~LOC~~ SOSY
300.0000 ug | PREFILLED_SYRINGE | Freq: Once | SUBCUTANEOUS | Status: DC
  Administered 2016-08-25: 300 ug via SUBCUTANEOUS

## 2016-08-25 NOTE — Patient Instructions (Signed)

## 2016-08-25 NOTE — Telephone Encounter (Signed)
Gave patient avs report and appointments for November and December  °

## 2016-08-25 NOTE — Progress Notes (Signed)
OFFICE PROGRESS NOTE   August 25, 2016   Physicians:  Tamala Julian (PCP), Lelon Mast [place], F.Alucio, Monna Fam (ophth), Szott (dentist), Hiatt (podiatry)  INTERVAL HISTORY:  Patient is seen, together with husband, in continuing attention to adjuvant chemotherapy in process for IB serous carcinoma of endometrium. She had first 3 cycles with taxol + carboplatin, then cycle 4 on 08-22-16 as [place] only due to several syncopal episodes and peripheral neuropathy after cycle 3.  She will have HDR 11-29 and 09-03-16, to complete that course.  Cycle 4 was delayed a week due to leukopenia despite gCSF, given on 08-22-16. With delay of a week, patient did not understand that she did not need premed steroids, so took those also prior to single agent [place]; she had granix 300 mg 11-25 and today. She also had IVF on 11-25, also given due to syncopal episodes after cycle 3. Patient has had no syncopal episodes and no clear orthostatic symptoms since most recent chemo. Blood pressures at this office 134/61 and 160/69 with IVF etc. She has checked BPs at home, but not in last few days and did not bring list of the readings. She has been fatigued and has had some aches in legs likely from granix. She has not noticed improvement in neuropathy in feet and fingers, right fingers moreso than left, reports difficulty turning pages such that she has to use her left hand to turn the pages. She reports some difficulty walking due to "feet numb". Nausea seems minimal and occasional, no vomiting. She has been eating, is doing better drinking fluids. Bowels are moving. No SOB, no fever, no bleeding, no problem with PAC, no abdominal or pelvic discomfort, no LE swelling.    Remainder of 10 point Review of Systems negative   PAC placed by IR 08-11-16 Flu vaccine 06-23-16 No genetics testing  Cruise is planned Jan 28 x 7 days. I have told them again that I can do letter if  they need to reschedule this. Cycle 6 chemo due on schedule 09-25-16, which may not give adequate time for recovery prior to trip, given complications during treatment thus far.   ONCOLOGIC HISTORY Patient presented to PCP with new vaginal discharge and some vaginal spotting, referred to Dr Dory Horn, whom she had known previously. D&C on 04-09-16 had high grade carcinoma favoring serous histology [phone]). She was referred to gyn oncology, saw Dr Denman George on 04-23-16. CT CAP 04-30-16 no evidence of metastatic disease, no pelvic adenopathy, no ascites, thickened endometrial canal. On 05-06-16 Dr Alycia Rossetti performed robotic hysterectomy BSO bilateral pelvic and paraaortic node evaluation (failed sentinel LN mapping); patient was DC home on 05-07-16. Pathology [phone]) had mixed endometrioid (80%) and serous (20%) carcinoma 4 cm size, invading 2.0 cm into myometrium 2.1 cm thick, + LVSI, Total 11 bilateral pelvic and paraaortic nodes negative, no washings. Post operative course was remarkable for acute onset left groin and thigh pain ~ 6 days post op, with repeat CT 05-16-16 without findings specific for those complaints; she was treated with neurontin and percocet, tapering now as symptoms have progressively improved. First carboplatin taxol given 06-11-16, neutropenic with ANC 0.9 on day 9 cycle 1, granix added. She had several syncopal episodes around cycle 3, ED evaluations then. Cycle 4 was delayed a week for leukopenia and given as carboplatin only, with gCSF and additional IVF.  HDR x 5 given from 08-06-16 thru 09-04-16.   Objective:  Vital signs in last 24 hours:  BP (!) 173/78 (BP Location: Left Arm, Patient Position: Sitting) Comment: informed Louise  Pulse 90   Temp 97.6 F (36.4 C) (Oral)   Resp 18   Ht _0  (1.676 m)   Wt 177 lb 6.4 oz (80.5 kg)   SpO2 98%   BMI 28.63 kg/m  Weight is up 0.5 lb Alert, oriented and appropriate. Looks comfortable, in good spirits. Ambulatory without  assistance, able to get on and off exam table, moves and changes positions quickly. She is wearing support stockings Alopecia  HEENT:PERRL, sclerae not icteric. Oral mucosa moist without lesions, posterior pharynx clear.  Neck supple. No JVD.  Lymphatics:no cervical,supraclavicular or inguinal adenopathy Resp: clear to auscultation bilaterally and normal percussion bilaterally Cardio: regular rate and rhythm. No gallop. GI: soft, nontender, not distended, no mass or organomegaly. Normally active bowel sounds. Surgical incisions not remarkable. Musculoskeletal/ Extremities: LE without pitting edema, cords, tenderness Neuro:  peripheral neuropathy fingers and feet as above. Otherwise nonfocal. PSYCH brighter mood and affect Skin without rash, ecchymosis, petechiae Portacath-without erythema or tenderness  Lab Results:  Results for orders placed or performed in visit on 08/25/16  CBC with Differential  Result Value Ref Range   WBC 12.9 (H) 3.9 - 10.3 10e3/uL   NEUT# 11.0 (H) 1.5 - 6.5 10e3/uL   HGB 11.9 11.6 - 15.9 g/dL   HCT 36.2 34.8 - 46.6 %   Platelets 319 145 - 400 10e3/uL   MCV 91.1 79.5 - 101.0 fL   MCH 29.9 25.1 - 34.0 pg   MCHC 32.8 31.5 - 36.0 g/dL   RBC 3.97 3.70 - 5.45 10e6/uL   RDW 19.4 (H) 11.2 - 14.5 %   lymph# 1.5 0.9 - 3.3 10e3/uL   MONO# 0.3 0.1 - 0.9 10e3/uL   Eosinophils Absolute 0.1 0.0 - 0.5 10e3/uL   Basophils Absolute 0.0 0.0 - 0.1 10e3/uL   NEUT% 85.4 (H) 38.4 - 76.8 %   LYMPH% 11.4 (L) 14.0 - 49.7 %   MONO% 2.6 0.0 - 14.0 %   EOS% 0.4 0.0 - 7.0 %   BASO% 0.2 0.0 - 2.0 %  Comprehensive metabolic panel  Result Value Ref Range   Sodium 140 136 - 145 mEq/L   Potassium 3.9 3.5 - 5.1 mEq/L   Chloride 102 98 - 109 mEq/L   CO2 28 22 - 29 mEq/L   Glucose 93 70 - 140 mg/dl   BUN 13.9 7.0 - 26.0 mg/dL   Creatinine 0.8 0.6 - 1.1 mg/dL   Total Bilirubin 0.39 0.20 - 1.20 mg/dL   Alkaline Phosphatase 81 40 - 150 U/L   AST 22 5 - 34 U/L   ALT 10 0 - 55 U/L    Total Protein 6.6 6.4 - 8.3 g/dL   Albumin 3.3 (L) 3.5 - 5.0 g/dL   Calcium 9.2 8.4 - 10.4 mg/dL   Anion Gap 10 3 - 11 mEq/L   EGFR 70 (L) >90 ml/min/1.73 m2     Studies/Results:  No results found.  Medications: I have reviewed the patient's current medications. She is not using neurontin, which she did not think helped chemo peripheral neuropathy symptoms, will not refill.  DISCUSSION We are pleased that she has had no significant problems since cycle 4 chemo with [place] only, particularly no syncope or orthostatic problems. I have encouraged her to continue good oral hydration and the support stockings; she should bring in her home BP readings at next visit.  The peripheral neuropathy is concerning, however, especially as  it seems to be interfering with ambulation and with fine motor activity. They understand that our preference from standpoint of the endometrial cancer would be to complete last 2 cycles with taxane + carboplatin, however that may not be feasible given this degree of peripheral neuropathy. I note particularly that she had several falls after cycle 3, which seemed to be orthostatic, however she is clearly at risk for falls independent of any neuropathy in feet.  We have decided to see how she is at visit 12-4, with decision then about taxane with cycle 5. Unless much improved, I do not think it will be safe to give additional taxane then.      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 Adjuvant carboplatin taxol begun 06-11-16, neutropenic day 9. Vaginal brachytherapy comcomitant withchemotherapy underway by Dr Sondra Come. Cycle 4 carboplatin only given 08-22-16, with IVF and granix.  2.Several syncopal episodes at home and at [facility] following cycle 3 chemo. Possibly dehydration and vasovagal, but has been quite problematic, with 2  ED evaluations  and PCP visit. Home BP monitoring and  Teds/ oral hydration as above.  Held taxol cycle 4, additional IVF.  3..chemo peripheral neuropathy feet and fingers: now interfering with fine motor activities and noticeable with ambulation, may not be able to tolerate additional taxane. See above. 4. PAC placed by IR 08-11-16, very helpful already. 5.neuropathic pain left inguinal area postoperatively, no clear etiology on repeat CT AP then. Improved. Off neurontin. 6.hypothyroid on replacement by PCP 7.post cholecystectomy and remote BTL, past GERD 8.Up to date mammograms and colonoscopy 9.Atherosclerotic thoracic and abdominal aorta by CT 10.Degenerative arthritis spine 11.intentional weight loss in past year, ~ 40 lbs, weight stable and better hydrated now. 12..Indeterminate right renal lesion on CT 13.flu vaccine 06-23-16   All questions answered and they know to call if concerns prior to MD visit next week. Time spent 25 min including >50% counseling and coordination of care. CC Drs Sondra Come, Regan Lemming, MD   08/25/2016, 6:15 PM

## 2016-08-26 ENCOUNTER — Telehealth: Payer: Self-pay | Admitting: *Deleted

## 2016-08-26 NOTE — Telephone Encounter (Signed)
Called patient to remind of 10 am - HDR Tx. for 08-27-16, lvm for a return call

## 2016-08-27 ENCOUNTER — Ambulatory Visit
Admission: RE | Admit: 2016-08-27 | Discharge: 2016-08-27 | Disposition: A | Discharge status: Home / Self Care | Payer: Medicare Other | Source: Ambulatory Visit | Attending: Radiation Oncology | Admitting: Radiation Oncology

## 2016-08-27 DIAGNOSIS — C541 Malignant neoplasm of endometrium: Secondary | ICD-10-CM

## 2016-08-27 DIAGNOSIS — Z95828 Presence of other vascular implants and grafts: Secondary | ICD-10-CM | POA: Insufficient documentation

## 2016-08-27 NOTE — Progress Notes (Signed)
  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 She will be treated with a 3 cm diameter segmented cylinder. This conforms to her anatomy without undue discomfort.  Vaginal brachytherapy procedure node: The patient was brought to the [place] suite. Identity was confirmed. All relevant records and images related to the planned course of therapy were reviewed. The patient freely provided informed written consent to proceed with treatment after reviewing the details related to the planned course of therapy. The consent form was witnessed and verified by the simulation staff. Then, the patient was set-up in a stable reproducible supine position for radiation therapy. Pelvic exam revealed the cuff to be intact. The patient's custom vaginal cylinder was placed in the proximal vagina. This was affixed to the CT/MR stabilization plate to prevent slippage. Patient tolerated the placement well.  Verification simulation note:  A fiducial marker was placed within the vaginal cylinder. An AP and lateral film was then obtained through the pelvis area. This documented accurate position of the vaginal cylinder for treatment.  HDR BRACHYTHERAPY TREATMENT  The remote afterloading device was affixed to the vaginal cylinder by catheter. Patient then proceeded to undergo her fourth high-dose-rate treatment directed at the proximal vagina. The patient was prescribed a dose of 6 Gy gray to be delivered to the mucosal surface. Treatment length was 3 cm. Patient was treated with 1 channel using 7 dwell positions. Treatment time was 256.9 seconds. Iridium 192 was the high-dose-rate source for  treatment. The patient tolerated the treatment well. After completion of her therapy, a radiation survey was performed documenting return of the iridium source into the [employer] safe.   PLAN: She will return next week for her fifth high-dose-rate treatment. -----------------------------------  Blair Promise, PhD, MD  This document serves as a record of services personally performed by Gery Pray, MD. It was created on his behalf by Darcus Austin, a trained medical scribe. The creation of this record is based on the scribe's personal observations and the provider's statements to them. This document has been checked and approved by the attending provider.

## 2016-08-29 ENCOUNTER — Ambulatory Visit: Payer: Medicare Other | Admitting: Oncology

## 2016-08-29 ENCOUNTER — Encounter: Payer: Self-pay | Admitting: General Practice

## 2016-08-29 ENCOUNTER — Other Ambulatory Visit: Payer: Medicare Other

## 2016-08-29 ENCOUNTER — Ambulatory Visit: Payer: Medicare Other

## 2016-08-29 NOTE — Progress Notes (Signed)
[facility] Note  Attempted f/u by phone, but no answer after many rings.  Plan to check in personally when pt has appt with Dr Marko Plume on Monday 12/4.   [place], [place], [facility] Pager [phone] Voicemail [phone]

## 2016-09-01 ENCOUNTER — Ambulatory Visit (HOSPITAL_BASED_OUTPATIENT_CLINIC_OR_DEPARTMENT_OTHER): Payer: Medicare Other

## 2016-09-01 ENCOUNTER — Ambulatory Visit (HOSPITAL_BASED_OUTPATIENT_CLINIC_OR_DEPARTMENT_OTHER): Payer: Medicare Other | Admitting: Oncology

## 2016-09-01 ENCOUNTER — Encounter: Payer: Self-pay | Admitting: Oncology

## 2016-09-01 ENCOUNTER — Other Ambulatory Visit (HOSPITAL_BASED_OUTPATIENT_CLINIC_OR_DEPARTMENT_OTHER): Payer: Medicare Other

## 2016-09-01 ENCOUNTER — Encounter: Payer: Self-pay | Admitting: General Practice

## 2016-09-01 VITALS — BP 175/85 | HR 88 | Temp 98.2°F | Resp 17 | Ht 66.0 in | Wt 176.0 lb

## 2016-09-01 DIAGNOSIS — T451X5A Adverse effect of antineoplastic and immunosuppressive drugs, initial encounter: Secondary | ICD-10-CM

## 2016-09-01 DIAGNOSIS — E039 Hypothyroidism, unspecified: Secondary | ICD-10-CM

## 2016-09-01 DIAGNOSIS — G620 Drug-induced polyneuropathy: Secondary | ICD-10-CM

## 2016-09-01 DIAGNOSIS — C541 Malignant neoplasm of endometrium: Secondary | ICD-10-CM

## 2016-09-01 DIAGNOSIS — N289 Disorder of kidney and ureter, unspecified: Secondary | ICD-10-CM

## 2016-09-01 DIAGNOSIS — I951 Orthostatic hypotension: Secondary | ICD-10-CM

## 2016-09-01 DIAGNOSIS — Z95828 Presence of other vascular implants and grafts: Secondary | ICD-10-CM

## 2016-09-01 LAB — COMPREHENSIVE METABOLIC PANEL
ALBUMIN: 3.3 g/dL — AB (ref 3.5–5.0)
ALK PHOS: 73 U/L (ref 40–150)
ALT: 10 U/L (ref 0–55)
AST: 23 U/L (ref 5–34)
Anion Gap: 9 mEq/L (ref 3–11)
BILIRUBIN TOTAL: 0.37 mg/dL (ref 0.20–1.20)
BUN: 11.2 mg/dL (ref 7.0–26.0)
CALCIUM: 8.8 mg/dL (ref 8.4–10.4)
CO2: 24 mEq/L (ref 22–29)
Chloride: 104 mEq/L (ref 98–109)
Creatinine: 0.7 mg/dL (ref 0.6–1.1)
EGFR: 79 mL/min/{1.73_m2} — AB (ref >90)
GLUCOSE: 88 mg/dL (ref 70–140)
POTASSIUM: 4.1 meq/L (ref 3.5–5.1)
Sodium: 137 mEq/L (ref 136–145)
TOTAL PROTEIN: 6.4 g/dL (ref 6.4–8.3)

## 2016-09-01 LAB — CBC WITH DIFFERENTIAL/PLATELET
BASO%: 0.7 % (ref 0.0–2.0)
BASOS ABS: 0 10*3/uL (ref 0.0–0.1)
EOS ABS: 0 10*3/uL (ref 0.0–0.5)
EOS%: 1 % (ref 0.0–7.0)
HEMATOCRIT: 30.5 % — AB (ref 34.8–46.6)
HGB: 10.3 g/dL — ABNORMAL LOW (ref 11.6–15.9)
LYMPH#: 1.1 10*3/uL (ref 0.9–3.3)
LYMPH%: 37.6 % (ref 14.0–49.7)
MCH: 30.4 pg (ref 25.1–34.0)
MCHC: 33.8 g/dL (ref 31.5–36.0)
MCV: 90 fL (ref 79.5–101.0)
MONO#: 0.3 10*3/uL (ref 0.1–0.9)
MONO%: 8.6 % (ref 0.0–14.0)
NEUT#: 1.6 10*3/uL (ref 1.5–6.5)
NEUT%: 52.1 % (ref 38.4–76.8)
PLATELETS: 207 10*3/uL (ref 145–400)
RBC: 3.39 10*6/uL — AB (ref 3.70–5.45)
RDW: 17.3 % — ABNORMAL HIGH (ref 11.2–14.5)
WBC: 3 10*3/uL — ABNORMAL LOW (ref 3.9–10.3)

## 2016-09-01 MED ORDER — SODIUM CHLORIDE 0.9% FLUSH
10.0000 mL | INTRAVENOUS | Status: DC | PRN
  Administered 2016-09-01: 10 mL via INTRAVENOUS
  Filled 2016-09-01: qty 10

## 2016-09-01 MED ORDER — HEPARIN SOD (PORK) LOCK FLUSH 100 UNIT/ML IV SOLN
500.0000 [IU] | Freq: Once | INTRAVENOUS | Status: AC
  Administered 2016-09-01: 500 [IU] via INTRAVENOUS
  Filled 2016-09-01: qty 5

## 2016-09-01 NOTE — Progress Notes (Signed)
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  First 3 cycles were carboplatin + taxol, not tolerated well including neutropenia,  multiple syncopal episodes and increasing peripheral neuropathy. Cycle 4 on 11-24 (delayed x 1 week with leukopenia) was carboplatin only due to peripheral neuropathy, with granix 300 mg and IVF on days 2 and 4. HDR to complete 09-03-16.  Patient had no orthostatic symptoms or syncope, continues to push sports drinks and wear support stockings. She reports BPs in good range at home, not quite as high as recorded here. She had severe aching in hips and thighs "for 5 days", beginning after granix and worse at night, not improved with daily claritin, heat, percocet 1 tablet on two occasions or naproxen; husband tells me that she was crying from pain, and they are both crying in exam room as they describe this problem. Peripheral neuropathy seems unchanged, moreso right hand than left as previously, such that she needs to make adjustments in regular activities. Difficult to tell how much neuropathy in feet is impacting activity.  She has been eating, no significant nausea. She has not felt able to go to water exercises. Bowels are moving. No fever or symptoms of infection. No bleeding. No SOB. Remainder of 10 point Review of Systems negative.    PAC placed by IR 08-11-16 Flu vaccine 06-23-16 No genetics testing  ONCOLOGIC HISTORY Patient presented to PCP with new vaginal discharge and some vaginal spotting, referred to Dr Dory Horn, whom she had known previously. D&C on 04-09-16 had high grade carcinoma favoring serous histology [phone]). She was  referred to gyn oncology, saw Dr Denman George on 04-23-16. CT CAP 04-30-16 no evidence of metastatic disease, no pelvic adenopathy, no ascites, thickened endometrial canal. On 05-06-16 Dr Alycia Rossetti performed robotic hysterectomy BSO bilateral pelvic and paraaortic node evaluation (failed sentinel LN mapping); patient was DC home on 05-07-16. 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 Post operative course was remarkable for acute onset left groin and thigh pain ~ 6 days post op, with repeat CT 05-16-16 without findings specific for those complaints; she was treated with neurontin and percocet, tapering now as symptoms have progressively improved. First carboplatin taxol given 06-11-16, neutropenic with ANC 0.9 on day 9 cycle 1, granix added. She had several syncopal episodes around cycle 3, ED evaluations then. Cycle 4 was delayed a week for leukopenia and given as carboplatin only, with gCSF and additional IVF.  HDR x 5  08-06-16 thru 09-03-16.    Objective:  Vital signs in last 24 hours:  BP (!) 175/85 (BP Location: Right Arm, Patient Position: Sitting) Comment: PT, Bp was taken 2x. second reading in the right arm was better. let the nurse Juliann Pulse know about the BP.   Pulse 88   Temp 98.2 F (36.8 C) (Oral)   Resp 17   Ht '5\' 6"'$  (1.676 m)   Wt 176 lb (79.8 kg)   SpO2 99%   BMI 28.41 kg/m  Weight down 1 lb Alert, oriented and cooperative, tearful describing leg pain last week. Ambulatory without difficulty, able to get on and  off exam table. Respirations not labored.  Alopecia  HEENT:PERRL, sclerae not icteric. Oral mucosa moist without lesions, posterior pharynx clear.  Neck supple. No JVD.  Lymphatics:no cervical,supraclavicular or inguinal adenopathy Resp: clear to auscultation bilaterally and normal percussion bilaterally Cardio: regular  rate and rhythm. No gallop. GI: soft, nontender, not distended, no mass or organomegaly. Normally active bowel sounds. Surgical incisions well healed. Musculoskeletal/ Extremities: LE without pitting edema, cords, tenderness. Is wearing support stockings Neuro: peripheral neuropathy distal fingers/ thumbs, R > L, and distal feet as described. Otherwise nonfocal. PSYCH affect not depressed, tho tearful as above Skin without rash, ecchymosis, petechiae Portacath-without erythema or tenderness  Lab Results:  Results for orders placed or performed in visit on 09/01/16  CBC with Differential  Result Value Ref Range   WBC 3.0 (L) 3.9 - 10.3 10e3/uL   NEUT# 1.6 1.5 - 6.5 10e3/uL   HGB 10.3 (L) 11.6 - 15.9 g/dL   HCT 30.5 (L) 34.8 - 46.6 %   Platelets 207 145 - 400 10e3/uL   MCV 90.0 79.5 - 101.0 fL   MCH 30.4 25.1 - 34.0 pg   MCHC 33.8 31.5 - 36.0 g/dL   RBC 3.39 (L) 3.70 - 5.45 10e6/uL   RDW 17.3 (H) 11.2 - 14.5 %   lymph# 1.1 0.9 - 3.3 10e3/uL   MONO# 0.3 0.1 - 0.9 10e3/uL   Eosinophils Absolute 0.0 0.0 - 0.5 10e3/uL   Basophils Absolute 0.0 0.0 - 0.1 10e3/uL   NEUT% 52.1 38.4 - 76.8 %   LYMPH% 37.6 14.0 - 49.7 %   MONO% 8.6 0.0 - 14.0 %   EOS% 1.0 0.0 - 7.0 %   BASO% 0.7 0.0 - 2.0 %  Comprehensive metabolic panel  Result Value Ref Range   Sodium 137 136 - 145 mEq/L   Potassium 4.1 3.5 - 5.1 mEq/L   Chloride 104 98 - 109 mEq/L   CO2 24 22 - 29 mEq/L   Glucose 88 70 - 140 mg/dl   BUN 11.2 7.0 - 26.0 mg/dL   Creatinine 0.7 0.6 - 1.1 mg/dL   Total Bilirubin 0.37 0.20 - 1.20 mg/dL   Alkaline Phosphatase 73 40 - 150 U/L   AST 23 5 - 34 U/L   ALT 10 0 - 55 U/L   Total Protein 6.4 6.4 - 8.3 g/dL   Albumin 3.3 (L) 3.5 - 5.0 g/dL   Calcium 8.8 8.4 - 10.4 mg/dL   Anion Gap 9 3 - 11 mEq/L   EGFR 79 (L) >90 ml/min/1.73 m2     Studies/Results:  No results found.  Medications: I have reviewed the patient's current medications. Cycle 5 will be [place] only, no premedication  decadron If aches severe after granix next cycle, she will take oxycodone 5-325 two tablets for at least one dose, tho does not need to continue with the oxycodone if not helpful.  DISCUSSION We have discussed interval history and concerns about chemotherapy, particularly peripheral neuropathy with taxol, as well as aches. We have decided to give cycle 5 again as [place] only, then depending on status of neuropathy will consider taxol again with cycle 6.   Patient feels that she can push oral fluids after next chemo and does not need additional IVF. They know to call if she is not able to drink fluids well or has symptomatic low blood pressure.   Patient will be careful about flu/ respiratory illness if she attends holiday events.   Assessment/Plan:   1.IB endometrioid and serous  carcinoma of endometrium: post TAH, BSO and pelvic/ paraaortic node evaluation on 05-06-16. Adjuvant carboplatin taxol begun 06-11-16, cycle 4 delayed a week and given as [place] only.Vaginal brachytherapy underway by Dr Sondra Come. Will give cycle 5 also as carbo only, gCSF necessary tho does cause bone pain.  I will see her prior to cycle 6, decision then re additional taxol. 2.Syncopal episodes at home and at [facility] following cycle 3 chemo. Possibly dehydration and vasovagal.Home BP monitoring and Teds/ oral hydration continuing. Held taxol cycle 4, additional IVF.  3..chemo peripheral neuropathy feet and fingers: now interfering with fine motor activities and noticeable with ambulation, may not be able to tolerate additional taxane. Cycle 5 to be [place] only.  4. PAC placed by IR 08-11-16 5.neuropathic pain left inguinal area postoperatively, no clear etiology on repeat CT AP then. Improved. Off neurontin. 6.hypothyroid on replacement by PCP 7.post cholecystectomy and remote BTL, past GERD 8.Up to date mammograms and colonoscopy 9.Atherosclerotic thoracic and abdominal aorta by CT 10.Degenerative arthritis  spine 11.intentional weight loss in past year, ~ 40 lbs, weight stable and better hydrated now. 12..Indeterminate right renal lesion on CT 13.flu vaccine 06-23-16   All questions answered. Chemo orders adjusted, [facility] pharmacist aware. Time spent 25 min including >50% counseling and coordination of care. Cc PCP, Dr Denman George, Dr Sondra Come    Evlyn Clines, MD   09/03/2016, 4:57 PM

## 2016-09-01 NOTE — Progress Notes (Signed)
[place] Spiritual Care Note  Met Kayla Guerrero following appt with Dr Marko Plume to introduce Kayla Guerrero team per Dr Mariana Kaufman referral.  Per pt, she is bothered by leg pain, but feeling more upbeat because of tx adjustments to try to reduce side effects and because she is eagerly anticipating last xrt and chemo tx this month.  Pt states, "A light at the end of the tunnel sure helps." She plans to explore acupuncture to help with leg pain and to use her second [facility] massage to reduce stress/increase wellbeing.  Kayla Guerrero is aware of ongoing team availability for support, but please also page if immediate needs arise.  Thank you.   [place], [place], [facility] Pager [phone] Voicemail [phone]

## 2016-09-01 NOTE — Patient Instructions (Signed)

## 2016-09-02 ENCOUNTER — Encounter: Payer: Self-pay | Admitting: General Practice

## 2016-09-02 ENCOUNTER — Telehealth: Payer: Self-pay | Admitting: *Deleted

## 2016-09-02 NOTE — Progress Notes (Signed)
[place] Spiritual Care Note  Sent Kayla Guerrero a handwritten note of encouragement, enclosing my card and brochure, following up on our brief meeting after her appt with Dr Marko Plume yesterday.  Reminded her of ongoing Spiritual Care availability for support, processing, and reflection.   [place], [place], [facility] Pager [phone] Voicemail [phone]

## 2016-09-02 NOTE — Telephone Encounter (Signed)
CALLED PATIENT TO REMIND OF HDR TX. FOR 09-03-16 @ 10 AM, SPOKE WITH PATIENT AND SHE IS AWARE OF THIS [place].

## 2016-09-03 ENCOUNTER — Other Ambulatory Visit: Payer: Self-pay | Admitting: Oncology

## 2016-09-03 ENCOUNTER — Encounter: Payer: Self-pay | Admitting: Radiation Oncology

## 2016-09-03 ENCOUNTER — Telehealth: Payer: Self-pay | Admitting: Oncology

## 2016-09-03 ENCOUNTER — Ambulatory Visit
Admission: RE | Admit: 2016-09-03 | Discharge: 2016-09-03 | Disposition: A | Discharge status: Home / Self Care | Payer: Medicare Other | Source: Ambulatory Visit | Attending: Radiation Oncology | Admitting: Radiation Oncology

## 2016-09-03 DIAGNOSIS — C541 Malignant neoplasm of endometrium: Secondary | ICD-10-CM

## 2016-09-03 NOTE — Progress Notes (Signed)
  Radiation Oncology         [phone]) [phone] ________________________________  Name: Kayla Guerrero MRN: [id number]  Date: 09/03/2016  DOB: 1935-08-24  End of Treatment Note  Diagnosis:   Stage IB endometrioid and serous carcinoma of endometrium  Indication for treatment:  Post-op, reduce the risk of recurrence with adjuvant chemotherapy  Radiation treatment dates:   08/06/16, 08/13/16, 08/20/16, 08/27/16, 09/03/16  Site/dose:   The vaginal cuff was treated to 30 Gy in 5 fractions.  Beams/energy:   HDR-Vaginal Brachytherapy with Iridium-192 as the high dose rate soure. She was treated with a 3 cm diameter segmented cylinder with a treatment length of 3 cm.  Narrative: The patient tolerated radiation treatment relatively well. The patient's major complaint while receiving treatment is bilateral leg, hip, and lower back pain (likely from Granix). She also had fatigue and neuropathy in her feet and fingers.  Plan: The patient has completed radiation treatment. The patient will return to radiation oncology clinic for routine followup in one month. I advised them to call or return sooner if they have any questions or concerns related to their recovery or treatment.  -----------------------------------  Blair Promise, PhD, MD  This document serves as a record of services personally performed by Gery Pray, MD. It was created on his behalf by Darcus Austin, a trained medical scribe. The creation of this record is based on the scribe's personal observations and the provider's statements to them. This document has been checked and approved by the attending provider.

## 2016-09-03 NOTE — Telephone Encounter (Signed)
Medical Oncology  MD noticed now that chemo is incorrectly scheduled for 12-7, as last cycle was delayed a week to 11-24. LM on home phone and cell cancel chemo 12-7, date incorrect. MD has LM on home and cell #s for patient now, telling her no chemo 12-7 and that we would be in touch with her to give correct schedule. Spoke with infusion now re 0930 apt 12-7 cancelled.  Message sent high priority to schedulers now: Cancel chemo 12-7 cancel injection 12-11 and 12-14 Carbo only + lab from [facility] 12-14 granix 12-18 and 12-21 LL + lab from [facility] or peripheral 12-28  L.Marko Plume, MD

## 2016-09-03 NOTE — Progress Notes (Signed)
  Radiation Oncology         (336) [phone] ________________________________  Name: Kayla Guerrero MRN: [id number]  Date: 09/03/2016  DOB: March 16, 1935  CC: [name], MD  [name], MD  HDR BRACHYTHERAPY NOTE  DIAGNOSIS: Stage IB endometrioid and serous carcinoma of endometrium   Simple treatment device note: Patient had construction of her custom vaginal cylinder. She will be treated with a 3 cm diameter segmented cylinder. This conforms to her anatomy without undue discomfort.  Vaginal brachytherapy procedure node: The patient was brought to the [place] suite. Identity was confirmed. All relevant records and images related to the planned course of therapy were reviewed. The patient freely provided informed written consent to proceed with treatment after reviewing the details related to the planned course of therapy. The consent form was witnessed and verified by the simulation staff. Then, the patient was set-up in a stable reproducible supine position for radiation therapy. Pelvic exam revealed the cuff to be intact. The patient's custom vaginal cylinder was placed in the proximal vagina. This was affixed to the CT/MR stabilization plate to prevent slippage. Patient tolerated the placement well.  Verification simulation note:  A fiducial marker was placed within the vaginal cylinder. An AP and lateral film was then obtained through the pelvis area. This documented accurate position of the vaginal cylinder for treatment.  HDR BRACHYTHERAPY TREATMENT  The remote afterloading device was affixed to the vaginal cylinder by catheter. Patient then proceeded to undergo her fifth high-dose-rate treatment directed at the proximal vagina. The patient was prescribed a dose of 6 Gy gray to be delivered to the mucosal surface. Treatment length was 3 cm. Patient was treated with 1 channel using 7 dwell positions. Treatment time was 274.2 seconds. Iridium 192 was the high-dose-rate source for treatment.  The patient tolerated the treatment well. After completion of her therapy, a radiation survey was performed documenting return of the iridium source into the [employer] safe.   PLAN: The patient has completed HDR-brachytherapy and will return to radiation oncology in 1 month for a follow up. -----------------------------------  Blair Promise, PhD, MD  This document serves as a record of services personally performed by Gery Pray, MD. It was created on his behalf by Darcus Austin, a trained medical scribe. The creation of this record is based on the scribe's personal observations and the provider's statements to them. This document has been checked and approved by the attending provider.

## 2016-09-04 ENCOUNTER — Encounter: Payer: Self-pay | Admitting: Family Medicine

## 2016-09-04 ENCOUNTER — Other Ambulatory Visit: Payer: Medicare Other

## 2016-09-04 ENCOUNTER — Ambulatory Visit: Payer: Medicare Other

## 2016-09-05 ENCOUNTER — Ambulatory Visit: Payer: Medicare Other

## 2016-09-07 ENCOUNTER — Other Ambulatory Visit: Payer: Self-pay | Admitting: Oncology

## 2016-09-08 ENCOUNTER — Ambulatory Visit: Payer: Medicare Other

## 2016-09-10 ENCOUNTER — Other Ambulatory Visit: Payer: Self-pay | Admitting: Oncology

## 2016-09-11 ENCOUNTER — Other Ambulatory Visit (HOSPITAL_BASED_OUTPATIENT_CLINIC_OR_DEPARTMENT_OTHER): Payer: Medicare Other

## 2016-09-11 ENCOUNTER — Ambulatory Visit: Payer: Medicare Other

## 2016-09-11 ENCOUNTER — Ambulatory Visit (HOSPITAL_BASED_OUTPATIENT_CLINIC_OR_DEPARTMENT_OTHER): Payer: Medicare Other | Admitting: Oncology

## 2016-09-11 ENCOUNTER — Ambulatory Visit (HOSPITAL_BASED_OUTPATIENT_CLINIC_OR_DEPARTMENT_OTHER): Payer: Medicare Other

## 2016-09-11 VITALS — Wt 181.3 lb

## 2016-09-11 DIAGNOSIS — E039 Hypothyroidism, unspecified: Secondary | ICD-10-CM

## 2016-09-11 DIAGNOSIS — Z95828 Presence of other vascular implants and grafts: Secondary | ICD-10-CM

## 2016-09-11 DIAGNOSIS — M79604 Pain in right leg: Secondary | ICD-10-CM

## 2016-09-11 DIAGNOSIS — E860 Dehydration: Secondary | ICD-10-CM

## 2016-09-11 DIAGNOSIS — C541 Malignant neoplasm of endometrium: Secondary | ICD-10-CM

## 2016-09-11 DIAGNOSIS — T451X5A Adverse effect of antineoplastic and immunosuppressive drugs, initial encounter: Secondary | ICD-10-CM

## 2016-09-11 DIAGNOSIS — G620 Drug-induced polyneuropathy: Secondary | ICD-10-CM | POA: Diagnosis not present

## 2016-09-11 DIAGNOSIS — I951 Orthostatic hypotension: Secondary | ICD-10-CM

## 2016-09-11 DIAGNOSIS — M79605 Pain in left leg: Secondary | ICD-10-CM | POA: Diagnosis not present

## 2016-09-11 DIAGNOSIS — N289 Disorder of kidney and ureter, unspecified: Secondary | ICD-10-CM

## 2016-09-11 DIAGNOSIS — M792 Neuralgia and neuritis, unspecified: Secondary | ICD-10-CM

## 2016-09-11 DIAGNOSIS — D701 Agranulocytosis secondary to cancer chemotherapy: Secondary | ICD-10-CM

## 2016-09-11 DIAGNOSIS — D702 Other drug-induced agranulocytosis: Secondary | ICD-10-CM

## 2016-09-11 LAB — CBC WITH DIFFERENTIAL/PLATELET
BASO%: 0.4 % (ref 0.0–2.0)
Basophils Absolute: 0 10*3/uL (ref 0.0–0.1)
EOS%: 1.9 % (ref 0.0–7.0)
Eosinophils Absolute: 0.1 10*3/uL (ref 0.0–0.5)
HEMATOCRIT: 30.9 % — AB (ref 34.8–46.6)
HGB: 10.4 g/dL — ABNORMAL LOW (ref 11.6–15.9)
LYMPH#: 1.3 10*3/uL (ref 0.9–3.3)
LYMPH%: 50.8 % — ABNORMAL HIGH (ref 14.0–49.7)
MCH: 31 pg (ref 25.1–34.0)
MCHC: 33.7 g/dL (ref 31.5–36.0)
MCV: 92 fL (ref 79.5–101.0)
MONO#: 0.2 10*3/uL (ref 0.1–0.9)
MONO%: 8.4 % (ref 0.0–14.0)
NEUT%: 38.5 % (ref 38.4–76.8)
NEUTROS ABS: 1 10*3/uL — AB (ref 1.5–6.5)
PLATELETS: 177 10*3/uL (ref 145–400)
RBC: 3.36 10*6/uL — ABNORMAL LOW (ref 3.70–5.45)
RDW: 17.7 % — AB (ref 11.2–14.5)
WBC: 2.6 10*3/uL — AB (ref 3.9–10.3)

## 2016-09-11 LAB — COMPREHENSIVE METABOLIC PANEL
ALT: 11 U/L (ref 0–55)
AST: 19 U/L (ref 5–34)
Albumin: 3.1 g/dL — ABNORMAL LOW (ref 3.5–5.0)
Alkaline Phosphatase: 72 U/L (ref 40–150)
Anion Gap: 9 mEq/L (ref 3–11)
BUN: 13.4 mg/dL (ref 7.0–26.0)
CALCIUM: 8.8 mg/dL (ref 8.4–10.4)
CHLORIDE: 107 meq/L (ref 98–109)
CO2: 24 meq/L (ref 22–29)
CREATININE: 0.8 mg/dL (ref 0.6–1.1)
EGFR: 74 mL/min/{1.73_m2} — ABNORMAL LOW (ref >90)
Glucose: 117 mg/dl (ref 70–140)
Potassium: 3.6 mEq/L (ref 3.5–5.1)
Sodium: 140 mEq/L (ref 136–145)
TOTAL PROTEIN: 6.5 g/dL (ref 6.4–8.3)

## 2016-09-11 MED ORDER — SODIUM CHLORIDE 0.9% FLUSH
10.0000 mL | INTRAVENOUS | Status: DC | PRN
  Administered 2016-09-11: 10 mL via INTRAVENOUS
  Filled 2016-09-11: qty 10

## 2016-09-11 MED ORDER — SODIUM CHLORIDE 0.9 % IV SOLN
Freq: Once | INTRAVENOUS | Status: AC
  Administered 2016-09-11: 16:00:00 via INTRAVENOUS

## 2016-09-11 MED ORDER — HEPARIN SOD (PORK) LOCK FLUSH 100 UNIT/ML IV SOLN
500.0000 [IU] | Freq: Once | INTRAVENOUS | Status: AC
  Administered 2016-09-11: 500 [IU] via INTRAVENOUS
  Filled 2016-09-11: qty 5

## 2016-09-11 MED ORDER — TBO-FILGRASTIM 300 MCG/0.5ML ~~LOC~~ SOSY
300.0000 ug | PREFILLED_SYRINGE | Freq: Once | SUBCUTANEOUS | Status: AC
  Administered 2016-09-11: 300 ug via SUBCUTANEOUS
  Filled 2016-09-11: qty 0.5

## 2016-09-11 NOTE — Patient Instructions (Signed)
Dehydration, Adult Dehydration is a condition in which there is not enough fluid or water in the body. This happens when you lose more fluids than you take in. Important organs, such as the kidneys, brain, and heart, cannot function without a proper amount of fluids. Any loss of fluids from the body can lead to dehydration. Dehydration can range from mild to severe. This condition should be treated right away to prevent it from becoming severe. What are the causes? This condition may be caused by:  Vomiting.  Diarrhea.  Excessive sweating, such as from heat exposure or exercise.  Not drinking enough fluid, especially:  When ill.  While doing activity that requires a lot of energy.  Excessive urination.  Fever.  Infection.  Certain medicines, such as medicines that cause the body to lose excess fluid (diuretics).  Inability to access safe drinking water.  Reduced physical ability to get adequate water and food. What increases the risk? This condition is more likely to develop in people:  Who have a poorly controlled long-term (chronic) illness, such as diabetes, heart disease, or kidney disease.  Who are age 65 or older.  Who are disabled.  Who live in a place with high altitude.  Who play endurance sports. What are the signs or symptoms? Symptoms of mild dehydration may include:   Thirst.  Dry lips.  Slightly dry mouth.  Dry, warm skin.  Dizziness. Symptoms of moderate dehydration may include:   Very dry mouth.  Muscle cramps.  Dark urine. Urine may be the color of tea.  Decreased urine production.  Decreased tear production.  Heartbeat that is irregular or faster than normal (palpitations).  Headache.  Light-headedness, especially when you stand up from a sitting position.  Fainting (syncope). Symptoms of severe dehydration may include:   Changes in skin, such as:  Cold and clammy skin.  Blotchy (mottled) or pale skin.  Skin that does  not quickly return to normal after being lightly pinched and released (poor skin turgor).  Changes in body fluids, such as:  Extreme thirst.  No tear production.  Inability to sweat when body temperature is high, such as in hot weather.  Very little urine production.  Changes in vital signs, such as:  Weak pulse.  Pulse that is more than 100 beats a minute when sitting still.  Rapid breathing.  Low blood pressure.  Other changes, such as:  Sunken eyes.  Cold hands and feet.  Confusion.  Lack of energy (lethargy).  Difficulty waking up from sleep.  Short-term weight loss.  Unconsciousness. How is this diagnosed? This condition is diagnosed based on your symptoms and a physical exam. Blood and urine tests may be done to help confirm the diagnosis. How is this treated? Treatment for this condition depends on the severity. Mild or moderate dehydration can often be treated at home. Treatment should be started right away. Do not wait until dehydration becomes severe. Severe dehydration is an emergency and it needs to be treated in a hospital. Treatment for mild dehydration may include:   Drinking more fluids.  Replacing salts and minerals in your blood (electrolytes) that you may have lost. Treatment for moderate dehydration may include:   Drinking an oral rehydration solution (ORS). This is a drink that helps you replace fluids and electrolytes (rehydrate). It can be found at pharmacies and retail stores. Treatment for severe dehydration may include:   Receiving fluids through an IV tube.  Receiving an electrolyte solution through a feeding tube that is   passed through your nose and into your stomach (nasogastric tube, or NG tube).  Correcting any abnormalities in electrolytes.  Treating the underlying cause of dehydration. Follow these instructions at home:  If directed by your health care provider, drink an ORS:  Make an ORS by following instructions on the  package.  Start by drinking small amounts, about  cup (120 mL) every 5-10 minutes.  Slowly increase how much you drink until you have taken the amount recommended by your health care provider.  Drink enough clear fluid to keep your urine clear or pale yellow. If you were told to drink an ORS, finish the ORS first, then start slowly drinking other clear fluids. Drink fluids such as:  Water. Do not drink only water. Doing that can lead to having too little salt (sodium) in the body (hyponatremia).  Ice chips.  Fruit juice that you have added water to (diluted fruit juice).  Low-calorie sports drinks.  Avoid:  Alcohol.  Drinks that contain a lot of sugar. These include high-calorie sports drinks, fruit juice that is not diluted, and soda.  Caffeine.  Foods that are greasy or contain a lot of fat or sugar.  Take over-the-counter and prescription medicines only as told by your health care provider.  Do not take sodium tablets. This can lead to having too much sodium in the body (hypernatremia).  Eat foods that contain a healthy balance of electrolytes, such as bananas, oranges, potatoes, tomatoes, and spinach.  Keep all follow-up visits as told by your health care provider. This is important. Contact a health care provider if:  You have abdominal pain that:  Gets worse.  Stays in one area (localizes).  You have a rash.  You have a stiff neck.  You are more irritable than usual.  You are sleepier or more difficult to wake up than usual.  You feel weak or dizzy.  You feel very thirsty.  You have urinated only a small amount of very dark urine over 6-8 hours. Get help right away if:  You have symptoms of severe dehydration.  You cannot drink fluids without vomiting.  Your symptoms get worse with treatment.  You have a fever.  You have a severe headache.  You have vomiting or diarrhea that:  Gets worse.  Does not go away.  You have blood or green matter  (bile) in your vomit.  You have blood in your stool. This may cause stool to look black and tarry.  You have not urinated in 6-8 hours.  You faint.  Your heart rate while sitting still is over 100 beats a minute.  You have trouble breathing. This information is not intended to replace advice given to you by your health care provider. Make sure you discuss any questions you have with your health care provider. Document Released: 09/15/2005 Document Revised: 04/11/2016 Document Reviewed: 11/09/2015 Elsevier Interactive Patient Education  2017 [employer]  

## 2016-09-11 NOTE — Progress Notes (Signed)
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 Last chemo was single agent carboplatin for cycle 4 on 08-22-16, with granix on 11-25 and 11-27  (taxol held with peripheral neuropathy and orthostatic syncopal episodes). Cycle 5 planned today will be delayed a week as she is neutropenic today, ANC 1.0.  Patient complains of severe pain in legs bilaterally ongoing despite no taxol cycle 4 and last granix 08-25-16. She reports that pain begins during night after she is in bed, involves full extent of both legs, but seems especially severe in thighs, prevents her from sleeping any more than first few hours each night and lasts several hours into day following before resolving. She is not aware of low back pain. She generally gets up and walks during night "to distract myself" but cannot tell that this helps pain. She has tried percocet 5-325 one tablet at a time and aleve, either of which helps for "1-2 hours at most".  She has not tried muscle relaxant. She is usually able to sleep for a couple of hours in recliner chair prior to going to bed at night. She felt that water exercise on 09-10-16 resolved pain more quickly that AM. She has never had any interventions for degenerative arthritis in low back. She cannot associate the pain with HDR positioning.  Peripheral neuropathy in hands and feet is unchanged, tho she denies difficulty walking now, is still dropping things at times. She has had no fever or symptoms of infection, is fatigued as previously. She denies nausea or vomiting recently. Bowels are moving. She tolerated radiation without problems.  No bleeding. Bladder ok. No LE swelling. No SOB. NO problems with PAC. She continues to push po fluids and to wear support stockings. Remainder of 10 point Review of Systems negative  PAC placed by IR 08-11-16 Flu vaccine 06-23-16 No genetics testing  ONCOLOGIC HISTORY Patient presented to PCP with new vaginal discharge and some vaginal spotting, referred to Dr Dory Horn, whom she had known previously. D&C on 04-09-16 had high grade carcinoma favoring serous histology [phone]). She was referred to gyn oncology, saw Dr Denman George on 04-23-16. CT CAP 04-30-16 no evidence of metastatic disease, no pelvic adenopathy, no ascites, thickened endometrial canal. On 05-06-16 Dr Alycia Rossetti performed robotic hysterectomy BSO bilateral pelvic and paraaortic node evaluation (failed sentinel LN mapping); patient was DC home on 05-07-16. Pathology [phone]) had mixed endometrioid (80%) and serous (20%) carcinoma 4 cm size, invading 2.0 cm into myometrium 2.1 cm thick, + LVSI, Total 11 bilateral pelvic and paraaortic nodes negative, no washings. Post operative course was remarkable for acute onset left groin and thigh pain ~ 6 days post op, with repeat CT 05-16-16 without findings specific for those complaints; she was treated with neurontin and percocet, tapering now as symptoms have progressively improved. First carboplatin taxol given 06-11-16, neutropenic with ANC 0.9 on day 9 cycle 1, granix added. She had several syncopal episodes around cycle 3, ED evaluations then. Cycle 4 was delayed a week for leukopenia and given as carboplatin only, with gCSF and additional IVF. Cycle 5 was also delayed a week for neutropenia.  HDR x 5 to vaginal cuff, 30 Gy from 08-06-16 thru 09-03-16.  Objective:  Vital signs in last 24 hours:  Wt 181 lb 4.8 oz (82.2 kg)   BMI 29.26 kg/m  155/77, 101 regular, 18 not labored 97.6, 99%  Alert, oriented and appropriate, does not appear in acute discomfort now. Ambulatory without assistance,  able to get on and off exam table with some assistance. .  Alopecia  HEENT:PERRL, sclerae not icteric. Oral mucosa moist without lesions, posterior pharynx clear.  Neck supple. No JVD.  Lymphatics:no cervical,supraclavicular or inguinal adenopathy Resp: clear to auscultation bilaterally and normal percussion bilaterally Cardio: regular rate and rhythm. No gallop. GI: soft, nontender, not distended, no mass or organomegaly. Normally active bowel sounds. Surgical incision not remarkable. Musculoskeletal/ Extremities: LE without pitting edema, cords, tenderness. Low back not tender to palpation Neuro:  peripheral neuropathy hands and feet. Strength LE equal, intact. Gait not remarkable Skin without rash including low back and upper legs,  No ecchymosis, petechiae Portacath-without erythema or tenderness  Lab Results:  Results for orders placed or performed in visit on 09/11/16  CBC with Differential  Result Value Ref Range   WBC 2.6 (L) 3.9 - 10.3 10e3/uL   NEUT# 1.0 (L) 1.5 - 6.5 10e3/uL   HGB 10.4 (L) 11.6 - 15.9 g/dL   HCT 30.9 (L) 34.8 - 46.6 %   Platelets 177 145 - 400 10e3/uL   MCV 92.0 79.5 - 101.0 fL   MCH 31.0 25.1 - 34.0 pg   MCHC 33.7 31.5 - 36.0 g/dL   RBC 3.36 (L) 3.70 - 5.45 10e6/uL   RDW 17.7 (H) 11.2 - 14.5 %   lymph# 1.3 0.9 - 3.3 10e3/uL   MONO# 0.2 0.1 - 0.9 10e3/uL   Eosinophils Absolute 0.1 0.0 - 0.5 10e3/uL   Basophils Absolute 0.0 0.0 - 0.1 10e3/uL   NEUT% 38.5 38.4 - 76.8 %   LYMPH% 50.8 (H) 14.0 - 49.7 %   MONO% 8.4 0.0 - 14.0 %   EOS% 1.9 0.0 - 7.0 %   BASO% 0.4 0.0 - 2.0 %  Comprehensive metabolic panel  Result Value Ref Range   Sodium 140 136 - 145 mEq/L   Potassium 3.6 3.5 - 5.1 mEq/L   Chloride 107 98 - 109 mEq/L   CO2 24 22 - 29 mEq/L   Glucose 117 70 - 140 mg/dl   BUN 13.4 7.0 - 26.0 mg/dL   Creatinine 0.8 0.6 - 1.1 mg/dL   Total Bilirubin <0.22 0.20 - 1.20 mg/dL   Alkaline Phosphatase 72 40 - 150 U/L   AST 19 5 - 34 U/L   ALT 11 0 - 55  U/L   Total Protein 6.5 6.4 - 8.3 g/dL   Albumin 3.1 (L) 3.5 - 5.0 g/dL   Calcium 8.8 8.4 - 10.4 mg/dL   Anion Gap 9 3 - 11 mEq/L   EGFR 74 (L) >90 ml/min/1.73 m2     Studies/Results:  CTs AP 04-2016 note moderate degenerative changes in spine without evidence of bony metastatic disease, osteopenia and lumbosacral spondylosis   Medications: I have reviewed the patient's current medications. Granix today OK to try 2 percocet to see if helpful for leg pain at night. If not helpful would not continue. If percocet not helpful, can try  1/4 of robaxin available at home, tho patient and husband aware that this could make her quite drowsy or unsteady walking.  She does not need oral premed decadron for carboplatin. Granix 300 now.  DISCUSSION Not clear to me that bilateral leg pain is being caused  by chemo or gCSF, but does seem to occur regularly when in bed. Suggest sleeping in recliner tonight to see if other positioning might help; prn meds as above. Water exercise is a good idea as long as counts ok, will check CBC in AM after granix today as she hopes to go to water exercise again as soon as ok. She is known to Dr Ricki Rodriguez  Will move cycle 5 as carboplatin to next week. CBC and CMET prior. She will need granix x 3 after, within the week. As long as she is able to push po fluids and BP is staying in good range after chemo, will not need additional IVF following treatment.   Granix 300 now. 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 Adjuvant carboplatin taxol begun 06-11-16, cycle 4 delayed a week and given as [place] only.Vaginal brachytherapy completed by Dr Sondra Come. Will give cycle 5 also as carbo only, gCSF necessary tho does cause bone pain.  I will see her prior to cycle 6, decision then taxol for that treatment. 2.Syncopal episodes at home and at [facility]  followingcycle 3 chemo. Possibly dehydration and vasovagal.Home BP monitoring and Teds/ oral hydration continuing. Heldtaxol cycle 4, additional IVF.  3..chemo peripheral neuropathy feet and fingers: interfering somewhat with fine motor activities and noticeable with ambulation, may not be able to tolerate additional taxane. Cycle 5  carbo only, neuropathy no worse. 4.complaints of severe pain in legs every night within a few hours of going to bed. History is not typical for taxol/ gCSF. Note degenerative changes and scoliosis LS by prior imaging. Discussion and recommendations as above. 5.neuropathic pain left inguinal area postoperatively, no clear etiology on repeat CT AP then. Improved. Off neurontin. 6.chemo neutropenia: additional granix now, will need x 3 with cycle 5. 7.hypothyroid on replacement by PCP 8.post cholecystectomy and remote BTL, past GERD 9.Up to date mammograms and colonoscopy 10.Atherosclerotic thoracic and abdominal aorta by CT 11.intentional weight loss in past year, ~ 40 lbs, weight stable andbetter hydrated now. 12..Indeterminate right renal lesion on CT 13.flu vaccine 06-23-16  14.PAC in   All questions answered and patient/ husband know to call if concerns prior to next scheduled visit. Chemo and granix orders adjusted. Time spent 25 min including >50% counseling and coordination of care. Route PCP   Evlyn Clines, MD   09/11/2016, 5:32 PM

## 2016-09-12 ENCOUNTER — Telehealth: Payer: Self-pay

## 2016-09-12 ENCOUNTER — Ambulatory Visit: Payer: Medicare Other

## 2016-09-12 ENCOUNTER — Other Ambulatory Visit (HOSPITAL_BASED_OUTPATIENT_CLINIC_OR_DEPARTMENT_OTHER): Payer: Medicare Other

## 2016-09-12 DIAGNOSIS — C541 Malignant neoplasm of endometrium: Secondary | ICD-10-CM | POA: Diagnosis not present

## 2016-09-12 DIAGNOSIS — D702 Other drug-induced agranulocytosis: Secondary | ICD-10-CM

## 2016-09-12 LAB — CBC WITH DIFFERENTIAL/PLATELET
BASO%: 0.2 % (ref 0.0–2.0)
Basophils Absolute: 0 10*3/uL (ref 0.0–0.1)
EOS ABS: 0.1 10*3/uL (ref 0.0–0.5)
EOS%: 0.5 % (ref 0.0–7.0)
HCT: 31.8 % — ABNORMAL LOW (ref 34.8–46.6)
HGB: 10.7 g/dL — ABNORMAL LOW (ref 11.6–15.9)
LYMPH%: 13.6 % — AB (ref 14.0–49.7)
MCH: 31 pg (ref 25.1–34.0)
MCHC: 33.6 g/dL (ref 31.5–36.0)
MCV: 92.2 fL (ref 79.5–101.0)
MONO#: 0.6 10*3/uL (ref 0.1–0.9)
MONO%: 5.8 % (ref 0.0–14.0)
NEUT%: 79.9 % — ABNORMAL HIGH (ref 38.4–76.8)
NEUTROS ABS: 8.5 10*3/uL — AB (ref 1.5–6.5)
NRBC: 0 % (ref 0–0)
Platelets: 183 10*3/uL (ref 145–400)
RBC: 3.45 10*6/uL — AB (ref 3.70–5.45)
RDW: 18 % — AB (ref 11.2–14.5)
WBC: 10.6 10*3/uL — AB (ref 3.9–10.3)
lymph#: 1.4 10*3/uL (ref 0.9–3.3)

## 2016-09-12 NOTE — Telephone Encounter (Signed)
S/w Kayla Guerrero that Wed Thursday or Friday available for chemo next week. They prefer Thursday morning or Wednesday afternoon. Urgent POF sent per [facility].

## 2016-09-12 NOTE — Progress Notes (Signed)
[place]  Noted at 8.5 noted today. No injection needed at this per Dr. Marko Plume. No injection  Needed on 09/15/16 per Dr. Marko Plume.  Order.

## 2016-09-15 ENCOUNTER — Ambulatory Visit: Payer: Medicare Other

## 2016-09-16 ENCOUNTER — Other Ambulatory Visit: Payer: Self-pay | Admitting: Oncology

## 2016-09-16 ENCOUNTER — Encounter: Payer: Self-pay | Admitting: Oncology

## 2016-09-17 ENCOUNTER — Telehealth: Payer: Self-pay

## 2016-09-17 NOTE — Telephone Encounter (Signed)
Gave Kayla Guerrero the appointment times for tomorrow. Told her that she will only receive carboplatin tomorrow and granix injection will begin 09-19-16. She is to pick up appointment schedule tomorrow.

## 2016-09-17 NOTE — Telephone Encounter (Signed)
-----   Message from [name], MD sent at 09/16/2016  1:45 PM EST ----- Regarding: appointments Scheduling message sent now:  "lab and chemo carboplatin only correct for 12-21 cancel injection 12-21 add granix 12-22, 12-23, 12-28 cancel lab flush chemo 1-4  Please be sure patient knows chemo on 12-21 will be carbo only, no decadron premeds. Granix as above  Thank you!

## 2016-09-18 ENCOUNTER — Ambulatory Visit: Payer: Medicare Other

## 2016-09-18 ENCOUNTER — Other Ambulatory Visit (HOSPITAL_BASED_OUTPATIENT_CLINIC_OR_DEPARTMENT_OTHER): Payer: Medicare Other

## 2016-09-18 ENCOUNTER — Other Ambulatory Visit: Payer: Self-pay

## 2016-09-18 ENCOUNTER — Other Ambulatory Visit: Payer: Self-pay | Admitting: Oncology

## 2016-09-18 ENCOUNTER — Encounter: Payer: Self-pay | Admitting: Oncology

## 2016-09-18 ENCOUNTER — Ambulatory Visit (HOSPITAL_BASED_OUTPATIENT_CLINIC_OR_DEPARTMENT_OTHER): Payer: Medicare Other | Admitting: Oncology

## 2016-09-18 VITALS — BP 163/81 | HR 80 | Temp 97.3°F | Resp 18 | Ht 66.0 in | Wt 181.1 lb

## 2016-09-18 DIAGNOSIS — G8918 Other acute postprocedural pain: Secondary | ICD-10-CM

## 2016-09-18 DIAGNOSIS — G620 Drug-induced polyneuropathy: Secondary | ICD-10-CM | POA: Diagnosis not present

## 2016-09-18 DIAGNOSIS — M79604 Pain in right leg: Secondary | ICD-10-CM | POA: Diagnosis not present

## 2016-09-18 DIAGNOSIS — C541 Malignant neoplasm of endometrium: Secondary | ICD-10-CM

## 2016-09-18 DIAGNOSIS — E039 Hypothyroidism, unspecified: Secondary | ICD-10-CM

## 2016-09-18 DIAGNOSIS — D701 Agranulocytosis secondary to cancer chemotherapy: Secondary | ICD-10-CM

## 2016-09-18 DIAGNOSIS — M5440 Lumbago with sciatica, unspecified side: Secondary | ICD-10-CM

## 2016-09-18 DIAGNOSIS — T451X5A Adverse effect of antineoplastic and immunosuppressive drugs, initial encounter: Secondary | ICD-10-CM

## 2016-09-18 DIAGNOSIS — N289 Disorder of kidney and ureter, unspecified: Secondary | ICD-10-CM

## 2016-09-18 DIAGNOSIS — M792 Neuralgia and neuritis, unspecified: Secondary | ICD-10-CM

## 2016-09-18 DIAGNOSIS — Z95828 Presence of other vascular implants and grafts: Secondary | ICD-10-CM

## 2016-09-18 DIAGNOSIS — M79605 Pain in left leg: Secondary | ICD-10-CM

## 2016-09-18 LAB — CBC WITH DIFFERENTIAL/PLATELET
BASO%: 0.3 % (ref 0.0–2.0)
Basophils Absolute: 0 10*3/uL (ref 0.0–0.1)
EOS%: 2.1 % (ref 0.0–7.0)
Eosinophils Absolute: 0.1 10*3/uL (ref 0.0–0.5)
HEMATOCRIT: 34.3 % — AB (ref 34.8–46.6)
HGB: 11.4 g/dL — ABNORMAL LOW (ref 11.6–15.9)
LYMPH#: 1.1 10*3/uL (ref 0.9–3.3)
LYMPH%: 34.3 % (ref 14.0–49.7)
MCH: 31.1 pg (ref 25.1–34.0)
MCHC: 33.2 g/dL (ref 31.5–36.0)
MCV: 93.7 fL (ref 79.5–101.0)
MONO#: 0.3 10*3/uL (ref 0.1–0.9)
MONO%: 9.7 % (ref 0.0–14.0)
NEUT#: 1.8 10*3/uL (ref 1.5–6.5)
NEUT%: 53.6 % (ref 38.4–76.8)
Platelets: 211 10*3/uL (ref 145–400)
RBC: 3.66 10*6/uL — ABNORMAL LOW (ref 3.70–5.45)
RDW: 17.8 % — AB (ref 11.2–14.5)
WBC: 3.3 10*3/uL — AB (ref 3.9–10.3)

## 2016-09-18 LAB — COMPREHENSIVE METABOLIC PANEL
ALT: 10 U/L (ref 0–55)
AST: 20 U/L (ref 5–34)
Albumin: 3.4 g/dL — ABNORMAL LOW (ref 3.5–5.0)
Alkaline Phosphatase: 74 U/L (ref 40–150)
Anion Gap: 9 mEq/L (ref 3–11)
BUN: 12.3 mg/dL (ref 7.0–26.0)
CALCIUM: 8.7 mg/dL (ref 8.4–10.4)
CHLORIDE: 104 meq/L (ref 98–109)
CO2: 26 meq/L (ref 22–29)
CREATININE: 0.8 mg/dL (ref 0.6–1.1)
EGFR: 71 mL/min/{1.73_m2} — ABNORMAL LOW (ref >90)
Glucose: 62 mg/dl — ABNORMAL LOW (ref 70–140)
Potassium: 4 mEq/L (ref 3.5–5.1)
Sodium: 140 mEq/L (ref 136–145)
Total Bilirubin: 0.23 mg/dL (ref 0.20–1.20)
Total Protein: 6.7 g/dL (ref 6.4–8.3)

## 2016-09-18 MED ORDER — OXYCODONE-ACETAMINOPHEN 5-325 MG PO TABS: 1.0000 | ORAL_TABLET | ORAL | 0 refills | Status: DC | PRN

## 2016-09-18 NOTE — Telephone Encounter (Signed)
Dr Marko Plume requested rx for #20 rather than #45. New rx printed for signature. Old rx shredded.

## 2016-09-18 NOTE — Progress Notes (Signed)
Patient presented to Infusion Room c/o worsening neuropathy and fatigue. Also c/o persistent bilateral anterior leg pain and lower back pain without known injury. Notified Dr. Marko Plume of complaints. Advised to hold treatment today and send to her office for visit. Patient aware.

## 2016-09-18 NOTE — Progress Notes (Signed)
OFFICE PROGRESS NOTE   September 18, 2016   Physicians: Tamala Julian (PCP), Lequita Asal [place], F.Alucio, Monna Fam (ophth), Szott (dentist), Hiatt (podiatry)  INTERVAL HISTORY:   Patient is seen, together with husband, for work in visit today, due to multiple concerns when she came to infusion today for planned cycle 5 adjuvant chemotherapy for IB serous carcinoma. Due to peripheral neuropathy and very poor tolerance, chemo today was planned as carboplatin only. To see Dr Sondra Come 10-09-16  When patient was seen last by MD on Dec 14, she reported severe leg pain thru nights and into AMs, despite holding taxol also cycle 4 and no granix since 08-25-16. She tells me now that recommendations from that visit were not helpful, including sleeping in recliner instead of bed, one dose of robaxin (took full tablet instead of partial dose as discussed, but fortunately no problems from that). Has taken oxycodone 10 mg "makes me go to sleep" so apparently does improve discomfort somewhat. She now feels pain in low sacral area as well as bilateral LE, thighs into lower legs. She feels generally weak, is tired from lack of sleep, tho she says she slept a little better Thurs, Sat, Tues. Walking does not increase or improve the discomfort. Bowels and bladder are ok. No fever. No HA. No falls or injury recently. Otherwise appetite ok, no nausea. Peripheral neuropathy feet same, and in hands somewhat interfering with fine motor skills, now taking extra magnesium (per phone conversation with Dr Sonny Dandy, who knows Mr Eddinger). Drinking fluids well. No orthostatic symptoms. Wearing support hose.  Remainder of 10 point Review of Systems negative.  NOTE she had severe inguinal neuropathy post op.    PAC placed by IR 08-11-16 Flu vaccine 06-23-16 No genetics testing  They have decided to cancel cruise in Jan, will bring papers for me to complete at next visit.  ONCOLOGIC  HISTORY  Patient presented to PCP with new vaginal discharge and some vaginal spotting, referred to Dr Dory Horn, whom she had known previously. D&C on 04-09-16 had high grade carcinoma favoring serous histology [phone]). She was referred to gyn oncology, saw Dr Denman George on 04-23-16. CT CAP 04-30-16 no evidence of metastatic disease, no pelvic adenopathy, no ascites, thickened endometrial canal. On 05-06-16 Dr Alycia Rossetti performed robotic hysterectomy BSO bilateral pelvic and paraaortic node evaluation (failed sentinel LN mapping); patient was DC home on 05-07-16. 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 Post operative course was remarkable for acute onset left groin and thigh pain ~ 6 days post op, with repeat CT 05-16-16 without findings specific for those complaints; she was treated with neurontin and percocet, tapering now as symptoms have progressively improved. First carboplatin taxol given 06-11-16, neutropenic with ANC 0.9 on day 9 cycle 1, granix added. She had several syncopal episodes around cycle 3, ED evaluations then. Cycle 4 was delayed a week for leukopenia and given as carboplatin only, with gCSF and additional IVF. Cycle 5 was delayed a week for neutropenia, then delayed for low back/ LE pain. HDR x 5 to vaginal cuff, 30 Gy from 08-06-16 thru 09-03-16.    Objective:  Vital signs in last 24 hours:  BP (!) 163/81 (BP Location: Left Arm, Patient Position: Sitting) Comment: Made nurse aware of pt BP  Pulse 80   Temp 97.3 F (36.3 C) (  Oral)   Resp 18   Ht 5\' 6"  (1.676 m)   Wt 181 lb 1.6 oz (82.1 kg)   SpO2 99%   BMI 29.23 kg/m  Weight stable. Alert, oriented, cooperative, not an easy historian, tearful during visit. Ambulatory without assistance.  Alopecia  HEENT:PERRL, sclerae not icteric. Oral mucosa moist without  lesions, posterior pharynx clear.  Neck supple. No JVD.  Lymphatics:no supraclavicular or inguinal adenopathy Resp: clear to auscultation bilaterally and normal percussion bilaterally Cardio: regular rate and rhythm. No gallop. GI: soft, nontender, not distended, no mass or organomegaly. Normally active bowel sounds. Surgical incisions not remarkable. Musculoskeletal/ Extremities: Not tender to palpation spine to sacrum. LE without pitting edema, cords, tenderness Neuro: peripheral neuropathy fingers and distal feet. Otherwise nonfocal. PSYCH appropriate mood and affect Skin without rash including low spine, no ecchymosis, petechiae Portacath-without erythema or tenderness  Lab Results:  Results for orders placed or performed in visit on 09/18/16  CBC with Differential  Result Value Ref Range   WBC 3.3 (L) 3.9 - 10.3 10e3/uL   NEUT# 1.8 1.5 - 6.5 10e3/uL   HGB 11.4 (L) 11.6 - 15.9 g/dL   HCT 09/20/16 (L) 73.9 - 10.6 %   Platelets 211 145 - 400 10e3/uL   MCV 93.7 79.5 - 101.0 fL   MCH 31.1 25.1 - 34.0 pg   MCHC 33.2 31.5 - 36.0 g/dL   RBC 30.7 (L) 3.16 - 4.46 10e6/uL   RDW 17.8 (H) 11.2 - 14.5 %   lymph# 1.1 0.9 - 3.3 10e3/uL   MONO# 0.3 0.1 - 0.9 10e3/uL   Eosinophils Absolute 0.1 0.0 - 0.5 10e3/uL   Basophils Absolute 0.0 0.0 - 0.1 10e3/uL   NEUT% 53.6 38.4 - 76.8 %   LYMPH% 34.3 14.0 - 49.7 %   MONO% 9.7 0.0 - 14.0 %   EOS% 2.1 0.0 - 7.0 %   BASO% 0.3 0.0 - 2.0 %  Comprehensive metabolic panel  Result Value Ref Range   Sodium 140 136 - 145 mEq/L   Potassium 4.0 3.5 - 5.1 mEq/L   Chloride 104 98 - 109 mEq/L   CO2 26 22 - 29 mEq/L   Glucose 62 (L) 70 - 140 mg/dl   BUN 2.45 7.0 - 00.6 mg/dL   Creatinine 0.8 0.6 - 1.1 mg/dL   Total Bilirubin 95.6 0.20 - 1.20 mg/dL   Alkaline Phosphatase 74 40 - 150 U/L   AST 20 5 - 34 U/L   ALT 10 0 - 55 U/L   Total Protein 6.7 6.4 - 8.3 g/dL   Albumin 3.4 (L) 3.5 - 5.0 g/dL   Calcium 8.7 8.4 - 6.52 mg/dL   Anion Gap 9 3 - 11 mEq/L    EGFR 71 (L) >90 ml/min/1.73 m2     Studies/Results: Reviewed previous imaging: CT AP 04-30-16 moderate degenerative changes in spine with bony mets. CT AP 05-16-16  LS spondylosis, some presacral edema thought post op (surgery 05-06-16).    Medications: I have reviewed the patient's current medications. Percocet rewritten #20 now  DISCUSSION Very poor tolerance of adjuvant chemo in 81 yo lady with IB serous carcinoma of endometrium. It is not clear to me that present complaints of LS and LE pain are neuropathic related to taxol or platinum, but cannot give chemo today with her degree of distress. First available nonemergent outpatient CT AP is 09-23-16.   Note she had several falls ~ early Nov, however this pain not temporally associated. She had vaginal brachytherapy 11-8  thru 09-03-16, however patient cannot relate this pain to those treatments. I have spoken directly to Dr Alusio's office, information sent to Dr Tonita Cong, with return message from that office that Dr Tonita Cong will see her following CT.   Hold chemo today No granix 12-22 or 12-23 Careful pain medication if necessary. Water exercise seems helpful and can do that now.  I will see her again 12-28, will try to continue treatment from there if possible.    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 Adjuvant carboplatin taxol begun 06-11-16, cycle 4 delayed a week and given as [place] only.Vaginal brachytherapy completed by Dr Sondra Come 09-04-16. Will give cycle 5 also as carbo only, gCSF necessary tho does cause bone pain. Hold cycle 5 again today as patient unable to tolerate with other problems today. I will see her 09-25-16, hopefully can resume treatment then. If peripheral neuropathy still significant in fingers will not give taxol with #6 2.Syncopal episodes with falls at home and at [facility] followingcycle 3 chemo. Possibly dehydration and  vasovagal.Home BP monitoring and Teds/ oral hydration continuing. Heldtaxol cycle 4, additional IVF.  [email or web address]-[email or web address] of severe pain in legs every night within a few hours of going to bed, now also low sacral area, distressing to patient.  History is not typical for taxol/ gCSF pain. Note degenerative changes and scoliosis LS by prior imaging, and presacral edema post op. Will get next available CT AP 09-23-16. Orthopedics to see after that scan.  4.. Severe neuropathic pain left inguinal area postoperatively, no clear etiology on repeat CT AP then. Improved. Off neurontin. 5.chemo peripheral neuropathy feet and fingers: interfering somewhat with fine motor activities and noticeable with ambulation, may not be able to tolerate additional taxane. Cycle 5  carbo only, neuropathy no worse. 6.chemo neutropenia:will need granix x 3 with cycle 5. 7.hypothyroid on replacement by PCP 8.post cholecystectomy and remote BTL, past GERD 9.Up to date mammograms and colonoscopy 10.Atherosclerotic thoracic and abdominal aorta by CT 11.intentional weight loss in past year, ~ 40 lbs, weight stable andbetter hydrated now. 12..Indeterminate right renal lesion on CT 13.flu vaccine 06-23-16  14.PAC in   All questions answered and patient / husband agree with plans for CT and ortho referral. Chemo orders adjusted. Time spent 30 min including >50% counseling and coordination of care. Cc Dr Susa Day, Dr Tomi Bamberger, Dr Sondra Come, Dr Alycia Rossetti   Evlyn Clines, MD   09/18/2016, 6:11 PM

## 2016-09-19 ENCOUNTER — Ambulatory Visit: Payer: Medicare Other

## 2016-09-20 ENCOUNTER — Ambulatory Visit: Payer: Medicare Other

## 2016-09-23 ENCOUNTER — Encounter (HOSPITAL_COMMUNITY): Payer: Self-pay

## 2016-09-23 ENCOUNTER — Encounter: Payer: Self-pay | Admitting: Oncology

## 2016-09-23 ENCOUNTER — Ambulatory Visit (HOSPITAL_COMMUNITY)
Admission: RE | Admit: 2016-09-23 | Discharge: 2016-09-23 | Disposition: A | Discharge status: Home / Self Care | Payer: Medicare Other | Source: Ambulatory Visit | Attending: Oncology | Admitting: Oncology

## 2016-09-23 DIAGNOSIS — M5440 Lumbago with sciatica, unspecified side: Secondary | ICD-10-CM | POA: Diagnosis present

## 2016-09-23 DIAGNOSIS — I700 Atherosclerosis of aorta: Secondary | ICD-10-CM | POA: Diagnosis not present

## 2016-09-23 DIAGNOSIS — M5136 Other intervertebral disc degeneration, lumbar region: Secondary | ICD-10-CM | POA: Insufficient documentation

## 2016-09-23 DIAGNOSIS — K429 Umbilical hernia without obstruction or gangrene: Secondary | ICD-10-CM | POA: Insufficient documentation

## 2016-09-23 DIAGNOSIS — C541 Malignant neoplasm of endometrium: Secondary | ICD-10-CM | POA: Diagnosis present

## 2016-09-23 DIAGNOSIS — K5730 Diverticulosis of large intestine without perforation or abscess without bleeding: Secondary | ICD-10-CM | POA: Insufficient documentation

## 2016-09-23 DIAGNOSIS — R935 Abnormal findings on diagnostic imaging of other abdominal regions, including retroperitoneum: Secondary | ICD-10-CM | POA: Diagnosis not present

## 2016-09-23 DIAGNOSIS — M4316 Spondylolisthesis, lumbar region: Secondary | ICD-10-CM | POA: Diagnosis not present

## 2016-09-23 DIAGNOSIS — K449 Diaphragmatic hernia without obstruction or gangrene: Secondary | ICD-10-CM | POA: Insufficient documentation

## 2016-09-23 MED ORDER — HEPARIN SOD (PORK) LOCK FLUSH 100 UNIT/ML IV SOLN
INTRAVENOUS | Status: AC
  Filled 2016-09-23: qty 5

## 2016-09-23 MED ORDER — IOPAMIDOL (ISOVUE-300) INJECTION 61%
INTRAVENOUS | Status: AC
  Filled 2016-09-23: qty 100

## 2016-09-23 MED ORDER — IOPAMIDOL (ISOVUE-300) INJECTION 61%
100.0000 mL | Freq: Once | INTRAVENOUS | Status: AC | PRN
  Administered 2016-09-23: 100 mL via INTRAVENOUS

## 2016-09-23 NOTE — Progress Notes (Signed)
Medical Oncology  CT information routed to gyn oncology and to Dr Tonita Cong, as he had offered to see her after that scan done.   Godfrey Pick, MD

## 2016-09-24 ENCOUNTER — Other Ambulatory Visit: Payer: Self-pay | Admitting: Oncology

## 2016-09-25 ENCOUNTER — Ambulatory Visit: Payer: Medicare Other

## 2016-09-25 ENCOUNTER — Other Ambulatory Visit (HOSPITAL_BASED_OUTPATIENT_CLINIC_OR_DEPARTMENT_OTHER): Payer: Medicare Other

## 2016-09-25 ENCOUNTER — Ambulatory Visit (HOSPITAL_BASED_OUTPATIENT_CLINIC_OR_DEPARTMENT_OTHER): Payer: Medicare Other | Admitting: Oncology

## 2016-09-25 VITALS — BP 164/94 | HR 78 | Temp 98.2°F | Resp 18 | Ht 66.0 in | Wt 180.2 lb

## 2016-09-25 DIAGNOSIS — M79605 Pain in left leg: Secondary | ICD-10-CM

## 2016-09-25 DIAGNOSIS — T451X5A Adverse effect of antineoplastic and immunosuppressive drugs, initial encounter: Secondary | ICD-10-CM

## 2016-09-25 DIAGNOSIS — D701 Agranulocytosis secondary to cancer chemotherapy: Secondary | ICD-10-CM

## 2016-09-25 DIAGNOSIS — I951 Orthostatic hypotension: Secondary | ICD-10-CM

## 2016-09-25 DIAGNOSIS — Z95828 Presence of other vascular implants and grafts: Secondary | ICD-10-CM

## 2016-09-25 DIAGNOSIS — D72819 Decreased white blood cell count, unspecified: Secondary | ICD-10-CM | POA: Diagnosis not present

## 2016-09-25 DIAGNOSIS — R188 Other ascites: Secondary | ICD-10-CM

## 2016-09-25 DIAGNOSIS — M79604 Pain in right leg: Secondary | ICD-10-CM

## 2016-09-25 DIAGNOSIS — C541 Malignant neoplasm of endometrium: Secondary | ICD-10-CM

## 2016-09-25 DIAGNOSIS — M47895 Other spondylosis, thoracolumbar region: Secondary | ICD-10-CM | POA: Diagnosis not present

## 2016-09-25 DIAGNOSIS — G620 Drug-induced polyneuropathy: Secondary | ICD-10-CM

## 2016-09-25 DIAGNOSIS — N289 Disorder of kidney and ureter, unspecified: Secondary | ICD-10-CM

## 2016-09-25 DIAGNOSIS — M898X9 Other specified disorders of bone, unspecified site: Secondary | ICD-10-CM

## 2016-09-25 LAB — COMPREHENSIVE METABOLIC PANEL
ALK PHOS: 69 U/L (ref 40–150)
ALT: 9 U/L (ref 0–55)
ANION GAP: 10 meq/L (ref 3–11)
AST: 20 U/L (ref 5–34)
Albumin: 3.4 g/dL — ABNORMAL LOW (ref 3.5–5.0)
BILIRUBIN TOTAL: 0.29 mg/dL (ref 0.20–1.20)
BUN: 12.9 mg/dL (ref 7.0–26.0)
CALCIUM: 8.8 mg/dL (ref 8.4–10.4)
CO2: 25 meq/L (ref 22–29)
Chloride: 104 mEq/L (ref 98–109)
Creatinine: 0.8 mg/dL (ref 0.6–1.1)
EGFR: 75 mL/min/{1.73_m2} — AB (ref >90)
Glucose: 99 mg/dl (ref 70–140)
Potassium: 4 mEq/L (ref 3.5–5.1)
Sodium: 139 mEq/L (ref 136–145)
Total Protein: 6.4 g/dL (ref 6.4–8.3)

## 2016-09-25 LAB — CBC WITH DIFFERENTIAL/PLATELET
BASO%: 0.4 % (ref 0.0–2.0)
Basophils Absolute: 0 10*3/uL (ref 0.0–0.1)
EOS ABS: 0.1 10*3/uL (ref 0.0–0.5)
EOS%: 1.9 % (ref 0.0–7.0)
HCT: 31.7 % — ABNORMAL LOW (ref 34.8–46.6)
HGB: 10.7 g/dL — ABNORMAL LOW (ref 11.6–15.9)
LYMPH%: 44.3 % (ref 14.0–49.7)
MCH: 31.6 pg (ref 25.1–34.0)
MCHC: 33.8 g/dL (ref 31.5–36.0)
MCV: 93.5 fL (ref 79.5–101.0)
MONO#: 0.2 10*3/uL (ref 0.1–0.9)
MONO%: 6.1 % (ref 0.0–14.0)
NEUT%: 47.3 % (ref 38.4–76.8)
NEUTROS ABS: 1.2 10*3/uL — AB (ref 1.5–6.5)
PLATELETS: 249 10*3/uL (ref 145–400)
RBC: 3.39 10*6/uL — AB (ref 3.70–5.45)
RDW: 16.6 % — ABNORMAL HIGH (ref 11.2–14.5)
WBC: 2.6 10*3/uL — AB (ref 3.9–10.3)
lymph#: 1.2 10*3/uL (ref 0.9–3.3)

## 2016-09-25 MED ORDER — SODIUM CHLORIDE 0.9% FLUSH
10.0000 mL | INTRAVENOUS | Status: DC | PRN
  Administered 2016-09-25: 10 mL via INTRAVENOUS
  Filled 2016-09-25: qty 10

## 2016-09-25 MED ORDER — HEPARIN SOD (PORK) LOCK FLUSH 100 UNIT/ML IV SOLN
500.0000 [IU] | Freq: Once | INTRAVENOUS | Status: AC
  Administered 2016-09-25: 500 [IU] via INTRAVENOUS
  Filled 2016-09-25: qty 5

## 2016-09-25 NOTE — Progress Notes (Signed)
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 Last chemo was cycle 4 as carboplatin only on 08-22-16, held since then with neuropathic type pain and cytopenias. Plan was to treat with cycle 5 as carboplatin only today, however ANC now is only 1.2 She has follow up with Dr Sondra Come 10-09-16 She will need return apt to gyn oncology after chemo completes; Dr Alycia Rossetti has been involved since surgery, but also known to Dr Denman George. Expect will have repeat imaging per IR if pelvic fluid collection is drained.    Because of progressive severe pain low back and bilateral thighs, patient had CT AP 09-23-16, which shows increased size of apparent right pelvic sidewall lymphocyst and moderate to severe thoracolumbar spondylosis especially prominent at L5-S1, with facet arthropathy bilaterally in lower LS. There is no obvious recurrent or persistent gyn malignancy.  I have been in communication with Dr Alycia Rossetti re CT findings prior to this visit, recommendation for drainage of the apparent pelvic lymphocyst by IR, with cytology and cultures.  Per Dr Clayton Bibles office, he kindly agreed to see her after this CT, set up for Jan 5.   Pain in low back to bilateral thighs/ LE has been a little less intense in recent nights, always worst when lies down, tho she also did not seem to get improvement when she tried to sleep in recliner.  She does not recall that she has used Percocet in last few days; percocet has " let her sleep", gabapentin, robaxin and naproxen not helpful. Water exercise classes seem helpful. Otherwise she cannot tell that white count is low today, as energy is ok.  She did not take decadron last pm as only [place] planned today. Appetite is good, taste mostly ok, no N/V. Bowels ok. No abdominal or pelvic pain. No syncopal episodes or falls since she has been diligent with po hydration, support hose etc. No fever or symptoms of infection. No problems with PAC. No bleeding. No swelling LE. No rash. No respiratory complaints. Remainder of 10 point Review of Systems negative.     PAC placed by IR, flushed 09-25-16 Flu vaccine 06-23-16 No genetics testing  ONCOLOGIC HISTORY Patient presented to PCP with new vaginal discharge and some vaginal spotting, referred to Dr Dory Horn. [facility] on 04-09-16  high grade carcinoma favoring serous histology [phone]). She saw Dr Denman George  04-23-16. CT CAP 04-30-16 no evidence of metastatic disease, no pelvic adenopathy, no ascites, thickened endometrial canal. On 05-06-16 Dr Alycia Rossetti performed robotic hysterectomy BSO bilateral pelvic and paraaortic node evaluation (failed sentinel LN mapping). 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 Post operative course was remarkable for acute onset left groin and thigh pain ~ 6 days post op, with repeat CT 05-16-16 without findings specific for those complaints, apparent right pelvic sidewall lymphocyst; she was treated with neurontin and percocet, tapered as bilateral groin pain progressively improved. First carboplatin taxol given 06-11-16, neutropenic with ANC 0.9 on day 9 cycle 1, granix added. She had several syncopal episodes around cycle 3, ED evaluations then, appeared to be orthostatic hypotension with dehydration.  Cycle 4 was delayed a week for leukopenia and given as carboplatin only, with gCSF and additional IVF. Cycle 5 was delayed for neutropenia, then for severe low back/ LE pain, and delayed again for low counts. HDR x 5 to  vaginal cuff, 30 Gy from11-8-17 thru 09-03-16. CT AP 09-23-16 increase in size of apparent right pelvic sidewall lymphocyst and degenerative changes in spine, without obvious residual or recurrent malignancy.  Objective:  Vital signs in last 24 hours:  BP (!) 164/94 (BP Location: Left Arm, Patient Position: Sitting) Comment: informed Cathy  Pulse 78   Temp 98.2 F (36.8 C) (Oral)   Resp 18   Ht _0  (1.676 m)   Wt 180 lb 3.2 oz (81.7 kg)   SpO2 99%   BMI 29.09 kg/m  Weight down 1 lb Alert, oriented and appropriate, not in acute discomfort now. Ambulatory without assistance. Alopecia  HEENT:PERRL, sclerae not icteric. Oral mucosa moist without lesions, posterior pharynx clear.  Neck supple. No JVD.  Lymphatics:no supraclavicular or inguinal adenopathy Resp: clear to auscultation bilaterally and normal percussion bilaterally Cardio: regular rate and rhythm. No gallop. GI: soft, nontender including RLQ, not distended, no mass or organomegaly. Normally active bowel sounds. Surgical incisions not remarkable. Musculoskeletal/ Extremities: LE without pitting edema, cords, tenderness Neuro: no increase in peripheral neuropathy distal fingers and toes consistent with taxol. Strength in LE equal. PSYCH appropriate mood and affect. Skin without rash, ecchymosis, petechiae Portacath-without erythema or tenderness  Lab Results:  Results for orders placed or performed in visit on 09/25/16  CBC with Differential  Result Value Ref Range   WBC 2.6 (L) 3.9 - 10.3 10e3/uL   NEUT# 1.2 (L) 1.5 - 6.5 10e3/uL   HGB 10.7 (L) 11.6 - 15.9 g/dL   HCT 31.7 (L) 34.8 - 46.6 %   Platelets 249 145 - 400 10e3/uL   MCV 93.5 79.5 - 101.0 fL   MCH 31.6 25.1 - 34.0 pg   MCHC 33.8 31.5 - 36.0 g/dL   RBC 3.39 (L) 3.70 - 5.45 10e6/uL   RDW 16.6 (H) 11.2 - 14.5 %   lymph# 1.2 0.9 - 3.3 10e3/uL   MONO# 0.2 0.1 - 0.9 10e3/uL   Eosinophils Absolute 0.1 0.0 - 0.5 10e3/uL   Basophils Absolute 0.0 0.0 - 0.1  10e3/uL   NEUT% 47.3 38.4 - 76.8 %   LYMPH% 44.3 14.0 - 49.7 %   MONO% 6.1 0.0 - 14.0 %   EOS% 1.9 0.0 - 7.0 %   BASO% 0.4 0.0 - 2.0 %  Comprehensive metabolic panel  Result Value Ref Range   Sodium 139 136 - 145 mEq/L   Potassium 4.0 3.5 - 5.1 mEq/L   Chloride 104 98 - 109 mEq/L   CO2 25 22 - 29 mEq/L   Glucose 99 70 - 140 mg/dl   BUN 12.9 7.0 - 26.0 mg/dL   Creatinine 0.8 0.6 - 1.1 mg/dL   Total Bilirubin 0.29 0.20 - 1.20 mg/dL   Alkaline Phosphatase 69 40 - 150 U/L   AST 20 5 - 34 U/L   ALT 9 0 - 55 U/L   Total Protein 6.4 6.4 - 8.3 g/dL   Albumin 3.4 (L) 3.5 - 5.0 g/dL   Calcium 8.8 8.4 - 10.4 mg/dL   Anion Gap 10 3 - 11 mEq/L   EGFR 75 (L) >90 ml/min/1.73 m2     Studies/Results: EXAM: CT ABDOMEN AND PELVIS WITH CONTRAST 09-23-16  COMPARISON:  05/16/2016 CT abdomen/ pelvis.  FINDINGS: Lower  chest: No significant pulmonary nodules or acute consolidative airspace disease.  Hepatobiliary: Normal liver size. There are 4 scattered subcentimeter hypodense liver lesions, too small to characterize, not appreciably changed back to 05/16/2016, suggesting benign lesions. No new liver lesions. Cholecystectomy. Stable bile ducts with mild central intrahepatic biliary ductal dilatation and common bile duct diameter 8 mm, within expected post cholecystectomy limits. No radiopaque choledocholithiasis.  Pancreas: Normal, with no mass or duct dilation.  Spleen: Normal size. No mass.  Adrenals/Urinary Tract: Normal adrenals. Indeterminate hypodense 1.2 cm renal cortical lesion in the posterior interpolar right kidney (series 7/ image 23) is stable in size since 05/16/2016. Additional subcentimeter hypodense renal cortical lesions in both kidneys are too small to characterize and stable, suggesting benign renal cysts. No hydronephrosis. Relatively collapsed and grossly normal bladder, noting extrinsic mass effect on the right bladder wall by the right pelvic sidewall  collection.  Stomach/Bowel: Moderate hiatal hernia. Otherwise relatively collapsed and grossly normal stomach. Normal caliber small bowel with no small bowel wall thickening. Normal appendix. Moderate sigmoid diverticulosis, with no large bowel wall thickening or pericolonic fat stranding. Oral contrast progresses to the hepatic flexure of the colon.  Vascular/Lymphatic: Atherosclerotic nonaneurysmal abdominal aorta. Patent portal, splenic, hepatic and renal veins. No pathologically enlarged lymph nodes in the abdomen or pelvis.  Reproductive: Status post hysterectomy. No masses at the vaginal cuff. Right pelvic sidewall thin-walled lobulated fluid collection measuring 6.9 x 5.8 x 5.8 cm (series 2/ image 74), increased from 4.1 x 2.9 x 3.7 cm on 05/16/2016 CT study. Otherwise no adnexal masses. No peritoneal nodularity.  Other: No pneumoperitoneum. No ascites. Stable tiny fat containing umbilical hernia.  Musculoskeletal: No aggressive appearing focal osseous lesions. Moderate to severe thoracolumbar spondylosis, most prominent at L5-S1. Prominent facet arthropathy bilaterally in the lower lumbar spine. Stable 4 mm anterolisthesis at L4-5. Stable 3 mm retrolisthesis at L5-S1.  IMPRESSION: 1. No findings suspicious for metastatic disease in the abdomen or pelvis. 2. Thin-walled lobulated 6.9 x 5.8 x 5.8 cm right pelvic sidewall fluid collection, increased in size since 05/16/2016 CT study, favor a postoperative lymphocele, which demonstrates extrinsic mass-effect on the right bladder wall. 3. Indeterminate 1.2 cm posterior interpolar right renal cortical lesion, for which 4 month stability has been demonstrated, renal neoplasm not excluded. Recommend either dedicated renal protocol MRI or CT abdomen without and with IV contrast or continued attention on follow-up surveillance CT studies, as clinically warranted. 4. Additional findings include aortic atherosclerosis,  moderate hiatal hernia, moderate sigmoid diverticulosis, tiny fat containing umbilical hernia, and degenerative disc disease, facet arthropathy and spondylolisthesis in the lower lumbar spine.  PACs images reviewed with patient and husband now Report sent to Dr Tonita Cong.  Medications: I have reviewed the patient's current medications. She has some percocet available, not refilled now.  DISCUSSION CT information as above, fortunately nothing that is obviously malignancy. Discussed IR referral for drainage of apparent lymphocyst, mentioned sending for culture and cytology; patient is in agreement with this referral, order placed now and I have spoken directly to [facility] IR scheduling.  Encouraged her to keep appointment with Dr Tonita Cong as planned 10-03-16, as it seems likely that she is also symptomatic from degenerative arthritis in low back. Fine to continue water aerobics, as this has improved symptoms somewhat.  I willl see her with labs in ~ 10 days, treat  if counts ok with carboplatin only as last planned chemo.   The [place] have cancelled their cruise planned for Jan, as she is not medically  stable to make that trip. I have completed papers today for the cancellation , given to Mr Ailey.  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 Adjuvant carboplatin taxol begun 06-11-16, several delays due to counts, syncopal episodes and severe LE pain. Cycle 5 delayed again today with leukopenia, will try to give one more treatment on Jan 12 if counts ok and otherwise stable at my visit Jan 11, expect this to be [place] only, will need granix. 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 Have requested  drainage by IR, send for cytology and culture, Dr Alycia Rossetti aware 3.[place] lumbar spondylosis especially L5-S1 and facet arthropathy, by CT AP 09-23-16:  likely related to severe pain in legs and low sacral area occurring at night within a few hours of going to bed, which has been distressing to patient.  Pattern is not typical for taxol/ gCSF pain.  She will see Dr Tonita Cong on 10-03-16, thank you. 4..Syncopal episodes with falls followingcycle 3 chemo. Possibly dehydration and vasovagal.Home BP monitoring and Teds/ oral hydration continuing. Heldtaxol cycle 4, additional IVF. BP not low today and appears well hydrated. 5.chemo peripheral neuropathy feet and fingers: interfering somewhat with fine motor activities and noticeable with ambulation, may not be able to tolerate additional taxane. Cycle 4carbo only, this neuropathy no worse since that treatment 6.chemo neutropenia: will need granix x 3 with cycle 5. 7.hypothyroid on replacement by PCP 8.post cholecystectomy and remote BTL, past GERD 9.Up to date mammograms and colonoscopy 10.Atherosclerotic thoracic and abdominal aorta by CT 11.intentional weight loss in past year, ~ 40 lbs, weight stable andbetter hydrated now. 12..Indeterminate right renal lesion on CT 13.flu vaccine 06-23-16  14.PAC in: will need to be flushed every 6-8 weeks after chemo completes  All questions answered and patient / husband understand recommendations / are in agreement with plans above. Chemo and granix orders adjusted. Time spent 40 min including >50% counseling and coordination of care. CC Drs Alycia Rossetti, Charlesetta Shanks, Tonita Cong and in EMR for IR.    Evlyn Clines, MD   09/25/2016, 3:40 PM

## 2016-09-26 ENCOUNTER — Ambulatory Visit: Payer: Medicare Other

## 2016-09-26 ENCOUNTER — Other Ambulatory Visit: Payer: Self-pay | Admitting: Radiology

## 2016-09-27 ENCOUNTER — Ambulatory Visit: Payer: Medicare Other

## 2016-09-29 ENCOUNTER — Encounter: Payer: Self-pay | Admitting: Oncology

## 2016-09-29 DIAGNOSIS — T451X5A Adverse effect of antineoplastic and immunosuppressive drugs, initial encounter: Secondary | ICD-10-CM

## 2016-09-29 DIAGNOSIS — D701 Agranulocytosis secondary to cancer chemotherapy: Secondary | ICD-10-CM | POA: Insufficient documentation

## 2016-09-29 DIAGNOSIS — M79605 Pain in left leg: Secondary | ICD-10-CM

## 2016-09-29 DIAGNOSIS — M79604 Pain in right leg: Secondary | ICD-10-CM | POA: Insufficient documentation

## 2016-09-29 DIAGNOSIS — R188 Other ascites: Secondary | ICD-10-CM | POA: Insufficient documentation

## 2016-09-30 ENCOUNTER — Other Ambulatory Visit: Payer: Self-pay | Admitting: Oncology

## 2016-09-30 ENCOUNTER — Other Ambulatory Visit (HOSPITAL_COMMUNITY): Payer: Medicare Other

## 2016-09-30 ENCOUNTER — Ambulatory Visit (HOSPITAL_COMMUNITY)
Admission: RE | Admit: 2016-09-30 | Discharge: 2016-09-30 | Disposition: A | Discharge status: Home / Self Care | Payer: Medicare Other | Source: Ambulatory Visit | Attending: Oncology | Admitting: Oncology

## 2016-09-30 ENCOUNTER — Ambulatory Visit (HOSPITAL_COMMUNITY): Admission: RE | Admit: 2016-09-30 | Payer: Medicare Other | Source: Ambulatory Visit

## 2016-09-30 ENCOUNTER — Encounter (HOSPITAL_COMMUNITY): Payer: Self-pay

## 2016-09-30 DIAGNOSIS — R109 Unspecified abdominal pain: Secondary | ICD-10-CM | POA: Insufficient documentation

## 2016-09-30 DIAGNOSIS — Z87891 Personal history of nicotine dependence: Secondary | ICD-10-CM | POA: Diagnosis not present

## 2016-09-30 DIAGNOSIS — K449 Diaphragmatic hernia without obstruction or gangrene: Secondary | ICD-10-CM | POA: Insufficient documentation

## 2016-09-30 DIAGNOSIS — D709 Neutropenia, unspecified: Secondary | ICD-10-CM | POA: Insufficient documentation

## 2016-09-30 DIAGNOSIS — C541 Malignant neoplasm of endometrium: Secondary | ICD-10-CM | POA: Diagnosis not present

## 2016-09-30 DIAGNOSIS — E669 Obesity, unspecified: Secondary | ICD-10-CM | POA: Diagnosis not present

## 2016-09-30 DIAGNOSIS — Z90710 Acquired absence of both cervix and uterus: Secondary | ICD-10-CM | POA: Insufficient documentation

## 2016-09-30 DIAGNOSIS — K5730 Diverticulosis of large intestine without perforation or abscess without bleeding: Secondary | ICD-10-CM | POA: Insufficient documentation

## 2016-09-30 DIAGNOSIS — K429 Umbilical hernia without obstruction or gangrene: Secondary | ICD-10-CM | POA: Insufficient documentation

## 2016-09-30 DIAGNOSIS — I898 Other specified noninfective disorders of lymphatic vessels and lymph nodes: Secondary | ICD-10-CM | POA: Insufficient documentation

## 2016-09-30 DIAGNOSIS — R188 Other ascites: Secondary | ICD-10-CM

## 2016-09-30 DIAGNOSIS — M8580 Other specified disorders of bone density and structure, unspecified site: Secondary | ICD-10-CM | POA: Diagnosis not present

## 2016-09-30 DIAGNOSIS — M549 Dorsalgia, unspecified: Secondary | ICD-10-CM | POA: Insufficient documentation

## 2016-09-30 DIAGNOSIS — Z9049 Acquired absence of other specified parts of digestive tract: Secondary | ICD-10-CM | POA: Insufficient documentation

## 2016-09-30 DIAGNOSIS — Z86010 Personal history of colonic polyps: Secondary | ICD-10-CM | POA: Insufficient documentation

## 2016-09-30 DIAGNOSIS — M4316 Spondylolisthesis, lumbar region: Secondary | ICD-10-CM | POA: Diagnosis not present

## 2016-09-30 DIAGNOSIS — M47816 Spondylosis without myelopathy or radiculopathy, lumbar region: Secondary | ICD-10-CM | POA: Diagnosis not present

## 2016-09-30 DIAGNOSIS — I700 Atherosclerosis of aorta: Secondary | ICD-10-CM | POA: Insufficient documentation

## 2016-09-30 DIAGNOSIS — E039 Hypothyroidism, unspecified: Secondary | ICD-10-CM | POA: Diagnosis not present

## 2016-09-30 DIAGNOSIS — K219 Gastro-esophageal reflux disease without esophagitis: Secondary | ICD-10-CM | POA: Diagnosis not present

## 2016-09-30 LAB — CBC
HEMATOCRIT: 33.3 % — AB (ref 36.0–46.0)
Hemoglobin: 11.3 g/dL — ABNORMAL LOW (ref 12.0–15.0)
MCH: 31.7 pg (ref 26.0–34.0)
MCHC: 33.9 g/dL (ref 30.0–36.0)
MCV: 93.3 fL (ref 78.0–100.0)
Platelets: 161 10*3/uL (ref 150–400)
RBC: 3.57 MIL/uL — ABNORMAL LOW (ref 3.87–5.11)
RDW: 15.9 % — AB (ref 11.5–15.5)
WBC: 2.6 10*3/uL — AB (ref 4.0–10.5)

## 2016-09-30 LAB — PROTIME-INR
INR: 1
Prothrombin Time: 13.2 seconds (ref 11.4–15.2)

## 2016-09-30 LAB — APTT: aPTT: 25 seconds (ref 24–36)

## 2016-09-30 MED ORDER — SODIUM CHLORIDE 0.9 % IV SOLN: INTRAVENOUS | Status: DC

## 2016-09-30 MED ORDER — MIDAZOLAM HCL 2 MG/2ML IJ SOLN
INTRAMUSCULAR | Status: AC | PRN
  Administered 2016-09-30 (×2): 1 mg via INTRAVENOUS

## 2016-09-30 MED ORDER — LIDOCAINE HCL (PF) 1 % IJ SOLN
INTRAMUSCULAR | Status: AC
  Filled 2016-09-30: qty 30

## 2016-09-30 MED ORDER — FENTANYL CITRATE (PF) 100 MCG/2ML IJ SOLN
INTRAMUSCULAR | Status: AC | PRN
  Administered 2016-09-30: 50 ug via INTRAVENOUS

## 2016-09-30 MED ORDER — MIDAZOLAM HCL 2 MG/2ML IJ SOLN
INTRAMUSCULAR | Status: AC
  Filled 2016-09-30: qty 4

## 2016-09-30 MED ORDER — HEPARIN SOD (PORK) LOCK FLUSH 100 UNIT/ML IV SOLN
INTRAVENOUS | Status: AC
  Filled 2016-09-30: qty 5

## 2016-09-30 MED ORDER — FENTANYL CITRATE (PF) 100 MCG/2ML IJ SOLN
INTRAMUSCULAR | Status: AC
  Filled 2016-09-30: qty 4

## 2016-09-30 MED ORDER — HEPARIN SOD (PORK) LOCK FLUSH 100 UNIT/ML IV SOLN
500.0000 [IU] | Freq: Once | INTRAVENOUS | Status: AC
  Administered 2016-09-30: 500 [IU] via INTRAVENOUS

## 2016-09-30 NOTE — H&P (Signed)
Chief Complaint: Patient was seen in consultation today for right pelvic cyst aspiration at the request of Livesay,Lennis P  Referring Physician(s): [place] P  Supervising Physician: Jacqulynn Cadet  Patient Status: [facility] - Out-pt  History of Present Illness: Kayla Guerrero is a 82 y.o. female   Hx endometrial cancer 03/2016 Surgery 04/2016 Radiation therapy complete Ongoing chemo therapy---although last 3 appts delayed secondary neutropenia Developed B leg abd back pain for weeks CT 12/26: IMPRESSION: 1. No findings suspicious for metastatic disease in the abdomen or pelvis. 2. Thin-walled lobulated 6.9 x 5.8 x 5.8 cm right pelvic sidewall fluid collection, increased in size since 05/16/2016 CT study, favor a postoperative lymphocele, which demonstrates extrinsic mass-effect on the right bladder wall. 3. Indeterminate 1.2 cm posterior interpolar right renal cortical lesion, for which 4 month stability has been demonstrated, renal neoplasm not excluded. Recommend either dedicated renal protocol MRI or CT abdomen without and with IV contrast or continued attention on follow-up surveillance CT studies, as clinically warranted. 4. Additional findings include aortic atherosclerosis, moderate hiatal hernia, moderate sigmoid diverticulosis, tiny fat containing umbilical hernia, and degenerative disc disease, facet arthropathy and spondylolisthesis in the lower lumbar spine.  Now scheduled for aspiration of pelvic cyst    Past Medical History:  Diagnosis Date  . Arthritis   . Arthritis of knee, left   . Borderline osteopenia DEXA 2006 and 2012  . Cancer [facility])    endometrial  . Cataract   . Cholesterol serum elevated   . Colon polyps   . FHx: colon cancer   . GERD (gastroesophageal reflux disease)   . Hypothyroidism   . Obesity     Past Surgical History:  Procedure Laterality Date  . CATARACT EXTRACTION, BILATERAL    . CHOLECYSTECTOMY    . COLONOSCOPY   2010, 04/2013   Buccini  . IR GENERIC HISTORICAL  08/11/2016   IR FLUORO GUIDE PORT INSERTION RIGHT 08/11/2016 Greggory Keen, MD [facility]-INTERV RAD  . IR GENERIC HISTORICAL  08/11/2016   IR US GUIDE VASC ACCESS RIGHT 08/11/2016 Greggory Keen, MD [facility]INTER[facility]  . PTERYGIUM EXCISION    . ROBOTIC ASSISTED TOTAL HYSTERECTOMY WITH BILATERAL SALPINGO OOPHERECTOMY N/A 05/06/2016   Procedure: XI ROBOTIC ASSISTED  LAPAROSCOPIC TOTAL HYSTERECTOMY WITH BILATERAL SALPINGO OOPHORECTOMY;  Surgeon: Nancy Marus, MD;  Location: [facility] ORS;  Service: Gynecology;  Laterality: N/A;  . SENTINEL NODE BIOPSY N/A 05/06/2016   Procedure: SENTINEL NODE BIOPSY;  Surgeon: Nancy Marus, MD;  Location: [facility] ORS;  Service: Gynecology;  Laterality: N/A;  . TUBAL LIGATION      Allergies: Levaquin [levofloxacin hemihydrate]; Promethazine hcl; and Sulfa drugs cross reactors  Medications: Prior to Admission medications   Medication Sig Start Date End Date Taking? Authorizing Provider  b complex vitamins capsule Take 1 capsule by mouth daily.   Yes Historical Provider, MD  Cholecalciferol (VITAMIN D) 2000 UNITS tablet Take 2,000 Units by mouth daily.   Yes Historical Provider, MD  glucosamine-chondroitin 500-400 MG tablet Take 1 tablet by mouth 2 (two) times daily.    Yes Historical Provider, MD  levothyroxine (SYNTHROID, LEVOTHROID) 88 MCG tablet TAKE ONE TABLET BY MOUTH ONCE DAILY BEFORE  BREAKFAST Patient taking differently: TAKE 34mcg TABLET BY MOUTH ONCE DAILY BEFORE  BREAKFAST 07/10/16  Yes Rita Ohara, MD  loratadine (CLARITIN) 10 MG tablet Take 10 mg by mouth daily as needed for allergies.   Yes Historical Provider, MD  Magnesium 400 MG CAPS Take 1 capsule by mouth 2 (two) times daily.  Yes Historical Provider, MD  MELATONIN PO Take 1 tablet by mouth at bedtime as needed.   Yes Historical Provider, MD  Multiple Vitamin (MULTI-VITAMIN) tablet Take 1 tablet by mouth daily.     Yes Historical Provider, MD  naproxen sodium (ANAPROX)  220 MG tablet Take 220 mg by mouth 2 (two) times daily as needed (pain).    Yes Historical Provider, MD  oxyCODONE-acetaminophen (PERCOCET/ROXICET) 5-325 MG tablet Take 1-2 tablets by mouth every 4 (four) hours as needed (moderate to severe pain). 09/18/16  Yes Lennis Marion Downer, MD  polyethylene glycol (MIRALAX / GLYCOLAX) packet Take 17 g by mouth 2 (two) times daily as needed for mild constipation.    Yes Historical Provider, MD  terbinafine (LAMISIL) 250 MG tablet Take 1 tablet (250 mg total) by mouth daily. Patient taking differently: Take 250 mg by mouth every other day.  08/12/16  Yes Max T Hyatt, DPM  Wheat Dextrin (BENEFIBER DRINK MIX PO) Take 1 scoop by mouth 2 (two) times daily.    Yes Historical Provider, MD  dexamethasone (DECADRON) 4 MG tablet Take 5 tablets with food 12 hrs and 6 hrs prior to Taxol Chemotherapy. Patient not taking: Reported on 09/30/2016 07/31/16   [name], MD  lidocaine-prilocaine (EMLA) cream Apply to Porta-Cath 1-2 hours prior to access as directed. Patient not taking: Reported on 09/30/2016 08/08/16   Lennis Marion Downer, MD  LORazepam (ATIVAN) 0.5 MG tablet Place 1 tablet under the tongue or swallow every 6 hrs as needed for nausea. Will make drowsy. Patient not taking: Reported on 09/30/2016 07/31/16   Lennis Marion Downer, MD  ondansetron (ZOFRAN) 8 MG tablet Take 1 tablet (8 mg total) by mouth every 8 (eight) hours as needed for nausea (Will not make drowsy.). Patient not taking: Reported on 09/30/2016 06/06/16   [name], MD  Red Yeast Rice Extract (RED YEAST RICE PO) Take 1-2 tablets by mouth 2 (two) times daily. Take 1 tablet in the morning and then take 2 tablets with dinner    Historical Provider, MD  senna-docusate (SENOKOT S) 8.6-50 MG tablet Take 1 tablet by mouth 2 (two) times daily as needed for mild constipation.    Historical Provider, MD     Family History  Problem Relation Age of Onset  . Cancer Mother 60    COLON   . Heart disease Mother   .  Cancer Maternal Aunt   . Breast cancer Maternal Aunt   . Mental illness Maternal Uncle   . Cancer Maternal Grandmother     stomach  . Macular degeneration Maternal Grandmother   . Diabetes Neg Hx     Social History   Social History  . Marital status: Married    Spouse name: N/A  . Number of children: 1  . Years of education: N/A   Occupational History  . retired Librarian, academic); Artist    Social History Main Topics  . Smoking status: Former Smoker    Types: Cigarettes    Quit date: 09/29/1980  . Smokeless tobacco: Never Used  . Alcohol use 0.0 oz/week     Comment: 1 glass of wine 3-4 times/week; hard liquor (highball) or a beer once a week  . Drug use: No  . Sexual activity: Not Asked   Other Topics Concern  . None   Social History Narrative   Lives with husband.  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  MFA from [employer], doing paper  collage, abstract work. Son lives in [place]; granddaughter lives in [place] (with her Verlee Rossetti is divorced)     Review of Systems: A 12 point ROS discussed and pertinent positives are indicated in the HPI above.  All other systems are negative.  Review of Systems  Constitutional: Positive for activity change. Negative for appetite change, fatigue and fever.  Cardiovascular: Negative for leg swelling.  Musculoskeletal: Positive for back pain.  Neurological: Positive for weakness.  Psychiatric/Behavioral: Negative for behavioral problems and confusion.    Vital Signs: BP (!) 169/103 (BP Location: Right Arm)   Pulse 82   Temp 98.4 F (36.9 C) (Oral)   Resp 18   Ht 5\' 6"  (1.676 m)   Wt 175 lb (79.4 kg)   SpO2 100%   BMI 28.25 kg/m   Physical Exam  Constitutional: She is oriented to person, place, and time.  Cardiovascular: Normal rate, regular rhythm and normal heart sounds.   Pulmonary/Chest: Effort normal and breath sounds normal.  Abdominal: Soft. Bowel sounds are normal.    Musculoskeletal: Normal range of motion.  Neurological: She is alert and oriented to person, place, and time.  Skin: Skin is warm and dry.  Psychiatric: She has a normal mood and affect. Her behavior is normal. Judgment and thought content normal.  Nursing note and vitals reviewed.   Mallampati Score:  MD Evaluation Airway: WNL Heart: WNL Abdomen: WNL Chest/ Lungs: WNL ASA  Classification: 3 Mallampati/Airway Score: One  Imaging: Ct Abdomen Pelvis W Contrast  Result Date: 09/23/2016 CLINICAL DATA:  82 year old female with a history of endometrial cancer diagnosed in July 2017 status post TAHBSO 05/06/2016, radiation therapy with ongoing chemotherapy. Patient reports low back pain radiating to the lower extremities. EXAM: CT ABDOMEN AND PELVIS WITH CONTRAST TECHNIQUE: Multidetector CT imaging of the abdomen and pelvis was performed using the standard protocol following bolus administration of intravenous contrast. CONTRAST:  162mL ISOVUE-300 IOPAMIDOL (ISOVUE-300) INJECTION 61% COMPARISON:  05/16/2016 CT abdomen/ pelvis. FINDINGS: Lower chest: No significant pulmonary nodules or acute consolidative airspace disease. Hepatobiliary: Normal liver size. There are 4 scattered subcentimeter hypodense liver lesions, too small to characterize, not appreciably changed back to 05/16/2016, suggesting benign lesions. No new liver lesions. Cholecystectomy. Stable bile ducts with mild central intrahepatic biliary ductal dilatation and common bile duct diameter 8 mm, within expected post cholecystectomy limits. No radiopaque choledocholithiasis. Pancreas: Normal, with no mass or duct dilation. Spleen: Normal size. No mass. Adrenals/Urinary Tract: Normal adrenals. Indeterminate hypodense 1.2 cm renal cortical lesion in the posterior interpolar right kidney (series 7/ image 23) is stable in size since 05/16/2016. Additional subcentimeter hypodense renal cortical lesions in both kidneys are too small to  characterize and stable, suggesting benign renal cysts. No hydronephrosis. Relatively collapsed and grossly normal bladder, noting extrinsic mass effect on the right bladder wall by the right pelvic sidewall collection. Stomach/Bowel: Moderate hiatal hernia. Otherwise relatively collapsed and grossly normal stomach. Normal caliber small bowel with no small bowel wall thickening. Normal appendix. Moderate sigmoid diverticulosis, with no large bowel wall thickening or pericolonic fat stranding. Oral contrast progresses to the hepatic flexure of the colon. Vascular/Lymphatic: Atherosclerotic nonaneurysmal abdominal aorta. Patent portal, splenic, hepatic and renal veins. No pathologically enlarged lymph nodes in the abdomen or pelvis. Reproductive: Status post hysterectomy. No masses at the vaginal cuff. Right pelvic sidewall thin-walled lobulated fluid collection measuring 6.9 x 5.8 x 5.8 cm (series 2/ image 74), increased from 4.1 x 2.9 x 3.7 cm on 05/16/2016 CT study. Otherwise no adnexal  masses. No peritoneal nodularity. Other: No pneumoperitoneum. No ascites. Stable tiny fat containing umbilical hernia. Musculoskeletal: No aggressive appearing focal osseous lesions. Moderate to severe thoracolumbar spondylosis, most prominent at L5-S1. Prominent facet arthropathy bilaterally in the lower lumbar spine. Stable 4 mm anterolisthesis at L4-5. Stable 3 mm retrolisthesis at L5-S1. IMPRESSION: 1. No findings suspicious for metastatic disease in the abdomen or pelvis. 2. Thin-walled lobulated 6.9 x 5.8 x 5.8 cm right pelvic sidewall fluid collection, increased in size since 05/16/2016 CT study, favor a postoperative lymphocele, which demonstrates extrinsic mass-effect on the right bladder wall. 3. Indeterminate 1.2 cm posterior interpolar right renal cortical lesion, for which 4 month stability has been demonstrated, renal neoplasm not excluded. Recommend either dedicated renal protocol MRI or CT abdomen without and with IV  contrast or continued attention on follow-up surveillance CT studies, as clinically warranted. 4. Additional findings include aortic atherosclerosis, moderate hiatal hernia, moderate sigmoid diverticulosis, tiny fat containing umbilical hernia, and degenerative disc disease, facet arthropathy and spondylolisthesis in the lower lumbar spine. Electronically Signed   By: Ilona Sorrel M.D.   On: 09/23/2016 14:13    Labs:  CBC:  Recent Labs  09/11/16 1517 09/12/16 0959 09/18/16 0926 09/25/16 1146  WBC 2.6* 10.6* 3.3* 2.6*  HGB 10.4* 10.7* 11.4* 10.7*  HCT 30.9* 31.8* 34.3* 31.7*  PLT 177 183 211 249    COAGS:  Recent Labs  08/11/16 1256  INR 0.94    BMP:  Recent Labs  05/01/16 1130 05/07/16 0455  07/29/16 1213  08/11/16 1256  09/01/16 1235 09/11/16 1517 09/18/16 0926 09/25/16 1146  NA 140 137  < > 137  < > 139  < > 137 140 140 139  K 4.5 4.7  < > 3.9  < > 4.1  < > 4.1 3.6 4.0 4.0  CL 104 107  --  104  --  106  --   --   --   --   --   CO2 29 27  < > 28  < > 23  < > 24 24 26 25   GLUCOSE 93 120*  < > 104*  < > 93  < > 88 117 62* 99  BUN 13 12  < > 19  < > 14  < > 11.2 13.4 12.3 12.9  CALCIUM 8.9 8.0*  < > 8.5*  < > 8.9  < > 8.8 8.8 8.7 8.8  CREATININE 0.93 0.90  < > 0.79  < > 0.74  < > 0.7 0.8 0.8 0.8  GFRNONAA 57* 59*  --  >60  --  >60  --   --   --   --   --   GFRAA >60 >60  --  >60  --  >60  --   --   --   --   --   < > = values in this interval not displayed.  LIVER FUNCTION TESTS:  Recent Labs  09/01/16 1235 09/11/16 1517 09/18/16 0926 09/25/16 1146  BILITOT 0.37 <0.22 0.23 0.29  AST 23 19 20 20   ALT 10 11 10 9   ALKPHOS 73 72 74 69  PROT 6.4 6.5 6.7 6.4  ALBUMIN 3.3* 3.1* 3.4* 3.4*    TUMOR MARKERS: No results for input(s): AFPTM, CEA, CA199, CHROMGRNA in the last 8760 hours.  Assessment and Plan:  Endometrial ca 03/2016 Pelvic surgery 8/17 Now with leg and back [ain Noted enlarging pelvic cyst (Rt) Scheduled for aspiration  Risks and Benefits  discussed with the  patient including, but not limited to bleeding, infection, damage to adjacent structures or low yield requiring additional tests. All of the patient's questions were answered, patient is agreeable to proceed. Consent signed and in chart.   Thank you for this interesting consult.  I greatly enjoyed meeting LEMMIE GARNET and look forward to participating in their care.  A copy of this report was sent to the requesting provider on this date.  Electronically Signed: Whitley Strycharz A 09/30/2016, 10:04 AM   I spent a total of  30 Minutes   in face to face in clinical consultation, greater than 50% of which was counseling/coordinating care for right pelvic cyst aspiration

## 2016-09-30 NOTE — Procedures (Addendum)
Interventional Radiology Procedure Note  Procedure: CT guided aspiration right pelvic fluid collection yielding 90 mL clear yellow fluid.  Complications: None  Estimated Blood Loss: None  Recommendations: - Cultures and cytology sent - DC home  Signed,  Criselda Peaches, MD

## 2016-09-30 NOTE — Discharge Instructions (Signed)
Incision and Drainage, Care After Refer to this sheet in the next few weeks. These instructions provide you with information about caring for yourself after your procedure. Your health care provider may also give you more specific instructions. Your treatment has been planned according to current medical practices, but problems sometimes occur. Call your health care provider if you have any problems or questions after your procedure. What can I expect after the procedure? After the procedure, it is common to have:  Pain or discomfort around your incision site.  Drainage from your incision. Follow these instructions at home:  Take over-the-counter and prescription medicines only as told by your health care provider.  If you were prescribed an antibiotic medicine, take it as told by your health care provider.Do not stop taking the antibiotic even if you start to feel better.  Followinstructions from your health care provider about:  How to take care of your incision.  When and how you should change your packing and bandage (dressing). Wash your hands with soap and water before you change your dressing. If soap and water are not available, use hand sanitizer.  When you should remove your dressing.  Do not take baths, swim, or use a hot tub until your health care provider approves.  Keep all follow-up visits as told by your health care provider. This is important.  Check your incision area every day for signs of infection. Check for:  More redness, swelling, or pain.  More fluid or blood.  Warmth.  Pus or a bad smell. Contact a health care provider if:  Your cyst or abscess returns.  You have a fever.  You have more redness, swelling, or pain around your incision.  You have more fluid or blood coming from your incision.  Your incision feels warm to the touch.  You have pus or a bad smell coming from your incision. Get help right away if:  You have severe pain or  bleeding.  You cannot eat or drink without vomiting.  You have decreased urine output.  You become short of breath.  You have chest pain.  You cough up blood.  The area where the incision and drainage occurred becomes numb or it tingles. This information is not intended to replace advice given to you by your health care provider. Make sure you discuss any questions you have with your health care provider. Document Released: 12/08/2011 Document Revised: 02/15/2016 Document Reviewed: 07/06/2015 Elsevier Interactive Patient Education  2017 Media. Moderate Conscious Sedation, Adult, Care After These instructions provide you with information about caring for yourself after your procedure. Your health care provider may also give you more specific instructions. Your treatment has been planned according to current medical practices, but problems sometimes occur. Call your health care provider if you have any problems or questions after your procedure. What can I expect after the procedure? After your procedure, it is common:  To feel sleepy for several hours.  To feel clumsy and have poor balance for several hours.  To have poor judgment for several hours.  To vomit if you eat too soon. Follow these instructions at home: For at least 24 hours after the procedure:   Do not:  Participate in activities where you could fall or become injured.  Drive.  Use heavy machinery.  Drink alcohol.  Take sleeping pills or medicines that cause drowsiness.  Make important decisions or sign legal documents.  Take care of children on your own.  Rest. Eating and drinking  Follow the diet recommended by your health care provider.  If you vomit:  Drink water, juice, or soup when you can drink without vomiting.  Make sure you have little or no nausea before eating solid foods. General instructions  Have a responsible adult stay with you until you are awake and alert.  Take  over-the-counter and prescription medicines only as told by your health care provider.  If you smoke, do not smoke without supervision.  Keep all follow-up visits as told by your health care provider. This is important. Contact a health care provider if:  You keep feeling nauseous or you keep vomiting.  You feel light-headed.  You develop a rash.  You have a fever. Get help right away if:  You have trouble breathing. This information is not intended to replace advice given to you by your health care provider. Make sure you discuss any questions you have with your health care provider. Document Released: 07/06/2013 Document Revised: 02/18/2016 Document Reviewed: 01/05/2016 Elsevier Interactive Patient Education  2017 [employer].

## 2016-10-02 ENCOUNTER — Ambulatory Visit: Payer: Medicare Other

## 2016-10-02 ENCOUNTER — Other Ambulatory Visit: Payer: Medicare Other

## 2016-10-05 LAB — AEROBIC/ANAEROBIC CULTURE (SURGICAL/DEEP WOUND): CULTURE: NO GROWTH

## 2016-10-05 LAB — AEROBIC/ANAEROBIC CULTURE W GRAM STAIN (SURGICAL/DEEP WOUND)

## 2016-10-07 ENCOUNTER — Encounter: Payer: Self-pay | Admitting: Oncology

## 2016-10-08 ENCOUNTER — Other Ambulatory Visit: Payer: Self-pay | Admitting: Oncology

## 2016-10-08 ENCOUNTER — Other Ambulatory Visit: Payer: Self-pay | Admitting: Family Medicine

## 2016-10-09 ENCOUNTER — Telehealth: Payer: Self-pay | Admitting: Gynecologic Oncology

## 2016-10-09 ENCOUNTER — Ambulatory Visit (HOSPITAL_BASED_OUTPATIENT_CLINIC_OR_DEPARTMENT_OTHER): Payer: Medicare Other | Admitting: Oncology

## 2016-10-09 ENCOUNTER — Other Ambulatory Visit (HOSPITAL_BASED_OUTPATIENT_CLINIC_OR_DEPARTMENT_OTHER): Payer: Medicare Other

## 2016-10-09 ENCOUNTER — Other Ambulatory Visit: Payer: Self-pay | Admitting: Gynecologic Oncology

## 2016-10-09 ENCOUNTER — Encounter: Payer: Self-pay | Admitting: Radiation Oncology

## 2016-10-09 ENCOUNTER — Ambulatory Visit
Admission: RE | Admit: 2016-10-09 | Discharge: 2016-10-09 | Disposition: A | Discharge status: Home / Self Care | Payer: Medicare Other | Source: Ambulatory Visit | Attending: Radiation Oncology | Admitting: Radiation Oncology

## 2016-10-09 VITALS — BP 167/82 | HR 79 | Temp 97.4°F | Resp 18 | Ht 66.0 in | Wt 178.5 lb

## 2016-10-09 DIAGNOSIS — K5730 Diverticulosis of large intestine without perforation or abscess without bleeding: Secondary | ICD-10-CM | POA: Insufficient documentation

## 2016-10-09 DIAGNOSIS — Z95828 Presence of other vascular implants and grafts: Secondary | ICD-10-CM

## 2016-10-09 DIAGNOSIS — Z90710 Acquired absence of both cervix and uterus: Secondary | ICD-10-CM | POA: Insufficient documentation

## 2016-10-09 DIAGNOSIS — Z9049 Acquired absence of other specified parts of digestive tract: Secondary | ICD-10-CM | POA: Diagnosis not present

## 2016-10-09 DIAGNOSIS — M47814 Spondylosis without myelopathy or radiculopathy, thoracic region: Secondary | ICD-10-CM | POA: Diagnosis not present

## 2016-10-09 DIAGNOSIS — M159 Polyosteoarthritis, unspecified: Secondary | ICD-10-CM

## 2016-10-09 DIAGNOSIS — I700 Atherosclerosis of aorta: Secondary | ICD-10-CM | POA: Insufficient documentation

## 2016-10-09 DIAGNOSIS — C541 Malignant neoplasm of endometrium: Secondary | ICD-10-CM

## 2016-10-09 DIAGNOSIS — T451X5A Adverse effect of antineoplastic and immunosuppressive drugs, initial encounter: Secondary | ICD-10-CM

## 2016-10-09 DIAGNOSIS — N289 Disorder of kidney and ureter, unspecified: Secondary | ICD-10-CM

## 2016-10-09 DIAGNOSIS — K429 Umbilical hernia without obstruction or gangrene: Secondary | ICD-10-CM | POA: Diagnosis not present

## 2016-10-09 DIAGNOSIS — G620 Drug-induced polyneuropathy: Secondary | ICD-10-CM

## 2016-10-09 DIAGNOSIS — D701 Agranulocytosis secondary to cancer chemotherapy: Secondary | ICD-10-CM

## 2016-10-09 DIAGNOSIS — K449 Diaphragmatic hernia without obstruction or gangrene: Secondary | ICD-10-CM | POA: Diagnosis not present

## 2016-10-09 DIAGNOSIS — M4316 Spondylolisthesis, lumbar region: Secondary | ICD-10-CM | POA: Diagnosis not present

## 2016-10-09 DIAGNOSIS — G629 Polyneuropathy, unspecified: Secondary | ICD-10-CM

## 2016-10-09 DIAGNOSIS — E039 Hypothyroidism, unspecified: Secondary | ICD-10-CM

## 2016-10-09 LAB — CBC WITH DIFFERENTIAL/PLATELET
BASO%: 0.7 % (ref 0.0–2.0)
Basophils Absolute: 0 10*3/uL (ref 0.0–0.1)
EOS ABS: 0.1 10*3/uL (ref 0.0–0.5)
EOS%: 3.2 % (ref 0.0–7.0)
HEMATOCRIT: 35.4 % (ref 34.8–46.6)
HEMOGLOBIN: 11.8 g/dL (ref 11.6–15.9)
LYMPH#: 1.1 10*3/uL (ref 0.9–3.3)
LYMPH%: 26.3 % (ref 14.0–49.7)
MCH: 31.6 pg (ref 25.1–34.0)
MCHC: 33.3 g/dL (ref 31.5–36.0)
MCV: 94.7 fL (ref 79.5–101.0)
MONO#: 0.4 10*3/uL (ref 0.1–0.9)
MONO%: 9.7 % (ref 0.0–14.0)
NEUT%: 60.1 % (ref 38.4–76.8)
NEUTROS ABS: 2.6 10*3/uL (ref 1.5–6.5)
PLATELETS: 249 10*3/uL (ref 145–400)
RBC: 3.74 10*6/uL (ref 3.70–5.45)
RDW: 14.7 % — AB (ref 11.2–14.5)
WBC: 4.3 10*3/uL (ref 3.9–10.3)

## 2016-10-09 LAB — COMPREHENSIVE METABOLIC PANEL
ALBUMIN: 3.3 g/dL — AB (ref 3.5–5.0)
ALK PHOS: 73 U/L (ref 40–150)
ANION GAP: 9 meq/L (ref 3–11)
AST: 16 U/L (ref 5–34)
BILIRUBIN TOTAL: 0.24 mg/dL (ref 0.20–1.20)
BUN: 16 mg/dL (ref 7.0–26.0)
CO2: 28 meq/L (ref 22–29)
CREATININE: 0.8 mg/dL (ref 0.6–1.1)
Calcium: 9 mg/dL (ref 8.4–10.4)
Chloride: 105 mEq/L (ref 98–109)
EGFR: 66 mL/min/{1.73_m2} — ABNORMAL LOW (ref >90)
Glucose: 73 mg/dl (ref 70–140)
Potassium: 4.1 mEq/L (ref 3.5–5.1)
Sodium: 141 mEq/L (ref 136–145)
TOTAL PROTEIN: 6.7 g/dL (ref 6.4–8.3)

## 2016-10-09 NOTE — Telephone Encounter (Signed)
Attempted to call patient about CT scan.  Left message on her home number asking her to please call the office to arrange a time to come and pick up her oral contrast.  Dates and instructions for her scan left on the message as well and also printed out for her when she comes to pick up contrast.

## 2016-10-09 NOTE — Progress Notes (Signed)
Radiation Oncology         [phone] ________________________________  Name: Kayla Guerrero MRN: [id number]  Date: 10/09/2016  DOB: [birth year]  Follow-Up Visit Note  CC: KNAPP,EVE A, MD  [name], MD    ICD-9-CM ICD-10-CM   1. 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  Narrative:  The patient returns today for routine follow-up of HDR treatment completed to the Vaginal Cuff.  On review of systems, the patient reports having pain in her lower back and legs, which she rates 2/10 in severity. She reports the pain has been present since chemotherapy and radiation. She reports urinary frequency that she says has worsened with chemotherapy and radiation. She denies any bowel issues. Denies vaginal bleeding. Reports a very good appetite. She reports feeling fatigued. The patient had an aspiration of pelvic fluid on 09/30/16. She will have chemotherapy today.                           ALLERGIES:  is allergic to levaquin [levofloxacin hemihydrate]; promethazine hcl; and sulfa drugs cross reactors.  Meds: Current Outpatient Prescriptions  Medication Sig Dispense Refill  . b complex vitamins capsule Take 1 capsule by mouth daily.    . Cholecalciferol (VITAMIN D) 2000 UNITS tablet Take 2,000 Units by mouth daily.    [name] glucosamine-chondroitin 500-400 MG tablet Take 1 tablet by mouth 2 (two) times daily.     [name] levothyroxine (SYNTHROID, LEVOTHROID) 88 MCG tablet TAKE ONE TABLET BY MOUTH ONCE DAILY BEFORE BREAKFAST 90 tablet 0  . lidocaine-prilocaine (EMLA) cream     . loratadine (CLARITIN) 10 MG tablet Take 10 mg by mouth daily as needed for allergies.    . Magnesium 400 MG CAPS Take 1 capsule by mouth 2 (two) times daily.     [name] MELATONIN PO Take 1 tablet by mouth at bedtime as needed.     . Multiple Vitamin (MULTI-VITAMIN) tablet Take 1 tablet by mouth daily.      . naproxen sodium (ANAPROX) 220 MG tablet Take 220 mg by mouth 2 (two) times daily as needed (pain).     [name] oxyCODONE-acetaminophen (PERCOCET/ROXICET) 5-325 MG tablet Take 1-2 tablets by mouth every 4 (four) hours as needed (moderate to severe pain). 20 tablet 0  . polyethylene glycol (MIRALAX / GLYCOLAX) packet Take 17 g by mouth 2 (two) times daily as needed for mild constipation.     . senna-docusate (SENOKOT S) 8.6-50 MG tablet Take 1 tablet by mouth 2 (two) times daily as needed for mild constipation.    . terbinafine (LAMISIL) 250 MG tablet Take 1 tablet (250 mg total) by mouth daily. (Patient taking differently: Take 250 mg by mouth every other day. ) 30 tablet 0  . Wheat Dextrin (BENEFIBER DRINK MIX PO) Take 1 scoop by mouth 2 (two) times daily.     . Red Yeast Rice Extract (RED YEAST RICE PO) Take 1-2 tablets by mouth 2 (two) times daily. Take 1 tablet in the morning and then take 2 tablets with dinner     No current facility-administered medications for this encounter.    Facility-Administered Medications Ordered in Other Encounters  Medication Dose Route Frequency Provider Last Rate Last Dose  . influenza  inactive virus  vaccine (FLUZONE/FLUARIX) injection 0.5 mL  0.5 mL Intramuscular Once Rita Ohara, MD        Physical Findings: The patient is in no acute distress. Patient is alert and oriented.  height is 5\' 6"  (1.676 m) and weight is 178 lb 3.2 oz (80.8 kg). Her oral temperature is 98 F (36.7 C). Her blood pressure is 141/69 (abnormal) and her pulse is 91. Her oxygen saturation is 98%.  No significant changes. In general this is a well appearing Caucasian female in no acute distress. She's alert and oriented x4 and appropriate throughout the examination. Cardiopulmonary assessment is negative for acute distress and she exhibits normal effort. Pelvic exam not performed in light of her recent completion of  treatment.  Lab Findings: Lab Results  Component Value Date   WBC 4.3 10/09/2016   HGB 11.8 10/09/2016   HCT 35.4 10/09/2016   MCV 94.7 10/09/2016   PLT 249 10/09/2016    Radiographic Findings: Ct Abdomen Pelvis W Contrast  Result Date: 09/23/2016 CLINICAL DATA:  82 year old female with a history of endometrial cancer diagnosed in July 2017 status post TAHBSO 05/06/2016, radiation therapy with ongoing chemotherapy. Patient reports low back pain radiating to the lower extremities. EXAM: CT ABDOMEN AND PELVIS WITH CONTRAST TECHNIQUE: Multidetector CT imaging of the abdomen and pelvis was performed using the standard protocol following bolus administration of intravenous contrast. CONTRAST:  145mL ISOVUE-300 IOPAMIDOL (ISOVUE-300) INJECTION 61% COMPARISON:  05/16/2016 CT abdomen/ pelvis. FINDINGS: Lower chest: No significant pulmonary nodules or acute consolidative airspace disease. Hepatobiliary: Normal liver size. There are 4 scattered subcentimeter hypodense liver lesions, too small to characterize, not appreciably changed back to 05/16/2016, suggesting benign lesions. No new liver lesions. Cholecystectomy. Stable bile ducts with mild central intrahepatic biliary ductal dilatation and common bile duct diameter 8 mm, within expected post cholecystectomy limits. No radiopaque choledocholithiasis. Pancreas: Normal, with no mass or duct dilation. Spleen: Normal size. No mass. Adrenals/Urinary Tract: Normal adrenals. Indeterminate hypodense 1.2 cm renal cortical lesion in the posterior interpolar right kidney (series 7/ image 23) is stable in size since 05/16/2016. Additional subcentimeter hypodense renal cortical lesions in both kidneys are too small to characterize and stable, suggesting benign renal cysts. No hydronephrosis. Relatively collapsed and grossly normal bladder, noting extrinsic mass effect on the right bladder wall by the right pelvic sidewall collection. Stomach/Bowel: Moderate hiatal  hernia. Otherwise relatively collapsed and grossly normal stomach. Normal caliber small bowel with no small bowel wall thickening. Normal appendix. Moderate sigmoid diverticulosis, with no large bowel wall thickening or pericolonic fat stranding. Oral contrast progresses to the hepatic flexure of the colon. Vascular/Lymphatic: Atherosclerotic nonaneurysmal abdominal aorta. Patent portal, splenic, hepatic and renal veins. No pathologically enlarged lymph nodes in the abdomen or pelvis. Reproductive: Status post hysterectomy. No masses at the vaginal cuff. Right pelvic sidewall thin-walled lobulated fluid collection measuring 6.9 x 5.8 x 5.8 cm (series 2/ image 74), increased from 4.1 x 2.9 x 3.7 cm on 05/16/2016 CT study. Otherwise no adnexal masses. No peritoneal nodularity. Other: No pneumoperitoneum. No ascites. Stable tiny fat containing umbilical hernia. Musculoskeletal: No aggressive appearing focal osseous lesions. Moderate to severe thoracolumbar spondylosis, most prominent at L5-S1. Prominent facet arthropathy bilaterally in the lower lumbar spine. Stable 4 mm anterolisthesis at L4-5. Stable 3 mm retrolisthesis at L5-S1. IMPRESSION: 1. No findings suspicious for metastatic disease in the abdomen or pelvis. 2. Thin-walled lobulated 6.9 x 5.8 x 5.8 cm right pelvic sidewall fluid collection, increased in size since 05/16/2016 CT study, favor a  postoperative lymphocele, which demonstrates extrinsic mass-effect on the right bladder wall. 3. Indeterminate 1.2 cm posterior interpolar right renal cortical lesion, for which 4 month stability has been demonstrated, renal neoplasm not excluded. Recommend either dedicated renal protocol MRI or CT abdomen without and with IV contrast or continued attention on follow-up surveillance CT studies, as clinically warranted. 4. Additional findings include aortic atherosclerosis, moderate hiatal hernia, moderate sigmoid diverticulosis, tiny fat containing umbilical hernia, and  degenerative disc disease, facet arthropathy and spondylolisthesis in the lower lumbar spine. Electronically Signed   By: Ilona Sorrel M.D.   On: 09/23/2016 14:13   Ct Aspiration  Result Date: 09/30/2016 INDICATION: 82 year old female with a history of gynecologic malignancy status post operative resection. She has an enlarging but relatively simple appearing fluid collection in the right pelvis causing mass effect on the bladder. She presents for ultrasound-guided aspiration EXAM: CT GUIDANCE NEEDLE PLACEMENT MEDICATIONS: The patient is currently admitted to the hospital and receiving intravenous antibiotics. The antibiotics were administered within an appropriate time frame prior to the initiation of the procedure. ANESTHESIA/SEDATION: Fentanyl 50 mcg IV; Versed 2 mg IV Moderate Sedation Time:  12 minutes The patient was continuously monitored during the procedure by the interventional radiology nurse under my direct supervision. COMPLICATIONS: None immediate. PROCEDURE: Informed written consent was obtained from the patient after a thorough discussion of the procedural risks, benefits and alternatives. All questions were addressed. A timeout was performed prior to the initiation of the procedure. A planning axial CT scan was performed. The fluid collection in the right lower quadrant pelvic sidewall was successfully identified. A suitable skin entry site was selected and marked. Local and is current that the skin was prepped and draped in standard fashion using chlorhexidine skin prep. Local anesthesia was attained by infiltration with 1% lidocaine. A small dermatotomy was made. A 5 French 15 cm Yueh centesis catheter was then advanced under intermittent CT guidance into the fluid collection. Aspiration yields simple clear yellow serous fluid. A total of 90 mL was aspirated. The Yueh centesis catheter was removed. The cyst is significantly decompressed. There is decreased mass effect on the bladder. There is a  smaller bilobed fluid collection more inferiorly just posterior to the iliac vessels. The patient tolerated the procedure well. IMPRESSION: 1. Successful CT-guided aspiration of right pelvic fluid collection. Approximately 90 mL yellow serous fluid was aspirated. Samples were sent for culture and cytology. Electronically Signed   By: Jacqulynn Cadet M.D.   On: 09/30/2016 17:18    Impression:  The patient is recovering from the effects of radiation. No evidence of recurrence at this time. Cytology from aspirate showed no evidence of malignancy.  Plan:  The patient will follow up with Dr. Alycia Rossetti in 2 months, and with me in 5 months. I encouraged her to contact me with any questions or concerns that may arise in the interim. The patient was given a vaginal dilator with instructions on its use, in light of her radiation treatment to the vaginal cuff.  ____________________________________  Blair Promise, PhD, MD  This document serves as a record of services personally performed by Gery Pray, MD. It was created on his behalf by Maryla Morrow, a trained medical scribe. The creation of this record is based on the scribe's personal observations and the provider's statements to them. This document has been checked and approved by the attending provider.

## 2016-10-09 NOTE — Progress Notes (Signed)
Kayla Guerrero is here for follow up after HDR treatment.  She reports having pain in her lower back and legs at a 2/10.  She said she has had the pain since chemo and radiation.  She reports having urinary frequency that started she said has worsened with chemo and radiation.  She denies having any bowel issues.or vaginal bleeding.  She reports feeling fatigued.  She will have chemo today.  She also had an aspiration of right pelvic fluid on 09/30/16.  She has been given a size S+ and M vaginal dilator.  BP (!) 141/69 (BP Location: Right Arm, Patient Position: Sitting)   Pulse 91   Temp 98 F (36.7 C) (Oral)   Ht 5\' 6"  (1.676 m)   Wt 178 lb 3.2 oz (80.8 kg)   SpO2 98%   BMI 28.76 kg/m    Wt Readings from Last 3 Encounters:  10/09/16 178 lb 3.2 oz (80.8 kg)  09/30/16 175 lb (79.4 kg)  09/25/16 180 lb 3.2 oz (81.7 kg)

## 2016-10-09 NOTE — Progress Notes (Signed)
  Home Care Instructions for the Insertion and Care of Your Vaginal Dilator  Why Do I Need a Vaginal Dilator?  Internal radiation therapy may cause scar tissue to form at the top of your vagina (vaginal cuff).  This may make vaginal examinations difficult in the future. You can prevent scar tissue from forming by using a vaginal dilator (a smooth plastic rod), and/or by having regular sexual intercourse.  If not using the dilator you should be having intercourse two or three times a week.  If you are unable to have intercourse, you should use your vaginal dilator.  You may have some spotting or bleeding from your dilator or intercourse the first few times. You may also have some discomfort. If discomfort occurs with intercourse, you and your partner may need to stop for a while and try again later.  How to Use Your Vaginal Dilator  - Use the dilator three times a week for 10 minutes (for example: Monday, Wednesday and Friday) - Wash the dilator with soap and water before and after each use. - Check the dilator to be sure it is smooth. Do not use the dilator if you find any roughspots. - Coat the dilator with K-Y Jelly, Astroglide, or Replens. Do not use Vaseline, baby oil, or other oil based lubricants. They are not water-soluble and can be irritating to the tissues in the vagina. - Lie on your back with your knees bent and legs apart. - Insert the rounded end of the dilator into your vagina as far as it will go without causing pain or discomfort. - Close your knees and slowly straighten your legs. - Keep the dilator in your vagina for about 10 to 15 minutes. [facility] your knees, open your legs, and gently remove the dilator. - Gently cleanse the skin around the vaginal opening. - Wash the dilator after each use. -  It is important that you use the dilator routinely until instructed otherwise by your doctor.

## 2016-10-09 NOTE — Progress Notes (Signed)
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 Patient has had 3 cycles of carbo taxol beginning 06-11-16 and one cycle of single agent carboplatin on 08-22-16. Treatment was poorly tolerated, including orthostatic hypotension with multiple syncopal episodes, ongoing peripheral neuropathy  in hands interfering with function, and cytopenias.  Additionally, she appears to have very symptomatic low back problems not related to the cancer. After discussion today, we have decided not to attempt completing 2 additional cycles of chemotherapy. She saw Dr Sondra Come also today (10-20-16) and will see him 3 months after Dr Alycia Rossetti. She will see Dr Alycia Rossetti on 11-26-16.  She saw Dr Tomi Bamberger in 07-2016 during the problems with syncope from dehydration/ orthostatic hypotension. Interestingly, neither patient or husband recall this visit during our discussion now.  CT AP 09-23-16 showed increased right pelvic lymphocyst, which was aspirated by IR on 09-30-16 for 90 cc fluid, cytology negative ([id number]) and culture negative. Unfortunately the lymphocyst aspiration did not improve any of the LE discomfort.   She saw Dr Tonita Cong 10-03-16 for low back to LE pain. I have spoken to that office now, confirmed that Dr Tonita Cong would like her to see Dr Nelva Bush, and set up appointment with Dr Nelva Bush for 10-14-16.  Patient continues to feel very badly from low back pain radiating to legs, which is worse at night and thru first half of day. Sleeping in recliner, neurontin and muscle relaxants have not been helpful. She dislikes taking percocet, tho can sleep with that. She has not been able to go to AM water aerobics  classes due to degree of discomfort in AMs. The water exercise seems helpful and , as she and husband have done these classes x years, I have suggested they go to pool later in day and go thru the routines themselves.   She has continuing neuropathy in hands, R>L, which has been since chemo, which does interfere with fine motor activities to some degree.  She is fatigued from lack of sleep. She continues to stay well hydrated and to wear support stockings, has had no further syncopal episodes. Appetite is adequate, bowels are moving, no SOB or other respiratory symptoms, no abdominal or pelvic pain, no bladder symptoms, no problems with PAC, no LE swelling, no bleeding. No HA.  Remainder of 14 point Review of Systems negative.   PAC placed by IR 08-11-16 Flu vaccine 06-23-16 No genetics testing  ONCOLOGIC HISTORY Patient presented to PCP with new vaginal discharge and some vaginal spotting, referred to Dr Dory Horn, whom she had known previously. D&C on 04-09-16 had high grade carcinoma favoring serous histology [phone]). She was referred to gyn oncology, saw Dr Denman George on 04-23-16. CT CAP 04-30-16 no evidence of metastatic disease, no pelvic adenopathy, no ascites, thickened endometrial canal. On 05-06-16 Dr Alycia Rossetti performed robotic hysterectomy BSO bilateral pelvic and paraaortic node evaluation (failed sentinel LN mapping); patient was DC home on 05-07-16. 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 Post operative course was remarkable for acute onset left groin and thigh pain ~ 6 days post op, with repeat CT 05-16-16 without  findings specific for those complaints; she was treated with neurontin and percocet, tapering now as symptoms have progressively improved. First carboplatin taxol given 06-11-16, neutropenic with ANC 0.9 on day 9 cycle  1, granix added. She had several syncopal episodes around cycle 3, ED evaluations then. Cycle 4 was delayed a week for leukopenia and given as carboplatin only, with gCSF and additional IVF. Cycle 5 was delayed a week for neutropenia, then delayed for low back/ LE pain. Chemo DCd due to multiple issues on 10-09-16, after 4 cycles. HDR x 5 to vaginal cuff, 30 Gy from11-8-17 thru 09-03-16. CT AP 09-23-16 had no adenopathy or concerns for residual or progressive gyn cancer, tho right pelvic lymphocyst was enlarged. With back and leg pain, the lymphocyst was aspirated by IR for 90 cc on 09-30-16, cytology negative ([id number]) and culture negative; no change in other symptoms with aspiration of the lymphocyst.   Objective:  Vital signs in last 24 hours: Weight stable at 178, BMI 28.9, 167/82, 79 regular, 18 not labored RA, 97.4, O2 sat 98%  Alert, oriented and cooperative, tearful at times during conversation. Looks mildly uncomfortable sitting.  Ambulatory slowly. Alopecia  HEENT:PERRL, sclerae not icteric. Oral mucosa moist without lesions, posterior pharynx clear.  Neck supple. No JVD.  Lymphatics:no supraclavicular adenopathy Resp: clear to auscultation bilaterally and normal percussion bilaterally Cardio: regular rate and rhythm. No gallop. GI: soft, nontender, not distended, no mass or organomegaly. Normally active bowel sounds. Surgical incisions not remarkable. Musculoskeletal/ Extremities: Some arthritic changes in hands.  LE without pitting edema, cords, tenderness. Back not acutely tender along spine. Neuro: Speech fluent and appropriate. Decreased light touch digits of hands bilaterally. PSYCH: tearful as she describes ongoing problems with low back and legs Skin without rash, ecchymosis, petechiae Portacath-without erythema or tenderness  Lab Results:  Results for orders placed or performed during the hospital encounter of 09/30/16  Aerobic/Anaerobic Culture (surgical/deep wound)   Result Value Ref Range   Specimen Description FLUID RIGHT    Special Requests RIGHT PELVIC FLUID COLLECTION IN SYRINGE    Gram Stain      RARE WBC PRESENT,BOTH PMN AND MONONUCLEAR NO ORGANISMS SEEN    Culture No growth aerobically or anaerobically.    Report Status 10/05/2016 FINAL   APTT upon arrival  Result Value Ref Range   aPTT 25 24 - 36 seconds  CBC upon arrival  Result Value Ref Range   WBC 2.6 (L) 4.0 - 10.5 K/uL   RBC 3.57 (L) 3.87 - 5.11 MIL/uL   Hemoglobin 11.3 (L) 12.0 - 15.0 g/dL   HCT 33.3 (L) 36.0 - 46.0 %   MCV 93.3 78.0 - 100.0 fL   MCH 31.7 26.0 - 34.0 pg   MCHC 33.9 30.0 - 36.0 g/dL   RDW 15.9 (H) 11.5 - 15.5 %   Platelets 161 150 - 400 K/uL  Protime-INR upon arrival  Result Value Ref Range   Prothrombin Time 13.2 11.4 - 15.2 seconds   INR 1.00     CBC today has WBC 4.3, ANC 2.6, Heg 11.8, and plt 249, all improved CMET normal with exception of albumin 33 and EGFR 66   Studies/Results:  Right pelvic lymphocyst aspiration  Patient: ISOBELLA, ASCHER Collected: 09/30/2016 Client: [facility] Accession: [id number] Received: 09/30/2016 Jacqulynn Cadet, MD  Adequacy Reason Satisfactory For Evaluation. Diagnosis SOFT TISSUE, FINE NEEDLE ASPIRATION, RIGHT ADNEXAL CYST (SPECIMEN 1 OF 1 COLLECTED 09-30-2016) NO MALIGNANT CELLS IDENTIFIED.   Medications: I have reviewed the patient's  current medications.  DISCUSSION Reviewed findings from aspiration of the right pelvic lymphocyst, fortunately no other problems including cancer with that, tho unfortunately the aspiration did not improve any symptoms.  Patient and husband agree that she is just not able to continue full planned 6 cycles of adjuvant chemotherapy, tho we are glad that she did 4 cycles. Husband is more concerned about recurrence without 6 cycles adjuvant, but agrees with plan given all of situation. Counts are improving already and hopefully the peripheral neuropathy related to chemo  will improve given more time out from treatment.  Per my discussion with gyn oncology now, patient will see Dr Alycia Rossetti on 11-26-16. She will have CT AP repeated prior to that visit, PAC flush either with CT or with Dr Elenora Gamma appointment. Unless Dr Alycia Rossetti prefers all follow up by gyn oncology/ radiation oncology from there, she should see Dr Alvy Bimler at least when [facility] is due flush again ~ April. Dr Sondra Come will see her 3 months after Dr Alycia Rossetti   Discussed PAC, which is not bothersome to her and probably best to keep at least for next several months, as peripheral access is very difficult for her. She and husband understand that PAC should be flushed every 6-8 weeks when not otherwise used.   She and husband have been given written and oral instructions for the appointment with Dr Nelva Bush upcoming. I have encouraged her to let him try whatever he thinks might be helpful.  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 She has had total 4 cycles from 06-11-16 thru 08-22-16, first 3 carbo taxol and last [place] only. She will see Dr Alycia Rossetti with repeat CT AP late Feb, then  Dr Alvy Bimler in ~ April to include management of PAC. Dr Sondra Come will see her 3 months after Dr Alycia Rossetti.  Treatment has been TAH, BSO and pelvic/ paraaortic node evaluation on 05-06-16. Adjuvant carboplatin taxol 06-11-16 thru 08-22-16, cycle 4  carbo only, tolerated very poorly.Vaginal brachytherapy completed by Dr Sondra Come 09-04-16.  2.[facility]opal [facility], dehydration and vasovagal.Home BP monitoring and Teds/ oral hydration continuing. Heldtaxol cycle 4, additional IVF.  [email or web address] of intense pain in legs and low back/ sacral area, interfering with sleep and other functioning.  History is not typical for taxol/ gCSF pain. Degenerative changes and scoliosis LS. Seen by Dr Tonita Cong, and to see Dr  Nelva Bush on 10-14-16. 4.. Severe neuropathic pain left inguinal area postoperatively, no clear etiology on repeat CT AP then. Improved. Off neurontin. 5.chemo peripheral neuropathy hands particularly and may also be in feet: interfering with fine motor activities and noticeable with ambulation, not worse with [place] only cycle 4. Neurontin not helpful.  6.chemo neutropenia: required granix support, tolerated poorly due to bone pain 7.hypothyroid on replacement by PCP 8.post cholecystectomy and remote BTL, past GERD 9.Up to date mammograms and colonoscopy 10.Atherosclerotic thoracic and abdominal aorta by CT 11.intentional weight loss in past year, ~ 40 lbs, weight stable andbetter hydrated now. 12..Indeterminate right renal lesion on CT 13.flu vaccine 06-23-16  14.PAC in. Can be used and flushed with CT, or flush with Dr Elenora Gamma apt in Feb, then every 6-8 weeks. Difficult peripheral IV access, which was emotionally intolerable for patient, so probably best to keep at least for next few months.    All questions answered and patient/ husband are in agreement with recommendations and plans. They can call at any  time if needed prior to scheduled appointments. Time spent 25 min including >50% counseling and coordination of care. Route Dr Tomi Bamberger, cc Drs Alycia Rossetti, Mila Palmer, MD   10/09/2016, 9:38 AM

## 2016-10-10 ENCOUNTER — Ambulatory Visit: Payer: Medicare Other

## 2016-10-11 ENCOUNTER — Ambulatory Visit: Payer: Medicare Other

## 2016-10-12 ENCOUNTER — Encounter: Payer: Self-pay | Admitting: Oncology

## 2016-10-12 NOTE — Progress Notes (Signed)
[place] END OF TREATMENT   Name: Kayla Guerrero Date: October 12, 2016  MRN: [id number] DOB: 1935/06/21   TREATMENT DATES:  06-11-16 thru 08-22-16   REFERRING PHYSICIAN: Nancy Marus  DIAGNOSIS: serous carcinoma of endometrium  STAGE AT START OF TREATMENT: IB   INTENT  curative   DRUGS OR REGIMENS GIVEN: carbo taxol   MAJOR TOXICITIES: dehydration with orthostatic hypotension and syncope, cytopenias, taxol aches, granix pain, peripheral neuropathy   REASON TREATMENT STOPPED: unable to tolerate   PERFORMANCE STATUS AT END: ECOG 2   ONGOING PROBLEMS: peripheral neuropathy and comorbid conditions   FOLLOW UP PLANS: CT AP prior to gyn oncology 10-2016. Radiation oncology and medical oncology follow up

## 2016-10-13 ENCOUNTER — Ambulatory Visit: Payer: Medicare Other

## 2016-10-13 ENCOUNTER — Telehealth: Payer: Self-pay | Admitting: Oncology

## 2016-10-13 NOTE — Telephone Encounter (Signed)
lvm to inform pt that provider has cxled inj appts on 1/15 and 1/16 per LOS.

## 2016-10-14 ENCOUNTER — Ambulatory Visit: Payer: Medicare Other

## 2016-10-29 ENCOUNTER — Ambulatory Visit (INDEPENDENT_AMBULATORY_CARE_PROVIDER_SITE_OTHER): Payer: Medicare Other | Admitting: Family Medicine

## 2016-10-29 ENCOUNTER — Encounter: Payer: Self-pay | Admitting: Family Medicine

## 2016-10-29 VITALS — BP 110/72 | HR 100 | Ht 66.0 in | Wt 182.2 lb

## 2016-10-29 DIAGNOSIS — F320 Major depressive disorder, single episode, mild: Secondary | ICD-10-CM

## 2016-10-29 DIAGNOSIS — R609 Edema, unspecified: Secondary | ICD-10-CM

## 2016-10-29 DIAGNOSIS — T451X5A Adverse effect of antineoplastic and immunosuppressive drugs, initial encounter: Secondary | ICD-10-CM | POA: Diagnosis not present

## 2016-10-29 DIAGNOSIS — M21372 Foot drop, left foot: Secondary | ICD-10-CM | POA: Diagnosis not present

## 2016-10-29 DIAGNOSIS — G620 Drug-induced polyneuropathy: Secondary | ICD-10-CM | POA: Diagnosis not present

## 2016-10-29 MED ORDER — DULOXETINE HCL 20 MG PO CPEP: ORAL_CAPSULE | ORAL | 1 refills | Status: DC

## 2016-10-29 NOTE — Patient Instructions (Addendum)
  Contact Drs. Beane and/or Ramos to let them know about the new foot drop. Let them know that you have been started on Cymbalta (duloxetine)--in case they prefer you to hold off on starting until after the injection.  There is slight increased risk of bleeding on this medication, but not usually significant, and this medication isn't stopped for procedures when people have been on it a while (can't stop it abruptly).  This medication should help both with moods and with neuropathy/pain. Start taking 1 capsule in the morning.  After a week, if tolerating without significant side effects, you may increase to 2 capsules (40mg ).  Don't increase if you are having ongoing side effects (increased anxiety, headache, nausea, etc). You may use lorazepam, if needed, to treat any increase in anxiety that sometimes occurs when it is first started.  Please let us know if you cannot tolerate this medication.  Consider trying tramadol (discuss with Dr. Nelva Bush) for any severe pain, as an alternative to oxycodone.  If any recurrent leg/ankle swelling--keep them elevated, and limit the sodium in your diet.  I recommend getting the new shingles vaccine (Shingrix) when available. You will need to check with your insurance to see if it is covered, and if covered by Medicare Part D, you need to get from the pharmacy rather than our office.  It is a series of 2 injections, spaced 2 months apart.

## 2016-10-29 NOTE — Progress Notes (Signed)
Chief Complaint  Patient presents with  . Foot Swelling    ankle swelling 3-4 days that resolved. Her left foot flops. Her legs are very painful and her right hand as well. Cries a lot and would like to discuss this as well.     Chemo was stopped recently due to intolerance (neuropathy, orthostasis, including syncope, and cytopenias). She has some ongoing peripheral neuropathy (hands R>L and feet)  She had tried gabapentin for neuropathy, but it didn't help.  Only got up to 100mg  TID of gabapentin, which wasn't effective.  Denies any side effects.  She had also been given 0.5mg  lorazepam to use prn nausea, and also for anxiety--she used it sometimes to sleep, and was effective. She hasn't taken this in a while. CT is planned prior to gyn onc f/u in February.  She only more recently started having some back pain, and 10 days ago noted left foot drop.  This developed AFTER seeing Drs. Beane and Ramos. She saw Dr. Tonita Cong, who sent her to Dr. Nelva Bush. She has started PT, and plans for epidural injection next week. Low back pain radiating to legs Pain interferes with sleep.  Oxycodone helps her sleep a few hours, but worsens the constipation, so doesn't take it unless pain is severe.   She is taking Aleve instead, only a few times/week. Not taking tylenol, didn't really help. Never tried tramadol.  Seeing Dr. Anne Fu PA tomorrow for her knee pain, hoping for injection.  Knee pain has been gradually worsening.  Hard to pinpoint when she started feeling more depressed.  Up and down over the last 6 months, worse in the last month. Crying frequently.  Appetite is fine. Decrease energy, feels weak--mainly in her legs, hard to walk. No SI, HI. Sleeps fine at night with melatonin.  Wakes up frequently to void, and also from pain.  [facility] and [facility] this [facility] Feels good in afternoon.  Nighttime and mornings are rougher.  She originally made this appointment with complaint of ankle  swelling, which was bilateral, starting 3-4 days ago.  She reports this iimproved overnight, gone today. Hadn't been moving around that much. Drinking a lot of propel. Denies other sodium-rich foods.  PMH, PSH, SH reviewed  Outpatient Encounter Prescriptions as of 10/29/2016  Medication Sig Note  . Alpha-Lipoic Acid 600 MG CAPS Take 1 capsule by mouth daily.   [name] b complex vitamins capsule Take 1 capsule by mouth daily.   . Cholecalciferol (VITAMIN D) 2000 UNITS tablet Take 2,000 Units by mouth daily.   [name] glucosamine-chondroitin 500-400 MG tablet Take 1 tablet by mouth 2 (two) times daily.    [name] levothyroxine (SYNTHROID, LEVOTHROID) 88 MCG tablet TAKE ONE TABLET BY MOUTH ONCE DAILY BEFORE BREAKFAST   . lidocaine-prilocaine (EMLA) cream  10/09/2016: Received from: External Pharmacy  . Magnesium 400 MG CAPS Take 1 capsule by mouth 2 (two) times daily.    [name] MELATONIN PO Take 1 tablet by mouth at bedtime as needed.   . Multiple Vitamin (MULTI-VITAMIN) tablet Take 1 tablet by mouth daily.     . naproxen sodium (ANAPROX) 220 MG tablet Take 220 mg by mouth 2 (two) times daily as needed (pain).    . polyethylene glycol (MIRALAX / GLYCOLAX) packet Take 17 g by mouth 2 (two) times daily as needed for mild constipation.    . senna-docusate (SENOKOT S) 8.6-50 MG tablet Take 1 tablet by mouth 2 (two) times daily as needed for mild constipation.   [name]  Wheat Dextrin (BENEFIBER DRINK MIX PO) Take 1 scoop by mouth 2 (two) times daily.    . DULoxetine (CYMBALTA) 20 MG capsule Take 1 capsule by mouth daily.  Increase to 2 capsules after a week if tolerated   . oxyCODONE-acetaminophen (PERCOCET/ROXICET) 5-325 MG tablet Take 1-2 tablets by mouth every 4 (four) hours as needed (moderate to severe pain). (Patient not taking: Reported on 10/29/2016)   . Red Yeast Rice Extract (RED YEAST RICE PO) Take 1-2 tablets by mouth 2 (two) times daily. Take 1 tablet in the morning and then take 2 tablets with dinner   .  [DISCONTINUED] loratadine (CLARITIN) 10 MG tablet Take 10 mg by mouth daily as needed for allergies.   . [DISCONTINUED] terbinafine (LAMISIL) 250 MG tablet Take 1 tablet (250 mg total) by mouth daily. (Patient taking differently: Take 250 mg by mouth every other day. )    Facility-Administered Encounter Medications as of 10/29/2016  Medication  . influenza  inactive virus vaccine (FLUZONE/FLUARIX) injection 0.5 mL   (cymbalta prescribed today, not prior to visit).  Allergies  Allergen Reactions  . Levaquin [Levofloxacin Hemihydrate] Other (See Comments)  . Promethazine Hcl Other (See Comments)    fainting  . Sulfa Drugs Cross Reactors Swelling   ROS: no fever, chills, headache, dizziness, chest pain, nausea, bowel changes (constipated when takes pain pills), bleeding, bruising, rash.  +pain in hands, feet, back, leg.  +foot drop on left.  +depression, drying. No urinary complaints or other concerns, except as noted in HPI.   PHYSICAL EXAM:  BP 110/72 (BP Location: Right Arm, Patient Position: Sitting, Cuff Size: Normal)   Pulse 100   Ht 5\' 6"  (1.676 m)   Wt 182 lb 3.2 oz (82.6 kg)   BMI 29.41 kg/m   Elderly female, with scarf on head, intermittently in good spirits, and frequently crying.  She is accompanied by her very supportive husband. HEENT: conjunctiva and sclera are clear, OP is clear Neck: no lymphadenopathy or mass Heart: regular rate and rhythm (not tachycardic at time of exam) Lungs: clear bilaterally Back: spine, SI and CVA all nontender. No muscle tenderness or spasm Extremities: no edema, normal pulses Neuro: alert and oriented. DTR's 2+ and symmetric. 3/5 strength in dorsiflexion on the left, normal on the right.  Unable to walk on heels with either foot.  Normal toe walking. Psych: full range of affect--smiling/laughing, and crying during the visit.  When crying, was very emotional--very unlike her normal affect. Normal hygiene, grooming, eye contact and  speech  ASSESSMENT/PLAN:  Chemotherapy-induced peripheral neuropathy ([place]) - start cymbalta--to help with neuropathy/pain and also with moods. May benefit from re-trying cymbalta in future if not adequately treated with cymbalta alone - Plan: DULoxetine (CYMBALTA) 20 MG capsule  Depression, major, single episode, mild ([place]) - encouraged counseling (look into through cancer center); start cymbalta 20mg , increase to 30mg  after a week if tolerated - Plan: DULoxetine (CYMBALTA) 20 MG capsule  Foot drop, left - new onset, in the setting of lumbar radiculopathy that is being evaluated by ortho, epidural planned  Peripheral edema - resolved; discussed leg elevation, compression sock, limiting sodium intake if ongoing (hasn't liited due to low bp's)   Counseled re: depression, rec counseling; counseled re: expectations/normal course and risks/side effects of medications. She originally was scheduled for med check next week--change to f/u in 5-6 weeks on depression/med check.   Contact Drs. Beane and/or Ramos to let them know about the new foot drop. Let them know that you have  been started on Cymbalta (duloxetine)--in case they prefer you to hold off on starting until after the injection.  There is slight increased risk of bleeding on this medication, but not usually significant, and this medication isn't stopped for procedures when people have been on it a while (can't stop it abruptly).  This medication should help both with moods and with neuropathy/pain. Start taking 1 capsule in the morning.  After a week, if tolerating without significant side effects, you may increase to 2 capsules (40mg ).  Don't increase if you are having ongoing side effects (increased anxiety, headache, nausea, etc). You may use lorazepam, if needed, to treat any increase in anxiety that sometimes occurs when it is first started.  Please let us know if you cannot tolerate this medication.  Consider trying tramadol  (discuss with Dr. Nelva Bush) for any severe pain, as an alternative to oxycodone.  If any recurrent leg/ankle swelling--keep them elevated, and limit the sodium in your diet.

## 2016-11-06 ENCOUNTER — Encounter: Payer: Medicare Other | Admitting: Family Medicine

## 2016-11-19 ENCOUNTER — Ambulatory Visit (HOSPITAL_COMMUNITY)
Admission: RE | Admit: 2016-11-19 | Discharge: 2016-11-19 | Disposition: A | Discharge status: Home / Self Care | Payer: Medicare Other | Source: Ambulatory Visit | Attending: Gynecologic Oncology | Admitting: Gynecologic Oncology

## 2016-11-19 ENCOUNTER — Encounter (HOSPITAL_COMMUNITY): Payer: Self-pay

## 2016-11-19 DIAGNOSIS — K449 Diaphragmatic hernia without obstruction or gangrene: Secondary | ICD-10-CM | POA: Insufficient documentation

## 2016-11-19 DIAGNOSIS — K5730 Diverticulosis of large intestine without perforation or abscess without bleeding: Secondary | ICD-10-CM | POA: Insufficient documentation

## 2016-11-19 DIAGNOSIS — C541 Malignant neoplasm of endometrium: Secondary | ICD-10-CM | POA: Diagnosis present

## 2016-11-19 DIAGNOSIS — I700 Atherosclerosis of aorta: Secondary | ICD-10-CM | POA: Diagnosis not present

## 2016-11-19 DIAGNOSIS — Z9049 Acquired absence of other specified parts of digestive tract: Secondary | ICD-10-CM | POA: Diagnosis not present

## 2016-11-19 DIAGNOSIS — I70208 Unspecified atherosclerosis of native arteries of extremities, other extremity: Secondary | ICD-10-CM | POA: Insufficient documentation

## 2016-11-19 MED ORDER — HEPARIN SOD (PORK) LOCK FLUSH 100 UNIT/ML IV SOLN
INTRAVENOUS | Status: AC
  Administered 2016-11-19: 500 [IU]
  Filled 2016-11-19: qty 5

## 2016-11-19 MED ORDER — SODIUM CHLORIDE 0.9 % IJ SOLN
INTRAMUSCULAR | Status: AC
  Filled 2016-11-19: qty 50

## 2016-11-19 MED ORDER — IOPAMIDOL (ISOVUE-300) INJECTION 61%
100.0000 mL | Freq: Once | INTRAVENOUS | Status: AC | PRN
  Administered 2016-11-19: 100 mL via INTRAVENOUS

## 2016-11-19 MED ORDER — SODIUM CHLORIDE 0.9 % IV SOLN
INTRAVENOUS | Status: DC | PRN
  Administered 2016-11-19: 11:00:00

## 2016-11-19 MED ORDER — IOPAMIDOL (ISOVUE-300) INJECTION 61%
INTRAVENOUS | Status: AC
  Filled 2016-11-19: qty 100

## 2016-11-25 NOTE — Progress Notes (Signed)
Consult Note: Gyn-Onc  Consult was requested by Dr. Nori Riis for the evaluation of Kayla Guerrero 82 y.o. female  CC:  Chief Complaint  Patient presents with  . endometrial cancer    Assessment/Plan:  [name]  is a 82 y.o.  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 Her postoperative case was complicated by neuropathic pain most likely from a lymph node dissection but that is resolved now. She has a persistent lymphcyst that has been previously drained and was negative for malignancy. Pros and cons of re-aspiration and prolonged drainage with sclerosis was discussed with them. I would be in favor of conservative follow up as she is asymptomatic at this time.  She will follow up with Dr. Alvy Bimler 01/08/17; Dr. Sondra Come 03/12/17 and will return to see me in August.  I will contact Dr. Nelva Bush to see if draining the fluid collection on the left could potentially contribute to improving her foot drop. I will notify her of his opinion on this matter.   HPI: Kayla Guerrero is an extremely pleasant 82 year old G2P2 who was seen in consultation at the request of Dr Nori Riis for high grade (serous) endometrial cancer.   The patient reports postmenopausal spotting since July, 2017. She was seen by Dr Nori Riis for this who performed a TVUS on 04/08/16 which revealed a 7.3x4x5.2cm uterus with a 3.3cm irregular mass within the endometrium. The ovaries were normal.   A hysteroscopy and D&C was performed on 04/09/16 with several polypoid structures seen and removed. 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   05/06/16: Surgery: Total robotic hysterectomy, bilateral salpingo-oophorectomy, bilateral pelvic and para-aortic lymph node dissection. Failed SLN mapping.  Complications: None   Pathology: Uterus, cervix, bilateral tubes and ovaries, bilateral pelvic and para-aortic lymph nodes  to pathology.   Operative findings: Normal cervix, normal appearing uterus and adnexa. Adhesions of the colon to the left pelvic sidewall. Omentum adherent to LUQ. No SLN mapping. No enlarged lymph nodes  Diagnosis 1. Lymph node, biopsy, right peri-aortic - ONE OF ONE LYMPH NODES NEGATIVE FOR MALIGNANCY (0/1). 2. Lymph node, biopsy, left peri-aortic - ONE OF ONE LYMPH NODES NEGATIVE FOR MALIGNANCY (0/1). 3. Lymph node, biopsy, right pelvic - FOUR OF FOUR LYMPH NODES NEGATIVE FOR MALIGNANCY (0/4). 4. Lymph node, biopsy, left pelvic - FIVE OF FIVE LYMPH NODES NEGATIVE FOR MALIGNANCY (0/5). 5. Uterus +/- tubes/ovaries, neoplastic - UTERUS: -ENDO/MYOMETRIUM: INVASIVE MIXED ENDOMETRIOID AND SEROUS CARCINOMA, SPANNING 4 CM. TUMOR INVADES OUTER HALF OF MYOMETRIUM. LYMPHOVASCULAR INVASION PRESENT. SEE ONCOLOGY TABLE. LEIOMYOMA. -SEROSA: UNINVOLVED. NO MALIGNANCY. - CERVIX: BENIGN SQUAMOUS AND ENDOCERVICAL MUCOSA. NO DYSPLASIA OR MALIGNANCY. - BILATERAL OVARIES: INCLUSION CYSTS. NO MALIGNANCY. - BILATERAL FALLOPIAN TUBES: UNREMARKABLE. NO MALIGNANCY.  She did very well postoperatively until August 12 when she went out to lunch and since that time noticed some which she describes excruciating pain. She was seen in our clinic and secondary concerns were possible lymphocyst or seroma causing pressure on the femoral nerves and radiculopathy she had a CT scan was ordered which revealed:  CT 05/16/16: IMPRESSION: 1. Extensive subcutaneous emphysema about the abdomen and pelvis, likely postoperative. Probable abdominal pelvic wall small volume hematomas, as above. 2. Right pelvic sidewall fluid collection is likely a seroma or lymphangioma. No explanation for left lower extremity pain. 3. Minimal ill-defined fluid in the presacral space and left adnexa. 4. Small hiatal hernia. 5. **An incidental finding  of potential clinical significance has been found. Indeterminate right renal lesion is similar  to on the recent exam. Consider further evaluation with dedicated outpatient pre and post contrast abdominal MRI.**  I last saw her on August 30. At that time she was feeling somewhat better. She was last seen by Dr. Marko Plume on October 2. She had received her first cycle of Taxol and carboplatin on 06/11/2016. She became neutropenic with an ANC of 0.9 on day #9 of cycle #1. She did complain of some pain in her joints. The nerve pain that she complained of after surgery is improved and she's no longer taking gabapentin. She has some has some issues with constipation. She does have some neuropathy in her toes. She states that she had some component of this pre-chemotherapy and is slightly worse limited to her feet she has not in her fingers.  Interval History: She had three cycles of chemotherapy from 9/13-11/24. IT was stopped due to inability to tolerate. She was able to complete HDR x5 from 11/8-12/6/17.  She had a follow up CT that revealed: IMPRESSION: 1. No CT findings to suggest recurrent tumor, lymphadenopathy or metastatic disease. 2. Stable right-sided pelvic sidewall cyst with mass effect on the bladder. 3. Stable advanced atherosclerotic calcifications involving the aorta and iliac arteries. 4. Moderate size hiatal hernia. 5. Status post cholecystectomy with stable intra and extrahepatic biliary dilatation.  She had the fluid collection drained 09/30/16 and it was negative for malignancy. However, it appears to have reaccumulated. She was aware of the results that she looked them up on Epic. She has been diagnosed with left-sided foot drop. She's been seen by orthopedics and has had injections in her back of steroids by Dr. Nelva Bush pain. She's been doing. Physical therapy for the past 4 weeks but is not been quite as diligent as she thought. She is not exactly clear with the area etiology of the foot drop is. She is able to do some water aerobics. She has gained approximately 12 pounds and  admits to being more sedentary and eating more. She continues to have neuropathy in her fingers right hand greater than left. She thinks that the neuropathy on the tip of her index finger on the right hand is improved with the other 3 fingers continue to have neuropathy and she occasionally has some shooting pain on the palm of her hand she is taking vitamin B 6 and alpha lipoic acid. She has contemplated and considered acupuncture which I think is fine. As stated I do not understand the etiology of her left-sided foot drop but appeared to develop later. Her husband is not sure if it's related to chemotherapy or to her back issues. I discussed with her that I have not seen that with chemotherapy previously.  Review of Systems  Constitutional: Denies fever. Skin: No rash Cardiovascular: No chest pain, shortness of breath, or edema  Gastro Intestinal:  No nausea, vomiting, constipation, or diarrhea reported.  Genitourinary: Denies vaginal bleeding and discharge.  Musculoskeletal: Shooting pains on the palm of her right hand and on the dorsum of her left foot  Neurologic: Neuropathy as above with left-sided foot drop. Psychology: Depression is markedly improved.  Current Meds:  Outpatient Encounter Prescriptions as of 11/26/2016  Medication Sig  . Alpha-Lipoic Acid 600 MG CAPS Take 1 capsule by mouth daily.  [name] b complex vitamins capsule Take 1 capsule by mouth daily.  . Cholecalciferol (VITAMIN D) 2000 UNITS tablet Take 2,000 Units by mouth daily.  [name]  DULoxetine (CYMBALTA) 20 MG capsule Take 1 capsule by mouth daily.  Increase to 2 capsules after a week if tolerated  . glucosamine-chondroitin 500-400 MG tablet Take 1 tablet by mouth 2 (two) times daily.   [name] levothyroxine (SYNTHROID, LEVOTHROID) 88 MCG tablet TAKE ONE TABLET BY MOUTH ONCE DAILY BEFORE BREAKFAST  . lidocaine-prilocaine (EMLA) cream   . Magnesium 400 MG CAPS Take 1 capsule by mouth 2 (two) times daily.   [name] MELATONIN PO Take 1 tablet  by mouth at bedtime as needed.  . Multiple Vitamin (MULTI-VITAMIN) tablet Take 1 tablet by mouth daily.    . naproxen sodium (ANAPROX) 220 MG tablet Take 220 mg by mouth 2 (two) times daily as needed (pain).   [name] oxyCODONE-acetaminophen (PERCOCET/ROXICET) 5-325 MG tablet Take 1-2 tablets by mouth every 4 (four) hours as needed (moderate to severe pain).  . polyethylene glycol (MIRALAX / GLYCOLAX) packet Take 17 g by mouth 2 (two) times daily as needed for mild constipation.   . senna-docusate (SENOKOT S) 8.6-50 MG tablet Take 1 tablet by mouth 2 (two) times daily as needed for mild constipation.  . Wheat Dextrin (BENEFIBER DRINK MIX PO) Take 1 scoop by mouth 2 (two) times daily.   . [DISCONTINUED] Red Yeast Rice Extract (RED YEAST RICE PO) Take 1-2 tablets by mouth 2 (two) times daily. Take 1 tablet in the morning and then take 2 tablets with dinner   Facility-Administered Encounter Medications as of 11/26/2016  Medication  . influenza  inactive virus vaccine (FLUZONE/FLUARIX) injection 0.5 mL    Allergy:  Allergies  Allergen Reactions  . Levaquin [Levofloxacin Hemihydrate] Other (See Comments)  . Promethazine Hcl Other (See Comments)    fainting  . Sulfa Drugs Cross Reactors Swelling    Social Hx:   Social History   Social History  . Marital status: Married    Spouse name: N/A  . Number of children: 1  . Years of education: N/A   Occupational History  . retired Librarian, academic); Artist    Social History Main Topics  . Smoking status: Former Smoker    Types: Cigarettes    Quit date: 09/29/1980  . Smokeless tobacco: Never Used  . Alcohol use 0.0 oz/week     Comment: 1 glass of wine 3-4 times/week; hard liquor (highball) or a beer once a week  . Drug use: No  . Sexual activity: Not on file   Other Topics Concern  . Not on file   Social History Narrative   Lives with husband.  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  MFA from  [employer], doing paper collage, abstract work. Son lives in [place]; granddaughter lives in [place] (with her mother--son is divorced)    Past Surgical Hx:  Past Surgical History:  Procedure Laterality Date  . CATARACT EXTRACTION, BILATERAL    . CHOLECYSTECTOMY    . COLONOSCOPY  2010, 04/2013   Buccini  . IR GENERIC HISTORICAL  08/11/2016   IR FLUORO GUIDE PORT INSERTION RIGHT 08/11/2016 Greggory Keen, MD [facility]  . IR GENERIC HISTORICAL  08/11/2016   IR US GUIDE VASC ACCESS RIGHT 08/11/2016 Greggory Keen, MD [facility]  . PTERYGIUM EXCISION    . ROBOTIC ASSISTED TOTAL HYSTERECTOMY WITH BILATERAL SALPINGO OOPHERECTOMY N/A 05/06/2016   Procedure: XI ROBOTIC ASSISTED  LAPAROSCOPIC TOTAL HYSTERECTOMY WITH BILATERAL SALPINGO OOPHORECTOMY;  Surgeon: Nancy Marus, MD;  Location: [facility];  Service: Gynecology;  Laterality: N/A;  . SENTINEL NODE BIOPSY  N/A 05/06/2016   Procedure: SENTINEL NODE BIOPSY;  Surgeon: Nancy Marus, MD;  Location: [facility];  Service: Gynecology;  Laterality: N/A;  . TUBAL LIGATION      Past Medical Hx:  Past Medical History:  Diagnosis Date  . Arthritis   . Arthritis of knee, left   . Borderline osteopenia DEXA 2006 and 2012  . Cancer [facility])    endometrial  . Cataract   . Cholesterol serum elevated   . Colon polyps   . FHx: colon cancer   . GERD (gastroesophageal reflux disease)   . History of radiation therapy 08/06/16 - 09/03/16   vaginal cuff treated to 30 Gy in 5 fractions  . Hypothyroidism   . Obesity     Past Gynecological History:  Tubal ligation, SVD x 2. No LMP recorded. Patient is postmenopausal.  Family Hx:  Family History  Problem Relation Age of Onset  . Cancer Mother 48    COLON   . Heart disease Mother   . Cancer Maternal Aunt   . Breast cancer Maternal Aunt   . Mental illness Maternal Uncle   . Cancer Maternal Grandmother     stomach  . Macular degeneration Maternal Grandmother   . Diabetes Neg Hx     Vitals:  Blood pressure (!)  149/70, pulse 86, temperature 97.7 F (36.5 C), temperature source Oral, resp. rate 18, height 5\' 6"  (1.676 m), weight 185 lb 12.8 oz (84.3 kg), SpO2 100 %.  Physical Exam:  Well-nourished well-developed female in no acute distress.  Neck: Supple, no lymphadenopathy, no thyromegaly.  Lungs: Clear to auscultation bilaterally.  Heart: Regular rate and rhythm  Abdomen: Well-healed surgical incisions. Abdomen is soft, nontender, nondistended. There is no appreciable incisional hernias.  Extremities: There is no edema.  Pelvic: External genitalia within normal limits though atrophic. The vagina is markedly atrophic. The vaginal cuff is without  lesions or discharge. There is no visible lesions. Bimanual examination reveals no thickening and nodularity or masses. There is no tenderness or fluctuance.   Staphanie Harbison A., MD  11/26/2016, 4:09 PM

## 2016-11-26 ENCOUNTER — Encounter: Payer: Self-pay | Admitting: Gynecologic Oncology

## 2016-11-26 ENCOUNTER — Ambulatory Visit: Payer: Medicare Other | Attending: Gynecologic Oncology | Admitting: Gynecologic Oncology

## 2016-11-26 VITALS — BP 149/70 | HR 86 | Temp 97.7°F | Resp 18 | Ht 66.0 in | Wt 185.8 lb

## 2016-11-26 DIAGNOSIS — E669 Obesity, unspecified: Secondary | ICD-10-CM | POA: Diagnosis not present

## 2016-11-26 DIAGNOSIS — N289 Disorder of kidney and ureter, unspecified: Secondary | ICD-10-CM | POA: Insufficient documentation

## 2016-11-26 DIAGNOSIS — I898 Other specified noninfective disorders of lymphatic vessels and lymph nodes: Secondary | ICD-10-CM

## 2016-11-26 DIAGNOSIS — K219 Gastro-esophageal reflux disease without esophagitis: Secondary | ICD-10-CM | POA: Insufficient documentation

## 2016-11-26 DIAGNOSIS — Z86010 Personal history of colonic polyps: Secondary | ICD-10-CM | POA: Diagnosis not present

## 2016-11-26 DIAGNOSIS — Z87891 Personal history of nicotine dependence: Secondary | ICD-10-CM | POA: Diagnosis not present

## 2016-11-26 DIAGNOSIS — C541 Malignant neoplasm of endometrium: Secondary | ICD-10-CM | POA: Insufficient documentation

## 2016-11-26 DIAGNOSIS — I700 Atherosclerosis of aorta: Secondary | ICD-10-CM | POA: Insufficient documentation

## 2016-11-26 DIAGNOSIS — T797XXA Traumatic subcutaneous emphysema, initial encounter: Secondary | ICD-10-CM | POA: Diagnosis not present

## 2016-11-26 DIAGNOSIS — Z90710 Acquired absence of both cervix and uterus: Secondary | ICD-10-CM | POA: Diagnosis not present

## 2016-11-26 DIAGNOSIS — G629 Polyneuropathy, unspecified: Secondary | ICD-10-CM | POA: Insufficient documentation

## 2016-11-26 DIAGNOSIS — D709 Neutropenia, unspecified: Secondary | ICD-10-CM | POA: Insufficient documentation

## 2016-11-26 DIAGNOSIS — K449 Diaphragmatic hernia without obstruction or gangrene: Secondary | ICD-10-CM | POA: Insufficient documentation

## 2016-11-26 DIAGNOSIS — K5900 Constipation, unspecified: Secondary | ICD-10-CM | POA: Diagnosis not present

## 2016-11-26 DIAGNOSIS — Z90722 Acquired absence of ovaries, bilateral: Secondary | ICD-10-CM | POA: Diagnosis not present

## 2016-11-26 DIAGNOSIS — Z9049 Acquired absence of other specified parts of digestive tract: Secondary | ICD-10-CM | POA: Diagnosis not present

## 2016-11-26 DIAGNOSIS — R2689 Other abnormalities of gait and mobility: Secondary | ICD-10-CM

## 2016-11-26 DIAGNOSIS — X58XXXA Exposure to other specified factors, initial encounter: Secondary | ICD-10-CM | POA: Diagnosis not present

## 2016-11-26 DIAGNOSIS — E039 Hypothyroidism, unspecified: Secondary | ICD-10-CM | POA: Insufficient documentation

## 2016-11-26 DIAGNOSIS — Z9889 Other specified postprocedural states: Secondary | ICD-10-CM | POA: Diagnosis not present

## 2016-11-26 DIAGNOSIS — M8580 Other specified disorders of bone density and structure, unspecified site: Secondary | ICD-10-CM | POA: Diagnosis not present

## 2016-11-26 NOTE — Patient Instructions (Signed)
Please follow up with Dr. Alvy Bimler 01/08/17; Dr. Sondra Come 03/12/17 and will return to see Dr. Alycia Rossetti in August.

## 2016-12-09 ENCOUNTER — Ambulatory Visit: Payer: Medicare Other | Admitting: Sports Medicine

## 2016-12-10 NOTE — Progress Notes (Signed)
Chief Complaint  Patient presents with  . Hypothyroidism    nonfasting med check.    Patient presents for follow-up on depression.  She was started on Cymbalta 6 weeks ago.  Moods have improved significantly. She stayed at the 24m dose, and hasn't cried since her visit here. Denies side effects. She noticed looser stools initially, not much now.  Previously had problems with constipation.  Moods are better, but she doesn't note any improvement in the neuropathy (both feet, right hand).  In the interim she has seen Dr. RNelva Bushand had epidural injection, which helped her back and leg pain.  She got fitted today for AFO.  MRI and nerve testing is planned (saw Dr. BTonita Conglast week).  She has also seen Dr. AAnne FuPA and has had knee injections (bilateral) last week, and doesn't notice any improvement. She has been going to water aerobics, but not walking as much as she used to, due to pain.  Hyperlipidemia: She used to take red yeast rice 3/day (1 in the morning, 2 at dinner) without any side effects. She hasn't taken this in about 4-5 months. She admits she hasn't been watching her diet at all--eating what she wants.  She thinks the duloxetine makes her hungrier.  Eating more sweets, bread, some more cheese.  Still doesn't eat a lot of meat (mostly chicken) or eggs. +creamy sauces, ice cream.   Lab Results  Component Value Date   CHOL 238 (H) 10/16/2015   HDL 80 10/16/2015   LDLCALC 144 (H) 10/16/2015   TRIG 71 10/16/2015   CHOLHDL 3.0 10/16/2015    Hypothyroid--denies fatigue, changes in hair/skin/bowel (just related to chemo, always has dry skin).Has some constipation, that is usually pretty controlled with daily fiber. Improved since being on duloxetine.  She takes her thyroid medication first thing when she wakes up, separate from other medications, but sometimes eats within 10-15 minutes rather than waiting 30.  Denies any missed pills. Lab Results  Component Value Date   TSH 2.903  10/16/2015   PMH, PSH, SH and visits with other doctors reviewed  Outpatient Encounter Prescriptions as of 12/11/2016  Medication Sig Note  . Alpha-Lipoic Acid 600 MG CAPS Take 1 capsule by mouth daily.   .Marland Kitchenb complex vitamins capsule Take 1 capsule by mouth daily.   . Cholecalciferol (VITAMIN D) 2000 UNITS tablet Take 2,000 Units by mouth daily.   .Marland Kitchenglucosamine-chondroitin 500-400 MG tablet Take 1 tablet by mouth 2 (two) times daily.    .Marland Kitchenlevothyroxine (SYNTHROID, LEVOTHROID) 88 MCG tablet TAKE ONE TABLET BY MOUTH ONCE DAILY BEFORE BREAKFAST   . Magnesium 400 MG CAPS Take 1 capsule by mouth 2 (two) times daily.    .Marland KitchenMELATONIN PO Take 1 tablet by mouth at bedtime as needed.   . Multiple Vitamin (MULTI-VITAMIN) tablet Take 1 tablet by mouth daily.     . Wheat Dextrin (BENEFIBER DRINK MIX PO) Take 1 scoop by mouth 2 (two) times daily.    . [DISCONTINUED] DULoxetine (CYMBALTA) 20 MG capsule Take 1 capsule by mouth daily.  Increase to 2 capsules after a week if tolerated   . DULoxetine HCl 40 MG CPEP Take 1 capsule by mouth daily.   .Marland Kitchenlidocaine-prilocaine (EMLA) cream  10/09/2016: Received from: External Pharmacy  . naproxen sodium (ANAPROX) 220 MG tablet Take 220 mg by mouth 2 (two) times daily as needed (pain).    .Marland KitchenoxyCODONE-acetaminophen (PERCOCET/ROXICET) 5-325 MG tablet Take 1-2 tablets by mouth every 4 (four) hours as  needed (moderate to severe pain). (Patient not taking: Reported on 12/11/2016)   . polyethylene glycol (MIRALAX / GLYCOLAX) packet Take 17 g by mouth 2 (two) times daily as needed for mild constipation.    . senna-docusate (SENOKOT S) 8.6-50 MG tablet Take 1 tablet by mouth 2 (two) times daily as needed for mild constipation.    Facility-Administered Encounter Medications as of 12/11/2016  Medication  . influenza  inactive virus vaccine (FLUZONE/FLUARIX) injection 0.5 mL   (duloxetine dose was 81m prior to today's visit)  Allergies  Allergen Reactions  . Levaquin  [Levofloxacin Hemihydrate] Other (See Comments)  . Promethazine Hcl Other (See Comments)    fainting  . Sulfa Drugs Cross Reactors Swelling     ROS:  Normal appetite, no weight changes, no headaches, dizziness, chest pain, shortness of breath, nausea, vomiting, abdominal pain.  Constipation is improved. No urinary complaints.  +knee pain and foot drop.  See HPI   PHYSICAL EXAM:  BP (!) 142/82 (BP Location: Left Arm, Patient Position: Sitting, Cuff Size: Normal)   Pulse 72   Ht _0  (1.676 m)   Wt 184 lb 12.8 oz (83.8 kg)   BMI 29.83 kg/m   Wt Readings from Last 3 Encounters:  12/11/16 184 lb 12.8 oz (83.8 kg)  11/26/16 185 lb 12.8 oz (84.3 kg)  10/29/16 182 lb 3.2 oz (82.6 kg)   138/80 on repeat by MD  Well appearing, pleasant female in no distress HEENT: conjunctiva and sclera are clear, EOMI, moist mucus membranes Neck: no lymphadenopathy or mass Heart: regular rate and rhythm Lungs: clear bilaterally Back: no spinal or CVA tenderness Abdomen: soft, nontender Extremities: no edema Psych: normal mood, affect, hygiene and grooming.  Normal eye contact and speech   ASSESSMENT/PLAN:  Hypothyroidism, unspecified type - euthyroid by history.  Due for TSH. Will return when fasting - Plan: TSH  Depression, major, single episode, mild (HCC) - signifiantly improved on duloxetine; titrating up dose to help with neuropathy - Plan: DULoxetine HCl 40 MG CPEP  Chemotherapy-induced peripheral neuropathy (HCC) - not improved on 268m-increased duloxetine to 4074mand then further up to 62m25mf  needed/tolerated - Plan: DULoxetine HCl 40 MG CPEP  Pure hypercholesterolemia - due for recheck--hasn't been as careful with diet or taking red yeast rice - Plan: Lipid panel   Lipids, TSH (normal c-met and CBC in January) Return for fasting labs  CPE/AWV as scheduled in September  Low sodium diet reviewed. Bring list of BPs and monitor to f/u visit  Increase to 40mg21mduloxetine.   I'm sending a new prescription for the 40mg 29m.  Feel free to try two 20mg c4mles together for a day or two to ensure that you tolerate (but don't necessarily finish out the prescription this way--leave some for further titration in the future, if needed).  Take the 40mg fo58m least 3-4 weeks (assuming that you are tolerating it).  If your neuropathy pain has not improved, then start taking 62mg tot26mthe 40mg PLUS63m 20mg capsu66m together).  If this is tolerable (no significant side effects) and if it is more effective in treating the neuropathy, then we can change over to a 62mg capsul45m

## 2016-12-11 ENCOUNTER — Ambulatory Visit (INDEPENDENT_AMBULATORY_CARE_PROVIDER_SITE_OTHER): Payer: Medicare Other | Admitting: Family Medicine

## 2016-12-11 VITALS — BP 138/80 | HR 72 | Ht 66.0 in | Wt 184.8 lb

## 2016-12-11 DIAGNOSIS — G620 Drug-induced polyneuropathy: Secondary | ICD-10-CM | POA: Diagnosis not present

## 2016-12-11 DIAGNOSIS — E039 Hypothyroidism, unspecified: Secondary | ICD-10-CM | POA: Diagnosis not present

## 2016-12-11 DIAGNOSIS — F320 Major depressive disorder, single episode, mild: Secondary | ICD-10-CM

## 2016-12-11 DIAGNOSIS — T451X5A Adverse effect of antineoplastic and immunosuppressive drugs, initial encounter: Secondary | ICD-10-CM

## 2016-12-11 DIAGNOSIS — E7800 Pure hypercholesterolemia, unspecified: Secondary | ICD-10-CM | POA: Diagnosis not present

## 2016-12-11 MED ORDER — DULOXETINE HCL 40 MG PO CPEP: 1.0000 | ORAL_CAPSULE | Freq: Every day | ORAL | 1 refills | Status: DC

## 2016-12-11 NOTE — Patient Instructions (Signed)
Increase to 40mg  of duloxetine.  I'm sending a new prescription for the 40mg  dose.  Feel free to try two 20mg  capsules together for a day or two to ensure that you tolerate (but don't necessarily finish out the prescription this way--leave some for further titration in the future, if needed).  Take the 40mg  for at least 3-4 weeks (assuming that you are tolerating it).  If your neuropathy pain has not improved, then start taking 60mg  total (the 40mg  PLUS the 20mg  capsules, together).  If this is tolerable (no significant side effects) and if it is more effective in treating the neuropathy, then we can change over to a 60mg  capsules.  Check your blood pressure once a week and record on a piece of paper. Bring this paper and your monitor to your next visit. Try and follow a low sodium diet.   DASH Eating Plan DASH stands for "Dietary Approaches to Stop Hypertension." The DASH eating plan is a healthy eating plan that has been shown to reduce high blood pressure (hypertension). It may also reduce your risk for type 2 diabetes, heart disease, and stroke. The DASH eating plan may also help with weight loss. What are tips for following this plan? General guidelines   Avoid eating more than 2,300 mg (milligrams) of salt (sodium) a day. If you have hypertension, you may need to reduce your sodium intake to 1,500 mg a day.  Limit alcohol intake to no more than 1 drink a day for nonpregnant women and 2 drinks a day for men. One drink equals 12 oz of beer, 5 oz of wine, or 1 oz of hard liquor.  Work with your health care provider to maintain a healthy body weight or to lose weight. Ask what an ideal weight is for you.  Get at least 30 minutes of exercise that causes your heart to beat faster (aerobic exercise) most days of the week. Activities may include walking, swimming, or biking.  Work with your health care provider or diet and nutrition specialist (dietitian) to adjust your eating plan to your  individual calorie needs. Reading food labels   Check food labels for the amount of sodium per serving. Choose foods with less than 5 percent of the Daily Value of sodium. Generally, foods with less than 300 mg of sodium per serving fit into this eating plan.  To find whole grains, look for the word "whole" as the first word in the ingredient list. Shopping   Buy products labeled as "low-sodium" or "no salt added."  Buy fresh foods. Avoid canned foods and premade or frozen meals. Cooking   Avoid adding salt when cooking. Use salt-free seasonings or herbs instead of table salt or sea salt. Check with your health care provider or pharmacist before using salt substitutes.  Do not fry foods. Cook foods using healthy methods such as baking, boiling, grilling, and broiling instead.  Cook with heart-healthy oils, such as olive, canola, soybean, or sunflower oil. Meal planning    Eat a balanced diet that includes:  5 or more servings of fruits and vegetables each day. At each meal, try to fill half of your plate with fruits and vegetables.  Up to 6-8 servings of whole grains each day.  Less than 6 oz of lean meat, poultry, or fish each day. A 3-oz serving of meat is about the same size as a deck of cards. One egg equals 1 oz.  2 servings of low-fat dairy each day.  A serving of  nuts, seeds, or beans 5 times each week.  Heart-healthy fats. Healthy fats called Omega-3 fatty acids are found in foods such as flaxseeds and coldwater fish, like sardines, salmon, and mackerel.  Limit how much you eat of the following:  Canned or prepackaged foods.  Food that is high in trans fat, such as fried foods.  Food that is high in saturated fat, such as fatty meat.  Sweets, desserts, sugary drinks, and other foods with added sugar.  Full-fat dairy products.  Do not salt foods before eating.  Try to eat at least 2 vegetarian meals each week.  Eat more home-cooked food and less restaurant,  buffet, and fast food.  When eating at a restaurant, ask that your food be prepared with less salt or no salt, if possible. What foods are recommended? The items listed may not be a complete list. Talk with your dietitian about what dietary choices are best for you. Grains  Whole-grain or whole-wheat bread. Whole-grain or whole-wheat pasta. Brown rice. [place]. Bulgur. Whole-grain and low-sodium cereals. Pita bread. Low-fat, low-sodium crackers. Whole-wheat flour tortillas. Vegetables  Fresh or frozen vegetables (raw, steamed, roasted, or grilled). Low-sodium or reduced-sodium tomato and vegetable juice. Low-sodium or reduced-sodium tomato sauce and tomato paste. Low-sodium or reduced-sodium canned vegetables. Fruits  All fresh, dried, or frozen fruit. Canned fruit in natural juice (without added sugar). Meat and other protein foods  Skinless chicken or [place]. Ground chicken or [place]. Pork with fat trimmed off. Fish and seafood. Egg whites. Dried beans, peas, or lentils. Unsalted nuts, nut butters, and seeds. Unsalted canned beans. Lean cuts of beef with fat trimmed off. Low-sodium, lean deli meat. Dairy  Low-fat (1%) or fat-free (skim) milk. Fat-free, low-fat, or reduced-fat cheeses. Nonfat, low-sodium ricotta or cottage cheese. Low-fat or nonfat yogurt. Low-fat, low-sodium cheese. Fats and oils  Soft margarine without trans fats. Vegetable oil. Low-fat, reduced-fat, or light mayonnaise and salad dressings (reduced-sodium). Canola, safflower, olive, soybean, and sunflower oils. Avocado. Seasoning and other foods  Herbs. Spices. Seasoning mixes without salt. Unsalted popcorn and pretzels. Fat-free sweets. What foods are not recommended? The items listed may not be a complete list. Talk with your dietitian about what dietary choices are best for you. Grains  Baked goods made with fat, such as croissants, muffins, or some breads. Dry pasta or rice meal packs. Vegetables  Creamed or  fried vegetables. Vegetables in a cheese sauce. Regular canned vegetables (not low-sodium or reduced-sodium). Regular canned tomato sauce and paste (not low-sodium or reduced-sodium). Regular tomato and vegetable juice (not low-sodium or reduced-sodium). Angie Fava. Olives. Fruits  Canned fruit in a light or heavy syrup. Fried fruit. Fruit in cream or butter sauce. Meat and other protein foods  Fatty cuts of meat. Ribs. Fried meat. Berniece Salines. Sausage. Bologna and other processed lunch meats. Salami. Fatback. Hotdogs. Bratwurst. Salted nuts and seeds. Canned beans with added salt. Canned or smoked fish. Whole eggs or egg yolks. Chicken or [place] with skin. Dairy  Whole or 2% milk, cream, and half-and-half. Whole or full-fat cream cheese. Whole-fat or sweetened yogurt. Full-fat cheese. Nondairy creamers. Whipped toppings. Processed cheese and cheese spreads. Fats and oils  Butter. Stick margarine. Lard. Shortening. Ghee. Bacon fat. Tropical oils, such as coconut, palm kernel, or palm oil. Seasoning and other foods  Salted popcorn and pretzels. Onion salt, garlic salt, seasoned salt, table salt, and sea salt. Worcestershire sauce. Tartar sauce. Barbecue sauce. Teriyaki sauce. Soy sauce, including reduced-sodium. Steak sauce. Canned and packaged gravies. Fish sauce. [employer]  sauce. Cocktail sauce. Horseradish that you find on the shelf. Ketchup. Mustard. Meat flavorings and tenderizers. Bouillon cubes. Hot sauce and Tabasco sauce. Premade or packaged marinades. Premade or packaged taco seasonings. Relishes. Regular salad dressings. Where to find more information:  National Heart, Lung, and [place]: https://wilson-eaton.com/  American Heart Association: www.heart.org Summary  The DASH eating plan is a healthy eating plan that has been shown to reduce high blood pressure (hypertension). It may also reduce your risk for type 2 diabetes, heart disease, and stroke.  With the DASH eating plan, you should limit salt  (sodium) intake to 2,300 mg a day. If you have hypertension, you may need to reduce your sodium intake to 1,500 mg a day.  When on the DASH eating plan, aim to eat more fresh fruits and vegetables, whole grains, lean proteins, low-fat dairy, and heart-healthy fats.  Work with your health care provider or diet and nutrition specialist (dietitian) to adjust your eating plan to your individual calorie needs. This information is not intended to replace advice given to you by your health care provider. Make sure you discuss any questions you have with your health care provider. Document Released: 09/04/2011 Document Revised: 09/08/2016 Document Reviewed: 09/08/2016 Elsevier Interactive Patient Education  2017 [employer].

## 2016-12-12 ENCOUNTER — Encounter: Payer: Self-pay | Admitting: Family Medicine

## 2016-12-16 ENCOUNTER — Other Ambulatory Visit: Payer: Medicare Other

## 2016-12-16 DIAGNOSIS — E039 Hypothyroidism, unspecified: Secondary | ICD-10-CM

## 2016-12-16 DIAGNOSIS — E7800 Pure hypercholesterolemia, unspecified: Secondary | ICD-10-CM

## 2016-12-16 LAB — LIPID PANEL
CHOLESTEROL: 223 mg/dL — AB (ref <200)
HDL: 76 mg/dL (ref >50)
LDL Cholesterol: 134 mg/dL — ABNORMAL HIGH (ref <100)
TRIGLYCERIDES: 65 mg/dL (ref <150)
Total CHOL/HDL Ratio: 2.9 Ratio (ref <5.0)
VLDL: 13 mg/dL (ref <30)

## 2016-12-16 LAB — TSH: TSH: 2.02 mIU/L

## 2016-12-23 ENCOUNTER — Encounter: Payer: Self-pay | Admitting: Podiatry

## 2016-12-23 ENCOUNTER — Ambulatory Visit (INDEPENDENT_AMBULATORY_CARE_PROVIDER_SITE_OTHER): Payer: Medicare Other | Admitting: Podiatry

## 2016-12-23 DIAGNOSIS — L603 Nail dystrophy: Secondary | ICD-10-CM

## 2016-12-23 DIAGNOSIS — Z79899 Other long term (current) drug therapy: Secondary | ICD-10-CM | POA: Diagnosis not present

## 2016-12-23 MED ORDER — TERBINAFINE HCL 250 MG PO TABS: 250.0000 mg | ORAL_TABLET | Freq: Every day | ORAL | 0 refills | Status: DC

## 2016-12-24 NOTE — Progress Notes (Signed)
She presents today for follow-up of her nail fungus. States that 5 completed the Lamisil every other day does and that they're looking much better.  Objective: Vital signs are stable she is alert and oriented 3 her toenails have just recently been pain it however evaluation of the distal margin of the toenail does demonstrate some remaining onychomycosis.  Assessment: Remaining onychomycosis.  Plan: I requested that she go ahead and continue in area today does Lamisil 200 mg 1 by mouth every other day #30 follow-up with her in 3 months

## 2017-01-01 ENCOUNTER — Ambulatory Visit (INDEPENDENT_AMBULATORY_CARE_PROVIDER_SITE_OTHER): Payer: Medicare Other | Admitting: Diagnostic Neuroimaging

## 2017-01-01 ENCOUNTER — Encounter (INDEPENDENT_AMBULATORY_CARE_PROVIDER_SITE_OTHER): Payer: Self-pay | Admitting: Diagnostic Neuroimaging

## 2017-01-01 DIAGNOSIS — Z0289 Encounter for other administrative examinations: Secondary | ICD-10-CM

## 2017-01-01 DIAGNOSIS — M21372 Foot drop, left foot: Secondary | ICD-10-CM

## 2017-01-02 NOTE — Procedures (Signed)
[facility]  NCS (NERVE CONDUCTION STUDY) WITH EMG (ELECTROMYOGRAPHY) REPORT   STUDY DATE: 01/01/17 PATIENT NAME: Kayla Guerrero DOB: Jul 16, 1935 MRN: [id number]  ORDERING CLINICIAN: Ardyth Gal, MD  TECHNOLOGIST: Laretta Alstrom  ELECTROMYOGRAPHER: Earlean Polka. Penumalli, MD  CLINICAL INFORMATION: 82 year old female with serous carcinoma of endometrium, status post surgery, lymph node dissection and chemotherapy. Patient has bilateral lower extremity numbness and left foot drop weakness. Evaluate for peripheral neuropathy versus lumbar radiculopathy.   FINDINGS: NERVE CONDUCTION STUDY: Bilateral peroneal motor responses could not be obtained. Bilateral tibial motor responses have prolonged distal latencies, decreased amplitudes, normal conduction velocity of the left and slow conduction velocity on the right. Bilateral superficial peroneal sensory responses could not be obtained. Bilateral tibial F wave latencies are prolonged.   NEEDLE ELECTROMYOGRAPHY: Needle examination of bilateral lower extremities demonstrates positive sharp waves and fibrillation potentials in bilateral tibialis anterior and left gastrocnemius muscles at rest. There is decreased motor unit recruitment in left tibialis anterior on exertion. Other muscles demonstrate normal motor unit recruitment. Left lumbar paraspinal muscles are normal.   IMPRESSION:  Abnormal study demonstrating: 1. Severe axonal sensorimotor polyneuropathy.  - Active denervation changes in bilateral tibialis anterior and left gastrocnemius muscles at rest.  - Chronic denervation in left tibialis anterior on exertion. 2. Normal left lumbar paraspinal EMG. However there are prolonged bilateral F wave latencies which may be related to bilateral lumbar radiculopathies versus peripheral neuropathies.   3. Given the clinical context symptoms are most likely due to severe peripheral neuropathy, likely related to chemotherapy. No  definite evidence of left L5 radiculopathy on this electrical testing. Correlate with neuroimaging study (such as MRI lumbar spine which apparently has been ordered).     INTERPRETING PHYSICIAN:  Penni Bombard, MD Certified in Neurology, Neurophysiology and Neuroimaging  [facility] [address], [place], [place] [address] [phone]  [facility]    Nerve / Sites Muscle Latency Ref. Amplitude Ref. Rel Amp Segments Distance Velocity Ref. Area    ms ms mV mV %  cm m/s m/s mVms  L Peroneal - EDB     Ankle EDB NR ?6.5 NR ?2.0 NR Ankle - EDB 9   NR     Fib head EDB NR  NR  NR Fib head - Ankle   ?44 NR     Pop fossa EDB NR  NR  NR Pop fossa - Fib head   ?44 NR         Pop fossa - Ankle      R Peroneal - EDB     Ankle EDB NR ?6.5 NR ?2.0 NR Ankle - EDB 9   NR     Fib head EDB NR  NR  NR Fib head - Ankle   ?44 NR     Pop fossa EDB NR  NR  NR Pop fossa - Fib head   ?44 NR         Pop fossa - Ankle      L Tibial - AH     Ankle AH 8.3 ?5.8 1.5 ?4.0 100 Ankle - AH 9   4.8     Pop fossa AH 19.9  1.4  97.2 Pop fossa - Ankle 46 40 ?41 5.4  R Tibial - AH     Ankle AH 7.0 ?5.8 1.6 ?4.0 100 Ankle - AH 9   4.0     Pop fossa AH 20.0  1.4  90.6 Pop fossa - Ankle  47 36 ?41 4.2             SNC    Nerve / Sites Rec. Site Peak Lat Ref.  Amp Ref. Segments Distance    ms ms V V  cm  L Superficial peroneal - Ankle     Lat leg Ankle NR ?4.4 NR ?6 Lat leg - Ankle 14  R Superficial peroneal - Ankle     Lat leg Ankle NR ?4.4 NR ?6 Lat leg - Ankle 14         F  Wave    Nerve F Lat Ref.   ms ms  L Tibial - AH 67.0 ?56.0  R Tibial - AH 68.4 ?56.0         H Reflex    Nerve H Lat Lat Hmax   ms ms   Both Ref. Both Ref.  Tibial - Soleus NR ?35.0 NR ?35.0       EMG       EMG Summary Table    Spontaneous MUAP Recruitment  Muscle IA Fib PSW Fasc Other Amp Dur. Poly Pattern  L. Tibialis anterior Normal 1+ 1+ None _______ Increased Normal 1+ Reduced  L. Gastrocnemius  (Medial head) Normal 1+ 1+ None _______ Normal Normal Normal Normal  L. Vastus medialis Normal None None None _______ Normal Normal Normal Normal  L. Biceps femoris (short head) Normal None None None _______ Normal Normal Normal Normal  L. Gluteus medius Normal None None None _______ Normal Normal Normal Normal  L. Lumbar paraspinals (low) Normal None None None _______ Normal Normal Normal Normal  R. Tibialis anterior Normal 1+ 1+ None _______ Normal Normal Normal Normal

## 2017-01-06 ENCOUNTER — Other Ambulatory Visit: Payer: Self-pay | Admitting: Family Medicine

## 2017-01-07 ENCOUNTER — Telehealth: Payer: Self-pay | Admitting: *Deleted

## 2017-01-07 DIAGNOSIS — T451X5A Adverse effect of antineoplastic and immunosuppressive drugs, initial encounter: Principal | ICD-10-CM

## 2017-01-07 DIAGNOSIS — G620 Drug-induced polyneuropathy: Secondary | ICD-10-CM

## 2017-01-07 DIAGNOSIS — F320 Major depressive disorder, single episode, mild: Secondary | ICD-10-CM

## 2017-01-07 MED ORDER — DULOXETINE HCL 60 MG PO CPEP: 60.0000 mg | ORAL_CAPSULE | Freq: Every day | ORAL | 1 refills | Status: DC

## 2017-01-07 NOTE — Telephone Encounter (Signed)
Patient advised.

## 2017-01-07 NOTE — Telephone Encounter (Signed)
Patient called and stated that she is ready to move up to the 60mg  of duloxetine. If you are wanting her to stay on this long term she would like a 90 day rx to [facility] please, if short term than a 30 day to [place] would be okay. Please let me know which, thanks.

## 2017-01-07 NOTE — Telephone Encounter (Signed)
rx sent for #90 with a refill. She should f/u if not tolerating this dose, or in 4-6 weeks if not improving.

## 2017-01-08 ENCOUNTER — Other Ambulatory Visit: Payer: Self-pay | Admitting: Hematology and Oncology

## 2017-01-08 ENCOUNTER — Telehealth: Payer: Self-pay | Admitting: Hematology

## 2017-01-08 ENCOUNTER — Encounter: Payer: Self-pay | Admitting: Hematology and Oncology

## 2017-01-08 ENCOUNTER — Ambulatory Visit (HOSPITAL_BASED_OUTPATIENT_CLINIC_OR_DEPARTMENT_OTHER): Payer: Medicare Other

## 2017-01-08 ENCOUNTER — Other Ambulatory Visit (HOSPITAL_BASED_OUTPATIENT_CLINIC_OR_DEPARTMENT_OTHER): Payer: Medicare Other

## 2017-01-08 ENCOUNTER — Ambulatory Visit (HOSPITAL_BASED_OUTPATIENT_CLINIC_OR_DEPARTMENT_OTHER): Payer: Medicare Other | Admitting: Hematology and Oncology

## 2017-01-08 DIAGNOSIS — N289 Disorder of kidney and ureter, unspecified: Secondary | ICD-10-CM | POA: Insufficient documentation

## 2017-01-08 DIAGNOSIS — G620 Drug-induced polyneuropathy: Secondary | ICD-10-CM | POA: Diagnosis not present

## 2017-01-08 DIAGNOSIS — C541 Malignant neoplasm of endometrium: Secondary | ICD-10-CM

## 2017-01-08 DIAGNOSIS — T451X5A Adverse effect of antineoplastic and immunosuppressive drugs, initial encounter: Secondary | ICD-10-CM

## 2017-01-08 DIAGNOSIS — M21372 Foot drop, left foot: Secondary | ICD-10-CM | POA: Diagnosis not present

## 2017-01-08 DIAGNOSIS — Z95828 Presence of other vascular implants and grafts: Secondary | ICD-10-CM

## 2017-01-08 LAB — CBC WITH DIFFERENTIAL/PLATELET
BASO%: 0.2 % (ref 0.0–2.0)
Basophils Absolute: 0 10*3/uL (ref 0.0–0.1)
EOS%: 3.2 % (ref 0.0–7.0)
Eosinophils Absolute: 0.1 10*3/uL (ref 0.0–0.5)
HCT: 36 % (ref 34.8–46.6)
HGB: 11.9 g/dL (ref 11.6–15.9)
LYMPH%: 30.4 % (ref 14.0–49.7)
MCH: 29.9 pg (ref 25.1–34.0)
MCHC: 33.1 g/dL (ref 31.5–36.0)
MCV: 90.5 fL (ref 79.5–101.0)
MONO#: 0.2 10*3/uL (ref 0.1–0.9)
MONO%: 5.8 % (ref 0.0–14.0)
NEUT#: 2.5 10*3/uL (ref 1.5–6.5)
NEUT%: 60.4 % (ref 38.4–76.8)
Platelets: 235 10*3/uL (ref 145–400)
RBC: 3.98 10*6/uL (ref 3.70–5.45)
RDW: 13.3 % (ref 11.2–14.5)
WBC: 4.1 10*3/uL (ref 3.9–10.3)
lymph#: 1.3 10*3/uL (ref 0.9–3.3)

## 2017-01-08 MED ORDER — SODIUM CHLORIDE 0.9% FLUSH
10.0000 mL | INTRAVENOUS | Status: DC | PRN
  Administered 2017-01-08: 10 mL via INTRAVENOUS
  Filled 2017-01-08: qty 10

## 2017-01-08 MED ORDER — HEPARIN SOD (PORK) LOCK FLUSH 100 UNIT/ML IV SOLN
500.0000 [IU] | Freq: Once | INTRAVENOUS | Status: AC | PRN
  Administered 2017-01-08: 500 [IU] via INTRAVENOUS
  Filled 2017-01-08: qty 5

## 2017-01-08 NOTE — Assessment & Plan Note (Signed)
Based on recent imaging study, she has no signs of cancer recurrence It is recommended for her to keep her port present for at least 2 years after treatment I will get her port flush every 6-8 weeks I will make sure she has a scheduled appointment to follow-up with GYN in a few months time She has appointment to see radiation oncologist in 2 months I will see her back once a year

## 2017-01-08 NOTE — Assessment & Plan Note (Signed)
She is discovered to have incidental renal lesion from prior imaging. I have personally reviewed her CT scan from February 2018 and the lesion is still present This coincides with the incidental finding on her recent MRI I recommend close observation only. She is not symptomatic. She had no recent complaint of hematuria

## 2017-01-08 NOTE — Telephone Encounter (Signed)
Flush scheduled every 6 weeks for 1 year, per 01/08/17 los. Follow up appointment was scheduled for 1 year, per 0412/18 los. Patient was given a copy of the AVS report and appointment schedule per 01/08/17 los.

## 2017-01-08 NOTE — Progress Notes (Signed)
[facility] progress notes  Patient Care Team: Rita Ohara, MD as PCP - General (Family Medicine) Susa Day, MD as Consulting Physician (Orthopedic Surgery)  CHIEF COMPLAINTS/PURPOSE OF VISIT:  Endometrial cancer  HISTORY OF PRESENTING ILLNESS:  Kayla Guerrero 82 y.o. female was transferred to my care after her prior physician has left.  I reviewed the patient's records extensive and collaborated the history with the patient. Summary of her history is as follows: Oncology History   IB USC, negative nodes.     Malignant neoplasm of endometrium ([place])   04/11/2016 Pathology Results    Endometrium, curettage - HIGH GRADE ENDOMETRIAL CARCINOMA, SEE COMMENT. Microscopic Comment The overall appearance favors serous carcinoma.       04/23/2016 Initial Diagnosis    Patient presented to PCP with new vaginal discharge and some vaginal spotting, referred to Dr Dory Horn, whom she had known previously. D&C on 04-09-16 had high grade carcinoma favoring serous histology [phone]). She was referred to gyn oncology, saw Dr Denman George on 04-23-16.       04/30/2016 Imaging    Markedly thickened/widened endometrium consistent with known endometrial cancer. No evidence of serosal or extra uterine extension. 2. No findings to suggest metastatic disease involving the chest, abdomen or pelvis. 3. Indeterminant 12.5 mm right renal lesion, small enhancing mass versus hemorrhagic cyst. Attention on future scans is suggested. 4. Atherosclerotic calcifications involving the thoracic and abdominal aorta and branch vessels but no focal aneurysm. 5. Moderate stool throughout the colon and down into the rectum may suggest constipation.       05/06/2016 Surgery    Robotic hysterectomy and staging. IB USC, 0/11 nodes. Dispositioned to chemotherapy with paclitaxel and carboplatin x 6 with vaginal brachytherapy.      05/06/2016 Pathology Results    1. Lymph node, biopsy, right peri-aortic - ONE  OF ONE LYMPH NODES NEGATIVE FOR MALIGNANCY (0/1). 2. Lymph node, biopsy, left peri-aortic - ONE OF ONE LYMPH NODES NEGATIVE FOR MALIGNANCY (0/1). 3. Lymph node, biopsy, right pelvic - FOUR OF FOUR LYMPH NODES NEGATIVE FOR MALIGNANCY (0/4). 4. Lymph node, biopsy, left pelvic - FIVE OF FIVE LYMPH NODES NEGATIVE FOR MALIGNANCY (0/5). 5. Uterus +/- tubes/ovaries, neoplastic - UTERUS: -ENDO/MYOMETRIUM: INVASIVE MIXED ENDOMETRIOID AND SEROUS CARCINOMA, SPANNING 4 CM. TUMOR INVADES OUTER HALF OF MYOMETRIUM. LYMPHOVASCULAR INVASION PRESENT. SEE ONCOLOGY TABLE. LEIOMYOMA. -SEROSA: UNINVOLVED. NO MALIGNANCY. - CERVIX: BENIGN SQUAMOUS AND ENDOCERVICAL MUCOSA. NO DYSPLASIA OR MALIGNANCY. - BILATERAL OVARIES: INCLUSION CYSTS. NO MALIGNANCY. - BILATERAL FALLOPIAN TUBES: UNREMARKABLE. NO MALIGNANCY.  Specimen: Uterus, cervix, bilateral ovaries and fallopian tubes, bilateral pelvic and para-aortic lymph nodes. Procedure: Hysterectomy with bilateral salpingo-oophorectomy. Lymph node sampling performed: Bilateral pelvic and para-aortic lymph node biopsies Specimen integrity: Intact. 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      05/16/2016 Imaging    Ct abdomen: Extensive subcutaneous emphysema about the abdomen and pelvis, likely postoperative. Probable abdominal pelvic wall small volume hematomas, as above. 2. Right pelvic sidewall fluid collection is likely a seroma or lymphangioma. No explanation for left lower extremity pain. 3. Minimal ill-defined fluid in the presacral space and left adnexa. 4. Small hiatal hernia. 5. An incidental finding of potential clinical significance has been found. Indeterminate right renal lesion is similar to on the recent exam. Consider further evaluation with dedicated outpatient pre and post contrast abdominal MRI      06/11/2016 - 08/22/2016 Chemotherapy    The patient completed only three cycles due to  toxicity  from paclitaxel and carboplatin.       07/29/2016 Imaging    CT angiogram chest: No demonstrable pulmonary embolus. Multiple foci of atherosclerotic calcification in the aorta as well as foci of coronary artery calcification.  No edema or consolidation. No lung mass or nodule lesion. No adenopathy. Gallbladder absent.  Stable mild biliary duct prominence. Stable nodular opacity right lobe of thyroid which does not meet consensus guidelines criteria for further assessment      08/06/2016 - 09/03/2016 Radiation Therapy    HDR vaginal cuff brachytherapy x 5 fractions.      08/11/2016 Procedure    Ultrasound and fluoroscopically guided right internal jugular single lumen power port catheter insertion. Tip in the SVC/RA junction. Catheter ready for use.      09/23/2016 Imaging    Ct abdomen: No findings suspicious for metastatic disease in the abdomen or pelvis. 2. Thin-walled lobulated 6.9 x 5.8 x 5.8 cm right pelvic sidewall fluid collection, increased in size since 05/16/2016 CT study, favor a postoperative lymphocele, which demonstrates extrinsic mass-effect on the right bladder wall. 3. Indeterminate 1.2 cm posterior interpolar right renal cortical lesion, for which 4 month stability has been demonstrated, renal neoplasm not excluded. Recommend either dedicated renal protocol MRI or CT abdomen without and with IV contrast or continued attention on follow-up surveillance CT studies, as clinically warranted. 4. Additional findings include aortic atherosclerosis, moderate hiatal hernia, moderate sigmoid diverticulosis, tiny fat containing umbilical hernia, and degenerative disc disease, facet arthropathy and spondylolisthesis in the lower lumbar spine      09/30/2016 Procedure    She had successful CT-guided aspiration of right pelvic fluid collection. Approximately 90 mL yellow serous fluid was aspirated. Samples were sent for culture and cytology.      11/19/2016 Imaging    CT abdomen: No CT  findings to suggest recurrent tumor, lymphadenopathy or metastatic disease. 2. Stable right-sided pelvic sidewall cyst with mass effect on the bladder. 3. Stable advanced atherosclerotic calcifications involving the aorta and iliac arteries. 4. Moderate size hiatal hernia. 5. Status post cholecystectomy with stable intra and extrahepatic biliary dilatation      12/29/2016 Pathology Results    SOFT TISSUE, FINE NEEDLE ASPIRATION, RIGHT ADNEXAL CYST (SPECIMEN 1 OF 1 COLLECTED 09-30-2016) NO MALIGNANT CELLS IDENTIFIED.      She is here accompanied by her husband She denies recent vaginal discharge Appetite is stable. Denies constipation She is started on Cymbalta by primary care doctor from management of peripheral neuropathy It is stable She also did complain of foot drop She is undergoing evaluation by orthopedic surgeon and has nerve conduction study done She denies recent falls  MEDICAL HISTORY:  Past Medical History:  Diagnosis Date  . Arthritis   . Arthritis of knee, left   . Borderline osteopenia DEXA 2006 and 2012  . Cancer [facility])    endometrial  . Cataract   . Cholesterol serum elevated   . Colon polyps   . FHx: colon cancer   . GERD (gastroesophageal reflux disease)   . History of radiation therapy 08/06/16 - 09/03/16   vaginal cuff treated to 30 Gy in 5 fractions  . Hypothyroidism   . Obesity     SURGICAL HISTORY: Past Surgical History:  Procedure Laterality Date  . CATARACT EXTRACTION, BILATERAL    . CHOLECYSTECTOMY    . COLONOSCOPY  2010, 04/2013   Buccini  . IR GENERIC HISTORICAL  08/11/2016   IR FLUORO GUIDE PORT INSERTION RIGHT 08/11/2016 Greggory Keen, MD [facility]-INTER[facility]  RAD  . IR GENERIC HISTORICAL  08/11/2016   IR US GUIDE VASC ACCESS RIGHT 08/11/2016 Greggory Keen, MD [facility] RAD  . PTERYGIUM EXCISION    . ROBOTIC ASSISTED TOTAL HYSTERECTOMY WITH BILATERAL SALPINGO OOPHERECTOMY N/A 05/06/2016   Procedure: XI ROBOTIC ASSISTED  LAPAROSCOPIC TOTAL HYSTERECTOMY WITH  BILATERAL SALPINGO OOPHORECTOMY;  Surgeon: Nancy Marus, MD;  Location: [facility] ORS;  Service: Gynecology;  Laterality: N/A;  . SENTINEL NODE BIOPSY N/A 05/06/2016   Procedure: SENTINEL NODE BIOPSY;  Surgeon: Nancy Marus, MD;  Location: [facility] ORS;  Service: Gynecology;  Laterality: N/A;  . TUBAL LIGATION      SOCIAL HISTORY: Social History   Social History  . Marital status: Married    Spouse name: N/A  . Number of children: 1  . Years of education: N/A   Occupational History  . retired Librarian, academic); Artist    Social History Main Topics  . Smoking status: Former Smoker    Types: Cigarettes    Quit date: 09/29/1980  . Smokeless tobacco: Never Used  . Alcohol use 0.0 oz/week     Comment: 1 glass of wine 3-4 times/week; hard liquor (highball) or a beer once a week  . Drug use: No  . Sexual activity: Not on file   Other Topics Concern  . Not on file   Social History Narrative   Lives with husband.  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 Son lives in [place]; granddaughter lives in [place] (with her mother--son is divorced)    FAMILY HISTORY: Family History  Problem Relation Age of Onset  . Cancer Mother 67    COLON   . Heart disease Mother   . Cancer Maternal Aunt   . Breast cancer Maternal Aunt   . Mental illness Maternal Uncle   . Cancer Maternal Grandmother     stomach  . Macular degeneration Maternal Grandmother   . Diabetes Neg Hx     ALLERGIES:  is allergic to levaquin [levofloxacin hemihydrate]; promethazine hcl; and sulfa drugs cross reactors.  MEDICATIONS:  Current Outpatient Prescriptions  Medication Sig Dispense Refill  . Alpha-Lipoic Acid 600 MG CAPS Take 1 capsule by mouth daily.    [name] b complex vitamins capsule Take 1 capsule by mouth daily.    . Cholecalciferol (VITAMIN D) 2000 UNITS tablet Take 2,000 Units by mouth daily.    . DULoxetine (CYMBALTA) 60  MG capsule Take 1 capsule (60 mg total) by mouth daily. 90 capsule 1  . glucosamine-chondroitin 500-400 MG tablet Take 1 tablet by mouth 2 (two) times daily.     [name] levothyroxine (SYNTHROID, LEVOTHROID) 88 MCG tablet TAKE ONE TABLET BY MOUTH ONCE DAILY BEFORE  BREAKFAST. 90 tablet 1  . lidocaine-prilocaine (EMLA) cream     . Magnesium 400 MG CAPS Take 1 capsule by mouth 2 (two) times daily.     [name] MELATONIN PO Take 1 tablet by mouth at bedtime as needed.    . Multiple Vitamin (MULTI-VITAMIN) tablet Take 1 tablet by mouth daily.      . naproxen sodium (ANAPROX) 220 MG tablet Take 220 mg by mouth 2 (two) times daily as needed (pain).     . polyethylene glycol (MIRALAX / GLYCOLAX) packet Take 17 g by mouth 2 (two) times daily as needed for mild constipation.     . senna-docusate (SENOKOT S) 8.6-50 MG tablet Take 1 tablet by mouth 2 (two) times daily  as needed for mild constipation.    . terbinafine (LAMISIL) 250 MG tablet Take 1 tablet (250 mg total) by mouth daily. (Patient taking differently: Take 250 mg by mouth daily. Using every other day) 30 tablet 0  . Wheat Dextrin (BENEFIBER DRINK MIX PO) Take 1 scoop by mouth 2 (two) times daily.     [name] oxyCODONE-acetaminophen (PERCOCET/ROXICET) 5-325 MG tablet Take 1-2 tablets by mouth every 4 (four) hours as needed (moderate to severe pain). (Patient not taking: Reported on 12/11/2016) 20 tablet 0   No current facility-administered medications for this visit.    Facility-Administered Medications Ordered in Other Visits  Medication Dose Route Frequency Provider Last Rate Last Dose  . influenza  inactive virus vaccine (FLUZONE/FLUARIX) injection 0.5 mL  0.5 mL Intramuscular Once Rita Ohara, MD        REVIEW OF SYSTEMS:   Constitutional: Denies fevers, chills or abnormal night sweats Eyes: Denies blurriness of vision, double vision or watery eyes Ears, nose, mouth, throat, and face: Denies mucositis or sore throat Respiratory: Denies cough, dyspnea or  wheezes Cardiovascular: Denies palpitation, chest discomfort or lower extremity swelling Gastrointestinal:  Denies nausea, heartburn or change in bowel habits Skin: Denies abnormal skin rashes Lymphatics: Denies new lymphadenopathy or easy bruising Neurological:Denies numbness, tingling or new weaknesses Behavioral/Psych: Mood is stable, no new changes  All other systems were reviewed with the patient and are negative.  PHYSICAL EXAMINATION: ECOG PERFORMANCE STATUS: 1 - Symptomatic but completely ambulatory  Vitals:   01/08/17 1216  BP: 126/65  Pulse: 66  Resp: 18  Temp: 98.4 F (36.9 C)   Filed Weights   01/08/17 1216  Weight: 188 lb 4.8 oz (85.4 kg)    GENERAL:alert, no distress and comfortable. She is obese SKIN: skin color, texture, turgor are normal, no rashes or significant lesions EYES: normal, conjunctiva are pink and non-injected, sclera clear OROPHARYNX:no exudate, normal lips, buccal mucosa, and tongue  NECK: supple, thyroid normal size, non-tender, without nodularity LYMPH:  no palpable lymphadenopathy in the cervical, axillary or inguinal LUNGS: clear to auscultation and percussion with normal breathing effort HEART: regular rate & rhythm and no murmurs with mild left lower extremity edema ABDOMEN:abdomen soft, non-tender and normal bowel sounds Musculoskeletal:no cyanosis of digits and no clubbing  PSYCH: alert & oriented x 3 with fluent speech NEURO: no focal motor/sensory deficits  LABORATORY DATA:  I have reviewed the data as listed Lab Results  Component Value Date   WBC 4.1 01/08/2017   HGB 11.9 01/08/2017   HCT 36.0 01/08/2017   MCV 90.5 01/08/2017   PLT 235 01/08/2017    Recent Labs  01/15/16 0920  05/07/16 0455  07/29/16 1213  08/11/16 1256  09/18/16 0926 09/25/16 1146 10/09/16 0935  NA  --   < > 137  < > 137  < > 139  < > 140 139 141  K  --   < > 4.7  < > 3.9  < > 4.1  < > 4.0 4.0 4.1  CL  --   < > 107  --  104  --  106  --   --   --    --   CO2  --   < > 27  < > 28  < > 23  < > 26 25 28   GLUCOSE  --   < > 120*  < > 104*  < > 93  < > 62* 99 73  BUN  --   < > 12  < >  19  < > 14  < > 12.3 12.9 16.0  CREATININE  --   < > 0.90  < > 0.79  < > 0.74  < > 0.8 0.8 0.8  CALCIUM  --   < > 8.0*  < > 8.5*  < > 8.9  < > 8.7 8.8 9.0  GFRNONAA  --   < > 59*  --  >60  --  >60  --   --   --   --   GFRAA  --   < > >60  --  >60  --  >60  --   --   --   --   PROT 6.3  < >  --   < > 6.0*  < >  --   < > 6.7 6.4 6.7  ALBUMIN 3.6  < >  --   < > 3.3*  < >  --   < > 3.4* 3.4* 3.3*  AST 17  < >  --   < > 20  < >  --   < > 20 20 16   ALT 7  < >  --   < > 11*  < >  --   < > 10 9 <6  ALKPHOS 48  < >  --   < > 47  < >  --   < > 74 69 73  BILITOT 0.4  < >  --   < > 0.8  < >  --   < > 0.23 0.29 0.24  BILIDIR 0.1  --   --   --   --   --   --   --   --   --   --   IBILI 0.3  --   --   --   --   --   --   --   --   --   --   < > = values in this interval not displayed.  RADIOGRAPHIC STUDIES:I have reviewed recent Ct imaging I have personally reviewed the radiological images as listed and agreed with the findings in the report.  ASSESSMENT & PLAN:  Malignant neoplasm of endometrium ([place]) Based on recent imaging study, she has no signs of cancer recurrence It is recommended for her to keep her port present for at least 2 years after treatment I will get her port flush every 6-8 weeks I will make sure she has a scheduled appointment to follow-up with GYN in a few months time She has appointment to see radiation oncologist in 2 months I will see her back once a year  Kidney lesion, native, right She is discovered to have incidental renal lesion from prior imaging. I have personally reviewed her CT scan from February 2018 and the lesion is still present This coincides with the incidental finding on her recent MRI I recommend close observation only. She is not symptomatic. She had no recent complaint of hematuria  Left foot drop The cause is  unknown. It could be related to prior injury from surgery versus a new problem I recommend neurology evaluation. She is being followed closely in the orthopedic service and according to her, they are arranging further evaluation.  Chemotherapy-induced neuropathy ([place]) Her primary care doctor has started her on Cymbalta She tolerated Cymbalta well I recommend she continues the same and I would defer to her primary care doctor for further management   No orders of the defined types were placed in this encounter.  All questions were answered. The patient knows to call the clinic with any problems, questions or concerns. I spent 30 minutes counseling the patient face to face. The total time spent in the appointment was 40 minutes and more than 50% was on counseling.     [name], MD 01/08/2017 1:07 PM

## 2017-01-08 NOTE — Assessment & Plan Note (Signed)
Her primary care doctor has started her on Cymbalta She tolerated Cymbalta well I recommend she continues the same and I would defer to her primary care doctor for further management

## 2017-01-08 NOTE — Assessment & Plan Note (Addendum)
The cause is unknown. It could be related to prior injury from surgery versus a new problem I recommend neurology evaluation. She is being followed closely in the orthopedic service and according to her, they are arranging further evaluation.

## 2017-01-09 ENCOUNTER — Telehealth: Payer: Self-pay | Admitting: *Deleted

## 2017-01-09 NOTE — Telephone Encounter (Signed)
Per Dr. Alvy Bimler staff message I have scheduled appt. For June 27th at 3:45pm. Patient aware of date/time

## 2017-01-13 ENCOUNTER — Encounter: Payer: Medicare Other | Admitting: Neurology

## 2017-01-27 DIAGNOSIS — N739 Female pelvic inflammatory disease, unspecified: Secondary | ICD-10-CM

## 2017-01-27 HISTORY — DX: Female pelvic inflammatory disease, unspecified: N73.9

## 2017-02-03 ENCOUNTER — Ambulatory Visit: Payer: Medicare Other | Admitting: Neurology

## 2017-02-04 ENCOUNTER — Encounter: Payer: Self-pay | Admitting: *Deleted

## 2017-02-06 ENCOUNTER — Encounter: Payer: Self-pay | Admitting: Diagnostic Neuroimaging

## 2017-02-06 ENCOUNTER — Ambulatory Visit (INDEPENDENT_AMBULATORY_CARE_PROVIDER_SITE_OTHER): Payer: Medicare Other | Admitting: Diagnostic Neuroimaging

## 2017-02-06 VITALS — BP 120/76 | HR 87 | Ht 66.0 in | Wt 190.2 lb

## 2017-02-06 DIAGNOSIS — G620 Drug-induced polyneuropathy: Secondary | ICD-10-CM | POA: Diagnosis not present

## 2017-02-06 DIAGNOSIS — M5417 Radiculopathy, lumbosacral region: Secondary | ICD-10-CM

## 2017-02-06 DIAGNOSIS — M21372 Foot drop, left foot: Secondary | ICD-10-CM

## 2017-02-06 DIAGNOSIS — T451X5A Adverse effect of antineoplastic and immunosuppressive drugs, initial encounter: Principal | ICD-10-CM

## 2017-02-06 NOTE — Progress Notes (Signed)
[facility]  PATIENT: Kayla Guerrero DOB: May 05, 1935  REFERRING CLINICIAN: J Beane HISTORY FROM: patient and husband REASON FOR VISIT: new consult    HISTORICAL  CHIEF COMPLAINT:  Chief Complaint  Patient presents with  . NX  Beane  . Peripheral Neuropathy    Bil feet PN, R hand numbness, tingling, L foot drop, Brought CD.    HISTORY OF PRESENT ILLNESS:   82 year old female here for evaluation of left foot drop and neuropathy. Patient had diagnosis of endometrial carcinoma, treated with hysterectomy and chemotherapy (paclitaxel and carboplatin) from August until December 2017. Patient developed numbness in the morning in bilateral feet as well as weakness in left foot. Patient followed up with PCP, oncology, orthopedic surgeon, and then referred to our clinic for EMG nerve conduction study. EMG nerve conduction study showed severe axonal polyneuropathy. MRI of the lumbar spine was also obtained which showed moderate spinal stenosis at L4-5 level. He should then referred to me for further evaluation and treatment options.  Symptoms are stable overall. No significant pain in her feet or toes. Patient has been through physical therapy exercises. Patient not felt to be surgical candidate at this time.   REVIEW OF SYSTEMS: Full 14 system review of systems performed and negative with exception of: Fatigue fever urination dizziness weakness aching muscle walking difficulty restless legs.  ALLERGIES: Allergies  Allergen Reactions  . Levaquin [Levofloxacin Hemihydrate] Other (See Comments)  . Promethazine Hcl Other (See Comments)    fainting  . Sulfa Drugs Cross Reactors Swelling    HOME MEDICATIONS: Outpatient Medications Prior to Visit  Medication Sig Dispense Refill  . Alpha-Lipoic Acid 600 MG CAPS Take 1 capsule by mouth daily.    [name] b complex vitamins capsule Take 1 capsule by mouth daily.    . Cholecalciferol (VITAMIN D) 2000 UNITS tablet Take 2,000  Units by mouth daily.    . DULoxetine (CYMBALTA) 60 MG capsule Take 1 capsule (60 mg total) by mouth daily. 90 capsule 1  . glucosamine-chondroitin 500-400 MG tablet Take 1 tablet by mouth 2 (two) times daily.     [name] levothyroxine (SYNTHROID, LEVOTHROID) 88 MCG tablet TAKE ONE TABLET BY MOUTH ONCE DAILY BEFORE  BREAKFAST. 90 tablet 1  . lidocaine-prilocaine (EMLA) cream     . Magnesium 400 MG CAPS Take 1 capsule by mouth 2 (two) times daily.     [name] MELATONIN PO Take 1 tablet by mouth at bedtime as needed.    . Multiple Vitamin (MULTI-VITAMIN) tablet Take 1 tablet by mouth daily.      . naproxen sodium (ANAPROX) 220 MG tablet Take 220 mg by mouth 2 (two) times daily as needed (pain).     . polyethylene glycol (MIRALAX / GLYCOLAX) packet Take 17 g by mouth 2 (two) times daily as needed for mild constipation.     . senna-docusate (SENOKOT S) 8.6-50 MG tablet Take 1 tablet by mouth 2 (two) times daily as needed for mild constipation.    . terbinafine (LAMISIL) 250 MG tablet Take 1 tablet (250 mg total) by mouth daily. (Patient taking differently: Take 250 mg by mouth daily. Using every other day) 30 tablet 0  . Wheat Dextrin (BENEFIBER DRINK MIX PO) Take 1 scoop by mouth 2 (two) times daily.     [name] oxyCODONE-acetaminophen (PERCOCET/ROXICET) 5-325 MG tablet Take 1-2 tablets by mouth every 4 (four) hours as needed (moderate to severe pain). (Patient not taking: Reported on 12/11/2016) 20 tablet 0   Facility-Administered Medications Prior  to Visit  Medication Dose Route Frequency Provider Last Rate Last Dose  . influenza  inactive virus vaccine (FLUZONE/FLUARIX) injection 0.5 mL  0.5 mL Intramuscular Once Rita Ohara, MD        PAST MEDICAL HISTORY: Past Medical History:  Diagnosis Date  . Arthritis   . Arthritis of knee, left   . Borderline osteopenia DEXA 2006 and 2012  . Cancer [facility])    endometrial  . Cataract   . Cholesterol serum elevated   . Colon polyps   . DDD (degenerative disc disease),  lumbar   . FHx: colon cancer   . GERD (gastroesophageal reflux disease)   . History of radiation therapy 08/06/16 - 09/03/16   vaginal cuff treated to 30 Gy in 5 fractions  . Hypothyroidism   . Obesity     PAST SURGICAL HISTORY: Past Surgical History:  Procedure Laterality Date  . CATARACT EXTRACTION, BILATERAL    . CHOLECYSTECTOMY    . COLONOSCOPY  2010, 04/2013   Buccini  . IR GENERIC HISTORICAL  08/11/2016   IR FLUORO GUIDE PORT INSERTION RIGHT 08/11/2016 Greggory Keen, MD [facility]  . IR GENERIC HISTORICAL  08/11/2016   IR US GUIDE VASC ACCESS RIGHT 08/11/2016 Greggory Keen, MD [facility]  . PTERYGIUM EXCISION    . ROBOTIC ASSISTED TOTAL HYSTERECTOMY WITH BILATERAL SALPINGO OOPHERECTOMY N/A 05/06/2016   Procedure: XI ROBOTIC ASSISTED  LAPAROSCOPIC TOTAL HYSTERECTOMY WITH BILATERAL SALPINGO OOPHORECTOMY;  Surgeon: Nancy Marus, MD;  Location: [facility];  Service: Gynecology;  Laterality: N/A;  . SENTINEL NODE BIOPSY N/A 05/06/2016   Procedure: SENTINEL NODE BIOPSY;  Surgeon: Nancy Marus, MD;  Location: [facility];  Service: Gynecology;  Laterality: N/A;  . TUBAL LIGATION      FAMILY HISTORY: Family History  Problem Relation Age of Onset  . Cancer Mother 67       COLON   . Heart disease Mother   . Cancer Maternal Aunt   . Breast cancer Maternal Aunt   . Mental illness Maternal Uncle   . Cancer Maternal Grandmother        stomach  . Macular degeneration Maternal Grandmother   . Diabetes Neg Hx     SOCIAL HISTORY:  Social History   Social History  . Marital status: Married    Spouse name: Jeneen Rinks  . Number of children: 1  . Years of education: MFA   Occupational History  . retired Librarian, academic); Artist    Social History Main Topics  . Smoking status: Former Smoker    Packs/day: 1.00    Types: Cigarettes    Quit date: 09/29/1980  . Smokeless tobacco: Never Used  . Alcohol use 0.0 oz/week     Comment: 1 glass of wine 3-4 times/week; hard liquor (highball) or a beer  once a week  . Drug use: No  . Sexual activity: Not on file   Other Topics Concern  . Not on file   Social History Narrative   Lives with husband.  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 Son lives in [place]; granddaughter lives in [place] (with her mother--son is divorced)     PHYSICAL EXAM  GENERAL EXAM/CONSTITUTIONAL: Vitals:  Vitals:   02/06/17 1053  BP: 120/76  Pulse: 87  Weight: 190 lb 3.2 oz (86.3 kg)  Height: 5\' 6"  (1.676 m)     Body mass index is 30.7 kg/m.  No exam data  present  Patient is in no distress; well developed, nourished and groomed; neck is supple  CARDIOVASCULAR:  Examination of carotid arteries is normal; no carotid bruits  Regular rate and rhythm, no murmurs  Examination of peripheral vascular system by observation and palpation is normal  EYES:  Ophthalmoscopic exam of optic discs and posterior segments is normal; no papilledema or hemorrhages  MUSCULOSKELETAL:  Gait, strength, tone, movements noted in Neurologic exam below  NEUROLOGIC: MENTAL STATUS:  No flowsheet data found.  awake, alert, oriented to person, place and time  recent and remote memory intact  normal attention and concentration  language fluent, comprehension intact, naming intact,   fund of knowledge appropriate  CRANIAL NERVE:   2nd - no papilledema on fundoscopic exam  2nd, 3rd, 4th, 6th - pupils equal and reactive to light, visual fields full to confrontation, extraocular muscles intact, no nystagmus  5th - facial sensation symmetric  7th - facial strength symmetric  8th - hearing intact  9th - palate elevates symmetrically, uvula midline  11th - shoulder shrug symmetric  12th - tongue protrusion midline  MOTOR:   normal bulk and tone, full strength in the BUE, BLE  EXCEPT DECR FINGER ABDUCTION IN RIGHT HAND  EXCEPT LEFT FOOT  DORSIFLEX WEAKNESS 3/5  SENSORY:   normal and symmetric to light touch, pinprick, temperature, vibration  EXCEPT DECR PP IN BILATERAL FEET (LEFT WORSE THAN RIGHT)  COORDINATION:   finger-nose-finger, fine finger movements normal  REFLEXES:   deep tendon reflexes 2 IN BUE; BILATERAL KNEES  RIGHT ANKLE 1  LEFT ANKLE ABSENT  GAIT/STATION:   narrow based gait; MILD LEFT FOOT DROP    DIAGNOSTIC DATA (LABS, IMAGING, TESTING) - I reviewed patient records, labs, notes, testing and imaging myself where available.  Lab Results  Component Value Date   WBC 4.1 01/08/2017   HGB 11.9 01/08/2017   HCT 36.0 01/08/2017   MCV 90.5 01/08/2017   PLT 235 01/08/2017      Component Value Date/Time   NA 141 10/09/2016 0935   K 4.1 10/09/2016 0935   CL 106 08/11/2016 1256   CO2 28 10/09/2016 0935   GLUCOSE 73 10/09/2016 0935   BUN 16.0 10/09/2016 0935   CREATININE 0.8 10/09/2016 0935   CALCIUM 9.0 10/09/2016 0935   PROT 6.7 10/09/2016 0935   ALBUMIN 3.3 (L) 10/09/2016 0935   AST 16 10/09/2016 0935   ALT <6 10/09/2016 0935   ALKPHOS 73 10/09/2016 0935   BILITOT 0.24 10/09/2016 0935   GFRNONAA >60 08/11/2016 1256   GFRAA >60 08/11/2016 1256   Lab Results  Component Value Date   CHOL 223 (H) 12/16/2016   HDL 76 12/16/2016   LDLCALC 134 (H) 12/16/2016   TRIG 65 12/16/2016   CHOLHDL 2.9 12/16/2016   No results found for: HGBA1C No results found for: VITAMINB12 Lab Results  Component Value Date   TSH 2.02 12/16/2016    01/01/17 EMG/NCS Abnormal study demonstrating: 1. Severe axonal sensorimotor polyneuropathy.             - Active denervation changes in bilateral tibialis anterior and left gastrocnemius muscles at rest.             - Chronic denervation in left tibialis anterior on exertion. 2. Normal left lumbar paraspinal EMG. However there are prolonged bilateral F wave latencies which may be related to bilateral lumbar radiculopathies versus peripheral neuropathies.     3. Given the clinical context symptoms are most likely due to severe peripheral  neuropathy, likely related to chemotherapy. No definite evidence of left L5 radiculopathy on this electrical testing. Correlate with neuroimaging study (such as MRI lumbar spine which apparently has been ordered).  12/18/16 MRI lumbar spine [I reviewed images myself and agree with interpretation. -[facility]]  - At L4-5 moderate central canal stenosis with encroachment of the bilateral L5 nerve roots - At L3-4 mild central canal stenosis - Right kidney lesion      ASSESSMENT AND PLAN  82 y.o. year old female here with bilateral lower extremity numbness, tingling, left greater than right foot drop, in the setting of recent chemotherapy from August December 2017 and moderate spinal stenosis at L4-5. Patient's symptoms are likely related to combination of neuropathy and lumbar spinal stenosis. Hopefully symptoms will gradually improve with time. No other specific testing or treatment of chemotherapy neuropathy at this time. Defer lumbar general spine disease management to orthopedic surgeon.   Dx: chemo neuropathy + lumbar radiculopathy (due to moderate lumbar spinal stenosis at L4-5)  1. Chemotherapy-induced peripheral neuropathy ([place])   2. Left foot drop   3. Lumbosacral radiculopathy at L5      PLAN: I spent 25 minutes of face to face time with patient. Greater than 50% of time was spent in counseling and coordination of care with patient. In summary we discussed: - diagnosis, testing results, prognosis and treatment options - continue physical therapy exercises  Return if symptoms worsen or fail to improve, for return to PCP.    Penni Bombard, MD 01/21/9562, 87:56 AM Certified in Neurology, Neurophysiology and Neuroimaging  [facility] [address] [place], [place] [address] [phone]

## 2017-02-06 NOTE — Patient Instructions (Addendum)
Thank you for coming to see [place] at [facility]. I hope we have been able to provide you high quality care today.  You may receive a patient satisfaction survey over the next few weeks. We would appreciate your feedback and comments so that we may continue to improve ourselves and the health of our patients.  - continue physical therapy exercises   ~~~~~~~~~~~~~~~~~~~~~~~~~~~~~~~~~~~~~~~~~~~~~~~~~~~~~~~~~~~~~~~~~  DR. Esme Durkin'S GUIDE TO HAPPY AND HEALTHY LIVING These are some of my general health and wellness recommendations. Some of them may apply to you better than others. Please use common sense as you try these suggestions and feel free to ask me any questions.   ACTIVITY/FITNESS Mental, social, emotional and physical stimulation are very important for brain and body health. Try learning a new activity (arts, music, language, sports, games).  Keep moving your body to the best of your abilities. You can do this at home, inside or outside, the park, community center, gym or anywhere you like. Consider a physical therapist or personal trainer to get started. Consider the app Sworkit. Fitness trackers such as smart-watches, smart-phones or Fitbits can help as well.   NUTRITION Eat more plants: colorful vegetables, nuts, seeds and berries.  Eat less sugar, salt, preservatives and processed foods.  Avoid toxins such as cigarettes and alcohol.  Drink water when you are thirsty. Warm water with a slice of lemon is an excellent morning drink to start the day.  Consider these websites for more information The Nutrition Source (https://www.henry-hernandez.biz/) Precision Nutrition (WindowBlog.ch)   RELAXATION Consider practicing mindfulness meditation or other relaxation techniques such as deep breathing, prayer, yoga, tai chi, massage. See website mindful.org or the apps Headspace or Calm to help get started.   SLEEP Try to  get at least 7-8+ hours sleep per day. Regular exercise and reduced caffeine will help you sleep better. Practice good sleep hygeine techniques. See website sleep.org for more information.   PLANNING Prepare estate planning, living will, healthcare POA documents. Sometimes this is best planned with the help of an attorney. Theconversationproject.org and agingwithdignity.org are excellent resources.

## 2017-02-16 ENCOUNTER — Ambulatory Visit (INDEPENDENT_AMBULATORY_CARE_PROVIDER_SITE_OTHER): Payer: Medicare Other | Admitting: Family Medicine

## 2017-02-16 VITALS — BP 80/50 | HR 108 | Ht 66.0 in | Wt 186.8 lb

## 2017-02-16 DIAGNOSIS — R5383 Other fatigue: Secondary | ICD-10-CM

## 2017-02-16 DIAGNOSIS — I951 Orthostatic hypotension: Secondary | ICD-10-CM | POA: Diagnosis not present

## 2017-02-16 DIAGNOSIS — F320 Major depressive disorder, single episode, mild: Secondary | ICD-10-CM | POA: Diagnosis not present

## 2017-02-16 DIAGNOSIS — G620 Drug-induced polyneuropathy: Secondary | ICD-10-CM

## 2017-02-16 DIAGNOSIS — R42 Dizziness and giddiness: Secondary | ICD-10-CM

## 2017-02-16 DIAGNOSIS — T451X5A Adverse effect of antineoplastic and immunosuppressive drugs, initial encounter: Secondary | ICD-10-CM | POA: Diagnosis not present

## 2017-02-16 DIAGNOSIS — R1030 Lower abdominal pain, unspecified: Secondary | ICD-10-CM

## 2017-02-16 LAB — CBC WITH DIFFERENTIAL/PLATELET
BASOS ABS: 0 {cells}/uL (ref 0–200)
Basophils Relative: 0 %
EOS ABS: 0 {cells}/uL — AB (ref 15–500)
EOS PCT: 0 %
HCT: 36 % (ref 35.0–45.0)
Hemoglobin: 11.9 g/dL (ref 11.7–15.5)
Lymphocytes Relative: 8 %
Lymphs Abs: 1296 cells/uL (ref 850–3900)
MCH: 29.2 pg (ref 27.0–33.0)
MCHC: 33.1 g/dL (ref 32.0–36.0)
MCV: 88.2 fL (ref 80.0–100.0)
MONO ABS: 648 {cells}/uL (ref 200–950)
MONOS PCT: 4 %
MPV: 10.2 fL (ref 7.5–12.5)
NEUTROS PCT: 88 %
Neutro Abs: 14256 cells/uL — ABNORMAL HIGH (ref 1500–7800)
PLATELETS: 529 10*3/uL — AB (ref 140–400)
RBC: 4.08 MIL/uL (ref 3.80–5.10)
RDW: 13.9 % (ref 11.0–15.0)
WBC: 16.2 10*3/uL — ABNORMAL HIGH (ref 4.0–10.5)

## 2017-02-16 LAB — POCT URINALYSIS DIPSTICK
Bilirubin, UA: NEGATIVE
GLUCOSE UA: NEGATIVE
KETONES UA: NEGATIVE
Leukocytes, UA: NEGATIVE
Nitrite, UA: NEGATIVE
SPEC GRAV UA: 1.02 (ref 1.010–1.025)
Urobilinogen, UA: 0.2 E.U./dL
pH, UA: 6 (ref 5.0–8.0)

## 2017-02-16 MED ORDER — DULOXETINE HCL 30 MG PO CPEP: 30.0000 mg | ORAL_CAPSULE | Freq: Every day | ORAL | 1 refills | Status: DC

## 2017-02-16 NOTE — Patient Instructions (Addendum)
  Drink plenty of fluids to stay well hydrated. Move slowly--I don't want you jumping up quickly and fainting.  Contact us if you develop fever, blood in the urine or other new symptoms. Avoid all dairy for about a week. Consider taking a probiotic daily until bowels are back to normal.   Cut back to 40mg  of cymbalta for a week. Then decrease to 30mg .  Stay at the 30mg  dose.  Let us know if you have any worsening depression or any burning PAIN develops.  I suspect the numbness/tingling will remain the same.

## 2017-02-16 NOTE — Progress Notes (Signed)
Chief Complaint  Patient presents with  . Follow-up    on neuropathy and depression. Feels like increase on cymbalta to 60 has her feeling like a "zombie" over the last month. Only took 40mg  last night. Very weak, very fatigued. Thinks she had a stomach bug and is left with a soreness in the stomach.     Patient presents for follow up on neuropathy. She was last seen here in March, at which time we started titrating up her cymbalta dose.  We went from 20 to 40mg  at that visit, and further increased it to 60mg  last month.  Doesn't note any improvement in neuropathy with the increase in the dose.  She is sleeping a lot since dose was increased to 60mg .   She reports that the neuropathy isn't that painful, just occasional shots of pain. It is mostly the numbness/tingling.    Patient had neuro consult (referred by ortho, after having EMG-NCV studies) earlier this month for evaluation bilateral lower extremity numbness, tingling, left greater than right foot drop, in the setting of recent chemotherapy from August December 2017 and moderate spinal stenosis at L4-5.  Neuro concluded that symptoms are likely related to combination of neuropathy and lumbar spinal stenosis. Hoping that symptoms will gradually improve with time. No other specific testing or treatment of chemotherapy neuropathy was recommended at this time. Neuro deferred lumbar general spine disease management to orthopedic surgeon. Wears AFO only if out for a walk.  She developed nausea, diarrhea, and some short-lived "spitting up bile" after returning from the beach a week ago.  She has continued bloating, abdominal discomfort.  Stools aren't back to normal.  Feels constipated, bowels are soft, but only passing small amounts "baby poop".  +gas.  She has had decreased appetite since this developed.  Ate a little better yesterday.  Had some ice cream last night. Cheese on a sandwich a couple of days ago.  No blood in stool, black bowel  movements, no mucus. No vomting in a week. No fever, chills. Some dizziness/lightheadedness today.  No raw oysters, undercooked foods.  +sick contact  She is so weak she had to lay down when getting dressed to come here today.  PMH, PSH, SH reviewed  Facility-Administered Encounter Medications as of 02/16/2017  Medication  . influenza  inactive virus vaccine (FLUZONE/FLUARIX) injection 0.5 mL   Outpatient Encounter Prescriptions as of 02/16/2017  Medication Sig Note  . Alpha-Lipoic Acid 600 MG CAPS Take 1 capsule by mouth daily.   [name] b complex vitamins capsule Take 1 capsule by mouth daily.   . Cholecalciferol (VITAMIN D) 2000 UNITS tablet Take 2,000 Units by mouth daily.   . DULoxetine (CYMBALTA) 30 MG capsule Take 1 capsule (30 mg total) by mouth daily.   [name] glucosamine-chondroitin 500-400 MG tablet Take 1 tablet by mouth 2 (two) times daily.    [name] levothyroxine (SYNTHROID, LEVOTHROID) 88 MCG tablet TAKE ONE TABLET BY MOUTH ONCE DAILY BEFORE  BREAKFAST.   [name] lidocaine-prilocaine (EMLA) cream  10/09/2016: Received from: External Pharmacy  . Magnesium 400 MG CAPS Take 1 capsule by mouth 2 (two) times daily.    . Multiple Vitamin (MULTI-VITAMIN) tablet Take 1 tablet by mouth daily.     . polyethylene glycol (MIRALAX / GLYCOLAX) packet Take 17 g by mouth 2 (two) times daily as needed for mild constipation.    . senna-docusate (SENOKOT S) 8.6-50 MG tablet Take 1 tablet by mouth 2 (two) times daily as needed for mild constipation.   [name]  terbinafine (LAMISIL) 250 MG tablet Take 1 tablet (250 mg total) by mouth daily. (Patient taking differently: Take 250 mg by mouth daily. Using every other day)   . Wheat Dextrin (BENEFIBER DRINK MIX PO) Take 1 scoop by mouth 2 (two) times daily.    . [DISCONTINUED] DULoxetine (CYMBALTA) 60 MG capsule Take 1 capsule (60 mg total) by mouth daily. 02/16/2017: Took 40mg  last evening  . MELATONIN PO Take 1 tablet by mouth at bedtime as needed.   . naproxen sodium  (ANAPROX) 220 MG tablet Take 220 mg by mouth 2 (two) times daily as needed (pain).     Allergies  Allergen Reactions  . Levaquin [Levofloxacin Hemihydrate] Other (See Comments)  . Promethazine Hcl Other (See Comments)    fainting  . Sulfa Drugs Cross Reactors Swelling   ROS:  As per HPI.  No fever, chills, headaches, URI symptoms, chest pain, shortness of breath or cough.  +dizziness, fatigue, nausea, bowel changes--no significant diarrhea now, and no further vomiting, but appetite is not back to normal yet.  Denies dysuria.  Reports not making much urine, but it isn't particularly dark or odorous.  She has been trying to drink more, but still doesn't notice much output.  In order to get urine sample today (which was VERY small), she had to drink two glasses of water.  PHYSICAL EXAM:  BP 110/64 (BP Location: Right Arm, Patient Position: Sitting, Cuff Size: Normal)   Pulse 100   Ht 5\' 6"  (1.676 m)   Wt 186 lb 12.8 oz (84.7 kg)   BMI 30.15 kg/m   Orthostatic--see extended vital signs.   Tired appearing female in good spirits, accompanied by her husband. HEENT: conjunctiva and sclera are clear, anicteric. OP is clear. Neck: no lymphadenopathy or mass Heart: Tachycardic at 100.  No murmur Lungs: clear bilaterally Abdomen: Tender at suprapubic area, as well as diffusely across lower abdomen. Normal bowel sounds, not hyperactive.  No rebound or guarding Extremities: no edema Skin: no rashes, turgor okay, no tenting Psych: normal mood, affect, hygiene and grooming Neuro: alert and oriented.  No focal weakness noted. Normal gait.  Urine dip: SG 1.020, 1+ blood, Neg leuks/nit   ASSESSMENT/PLAN:  Orthostatic hypotension - continue to push fluids, using nausea meds she has at home, if needed. Checking CBC to r/o anemia  Depression, major, single episode, mild ([facility]) - improved--was improved on the lower dose, so can titrate back down due to side effects/sedation - Plan: DULoxetine  (CYMBALTA) 30 MG capsule  Chemotherapy-induced neuropathy ([facility]) - Not tolerating higher doses of cymbalta.  Really isn't having true pain; tingling is tolerable.  Will decrease down to 30mg  dose - Plan: DULoxetine (CYMBALTA) 30 MG capsule  Other fatigue - Plan: CBC with Differential/Platelet, CBC with Differential/Platelet  Dizziness - Plan: CBC with Differential/Platelet, CBC with Differential/Platelet  Lower abdominal pain - urine okay; diffuse lower tenderness, no red flags.  Check CBC.  ?recent virus. bland/BRAT diet, no dairy - Plan: CBC with Differential/Platelet, POCT Urinalysis Dipstick, CBC with Differential/Platelet   Drink plenty of fluids to stay well hydrated. Move slowly--I don't want you jumping up quickly and fainting.  Contact us if you develop fever, blood in the urine or other new symptoms. Avoid all dairy for about a week. Consider taking a probiotic daily until bowels are back to normal.   Cut back to 40mg  of cymbalta for a week. Then decrease to 30mg .  Stay at the 30mg  dose.  Let us know if you have any  worsening depression or any burning PAIN develops.  I suspect the numbness/tingling will remain the same.

## 2017-02-17 ENCOUNTER — Inpatient Hospital Stay (HOSPITAL_COMMUNITY)
Admission: EM | Admit: 2017-02-17 | Discharge: 2017-02-20 | DRG: 872 | Disposition: A | Discharge status: Home / Self Care | Payer: Medicare Other | Attending: Internal Medicine | Admitting: Internal Medicine

## 2017-02-17 ENCOUNTER — Emergency Department (HOSPITAL_COMMUNITY): Payer: Medicare Other

## 2017-02-17 ENCOUNTER — Encounter (HOSPITAL_COMMUNITY): Payer: Self-pay | Admitting: *Deleted

## 2017-02-17 DIAGNOSIS — Z79899 Other long term (current) drug therapy: Secondary | ICD-10-CM

## 2017-02-17 DIAGNOSIS — K219 Gastro-esophageal reflux disease without esophagitis: Secondary | ICD-10-CM | POA: Diagnosis present

## 2017-02-17 DIAGNOSIS — M21372 Foot drop, left foot: Secondary | ICD-10-CM | POA: Diagnosis present

## 2017-02-17 DIAGNOSIS — R7989 Other specified abnormal findings of blood chemistry: Secondary | ICD-10-CM | POA: Diagnosis present

## 2017-02-17 DIAGNOSIS — Z882 Allergy status to sulfonamides status: Secondary | ICD-10-CM | POA: Diagnosis not present

## 2017-02-17 DIAGNOSIS — E669 Obesity, unspecified: Secondary | ICD-10-CM | POA: Diagnosis present

## 2017-02-17 DIAGNOSIS — Z923 Personal history of irradiation: Secondary | ICD-10-CM

## 2017-02-17 DIAGNOSIS — Z881 Allergy status to other antibiotic agents status: Secondary | ICD-10-CM

## 2017-02-17 DIAGNOSIS — D509 Iron deficiency anemia, unspecified: Secondary | ICD-10-CM | POA: Diagnosis present

## 2017-02-17 DIAGNOSIS — E039 Hypothyroidism, unspecified: Secondary | ICD-10-CM | POA: Diagnosis present

## 2017-02-17 DIAGNOSIS — Z90710 Acquired absence of both cervix and uterus: Secondary | ICD-10-CM | POA: Diagnosis not present

## 2017-02-17 DIAGNOSIS — Z8542 Personal history of malignant neoplasm of other parts of uterus: Secondary | ICD-10-CM

## 2017-02-17 DIAGNOSIS — Z87891 Personal history of nicotine dependence: Secondary | ICD-10-CM | POA: Diagnosis not present

## 2017-02-17 DIAGNOSIS — A419 Sepsis, unspecified organism: Secondary | ICD-10-CM | POA: Diagnosis present

## 2017-02-17 DIAGNOSIS — Z9109 Other allergy status, other than to drugs and biological substances: Secondary | ICD-10-CM | POA: Diagnosis not present

## 2017-02-17 DIAGNOSIS — T451X5A Adverse effect of antineoplastic and immunosuppressive drugs, initial encounter: Secondary | ICD-10-CM | POA: Diagnosis present

## 2017-02-17 DIAGNOSIS — E871 Hypo-osmolality and hyponatremia: Secondary | ICD-10-CM | POA: Diagnosis present

## 2017-02-17 DIAGNOSIS — R188 Other ascites: Secondary | ICD-10-CM | POA: Diagnosis not present

## 2017-02-17 DIAGNOSIS — A491 Streptococcal infection, unspecified site: Secondary | ICD-10-CM | POA: Diagnosis not present

## 2017-02-17 DIAGNOSIS — D649 Anemia, unspecified: Secondary | ICD-10-CM | POA: Diagnosis present

## 2017-02-17 DIAGNOSIS — G620 Drug-induced polyneuropathy: Secondary | ICD-10-CM | POA: Diagnosis present

## 2017-02-17 DIAGNOSIS — A408 Other streptococcal sepsis: Secondary | ICD-10-CM | POA: Diagnosis not present

## 2017-02-17 LAB — COMPREHENSIVE METABOLIC PANEL
ALBUMIN: 2.5 g/dL — AB (ref 3.5–5.0)
ALT: 10 U/L — ABNORMAL LOW (ref 14–54)
AST: 17 U/L (ref 15–41)
Alkaline Phosphatase: 83 U/L (ref 38–126)
Anion gap: 12 (ref 5–15)
BUN: 10 mg/dL (ref 6–20)
CHLORIDE: 95 mmol/L — AB (ref 101–111)
CO2: 25 mmol/L (ref 22–32)
CREATININE: 0.82 mg/dL (ref 0.44–1.00)
Calcium: 8.1 mg/dL — ABNORMAL LOW (ref 8.9–10.3)
GFR calc Af Amer: >60 mL/min (ref >60)
GFR calc non Af Amer: >60 mL/min (ref >60)
Glucose, Bld: 106 mg/dL — ABNORMAL HIGH (ref 65–99)
POTASSIUM: 3.7 mmol/L (ref 3.5–5.1)
SODIUM: 132 mmol/L — AB (ref 135–145)
Total Bilirubin: 0.5 mg/dL (ref 0.3–1.2)
Total Protein: 6.8 g/dL (ref 6.5–8.1)

## 2017-02-17 LAB — CBC WITH DIFFERENTIAL/PLATELET
BASOS PCT: 0 %
Basophils Absolute: 0 10*3/uL (ref 0.0–0.1)
EOS ABS: 0 10*3/uL (ref 0.0–0.7)
Eosinophils Relative: 0 %
HEMATOCRIT: 32.2 % — AB (ref 36.0–46.0)
Hemoglobin: 11 g/dL — ABNORMAL LOW (ref 12.0–15.0)
Lymphocytes Relative: 6 %
Lymphs Abs: 1 10*3/uL (ref 0.7–4.0)
MCH: 29.5 pg (ref 26.0–34.0)
MCHC: 34.2 g/dL (ref 30.0–36.0)
MCV: 86.3 fL (ref 78.0–100.0)
MONOS PCT: 5 %
Monocytes Absolute: 0.7 10*3/uL (ref 0.1–1.0)
NEUTROS ABS: 14.6 10*3/uL — AB (ref 1.7–7.7)
Neutrophils Relative %: 89 %
Platelets: 507 10*3/uL — ABNORMAL HIGH (ref 150–400)
RBC: 3.73 MIL/uL — ABNORMAL LOW (ref 3.87–5.11)
RDW: 13.7 % (ref 11.5–15.5)
WBC: 16.4 10*3/uL — ABNORMAL HIGH (ref 4.0–10.5)

## 2017-02-17 LAB — CG4 I-STAT (LACTIC ACID): Lactic Acid, Venous: 0.7 mmol/L (ref 0.5–1.9)

## 2017-02-17 LAB — URINALYSIS, ROUTINE W REFLEX MICROSCOPIC
BACTERIA UA: NONE SEEN
BILIRUBIN URINE: NEGATIVE
Glucose, UA: NEGATIVE mg/dL
Ketones, ur: NEGATIVE mg/dL
Leukocytes, UA: NEGATIVE
NITRITE: NEGATIVE
PH: 7 (ref 5.0–8.0)
Protein, ur: NEGATIVE mg/dL
SPECIFIC GRAVITY, URINE: 1.005 (ref 1.005–1.030)
Squamous Epithelial / LPF: NONE SEEN

## 2017-02-17 MED ORDER — ONDANSETRON HCL 4 MG/2ML IJ SOLN: 4.0000 mg | Freq: Four times a day (QID) | INTRAMUSCULAR | Status: DC | PRN

## 2017-02-17 MED ORDER — SODIUM CHLORIDE 0.9 % IV BOLUS (SEPSIS)
1000.0000 mL | Freq: Once | INTRAVENOUS | Status: AC
  Administered 2017-02-17: 1000 mL via INTRAVENOUS

## 2017-02-17 MED ORDER — ENSURE ENLIVE PO LIQD: 237.0000 mL | Freq: Two times a day (BID) | ORAL | Status: DC

## 2017-02-17 MED ORDER — MAGNESIUM OXIDE 400 (241.3 MG) MG PO TABS
400.0000 mg | ORAL_TABLET | Freq: Two times a day (BID) | ORAL | Status: DC
  Administered 2017-02-17 – 2017-02-20 (×6): 400 mg via ORAL
  Filled 2017-02-17 (×6): qty 1

## 2017-02-17 MED ORDER — SODIUM CHLORIDE 0.9 % IV BOLUS (SEPSIS)
1000.0000 mL | Freq: Once | INTRAVENOUS | Status: AC
  Administered 2017-02-18: 1000 mL via INTRAVENOUS

## 2017-02-17 MED ORDER — PIPERACILLIN-TAZOBACTAM 3.375 G IVPB 30 MIN
3.3750 g | INTRAVENOUS | Status: AC
  Administered 2017-02-17: 3.375 g via INTRAVENOUS
  Filled 2017-02-17: qty 50

## 2017-02-17 MED ORDER — SODIUM CHLORIDE 0.9 % IV BOLUS (SEPSIS): 1000.0000 mL | Freq: Once | INTRAVENOUS | Status: DC

## 2017-02-17 MED ORDER — LEVOTHYROXINE SODIUM 88 MCG PO TABS
88.0000 ug | ORAL_TABLET | Freq: Every day | ORAL | Status: DC
  Administered 2017-02-18 – 2017-02-20 (×3): 88 ug via ORAL
  Filled 2017-02-17 (×3): qty 1

## 2017-02-17 MED ORDER — KETOROLAC TROMETHAMINE 15 MG/ML IJ SOLN
15.0000 mg | Freq: Once | INTRAMUSCULAR | Status: AC
  Administered 2017-02-17: 15 mg via INTRAVENOUS
  Filled 2017-02-17: qty 1

## 2017-02-17 MED ORDER — PIPERACILLIN-TAZOBACTAM 3.375 G IVPB
3.3750 g | Freq: Three times a day (TID) | INTRAVENOUS | Status: DC
  Administered 2017-02-18 – 2017-02-20 (×8): 3.375 g via INTRAVENOUS
  Filled 2017-02-17 (×9): qty 50

## 2017-02-17 MED ORDER — ENOXAPARIN SODIUM 40 MG/0.4ML ~~LOC~~ SOLN
40.0000 mg | SUBCUTANEOUS | Status: DC
  Administered 2017-02-17: 40 mg via SUBCUTANEOUS
  Filled 2017-02-17: qty 0.4

## 2017-02-17 MED ORDER — FENTANYL CITRATE (PF) 100 MCG/2ML IJ SOLN
25.0000 ug | Freq: Once | INTRAMUSCULAR | Status: AC
  Administered 2017-02-17: 25 ug via INTRAVENOUS
  Filled 2017-02-17: qty 2

## 2017-02-17 MED ORDER — SENNOSIDES-DOCUSATE SODIUM 8.6-50 MG PO TABS: 1.0000 | ORAL_TABLET | Freq: Two times a day (BID) | ORAL | Status: DC | PRN

## 2017-02-17 MED ORDER — MAGNESIUM 400 MG PO CAPS: 1.0000 | ORAL_CAPSULE | Freq: Two times a day (BID) | ORAL | Status: DC

## 2017-02-17 MED ORDER — IOPAMIDOL (ISOVUE-300) INJECTION 61%
INTRAVENOUS | Status: AC
  Administered 2017-02-17: 100 mL
  Filled 2017-02-17: qty 100

## 2017-02-17 MED ORDER — ONDANSETRON HCL 4 MG/2ML IJ SOLN
4.0000 mg | Freq: Once | INTRAMUSCULAR | Status: AC
  Administered 2017-02-17: 4 mg via INTRAVENOUS
  Filled 2017-02-17: qty 2

## 2017-02-17 MED ORDER — VANCOMYCIN HCL IN DEXTROSE 1-5 GM/200ML-% IV SOLN
1000.0000 mg | Freq: Two times a day (BID) | INTRAVENOUS | Status: DC
  Administered 2017-02-18 – 2017-02-19 (×3): 1000 mg via INTRAVENOUS
  Filled 2017-02-17 (×2): qty 200

## 2017-02-17 MED ORDER — FENTANYL CITRATE (PF) 100 MCG/2ML IJ SOLN
25.0000 ug | INTRAMUSCULAR | Status: DC | PRN
  Administered 2017-02-18 – 2017-02-19 (×4): 25 ug via INTRAVENOUS
  Filled 2017-02-17 (×4): qty 2

## 2017-02-17 MED ORDER — FAMOTIDINE 20 MG PO TABS
20.0000 mg | ORAL_TABLET | Freq: Two times a day (BID) | ORAL | Status: DC
  Administered 2017-02-17 – 2017-02-20 (×6): 20 mg via ORAL
  Filled 2017-02-17 (×6): qty 1

## 2017-02-17 MED ORDER — POLYETHYLENE GLYCOL 3350 17 G PO PACK
17.0000 g | PACK | Freq: Two times a day (BID) | ORAL | Status: DC | PRN
  Administered 2017-02-19 – 2017-02-20 (×2): 17 g via ORAL
  Filled 2017-02-17 (×2): qty 1

## 2017-02-17 MED ORDER — VANCOMYCIN HCL IN DEXTROSE 1-5 GM/200ML-% IV SOLN
1000.0000 mg | INTRAVENOUS | Status: AC
  Administered 2017-02-17: 1000 mg via INTRAVENOUS
  Filled 2017-02-17: qty 200

## 2017-02-17 MED ORDER — DULOXETINE HCL 30 MG PO CPEP
30.0000 mg | ORAL_CAPSULE | Freq: Every day | ORAL | Status: DC
  Administered 2017-02-18 – 2017-02-19 (×2): 30 mg via ORAL
  Filled 2017-02-17 (×3): qty 1

## 2017-02-17 MED ORDER — ONDANSETRON HCL 4 MG PO TABS: 4.0000 mg | ORAL_TABLET | Freq: Four times a day (QID) | ORAL | Status: DC | PRN

## 2017-02-17 MED ORDER — VITAMIN D 1000 UNITS PO TABS
2000.0000 [IU] | ORAL_TABLET | Freq: Every day | ORAL | Status: DC
  Administered 2017-02-18 – 2017-02-20 (×3): 2000 [IU] via ORAL
  Filled 2017-02-17 (×3): qty 2

## 2017-02-17 MED ORDER — SODIUM CHLORIDE 0.9 % IV BOLUS (SEPSIS)
500.0000 mL | Freq: Once | INTRAVENOUS | Status: AC
  Administered 2017-02-17: 500 mL via INTRAVENOUS

## 2017-02-17 NOTE — ED Provider Notes (Signed)
[facility] DEPT Provider Note   CSN: [id number] Arrival date & time: 02/17/17  1420     History   Chief Complaint Chief Complaint  Patient presents with  . Abnormal Lab    WBCs elevated  . Abdominal Pain    HPI Kayla Guerrero is a 82 y.o. female.  HPI   Patient is a 82 year old female with history of endometrial cancer, last therapy done in November presenting today with abdominal pain and weakness. Patient with her primary care physician yesterday was found to have elevated white count and sent here to the emergency department for any workup.  2 weeks ago patient increased her Cymbalta followed by 3-4 days of GI upset including vomiting 2 and diarrhea 2 days. Patient also became very somnolent. So she stopped taking Cymbaklta at this high dose and decrease the dose 2 days ago.  However patient continued to feel weak and have abdominal pain.   Past Medical History:  Diagnosis Date  . Arthritis   . Arthritis of knee, left   . Borderline osteopenia DEXA 2006 and 2012  . Cancer [facility])    endometrial  . Cataract   . Cholesterol serum elevated   . Colon polyps   . DDD (degenerative disc disease), lumbar   . FHx: colon cancer   . GERD (gastroesophageal reflux disease)   . History of radiation therapy 08/06/16 - 09/03/16   vaginal cuff treated to 30 Gy in 5 fractions  . Hypothyroidism   . Obesity     Patient Active Problem List   Diagnosis Date Noted  . Kidney lesion, native, right 01/08/2017  . Left foot drop 01/08/2017  . Leukopenia due to antineoplastic chemotherapy ([place]) 09/29/2016  . Pain in both lower extremities 09/29/2016  . Pelvic fluid collection 09/29/2016  . Port-a-cath in place 08/27/2016  . Syncope 08/10/2016  . Sympathotonic orthostatic hypotension 08/02/2016  . Chemotherapy-induced peripheral neuropathy ([place]) 07/12/2016  . Chemotherapy induced nausea and vomiting 07/12/2016  . Chemotherapy-induced neuropathy ([place]) 07/02/2016  . Chemotherapy  induced neutropenia ([place]) 06/20/2016  . Poor venous access 06/20/2016  . Osteoarthritis of multiple joints 06/06/2016  . Neuropathy 06/06/2016  . Acute postoperative pain of left groin 05/16/2016  . Malignant neoplasm of endometrium ([place]) 04/23/2016  . Arthritis of knee 10/06/2013  . Macular degeneration 12/19/2012  . Left knee pain 09/15/2012  . Osteopenia 09/15/2012  . Pure hypercholesterolemia 06/11/2011  . Thyroid activity decreased 06/11/2011    Past Surgical History:  Procedure Laterality Date  . CATARACT EXTRACTION, BILATERAL    . CHOLECYSTECTOMY    . COLONOSCOPY  2010, 04/2013   [facility]  . IR GENERIC HISTORICAL  08/11/2016   IR FLUORO GUIDE PORT INSERTION RIGHT 08/11/2016 Greggory Keen, MD [facility]-INTER[facility] RAD  . IR GENERIC HISTORICAL  08/11/2016   IR US GUIDE VASC ACCESS RIGHT 08/11/2016 Greggory Keen, MD [facility]-INTER[facility]  . PTERYGIUM EXCISION    . ROBOTIC ASSISTED TOTAL HYSTERECTOMY WITH BILATERAL SALPINGO OOPHERECTOMY N/A 05/06/2016   Procedure: XI ROBOTIC ASSISTED  LAPAROSCOPIC TOTAL HYSTERECTOMY WITH BILATERAL SALPINGO OOPHORECTOMY;  Surgeon: Nancy Marus, MD;  Location: [facility] ORS;  Service: Gynecology;  Laterality: N/A;  . SENTINEL NODE BIOPSY N/A 05/06/2016   Procedure: SENTINEL NODE BIOPSY;  Surgeon: Nancy Marus, MD;  Location: [facility] ORS;  Service: Gynecology;  Laterality: N/A;  . TUBAL LIGATION      OB History    Gravida Para Term Preterm AB Living   3 2     1 1    SAB TAB Ectopic  Multiple Live Births   1               Home Medications    Prior to Admission medications   Medication Sig Start Date End Date Taking? Authorizing Provider  Alpha-Lipoic Acid 600 MG CAPS Take 1 capsule by mouth daily.   Yes [provider]  b complex vitamins capsule Take 1 capsule by mouth daily.   Yes [provider]  Cholecalciferol (VITAMIN D) 2000 UNITS tablet Take 2,000 Units by mouth daily.   Yes [provider]  DULoxetine (CYMBALTA) 30 MG capsule Take 1 capsule  (30 mg total) by mouth daily. 02/16/17  Yes Rita Ohara, MD  glucosamine-chondroitin 500-400 MG tablet Take 1 tablet by mouth 2 (two) times daily.    Yes [provider]  levothyroxine (SYNTHROID, LEVOTHROID) 88 MCG tablet TAKE ONE TABLET BY MOUTH ONCE DAILY BEFORE  BREAKFAST. 01/06/17  Yes Rita Ohara, MD  Magnesium 400 MG CAPS Take 1 capsule by mouth 2 (two) times daily.    Yes [provider]  MELATONIN PO Take 1 tablet by mouth at bedtime as needed.   Yes [provider]  Multiple Vitamin (MULTI-VITAMIN) tablet Take 1 tablet by mouth daily.     Yes [provider]  naproxen sodium (ANAPROX) 220 MG tablet Take 220 mg by mouth 2 (two) times daily as needed (pain).    Yes [provider]  polyethylene glycol (MIRALAX / GLYCOLAX) packet Take 17 g by mouth 2 (two) times daily as needed for mild constipation.    Yes [provider]  senna-docusate (SENOKOT S) 8.6-50 MG tablet Take 1 tablet by mouth 2 (two) times daily as needed for mild constipation.   Yes [provider]  terbinafine (LAMISIL) 250 MG tablet Take 1 tablet (250 mg total) by mouth daily. Patient taking differently: Take 250 mg by mouth daily. Using every other day 12/23/16  Yes Hyatt, Max T, DPM  Wheat Dextrin (BENEFIBER DRINK MIX PO) Take 1 scoop by mouth 2 (two) times daily.    Yes [provider]  lidocaine-prilocaine (EMLA) cream  08/08/16   [provider]    Family History Family History  Problem Relation Age of Onset  . Cancer Mother 76       COLON   . Heart disease Mother   . Cancer Maternal Aunt   . Breast cancer Maternal Aunt   . Mental illness Maternal Uncle   . Cancer Maternal Grandmother        stomach  . Macular degeneration Maternal Grandmother   . Diabetes Neg Hx     Social History Social History  Substance Use Topics  . Smoking status: Former Smoker    Packs/day: 1.00    Types: Cigarettes    Quit date: 09/29/1980  . Smokeless  tobacco: Never Used  . Alcohol use 0.0 oz/week     Comment: 1 glass of wine 3-4 times/week; hard liquor (highball) or a beer once a week     Allergies   Levaquin [levofloxacin hemihydrate]; Promethazine hcl; and Sulfa drugs cross reactors   Review of Systems Review of Systems  Constitutional: Positive for fatigue. Negative for activity change and fever.  Respiratory: Negative for shortness of breath.   Cardiovascular: Negative for chest pain.  Gastrointestinal: Positive for abdominal pain, nausea and vomiting.  Neurological: Positive for light-headedness. Negative for weakness.  All other systems reviewed and are negative.    Physical Exam Updated Vital Signs BP 135/66 (BP Location: Left  Arm)   Pulse 60   Temp 98.6 F (37 C) (Oral)   Resp 16   SpO2 94%   Physical Exam  Constitutional: She is oriented to person, place, and time. She appears well-developed and well-nourished.  HENT:  Head: Normocephalic and atraumatic.  Eyes: Right eye exhibits no discharge.  Cardiovascular: Normal rate, regular rhythm and normal heart sounds.   No murmur heard. Pulmonary/Chest: Effort normal and breath sounds normal. She has no wheezes. She has no rales.  Port and right upper chest wall.  Abdominal: Soft. She exhibits no distension. There is tenderness.  Right lower quadrant tenderness.  Neurological: She is oriented to person, place, and time.  Skin: Skin is warm and dry. She is not diaphoretic.  Psychiatric: She has a normal mood and affect.  Nursing note and vitals reviewed.    ED Treatments / Results  Labs (all labs ordered are listed, but only abnormal results are displayed) Labs Reviewed  COMPREHENSIVE METABOLIC PANEL - Abnormal; Notable for the following:       Result Value   Sodium 132 (*)    Chloride 95 (*)    Glucose, Bld 106 (*)    Calcium 8.1 (*)    Albumin 2.5 (*)    ALT 10 (*)    All other components within normal limits  CBC WITH DIFFERENTIAL/PLATELET -  Abnormal; Notable for the following:    WBC 16.4 (*)    RBC 3.73 (*)    Hemoglobin 11.0 (*)    HCT 32.2 (*)    Platelets 507 (*)    Neutro Abs 14.6 (*)    All other components within normal limits  URINALYSIS, ROUTINE W REFLEX MICROSCOPIC - Abnormal; Notable for the following:    Hgb urine dipstick SMALL (*)    All other components within normal limits  I-STAT CG4 LACTIC ACID, ED  CG4 I-STAT (LACTIC ACID)  I-STAT CG4 LACTIC ACID, ED    EKG  EKG Interpretation None       Radiology Dg Chest 2 View  Result Date: 02/17/2017 CLINICAL DATA:  Shortness of breath for a couple of days with some dizziness. Former smoker. EXAM: CHEST  2 VIEW COMPARISON:  Chest CTA 07/29/2016 FINDINGS: Right jugular Port-A-Cath terminates over the lower SVC there is a small hiatal hernia. The cardiac silhouette is normal in size. Aortic atherosclerosis is noted. There is anterior eventration of the right hemidiaphragm. The lungs are clear. No pleural effusion or pneumothorax is identified. Thoracic spondylosis and chronic retrolisthesis of T12 on L1 are noted. IMPRESSION: No active cardiopulmonary disease. Electronically Signed   By: Logan Bores M.D.   On: 02/17/2017 15:21   Ct Abdomen Pelvis W Contrast  Result Date: 02/17/2017 CLINICAL DATA:  Right lower quadrant pain and weakness history of endometrial cancer EXAM: CT ABDOMEN AND PELVIS WITH CONTRAST TECHNIQUE: Multidetector CT imaging of the abdomen and pelvis was performed using the standard protocol following bolus administration of intravenous contrast. CONTRAST:  164mL ISOVUE-300 IOPAMIDOL (ISOVUE-300) INJECTION 61% COMPARISON:  11/19/2016, 09/23/2016 FINDINGS: Lower chest: Lung bases demonstrate no acute consolidation or pleural effusion. Heart size nonenlarged. Hepatobiliary: Stable subcentimeter hypodense lesions in the liver. Mild intra hepatic and extrahepatic biliary dilatation post cholecystectomy, also unchanged. Pancreas: Unremarkable. No  pancreatic ductal dilatation or surrounding inflammatory changes. Spleen: Normal in size without focal abnormality. Adrenals/Urinary Tract: Adrenal glands are unremarkable. Kidneys show no hydronephrosis. Intermediate density lesion measuring 14 mm posterior cortex of the mid right kidney. Bladder is displaced to the left and  appears slightly thick walled. Stomach/Bowel: Stomach nonenlarged. No dilated small bowel. Diffuse diverticular disease of the colon without acute inflammation. Vascular/Lymphatic: Aortic atherosclerosis. No significantly enlarged abdominal or pelvic lymph nodes. Reproductive: Status post hysterectomy. Other: No free air. No significant free fluid. Fat in the umbilicus. A previously stable thin walled cystic lesion in the right pelvis now demonstrates thick wall enhancement and has increased in size, now measuring 7.5 x 5.8 x 7.1 cm. There is edema and stranding within the fat surrounding the mass. Adjacent soft tissue thickening along the right pelvic sidewall. Mass effect on the bladder which is also slightly thick walled. Musculoskeletal: Degenerative changes at multiple levels. No acute or suspicious bone lesion. IMPRESSION: 1. The previously noted thin walled cystic lesion in the right pelvis has undergone change in appearance ; it is now thick-walled and rim enhancing. There is associated inflammation and edema surrounding the pelvic sidewall lesion which has also increased in size. This suggests interim inflammation/infection of the cystic collection. 2. Urinary bladder displaced to the left by the thick-walled right pelvic sidewall cystic lesion. Wall thickening of the bladder is likely reactive but could also be due to a cystitis. Suggest correlation with urinalysis. 3. Stable intra and extrahepatic biliary dilatation post cholecystectomy 4. Stable 13 mm intermediate density right renal lesion 5. Diffuse diverticular disease of the colon without acute inflammation Electronically Signed    By: Donavan Foil M.D.   On: 02/17/2017 18:08    Procedures Procedures (including critical care time)  Medications Ordered in ED Medications  sodium chloride 0.9 % bolus 1,000 mL (0 mLs Intravenous Stopped 02/17/17 1728)  fentaNYL (SUBLIMAZE) injection 25 mcg (25 mcg Intravenous Given 02/17/17 1541)  ondansetron (ZOFRAN) injection 4 mg (4 mg Intravenous Given 02/17/17 1541)  iopamidol (ISOVUE-300) 61 % injection (100 mLs  Contrast Given 02/17/17 1730)     Initial Impression / Assessment and Plan / ED Course  I have reviewed the triage vital signs and the nursing notes.  Pertinent labs & imaging results that were available during my care of the patient were reviewed by me and considered in my medical decision making (see chart for details).     Well-appearing 82 year old female presenting with abdominal pain. We'll do labs, access port, give fluids and nausea medicine pain medicine and CT abdomen pelvis.  7:42 PM CT shows a cystic structure with mass effect on the bladder. This cystic structure was present for a long period of time. She had a draining back in January done by interventional radiologist. It showed just lymphocytic material. It was called to be a lymphocoele, noninfected. In the interim it is been getting larger. Today it is much larger and now has a thickened wall with fluid around it showing signs of infection. It is likely the cause of her abdominal pain white count and low-grade fever. We'll admit to medicine with post surgery and VIR consulting.  I discussed wtih doctor Dr. Ninfa Linden from surgery who will consult on the patient in the morning. I talked to Dr. Earleen Newport from IR who recommends putting the consult and they will see possible IR drainage tomorrow. Hold blood thinners tonight.  Final Clinical Impressions(s) / ED Diagnoses   Final diagnoses:  None    New Prescriptions New Prescriptions   No medications on file     Macarthur Critchley, MD 02/17/17  1943

## 2017-02-17 NOTE — Progress Notes (Signed)
Pharmacy Antibiotic Note  Kayla Guerrero is a 82 y.o. female admitted on 02/17/2017 with abdominal infection/sepsis.  Patient directed to ED for elevated WBC, abdominal pain and weakness.  PMH significant for endometrial cancer. CT shows cystic lesion with signs of infection. Vancomycin 1gm and Zosyn 3.375gm IV x 1 dose each ordered to be given in the ED.  Pharmacy has been consulted for Vancomycin and Zosyn dosing.  Plan:  Vancomycin 1gm IV q12h  Zosyn 3.375gm IV q8h (each dose infused over 4 hrs)  Daily SCr  Follow clinical course, renal function and check vancomycin level when appropriate   Temp (24hrs), Avg:98.6 F (37 C), Min:98.6 F (37 C), Max:98.6 F (37 C)   Recent Labs Lab 02/16/17 1202 02/17/17 1527 02/17/17 1531  WBC 16.2* 16.4*  --   CREATININE  --  0.82  --   LATICACIDVEN  --   --  0.70    Estimated Creatinine Clearance: 60 mL/min (by C-G formula based on SCr of 0.82 mg/dL).    Allergies  Allergen Reactions  . Levaquin [Levofloxacin Hemihydrate] Other (See Comments)  . Promethazine Hcl Other (See Comments)    fainting  . Sulfa Drugs Cross Reactors Swelling    Antimicrobials this admission: 5/22 vanc >>   5/22 zosyn >>    Dose adjustments this admission:    Microbiology results:  Thank you for allowing pharmacy to be a part of this patient's care.  Everette Rank, PharmD 02/17/2017 7:55 PM

## 2017-02-17 NOTE — H&P (Addendum)
History and Physical    Kayla Guerrero MBT:[id number] DOB: 05/28/1935 DOA: 02/17/2017  PCP: Rita Ohara, MD   Patient coming from: Home.  I have personally briefly reviewed patient's old medical records in [place]  Chief Complaint: Abdominal pain and diarrhea for 4 days.  HPI: Kayla Guerrero is a 82 y.o. female with medical history significant of osteoarthritis, spinal stenosis, bilateral lower extremity neuropathy, endometrial cancer, depression, history of radiation therapy, cataracts, hyperlipidemia, colonic polyps, GERD, hypothyroidism, obesity, osteopenia who is coming to the emergency department after oncology follow-up due to lower abdominal pain, diarrhea for 4 days associated with weakness, hypersomnolence following an episode of a gastroenteritis about a week ago after returning from the beach. She saw Dr. Kelby Aline this morning who referred her to the emergency department due to above symptoms, orthostatic hypotension and leukocytosis.   Per patient, she has been having abdominal pain for about 4 days. She initially had dry heaving with an episode of water and bile emesis. She denies further emesis since then. She has had diarrhea alternating with constipation. She denies melena, hematochezia, dysuria or frequency, but states that she has been urinating very little volume compared to usual in the past 2 days. Other symptoms have been fever, dizziness, fatigue, dry cough, decreased appetite and hypersomnolence. She was told to decrease her Cymbalta to decrease somnolence. She denies chest pain, palpitations, dyspnea, PND, orthopnea or pitting edema of the lower extremities.  ED Course: The patient received normal saline 1 L bolus, fentanyl 25 g IVP, Zofran 4 mg IVP, vancomycin and Zosyn in the emergency department.   Lab work: Urinalysis showed small hemoglobinuria. WBC 16.4 with 89% neutrophils, hemoglobin 11 g/dL and platelets 507. Sodium 132, potassium 3.7, chloride 95,  bicarbonate 25 and lactic acid 0.70 mmol/L. BUN 10, creatinine 0.82 and glucose 106 mg/dL.   Imaging: Chest radiograph does not show active cardiopulmonary disease. CT abdomen/pelvis with contrast showed that the previously seen thinwall cystic lesions in the right pelvis is now take wall and ring-enhancing with associated inflammation and edema surrounding the pelvic sidewall lesions which also has increased in size. This suggests inter-inflammation/infection of the cystic collection. The urinary bladder is displaced to the left by these lesions. Please see images and full radiology report for further detail.  Review of Systems: As per HPI otherwise 10 point review of systems negative.    Past Medical History:  Diagnosis Date  . Arthritis   . Arthritis of knee, left   . Borderline osteopenia DEXA 2006 and 2012  . [facility])    endometrial  . Cataract   . Cholesterol serum elevated   . Colon polyps   . DDD (degenerative disc disease), lumbar   . FHx: colon cancer   . GERD (gastroesophageal reflux disease)   . History of radiation therapy 08/06/16 - 09/03/16   vaginal cuff treated to 30 Gy in 5 fractions  . Hypothyroidism   . Obesity     Past Surgical History:  Procedure Laterality Date  . CATARACT EXTRACTION, BILATERAL    . CHOLECYSTECTOMY    . COLONOSCOPY  2010, 04/2013   Buccini  . IR GENERIC HISTORICAL  08/11/2016   IR FLUORO GUIDE PORT INSERTION RIGHT 08/11/2016 Greggory Keen, MD [facility]  . IR GENERIC HISTORICAL  08/11/2016   IR US GUIDE VASC ACCESS RIGHT 08/11/2016 Greggory Keen, MD [facility]  . PTERYGIUM EXCISION    . ROBOTIC ASSISTED TOTAL HYSTERECTOMY WITH BILATERAL SALPINGO OOPHERECTOMY N/A 05/06/2016  Procedure: XI ROBOTIC ASSISTED  LAPAROSCOPIC TOTAL HYSTERECTOMY WITH BILATERAL SALPINGO OOPHORECTOMY;  Surgeon: Nancy Marus, MD;  Location: [facility] ORS;  Service: Gynecology;  Laterality: N/A;  . SENTINEL NODE BIOPSY N/A 05/06/2016   Procedure: SENTINEL NODE BIOPSY;   Surgeon: Nancy Marus, MD;  Location: [facility];  Service: Gynecology;  Laterality: N/A;  . TUBAL LIGATION       reports that she quit smoking about 36 years ago. Her smoking use included Cigarettes. She smoked 1.00 pack per day. She has never used smokeless tobacco. She reports that she drinks alcohol. She reports that she does not use drugs.  Allergies  Allergen Reactions  . Levaquin [Levofloxacin Hemihydrate] Other (See Comments)  . Promethazine Hcl Other (See Comments)    fainting  . Sulfa Drugs Cross Reactors Swelling    Family History  Problem Relation Age of Onset  . Cancer Mother 61       COLON   . Heart disease Mother   . Cancer Maternal Aunt   . Breast cancer Maternal Aunt   . Mental illness Maternal Uncle   . Cancer Maternal Grandmother        stomach  . Macular degeneration Maternal Grandmother   . Diabetes Neg Hx     Prior to Admission medications   Medication Sig Start Date End Date Taking? Authorizing Provider  Alpha-Lipoic Acid 600 MG CAPS Take 1 capsule by mouth daily.   Yes [provider]  b complex vitamins capsule Take 1 capsule by mouth daily.   Yes [provider]  Cholecalciferol (VITAMIN D) 2000 UNITS tablet Take 2,000 Units by mouth daily.   Yes [provider]  DULoxetine (CYMBALTA) 30 MG capsule Take 1 capsule (30 mg total) by mouth daily. 02/16/17  Yes Rita Ohara, MD  glucosamine-chondroitin 500-400 MG tablet Take 1 tablet by mouth 2 (two) times daily.    Yes [provider]  levothyroxine (SYNTHROID, LEVOTHROID) 88 MCG tablet TAKE ONE TABLET BY MOUTH ONCE DAILY BEFORE  BREAKFAST. 01/06/17  Yes Rita Ohara, MD  Magnesium 400 MG CAPS Take 1 capsule by mouth 2 (two) times daily.    Yes [provider]  MELATONIN PO Take 1 tablet by mouth at bedtime as needed.   Yes [provider]  Multiple Vitamin (MULTI-VITAMIN) tablet Take 1 tablet by mouth daily.     Yes [provider]  naproxen sodium  (ANAPROX) 220 MG tablet Take 220 mg by mouth 2 (two) times daily as needed (pain).    Yes [provider]  polyethylene glycol (MIRALAX / GLYCOLAX) packet Take 17 g by mouth 2 (two) times daily as needed for mild constipation.    Yes [provider]  senna-docusate (SENOKOT S) 8.6-50 MG tablet Take 1 tablet by mouth 2 (two) times daily as needed for mild constipation.   Yes [provider]  terbinafine (LAMISIL) 250 MG tablet Take 1 tablet (250 mg total) by mouth daily. Patient taking differently: Take 250 mg by mouth daily. Using every other day 12/23/16  Yes Hyatt, Max T, DPM  Wheat Dextrin (BENEFIBER DRINK MIX PO) Take 1 scoop by mouth 2 (two) times daily.    Yes [provider]  lidocaine-prilocaine (EMLA) cream  08/08/16   [provider]    Physical Exam: Vitals:   02/17/17 1428 02/17/17 1839 02/17/17 1841 02/17/17 2115  BP: 125/69  135/66 (!) 140/58  Pulse: 99 60 60 98  Resp: 15  16 16   Temp: 98.6 F (37 C)   (!)  100.4 F (38 C)  TempSrc: Oral   Oral  SpO2: 99%  94% 98%    Constitutional: Febrile, but otherwise in NAD Eyes: PERRL, lids and conjunctivae normal ENMT: Mucous membranes are dry. Posterior pharynx clear of any exudate or lesions. Neck: normal, supple, no masses, no thyromegaly Respiratory: clear to auscultation bilaterally, no wheezing, no crackles. Normal respiratory effort. No accessory muscle use.  Chest: Positive port in right upper chest wall.  Cardiovascular: Regular rate and rhythm, no murmurs / rubs / gallops. No extremity edema. 2+ pedal pulses. No carotid bruits.  Abdomen: Soft, positive RLQ tenderness, no guarding/rebound/masses palpated. No hepatosplenomegaly. Bowel sounds positive.  Musculoskeletal: no clubbing / cyanosis.  Good ROM, no contractures. Normal muscle tone.  Skin: no rashes, lesions, ulcers on limited skin exam. Neurologic: CN 2-12 grossly intact. Sensation intact, DTR normal. Strength 5/5 in all  4.  Psychiatric: Normal judgment and insight. Alert and oriented x 3. Normal mood.   Labs on Admission: I have personally reviewed following labs and imaging studies  CBC:  Recent Labs Lab 02/16/17 1202 02/17/17 1527  WBC 16.2* 16.4*  NEUTROABS 14,256* 14.6*  HGB 11.9 11.0*  HCT 36.0 32.2*  MCV 88.2 86.3  PLT 529* 638*   Basic Metabolic Panel:  Recent Labs Lab 02/17/17 1527  NA 132*  K 3.7  CL 95*  CO2 25  GLUCOSE 106*  BUN 10  CREATININE 0.82  CALCIUM 8.1*   GFR: Estimated Creatinine Clearance: 60 mL/min (by C-G formula based on SCr of 0.82 mg/dL). Liver Function Tests:  Recent Labs Lab 02/17/17 1527  AST 17  ALT 10*  ALKPHOS 83  BILITOT 0.5  PROT 6.8  ALBUMIN 2.5*   No results for input(s): LIPASE, AMYLASE in the last 168 hours. No results for input(s): AMMONIA in the last 168 hours. Coagulation Profile: No results for input(s): INR, PROTIME in the last 168 hours. Cardiac Enzymes: No results for input(s): CKTOTAL, CKMB, CKMBINDEX, TROPONINI in the last 168 hours. BNP (last 3 results) No results for input(s): PROBNP in the last 8760 hours. HbA1C: No results for input(s): HGBA1C in the last 72 hours. CBG: No results for input(s): GLUCAP in the last 168 hours. Lipid Profile: No results for input(s): CHOL, HDL, LDLCALC, TRIG, CHOLHDL, LDLDIRECT in the last 72 hours. Thyroid Function Tests: No results for input(s): TSH, T4TOTAL, FREET4, T3FREE, THYROIDAB in the last 72 hours. Anemia Panel: No results for input(s): VITAMINB12, FOLATE, FERRITIN, TIBC, IRON, RETICCTPCT in the last 72 hours. Urine analysis:    Component Value Date/Time   COLORURINE YELLOW 02/17/2017 [place] 02/17/2017 1654   LABSPEC 1.005 02/17/2017 1654   PHURINE 7.0 02/17/2017 1654   GLUCOSEU NEGATIVE 02/17/2017 1654   HGBUR SMALL (A) 02/17/2017 1654   BILIRUBINUR NEGATIVE 02/17/2017 1654   BILIRUBINUR neg 02/16/2017 1259   KETONESUR NEGATIVE 02/17/2017 1654    PROTEINUR NEGATIVE 02/17/2017 1654   UROBILINOGEN 0.2 02/16/2017 1259   NITRITE NEGATIVE 02/17/2017 1654   LEUKOCYTESUR NEGATIVE 02/17/2017 1654    Radiological Exams on Admission: Dg Chest 2 View  Result Date: 02/17/2017 CLINICAL DATA:  Shortness of breath for a couple of days with some dizziness. Former smoker. EXAM: CHEST  2 VIEW COMPARISON:  Chest CTA 07/29/2016 FINDINGS: Right jugular Port-A-Cath terminates over the lower SVC there is a small hiatal hernia. The cardiac silhouette is normal in size. Aortic atherosclerosis is noted. There is anterior eventration of the right hemidiaphragm. The lungs are clear. No pleural effusion or pneumothorax  is identified. Thoracic spondylosis and chronic retrolisthesis of T12 on L1 are noted. IMPRESSION: No active cardiopulmonary disease. Electronically Signed   By: Logan Bores M.D.   On: 02/17/2017 15:21   Ct Abdomen Pelvis W Contrast  Result Date: 02/17/2017 CLINICAL DATA:  Right lower quadrant pain and weakness history of endometrial cancer EXAM: CT ABDOMEN AND PELVIS WITH CONTRAST TECHNIQUE: Multidetector CT imaging of the abdomen and pelvis was performed using the standard protocol following bolus administration of intravenous contrast. CONTRAST:  15mL ISOVUE-300 IOPAMIDOL (ISOVUE-300) INJECTION 61% COMPARISON:  11/19/2016, 09/23/2016 FINDINGS: Lower chest: Lung bases demonstrate no acute consolidation or pleural effusion. Heart size nonenlarged. Hepatobiliary: Stable subcentimeter hypodense lesions in the liver. Mild intra hepatic and extrahepatic biliary dilatation post cholecystectomy, also unchanged. Pancreas: Unremarkable. No pancreatic ductal dilatation or surrounding inflammatory changes. Spleen: Normal in size without focal abnormality. Adrenals/Urinary Tract: Adrenal glands are unremarkable. Kidneys show no hydronephrosis. Intermediate density lesion measuring 14 mm posterior cortex of the mid right kidney. Bladder is displaced to the left and  appears slightly thick walled. Stomach/Bowel: Stomach nonenlarged. No dilated small bowel. Diffuse diverticular disease of the colon without acute inflammation. Vascular/Lymphatic: Aortic atherosclerosis. No significantly enlarged abdominal or pelvic lymph nodes. Reproductive: Status post hysterectomy. Other: No free air. No significant free fluid. Fat in the umbilicus. A previously stable thin walled cystic lesion in the right pelvis now demonstrates thick wall enhancement and has increased in size, now measuring 7.5 x 5.8 x 7.1 cm. There is edema and stranding within the fat surrounding the mass. Adjacent soft tissue thickening along the right pelvic sidewall. Mass effect on the bladder which is also slightly thick walled. Musculoskeletal: Degenerative changes at multiple levels. No acute or suspicious bone lesion. IMPRESSION: 1. The previously noted thin walled cystic lesion in the right pelvis has undergone change in appearance ; it is now thick-walled and rim enhancing. There is associated inflammation and edema surrounding the pelvic sidewall lesion which has also increased in size. This suggests interim inflammation/infection of the cystic collection. 2. Urinary bladder displaced to the left by the thick-walled right pelvic sidewall cystic lesion. Wall thickening of the bladder is likely reactive but could also be due to a cystitis. Suggest correlation with urinalysis. 3. Stable intra and extrahepatic biliary dilatation post cholecystectomy 4. Stable 13 mm intermediate density right renal lesion 5. Diffuse diverticular disease of the colon without acute inflammation Electronically Signed   By: Donavan Foil M.D.   On: 02/17/2017 18:08    EKG: Independently reviewed. N/A  Assessment/Plan Principal Problem:   Sepsis ([place]) Secondary to pelvic fluid collection. Additional NS 1500 mL bolus ordered. Continue IV fluids. Continue vancomycin per pharmacy. Continue Zosyn per pharmacy. IR will evaluate in  a.m. Please consult GYN in the morning per general surgery recommendation.  Active Problems:   Pelvic fluid collection Continue IV antibiotics. Interventional radiology will evaluate in a.m. Consult GYN in the morning, per recommendation by Dr. Ninfa Linden.    Anemia Monitor hematocrit and hemoglobin. Check anemia panel in a.m.    Hyponatremia Continue normal saline infusion. Follow-up sodium level.    Hypothyroidism Continue levothyroxine 88 g by mouth daily. TSH monitoring as an outpatient.     DVT prophylaxis: SCDs. Code Status: Full code. Family Communication:  Disposition Plan: Admit for IV antibiotic therapy, IR and OB/GYN evaluation in a.m. Consults called: Dr. Earleen Newport from IR will see patient in a.m. Dr. Ninfa Linden was consulted, but note is suggesting GYN instead of general surgery consult in the morning.  Admission status: Inpatient/telemetry.   Reubin Milan MD [facility] Pager [phone]  If 7PM-7AM, please contact night-coverage [email or web address] Password [facility]  02/17/2017, 11:25 PM

## 2017-02-17 NOTE — Progress Notes (Addendum)
Patient ID: Kayla Guerrero, female   DOB: 30-Jun-1935, 82 y.o.   MRN: [id number]   I have reviewed the patient's CT and her history in the chart.  She needs a medical admission as well as GYN consult and IR consult. There is no role for general surgery at this point. We will see as needed.

## 2017-02-17 NOTE — ED Triage Notes (Addendum)
Pt sent to ED for WBCs 16.2, c/o lower abdominal pain, diarrhea x 4 days, fatigue, weakness x 1 week, hypersomnia. Recent Hx gastroenteritis 1 week ago. No emesis, blood in stool.  Last chemo in December, last radiation in November. Endometrial cancer.

## 2017-02-18 ENCOUNTER — Encounter (HOSPITAL_COMMUNITY): Payer: Self-pay | Admitting: Radiology

## 2017-02-18 ENCOUNTER — Inpatient Hospital Stay (HOSPITAL_COMMUNITY): Payer: Medicare Other

## 2017-02-18 DIAGNOSIS — A419 Sepsis, unspecified organism: Principal | ICD-10-CM

## 2017-02-18 DIAGNOSIS — E871 Hypo-osmolality and hyponatremia: Secondary | ICD-10-CM

## 2017-02-18 DIAGNOSIS — T451X5A Adverse effect of antineoplastic and immunosuppressive drugs, initial encounter: Secondary | ICD-10-CM

## 2017-02-18 DIAGNOSIS — R188 Other ascites: Secondary | ICD-10-CM

## 2017-02-18 DIAGNOSIS — G620 Drug-induced polyneuropathy: Secondary | ICD-10-CM

## 2017-02-18 DIAGNOSIS — D509 Iron deficiency anemia, unspecified: Secondary | ICD-10-CM

## 2017-02-18 DIAGNOSIS — K219 Gastro-esophageal reflux disease without esophagitis: Secondary | ICD-10-CM

## 2017-02-18 DIAGNOSIS — E039 Hypothyroidism, unspecified: Secondary | ICD-10-CM

## 2017-02-18 LAB — BASIC METABOLIC PANEL
Anion gap: 8 (ref 5–15)
BUN: 9 mg/dL (ref 6–20)
CO2: 25 mmol/L (ref 22–32)
Calcium: 8 mg/dL — ABNORMAL LOW (ref 8.9–10.3)
Chloride: 101 mmol/L (ref 101–111)
Creatinine, Ser: 0.8 mg/dL (ref 0.44–1.00)
GFR calc Af Amer: >60 mL/min (ref >60)
GLUCOSE: 108 mg/dL — AB (ref 65–99)
POTASSIUM: 3.7 mmol/L (ref 3.5–5.1)
Sodium: 134 mmol/L — ABNORMAL LOW (ref 135–145)

## 2017-02-18 LAB — RETICULOCYTES
RBC.: 3.46 MIL/uL — ABNORMAL LOW (ref 3.87–5.11)
RETIC CT PCT: 0.6 % (ref 0.4–3.1)
Retic Count, Absolute: 20.8 10*3/uL (ref 19.0–186.0)

## 2017-02-18 LAB — CBC WITH DIFFERENTIAL/PLATELET
Basophils Absolute: 0 10*3/uL (ref 0.0–0.1)
Basophils Relative: 0 %
EOS PCT: 1 %
Eosinophils Absolute: 0.1 10*3/uL (ref 0.0–0.7)
HCT: 30 % — ABNORMAL LOW (ref 36.0–46.0)
Hemoglobin: 10.1 g/dL — ABNORMAL LOW (ref 12.0–15.0)
LYMPHS ABS: 0.8 10*3/uL (ref 0.7–4.0)
Lymphocytes Relative: 5 %
MCH: 29.2 pg (ref 26.0–34.0)
MCHC: 33.7 g/dL (ref 30.0–36.0)
MCV: 86.7 fL (ref 78.0–100.0)
MONOS PCT: 4 %
Monocytes Absolute: 0.6 10*3/uL (ref 0.1–1.0)
Neutro Abs: 15.7 10*3/uL — ABNORMAL HIGH (ref 1.7–7.7)
Neutrophils Relative %: 90 %
PLATELETS: 477 10*3/uL — AB (ref 150–400)
RBC: 3.46 MIL/uL — AB (ref 3.87–5.11)
RDW: 14.1 % (ref 11.5–15.5)
WBC: 17.2 10*3/uL — AB (ref 4.0–10.5)

## 2017-02-18 LAB — APTT: aPTT: 35 seconds (ref 24–36)

## 2017-02-18 LAB — PROTIME-INR
INR: 1.26
PROTHROMBIN TIME: 15.9 s — AB (ref 11.4–15.2)

## 2017-02-18 LAB — IRON AND TIBC
Iron: 9 ug/dL — ABNORMAL LOW (ref 28–170)
SATURATION RATIOS: 6 % — AB (ref 10.4–31.8)
TIBC: 157 ug/dL — AB (ref 250–450)
UIBC: 148 ug/dL

## 2017-02-18 LAB — FOLATE: Folate: 29 ng/mL (ref >5.9)

## 2017-02-18 LAB — FERRITIN: FERRITIN: 181 ng/mL (ref 11–307)

## 2017-02-18 LAB — VITAMIN B12: Vitamin B-12: 267 pg/mL (ref 180–914)

## 2017-02-18 MED ORDER — LIDOCAINE-EPINEPHRINE (PF) 1 %-1:200000 IJ SOLN
INTRAMUSCULAR | Status: DC | PRN
  Administered 2017-02-18: 10 mL

## 2017-02-18 MED ORDER — MIDAZOLAM HCL 2 MG/2ML IJ SOLN
INTRAMUSCULAR | Status: DC | PRN
  Administered 2017-02-18 (×2): 1 mg via INTRAVENOUS

## 2017-02-18 MED ORDER — POTASSIUM CHLORIDE IN NACL 20-0.9 MEQ/L-% IV SOLN
INTRAVENOUS | Status: DC
  Administered 2017-02-18 – 2017-02-19 (×3): via INTRAVENOUS
  Filled 2017-02-18 (×3): qty 1000

## 2017-02-18 MED ORDER — MIDAZOLAM HCL 2 MG/2ML IJ SOLN
INTRAMUSCULAR | Status: AC
Start: 2017-02-18 — End: 2017-02-19
  Filled 2017-02-18: qty 6

## 2017-02-18 MED ORDER — FENTANYL CITRATE (PF) 100 MCG/2ML IJ SOLN
INTRAMUSCULAR | Status: AC
  Filled 2017-02-18: qty 4

## 2017-02-18 MED ORDER — FENTANYL CITRATE (PF) 100 MCG/2ML IJ SOLN
INTRAMUSCULAR | Status: DC | PRN
  Administered 2017-02-18 (×2): 50 ug via INTRAVENOUS

## 2017-02-18 MED ORDER — SODIUM CHLORIDE 0.9% FLUSH
10.0000 mL | Freq: Two times a day (BID) | INTRAVENOUS | Status: DC
  Administered 2017-02-18 – 2017-02-19 (×2): 10 mL

## 2017-02-18 MED ORDER — ENOXAPARIN SODIUM 40 MG/0.4ML ~~LOC~~ SOLN
40.0000 mg | SUBCUTANEOUS | Status: DC
  Filled 2017-02-18 (×2): qty 0.4

## 2017-02-18 NOTE — Consult Note (Signed)
Chief Complaint: Patient was seen in consultation today for pelvic fluid collection  Referring Physician(s):  Dr. Gerri Lins  Supervising Physician: Sandi Mariscal  Patient Status: [facility] - In-pt  History of Present Illness: AISHI COURTS is a 82 y.o. female with past medical history of arthritis, GERD, DDD, and endometrial cancer s/p hysterectomy with BSO in 2017 who presented to [facility] with abdominal pain.  Patient has a history of large pelvic cyst which was aspirated by Dr. Laurence Ferrari 09/30/16.   CT Abd/Pelvis 02/17/17 showed: 1. The previously noted thin walled cystic lesion in the right pelvis has undergone change in appearance ; it is now thick-walled and rim enhancing. There is associated inflammation and edema surrounding the pelvic sidewall lesion which has also increased in size. This suggests interim inflammation/infection of the cystic collection. 2. Urinary bladder displaced to the left by the thick-walled right pelvic sidewall cystic lesion. Wall thickening of the bladder is likely reactive but could also be due to a cystitis. Suggest correlation with urinalysis. 3. Stable intra and extrahepatic biliary dilatation post cholecystectomy 4. Stable 13 mm intermediate density right renal lesion 5. Diffuse diverticular disease of the colon without acute Inflammation  IR consulted for aspiration and drainage of recurrent fluid collection.  Case discussed with Dr. Pascal Lux who has conferred with medical team.  OBGYN was also consulted and recommends IR drainage.   Patient is NPO.  She did receive lovenox last night.   Past Medical History:  Diagnosis Date  . Arthritis   . Arthritis of knee, left   . Borderline osteopenia DEXA 2006 and 2012  . Cancer [facility])    endometrial  . Cataract   . Cholesterol serum elevated   . Colon polyps   . DDD (degenerative disc disease), lumbar   . FHx: colon cancer   . GERD (gastroesophageal reflux disease)   . History of radiation therapy  08/06/16 - 09/03/16   vaginal cuff treated to 30 Gy in 5 fractions  . Hypothyroidism   . Obesity     Past Surgical History:  Procedure Laterality Date  . CATARACT EXTRACTION, BILATERAL    . CHOLECYSTECTOMY    . COLONOSCOPY  2010, 04/2013   Buccini  . IR GENERIC HISTORICAL  08/11/2016   IR FLUORO GUIDE PORT INSERTION RIGHT 08/11/2016 Greggory Keen, MD [facility]-INTERV RAD  . IR GENERIC HISTORICAL  08/11/2016   IR US GUIDE VASC ACCESS RIGHT 08/11/2016 Greggory Keen, MD [facility]-INTERV RAD  . PTERYGIUM EXCISION    . ROBOTIC ASSISTED TOTAL HYSTERECTOMY WITH BILATERAL SALPINGO OOPHERECTOMY N/A 05/06/2016   Procedure: XI ROBOTIC ASSISTED  LAPAROSCOPIC TOTAL HYSTERECTOMY WITH BILATERAL SALPINGO OOPHORECTOMY;  Surgeon: Nancy Marus, MD;  Location: [facility] ORS;  Service: Gynecology;  Laterality: N/A;  . SENTINEL NODE BIOPSY N/A 05/06/2016   Procedure: SENTINEL NODE BIOPSY;  Surgeon: Nancy Marus, MD;  Location: [facility] ORS;  Service: Gynecology;  Laterality: N/A;  . TUBAL LIGATION      Allergies: Levaquin [levofloxacin hemihydrate]; Promethazine hcl; and Sulfa drugs cross reactors  Medications: Prior to Admission medications   Medication Sig Start Date End Date Taking? Authorizing Provider  Alpha-Lipoic Acid 600 MG CAPS Take 1 capsule by mouth daily.   Yes [provider]  b complex vitamins capsule Take 1 capsule by mouth daily.   Yes [provider]  Cholecalciferol (VITAMIN D) 2000 UNITS tablet Take 2,000 Units by mouth daily.   Yes [provider]  DULoxetine (CYMBALTA) 30 MG capsule Take 1 capsule (30 mg total) by  mouth daily. 02/16/17  Yes Rita Ohara, MD  glucosamine-chondroitin 500-400 MG tablet Take 1 tablet by mouth 2 (two) times daily.    Yes [provider]  levothyroxine (SYNTHROID, LEVOTHROID) 88 MCG tablet TAKE ONE TABLET BY MOUTH ONCE DAILY BEFORE  BREAKFAST. 01/06/17  Yes Rita Ohara, MD  Magnesium 400 MG CAPS Take 1 capsule by mouth 2 (two) times daily.    Yes [provider]  MELATONIN PO Take 1 tablet by mouth at bedtime as needed.   Yes [provider]  Multiple Vitamin (MULTI-VITAMIN) tablet Take 1 tablet by mouth daily.     Yes [provider]  naproxen sodium (ANAPROX) 220 MG tablet Take 220 mg by mouth 2 (two) times daily as needed (pain).    Yes [provider]  polyethylene glycol (MIRALAX / GLYCOLAX) packet Take 17 g by mouth 2 (two) times daily as needed for mild constipation.    Yes [provider]  senna-docusate (SENOKOT S) 8.6-50 MG tablet Take 1 tablet by mouth 2 (two) times daily as needed for mild constipation.   Yes [provider]  terbinafine (LAMISIL) 250 MG tablet Take 1 tablet (250 mg total) by mouth daily. Patient taking differently: Take 250 mg by mouth daily. Using every other day 12/23/16  Yes Hyatt, Max T, DPM  Wheat Dextrin (BENEFIBER DRINK MIX PO) Take 1 scoop by mouth 2 (two) times daily.    Yes [provider]  lidocaine-prilocaine (EMLA) cream  08/08/16   [provider]     Family History  Problem Relation Age of Onset  . Cancer Mother 46       COLON   . Heart disease Mother   . Cancer Maternal Aunt   . Breast cancer Maternal Aunt   . Mental illness Maternal Uncle   . Cancer Maternal Grandmother        stomach  . Macular degeneration Maternal Grandmother   . Diabetes Neg Hx     Social History   Social History  . Marital status: Married    Spouse name: Jeneen Rinks  . Number of children: 1  . Years of education: MFA   Occupational History  . retired Librarian, academic); Artist    Social History Main Topics  . Smoking status: Former Smoker    Packs/day: 1.00    Types: Cigarettes    Quit date: 09/29/1980  . Smokeless tobacco: Never Used  . Alcohol use 0.0 oz/week     Comment: 1 glass of wine 3-4 times/week; hard liquor (highball) or a beer once a week  . Drug use: No  . Sexual activity: Not Asked   Other Topics Concern  . None   Social History  Narrative   Lives with husband.  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 Son lives in [place]; granddaughter lives in [place] (with her mother--son is divorced)   Review of Systems  Constitutional: Negative for fatigue and fever.  Respiratory: Negative for cough and shortness of breath.   Cardiovascular: Negative for chest pain.  Gastrointestinal: Positive for abdominal pain.  Psychiatric/Behavioral: Negative for behavioral problems and confusion.    Vital Signs: BP (!) 134/59 (BP Location: Left Arm)   Pulse 96   Temp 99.9 F (37.7 C) (Oral)   Resp 18   Ht 5\' 6"  (1.676 m)   Wt 193 lb 5.5 oz (87.7 kg)   SpO2 92%  BMI 31.21 kg/m   Physical Exam  Constitutional: She is oriented to person, place, and time. She appears well-developed.  Cardiovascular: Normal rate, regular rhythm and normal heart sounds.   Pulmonary/Chest: Effort normal and breath sounds normal. No respiratory distress.  Abdominal: Soft.  Neurological: She is alert and oriented to person, place, and time.  Skin: Skin is warm and dry.  Psychiatric: She has a normal mood and affect. Her behavior is normal. Judgment and thought content normal.  Nursing note and vitals reviewed.   Mallampati Score:  MD Evaluation Airway: WNL Heart: WNL Abdomen: WNL Chest/ Lungs: WNL ASA  Classification: 3 Mallampati/Airway Score: Two  Imaging: Dg Chest 2 View  Result Date: 02/17/2017 CLINICAL DATA:  Shortness of breath for a couple of days with some dizziness. Former smoker. EXAM: CHEST  2 VIEW COMPARISON:  Chest CTA 07/29/2016 FINDINGS: Right jugular Port-A-Cath terminates over the lower SVC there is a small hiatal hernia. The cardiac silhouette is normal in size. Aortic atherosclerosis is noted. There is anterior eventration of the right hemidiaphragm. The lungs are clear. No pleural effusion or pneumothorax is  identified. Thoracic spondylosis and chronic retrolisthesis of T12 on L1 are noted. IMPRESSION: No active cardiopulmonary disease. Electronically Signed   By: Logan Bores M.D.   On: 02/17/2017 15:21   Ct Abdomen Pelvis W Contrast  Result Date: 02/17/2017 CLINICAL DATA:  Right lower quadrant pain and weakness history of endometrial cancer EXAM: CT ABDOMEN AND PELVIS WITH CONTRAST TECHNIQUE: Multidetector CT imaging of the abdomen and pelvis was performed using the standard protocol following bolus administration of intravenous contrast. CONTRAST:  147mL ISOVUE-300 IOPAMIDOL (ISOVUE-300) INJECTION 61% COMPARISON:  11/19/2016, 09/23/2016 FINDINGS: Lower chest: Lung bases demonstrate no acute consolidation or pleural effusion. Heart size nonenlarged. Hepatobiliary: Stable subcentimeter hypodense lesions in the liver. Mild intra hepatic and extrahepatic biliary dilatation post cholecystectomy, also unchanged. Pancreas: Unremarkable. No pancreatic ductal dilatation or surrounding inflammatory changes. Spleen: Normal in size without focal abnormality. Adrenals/Urinary Tract: Adrenal glands are unremarkable. Kidneys show no hydronephrosis. Intermediate density lesion measuring 14 mm posterior cortex of the mid right kidney. Bladder is displaced to the left and appears slightly thick walled. Stomach/Bowel: Stomach nonenlarged. No dilated small bowel. Diffuse diverticular disease of the colon without acute inflammation. Vascular/Lymphatic: Aortic atherosclerosis. No significantly enlarged abdominal or pelvic lymph nodes. Reproductive: Status post hysterectomy. Other: No free air. No significant free fluid. Fat in the umbilicus. A previously stable thin walled cystic lesion in the right pelvis now demonstrates thick wall enhancement and has increased in size, now measuring 7.5 x 5.8 x 7.1 cm. There is edema and stranding within the fat surrounding the mass. Adjacent soft tissue thickening along the right pelvic sidewall.  Mass effect on the bladder which is also slightly thick walled. Musculoskeletal: Degenerative changes at multiple levels. No acute or suspicious bone lesion. IMPRESSION: 1. The previously noted thin walled cystic lesion in the right pelvis has undergone change in appearance ; it is now thick-walled and rim enhancing. There is associated inflammation and edema surrounding the pelvic sidewall lesion which has also increased in size. This suggests interim inflammation/infection of the cystic collection. 2. Urinary bladder displaced to the left by the thick-walled right pelvic sidewall cystic lesion. Wall thickening of the bladder is likely reactive but could also be due to a cystitis. Suggest correlation with urinalysis. 3. Stable intra and extrahepatic biliary dilatation post cholecystectomy 4. Stable 13 mm intermediate density right renal lesion 5. Diffuse diverticular disease of the colon without  acute inflammation Electronically Signed   By: Donavan Foil M.D.   On: 02/17/2017 18:08    Labs:  CBC:  Recent Labs  01/08/17 1149 02/16/17 1202 02/17/17 1527 02/18/17 0628  WBC 4.1 16.2* 16.4* 17.2*  HGB 11.9 11.9 11.0* 10.1*  HCT 36.0 36.0 32.2* 30.0*  PLT 235 529* 507* 477*    COAGS:  Recent Labs  08/11/16 1256 09/30/16 1021 02/18/17 0940  INR 0.94 1.00 1.26  APTT  --  25 35    BMP:  Recent Labs  07/29/16 1213  08/11/16 1256  09/25/16 1146 10/09/16 0935 02/17/17 1527 02/18/17 0628  NA 137  < > 139  < > 139 141 132* 134*  K 3.9  < > 4.1  < > 4.0 4.1 3.7 3.7  CL 104  --  106  --   --   --  95* 101  CO2 28  < > 23  < > 25 28 25 25   GLUCOSE 104*  < > 93  < > 99 73 106* 108*  BUN 19  < > 14  < > 12.9 16.0 10 9  CALCIUM 8.5*  < > 8.9  < > 8.8 9.0 8.1* 8.0*  CREATININE 0.79  < > 0.74  < > 0.8 0.8 0.82 0.80  GFRNONAA >60  --  >60  --   --   --  >60 >60  GFRAA >60  --  >60  --   --   --  >60 >60  < > = values in this interval not displayed.  LIVER FUNCTION TESTS:  Recent  Labs  09/18/16 0926 09/25/16 1146 10/09/16 0935 02/17/17 1527  BILITOT 0.23 0.29 0.24 0.5  AST 20 20 16 17   ALT 10 9 <6 10*  ALKPHOS 74 69 73 83  PROT 6.7 6.4 6.7 6.8  ALBUMIN 3.4* 3.4* 3.3* 2.5*    TUMOR MARKERS: No results for input(s): AFPTM, CEA, CA199, CHROMGRNA in the last 8760 hours.  Assessment and Plan: Pelvic fluid collection Patient with history of hysterectomy and BSO in 2017, now with recurrent pelvic fluid collection previously aspirated in January of this year..   Case reviewed by Dr. Pascal Lux.  Surgery and OBGYN consults obtained as well. At this time, aspiration and drainage warranted by medical team. Will send for cultures and cytology as requested.  Risks and benefits discussed with the patient including bleeding, infection, damage to adjacent structures, bowel perforation/fistula connection, and sepsis. All of the patient's questions were answered, patient is agreeable to proceed. Consent signed and in chart.  Thank you for this interesting consult.  I greatly enjoyed meeting ADDALYNNE GOLDING and look forward to participating in their care.  A copy of this report was sent to the requesting provider on this date.  Electronically Signed: Docia Barrier, PA 02/18/2017, 11:42 AM   I spent a total of 40 Minutes    in face to face in clinical consultation, greater than 50% of which was counseling/coordinating care for pelvic fluid collection.

## 2017-02-18 NOTE — Procedures (Signed)
Pre procedural Dx: Pelvic abscess Post procedural Dx: Same  Technically successful CT guided placed of a 10 Fr drainage catheter placement into the right lower abdomen/pelvis yielding 50 cc of purulent fluid.    All aspirated samples sent to the laboratory for culture and cytologic analysis.    EBL: None  Complications: None immediate  Ronny Bacon, MD Pager #: [phone]

## 2017-02-18 NOTE — Care Management Note (Signed)
Case Management Note  Patient Details  Name: Kayla Guerrero MRN: [id number] Date of Birth: 19-Apr-1935  Subjective/Objective: 82 y/o f admitted w/Sepsis. From home.Hx: Endometrial Ca. GYN/IR following-aspirate.PT cons-await recc.                   Action/Plan:d/c plan home.   Expected Discharge Date:   (unknown)               Expected Discharge Plan:  Home/Self Care  In-House Referral:     Discharge planning Services  CM Consult  Post Acute Care Choice:    Choice offered to:     DME Arranged:    DME Agency:     HH Arranged:    HH Agency:     Status of Service:  In process, will continue to follow  If discussed at Long Length of Stay Meetings, dates discussed:    Additional Comments:  Dessa Phi, RN 02/18/2017, 12:35 PM

## 2017-02-18 NOTE — Progress Notes (Signed)
MEDICATION-RELATED CONSULT NOTE   IR Procedure Consult - Anticoagulant/Antiplatelet PTA/Inpatient Med List Review by Pharmacist    Procedure: abdominal abscess - drainage catheter placement    Completed: 4p 5/23  Post-Procedural bleeding risk per IR MD assessment: standard  Antithrombotic medications on inpatient or PTA profile prior to procedure:  Lovenox 40 mg q24 hr    Recommended restart time per IR Post-Procedure Guidelines:  Day + 1 (following AM)   Other considerations:  none   Plan:     Resume Lovenox 40 mg q24 tomorrow AM  Reuel Boom, PharmD, BCPS Pager: [phone] 02/18/2017, 7:11 PM

## 2017-02-18 NOTE — Consult Note (Signed)
Called by hospitalist group due to right pelvic cyst with ring enhancement.   I reviewed the imaging and patient history including prior robotic hysterectomy with BSO in 2017. Additionally discussed with colleague Dr. Mora Bellman. Fluid collection is unlikely reproductive organ related as all reproductive organs were removed. I recommended the following: 1) Appropriate abx coverage for possible abscess which has already been done (on Vanc/Zosyn) 2) IR drainage of area with cultures and cytology.   Consulting MD agreed with plan and will call back if patient needs to be seen by GYN or with questions.   Caren Macadam, MD, MPH, ABFM Attending [place] for [facility]

## 2017-02-18 NOTE — Progress Notes (Signed)
PROGRESS NOTE    Kayla Guerrero  ZJQ:[id number] DOB: 11-19-1934 DOA: 02/17/2017 PCP: Rita Ohara, MD  Brief Narrative:  Kayla Guerrero is a 82 y.o. female with medical history significant of osteoarthritis, spinal stenosis, bilateral lower extremity neuropathy, endometrial cancer, depression, history of radiation therapy, cataracts, hyperlipidemia, colonic polyps, GERD, Hypothyroidism, obesity, osteopenia and other comorbids who is coming to the emergency department after oncology follow-up due to lower abdominal pain, diarrhea for 4 days associated with weakness, hypersomnolence following an episode of a gastroenteritis about a week ago after returning from the beach. She saw Dr. Kelby Aline the day before she presented to the emergency department due to above symptoms, orthostatic hypotension and leukocytosis.   Per patient, she has been having abdominal pain for about 4 days. She initially had dry heaving with an episode of water and bile emesis. She denies further emesis since then. She has had diarrhea alternating with constipation. She denies melena, hematochezia, dysuria or frequency, but states that she has been urinating very little volume compared to usual in the past 2 days. Other symptoms have been fever, dizziness, fatigue, dry cough, decreased appetite and hypersomnolence. She was told to decrease her Cymbalta to decrease somnolence. She denied chest pain, palpitations, dyspnea, PND, orthopnea or pitting edema of the lower extremities. CT Abd/Pelvis was done which revealed that the previously seen thinwall cystic lesions in the right pelvis is now take wall and ring-enhancing with associated inflammation and edema surrounding the pelvic sidewall lesions which also has increased in size. This suggests inter-inflammation/infection of the cystic collection. The urinary bladder is displaced to the left by these lesions. General Surgery, IR, and GYN were consulted for further recc's.  Assessment  & Plan:   Principal Problem:   Sepsis ([place]) Active Problems:   GERD (gastroesophageal reflux disease)   Chemotherapy-induced peripheral neuropathy ([facility])   Pelvic fluid collection   Anemia   Hyponatremia   Hypothyroidism  Sepsis ([place]) likely to Pelvic Fluid Collection/Abscess -Secondary to pelvic fluid collection. -Patient is s/p 2.5 Liter boluses -Continue IV fluid with NS + 20 mEQ of KCl at 75 mL/hr -Continue IV Vancomycin and Zosyn per pharmacy. -IR to take patient later this Afternoon to Aspirate and Drain as INR is 1.26 and patient is NPO -General Surgery Consulted and feel no role for General Surgery at this Point and recommended GYN Consult -Gyn Dr. Claudia Pollock and feel like fluid collection is unlikely reproductive organ related as reproductive organs were removed; Reccommended Abx coverage and IR grainage with Cultures and Cytology -WBC went from 16.4 -> 17.2 -Blood Cx not Done and of little value now that patient is on Broad Spectrum Abx -U/A was Negative -Lactic Acid Level was 0.70 -Pain Control with Fenatnyl 25 mcg IV q2hprn and IV Toradol was given once -Repeat CMP in AM  Pelvic fluid collection/Abscess -Continue IV antibiotics as Above -Will need Cx and Cytology -IR to take patient later this Afternoon to Aspirate and Drain -General Surgery Consulted and feel no role for General Surgery at this Point and recommended GYN Consult -Gyn Dr. Ernestina Patches Consulted and feel like fluid collection is unlikely reproductive organ related as reproductive organs were removed; Recommened Abx coverage and IR grainage with Cultures and Cytology  Iron Deficiency Anemia -Hb/Hct dropped from 11.9/36.0 -> 10.1/30.0; -Possible Dilutional Drop component  -Anemia Panel showed Iron level of 9, UIBC of 148, TIBC of 157, Saturation Ratios of 6, Ferritin of 181, Folate of 29.0, Vitamin B12 was 267 -Will likely need Iron  Supplementation after Infection settles down -Repeat CBC in  AM  Hyponatremia -Improving as Na+ went from 132 -> 134 -Continue normal saline infusion. -Repeat CMP in AM  Hypothyroidism -Continue levothyroxine 88 g by mouth daily. -TSH monitoring as an outpatient.   Thrombocytosis -Likely reactive from Infection -Improving and trending down from 529 -> 477 -Repeat CBC in AM  Severe Peripheral Neuropathy with Left Foot Drop -Chemotherapy Induced  -C/w Duloxetine 30 mg po Daily -Will get PT/OT Evaluation   GERD -C/w Famotidine 20 mg po BID  Hx of Endometrial Cancer s/p Robotic Hysterectomy with BSO and Chemotherapy -S/p Paclitaxel and Carboplatin -Gyn Consulted and feel fluid collection is unlikely reproductive organ related -Continue to Monitor -Outpatient follow up with Oncology  DVT prophylaxis: Lovenox 40 mg sq q24h (Held in anticipation of procedure) Code Status: FULL CODE Family Communication: Discussed with Husband at bedside Disposition Plan: Pending further workup- Likely Home in 2-3 Days  Consultants:   General Surgery Dr. Ninfa Linden  GYN Dr. Ernestina Patches  Interventional Radiology Dr. Pascal Lux   Procedures: IR Consulted for Aspiration and Drainage   Antimicrobials: Anti-infectives    Start     Dose/Rate Route Frequency Ordered Stop   02/18/17 0900  vancomycin (VANCOCIN) IVPB 1000 mg/200 mL premix     1,000 mg 200 mL/hr over 60 Minutes Intravenous Every 12 hours 02/17/17 1955     02/18/17 0400  piperacillin-tazobactam (ZOSYN) IVPB 3.375 g     3.375 g 12.5 mL/hr over 240 Minutes Intravenous Every 8 hours 02/17/17 1955     02/17/17 2000  piperacillin-tazobactam (ZOSYN) IVPB 3.375 g     3.375 g 100 mL/hr over 30 Minutes Intravenous To Emergency Dept 02/17/17 1947 02/17/17 2057   02/17/17 2000  vancomycin (VANCOCIN) IVPB 1000 mg/200 mL premix     1,000 mg 200 mL/hr over 60 Minutes Intravenous STAT 02/17/17 1948 02/17/17 2127     Subjective: Seen and examined and states she is in pain still but manageable with IV  Fentanyl. Has not had had any recurrent Nausea or vomiting. Denied CP or SOB.   Objective: Vitals:   02/18/17 0300 02/18/17 0510 02/18/17 1348 02/18/17 1400  BP: 138/65 (!) 134/59 (!) 151/74 (!) 147/69  Pulse: 99 96 99 99  Resp: 14 18 16 19   Temp: 99.2 F (37.3 C) 99.9 F (37.7 C)    TempSrc: Oral Oral    SpO2: 94% 92% 93% 92%  Weight:      Height:        Intake/Output Summary (Last 24 hours) at 02/18/17 1409 Last data filed at 02/18/17 1054  Gross per 24 hour  Intake             1785 ml  Output              925 ml  Net              860 ml   Filed Weights   02/17/17 2115  Weight: 87.7 kg (193 lb 5.5 oz)   Examination: Physical Exam:  Constitutional:  NAD and appears calm and comfortable Eyes: Lids and conjunctivae normal, sclerae anicteric  ENMT: External Ears, Nose appear normal. Grossly normal hearing.  Neck: Appears normal, supple, no cervical masses, normal ROM, no appreciable thyromegaly, no visible JVD Respiratory: Clear to auscultation bilaterally, no wheezing, rales, rhonchi or crackles. Normal respiratory effort and patient is not tachypenic. No accessory muscle use.  Chest Wall: Port-A-Cath on Right Chest Cardiovascular: RRR, no murmurs / rubs / gallops. S1  and S2 auscultated. No extremity edema.  Abdomen: Soft, tender to palpate in Lower quadrants, non-distended. No masses palpated. No appreciable hepatosplenomegaly. Bowel sounds positive x 4  GU: Deferred. Musculoskeletal: No clubbing / cyanosis of digits/nails. No joint deformity upper and lower extremities.  Skin: No rashes, lesions, ulcers on limited skin. No induration; Warm and dry.  Neurologic: CN 2-12 grossly intact with no focal deficits. Romberg sign cerebellar reflexes not assessed.  Psychiatric: Normal judgment and insight. Alert and oriented x 3. Normal mood and appropriate affect.   Data Reviewed: I have personally reviewed following labs and imaging studies  CBC:  Recent Labs Lab  02/16/17 1202 02/17/17 1527 02/18/17 0628  WBC 16.2* 16.4* 17.2*  NEUTROABS 14,256* 14.6* 15.7*  HGB 11.9 11.0* 10.1*  HCT 36.0 32.2* 30.0*  MCV 88.2 86.3 86.7  PLT 529* 507* 935*   Basic Metabolic Panel:  Recent Labs Lab 02/17/17 1527 02/18/17 0628  NA 132* 134*  K 3.7 3.7  CL 95* 101  CO2 25 25  GLUCOSE 106* 108*  BUN 10 9  CREATININE 0.82 0.80  CALCIUM 8.1* 8.0*   GFR: Estimated Creatinine Clearance: 62.6 mL/min (by C-G formula based on SCr of 0.8 mg/dL). Liver Function Tests:  Recent Labs Lab 02/17/17 1527  AST 17  ALT 10*  ALKPHOS 83  BILITOT 0.5  PROT 6.8  ALBUMIN 2.5*   No results for input(s): LIPASE, AMYLASE in the last 168 hours. No results for input(s): AMMONIA in the last 168 hours. Coagulation Profile:  Recent Labs Lab 02/18/17 0940  INR 1.26   Cardiac Enzymes: No results for input(s): CKTOTAL, CKMB, CKMBINDEX, TROPONINI in the last 168 hours. BNP (last 3 results) No results for input(s): PROBNP in the last 8760 hours. HbA1C: No results for input(s): HGBA1C in the last 72 hours. CBG: No results for input(s): GLUCAP in the last 168 hours. Lipid Profile: No results for input(s): CHOL, HDL, LDLCALC, TRIG, CHOLHDL, LDLDIRECT in the last 72 hours. Thyroid Function Tests: No results for input(s): TSH, T4TOTAL, FREET4, T3FREE, THYROIDAB in the last 72 hours. Anemia Panel:  Recent Labs  02/18/17 0628  VITAMINB12 267  FOLATE 29.0  FERRITIN 181  TIBC 157*  IRON 9*  RETICCTPCT 0.6   Sepsis Labs:  Recent Labs Lab 02/17/17 1531  LATICACIDVEN 0.70   No results found for this or any previous visit (from the past 240 hour(s)).   Radiology Studies: Dg Chest 2 View  Result Date: 02/17/2017 CLINICAL DATA:  Shortness of breath for a couple of days with some dizziness. Former smoker. EXAM: CHEST  2 VIEW COMPARISON:  Chest CTA 07/29/2016 FINDINGS: Right jugular Port-A-Cath terminates over the lower SVC there is a small hiatal hernia. The  cardiac silhouette is normal in size. Aortic atherosclerosis is noted. There is anterior eventration of the right hemidiaphragm. The lungs are clear. No pleural effusion or pneumothorax is identified. Thoracic spondylosis and chronic retrolisthesis of T12 on L1 are noted. IMPRESSION: No active cardiopulmonary disease. Electronically Signed   By: Logan Bores M.D.   On: 02/17/2017 15:21   Ct Abdomen Pelvis W Contrast  Result Date: 02/17/2017 CLINICAL DATA:  Right lower quadrant pain and weakness history of endometrial cancer EXAM: CT ABDOMEN AND PELVIS WITH CONTRAST TECHNIQUE: Multidetector CT imaging of the abdomen and pelvis was performed using the standard protocol following bolus administration of intravenous contrast. CONTRAST:  160mL ISOVUE-300 IOPAMIDOL (ISOVUE-300) INJECTION 61% COMPARISON:  11/19/2016, 09/23/2016 FINDINGS: Lower chest: Lung bases demonstrate no acute consolidation or pleural  effusion. Heart size nonenlarged. Hepatobiliary: Stable subcentimeter hypodense lesions in the liver. Mild intra hepatic and extrahepatic biliary dilatation post cholecystectomy, also unchanged. Pancreas: Unremarkable. No pancreatic ductal dilatation or surrounding inflammatory changes. Spleen: Normal in size without focal abnormality. Adrenals/Urinary Tract: Adrenal glands are unremarkable. Kidneys show no hydronephrosis. Intermediate density lesion measuring 14 mm posterior cortex of the mid right kidney. Bladder is displaced to the left and appears slightly thick walled. Stomach/Bowel: Stomach nonenlarged. No dilated small bowel. Diffuse diverticular disease of the colon without acute inflammation. Vascular/Lymphatic: Aortic atherosclerosis. No significantly enlarged abdominal or pelvic lymph nodes. Reproductive: Status post hysterectomy. Other: No free air. No significant free fluid. Fat in the umbilicus. A previously stable thin walled cystic lesion in the right pelvis now demonstrates thick wall enhancement and  has increased in size, now measuring 7.5 x 5.8 x 7.1 cm. There is edema and stranding within the fat surrounding the mass. Adjacent soft tissue thickening along the right pelvic sidewall. Mass effect on the bladder which is also slightly thick walled. Musculoskeletal: Degenerative changes at multiple levels. No acute or suspicious bone lesion. IMPRESSION: 1. The previously noted thin walled cystic lesion in the right pelvis has undergone change in appearance ; it is now thick-walled and rim enhancing. There is associated inflammation and edema surrounding the pelvic sidewall lesion which has also increased in size. This suggests interim inflammation/infection of the cystic collection. 2. Urinary bladder displaced to the left by the thick-walled right pelvic sidewall cystic lesion. Wall thickening of the bladder is likely reactive but could also be due to a cystitis. Suggest correlation with urinalysis. 3. Stable intra and extrahepatic biliary dilatation post cholecystectomy 4. Stable 13 mm intermediate density right renal lesion 5. Diffuse diverticular disease of the colon without acute inflammation Electronically Signed   By: Donavan Foil M.D.   On: 02/17/2017 18:08   Scheduled Meds: . cholecalciferol  2,000 Units Oral Daily  . DULoxetine  30 mg Oral Daily  . famotidine  20 mg Oral BID  . feeding supplement (ENSURE ENLIVE)  237 mL Oral BID BM  . fentaNYL      . levothyroxine  88 mcg Oral QAC breakfast  . magnesium oxide  400 mg Oral BID  . midazolam      . sodium chloride flush  10-40 mL Intracatheter Q12H   Continuous Infusions: . 0.9 % NaCl with KCl 20 mEq / L 75 mL/hr at 02/18/17 0508  . piperacillin-tazobactam (ZOSYN)  IV 3.375 g (02/18/17 1054)  . vancomycin Stopped (02/18/17 0903)    LOS: 1 day   Kerney Elbe, DO [facility] Pager [phone]  If 7PM-7AM, please contact night-coverage [email or web address] Password [email or web address] 02/18/2017, 2:09 PM

## 2017-02-19 ENCOUNTER — Encounter: Payer: Medicare Other | Admitting: Family Medicine

## 2017-02-19 ENCOUNTER — Inpatient Hospital Stay: Payer: Medicare Other | Attending: Family Medicine

## 2017-02-19 DIAGNOSIS — A408 Other streptococcal sepsis: Secondary | ICD-10-CM

## 2017-02-19 LAB — COMPREHENSIVE METABOLIC PANEL
ALBUMIN: 1.9 g/dL — AB (ref 3.5–5.0)
ALT: 9 U/L — ABNORMAL LOW (ref 14–54)
AST: 14 U/L — ABNORMAL LOW (ref 15–41)
Alkaline Phosphatase: 66 U/L (ref 38–126)
Anion gap: 7 (ref 5–15)
BILIRUBIN TOTAL: 0.3 mg/dL (ref 0.3–1.2)
BUN: 8 mg/dL (ref 6–20)
CALCIUM: 7.8 mg/dL — AB (ref 8.9–10.3)
CO2: 27 mmol/L (ref 22–32)
CREATININE: 0.75 mg/dL (ref 0.44–1.00)
Chloride: 102 mmol/L (ref 101–111)
GFR calc non Af Amer: >60 mL/min (ref >60)
GLUCOSE: 101 mg/dL — AB (ref 65–99)
Potassium: 3.8 mmol/L (ref 3.5–5.1)
Sodium: 136 mmol/L (ref 135–145)
TOTAL PROTEIN: 5.5 g/dL — AB (ref 6.5–8.1)

## 2017-02-19 LAB — CBC WITH DIFFERENTIAL/PLATELET
Basophils Absolute: 0 10*3/uL (ref 0.0–0.1)
Basophils Relative: 0 %
Eosinophils Absolute: 0.2 10*3/uL (ref 0.0–0.7)
Eosinophils Relative: 2 %
HEMATOCRIT: 27.6 % — AB (ref 36.0–46.0)
HEMOGLOBIN: 9.2 g/dL — AB (ref 12.0–15.0)
LYMPHS ABS: 1.1 10*3/uL (ref 0.7–4.0)
Lymphocytes Relative: 10 %
MCH: 29.2 pg (ref 26.0–34.0)
MCHC: 33.3 g/dL (ref 30.0–36.0)
MCV: 87.6 fL (ref 78.0–100.0)
MONOS PCT: 5 %
Monocytes Absolute: 0.5 10*3/uL (ref 0.1–1.0)
NEUTROS ABS: 8.4 10*3/uL — AB (ref 1.7–7.7)
NEUTROS PCT: 83 %
Platelets: 471 10*3/uL — ABNORMAL HIGH (ref 150–400)
RBC: 3.15 MIL/uL — ABNORMAL LOW (ref 3.87–5.11)
RDW: 14.1 % (ref 11.5–15.5)
WBC: 10.1 10*3/uL (ref 4.0–10.5)

## 2017-02-19 LAB — PHOSPHORUS: Phosphorus: 3.4 mg/dL (ref 2.5–4.6)

## 2017-02-19 LAB — MAGNESIUM: Magnesium: 1.8 mg/dL (ref 1.7–2.4)

## 2017-02-19 MED ORDER — FENTANYL CITRATE (PF) 100 MCG/2ML IJ SOLN: 12.5000 ug | INTRAMUSCULAR | Status: DC | PRN

## 2017-02-19 MED ORDER — HYDRALAZINE HCL 20 MG/ML IJ SOLN
5.0000 mg | Freq: Four times a day (QID) | INTRAMUSCULAR | Status: DC | PRN
  Administered 2017-02-19: 5 mg via INTRAVENOUS
  Filled 2017-02-19: qty 1

## 2017-02-19 MED ORDER — HYDROCODONE-ACETAMINOPHEN 5-325 MG PO TABS
1.0000 | ORAL_TABLET | Freq: Four times a day (QID) | ORAL | Status: DC | PRN
  Administered 2017-02-19 – 2017-02-20 (×3): 1 via ORAL
  Filled 2017-02-19 (×3): qty 1

## 2017-02-19 NOTE — Progress Notes (Signed)
Referring Physician(s): Manuel,D  Supervising Physician: Marybelle Killings  Patient Status:  [facility] - In-pt  Chief Complaint:  Right pelvic fluid collection  Subjective: Pt with less pelvic pressure since drain placed; still tender in region; no N/V   Allergies: Levaquin [levofloxacin hemihydrate]; Promethazine hcl; and Sulfa drugs cross reactors  Medications: Prior to Admission medications   Medication Sig Start Date End Date Taking? Authorizing Provider  Alpha-Lipoic Acid 600 MG CAPS Take 1 capsule by mouth daily.   Yes [provider]  b complex vitamins capsule Take 1 capsule by mouth daily.   Yes [provider]  Cholecalciferol (VITAMIN D) 2000 UNITS tablet Take 2,000 Units by mouth daily.   Yes [provider]  DULoxetine (CYMBALTA) 30 MG capsule Take 1 capsule (30 mg total) by mouth daily. 02/16/17  Yes Rita Ohara, MD  glucosamine-chondroitin 500-400 MG tablet Take 1 tablet by mouth 2 (two) times daily.    Yes [provider]  levothyroxine (SYNTHROID, LEVOTHROID) 88 MCG tablet TAKE ONE TABLET BY MOUTH ONCE DAILY BEFORE  BREAKFAST. 01/06/17  Yes Rita Ohara, MD  Magnesium 400 MG CAPS Take 1 capsule by mouth 2 (two) times daily.    Yes [provider]  MELATONIN PO Take 1 tablet by mouth at bedtime as needed (sleep).    Yes [provider]  Multiple Vitamin (MULTI-VITAMIN) tablet Take 1 tablet by mouth daily.     Yes [provider]  naproxen sodium (ANAPROX) 220 MG tablet Take 220 mg by mouth 2 (two) times daily as needed (pain).    Yes [provider]  polyethylene glycol (MIRALAX / GLYCOLAX) packet Take 17 g by mouth 2 (two) times daily as needed for mild constipation.    Yes [provider]  senna-docusate (SENOKOT S) 8.6-50 MG tablet Take 1 tablet by mouth 2 (two) times daily as needed for mild constipation.   Yes [provider]  terbinafine (LAMISIL) 250 MG tablet Take 1 tablet (250 mg  total) by mouth daily. Patient taking differently: Take 250 mg by mouth daily. Using every other day 12/23/16  Yes Hyatt, Max T, DPM  Wheat Dextrin (BENEFIBER DRINK MIX PO) Take 1 scoop by mouth 2 (two) times daily.    Yes [provider]  lidocaine-prilocaine (EMLA) cream  08/08/16   [provider]     Vital Signs: BP 136/66 (BP Location: Left Arm)   Pulse 91   Temp 99.1 F (37.3 C) (Oral)   Resp 20   Ht 5\' 6"  (1.676 m)   Wt 193 lb 5.5 oz (87.7 kg)   SpO2 94%   BMI 31.21 kg/m   Physical Exam right pelvic drain intact, insertion site mildly tender, dressing clean and dry, output 50 cc turbid, brownish-pink fluid  Imaging: Dg Chest 2 View  Result Date: 02/17/2017 CLINICAL DATA:  Shortness of breath for a couple of days with some dizziness. Former smoker. EXAM: CHEST  2 VIEW COMPARISON:  Chest CTA 07/29/2016 FINDINGS: Right jugular Port-A-Cath terminates over the lower SVC there is a small hiatal hernia. The cardiac silhouette is normal in size. Aortic atherosclerosis is noted. There is anterior eventration of the right hemidiaphragm. The lungs are clear. No pleural effusion or pneumothorax is identified. Thoracic spondylosis and chronic retrolisthesis of T12 on L1 are noted. IMPRESSION: No active cardiopulmonary disease. Electronically Signed   By: Logan Bores M.D.   On: 02/17/2017 15:21   Ct Abdomen Pelvis W Contrast  Result Date: 02/17/2017 CLINICAL DATA:  Right lower quadrant pain and weakness history of endometrial cancer EXAM: CT ABDOMEN AND PELVIS WITH CONTRAST TECHNIQUE: Multidetector CT imaging of the abdomen and pelvis was performed using the standard protocol following bolus administration of intravenous contrast. CONTRAST:  153mL ISOVUE-300 IOPAMIDOL (ISOVUE-300) INJECTION 61% COMPARISON:  11/19/2016, 09/23/2016 FINDINGS: Lower chest: Lung bases demonstrate no acute consolidation or pleural effusion. Heart size nonenlarged. Hepatobiliary: Stable subcentimeter  hypodense lesions in the liver. Mild intra hepatic and extrahepatic biliary dilatation post cholecystectomy, also unchanged. Pancreas: Unremarkable. No pancreatic ductal dilatation or surrounding inflammatory changes. Spleen: Normal in size without focal abnormality. Adrenals/Urinary Tract: Adrenal glands are unremarkable. Kidneys show no hydronephrosis. Intermediate density lesion measuring 14 mm posterior cortex of the mid right kidney. Bladder is displaced to the left and appears slightly thick walled. Stomach/Bowel: Stomach nonenlarged. No dilated small bowel. Diffuse diverticular disease of the colon without acute inflammation. Vascular/Lymphatic: Aortic atherosclerosis. No significantly enlarged abdominal or pelvic lymph nodes. Reproductive: Status post hysterectomy. Other: No free air. No significant free fluid. Fat in the umbilicus. A previously stable thin walled cystic lesion in the right pelvis now demonstrates thick wall enhancement and has increased in size, now measuring 7.5 x 5.8 x 7.1 cm. There is edema and stranding within the fat surrounding the mass. Adjacent soft tissue thickening along the right pelvic sidewall. Mass effect on the bladder which is also slightly thick walled. Musculoskeletal: Degenerative changes at multiple levels. No acute or suspicious bone lesion. IMPRESSION: 1. The previously noted thin walled cystic lesion in the right pelvis has undergone change in appearance ; it is now thick-walled and rim enhancing. There is associated inflammation and edema surrounding the pelvic sidewall lesion which has also increased in size. This suggests interim inflammation/infection of the cystic collection. 2. Urinary bladder displaced to the left by the thick-walled right pelvic sidewall cystic lesion. Wall thickening of the bladder is likely reactive but could also be due to a cystitis. Suggest correlation with urinalysis. 3. Stable intra and extrahepatic biliary dilatation post  cholecystectomy 4. Stable 13 mm intermediate density right renal lesion 5. Diffuse diverticular disease of the colon without acute inflammation Electronically Signed   By: Donavan Foil M.D.   On: 02/17/2017 18:08   Ct Image Guided Drainage By Percutaneous Catheter  Result Date: 02/18/2017 INDICATION: Remote history of gynecologic malignancy, post surgical resection with persistent right pelvic fluid collection. Patient underwent CT-guided aspiration of this fluid collection on 09/30/2016 with cytologic analysis negative for evidence of malignancy. Unfortunately, patient returned to the emergency department yesterday with abdominal pain with CT scan demonstrating findings for superimposed infection of this recurrent/residual right pelvis sidewall collection. Request made for CT-guided percutaneous drainage catheter placement for infection source control. EXAM: CT IMAGE GUIDED DRAINAGE BY PERCUTANEOUS CATHETER COMPARISON:  CT abdomen pelvis - 02/17/2017; 11/19/2016; 09/23/2016; 05/16/2016 ; CT-guided right pelvic fluid collection aspiration -09/30/2016 MEDICATIONS: The patient is currently admitted to the hospital and receiving intravenous antibiotics. The antibiotics were administered within an appropriate time frame prior to the initiation of the procedure. ANESTHESIA/SEDATION: Moderate (conscious) sedation was employed during this procedure. A total of Versed 2 mg and Fentanyl 100 mcg was administered intravenously. Moderate Sedation Time: 17 minutes. The patient's level of consciousness and vital signs were monitored continuously by radiology nursing throughout the procedure under my direct supervision. CONTRAST:  None COMPLICATIONS: None immediate. PROCEDURE: Informed written consent was obtained from the patient after a discussion of the risks, benefits and alternatives to treatment. The patient was placed supine on the  CT gantry and a pre procedural CT was performed re-demonstrating the known abscess/fluid  collection within the right hemipelvis with dominant component measuring approximately 5.8 x 7.8 cm (image 25, series 2). The procedure was planned. A timeout was performed prior to the initiation of the procedure. The skin overlying the ventral aspect of the right lower abdomen/ pelvis was prepped and draped in the usual sterile fashion. The overlying soft tissues were anesthetized with 1% lidocaine with epinephrine. Appropriate trajectory was planned with the use of a 22 gauge spinal needle. An 18 gauge trocar needle was advanced into the abscess/fluid collection and a short Amplatz super stiff wire was coiled within the collection. Appropriate positioning was confirmed with a limited CT scan. The tract was serially dilated allowing placement of a 10 [place] all-purpose drainage catheter. Appropriate positioning was confirmed with a limited postprocedural CT scan. Approximately 50 mL of purulent fluid was aspirated. The tube was connected to a JP bulb and sutured in place. A dressing was placed. The patient tolerated the procedure well without immediate post procedural complication. IMPRESSION: Successful CT guided placement of a 10 French all purpose drain catheter into the residual/recurrent right pelvic sidewall fluid collection with aspiration of 50 mL of purulent fluid. Samples were sent to the laboratory for both culture and cytologic analysis. Electronically Signed   By: Sandi Mariscal M.D.   On: 02/18/2017 16:44    Labs:  CBC:  Recent Labs  02/16/17 1202 02/17/17 1527 02/18/17 0628 02/19/17 0523  WBC 16.2* 16.4* 17.2* 10.1  HGB 11.9 11.0* 10.1* 9.2*  HCT 36.0 32.2* 30.0* 27.6*  PLT 529* 507* 477* 471*    COAGS:  Recent Labs  08/11/16 1256 09/30/16 1021 02/18/17 0940  INR 0.94 1.00 1.26  APTT  --  25 35    BMP:  Recent Labs  08/11/16 1256  10/09/16 0935 02/17/17 1527 02/18/17 0628 02/19/17 0523  NA 139  < > 141 132* 134* 136  K 4.1  < > 4.1 3.7 3.7 3.8  CL 106  --   --   95* 101 102  CO2 23  < > 28 25 25 27   GLUCOSE 93  < > 73 106* 108* 101*  BUN 14  < > 16.0 10 9 8   CALCIUM 8.9  < > 9.0 8.1* 8.0* 7.8*  CREATININE 0.74  < > 0.8 0.82 0.80 0.75  GFRNONAA >60  --   --  >60 >60 >60  GFRAA >60  --   --  >60 >60 >60  < > = values in this interval not displayed.  LIVER FUNCTION TESTS:  Recent Labs  09/25/16 1146 10/09/16 0935 02/17/17 1527 02/19/17 0523  BILITOT 0.29 0.24 0.5 0.3  AST 20 16 17  14*  ALT 9 <6 10* 9*  ALKPHOS 69 73 83 66  PROT 6.4 6.7 6.8 5.5*  ALBUMIN 3.4* 3.3* 2.5* 1.9*    Assessment and Plan: endom ca, s/p TAH/BSO 2017; now with recurrent rt pelvic fluid coll/?abscess; s/p rt pelvic drain 5/23; temp 99.1; WBC nl; hgb 9.2(10.1), creat nl; drain fluid cx/cyt pending; cont drain irrigation; recheck CT within 1 week of drain placement either as IP or OP (IR drain clinic); other plans as per primary team/CCS; OOB.   Electronically Signed: D. Rowe Robert, PA-C 02/19/2017, 11:19 AM   I spent a total of 15 minutes at the the patient's bedside AND on the patient's hospital floor or unit, greater than 50% of which was counseling/coordinating care for pelvic drain  Patient ID: Kayla Guerrero, female   DOB: 01/07/1935, 82 y.o.   MRN: [id number]

## 2017-02-19 NOTE — Care Management Note (Signed)
Case Management Note  Patient Details  Name: Kayla Guerrero MRN: [id number] Date of Birth: 17-Aug-1935  Subjective/Objective:PT-recc HHPT-Provided patient w/HHC provider list for choice-chose [facility]-rep Drew aware & following for d/c, & HHPT order,f30f.                    Action/Plan:d/c home w/HHC.   Expected Discharge Date:   (unknown)               Expected Discharge Plan:  [place]  In-House Referral:     Discharge planning Services  CM Consult  Post Acute Care Choice:    Choice offered to:  Patient  DME Arranged:    DME Agency:     HH Arranged:    [place]:  [place]  Status of Service:  In process, will continue to follow  If discussed at Long Length of Stay Meetings, dates discussed:    Additional Comments:  Dessa Phi, RN 02/19/2017, 2:29 PM

## 2017-02-19 NOTE — Progress Notes (Signed)
PROGRESS NOTE    Kayla Guerrero  TGY:[id number] DOB: 04/12/1935 DOA: 02/17/2017 PCP: Rita Ohara, MD  Brief Narrative:  Kayla Guerrero is a 82 y.o. female with medical history significant of osteoarthritis, spinal stenosis, bilateral lower extremity neuropathy, endometrial cancer, depression, history of radiation therapy, cataracts, hyperlipidemia, colonic polyps, GERD, Hypothyroidism, obesity, osteopenia and other comorbids who is coming to the emergency department after oncology follow-up due to lower abdominal pain, diarrhea for 4 days associated with weakness, hypersomnolence following an episode of a gastroenteritis about a week ago after returning from the beach. She saw Dr. Kelby Aline the day before she presented to the emergency department due to above symptoms, orthostatic hypotension and leukocytosis.   Per patient, she has been having abdominal pain for about 4 days. She initially had dry heaving with an episode of water and bile emesis. She denies further emesis since then. She has had diarrhea alternating with constipation. She denies melena, hematochezia, dysuria or frequency, but states that she has been urinating very little volume compared to usual in the past 2 days. Other symptoms have been fever, dizziness, fatigue, dry cough, decreased appetite and hypersomnolence.   She was told to decrease her Cymbalta to decrease somnolence. She denied chest pain, palpitations, dyspnea, PND, orthopnea or pitting edema of the lower extremities. CT Abd/Pelvis was done which revealed that the previously seen thinwall cystic lesions in the right pelvis is now take wall and ring-enhancing with associated inflammation and edema surrounding the pelvic sidewall lesions which also has increased in size. This suggests inter-inflammation/infection of the cystic collection. The urinary bladder is displaced to the left by these lesions. General Surgery, IR, and GYN were consulted for further recc's.She  underwent IR Drain Right Pelvic Drain placement on 5/23 and is improving. WBC trending down however still has some abdominal Pain. Drain Fluid Cx grew out some Streptococcus Viridans and Cytology is pending.   Assessment & Plan:   Principal Problem:   Sepsis ([place]) Active Problems:   GERD (gastroesophageal reflux disease)   Chemotherapy-induced peripheral neuropathy (HCC)   Pelvic fluid collection   Anemia   Hyponatremia   Hypothyroidism  Sepsis ([place]) likely to Pelvic Fluid Collection/Abscess s/p JP Drain placement -Sepsis Physiology is improving but patient still having some abdominal pain -Secondary to pelvic fluid collection. -Patient is s/p 2.5 Liter boluses -D/C'd IV fluid with NS + 20 mEQ of KCl at 75 mL/hr -Discontinued IV Vancomycin; C/w UV Zosyn per pharmacy. -Patient is s/p Right Pelvic Drain Placement by IR on 02/19/17 -Drain fluid Cx Gram stain showed Moderate WBC Present, predominantly PMN and grew out few Gram Positive Cocci in Pairs; C/x grew out Few Viridans Streptococcus  -IR recommends continuing drain irrigation and repeating CT scan within 1 week of drain placement and following up as Inpatient or Outpatient at [facility] and feel no role for General Surgery at this Point and recommended GYN Consult -Gyn Dr. Ernestina Patches Consulted and feel like fluid collection is unlikely reproductive organ related as reproductive organs were removed; Reccommended Abx coverage and IR drainage with Cultures and Cytology -WBC went from 16.4 -> 17.2 -> 10.1 -Blood Cx not Done and of little value now that patient is on Broad Spectrum Abx; Will narrow Antibiotic Therapy and discussed case with Dr. Tommy Medal and he recommended narrowing to Augmentin prior to D/C and treating for at least a month.  -U/A was Negative -Lactic Acid Level was 0.70 -Pain Control with Fenatnyl 25 mcg IV q2hprn  and IV Toradol was given once -Repeat CMP in AM -PT recommended no PT Follow up  vs. Home Health PT  Pelvic fluid collection/Abscess -Continue IV antibiotics as Above -Cx and Cytology sent off; Cx grew Few Viridans Streptococcus  Patient is s/p Right Pelvic Drain Placement by IR on 02/19/17 -Drain fluid Cx Gram stain showed Moderate WBC Present, predominantly PMN and grew out few Gram Positive Cocci in Pairs; C/x grew out Few Viridans Streptococcus  -General Surgery Consulted and feel no role for General Surgery at this Point and recommended GYN Consult -Gyn Dr. Ernestina Patches Consulted and feel like fluid collection is unlikely reproductive organ related as reproductive organs were removed; Recommened Abx coverage and IR grainage with Cultures and Cytology -Discussed with ID Dr. Dietrich [place] and he recommended narrowing therapy to po Augmentin prior to D/C and treating for at least a month. Patient will need to follow with IR [facility] and outpatient GYN.   Iron Deficiency Anemia -Hb/Hct dropped from 11.9/36.0 -> 10.1/30.0 -> 9.2/27.6; -Possible Dilutional Drop component; Will now stop IVF -Anemia Panel showed Iron level of 9, UIBC of 148, TIBC of 157, Saturation Ratios of 6, Ferritin of 181, Folate of 29.0, Vitamin B12 was 267 -Will likely need Iron Supplementation after Infection settles down -Repeat CBC in AM  Hyponatremia, improved -Improving as Na+ went from 132 -> 134 -> 136 -Continue normal saline infusion. -Repeat CMP in AM  Hypothyroidism -Continue levothyroxine 88 g by mouth daily. -TSH monitoring as an outpatient.   Thrombocytosis -Likely reactive from Infection -Improving and trending down from 529 -> 477 -> 471 -Repeat CBC in AM  Severe Peripheral Neuropathy with Left Foot Drop -Chemotherapy Induced  -C/w Duloxetine 30 mg po Daily -PT recommends no PT follow up vs. Home Health PT (pending clinical course)  GERD -C/w Famotidine 20 mg po BID  Hx of Endometrial Cancer s/p Robotic Hysterectomy with BSO and Chemotherapy -S/p Paclitaxel and  Carboplatin -Gyn Consulted and feel fluid collection is unlikely reproductive organ related -Continue to Monitor -Outpatient follow up with Oncology and Outpatient GYN  DVT prophylaxis: Lovenox 40 mg sq q24h  Code Status: FULL CODE Family Communication: No family present at bedside Disposition Plan: Pending further workup- Likely Home in 2-3 Days  Consultants:   General Surgery Dr. Ninfa Linden  GYN Dr. Ernestina Patches  Interventional Radiology Dr. Pascal Lux  Discussed Case with Infectious Diseases Dr. Tommy Medal   Procedures: IR  Aspiration and Drainage with placement of JP Drain 02/18/17   Antimicrobials: Anti-infectives    Start     Dose/Rate Route Frequency Ordered Stop   02/18/17 0900  vancomycin (VANCOCIN) IVPB 1000 mg/200 mL premix  Status:  Discontinued     1,000 mg 200 mL/hr over 60 Minutes Intravenous Every 12 hours 02/17/17 1955 02/19/17 0945   02/18/17 0400  piperacillin-tazobactam (ZOSYN) IVPB 3.375 g     3.375 g 12.5 mL/hr over 240 Minutes Intravenous Every 8 hours 02/17/17 1955     02/17/17 2000  piperacillin-tazobactam (ZOSYN) IVPB 3.375 g     3.375 g 100 mL/hr over 30 Minutes Intravenous To Emergency Dept 02/17/17 1947 02/17/17 2057   02/17/17 2000  vancomycin (VANCOCIN) IVPB 1000 mg/200 mL premix     1,000 mg 200 mL/hr over 60 Minutes Intravenous STAT 02/17/17 1948 02/17/17 2127     Subjective: Seen and examined and states she feels a little better but Abdominal Pain is still there. No nausea or vomiting. Drain in place with purulent fluid. No CP or SOB.  Objective: Vitals:   02/18/17 1518 02/18/17 2054 02/19/17 0635 02/19/17 1335  BP: (!) 134/56 (!) 148/79 136/66 (!) 169/89  Pulse: 99 96 91 87  Resp: 18 18 20 16   Temp: 98.3 F (36.8 C) 99.5 F (37.5 C) 99.1 F (37.3 C) 98.6 F (37 C)  TempSrc: Oral Oral Oral Oral  SpO2: 92% 95% 94% 97%  Weight:      Height:        Intake/Output Summary (Last 24 hours) at 02/19/17 1502 Last data filed at 02/19/17 1223   Gross per 24 hour  Intake          2196.25 ml  Output              905 ml  Net          1291.25 ml   Filed Weights   02/17/17 2115  Weight: 87.7 kg (193 lb 5.5 oz)   Examination: Physical Exam:  Constitutional:  Patient is an 82 yo Caucasian female who is in NAD and appears calm and comfortable sitting in chair bedside Eyes: Sclerae anicteric, Lids appear normal ENMT: Grossly normal hearing. Mucous Membranes appear moist. External ears and nose appear normal Neck: Supple with no JVD or visible thyromegaly Respiratory: CTAB widh no wheezing/rales/rhonchi. Patient was not tachypenic or using accessory muscles to breathe Chest Wall: Has Port-A-Cath in Right chest Cardiovascular: RRR S1, S2. No m/r/g appreciated. No lower extremty edema Abdomen: Soft, mildly tender to palpate. Non-distended. JP drain in place in lower Right Abdomen. Bowel Sounds present GU: Deferred. Musculoskeletal: No Cyanosis or Contractures. Good ROM. Skin: Warm and dry with no rashes or lesions on limited skin evaluation Neurologic:  CN 2-12 grossly intact with no focal deficits. Romberg Sign not assessed Psychiatric: Awake and Alert. Intact judgement and insight. Has a pleasant mood and affect.   Data Reviewed: I have personally reviewed following labs and imaging studies  CBC:  Recent Labs Lab 02/16/17 1202 02/17/17 1527 02/18/17 0628 02/19/17 0523  WBC 16.2* 16.4* 17.2* 10.1  NEUTROABS 14,256* 14.6* 15.7* 8.4*  HGB 11.9 11.0* 10.1* 9.2*  HCT 36.0 32.2* 30.0* 27.6*  MCV 88.2 86.3 86.7 87.6  PLT 529* 507* 477* 993*   Basic Metabolic Panel:  Recent Labs Lab 02/17/17 1527 02/18/17 0628 02/19/17 0523  NA 132* 134* 136  K 3.7 3.7 3.8  CL 95* 101 102  CO2 25 25 27   GLUCOSE 106* 108* 101*  BUN 10 9 8   CREATININE 0.82 0.80 0.75  CALCIUM 8.1* 8.0* 7.8*  MG  --   --  1.8  PHOS  --   --  3.4   GFR: Estimated Creatinine Clearance: 62.6 mL/min (by C-G formula based on SCr of 0.75 mg/dL). Liver  Function Tests:  Recent Labs Lab 02/17/17 1527 02/19/17 0523  AST 17 14*  ALT 10* 9*  ALKPHOS 83 66  BILITOT 0.5 0.3  PROT 6.8 5.5*  ALBUMIN 2.5* 1.9*   No results for input(s): LIPASE, AMYLASE in the last 168 hours. No results for input(s): AMMONIA in the last 168 hours. Coagulation Profile:  Recent Labs Lab 02/18/17 0940  INR 1.26   Cardiac Enzymes: No results for input(s): CKTOTAL, CKMB, CKMBINDEX, TROPONINI in the last 168 hours. BNP (last 3 results) No results for input(s): PROBNP in the last 8760 hours. HbA1C: No results for input(s): HGBA1C in the last 72 hours. CBG: No results for input(s): GLUCAP in the last 168 hours. Lipid Profile: No results for input(s): CHOL, HDL, LDLCALC, TRIG,  CHOLHDL, LDLDIRECT in the last 72 hours. Thyroid Function Tests: No results for input(s): TSH, T4TOTAL, FREET4, T3FREE, THYROIDAB in the last 72 hours. Anemia Panel:  Recent Labs  02/18/17 0628  VITAMINB12 267  FOLATE 29.0  FERRITIN 181  TIBC 157*  IRON 9*  RETICCTPCT 0.6   Sepsis Labs:  Recent Labs Lab 02/17/17 1531  LATICACIDVEN 0.70   Recent Results (from the past 240 hour(s))  Aerobic/Anaerobic Culture (surgical/deep wound)     Status: None (Preliminary result)   Collection Time: 02/18/17  4:00 PM  Result Value Ref Range Status   Specimen Description ABSCESS PELVIS  Final   Special Requests Normal  Final   Gram Stain   Final    MODERATE WBC PRESENT, PREDOMINANTLY PMN FEW GRAM POSITIVE COCCI IN PAIRS Performed at [facility], [place] [address], [place], [place] [address]    Culture FEW VIRIDANS STREPTOCOCCUS  Final   Report Status PENDING  Incomplete     Radiology Studies: Dg Chest 2 View  Result Date: 02/17/2017 CLINICAL DATA:  Shortness of breath for a couple of days with some dizziness. Former smoker. EXAM: CHEST  2 VIEW COMPARISON:  Chest CTA 07/29/2016 FINDINGS: Right jugular Port-A-Cath terminates over the lower SVC there is a small hiatal  hernia. The cardiac silhouette is normal in size. Aortic atherosclerosis is noted. There is anterior eventration of the right hemidiaphragm. The lungs are clear. No pleural effusion or pneumothorax is identified. Thoracic spondylosis and chronic retrolisthesis of T12 on L1 are noted. IMPRESSION: No active cardiopulmonary disease. Electronically Signed   By: Logan Bores M.D.   On: 02/17/2017 15:21   Ct Abdomen Pelvis W Contrast  Result Date: 02/17/2017 CLINICAL DATA:  Right lower quadrant pain and weakness history of endometrial cancer EXAM: CT ABDOMEN AND PELVIS WITH CONTRAST TECHNIQUE: Multidetector CT imaging of the abdomen and pelvis was performed using the standard protocol following bolus administration of intravenous contrast. CONTRAST:  115mL ISOVUE-300 IOPAMIDOL (ISOVUE-300) INJECTION 61% COMPARISON:  11/19/2016, 09/23/2016 FINDINGS: Lower chest: Lung bases demonstrate no acute consolidation or pleural effusion. Heart size nonenlarged. Hepatobiliary: Stable subcentimeter hypodense lesions in the liver. Mild intra hepatic and extrahepatic biliary dilatation post cholecystectomy, also unchanged. Pancreas: Unremarkable. No pancreatic ductal dilatation or surrounding inflammatory changes. Spleen: Normal in size without focal abnormality. Adrenals/Urinary Tract: Adrenal glands are unremarkable. Kidneys show no hydronephrosis. Intermediate density lesion measuring 14 mm posterior cortex of the mid right kidney. Bladder is displaced to the left and appears slightly thick walled. Stomach/Bowel: Stomach nonenlarged. No dilated small bowel. Diffuse diverticular disease of the colon without acute inflammation. Vascular/Lymphatic: Aortic atherosclerosis. No significantly enlarged abdominal or pelvic lymph nodes. Reproductive: Status post hysterectomy. Other: No free air. No significant free fluid. Fat in the umbilicus. A previously stable thin walled cystic lesion in the right pelvis now demonstrates thick wall  enhancement and has increased in size, now measuring 7.5 x 5.8 x 7.1 cm. There is edema and stranding within the fat surrounding the mass. Adjacent soft tissue thickening along the right pelvic sidewall. Mass effect on the bladder which is also slightly thick walled. Musculoskeletal: Degenerative changes at multiple levels. No acute or suspicious bone lesion. IMPRESSION: 1. The previously noted thin walled cystic lesion in the right pelvis has undergone change in appearance ; it is now thick-walled and rim enhancing. There is associated inflammation and edema surrounding the pelvic sidewall lesion which has also increased in size. This suggests interim inflammation/infection of the cystic collection. 2. Urinary bladder displaced  to the left by the thick-walled right pelvic sidewall cystic lesion. Wall thickening of the bladder is likely reactive but could also be due to a cystitis. Suggest correlation with urinalysis. 3. Stable intra and extrahepatic biliary dilatation post cholecystectomy 4. Stable 13 mm intermediate density right renal lesion 5. Diffuse diverticular disease of the colon without acute inflammation Electronically Signed   By: Donavan Foil M.D.   On: 02/17/2017 18:08   Ct Image Guided Drainage By Percutaneous Catheter  Result Date: 02/18/2017 INDICATION: Remote history of gynecologic malignancy, post surgical resection with persistent right pelvic fluid collection. Patient underwent CT-guided aspiration of this fluid collection on 09/30/2016 with cytologic analysis negative for evidence of malignancy. Unfortunately, patient returned to the emergency department yesterday with abdominal pain with CT scan demonstrating findings for superimposed infection of this recurrent/residual right pelvis sidewall collection. Request made for CT-guided percutaneous drainage catheter placement for infection source control. EXAM: CT IMAGE GUIDED DRAINAGE BY PERCUTANEOUS CATHETER COMPARISON:  CT abdomen pelvis -  02/17/2017; 11/19/2016; 09/23/2016; 05/16/2016 ; CT-guided right pelvic fluid collection aspiration -09/30/2016 MEDICATIONS: The patient is currently admitted to the hospital and receiving intravenous antibiotics. The antibiotics were administered within an appropriate time frame prior to the initiation of the procedure. ANESTHESIA/SEDATION: Moderate (conscious) sedation was employed during this procedure. A total of Versed 2 mg and Fentanyl 100 mcg was administered intravenously. Moderate Sedation Time: 17 minutes. The patient's level of consciousness and vital signs were monitored continuously by radiology nursing throughout the procedure under my direct supervision. CONTRAST:  None COMPLICATIONS: None immediate. PROCEDURE: Informed written consent was obtained from the patient after a discussion of the risks, benefits and alternatives to treatment. The patient was placed supine on the CT gantry and a pre procedural CT was performed re-demonstrating the known abscess/fluid collection within the right hemipelvis with dominant component measuring approximately 5.8 x 7.8 cm (image 25, series 2). The procedure was planned. A timeout was performed prior to the initiation of the procedure. The skin overlying the ventral aspect of the right lower abdomen/ pelvis was prepped and draped in the usual sterile fashion. The overlying soft tissues were anesthetized with 1% lidocaine with epinephrine. Appropriate trajectory was planned with the use of a 22 gauge spinal needle. An 18 gauge trocar needle was advanced into the abscess/fluid collection and a short Amplatz super stiff wire was coiled within the collection. Appropriate positioning was confirmed with a limited CT scan. The tract was serially dilated allowing placement of a 10 [place] all-purpose drainage catheter. Appropriate positioning was confirmed with a limited postprocedural CT scan. Approximately 50 mL of purulent fluid was aspirated. The tube was connected to a  JP bulb and sutured in place. A dressing was placed. The patient tolerated the procedure well without immediate post procedural complication. IMPRESSION: Successful CT guided placement of a 10 French all purpose drain catheter into the residual/recurrent right pelvic sidewall fluid collection with aspiration of 50 mL of purulent fluid. Samples were sent to the laboratory for both culture and cytologic analysis. Electronically Signed   By: Sandi Mariscal M.D.   On: 02/18/2017 16:44   Scheduled Meds: . cholecalciferol  2,000 Units Oral Daily  . DULoxetine  30 mg Oral Daily  . enoxaparin (LOVENOX) injection  40 mg Subcutaneous Q24H  . famotidine  20 mg Oral BID  . levothyroxine  88 mcg Oral QAC breakfast  . magnesium oxide  400 mg Oral BID  . sodium chloride flush  10-40 mL Intracatheter Q12H   Continuous  Infusions: . 0.9 % NaCl with KCl 20 mEq / L 75 mL/hr at 02/19/17 0646  . piperacillin-tazobactam (ZOSYN)  IV 3.375 g (02/19/17 1220)    LOS: 2 days   Kerney Elbe, DO [facility] Pager [phone]  If 7PM-7AM, please contact night-coverage [email or web address] Password [email or web address] 02/19/2017, 3:02 PM

## 2017-02-19 NOTE — Evaluation (Addendum)
Physical Therapy Evaluation Patient Details Name: Kayla Guerrero MRN: [id number] DOB: May 18, 1935 Today's Date: 02/19/2017   History of Present Illness  82 yo female admitted with sepsis, pelvic fluid collection-s/p drain 5/23. Hx of OA, spinal stenosis, osteopenia, neuropathy, L foot drop, endometrial cancer, colon cancer    Clinical Impression  On eval, pt was Min guard assist for mobility. He walked ~150 feet in hallway with/without IV pole. Pt tolerated distance fairly well. Noted some mild dyspnea-O2 sat 94% on RA. Pt is generally weak. Recommend daily ambulation in hallway with nursing/family supervision. Will follow.     Follow Up Recommendations No PT follow up vs Home Health PT (depending on progress-may not need at d/c);Supervision for mobility/OOB   Equipment Recommendations  None recommended by PT    Recommendations for Other Services       Precautions / Restrictions Precautions Precautions: Fall Precaution Comments: R abdomen jp drain Restrictions Weight Bearing Restrictions: No      Mobility  Bed Mobility Overal bed mobility: Needs Assistance Bed Mobility: Supine to Sit     Supine to sit: Supervision     General bed mobility comments: for safety  Transfers Overall transfer level: Needs assistance   Transfers: Sit to/from Stand Sit to Stand: Supervision         General transfer comment: for safety  Ambulation/Gait Ambulation/Gait assistance: Min guard Ambulation Distance (Feet): 150 Feet Assistive device: None; IV pole Gait Pattern/deviations: Decreased dorsiflexion - left     General Gait Details: close guard for safety.   Stairs            Wheelchair Mobility    Modified Rankin (Stroke Patients Only)       Balance                                             Pertinent Vitals/Pain Pain Assessment: Faces Faces Pain Scale: Hurts little more Pain Location: abdomen Pain Descriptors / Indicators: Sore Pain  Intervention(s): Monitored during session    Home Living Family/patient expects to be discharged to:: Private residence Living Arrangements: Spouse/significant other   Type of Home: House Home Access: Stairs to enter   Technical brewer of Steps: 2 Home Layout: One level Home Equipment: None      Prior Function Level of Independence: Independent               Hand Dominance        Extremity/Trunk Assessment   Upper Extremity Assessment Upper Extremity Assessment: Overall WFL for tasks assessed    Lower Extremity Assessment Lower Extremity Assessment: Generalized weakness;LLE deficits/detail LLE Deficits / Details: drop foot    Cervical / Trunk Assessment Cervical / Trunk Assessment: Normal  Communication   Communication: No difficulties  Cognition Arousal/Alertness: Awake/alert Behavior During Therapy: WFL for tasks assessed/performed Overall Cognitive Status: Within Functional Limits for tasks assessed                                        General Comments      Exercises     Assessment/Plan    PT Assessment Patient needs continued PT services  PT Problem List Decreased strength;Decreased mobility;Decreased activity tolerance;Pain       PT Treatment Interventions Gait training;Therapeutic activities;Therapeutic exercise;Patient/family education;Functional mobility training  PT Goals (Current goals can be found in the Care Plan section)  Acute Rehab PT Goals Patient Stated Goal: less pain PT Goal Formulation: With patient Time For Goal Achievement: 03/05/17 Potential to Achieve Goals: Good    Frequency Min 3X/week   Barriers to discharge        Co-evaluation               AM-PAC PT "6 Clicks" Daily Activity  Outcome Measure Difficulty turning over in bed (including adjusting bedclothes, sheets and blankets)?: None Difficulty moving from lying on back to sitting on the side of the bed? : None Difficulty  sitting down on and standing up from a chair with arms (e.g., wheelchair, bedside commode, etc,.)?: A Little Help needed moving to and from a bed to chair (including a wheelchair)?: A Little Help needed walking in hospital room?: A Little Help needed climbing 3-5 steps with a railing? : A Little 6 Click Score: 20    End of Session   Activity Tolerance: Patient limited by fatigue;Patient limited by pain Patient left: in chair;with call bell/phone within reach   PT Visit Diagnosis: Muscle weakness (generalized) (M62.81);Difficulty in walking, not elsewhere classified (R26.2)    Time: 0086-7619 PT Time Calculation (min) (ACUTE ONLY): 14 min   Charges:   PT Evaluation $PT Eval Low Complexity: 1 Procedure     PT G Codes:          Weston Anna, MPT Pager: [phone]

## 2017-02-19 NOTE — Progress Notes (Signed)
Nutrition Brief Note  Patient identified on the Malnutrition Screening Tool (MST) Report  Wt Readings from Last 15 Encounters:  02/17/17 193 lb 5.5 oz (87.7 kg)  02/16/17 186 lb 12.8 oz (84.7 kg)  02/06/17 190 lb 3.2 oz (86.3 kg)  01/08/17 188 lb 4.8 oz (85.4 kg)  12/11/16 184 lb 12.8 oz (83.8 kg)  11/26/16 185 lb 12.8 oz (84.3 kg)  10/29/16 182 lb 3.2 oz (82.6 kg)  10/09/16 178 lb 3.2 oz (80.8 kg)  10/09/16 178 lb 8 oz (81 kg)  09/30/16 175 lb (79.4 kg)  09/25/16 180 lb 3.2 oz (81.7 kg)  09/18/16 181 lb 1.6 oz (82.1 kg)  09/11/16 181 lb 4.8 oz (82.2 kg)  09/01/16 176 lb (79.8 kg)  08/25/16 177 lb 6.4 oz (80.5 kg)    Body mass index is 31.21 kg/m. Patient meets criteria for obesity based on current BMI. Weight has slowly been increasing since January. Skin WDL. Pt admitted for abdominal pain with diarrhea x4 days and was diagnosed with sepsis on admission. Pt with hx of endometrial cancer s/p hysterectomy with BSO and chemo.  Current diet order is Heart Healthy and pt consumed 75% of dinner last night (510 kcal and 22 grams of protein). She ordered breakfast which was pancakes, pork sausage, banana, orange juice, coffee with creamer and sugar and provided 615 kcal and 10 grams of protein. Labs and medications reviewed. IVF: NS-20 mEq KCl @ 75 mL/hr.   Ensure Enlive was ordered BID on admission per ONS protocol but pt has been refusing each dose and prefers not to receive supplements. No nutrition interventions warranted at this time. If nutrition issues arise, please consult RD.    Jarome Matin, MS, RD, LDN, [facility] Clinical Dietitian Pager # [phone] After hours/weekend pager # [phone]

## 2017-02-20 ENCOUNTER — Other Ambulatory Visit: Payer: Self-pay | Admitting: Radiology

## 2017-02-20 DIAGNOSIS — A491 Streptococcal infection, unspecified site: Secondary | ICD-10-CM

## 2017-02-20 DIAGNOSIS — N739 Female pelvic inflammatory disease, unspecified: Secondary | ICD-10-CM

## 2017-02-20 LAB — CBC WITH DIFFERENTIAL/PLATELET
BASOS PCT: 0 %
Basophils Absolute: 0 10*3/uL (ref 0.0–0.1)
EOS ABS: 0.2 10*3/uL (ref 0.0–0.7)
Eosinophils Relative: 3 %
HCT: 28 % — ABNORMAL LOW (ref 36.0–46.0)
HEMOGLOBIN: 9.5 g/dL — AB (ref 12.0–15.0)
Lymphocytes Relative: 14 %
Lymphs Abs: 1.1 10*3/uL (ref 0.7–4.0)
MCH: 29.5 pg (ref 26.0–34.0)
MCHC: 33.9 g/dL (ref 30.0–36.0)
MCV: 87 fL (ref 78.0–100.0)
Monocytes Absolute: 0.7 10*3/uL (ref 0.1–1.0)
Monocytes Relative: 9 %
NEUTROS PCT: 74 %
Neutro Abs: 5.6 10*3/uL (ref 1.7–7.7)
Platelets: 550 10*3/uL — ABNORMAL HIGH (ref 150–400)
RBC: 3.22 MIL/uL — AB (ref 3.87–5.11)
RDW: 14.1 % (ref 11.5–15.5)
WBC: 7.6 10*3/uL (ref 4.0–10.5)

## 2017-02-20 LAB — COMPREHENSIVE METABOLIC PANEL
ALK PHOS: 70 U/L (ref 38–126)
ALT: 9 U/L — AB (ref 14–54)
AST: 16 U/L (ref 15–41)
Albumin: 2 g/dL — ABNORMAL LOW (ref 3.5–5.0)
Anion gap: 8 (ref 5–15)
BUN: 7 mg/dL (ref 6–20)
CALCIUM: 8 mg/dL — AB (ref 8.9–10.3)
CHLORIDE: 102 mmol/L (ref 101–111)
CO2: 28 mmol/L (ref 22–32)
CREATININE: 0.72 mg/dL (ref 0.44–1.00)
GFR calc non Af Amer: >60 mL/min (ref >60)
GLUCOSE: 97 mg/dL (ref 65–99)
Potassium: 3.6 mmol/L (ref 3.5–5.1)
SODIUM: 138 mmol/L (ref 135–145)
Total Bilirubin: 0.3 mg/dL (ref 0.3–1.2)
Total Protein: 5.8 g/dL — ABNORMAL LOW (ref 6.5–8.1)

## 2017-02-20 LAB — PHOSPHORUS: PHOSPHORUS: 3.4 mg/dL (ref 2.5–4.6)

## 2017-02-20 LAB — MAGNESIUM: Magnesium: 2 mg/dL (ref 1.7–2.4)

## 2017-02-20 MED ORDER — HYDROCODONE-ACETAMINOPHEN 5-325 MG PO TABS: 1.0000 | ORAL_TABLET | Freq: Four times a day (QID) | ORAL | 0 refills | Status: DC | PRN

## 2017-02-20 MED ORDER — FERROUS SULFATE 325 (65 FE) MG PO TABS: 325.0000 mg | ORAL_TABLET | Freq: Two times a day (BID) | ORAL | Status: DC

## 2017-02-20 MED ORDER — FAMOTIDINE 20 MG PO TABS: 20.0000 mg | ORAL_TABLET | Freq: Two times a day (BID) | ORAL | 0 refills | Status: DC

## 2017-02-20 MED ORDER — FERROUS SULFATE 325 (65 FE) MG PO TABS: 325.0000 mg | ORAL_TABLET | Freq: Two times a day (BID) | ORAL | 0 refills | Status: DC

## 2017-02-20 MED ORDER — AMOXICILLIN-POT CLAVULANATE 875-125 MG PO TABS: 1.0000 | ORAL_TABLET | Freq: Two times a day (BID) | ORAL | 0 refills | Status: AC

## 2017-02-20 MED ORDER — HEPARIN SOD (PORK) LOCK FLUSH 100 UNIT/ML IV SOLN
500.0000 [IU] | INTRAVENOUS | Status: AC | PRN
  Administered 2017-02-20: 500 [IU]

## 2017-02-20 NOTE — Progress Notes (Signed)
Physical Therapy Treatment Patient Details Name: Kayla Guerrero MRN: [id number] DOB: 1935/09/09 Today's Date: 02/20/2017    History of Present Illness 82 yo female admitted with sepsis, pelvic fluid collection-s/p drain 5/23. Hx of OA, spinal stenosis, osteopenia, neuropathy, L foot drop, endometrial cancer, colon cancer    PT Comments    Pt mobilizing very well and reports she will likely d/c home today.  Pt lives with spouse and reports no concerns with d/c home.  Follow Up Recommendations  No PT follow up;Supervision for mobility/OOB     Equipment Recommendations  None recommended by PT    Recommendations for Other Services       Precautions / Restrictions Precautions Precautions: Fall Precaution Comments: R abdomen jp drain    Mobility  Bed Mobility Overal bed mobility: Modified Independent                Transfers Overall transfer level: Needs assistance   Transfers: Sit to/from Stand Sit to Stand: Supervision         General transfer comment: for safety  Ambulation/Gait Ambulation/Gait assistance: Supervision Ambulation Distance (Feet): 500 Feet Assistive device: None Gait Pattern/deviations: Decreased dorsiflexion - left     General Gait Details: supervision for safety, pt steady, slow pace, pt kept hands together behind herself most of ambulation   Stairs            Wheelchair Mobility    Modified Rankin (Stroke Patients Only)       Balance Overall balance assessment: No apparent balance deficits (not formally assessed)                                          Cognition Arousal/Alertness: Awake/alert Behavior During Therapy: WFL for tasks assessed/performed Overall Cognitive Status: Within Functional Limits for tasks assessed                                        Exercises      General Comments        Pertinent Vitals/Pain Pain Assessment: Faces Faces Pain Scale: Hurts little  more Pain Location: abdomen Pain Descriptors / Indicators: Sore Pain Intervention(s): Monitored during session;Limited activity within patient's tolerance    Home Living                      Prior Function            PT Goals (current goals can now be found in the care plan section) Progress towards PT goals: Progressing toward goals    Frequency    Min 3X/week      PT Plan Current plan remains appropriate    Co-evaluation              AM-PAC PT "6 Clicks" Daily Activity  Outcome Measure  Difficulty turning over in bed (including adjusting bedclothes, sheets and blankets)?: None Difficulty moving from lying on back to sitting on the side of the bed? : None Difficulty sitting down on and standing up from a chair with arms (e.g., wheelchair, bedside commode, etc,.)?: None Help needed moving to and from a bed to chair (including a wheelchair)?: None Help needed walking in hospital room?: A Little Help needed climbing 3-5 steps with a railing? : A Little 6 Click Score: 22  End of Session   Activity Tolerance: Patient tolerated treatment well Patient left: in chair;with call bell/phone within reach Nurse Communication: Mobility status PT Visit Diagnosis: Difficulty in walking, not elsewhere classified (R26.2)     Time: 1829-9371 PT Time Calculation (min) (ACUTE ONLY): 15 min  Charges:  $Gait Training: 8-22 mins                    G Codes:       Carmelia Bake, PT, DPT 02/20/2017 Pager: [phone]    York Ram E 02/20/2017, 11:26 AM

## 2017-02-20 NOTE — Care Management Note (Signed)
Case Management Note  Patient Details  Name: Kayla Guerrero MRN: [id number] Date of Birth: Apr 07, 1935  Subjective/Objective: d/c home w/[facility] rep Dian Situ aware of HHRN-jp drain flush/gauze qd,HHPT. No further CM needs.                   Action/Plan:d/c home w/HHC.   Expected Discharge Date:  02/20/17               Expected Discharge Plan:  [place]  In-House Referral:     Discharge planning Services  CM Consult  Post Acute Care Choice:    Choice offered to:  Patient  DME Arranged:    DME Agency:     HH Arranged:  RN, PT HH Agency:  [place]  Status of Service:  Completed, signed off  If discussed at [place] of Stay Meetings, dates discussed:    Additional Comments:  Dessa Phi, RN 02/20/2017, 12:06 PM

## 2017-02-20 NOTE — Progress Notes (Signed)
Pharmacy Antibiotic Note  Kayla Guerrero is a 82 y.o. female admitted on 02/17/2017 with abdominal infection/sepsis.  Patient directed to ED for elevated WBC, abdominal pain and weakness.  PMH significant for endometrial cancer. CT shows cystic lesion with signs of infection. Vancomycin 1gm and Zosyn 3.375gm IV x 1 dose each ordered to be given in the ED.  Pharmacy has been consulted for Vancomycin and Zosyn dosing.  Plan:  Continue Zosyn 3.375gm IV q8h (each dose infused over 4 hrs) Plan for transition to Augmentin  Height: 5\' 6"  (167.6 cm) Weight: 193 lb 5.5 oz (87.7 kg)  IBW/kg (Calculated) : 59.3  Temp (24hrs), Avg:98.4 F (36.9 C), Min:98.2 F (36.8 C), Max:98.6 F (37 C)   Recent Labs Lab 02/16/17 1202 02/17/17 1527 02/17/17 1531 02/18/17 0628 02/19/17 0523 02/20/17 0433  WBC 16.2* 16.4*  --  17.2* 10.1 7.6  CREATININE  --  0.82  --  0.80 0.75 0.72  LATICACIDVEN  --   --  0.70  --   --   --     Estimated Creatinine Clearance: 62.6 mL/min (by C-G formula based on SCr of 0.72 mg/dL).    Allergies  Allergen Reactions  . Levaquin [Levofloxacin Hemihydrate] Other (See Comments)  . Promethazine Hcl Other (See Comments)    fainting  . Sulfa Drugs Cross Reactors Swelling    Antimicrobials this admission:  5/22 vanc >>  5/24 5/22 zosyn >>    Dose adjustments this admission:  --  Microbiology results:  5/23 Pelvic abscess: few Viridans strep  Thank you for allowing pharmacy to be a part of this patient's care.  Peggyann Juba, PharmD, BCPS Pager: [phone] 02/20/2017 8:34 AM

## 2017-02-20 NOTE — Progress Notes (Signed)
Referring Physician(s): Ortiz,D/Newton,K  Supervising Physician: Sandi Mariscal  Patient Status:  [name]  Chief Complaint:  Pelvic fluid collection/abscess  Subjective: Pt doing fairly well; has some mild RLQ discomfort but otherwise tol diet ok, denies N/V; anticipate d/c home today  Allergies: Levaquin [levofloxacin hemihydrate]; Promethazine hcl; and Sulfa drugs cross reactors  Medications: Prior to Admission medications   Medication Sig Start Date End Date Taking? Authorizing Provider  Alpha-Lipoic Acid 600 MG CAPS Take 1 capsule by mouth daily.   Yes [provider]  b complex vitamins capsule Take 1 capsule by mouth daily.   Yes [provider]  Cholecalciferol (VITAMIN D) 2000 UNITS tablet Take 2,000 Units by mouth daily.   Yes [provider]  DULoxetine (CYMBALTA) 30 MG capsule Take 1 capsule (30 mg total) by mouth daily. 02/16/17  Yes Rita Ohara, MD  glucosamine-chondroitin 500-400 MG tablet Take 1 tablet by mouth 2 (two) times daily.    Yes [provider]  levothyroxine (SYNTHROID, LEVOTHROID) 88 MCG tablet TAKE ONE TABLET BY MOUTH ONCE DAILY BEFORE  BREAKFAST. 01/06/17  Yes Rita Ohara, MD  Magnesium 400 MG CAPS Take 1 capsule by mouth 2 (two) times daily.    Yes [provider]  MELATONIN PO Take 1 tablet by mouth at bedtime as needed (sleep).    Yes [provider]  Multiple Vitamin (MULTI-VITAMIN) tablet Take 1 tablet by mouth daily.     Yes [provider]  naproxen sodium (ANAPROX) 220 MG tablet Take 220 mg by mouth 2 (two) times daily as needed (pain).    Yes [provider]  polyethylene glycol (MIRALAX / GLYCOLAX) packet Take 17 g by mouth 2 (two) times daily as needed for mild constipation.    Yes [provider]  senna-docusate (SENOKOT S) 8.6-50 MG tablet Take 1 tablet by mouth 2 (two) times daily as needed for mild constipation.   Yes [provider]  terbinafine  (LAMISIL) 250 MG tablet Take 1 tablet (250 mg total) by mouth daily. Patient taking differently: Take 250 mg by mouth daily. Using every other day 12/23/16  Yes Hyatt, Max T, DPM  Wheat Dextrin (BENEFIBER DRINK MIX PO) Take 1 scoop by mouth 2 (two) times daily.    Yes [provider]  lidocaine-prilocaine (EMLA) cream  08/08/16   [provider]     Vital Signs: BP (!) 160/76 (BP Location: Left Arm)   Pulse 85   Temp 98.2 F (36.8 C) (Oral)   Resp 17   Ht 5\' 6"  (1.676 m)   Wt 193 lb 5.5 oz (87.7 kg)   SpO2 95%   BMI 31.21 kg/m   Physical Exam awake/alert; RLQ drain intact, output 115 cc; insertion site ok, mildly tender, abd soft,+BS  Imaging: Dg Chest 2 View  Result Date: 02/17/2017 CLINICAL DATA:  Shortness of breath for a couple of days with some dizziness. Former smoker. EXAM: CHEST  2 VIEW COMPARISON:  Chest CTA 07/29/2016 FINDINGS: Right jugular Port-A-Cath terminates over the lower SVC there is a small hiatal hernia. The cardiac silhouette is normal in size. Aortic atherosclerosis is noted. There is anterior eventration of the right hemidiaphragm. The lungs are clear. No pleural effusion or pneumothorax is identified. Thoracic spondylosis and chronic retrolisthesis of T12 on L1 are noted. IMPRESSION: No active cardiopulmonary disease. Electronically Signed   By: Logan Bores M.D.   On: 02/17/2017 15:21   Ct Abdomen Pelvis W Contrast  Result Date: 02/17/2017 CLINICAL  DATA:  Right lower quadrant pain and weakness history of endometrial cancer EXAM: CT ABDOMEN AND PELVIS WITH CONTRAST TECHNIQUE: Multidetector CT imaging of the abdomen and pelvis was performed using the standard protocol following bolus administration of intravenous contrast. CONTRAST:  119mL ISOVUE-300 IOPAMIDOL (ISOVUE-300) INJECTION 61% COMPARISON:  11/19/2016, 09/23/2016 FINDINGS: Lower chest: Lung bases demonstrate no acute consolidation or pleural effusion. Heart size nonenlarged. Hepatobiliary:  Stable subcentimeter hypodense lesions in the liver. Mild intra hepatic and extrahepatic biliary dilatation post cholecystectomy, also unchanged. Pancreas: Unremarkable. No pancreatic ductal dilatation or surrounding inflammatory changes. Spleen: Normal in size without focal abnormality. Adrenals/Urinary Tract: Adrenal glands are unremarkable. Kidneys show no hydronephrosis. Intermediate density lesion measuring 14 mm posterior cortex of the mid right kidney. Bladder is displaced to the left and appears slightly thick walled. Stomach/Bowel: Stomach nonenlarged. No dilated small bowel. Diffuse diverticular disease of the colon without acute inflammation. Vascular/Lymphatic: Aortic atherosclerosis. No significantly enlarged abdominal or pelvic lymph nodes. Reproductive: Status post hysterectomy. Other: No free air. No significant free fluid. Fat in the umbilicus. A previously stable thin walled cystic lesion in the right pelvis now demonstrates thick wall enhancement and has increased in size, now measuring 7.5 x 5.8 x 7.1 cm. There is edema and stranding within the fat surrounding the mass. Adjacent soft tissue thickening along the right pelvic sidewall. Mass effect on the bladder which is also slightly thick walled. Musculoskeletal: Degenerative changes at multiple levels. No acute or suspicious bone lesion. IMPRESSION: 1. The previously noted thin walled cystic lesion in the right pelvis has undergone change in appearance ; it is now thick-walled and rim enhancing. There is associated inflammation and edema surrounding the pelvic sidewall lesion which has also increased in size. This suggests interim inflammation/infection of the cystic collection. 2. Urinary bladder displaced to the left by the thick-walled right pelvic sidewall cystic lesion. Wall thickening of the bladder is likely reactive but could also be due to a cystitis. Suggest correlation with urinalysis. 3. Stable intra and extrahepatic biliary  dilatation post cholecystectomy 4. Stable 13 mm intermediate density right renal lesion 5. Diffuse diverticular disease of the colon without acute inflammation Electronically Signed   By: Donavan Foil M.D.   On: 02/17/2017 18:08   Ct Image Guided Drainage By Percutaneous Catheter  Result Date: 02/18/2017 INDICATION: Remote history of gynecologic malignancy, post surgical resection with persistent right pelvic fluid collection. Patient underwent CT-guided aspiration of this fluid collection on 09/30/2016 with cytologic analysis negative for evidence of malignancy. Unfortunately, patient returned to the emergency department yesterday with abdominal pain with CT scan demonstrating findings for superimposed infection of this recurrent/residual right pelvis sidewall collection. Request made for CT-guided percutaneous drainage catheter placement for infection source control. EXAM: CT IMAGE GUIDED DRAINAGE BY PERCUTANEOUS CATHETER COMPARISON:  CT abdomen pelvis - 02/17/2017; 11/19/2016; 09/23/2016; 05/16/2016 ; CT-guided right pelvic fluid collection aspiration -09/30/2016 MEDICATIONS: The patient is currently admitted to the hospital and receiving intravenous antibiotics. The antibiotics were administered within an appropriate time frame prior to the initiation of the procedure. ANESTHESIA/SEDATION: Moderate (conscious) sedation was employed during this procedure. A total of Versed 2 mg and Fentanyl 100 mcg was administered intravenously. Moderate Sedation Time: 17 minutes. The patient's level of consciousness and vital signs were monitored continuously by radiology nursing throughout the procedure under my direct supervision. CONTRAST:  None COMPLICATIONS: None immediate. PROCEDURE: Informed written consent was obtained from the patient after a discussion of the risks, benefits and alternatives to treatment. The patient was placed supine  on the CT gantry and a pre procedural CT was performed re-demonstrating the  known abscess/fluid collection within the right hemipelvis with dominant component measuring approximately 5.8 x 7.8 cm (image 25, series 2). The procedure was planned. A timeout was performed prior to the initiation of the procedure. The skin overlying the ventral aspect of the right lower abdomen/ pelvis was prepped and draped in the usual sterile fashion. The overlying soft tissues were anesthetized with 1% lidocaine with epinephrine. Appropriate trajectory was planned with the use of a 22 gauge spinal needle. An 18 gauge trocar needle was advanced into the abscess/fluid collection and a short Amplatz super stiff wire was coiled within the collection. Appropriate positioning was confirmed with a limited CT scan. The tract was serially dilated allowing placement of a 10 [place] all-purpose drainage catheter. Appropriate positioning was confirmed with a limited postprocedural CT scan. Approximately 50 mL of purulent fluid was aspirated. The tube was connected to a JP bulb and sutured in place. A dressing was placed. The patient tolerated the procedure well without immediate post procedural complication. IMPRESSION: Successful CT guided placement of a 10 French all purpose drain catheter into the residual/recurrent right pelvic sidewall fluid collection with aspiration of 50 mL of purulent fluid. Samples were sent to the laboratory for both culture and cytologic analysis. Electronically Signed   By: Sandi Mariscal M.D.   On: 02/18/2017 16:44    Labs:  CBC:  Recent Labs  02/17/17 1527 02/18/17 0628 02/19/17 0523 02/20/17 0433  WBC 16.4* 17.2* 10.1 7.6  HGB 11.0* 10.1* 9.2* 9.5*  HCT 32.2* 30.0* 27.6* 28.0*  PLT 507* 477* 471* 550*    COAGS:  Recent Labs  08/11/16 1256 09/30/16 1021 02/18/17 0940  INR 0.94 1.00 1.26  APTT  --  25 35    BMP:  Recent Labs  02/17/17 1527 02/18/17 0628 02/19/17 0523 02/20/17 0433  NA 132* 134* 136 138  K 3.7 3.7 3.8 3.6  CL 95* 101 102 102  CO2 25 25  27 28   GLUCOSE 106* 108* 101* 97  BUN 10 9 8 7   CALCIUM 8.1* 8.0* 7.8* 8.0*  CREATININE 0.82 0.80 0.75 0.72  GFRNONAA >60 >60 >60 >60  GFRAA >60 >60 >60 >60    LIVER FUNCTION TESTS:  Recent Labs  10/09/16 0935 02/17/17 1527 02/19/17 0523 02/20/17 0433  BILITOT 0.24 0.5 0.3 0.3  AST 16 17 14* 16  ALT <6 10* 9* 9*  ALKPHOS 73 83 66 70  PROT 6.7 6.8 5.5* 5.8*  ALBUMIN 3.3* 2.5* 1.9* 2.0*    Assessment and Plan: endom ca, s/p TAH/BSO 2017; now with recurrent rt pelvic fluid coll/?abscess; s/p rt pelvic drain 5/23; AF; WBC nl; hgb stable; creat nl; drain fluid cx's prelim- viridans strept; cytology pend; plan is for pt to be discharged home with drain in place; we will schedule her for follow-up CT in IR clinic next week; recommend daily dressing changes to site, output recording and flushing of drain with 5 mL sterile normal saline once daily as outpatient. Above instructions reviewed with patient.  Electronically Signed: D. Rowe Robert, PA-C 02/20/2017, 10:11 AM   I spent a total of 20 minutes at the the patient's bedside AND on the patient's hospital floor or unit, greater than 50% of which was counseling/coordinating care for pelvic fluid collection drainage    Patient ID: Kayla Guerrero, female   DOB: Feb 03, 1935, 82 y.o.   MRN: [id number]

## 2017-02-20 NOTE — Discharge Summary (Signed)
Physician Discharge Summary  Kayla Guerrero SEG:[id number] DOB: November 01, 1934 DOA: 02/17/2017  PCP: Rita Ohara, MD  Admit date: 02/17/2017 Discharge date: 02/20/2017  Admitted From: Home Disposition:  [place] RN/PT  Recommendations for Outpatient Follow-up:  1. Follow up with PCP in 1-2 weeks 2. Follow up with IR Drain Clinic and repeat CT Scan within 1 week; Home Health RN will have dressing changes and flushes  3. Follow up with Oncology Dr. Alvy Bimler as an outpatient 4. Follow up with Gynecology as an outpatient  5. Please obtain CMP/CBC, Mag, Phos in one week 6. Follow up on the Fluid Cytology   Home Health: YES Equipment/Devices: None  Discharge Condition: Stable CODE STATUS: FULL CODE Diet recommendation: Heart Healthy   Brief/Interim Summary: Kayla Morr Jochumis a 82 y.o.femalewith medical history significant of osteoarthritis, spinal stenosis, bilateral lower extremity neuropathy, endometrial cancer, depression, history of radiation therapy, cataracts, hyperlipidemia, colonic polyps, GERD, Hypothyroidism, obesity, osteopenia and other comorbids who is coming to the emergency department after oncology follow-up due to lower abdominal pain, diarrhea for 4 days associated with weakness, hypersomnolence following an episode of a gastroenteritis about a week ago after returning from the beach. She saw Dr. Kelby Aline the day before she presented to the emergency department due to above symptoms, orthostatic hypotension and leukocytosis.   Per patient, she has been having abdominal pain for about 4 days. She initially had dry heaving with an episode of water and bile emesis. She denies further emesis since then. She has had diarrhea alternating with constipation. She denies melena, hematochezia, dysuria or frequency, but states that she has been urinating very little volume compared to usual in the past 2 days. Other symptoms have been fever, dizziness, fatigue, dry cough, decreased  appetite and hypersomnolence.   She was told to decrease her Cymbalta to decrease somnolence. She denied chest pain, palpitations, dyspnea, PND, orthopnea or pitting edema of the lower extremities. CT Abd/Pelvis was done which revealed that the previously seen thinwall cystic lesions in the right pelvis is now take wall and ring-enhancing with associated inflammation and edema surrounding the pelvic sidewall lesions which also has increased in size. This suggests inter-inflammation/infection of the cystic collection. The urinary bladder is displaced to the left by these lesions. General Surgery, IR, and GYN were consulted for further recc's.She underwent IR Drain Right Pelvic Drain placement on 5/23 and is improving. WBC trending down however still has some abdominal Pain. Drain Fluid Cx grew out some Streptococcus Viridans and Cytology is pending.   Discharge Diagnoses:  Principal Problem:   Sepsis ([place]) Active Problems:   GERD (gastroesophageal reflux disease)   Chemotherapy-induced peripheral neuropathy (HCC)   Pelvic fluid collection   Anemia   Hyponatremia   Hypothyroidism  Sepsis ([place]) likely to Pelvic Fluid Collection/Abscess s/p JP Drain placement, improved -Sepsis Physiology is improved and pain is managable with po pain medications -Secondary to pelvic fluid collection. -Patient is s/p 2.5 Liter boluses -D/C'd IV fluid with NS + 20 mEQ of KCl at 75 mL/hr yesterdat -Discontinued IV Vancomycin; Changed IV Zosyn to po Augmentin per ID Recc's -Patient is s/p Right Pelvic Drain Placement by IR on 02/19/17 -Drain fluid Cx Gram stain showed Moderate WBC Present, predominantly PMN and grew out few Gram Positive Cocci in Pairs; C/x grew out Few Viridans Streptococcus that was only resistant to Erythromycin and Pansensitive -Cytology is pending -IR recommends continuing drain irrigation and repeating CT scan within 1 week of drain placement and following up as Inpatient or  Outpatient at [place] and feel no role for General Surgery at this Point and recommended GYN Consult -Gyn Dr. Claudia Pollock and feel like fluid collection is unlikely reproductive organ related as reproductive organs were removed; Reccommended Abx coverage and IR drainage with Cultures and Cytology -WBC went from 16.4 -> 17.2 -> 10.1 -> 7.6 -Blood Cx not Done and of little value now that patient is on Broad Spectrum Abx; Will narrow Antibiotic Therapy and discussed case with Dr. Tommy Medal and he recommended narrowing to Augmentin and treating for at least a month or until infection clears. Will need outpatient PCP/Oncology to re-evaluate Abx Duration  -U/A was Negative -Lactic Acid Level was 0.70 -Pain Control with Fenatnyl 25 mcg IV q2hprn and IV Toradol was given once -Repeat CMP as an outpatient -PT recommended no PT Follow up vs. Home Health PT yesterday but recommended no PT follow up now  Pelvic fluid collection/Abscess -Changed IV antibiotics as Above to po Augmentin  -Cx and Cytology sent off; Cx grew Few Viridans Streptococcus and Cytology pending  -Patient is s/p Right Pelvic Drain Placement by IR on 02/19/17 -Drain fluid Cx Gram stain showed Moderate WBC Present, predominantly PMN and grew out few Gram Positive Cocci in Pairs; C/x grew out Few Viridans Streptococcus  -General Surgery Consulted and feel no role for General Surgery at this Point and recommended GYN Consult -Gyn Dr. Ernestina Patches Consulted and feel like fluid collection is unlikely reproductive organ related as reproductive organs were removed; Recommened Abx coverage and IR grainage with Cultures and Cytology -Discussed with ID Dr. Dietrich [place] and he recommended narrowing therapy to po Augmentin prior to D/C and treating for at least a month. Patient will need to follow with IR [facility] and outpatient GYN as well as Oncology and PCP. Abx length to be Determined pending how long drain is in and clinical  response  Iron Deficiency Anemia -Hb/Hct dropped from 11.9/36.0 -> 10.1/30.0 -> 9.2/27.6 -> 9.5/28.0; -Possible Dilutional Drop component; IVF now stopped -Anemia Panel showed Iron level of 9, UIBC of 148, TIBC of 157, Saturation Ratios of 6, Ferritin of 181, Folate of 29.0, Vitamin B12 was 267 -Will likely need Iron Supplementation after Infection settles down; Started patient on po Iron Ferrous Sulfate 325 mg po BID -Repeat CBC as an outpatient   Hyponatremia, improved -Improving as Na+ went from 132 -> 134 -> 136 -> 138 -D/C'd normal saline infusion. -Repeat CMP as an outpatient   Hypothyroidism -Continue levothyroxine 88 g by mouth daily. -TSH monitoring as an outpatient.   Thrombocytosis -Likely reactive from Infection -Improving and trending down from 529 -> 477 -> 471 -> 550  -Repeat CBC as an outpatient   Severe Peripheral Neuropathy with Left Foot Drop -Chemotherapy Induced  -C/w Duloxetine 30 mg po Daily -PT recommends no PT follow up vs. Home Health PT (pending clinical course)  GERD -C/w Famotidine 20 mg po BID  Hx of Endometrial Cancer s/p Robotic Hysterectomy with BSO and Chemotherapy -S/p Paclitaxel and Carboplatin -Gyn Consulted and feel fluid collection is unlikely reproductive organ related -Continue to Monitor -Outpatient follow up with Oncology Dr. Alvy Bimler and Outpatient GYN  Discharge Instructions  Discharge Instructions    Call MD for:  difficulty breathing, headache or visual disturbances    Complete by:  As directed    Call MD for:  extreme fatigue    Complete by:  As directed    Call MD for:  persistant dizziness or  light-headedness    Complete by:  As directed    Call MD for:  persistant nausea and vomiting    Complete by:  As directed    Call MD for:  redness, tenderness, or signs of infection (pain, swelling, redness, odor or green/yellow discharge around incision site)    Complete by:  As directed    Call MD for:  severe  uncontrolled pain    Complete by:  As directed    Call MD for:  temperature >100.4    Complete by:  As directed    Diet - low sodium heart healthy    Complete by:  As directed    Discharge instructions    Complete by:  As directed    Follow up with PCP, Oncology, Interventional Radiology and Gynecology as an outpatient. Take all medications as prescribed. Have repeat CT scan in 1 week and follow up with IR [facility]. If symptoms change or worsen please return to the ED for evaluation.   Increase activity slowly    Complete by:  As directed      Allergies as of 02/20/2017      Reactions   Levaquin [levofloxacin Hemihydrate] Other (See Comments)   Promethazine Hcl Other (See Comments)   fainting   Sulfa Drugs Cross Reactors Swelling      Medication List    TAKE these medications   Alpha-Lipoic Acid 600 MG Caps Take 1 capsule by mouth daily.   amoxicillin-clavulanate 875-125 MG tablet Commonly known as:  AUGMENTIN Take 1 tablet by mouth every 12 (twelve) hours.   b complex vitamins capsule Take 1 capsule by mouth daily.   BENEFIBER DRINK MIX PO Take 1 scoop by mouth 2 (two) times daily.   DULoxetine 30 MG capsule Commonly known as:  CYMBALTA Take 1 capsule (30 mg total) by mouth daily.   famotidine 20 MG tablet Commonly known as:  PEPCID Take 1 tablet (20 mg total) by mouth 2 (two) times daily.   ferrous sulfate 325 (65 FE) MG tablet Take 1 tablet (325 mg total) by mouth 2 (two) times daily with a meal.   glucosamine-chondroitin 500-400 MG tablet Take 1 tablet by mouth 2 (two) times daily.   HYDROcodone-acetaminophen 5-325 MG tablet Commonly known as:  NORCO/VICODIN Take 1 tablet by mouth every 6 (six) hours as needed for moderate pain.   levothyroxine 88 MCG tablet Commonly known as:  SYNTHROID, LEVOTHROID TAKE ONE TABLET BY MOUTH ONCE DAILY BEFORE  BREAKFAST.   lidocaine-prilocaine cream Commonly known as:  EMLA   Magnesium 400 MG Caps Take 1 capsule by  mouth 2 (two) times daily.   MELATONIN PO Take 1 tablet by mouth at bedtime as needed (sleep).   MULTI-VITAMIN tablet Take 1 tablet by mouth daily.   naproxen sodium 220 MG tablet Commonly known as:  ANAPROX Take 220 mg by mouth 2 (two) times daily as needed (pain).   polyethylene glycol packet Commonly known as:  MIRALAX / GLYCOLAX Take 17 g by mouth 2 (two) times daily as needed for mild constipation.   SENOKOT S 8.6-50 MG tablet Generic drug:  senna-docusate Take 1 tablet by mouth 2 (two) times daily as needed for mild constipation.   terbinafine 250 MG tablet Commonly known as:  LAMISIL Take 1 tablet (250 mg total) by mouth daily. What changed:  additional instructions   Vitamin D 2000 units tablet Take 2,000 Units by mouth daily.      Follow-up Information    [facility] Follow up.   Specialty:  [place] Why:  [facility], HH nursing-jp drain care/flush/gauze Contact information: [place] [address] [phone]        Rita Ohara, MD. Call in 1 week(s).   Specialty:  Family Medicine Why:  Call to schedule an appointment within 1 week.  Contact information: [address] [place] [address] [phone]        [name], MD. Call in 1 week(s).   Specialty:  Hematology and Oncology Why:  Call to schedule follow up appointment with Dr. Alvy Bimler in 1 week Contact information: [place] [address] [phone]        Sandi Mariscal, MD. Call in 1 week(s).   Specialty:  Interventional Radiology Why:  Call to schedule appointment with [facility] in 1 week to follow up and have repeat CT Scan Contact information: [address] [place] [address] [phone]          Allergies  Allergen Reactions  . Levaquin [Levofloxacin Hemihydrate] Other (See Comments)  . Promethazine Hcl Other (See Comments)    fainting  .  Sulfa Drugs Cross Reactors Swelling    Consultations:  General Surgery Dr. Ninfa Linden  GYN Dr. Ernestina Patches  Interventional Radiology Dr. Pascal Lux  Discussed Case with Infectious Diseases Dr. Tommy Medal  Procedures/Studies: Dg Chest 2 View  Result Date: 02/17/2017 CLINICAL DATA:  Shortness of breath for a couple of days with some dizziness. Former smoker. EXAM: CHEST  2 VIEW COMPARISON:  Chest CTA 07/29/2016 FINDINGS: Right jugular Port-A-Cath terminates over the lower SVC there is a small hiatal hernia. The cardiac silhouette is normal in size. Aortic atherosclerosis is noted. There is anterior eventration of the right hemidiaphragm. The lungs are clear. No pleural effusion or pneumothorax is identified. Thoracic spondylosis and chronic retrolisthesis of T12 on L1 are noted. IMPRESSION: No active cardiopulmonary disease. Electronically Signed   By: Logan Bores M.D.   On: 02/17/2017 15:21   Ct Abdomen Pelvis W Contrast  Result Date: 02/17/2017 CLINICAL DATA:  Right lower quadrant pain and weakness history of endometrial cancer EXAM: CT ABDOMEN AND PELVIS WITH CONTRAST TECHNIQUE: Multidetector CT imaging of the abdomen and pelvis was performed using the standard protocol following bolus administration of intravenous contrast. CONTRAST:  169mL ISOVUE-300 IOPAMIDOL (ISOVUE-300) INJECTION 61% COMPARISON:  11/19/2016, 09/23/2016 FINDINGS: Lower chest: Lung bases demonstrate no acute consolidation or pleural effusion. Heart size nonenlarged. Hepatobiliary: Stable subcentimeter hypodense lesions in the liver. Mild intra hepatic and extrahepatic biliary dilatation post cholecystectomy, also unchanged. Pancreas: Unremarkable. No pancreatic ductal dilatation or surrounding inflammatory changes. Spleen: Normal in size without focal abnormality. Adrenals/Urinary Tract: Adrenal glands are unremarkable. Kidneys show no hydronephrosis. Intermediate density lesion measuring 14 mm posterior cortex of the mid right kidney.  Bladder is displaced to the left and appears slightly thick walled. Stomach/Bowel: Stomach nonenlarged. No dilated small bowel. Diffuse diverticular disease of the colon without acute inflammation. Vascular/Lymphatic: Aortic atherosclerosis. No significantly enlarged abdominal or pelvic lymph nodes. Reproductive: Status post hysterectomy. Other: No free air. No significant free fluid. Fat in the umbilicus. A previously stable thin walled cystic lesion in the right pelvis now demonstrates thick wall enhancement and has increased in size, now measuring 7.5 x 5.8 x 7.1 cm. There is edema and stranding within the fat surrounding the mass. Adjacent soft tissue thickening along the right pelvic sidewall. Mass effect on the bladder which is also slightly thick  walled. Musculoskeletal: Degenerative changes at multiple levels. No acute or suspicious bone lesion. IMPRESSION: 1. The previously noted thin walled cystic lesion in the right pelvis has undergone change in appearance ; it is now thick-walled and rim enhancing. There is associated inflammation and edema surrounding the pelvic sidewall lesion which has also increased in size. This suggests interim inflammation/infection of the cystic collection. 2. Urinary bladder displaced to the left by the thick-walled right pelvic sidewall cystic lesion. Wall thickening of the bladder is likely reactive but could also be due to a cystitis. Suggest correlation with urinalysis. 3. Stable intra and extrahepatic biliary dilatation post cholecystectomy 4. Stable 13 mm intermediate density right renal lesion 5. Diffuse diverticular disease of the colon without acute inflammation Electronically Signed   By: Donavan Foil M.D.   On: 02/17/2017 18:08   Ct Image Guided Drainage By Percutaneous Catheter  Result Date: 02/18/2017 INDICATION: Remote history of gynecologic malignancy, post surgical resection with persistent right pelvic fluid collection. Patient underwent CT-guided  aspiration of this fluid collection on 09/30/2016 with cytologic analysis negative for evidence of malignancy. Unfortunately, patient returned to the emergency department yesterday with abdominal pain with CT scan demonstrating findings for superimposed infection of this recurrent/residual right pelvis sidewall collection. Request made for CT-guided percutaneous drainage catheter placement for infection source control. EXAM: CT IMAGE GUIDED DRAINAGE BY PERCUTANEOUS CATHETER COMPARISON:  CT abdomen pelvis - 02/17/2017; 11/19/2016; 09/23/2016; 05/16/2016 ; CT-guided right pelvic fluid collection aspiration -09/30/2016 MEDICATIONS: The patient is currently admitted to the hospital and receiving intravenous antibiotics. The antibiotics were administered within an appropriate time frame prior to the initiation of the procedure. ANESTHESIA/SEDATION: Moderate (conscious) sedation was employed during this procedure. A total of Versed 2 mg and Fentanyl 100 mcg was administered intravenously. Moderate Sedation Time: 17 minutes. The patient's level of consciousness and vital signs were monitored continuously by radiology nursing throughout the procedure under my direct supervision. CONTRAST:  None COMPLICATIONS: None immediate. PROCEDURE: Informed written consent was obtained from the patient after a discussion of the risks, benefits and alternatives to treatment. The patient was placed supine on the CT gantry and a pre procedural CT was performed re-demonstrating the known abscess/fluid collection within the right hemipelvis with dominant component measuring approximately 5.8 x 7.8 cm (image 25, series 2). The procedure was planned. A timeout was performed prior to the initiation of the procedure. The skin overlying the ventral aspect of the right lower abdomen/ pelvis was prepped and draped in the usual sterile fashion. The overlying soft tissues were anesthetized with 1% lidocaine with epinephrine. Appropriate trajectory  was planned with the use of a 22 gauge spinal needle. An 18 gauge trocar needle was advanced into the abscess/fluid collection and a short Amplatz super stiff wire was coiled within the collection. Appropriate positioning was confirmed with a limited CT scan. The tract was serially dilated allowing placement of a 10 [place] all-purpose drainage catheter. Appropriate positioning was confirmed with a limited postprocedural CT scan. Approximately 50 mL of purulent fluid was aspirated. The tube was connected to a JP bulb and sutured in place. A dressing was placed. The patient tolerated the procedure well without immediate post procedural complication. IMPRESSION: Successful CT guided placement of a 10 French all purpose drain catheter into the residual/recurrent right pelvic sidewall fluid collection with aspiration of 50 mL of purulent fluid. Samples were sent to the laboratory for both culture and cytologic analysis. Electronically Signed   By: Sandi Mariscal M.D.   On: 02/18/2017 16:44  Subjective: Seen and examined at bedside and stated pain was better controlled. No nausea or vomiting. States JP drain is still draining some. No other concerns or complaints and wants to go home.   Discharge Exam: Vitals:   02/19/17 2206 02/20/17 0430  BP: (!) 164/85 (!) 160/76  Pulse: 94 85  Resp: 18 17  Temp: 98.3 F (36.8 C) 98.2 F (36.8 C)   Vitals:   02/19/17 0635 02/19/17 1335 02/19/17 2206 02/20/17 0430  BP: 136/66 (!) 169/89 (!) 164/85 (!) 160/76  Pulse: 91 87 94 85  Resp: 20 16 18 17   Temp: 99.1 F (37.3 C) 98.6 F (37 C) 98.3 F (36.8 C) 98.2 F (36.8 C)  TempSrc: Oral Oral Oral Oral  SpO2: 94% 97% 96% 95%  Weight:      Height:       General: Pt is alert, awake, not in acute distress Cardiovascular: RRR, S1/S2 +, no rubs, no gallops Respiratory: CTA bilaterally, no wheezing, no rhonchi; Patient is not using any accessory muscles to breathe Abdominal: Soft, Mildly tender, ND, bowel sounds +;  Lower Abdomen has JP drain in place draining purulent material  Extremities: no edema, no cyanosis  The results of significant diagnostics from this hospitalization (including imaging, microbiology, ancillary and laboratory) are listed below for reference.    Microbiology: Recent Results (from the past 240 hour(s))  Aerobic/Anaerobic Culture (surgical/deep wound)     Status: None (Preliminary result)   Collection Time: 02/18/17  4:00 PM  Result Value Ref Range Status   Specimen Description ABSCESS PELVIS  Final   Special Requests Normal  Final   Gram Stain   Final    MODERATE WBC PRESENT, PREDOMINANTLY PMN FEW GRAM POSITIVE COCCI IN PAIRS    Culture FEW VIRIDANS STREPTOCOCCUS  Final   Report Status PENDING  Incomplete   Organism ID, Bacteria VIRIDANS STREPTOCOCCUS  Final      Susceptibility   Viridans streptococcus - MIC*    PENICILLIN 0.12 SENSITIVE Sensitive     CEFTRIAXONE 0.25 SENSITIVE Sensitive     ERYTHROMYCIN >=8 RESISTANT Resistant     LEVOFLOXACIN 2 SENSITIVE Sensitive     VANCOMYCIN Value in next row Sensitive      0.25 SENSITIVEPerformed at [facility], [address], [place], [place] [address]    * FEW VIRIDANS STREPTOCOCCUS    Labs: BNP (last 3 results) No results for input(s): BNP in the last 8760 hours. Basic Metabolic Panel:  Recent Labs Lab 02/17/17 1527 02/18/17 0628 02/19/17 0523 02/20/17 0433  NA 132* 134* 136 138  K 3.7 3.7 3.8 3.6  CL 95* 101 102 102  CO2 25 25 27 28   GLUCOSE 106* 108* 101* 97  BUN 10 9 8 7   CREATININE 0.82 0.80 0.75 0.72  CALCIUM 8.1* 8.0* 7.8* 8.0*  MG  --   --  1.8 2.0  PHOS  --   --  3.4 3.4   Liver Function Tests:  Recent Labs Lab 02/17/17 1527 02/19/17 0523 02/20/17 0433  AST 17 14* 16  ALT 10* 9* 9*  ALKPHOS 83 66 70  BILITOT 0.5 0.3 0.3  PROT 6.8 5.5* 5.8*  ALBUMIN 2.5* 1.9* 2.0*   No results for input(s): LIPASE, AMYLASE in the last 168 hours. No results for input(s): AMMONIA in the last 168  hours. CBC:  Recent Labs Lab 02/16/17 1202 02/17/17 1527 02/18/17 0628 02/19/17 0523 02/20/17 0433  WBC 16.2* 16.4* 17.2* 10.1 7.6  NEUTROABS 14,256* 14.6* 15.7* 8.4*  5.6  HGB 11.9 11.0* 10.1* 9.2* 9.5*  HCT 36.0 32.2* 30.0* 27.6* 28.0*  MCV 88.2 86.3 86.7 87.6 87.0  PLT 529* 507* 477* 471* 550*   Cardiac Enzymes: No results for input(s): CKTOTAL, CKMB, CKMBINDEX, TROPONINI in the last 168 hours. BNP: Invalid input(s): POCBNP CBG: No results for input(s): GLUCAP in the last 168 hours. D-Dimer No results for input(s): DDIMER in the last 72 hours. Hgb A1c No results for input(s): HGBA1C in the last 72 hours. Lipid Profile No results for input(s): CHOL, HDL, LDLCALC, TRIG, CHOLHDL, LDLDIRECT in the last 72 hours. Thyroid function studies No results for input(s): TSH, T4TOTAL, T3FREE, THYROIDAB in the last 72 hours.  Invalid input(s): FREET3 Anemia work up  Recent Labs  02/18/17 0628  VITAMINB12 267  FOLATE 29.0  FERRITIN 181  TIBC 157*  IRON 9*  RETICCTPCT 0.6   Urinalysis    Component Value Date/Time   COLORURINE YELLOW 02/17/2017 1654   APPEARANCEUR CLEAR 02/17/2017 1654   LABSPEC 1.005 02/17/2017 1654   PHURINE 7.0 02/17/2017 1654   GLUCOSEU NEGATIVE 02/17/2017 1654   HGBUR SMALL (A) 02/17/2017 1654   BILIRUBINUR NEGATIVE 02/17/2017 1654   BILIRUBINUR neg 02/16/2017 1259   KETONESUR NEGATIVE 02/17/2017 1654   PROTEINUR NEGATIVE 02/17/2017 1654   UROBILINOGEN 0.2 02/16/2017 1259   NITRITE NEGATIVE 02/17/2017 1654   LEUKOCYTESUR NEGATIVE 02/17/2017 1654   Sepsis Labs Invalid input(s): PROCALCITONIN,  WBC,  LACTICIDVEN Microbiology Recent Results (from the past 240 hour(s))  Aerobic/Anaerobic Culture (surgical/deep wound)     Status: None (Preliminary result)   Collection Time: 02/18/17  4:00 PM  Result Value Ref Range Status   Specimen Description ABSCESS PELVIS  Final   Special Requests Normal  Final   Gram Stain   Final    MODERATE WBC PRESENT,  PREDOMINANTLY PMN FEW GRAM POSITIVE COCCI IN PAIRS    Culture FEW VIRIDANS STREPTOCOCCUS  Final   Report Status PENDING  Incomplete   Organism ID, Bacteria VIRIDANS STREPTOCOCCUS  Final      Susceptibility   Viridans streptococcus - MIC*    PENICILLIN 0.12 SENSITIVE Sensitive     CEFTRIAXONE 0.25 SENSITIVE Sensitive     ERYTHROMYCIN >=8 RESISTANT Resistant     LEVOFLOXACIN 2 SENSITIVE Sensitive     VANCOMYCIN Value in next row Sensitive      0.25 SENSITIVEPerformed at [facility], [address], [place], [place] [address]    * FEW VIRIDANS STREPTOCOCCUS   Time coordinating discharge: 35 minutes  SIGNED:  Kerney Elbe, DO [facility] 02/20/2017, 1:15 PM Pager [phone]  If 7PM-7AM, please contact night-coverage [email or web address] Password [email or web address]

## 2017-02-21 NOTE — Progress Notes (Addendum)
Message received for 4E charge RN that pt called stating she has not heard from [facility], contacted [place] and states pt was referred to [facility] because they do have [facility] RN available and [facility] does except insurance. Contacted [facility] Liaison, spoke to Triage RN Raquel Sarna states she will follow up on referral and give NCM a call back. Jonnie Finner RN CCM Case Mgmt phone [phone]  NCM received call from [facility] and [facility] RN is scheduled for a visit today. NCM contacted pt to make aware. Jonnie Finner RN CCM Case Mgmt phone [phone]

## 2017-02-24 ENCOUNTER — Other Ambulatory Visit: Payer: Self-pay | Admitting: Hematology and Oncology

## 2017-02-24 DIAGNOSIS — N739 Female pelvic inflammatory disease, unspecified: Secondary | ICD-10-CM

## 2017-02-24 NOTE — Progress Notes (Signed)
Chief Complaint  Patient presents with  . Follow-up    hospital follow up-still feeling very fatigued and sleepy all the time.     Patient was hospitalized on 5/22-5/25.  She was sent to ER after receiving her WBC results, with persistent abdominal pain.  CT showed that the previously seen thin wall cystic lesions in the right pelvis was thickened, and ring-enhancing with associated inflammation and edema surrounding the pelvic sidewall lesions which also has increased in size. This suggests inflammation/infection of the cystic collection. The urinary bladder was displaced to the left by these lesions. General Surgery, IR, and GYN were consulted.She underwent IR Drain Right Pelvic Drain placement on 5/23   She was discharged on Augmentin.  She denies side effects.  Per hospitalist discharge summary: Recommendations for Outpatient Follow-up:  1. Follow up with PCP in 1-2 weeks 2. Follow up with IR Drain Clinic and repeat CT Scan within 1 week  (had today, see below); Home Health RN will have dressing changes and flushes 3. Follow up with Oncology Dr. Alvy Bimler as an outpatient 4. Follow up with Gynecology as an outpatient  5. Please obtain CMP/CBC, Mag, Phos in one week 6. Follow up on the Fluid Cytology    Overall she is still feeling very sleepy/tired/weak.  Wondering if it could be from the duloxetine. Very exhausted with activities--specifically got short of breath bathing today.  Doesn't feel like reading, all she wants to do is sleep.  Eating and watching TV is all she wants to do, which is very unusual for her.  She isn't allowed to get in the pool with the drain.  PMH, PSH, SH reviewed  Outpatient Encounter Prescriptions as of 02/25/2017  Medication Sig Note  . amoxicillin-clavulanate (AUGMENTIN) 875-125 MG tablet Take 1 tablet by mouth every 12 (twelve) hours.   [name] b complex vitamins capsule Take 1 capsule by mouth daily.   . Cholecalciferol (VITAMIN D) 2000 UNITS tablet Take 2,000  Units by mouth daily.   . DULoxetine (CYMBALTA) 30 MG capsule Take 1 capsule (30 mg total) by mouth daily.   . famotidine (PEPCID) 20 MG tablet Take 1 tablet (20 mg total) by mouth 2 (two) times daily.   . ferrous sulfate 325 (65 FE) MG tablet Take 1 tablet (325 mg total) by mouth 2 (two) times daily with a meal.   . glucosamine-chondroitin 500-400 MG tablet Take 1 tablet by mouth 2 (two) times daily.    [name] HYDROcodone-acetaminophen (NORCO/VICODIN) 5-325 MG tablet Take 1 tablet by mouth every 6 (six) hours as needed for moderate pain. 02/25/2017: Using prn pain, mostly at night, not more than 2/day  . levothyroxine (SYNTHROID, LEVOTHROID) 88 MCG tablet TAKE ONE TABLET BY MOUTH ONCE DAILY BEFORE  BREAKFAST.   . Magnesium 400 MG CAPS Take 1 capsule by mouth 2 (two) times daily.    . Multiple Vitamin (MULTI-VITAMIN) tablet Take 1 tablet by mouth daily.     [name] senna-docusate (SENOKOT S) 8.6-50 MG tablet Take 1 tablet by mouth 2 (two) times daily as needed for mild constipation.   . Wheat Dextrin (BENEFIBER DRINK MIX PO) Take 1 scoop by mouth 2 (two) times daily.    . Alpha-Lipoic Acid 600 MG CAPS Take 1 capsule by mouth daily.   [name] lidocaine-prilocaine (EMLA) cream  10/09/2016: Received from: External Pharmacy  . naproxen sodium (ANAPROX) 220 MG tablet Take 220 mg by mouth 2 (two) times daily as needed (pain).    . polyethylene glycol (MIRALAX / GLYCOLAX) packet  Take 17 g by mouth 2 (two) times daily as needed for mild constipation.    . terbinafine (LAMISIL) 250 MG tablet Take 1 tablet (250 mg total) by mouth daily. (Patient not taking: Reported on 02/25/2017) 02/18/2017: Clarify whether taking daily or every other day.  . [DISCONTINUED] MELATONIN PO Take 1 tablet by mouth at bedtime as needed (sleep).     Facility-Administered Encounter Medications as of 02/25/2017  Medication  . influenza  inactive virus vaccine (FLUZONE/FLUARIX) injection 0.5 mL   Allergies  Allergen Reactions  . Levaquin  [Levofloxacin Hemihydrate] Other (See Comments)  . Promethazine Hcl Other (See Comments)    fainting  . Sulfa Drugs Cross Reactors Swelling   ROS:  Denies fevers.  No further dizziness. No URI symptoms. Denies snoring, apnea. She has some tenderness at drain site. Having some heartburn after eating. No vomiting. No bleeding, bruising, rash   PHYSICAL EXAM:  BP 110/70 (BP Location: Left Arm, Patient Position: Sitting, Cuff Size: Normal)   Pulse 92   Ht '5\' 6"'$  (1.676 m)   Wt 186 lb 3.2 oz (84.5 kg)   BMI 30.05 kg/m   Pleasant female, not ill-appearing, in no distress HEENT: PERRL, EOMI, conjunctiva and sclera are clear.  OP is clear, moist mucus membranes Neck: no lymphadenopathy, thyromegaly or mass Heart: regular rate and rhythm Lungs: clear bilaterally Back: no CVA or spinal tenderness Abdomen: drain in place, with thin white drainage (they believe some of the rinse is mixed with the normal drainage).  She is mildly tender over the drain site and just above it.  Bandage not removed today--they report earlier with dressing change/flush there was no erythema of skin or rash Extremities: no edema Neuro: alert and oriented, normal gait, strength  Cytology results not yet available (doesn't appear that specimen has been received?? Will look into this)   Fluid culture results: Specimen Description ABSCESS PELVIS   Special Requests Normal   Gram Stain MODERATE WBC PRESENT, PREDOMINANTLY PMN  FEW GRAM POSITIVE COCCI IN PAIRS      Culture FEW STREPTOCOCCUS GALLOLYTICUS  HOLDING FOR POSSIBLE ANAEROBE  Performed at [facility], [place] [address] [place], [place] [address]      Report Status PENDING   Organism ID, Bacteria STREPTOCOCCUS GALLOLYTICUS   Resulting Agency [facility]  Susceptibility    Streptococcus gallolyticus    MIC    ERYTHROMYCIN >=8 RESISTANT  Resistant    LEVOFLOXACIN 2 SENSITIVE  Sensitive    VANCOMYCIN 0.25 SENSIT... Sensitive         Susceptibility  Comments   Streptococcus gallolyticus  FEW STREPTOCOCCUS GALLOLYTICUS      CT scan from today:  Significant improvement in the bilobed right pelvic sidewall abscess following percutaneous drain. Stable drain catheter position. Small residual abscess is noted, measurements as above. No new abscess. Otherwise stable CT of the abdomen pelvis with contrast.    ASSESSMENT/PLAN:  Pelvic abscess in female - drain in place, improving; complete course of ABX. repeat scan 2wks as scheduled. f/u cytology results - Plan: CBC with Differential/Platelet  Medication monitoring encounter - Plan: Magnesium, Comprehensive metabolic panel, CBC with Differential/Platelet, Phosphorus  Other fatigue - Ddx reviewed--suspect related to illness/infection, cannot r/o SI from cymbalta--will taper  Depression, major, single episode, mild (HCC) - she believes cymbalta contributing to fatigue (tolerated '20mg'$  though); will taper and increase dose back if recurrent depression/worsening neuropathy   Call for pathology results CBC, c-met, Mg, Phos     Try changing the timing of the  Pepcid to taking it before meals, to see if it is more effective in preventing/treating heartburn.  Your pain medication can cause sedation.  Really no other medications are contributing. If you feel that the duloxetine is contributing to the sleepiness rather than just the recovery from this significant infection, then you can start cutting back down on the dose --skip every 3rd day for 6 days, then back down to every other day for a week and then stop.  Be on the lookout for recurrent depressive symptoms and worsening neuropathy pain.  Try and get a little activity each day, as tolerated--even if only 5 minutes at a time.  Gradually increase as tolerated.  Let us know if you are, in fact, snoring loudly and having long pauses in breathing.   Continue antibiotics as directed. Continue with follow-up plans as scheduled for repeat  CT in 2 weeks.

## 2017-02-25 ENCOUNTER — Other Ambulatory Visit: Payer: Self-pay | Admitting: Hematology and Oncology

## 2017-02-25 ENCOUNTER — Ambulatory Visit
Admission: RE | Admit: 2017-02-25 | Discharge: 2017-02-25 | Disposition: A | Discharge status: Home / Self Care | Payer: Medicare Other | Source: Ambulatory Visit | Attending: Hematology and Oncology | Admitting: Hematology and Oncology

## 2017-02-25 ENCOUNTER — Inpatient Hospital Stay: Payer: Medicare Other | Admitting: Family Medicine

## 2017-02-25 ENCOUNTER — Encounter: Payer: Self-pay | Admitting: Family Medicine

## 2017-02-25 ENCOUNTER — Ambulatory Visit (INDEPENDENT_AMBULATORY_CARE_PROVIDER_SITE_OTHER): Payer: Medicare Other | Admitting: Family Medicine

## 2017-02-25 ENCOUNTER — Ambulatory Visit
Admission: RE | Admit: 2017-02-25 | Discharge: 2017-02-25 | Disposition: A | Discharge status: Home / Self Care | Payer: Medicare Other | Source: Ambulatory Visit | Attending: Radiology | Admitting: Radiology

## 2017-02-25 VITALS — BP 110/70 | HR 92 | Ht 66.0 in | Wt 186.2 lb

## 2017-02-25 DIAGNOSIS — Z5181 Encounter for therapeutic drug level monitoring: Secondary | ICD-10-CM

## 2017-02-25 DIAGNOSIS — F320 Major depressive disorder, single episode, mild: Secondary | ICD-10-CM

## 2017-02-25 DIAGNOSIS — R5383 Other fatigue: Secondary | ICD-10-CM

## 2017-02-25 DIAGNOSIS — N739 Female pelvic inflammatory disease, unspecified: Secondary | ICD-10-CM | POA: Diagnosis not present

## 2017-02-25 HISTORY — PX: IR RADIOLOGIST EVAL & MGMT: IMG5224

## 2017-02-25 MED ORDER — IOPAMIDOL (ISOVUE-300) INJECTION 61%
100.0000 mL | Freq: Once | INTRAVENOUS | Status: AC | PRN
  Administered 2017-02-25: 100 mL via INTRAVENOUS

## 2017-02-25 NOTE — Progress Notes (Signed)
Chief Complaint: F/u drain placement by Dr. Pascal Lux 02/18/2017  Referring Physician(s): Dr. Gerri Lins  Supervising Physician: Daryll Brod  History of Present Illness: Kayla Guerrero is a 82 y.o. female with past medical history of arthritis, GERD, DDD, and endometrial cancer s/p hysterectomy with BSO in 2017.  She who presented to [facility] with abdominal pain on 522/2018.   She has a history of large pelvic seroma which was aspirated by Dr. Laurence Ferrari 09/30/16.   CT Abd/Pelvis done 02/17/17 showed: 1. The previously noted thin walled cystic lesion in the right pelvis has undergone change in appearance ; it is now thick-walled and rim enhancing. There is associated inflammation and edema surrounding the pelvic sidewall lesion which has also increased in size. This suggests interim inflammation/infection of the cystic collection. 2. Urinary bladder displaced to the left by the thick-walled right pelvic sidewall cystic lesion. Wall thickening of the bladder is likely reactive but could also be due to a cystitis. Suggest correlation with urinalysis. 3. Stable intra and extrahepatic biliary dilatation post cholecystectomy 4. Stable 13 mm intermediate density right renal lesion 5. Diffuse diverticular disease of the colon without acute Inflammation  She underwent drain placment to treat the recurrent fluid collection by Dr. Pascal Lux on 02/19/2017.  She is seen in clinic for repeat CT scan and possible drain injection.  She reports about 30 mL of output per day in the suction bulb.  The drainage has a tan milky appearance  She tells me she has a home health nurse who flushes it daily.   Past Medical History:  Diagnosis Date  . Arthritis   . Arthritis of knee, left   . Borderline osteopenia DEXA 2006 and 2012  . Cancer [facility])    endometrial  . Cataract   . Cholesterol serum elevated   . Colon polyps   . DDD (degenerative disc disease), lumbar   . FHx: colon cancer   . GERD  (gastroesophageal reflux disease)   . History of radiation therapy 08/06/16 - 09/03/16   vaginal cuff treated to 30 Gy in 5 fractions  . Hypothyroidism   . Obesity     Past Surgical History:  Procedure Laterality Date  . CATARACT EXTRACTION, BILATERAL    . CHOLECYSTECTOMY    . COLONOSCOPY  2010, 04/2013   Buccini  . IR GENERIC HISTORICAL  08/11/2016   IR FLUORO GUIDE PORT INSERTION RIGHT 08/11/2016 Greggory Keen, MD [facility]INTER[facility]  . IR GENERIC HISTORICAL  08/11/2016   IR US GUIDE VASC ACCESS RIGHT 08/11/2016 Greggory Keen, MD [facility]INTER[facility]  . PTERYGIUM EXCISION    . ROBOTIC ASSISTED TOTAL HYSTERECTOMY WITH BILATERAL SALPINGO OOPHERECTOMY N/A 05/06/2016   Procedure: XI ROBOTIC ASSISTED  LAPAROSCOPIC TOTAL HYSTERECTOMY WITH BILATERAL SALPINGO OOPHORECTOMY;  Surgeon: Nancy Marus, MD;  Location: [facility] ORS;  Service: Gynecology;  Laterality: N/A;  . SENTINEL NODE BIOPSY N/A 05/06/2016   Procedure: SENTINEL NODE BIOPSY;  Surgeon: Nancy Marus, MD;  Location: [facility] ORS;  Service: Gynecology;  Laterality: N/A;  . TUBAL LIGATION      Allergies: Levaquin [levofloxacin hemihydrate]; Promethazine hcl; and Sulfa drugs cross reactors  Medications: Prior to Admission medications   Medication Sig Start Date End Date Taking? Authorizing Provider  Alpha-Lipoic Acid 600 MG CAPS Take 1 capsule by mouth daily.    [provider]  amoxicillin-clavulanate (AUGMENTIN) 875-125 MG tablet Take 1 tablet by mouth every 12 (twelve) hours. 02/20/17 03/22/17  Raiford Noble Latif, DO  b complex vitamins capsule Take 1 capsule by  mouth daily.    [provider]  Cholecalciferol (VITAMIN D) 2000 UNITS tablet Take 2,000 Units by mouth daily.    [provider]  DULoxetine (CYMBALTA) 30 MG capsule Take 1 capsule (30 mg total) by mouth daily. 02/16/17   Rita Ohara, MD  famotidine (PEPCID) 20 MG tablet Take 1 tablet (20 mg total) by mouth 2 (two) times daily. 02/20/17   Raiford Noble Latif, DO  ferrous  sulfate 325 (65 FE) MG tablet Take 1 tablet (325 mg total) by mouth 2 (two) times daily with a meal. 02/20/17   Sheikh, Georgina Quint Latif, DO  glucosamine-chondroitin 500-400 MG tablet Take 1 tablet by mouth 2 (two) times daily.     [provider]  HYDROcodone-acetaminophen (NORCO/VICODIN) 5-325 MG tablet Take 1 tablet by mouth every 6 (six) hours as needed for moderate pain. 02/20/17   Raiford Noble Latif, DO  levothyroxine (SYNTHROID, LEVOTHROID) 88 MCG tablet TAKE ONE TABLET BY MOUTH ONCE DAILY BEFORE  BREAKFAST. 01/06/17   Rita Ohara, MD  lidocaine-prilocaine (EMLA) cream  08/08/16   [provider]  Magnesium 400 MG CAPS Take 1 capsule by mouth 2 (two) times daily.     [provider]  MELATONIN PO Take 1 tablet by mouth at bedtime as needed (sleep).     [provider]  Multiple Vitamin (MULTI-VITAMIN) tablet Take 1 tablet by mouth daily.      [provider]  naproxen sodium (ANAPROX) 220 MG tablet Take 220 mg by mouth 2 (two) times daily as needed (pain).     [provider]  polyethylene glycol (MIRALAX / GLYCOLAX) packet Take 17 g by mouth 2 (two) times daily as needed for mild constipation.     [provider]  senna-docusate (SENOKOT S) 8.6-50 MG tablet Take 1 tablet by mouth 2 (two) times daily as needed for mild constipation.    [provider]  terbinafine (LAMISIL) 250 MG tablet Take 1 tablet (250 mg total) by mouth daily. Patient taking differently: Take 250 mg by mouth daily. Using every other day 12/23/16   Hyatt, Max T, DPM  Wheat Dextrin (BENEFIBER DRINK MIX PO) Take 1 scoop by mouth 2 (two) times daily.     [provider]     Family History  Problem Relation Age of Onset  . Cancer Mother 64       COLON   . Heart disease Mother   . Cancer Maternal Aunt   . Breast cancer Maternal Aunt   . Mental illness Maternal Uncle   . Cancer Maternal Grandmother        stomach  . Macular degeneration Maternal  Grandmother   . Diabetes Neg Hx     Social History   Social History  . Marital status: Married    Spouse name: Jeneen Rinks  . Number of children: 1  . Years of education: MFA   Occupational History  . retired Librarian, academic); Artist    Social History Main Topics  . Smoking status: Former Smoker    Packs/day: 1.00    Types: Cigarettes    Quit date: 09/29/1980  . Smokeless tobacco: Never Used  . Alcohol use 0.0 oz/week     Comment: 1 glass of wine 3-4 times/week; hard liquor (highball) or a beer once a week  . Drug use: No  . Sexual activity: Not on file   Other Topics Concern  . Not on file   Social History Narrative   Lives with husband.  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 Son lives in [place]; granddaughter lives in [place] (with her Verlee Rossetti is divorced)   Review of Systems  Vital Signs: There were no vitals taken for this visit.  Physical Exam Awake and Alert NAD Drain in good position ~10 mL milky drainage in the bulb  Imaging: Dg Chest 2 View  Result Date: 02/17/2017 CLINICAL DATA:  Shortness of breath for a couple of days with some dizziness. Former smoker. EXAM: CHEST  2 VIEW COMPARISON:  Chest CTA 07/29/2016 FINDINGS: Right jugular Port-A-Cath terminates over the lower SVC there is a small hiatal hernia. The cardiac silhouette is normal in size. Aortic atherosclerosis is noted. There is anterior eventration of the right hemidiaphragm. The lungs are clear. No pleural effusion or pneumothorax is identified. Thoracic spondylosis and chronic retrolisthesis of T12 on L1 are noted. IMPRESSION: No active cardiopulmonary disease. Electronically Signed   By: Logan Bores M.D.   On: 02/17/2017 15:21   Ct Abdomen Pelvis W Contrast  Result Date: 02/25/2017 CLINICAL DATA:  Right pelvic abscess, status post percutaneous drain 02/18/2017 EXAM: CT ABDOMEN AND PELVIS WITH  CONTRAST TECHNIQUE: Multidetector CT imaging of the abdomen and pelvis was performed using the standard protocol following bolus administration of intravenous contrast. CONTRAST:  149mL ISOVUE-300 IOPAMIDOL (ISOVUE-300) INJECTION 61% COMPARISON:  02/18/2017, 02/17/2017 FINDINGS: Lower chest: Moderate hiatal hernia. Normal heart size. No pericardial or pleural effusion. Atherosclerosis of the lower thoracic aorta. Clear lung bases. Hepatobiliary: Remote cholecystectomy. Stable diffuse biliary dilatation, suspect postcholecystectomy related. Small 10 mm hypodense subcapsular cyst in the anterior left hepatic lobe, image 29. Portal veins are patent. Pancreas: Unremarkable. No pancreatic ductal dilatation or surrounding inflammatory changes. Spleen: Normal in size without focal abnormality. Adrenals/Urinary Tract: Normal adrenal glands. No renal obstruction or hydronephrosis. Stable subcentimeter cortical hypodense renal cysts bilaterally. Stomach/Bowel: Negative for bowel obstruction, significant dilatation, ileus, or free air. Scattered colonic diverticulosis. No acute inflammatory process. Normal appendix. No ascites or new fluid collection. Right pelvic sidewall percutaneous abscess drain in stable position. Significant improvement in the bilobed abscess. Very small residual right pelvic sidewall collections measure 2.8 cm, image 70 and 3.7 cm, image 74. The small residual collections appear to communicate on the coronal reconstructions, image 51. Vascular/Lymphatic: Aortoiliac atherosclerosis evident. Negative for aneurysm. No retroperitoneal abnormality or hematoma. No adenopathy. Reproductive: Previous hysterectomy.  No adnexal mass. Other: No abdominal wall hernia or abnormality. No abdominopelvic ascites. Musculoskeletal: Bones are osteopenic. Degenerative changes of the spine. No acute osseous finding. IMPRESSION: Significant improvement in the bilobed right pelvic sidewall abscess following percutaneous  drain. Stable drain catheter position. Small residual abscess is noted, measurements as above. No new abscess. Otherwise stable CT of the abdomen pelvis with contrast. Electronically Signed   By: Jerilynn Mages.  Shick M.D.   On: 02/25/2017 13:09   Ct Abdomen Pelvis W Contrast  Result Date: 02/17/2017 CLINICAL DATA:  Right lower quadrant pain and weakness history of endometrial cancer EXAM: CT ABDOMEN AND PELVIS WITH CONTRAST TECHNIQUE: Multidetector CT imaging of the abdomen and pelvis was performed using the standard protocol following bolus administration of intravenous contrast. CONTRAST:  170mL ISOVUE-300 IOPAMIDOL (ISOVUE-300) INJECTION 61% COMPARISON:  11/19/2016, 09/23/2016 FINDINGS: Lower chest: Lung bases demonstrate no acute consolidation or pleural effusion. Heart size nonenlarged. Hepatobiliary: Stable subcentimeter hypodense lesions in the liver. Mild intra hepatic and extrahepatic biliary dilatation post cholecystectomy, also unchanged. Pancreas: Unremarkable. No pancreatic ductal dilatation or  surrounding inflammatory changes. Spleen: Normal in size without focal abnormality. Adrenals/Urinary Tract: Adrenal glands are unremarkable. Kidneys show no hydronephrosis. Intermediate density lesion measuring 14 mm posterior cortex of the mid right kidney. Bladder is displaced to the left and appears slightly thick walled. Stomach/Bowel: Stomach nonenlarged. No dilated small bowel. Diffuse diverticular disease of the colon without acute inflammation. Vascular/Lymphatic: Aortic atherosclerosis. No significantly enlarged abdominal or pelvic lymph nodes. Reproductive: Status post hysterectomy. Other: No free air. No significant free fluid. Fat in the umbilicus. A previously stable thin walled cystic lesion in the right pelvis now demonstrates thick wall enhancement and has increased in size, now measuring 7.5 x 5.8 x 7.1 cm. There is edema and stranding within the fat surrounding the mass. Adjacent soft tissue thickening  along the right pelvic sidewall. Mass effect on the bladder which is also slightly thick walled. Musculoskeletal: Degenerative changes at multiple levels. No acute or suspicious bone lesion. IMPRESSION: 1. The previously noted thin walled cystic lesion in the right pelvis has undergone change in appearance ; it is now thick-walled and rim enhancing. There is associated inflammation and edema surrounding the pelvic sidewall lesion which has also increased in size. This suggests interim inflammation/infection of the cystic collection. 2. Urinary bladder displaced to the left by the thick-walled right pelvic sidewall cystic lesion. Wall thickening of the bladder is likely reactive but could also be due to a cystitis. Suggest correlation with urinalysis. 3. Stable intra and extrahepatic biliary dilatation post cholecystectomy 4. Stable 13 mm intermediate density right renal lesion 5. Diffuse diverticular disease of the colon without acute inflammation Electronically Signed   By: Donavan Foil M.D.   On: 02/17/2017 18:08   Ct Image Guided Drainage By Percutaneous Catheter  Result Date: 02/18/2017 INDICATION: Remote history of gynecologic malignancy, post surgical resection with persistent right pelvic fluid collection. Patient underwent CT-guided aspiration of this fluid collection on 09/30/2016 with cytologic analysis negative for evidence of malignancy. Unfortunately, patient returned to the emergency department yesterday with abdominal pain with CT scan demonstrating findings for superimposed infection of this recurrent/residual right pelvis sidewall collection. Request made for CT-guided percutaneous drainage catheter placement for infection source control. EXAM: CT IMAGE GUIDED DRAINAGE BY PERCUTANEOUS CATHETER COMPARISON:  CT abdomen pelvis - 02/17/2017; 11/19/2016; 09/23/2016; 05/16/2016 ; CT-guided right pelvic fluid collection aspiration -09/30/2016 MEDICATIONS: The patient is currently admitted to the  hospital and receiving intravenous antibiotics. The antibiotics were administered within an appropriate time frame prior to the initiation of the procedure. ANESTHESIA/SEDATION: Moderate (conscious) sedation was employed during this procedure. A total of Versed 2 mg and Fentanyl 100 mcg was administered intravenously. Moderate Sedation Time: 17 minutes. The patient's level of consciousness and vital signs were monitored continuously by radiology nursing throughout the procedure under my direct supervision. CONTRAST:  None COMPLICATIONS: None immediate. PROCEDURE: Informed written consent was obtained from the patient after a discussion of the risks, benefits and alternatives to treatment. The patient was placed supine on the CT gantry and a pre procedural CT was performed re-demonstrating the known abscess/fluid collection within the right hemipelvis with dominant component measuring approximately 5.8 x 7.8 cm (image 25, series 2). The procedure was planned. A timeout was performed prior to the initiation of the procedure. The skin overlying the ventral aspect of the right lower abdomen/ pelvis was prepped and draped in the usual sterile fashion. The overlying soft tissues were anesthetized with 1% lidocaine with epinephrine. Appropriate trajectory was planned with the use of a 22 gauge spinal needle.  An 18 gauge trocar needle was advanced into the abscess/fluid collection and a short Amplatz super stiff wire was coiled within the collection. Appropriate positioning was confirmed with a limited CT scan. The tract was serially dilated allowing placement of a 10 [place] all-purpose drainage catheter. Appropriate positioning was confirmed with a limited postprocedural CT scan. Approximately 50 mL of purulent fluid was aspirated. The tube was connected to a JP bulb and sutured in place. A dressing was placed. The patient tolerated the procedure well without immediate post procedural complication. IMPRESSION: Successful  CT guided placement of a 10 French all purpose drain catheter into the residual/recurrent right pelvic sidewall fluid collection with aspiration of 50 mL of purulent fluid. Samples were sent to the laboratory for both culture and cytologic analysis. Electronically Signed   By: Sandi Mariscal M.D.   On: 02/18/2017 16:44    Labs:  CBC:  Recent Labs  02/17/17 1527 02/18/17 0628 02/19/17 0523 02/20/17 0433  WBC 16.4* 17.2* 10.1 7.6  HGB 11.0* 10.1* 9.2* 9.5*  HCT 32.2* 30.0* 27.6* 28.0*  PLT 507* 477* 471* 550*    COAGS:  Recent Labs  08/11/16 1256 09/30/16 1021 02/18/17 0940  INR 0.94 1.00 1.26  APTT  --  25 35    BMP:  Recent Labs  02/17/17 1527 02/18/17 0628 02/19/17 0523 02/20/17 0433  NA 132* 134* 136 138  K 3.7 3.7 3.8 3.6  CL 95* 101 102 102  CO2 25 25 27 28   GLUCOSE 106* 108* 101* 97  BUN 10 9 8 7   CALCIUM 8.1* 8.0* 7.8* 8.0*  CREATININE 0.82 0.80 0.75 0.72  GFRNONAA >60 >60 >60 >60  GFRAA >60 >60 >60 >60    LIVER FUNCTION TESTS:  Recent Labs  10/09/16 0935 02/17/17 1527 02/19/17 0523 02/20/17 0433  BILITOT 0.24 0.5 0.3 0.3  AST 16 17 14* 16  ALT <6 10* 9* 9*  ALKPHOS 73 83 66 70  PROT 6.7 6.8 5.5* 5.8*  ALBUMIN 3.3* 2.5* 1.9* 2.0*    TUMOR MARKERS: No results for input(s): AFPTM, CEA, CA199, CHROMGRNA in the last 8760 hours.  Assessment:  S/P CT guided drain placement by Dr. Pascal Lux on 02/18/2017.  CT reviewed by Dr. Annamaria Boots.  CT shows some fluid collection still remaining.   Continue antibiotics and routine flushes  Continue to record output  No need for injection today.  Return in 2 weeks with repeat CT scan and will schedule with injection at that time in case it is needed.   Electronically Signed: Murrell Redden PA-C 02/25/2017, 3:41 PM   Please refer to Dr. Fritz Pickerel attestation of this note for management and plan.

## 2017-02-25 NOTE — Patient Instructions (Signed)
  Try changing the timing of the Pepcid to taking it before meals, to see if it is more effective in preventing/treating heartburn.  Your pain medication can cause sedation.  Really no other medications are contributing. If you feel that the duloxetine is contributing to the sleepiness rather than just the recovery from this significant infection, then you can start cutting back down on the dose --skip every 3rd day for 6 days, then back down to every other day for a week and then stop.  Be on the lookout for recurrent depressive symptoms and worsening neuropathy pain.  Try and get a little activity each day, as tolerated--even if only 5 minutes at a time.  Gradually increase as tolerated.  Let us know if you are, in fact, snoring loudly and having long pauses in breathing.   Continue antibiotics as directed. Continue with follow-up plans as scheduled for repeat CT in 2 weeks.

## 2017-02-26 ENCOUNTER — Telehealth: Payer: Self-pay | Admitting: *Deleted

## 2017-02-26 ENCOUNTER — Telehealth: Payer: Self-pay | Admitting: Family Medicine

## 2017-02-26 ENCOUNTER — Encounter: Payer: Self-pay | Admitting: Family Medicine

## 2017-02-26 LAB — CBC WITH DIFFERENTIAL/PLATELET
BASOS PCT: 0 %
Basophils Absolute: 0 cells/uL (ref 0–200)
EOS ABS: 68 {cells}/uL (ref 15–500)
Eosinophils Relative: 1 %
HEMATOCRIT: 36.1 % (ref 35.0–45.0)
Hemoglobin: 11.6 g/dL — ABNORMAL LOW (ref 11.7–15.5)
LYMPHS PCT: 20 %
Lymphs Abs: 1360 cells/uL (ref 850–3900)
MCH: 28.6 pg (ref 27.0–33.0)
MCHC: 32.1 g/dL (ref 32.0–36.0)
MCV: 89.1 fL (ref 80.0–100.0)
MONO ABS: 408 {cells}/uL (ref 200–950)
MPV: 9.5 fL (ref 7.5–12.5)
Monocytes Relative: 6 %
NEUTROS ABS: 4964 {cells}/uL (ref 1500–7800)
Neutrophils Relative %: 73 %
Platelets: 715 10*3/uL — ABNORMAL HIGH (ref 140–400)
RBC: 4.05 MIL/uL (ref 3.80–5.10)
RDW: 14.3 % (ref 11.0–15.0)
WBC: 6.8 10*3/uL (ref 4.0–10.5)

## 2017-02-26 LAB — COMPREHENSIVE METABOLIC PANEL
ALBUMIN: 3 g/dL — AB (ref 3.6–5.1)
ALT: 11 U/L (ref 6–29)
AST: 20 U/L (ref 10–35)
Alkaline Phosphatase: 72 U/L (ref 33–130)
BUN: 13 mg/dL (ref 7–25)
CHLORIDE: 100 mmol/L (ref 98–110)
CO2: 24 mmol/L (ref 20–31)
CREATININE: 0.94 mg/dL — AB (ref 0.60–0.88)
Calcium: 8.3 mg/dL — ABNORMAL LOW (ref 8.6–10.4)
GLUCOSE: 95 mg/dL (ref 65–99)
Potassium: 4.5 mmol/L (ref 3.5–5.3)
SODIUM: 137 mmol/L (ref 135–146)
Total Bilirubin: 0.2 mg/dL (ref 0.2–1.2)
Total Protein: 6.3 g/dL (ref 6.1–8.1)

## 2017-02-26 LAB — PHOSPHORUS: Phosphorus: 3.7 mg/dL (ref 2.1–4.3)

## 2017-02-26 LAB — MAGNESIUM: Magnesium: 2.3 mg/dL (ref 1.5–2.5)

## 2017-02-26 NOTE — Telephone Encounter (Signed)
Per staff message from DR. Alvy Bimler, appt made for the patient to see a doctor. Patient notified of the appt with Dr. Skeet Latch on June 7th at [place]; arrive at 9:45am.

## 2017-02-26 NOTE — Telephone Encounter (Signed)
Betsy from [facility] called and needs a verbal for Physical Therapy for 1 time a week for 1 week and then 2 times a week for 4 weeks.  [phone]

## 2017-02-26 NOTE — Telephone Encounter (Signed)
Called Betsy back and gave her verbal orders.

## 2017-02-26 NOTE — Telephone Encounter (Signed)
ok 

## 2017-02-27 LAB — AEROBIC/ANAEROBIC CULTURE W GRAM STAIN (SURGICAL/DEEP WOUND): Special Requests: NORMAL

## 2017-03-02 ENCOUNTER — Telehealth: Payer: Self-pay | Admitting: *Deleted

## 2017-03-02 NOTE — Telephone Encounter (Signed)
Patient advised.

## 2017-03-02 NOTE — Telephone Encounter (Signed)
Kayla Guerrero called to report fainting episode this past Saturday. Patient reports she was feeling fine and went to bathroom, when she came out she was standing in the doorway and went down-husband caught her so she never actually hit the floor. No injuries noted, no nausea-she is not exactly sure what happened. Felling ok now.

## 2017-03-02 NOTE — Telephone Encounter (Signed)
They are normal.  I sent results through [place] (and a separate message)

## 2017-03-02 NOTE — Telephone Encounter (Signed)
Patient called back inquiring about results from cytology that we waiting on from last week.

## 2017-03-02 NOTE — Telephone Encounter (Signed)
Patient advised. Husband took bp 45 after fall and it was 125/67.

## 2017-03-02 NOTE — Telephone Encounter (Signed)
Was her blood pressure checked?  Pulse?  Have her drink plenty of fluids, and take things very slowly.  If ongoing issues with dizziness, have her return for eval.

## 2017-03-03 ENCOUNTER — Other Ambulatory Visit: Payer: Medicare Other

## 2017-03-05 ENCOUNTER — Ambulatory Visit: Payer: Medicare Other | Attending: Gynecologic Oncology | Admitting: Gynecologic Oncology

## 2017-03-05 ENCOUNTER — Encounter: Payer: Self-pay | Admitting: Gynecologic Oncology

## 2017-03-05 VITALS — BP 142/82 | HR 87 | Temp 98.6°F | Resp 20 | Wt 183.4 lb

## 2017-03-05 DIAGNOSIS — C541 Malignant neoplasm of endometrium: Secondary | ICD-10-CM | POA: Insufficient documentation

## 2017-03-05 DIAGNOSIS — Z86010 Personal history of colonic polyps: Secondary | ICD-10-CM | POA: Insufficient documentation

## 2017-03-05 DIAGNOSIS — B54 Unspecified malaria: Secondary | ICD-10-CM

## 2017-03-05 DIAGNOSIS — M5136 Other intervertebral disc degeneration, lumbar region: Secondary | ICD-10-CM | POA: Diagnosis not present

## 2017-03-05 DIAGNOSIS — Z9221 Personal history of antineoplastic chemotherapy: Secondary | ICD-10-CM | POA: Diagnosis not present

## 2017-03-05 DIAGNOSIS — E039 Hypothyroidism, unspecified: Secondary | ICD-10-CM | POA: Diagnosis not present

## 2017-03-05 DIAGNOSIS — K651 Peritoneal abscess: Secondary | ICD-10-CM

## 2017-03-05 DIAGNOSIS — K219 Gastro-esophageal reflux disease without esophagitis: Secondary | ICD-10-CM | POA: Insufficient documentation

## 2017-03-05 DIAGNOSIS — N289 Disorder of kidney and ureter, unspecified: Secondary | ICD-10-CM

## 2017-03-05 DIAGNOSIS — M8580 Other specified disorders of bone density and structure, unspecified site: Secondary | ICD-10-CM | POA: Diagnosis not present

## 2017-03-05 DIAGNOSIS — R188 Other ascites: Secondary | ICD-10-CM

## 2017-03-05 DIAGNOSIS — N739 Female pelvic inflammatory disease, unspecified: Secondary | ICD-10-CM | POA: Diagnosis present

## 2017-03-05 DIAGNOSIS — E669 Obesity, unspecified: Secondary | ICD-10-CM | POA: Insufficient documentation

## 2017-03-05 DIAGNOSIS — Z87891 Personal history of nicotine dependence: Secondary | ICD-10-CM | POA: Insufficient documentation

## 2017-03-05 NOTE — Patient Instructions (Addendum)
Follow up with Dr. Alycia Rossetti and Dr. Sondra Come as scheduled.  Please call for any questions or concerns.  We will also place a referral for you to meet with a urologist for evaluation of the renal lesion seen on CT.

## 2017-03-05 NOTE — Progress Notes (Addendum)
GYN ONCOLOGY OFFICE VISIT  Referring physician: Dr. Lenise Herald Clos 82 y.o. female   CC:  Chief Complaint  Patient presents with  . Endometrial Cancer  Pelvic abscess  Assessment/Plan:  Kayla Guerrero  is a 82 y.o.  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 Her postoperative case was complicated by a  lymphocyst that has been previously drained and was negative for malignancy. She was admitted for 4 days in May 2018  with gastroenteritis and resulting sepsis and infection of the lymphocyst. A drain was placed, and antibiotic therapy with Augmentin has been effective. Approximately 20 mL of purulent material is presen ttoday  in the collecting bulb.  Follow-up with interventional radiology on 03/11/2017 for assessment is previously scheduled. Follow-up with Dr. Ned Clines in September 2018 Follow-up with Dr. Sondra Come December 2018  Right renal lesion Identified on imaging 08/2016 as subcentimeter.  Now right lesion is 4mm Referred to urology for assessment.   HPI: Kayla Guerrero is an extremely pleasant 82 year old G2P2 who was seen in consultation at the request of Dr Nori Riis for high grade (serous) endometrial cancer.   The patient reports postmenopausal spotting since July, 2017. She was seen by Dr Nori Riis for this who performed a TVUS on 04/08/16 which revealed a 7.3x4x5.2cm uterus with a 3.3cm irregular mass within the endometrium. The ovaries were normal.   A hysteroscopy and D&C was performed on 04/09/16 with several polypoid structures seen and removed. 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    Oncology History   IB USC, negative nodes.     Malignant neoplasm of endometrium ([place])   04/11/2016 Pathology Results    Endometrium, curettage - HIGH GRADE ENDOMETRIAL CARCINOMA, SEE COMMENT. Microscopic Comment The overall appearance favors  serous carcinoma.       04/23/2016 Initial Diagnosis    Patient presented to PCP with new vaginal discharge and some vaginal spotting, referred to Dr Dory Horn, whom she had known previously. D&C on 04-09-16 had high grade carcinoma favoring serous histology [phone]). She was referred to gyn oncology, saw Dr Denman George on 04-23-16.       04/30/2016 Imaging    Markedly thickened/widened endometrium consistent with known endometrial cancer. No evidence of serosal or extra uterine extension. 2. No findings to suggest metastatic disease involving the chest, abdomen or pelvis. 3. Indeterminant 12.5 mm right renal lesion, small enhancing mass versus hemorrhagic cyst. Attention on future scans is suggested. 4. Atherosclerotic calcifications involving the thoracic and abdominal aorta and branch vessels but no focal aneurysm. 5. Moderate stool throughout the colon and down into the rectum may suggest constipation.       05/06/2016 Surgery    Robotic hysterectomy and staging. IB USC, 0/11 nodes. Dispositioned to chemotherapy with paclitaxel and carboplatin x 6 with vaginal brachytherapy.      05/06/2016 Pathology Results    1. Lymph node, biopsy, right peri-aortic - ONE OF ONE LYMPH NODES NEGATIVE FOR MALIGNANCY (0/1). 2. Lymph node, biopsy, left peri-aortic - ONE OF ONE LYMPH NODES NEGATIVE FOR MALIGNANCY (0/1). 3. Lymph node, biopsy, right pelvic - FOUR OF FOUR LYMPH NODES NEGATIVE FOR MALIGNANCY (0/4). 4. Lymph node, biopsy, left pelvic - FIVE OF FIVE LYMPH NODES NEGATIVE FOR MALIGNANCY (0/5). 5. Uterus +/- tubes/ovaries, neoplastic - UTERUS: -ENDO/MYOMETRIUM: INVASIVE MIXED ENDOMETRIOID AND SEROUS CARCINOMA, SPANNING 4 CM. TUMOR INVADES OUTER HALF OF MYOMETRIUM. LYMPHOVASCULAR INVASION PRESENT.  SEE ONCOLOGY TABLE. LEIOMYOMA. -SEROSA: UNINVOLVED. NO MALIGNANCY. - CERVIX: BENIGN SQUAMOUS AND ENDOCERVICAL MUCOSA. NO DYSPLASIA OR MALIGNANCY. - BILATERAL OVARIES: INCLUSION CYSTS. NO MALIGNANCY. -  BILATERAL FALLOPIAN TUBES: UNREMARKABLE. NO MALIGNANCY.  Specimen: Uterus, cervix, bilateral ovaries and fallopian tubes, bilateral pelvic and para-aortic lymph nodes. Procedure: Hysterectomy with bilateral salpingo-oophorectomy. Lymph node sampling performed: Bilateral pelvic and para-aortic lymph node biopsies Specimen integrity: Intact. 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      05/16/2016 Imaging    Ct abdomen: Extensive subcutaneous emphysema about the abdomen and pelvis, likely postoperative. Probable abdominal pelvic wall small volume hematomas, as above. 2. Right pelvic sidewall fluid collection is likely a seroma or lymphangioma. No explanation for left lower extremity pain. 3. Minimal ill-defined fluid in the presacral space and left adnexa. 4. Small hiatal hernia. 5. An incidental finding of potential clinical significance has been found. Indeterminate right renal lesion is similar to on the recent exam. Consider further evaluation with dedicated outpatient pre and post contrast abdominal MRI      06/11/2016 - 08/22/2016 Chemotherapy    The patient completed only three cycles due to toxicity from paclitaxel and carboplatin.       07/29/2016 Imaging    CT angiogram chest: No demonstrable pulmonary embolus. Multiple foci of atherosclerotic calcification in the aorta as well as foci of coronary artery calcification.  No edema or consolidation. No lung mass or nodule lesion. No adenopathy. Gallbladder absent.  Stable mild biliary duct prominence. Stable nodular opacity right lobe of thyroid which does not meet consensus guidelines criteria for further assessment      08/06/2016 - 09/03/2016 Radiation Therapy    HDR vaginal cuff brachytherapy x 5 fractions.      08/11/2016 Procedure    Ultrasound and fluoroscopically guided right internal jugular single lumen power port catheter insertion. Tip in the SVC/RA junction. Catheter  ready for use.      09/23/2016 Imaging    Ct abdomen: No findings suspicious for metastatic disease in the abdomen or pelvis. 2. Thin-walled lobulated 6.9 x 5.8 x 5.8 cm right pelvic sidewall fluid collection, increased in size since 05/16/2016 CT study, favor a postoperative lymphocele, which demonstrates extrinsic mass-effect on the right bladder wall. 3. Indeterminate 1.2 cm posterior interpolar right renal cortical lesion, for which 4 month stability has been demonstrated, renal neoplasm not excluded. Recommend either dedicated renal protocol MRI or CT abdomen without and with IV contrast or continued attention on follow-up surveillance CT studies, as clinically warranted. 4. Additional findings include aortic atherosclerosis, moderate hiatal hernia, moderate sigmoid diverticulosis, tiny fat containing umbilical hernia, and degenerative disc disease, facet arthropathy and spondylolisthesis in the lower lumbar spine      09/30/2016 Procedure    She had successful CT-guided aspiration of right pelvic fluid collection. Approximately 90 mL yellow serous fluid was aspirated. Samples were sent for culture and cytology.      11/19/2016 Imaging    CT abdomen: No CT findings to suggest recurrent tumor, lymphadenopathy or metastatic disease. 2. Stable right-sided pelvic sidewall cyst with mass effect on the bladder. 3. Stable advanced atherosclerotic calcifications involving the aorta and iliac arteries. 4. Moderate size hiatal hernia. 5. Status post cholecystectomy with stable intra and extrahepatic biliary dilatation      12/29/2016 Pathology Results    SOFT TISSUE, FINE NEEDLE ASPIRATION, RIGHT ADNEXAL CYST (SPECIMEN 1 OF 1 COLLECTED 09-30-2016) NO MALIGNANT CELLS IDENTIFIED.      02/25/2017 Imaging  Significant improvement in the bilobed right pelvic sidewall abscess following percutaneous drain. Stable drain catheter position. Small residual abscess is noted, measurements as above. No new abscess.  Otherwise stable CT of the abdomen pelvis with contrast.      Interval History:  Aeliana Spates Slemmer was admitted from 02/17/2017 until 02/20/2017 with gastroenteritis and associated sepsis. Imaging of the abdomen and pelvis identified the presence of a an abscess in the right pelvis at the site of the prior lymphocyst a drain was placed antibiotic coverage extended with resolution of leukocytosis. The drain is still currently in place and draining purulent material. Final cultures were notable for gram-positive cocci in pairs. Culture grew out Viridans Streptococcus.   Review of Systems  Constitutional: Denies fever. Feels drained and fatigued Skin: No rash Cardiovascular: No chest pain, shortness of breath, or edema  Gastro Intestinal:  No nausea, vomiting, constipation, or diarrhea reported.  Genitourinary: Denies vaginal bleeding and discharge.  Psych:  Flat mood,  Msk: No change in gait, no weakness  Current Meds:  Outpatient Encounter Prescriptions as of 03/05/2017  Medication Sig  . Alpha-Lipoic Acid 600 MG CAPS Take 1 capsule by mouth daily.  [name] amoxicillin-clavulanate (AUGMENTIN) 875-125 MG tablet Take 1 tablet by mouth every 12 (twelve) hours.  [name] b complex vitamins capsule Take 1 capsule by mouth daily.  . Cholecalciferol (VITAMIN D) 2000 UNITS tablet Take 2,000 Units by mouth daily.  . DULoxetine (CYMBALTA) 30 MG capsule Take 1 capsule (30 mg total) by mouth daily.  . famotidine (PEPCID) 20 MG tablet Take 1 tablet (20 mg total) by mouth 2 (two) times daily.  . ferrous sulfate 325 (65 FE) MG tablet Take 1 tablet (325 mg total) by mouth 2 (two) times daily with a meal.  . glucosamine-chondroitin 500-400 MG tablet Take 1 tablet by mouth 2 (two) times daily.   [name] HYDROcodone-acetaminophen (NORCO/VICODIN) 5-325 MG tablet Take 1 tablet by mouth every 6 (six) hours as needed for moderate pain.  [name] levothyroxine (SYNTHROID, LEVOTHROID) 88 MCG tablet TAKE ONE TABLET BY MOUTH ONCE DAILY BEFORE   BREAKFAST.  [name] lidocaine-prilocaine (EMLA) cream   . Magnesium 400 MG CAPS Take 1 capsule by mouth 2 (two) times daily.   . Multiple Vitamin (MULTI-VITAMIN) tablet Take 1 tablet by mouth daily.    . naproxen sodium (ANAPROX) 220 MG tablet Take 220 mg by mouth 2 (two) times daily as needed (pain).   . polyethylene glycol (MIRALAX / GLYCOLAX) packet Take 17 g by mouth 2 (two) times daily as needed for mild constipation.   . senna-docusate (SENOKOT S) 8.6-50 MG tablet Take 1 tablet by mouth 2 (two) times daily as needed for mild constipation.  . terbinafine (LAMISIL) 250 MG tablet Take 1 tablet (250 mg total) by mouth daily.  . Wheat Dextrin (BENEFIBER DRINK MIX PO) Take 1 scoop by mouth 2 (two) times daily.    Facility-Administered Encounter Medications as of 03/05/2017  Medication  . influenza  inactive virus vaccine (FLUZONE/FLUARIX) injection 0.5 mL    Allergy:  Allergies  Allergen Reactions  . Levaquin [Levofloxacin Hemihydrate] Other (See Comments)  . Promethazine Hcl Other (See Comments)    fainting  . Sulfa Drugs Cross Reactors Swelling    Social Hx:   Social History   Social History  . Marital status: Married    Spouse name: Jeneen Rinks  . Number of children: 1  . Years of education: MFA   Occupational History  . retired Librarian, academic); Artist    Social History  Main Topics  . Smoking status: Former Smoker    Packs/day: 1.00    Types: Cigarettes    Quit date: 09/29/1980  . Smokeless tobacco: Never Used  . Alcohol use 0.0 oz/week     Comment: 1 glass of wine 3-4 times/week; hard liquor (highball) or a beer once a week  . Drug use: No  . Sexual activity: Not on file   Other Topics Concern  . Not on file   Social History Narrative   Lives with husband.  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 Son lives in [place]; granddaughter lives in [place] (with her mother--son is  divorced)    Past Surgical Hx:  Past Surgical History:  Procedure Laterality Date  . CATARACT EXTRACTION, BILATERAL    . CHOLECYSTECTOMY    . COLONOSCOPY  2010, 04/2013   Buccini  . IR GENERIC HISTORICAL  08/11/2016   IR FLUORO GUIDE PORT INSERTION RIGHT 08/11/2016 Greggory Keen, MD [facility]  . IR GENERIC HISTORICAL  08/11/2016   IR US GUIDE VASC ACCESS RIGHT 08/11/2016 Greggory Keen, MD [facility]  . PTERYGIUM EXCISION    . ROBOTIC ASSISTED TOTAL HYSTERECTOMY WITH BILATERAL SALPINGO OOPHERECTOMY N/A 05/06/2016   Procedure: XI ROBOTIC ASSISTED  LAPAROSCOPIC TOTAL HYSTERECTOMY WITH BILATERAL SALPINGO OOPHORECTOMY;  Surgeon: Nancy Marus, MD;  Location: [facility];  Service: Gynecology;  Laterality: N/A;  . SENTINEL NODE BIOPSY N/A 05/06/2016   Procedure: SENTINEL NODE BIOPSY;  Surgeon: Nancy Marus, MD;  Location: [facility];  Service: Gynecology;  Laterality: N/A;  . TUBAL LIGATION      Past Medical Hx:  Past Medical History:  Diagnosis Date  . Arthritis   . Arthritis of knee, left   . Borderline osteopenia DEXA 2006 and 2012  . Cancer [facility])    endometrial  . Cataract   . Cholesterol serum elevated   . Colon polyps   . DDD (degenerative disc disease), lumbar   . FHx: colon cancer   . GERD (gastroesophageal reflux disease)   . History of radiation therapy 08/06/16 - 09/03/16   vaginal cuff treated to 30 Gy in 5 fractions  . Hypothyroidism   . Obesity     Past Gynecological History:  Tubal ligation, SVD x 2. No LMP recorded. Patient is postmenopausal.  Family Hx:  Family History  Problem Relation Age of Onset  . Cancer Mother 69       COLON   . Heart disease Mother   . Cancer Maternal Aunt   . Breast cancer Maternal Aunt   . Mental illness Maternal Uncle   . Cancer Maternal Grandmother        stomach  . Macular degeneration Maternal Grandmother   . Diabetes Neg Hx     Vitals:  Blood pressure (!) 142/82, pulse 87, temperature 98.6 F (37 C), temperature source Oral,  resp. rate 20, weight 183 lb 6.4 oz (83.2 kg).  Physical Exam: Well-nourished well-developed female in no acute distress. Neck: Supple, no lymphadenopathy, no thyromegaly. Lungs: Clear to auscultation bilaterally. Heart: Regular rate and rhythm Abdomen: Well-healed surgical incisions. Abdomen is soft, nontender, nondistended. There is no appreciable incisional hernias. Drain in place with return of purulent fluid.  Tubing was striped. Extremities: 1+ BLE edema Pelvic: External genitalia within normal limits though atrophic. The vagina is markedly atrophic. The vaginal cuff is without  lesions or discharge. There are no visible lesions. Bimanual examination  reveals no thickening and nodularity or masses. There is no tenderness or fluctuance. Rectal: Deferred Back: No CVA tenderness Lymph node assessment: No cervical supraclavicular or inguinal adenopathy

## 2017-03-09 ENCOUNTER — Ambulatory Visit: Payer: Medicare Other | Admitting: Radiation Oncology

## 2017-03-11 ENCOUNTER — Ambulatory Visit
Admission: RE | Admit: 2017-03-11 | Discharge: 2017-03-11 | Disposition: A | Discharge status: Home / Self Care | Payer: Medicare Other | Source: Ambulatory Visit | Attending: Hematology and Oncology | Admitting: Hematology and Oncology

## 2017-03-11 DIAGNOSIS — N739 Female pelvic inflammatory disease, unspecified: Secondary | ICD-10-CM

## 2017-03-11 HISTORY — PX: IR RADIOLOGIST EVAL & MGMT: IMG5224

## 2017-03-11 MED ORDER — IOPAMIDOL (ISOVUE-300) INJECTION 61%
100.0000 mL | Freq: Once | INTRAVENOUS | Status: AC | PRN
  Administered 2017-03-11: 100 mL via INTRAVENOUS

## 2017-03-11 NOTE — Progress Notes (Signed)
Referring Physician(s): Gorsuch,Ni  Chief Complaint: The patient is seen in follow up today s/p  02/18/17: Successful CT guided placement of a 10 French all purpose drain catheter into the residual/recurrent right pelvic sidewall fluid collection  History of present illness:  Pt follows with Dr Guy Sandifer Hx stage IB uterine serous carcinoma Post definitive surgery 04/2016 Has had recurrent pelvic fluid collection Drain placement 02/18/2017 in IR Output has diminished to almost none for few days Under 10 cc for several days. Denies pain; fever; chills Still on Augmentin for another 10 days To see Dr Alvy Bimler nest week  CT today to evaluate collection and possible drain removal  Past Medical History:  Diagnosis Date  . Arthritis   . Arthritis of knee, left   . Borderline osteopenia DEXA 2006 and 2012  . Cancer [facility])    endometrial  . Cataract   . Cholesterol serum elevated   . Colon polyps   . DDD (degenerative disc disease), lumbar   . FHx: colon cancer   . GERD (gastroesophageal reflux disease)   . History of radiation therapy 08/06/16 - 09/03/16   vaginal cuff treated to 30 Gy in 5 fractions  . Hypothyroidism   . Obesity     Past Surgical History:  Procedure Laterality Date  . CATARACT EXTRACTION, BILATERAL    . CHOLECYSTECTOMY    . COLONOSCOPY  2010, 04/2013   Buccini  . IR GENERIC HISTORICAL  08/11/2016   IR FLUORO GUIDE PORT INSERTION RIGHT 08/11/2016 Greggory Keen, MD [facility]  . IR GENERIC HISTORICAL  08/11/2016   IR US GUIDE VASC ACCESS RIGHT 08/11/2016 Greggory Keen, MD [facility]  . PTERYGIUM EXCISION    . ROBOTIC ASSISTED TOTAL HYSTERECTOMY WITH BILATERAL SALPINGO OOPHERECTOMY N/A 05/06/2016   Procedure: XI ROBOTIC ASSISTED  LAPAROSCOPIC TOTAL HYSTERECTOMY WITH BILATERAL SALPINGO OOPHORECTOMY;  Surgeon: Nancy Marus, MD;  Location: [facility];  Service: Gynecology;  Laterality: N/A;  . SENTINEL NODE BIOPSY N/A 05/06/2016   Procedure: SENTINEL NODE  BIOPSY;  Surgeon: Nancy Marus, MD;  Location: [facility];  Service: Gynecology;  Laterality: N/A;  . TUBAL LIGATION      Allergies: Levaquin [levofloxacin hemihydrate]; Promethazine hcl; and Sulfa drugs cross reactors  Medications: Prior to Admission medications   Medication Sig Start Date End Date Taking? Authorizing Provider  Alpha-Lipoic Acid 600 MG CAPS Take 1 capsule by mouth daily.    [provider]  amoxicillin-clavulanate (AUGMENTIN) 875-125 MG tablet Take 1 tablet by mouth every 12 (twelve) hours. 02/20/17 03/22/17  Raiford Noble Latif, DO  b complex vitamins capsule Take 1 capsule by mouth daily.    [provider]  Cholecalciferol (VITAMIN D) 2000 UNITS tablet Take 2,000 Units by mouth daily.    [provider]  DULoxetine (CYMBALTA) 30 MG capsule Take 1 capsule (30 mg total) by mouth daily. 02/16/17   Rita Ohara, MD  famotidine (PEPCID) 20 MG tablet Take 1 tablet (20 mg total) by mouth 2 (two) times daily. 02/20/17   Raiford Noble Latif, DO  ferrous sulfate 325 (65 FE) MG tablet Take 1 tablet (325 mg total) by mouth 2 (two) times daily with a meal. 02/20/17   Sheikh, Georgina Quint Latif, DO  glucosamine-chondroitin 500-400 MG tablet Take 1 tablet by mouth 2 (two) times daily.     [provider]  HYDROcodone-acetaminophen (NORCO/VICODIN) 5-325 MG tablet Take 1 tablet by mouth every 6 (six) hours as needed for moderate pain. 02/20/17   Raiford Noble Latif, DO  levothyroxine (SYNTHROID,  LEVOTHROID) 88 MCG tablet TAKE ONE TABLET BY MOUTH ONCE DAILY BEFORE  BREAKFAST. 01/06/17   Rita Ohara, MD  lidocaine-prilocaine (EMLA) cream  08/08/16   [provider]  Magnesium 400 MG CAPS Take 1 capsule by mouth 2 (two) times daily.     [provider]  Multiple Vitamin (MULTI-VITAMIN) tablet Take 1 tablet by mouth daily.      [provider]  naproxen sodium (ANAPROX) 220 MG tablet Take 220 mg by mouth 2 (two) times daily as needed (pain).      [provider]  polyethylene glycol (MIRALAX / GLYCOLAX) packet Take 17 g by mouth 2 (two) times daily as needed for mild constipation.     [provider]  senna-docusate (SENOKOT S) 8.6-50 MG tablet Take 1 tablet by mouth 2 (two) times daily as needed for mild constipation.    [provider]  terbinafine (LAMISIL) 250 MG tablet Take 1 tablet (250 mg total) by mouth daily. 12/23/16   Hyatt, Max T, DPM  Wheat Dextrin (BENEFIBER DRINK MIX PO) Take 1 scoop by mouth 2 (two) times daily.     [provider]     Family History  Problem Relation Age of Onset  . Cancer Mother 82       COLON   . Heart disease Mother   . Cancer Maternal Aunt   . Breast cancer Maternal Aunt   . Mental illness Maternal Uncle   . Cancer Maternal Grandmother        stomach  . Macular degeneration Maternal Grandmother   . Diabetes Neg Hx     Social History   Social History  . Marital status: Married    Spouse name: Jeneen Rinks  . Number of children: 1  . Years of education: MFA   Occupational History  . retired Librarian, academic); Artist    Social History Main Topics  . Smoking status: Former Smoker    Packs/day: 1.00    Types: Cigarettes    Quit date: 09/29/1980  . Smokeless tobacco: Never Used  . Alcohol use 0.0 oz/week     Comment: 1 glass of wine 3-4 times/week; hard liquor (highball) or a beer once a week  . Drug use: No  . Sexual activity: Not on file   Other Topics Concern  . Not on file   Social History Narrative   Lives with husband.  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 Son lives in [place]; granddaughter lives in [place] (with her mother--son is divorced)     Vital Signs: BP (!) 162/80   Pulse 83   Temp 98.2 F (36.8 C) (Oral)   Resp 15   SpO2 100%   Physical Exam  Constitutional: She is oriented to person, place, and time. She appears  well-nourished.  Abdominal: Soft. Bowel sounds are normal.  Musculoskeletal: Normal range of motion.  Neurological: She is alert and oriented to person, place, and time.  Skin: Skin is warm and dry.  Site is clean and dry NT no bleeding JP intact Cloudy fluid in JP---minimal fluid noted  Afeb  Removed drain at bedside per Dr Kathlene Cote Pt tolerated well  Psychiatric: She has a normal mood and affect. Her behavior is normal.  Nursing note and vitals reviewed.  CT today: shows resolution of collection per Dr Kathlene Cote  Imaging: No results found.  Labs:  CBC:  Recent Labs  02/18/17 [phone]  02/19/17 0523 02/20/17 0433 02/25/17 1559  WBC 17.2* 10.1 7.6 6.8  HGB 10.1* 9.2* 9.5* 11.6*  HCT 30.0* 27.6* 28.0* 36.1  PLT 477* 471* 550* 715*    COAGS:  Recent Labs  08/11/16 1256 09/30/16 1021 02/18/17 0940  INR 0.94 1.00 1.26  APTT  --  25 35    BMP:  Recent Labs  02/17/17 1527 02/18/17 0628 02/19/17 0523 02/20/17 0433 02/25/17 1559  NA 132* 134* 136 138 137  K 3.7 3.7 3.8 3.6 4.5  CL 95* 101 102 102 100  CO2 25 25 27 28 24   GLUCOSE 106* 108* 101* 97 95  BUN 10 9 8 7 13   CALCIUM 8.1* 8.0* 7.8* 8.0* 8.3*  CREATININE 0.82 0.80 0.75 0.72 0.94*  GFRNONAA >60 >60 >60 >60  --   GFRAA >60 >60 >60 >60  --     LIVER FUNCTION TESTS:  Recent Labs  02/17/17 1527 02/19/17 0523 02/20/17 0433 02/25/17 1559  BILITOT 0.5 0.3 0.3 0.2  AST 17 14* 16 20  ALT 10* 9* 9* 11  ALKPHOS 83 66 70 72  PROT 6.8 5.5* 5.8* 6.3  ALBUMIN 2.5* 1.9* 2.0* 3.0*    Assessment:  Persistent recurrent pelvic fluid collection Post GYN surgery 2017 Drain placed 02/18/2017; output minimal CT shows resolution Removal without complication  Signed: Kennedee Kitzmiller A, PA-C 03/11/2017, 1:42 PM    Please refer to Dr. Kathlene Cote attestation of this note for management and plan.

## 2017-03-12 ENCOUNTER — Ambulatory Visit: Payer: Medicare Other | Admitting: Radiation Oncology

## 2017-03-13 ENCOUNTER — Other Ambulatory Visit: Payer: Self-pay | Admitting: Hematology and Oncology

## 2017-03-13 ENCOUNTER — Encounter: Payer: Self-pay | Admitting: Interventional Radiology

## 2017-03-13 DIAGNOSIS — C541 Malignant neoplasm of endometrium: Secondary | ICD-10-CM

## 2017-03-16 ENCOUNTER — Encounter: Payer: Self-pay | Admitting: Hematology and Oncology

## 2017-03-16 ENCOUNTER — Ambulatory Visit (HOSPITAL_BASED_OUTPATIENT_CLINIC_OR_DEPARTMENT_OTHER): Payer: Medicare Other | Admitting: Hematology and Oncology

## 2017-03-16 ENCOUNTER — Telehealth: Payer: Self-pay | Admitting: Hematology and Oncology

## 2017-03-16 ENCOUNTER — Other Ambulatory Visit (HOSPITAL_BASED_OUTPATIENT_CLINIC_OR_DEPARTMENT_OTHER): Payer: Medicare Other

## 2017-03-16 DIAGNOSIS — G620 Drug-induced polyneuropathy: Secondary | ICD-10-CM | POA: Diagnosis not present

## 2017-03-16 DIAGNOSIS — C541 Malignant neoplasm of endometrium: Secondary | ICD-10-CM | POA: Diagnosis not present

## 2017-03-16 DIAGNOSIS — N289 Disorder of kidney and ureter, unspecified: Secondary | ICD-10-CM | POA: Diagnosis not present

## 2017-03-16 DIAGNOSIS — T451X5A Adverse effect of antineoplastic and immunosuppressive drugs, initial encounter: Secondary | ICD-10-CM

## 2017-03-16 LAB — CBC WITH DIFFERENTIAL/PLATELET
BASO%: 1.5 % (ref 0.0–2.0)
Basophils Absolute: 0.1 10*3/uL (ref 0.0–0.1)
EOS%: 4 % (ref 0.0–7.0)
Eosinophils Absolute: 0.2 10*3/uL (ref 0.0–0.5)
HEMATOCRIT: 35.8 % (ref 34.8–46.6)
HEMOGLOBIN: 11.7 g/dL (ref 11.6–15.9)
LYMPH#: 1.6 10*3/uL (ref 0.9–3.3)
LYMPH%: 39.5 % (ref 14.0–49.7)
MCH: 29.1 pg (ref 25.1–34.0)
MCHC: 32.7 g/dL (ref 31.5–36.0)
MCV: 89.1 fL (ref 79.5–101.0)
MONO#: 0.4 10*3/uL (ref 0.1–0.9)
MONO%: 10.9 % (ref 0.0–14.0)
NEUT%: 44.1 % (ref 38.4–76.8)
NEUTROS ABS: 1.8 10*3/uL (ref 1.5–6.5)
PLATELETS: 306 10*3/uL (ref 145–400)
RBC: 4.02 10*6/uL (ref 3.70–5.45)
RDW: 15 % — ABNORMAL HIGH (ref 11.2–14.5)
WBC: 4 10*3/uL (ref 3.9–10.3)

## 2017-03-16 LAB — COMPREHENSIVE METABOLIC PANEL
ALBUMIN: 3.1 g/dL — AB (ref 3.5–5.0)
ALT: 9 U/L (ref 0–55)
ANION GAP: 11 meq/L (ref 3–11)
AST: 19 U/L (ref 5–34)
Alkaline Phosphatase: 64 U/L (ref 40–150)
BILIRUBIN TOTAL: 0.27 mg/dL (ref 0.20–1.20)
BUN: 9.9 mg/dL (ref 7.0–26.0)
CALCIUM: 9.2 mg/dL (ref 8.4–10.4)
CO2: 28 mEq/L (ref 22–29)
CREATININE: 0.8 mg/dL (ref 0.6–1.1)
Chloride: 104 mEq/L (ref 98–109)
EGFR: 67 mL/min/{1.73_m2} — ABNORMAL LOW (ref >90)
Glucose: 93 mg/dl (ref 70–140)
Potassium: 4.2 mEq/L (ref 3.5–5.1)
Sodium: 142 mEq/L (ref 136–145)
TOTAL PROTEIN: 7.1 g/dL (ref 6.4–8.3)

## 2017-03-16 NOTE — Assessment & Plan Note (Signed)
She is discovered to have incidental renal lesion from prior imaging. I have personally reviewed her CT scan from February 2018 and the lesion is still present This coincides with the incidental finding on her recent MRI I recommend close observation only. She is not symptomatic. She had no recent complaint of hematuria She has been referred to see a urologist for follow-up

## 2017-03-16 NOTE — Progress Notes (Signed)
[facility] OFFICE PROGRESS NOTE  Patient Care Team: Rita Ohara, MD as PCP - General (Family Medicine) Susa Day, MD as Consulting Physician (Orthopedic Surgery)  SUMMARY OF ONCOLOGIC HISTORY: Oncology History   IB USC, negative nodes.     Malignant neoplasm of endometrium ([place])   04/11/2016 Pathology Results    Endometrium, curettage - HIGH GRADE ENDOMETRIAL CARCINOMA, SEE COMMENT. Microscopic Comment The overall appearance favors serous carcinoma.       04/23/2016 Initial Diagnosis    Patient presented to PCP with new vaginal discharge and some vaginal spotting, referred to Dr Dory Horn, whom she had known previously. D&C on 04-09-16 had high grade carcinoma favoring serous histology [phone]). She was referred to gyn oncology, saw Dr Denman George on 04-23-16.       04/30/2016 Imaging    Markedly thickened/widened endometrium consistent with known endometrial cancer. No evidence of serosal or extra uterine extension. 2. No findings to suggest metastatic disease involving the chest, abdomen or pelvis. 3. Indeterminant 12.5 mm right renal lesion, small enhancing mass versus hemorrhagic cyst. Attention on future scans is suggested. 4. Atherosclerotic calcifications involving the thoracic and abdominal aorta and branch vessels but no focal aneurysm. 5. Moderate stool throughout the colon and down into the rectum may suggest constipation.       05/06/2016 Surgery    Robotic hysterectomy and staging. IB USC, 0/11 nodes. Dispositioned to chemotherapy with paclitaxel and carboplatin x 6 with vaginal brachytherapy.      05/06/2016 Pathology Results    1. Lymph node, biopsy, right peri-aortic - ONE OF ONE LYMPH NODES NEGATIVE FOR MALIGNANCY (0/1). 2. Lymph node, biopsy, left peri-aortic - ONE OF ONE LYMPH NODES NEGATIVE FOR MALIGNANCY (0/1). 3. Lymph node, biopsy, right pelvic - FOUR OF FOUR LYMPH NODES NEGATIVE FOR MALIGNANCY (0/4). 4. Lymph node, biopsy, left pelvic - FIVE  OF FIVE LYMPH NODES NEGATIVE FOR MALIGNANCY (0/5). 5. Uterus +/- tubes/ovaries, neoplastic - UTERUS: -ENDO/MYOMETRIUM: INVASIVE MIXED ENDOMETRIOID AND SEROUS CARCINOMA, SPANNING 4 CM. TUMOR INVADES OUTER HALF OF MYOMETRIUM. LYMPHOVASCULAR INVASION PRESENT. SEE ONCOLOGY TABLE. LEIOMYOMA. -SEROSA: UNINVOLVED. NO MALIGNANCY. - CERVIX: BENIGN SQUAMOUS AND ENDOCERVICAL MUCOSA. NO DYSPLASIA OR MALIGNANCY. - BILATERAL OVARIES: INCLUSION CYSTS. NO MALIGNANCY. - BILATERAL FALLOPIAN TUBES: UNREMARKABLE. NO MALIGNANCY.  Specimen: Uterus, cervix, bilateral ovaries and fallopian tubes, bilateral pelvic and para-aortic lymph nodes. Procedure: Hysterectomy with bilateral salpingo-oophorectomy. Lymph node sampling performed: Bilateral pelvic and para-aortic lymph node biopsies Specimen integrity: Intact. 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      05/16/2016 Imaging    Ct abdomen: Extensive subcutaneous emphysema about the abdomen and pelvis, likely postoperative. Probable abdominal pelvic wall small volume hematomas, as above. 2. Right pelvic sidewall fluid collection is likely a seroma or lymphangioma. No explanation for left lower extremity pain. 3. Minimal ill-defined fluid in the presacral space and left adnexa. 4. Small hiatal hernia. 5. An incidental finding of potential clinical significance has been found. Indeterminate right renal lesion is similar to on the recent exam. Consider further evaluation with dedicated outpatient pre and post contrast abdominal MRI      06/11/2016 - 08/22/2016 Chemotherapy    The patient completed only three cycles due to toxicity from paclitaxel and carboplatin.       07/29/2016 Imaging    CT angiogram chest: No demonstrable pulmonary embolus. Multiple foci of atherosclerotic calcification in the aorta as well as foci of coronary artery calcification.  No edema or consolidation. No lung mass  or nodule lesion.  No adenopathy. Gallbladder absent.  Stable mild biliary duct prominence. Stable nodular opacity right lobe of thyroid which does not meet consensus guidelines criteria for further assessment      08/06/2016 - 09/03/2016 Radiation Therapy    HDR vaginal cuff brachytherapy x 5 fractions.      08/11/2016 Procedure    Ultrasound and fluoroscopically guided right internal jugular single lumen power port catheter insertion. Tip in the SVC/RA junction. Catheter ready for use.      09/23/2016 Imaging    Ct abdomen: No findings suspicious for metastatic disease in the abdomen or pelvis. 2. Thin-walled lobulated 6.9 x 5.8 x 5.8 cm right pelvic sidewall fluid collection, increased in size since 05/16/2016 CT study, favor a postoperative lymphocele, which demonstrates extrinsic mass-effect on the right bladder wall. 3. Indeterminate 1.2 cm posterior interpolar right renal cortical lesion, for which 4 month stability has been demonstrated, renal neoplasm not excluded. Recommend either dedicated renal protocol MRI or CT abdomen without and with IV contrast or continued attention on follow-up surveillance CT studies, as clinically warranted. 4. Additional findings include aortic atherosclerosis, moderate hiatal hernia, moderate sigmoid diverticulosis, tiny fat containing umbilical hernia, and degenerative disc disease, facet arthropathy and spondylolisthesis in the lower lumbar spine      09/30/2016 Procedure    She had successful CT-guided aspiration of right pelvic fluid collection. Approximately 90 mL yellow serous fluid was aspirated. Samples were sent for culture and cytology.      11/19/2016 Imaging    CT abdomen: No CT findings to suggest recurrent tumor, lymphadenopathy or metastatic disease. 2. Stable right-sided pelvic sidewall cyst with mass effect on the bladder. 3. Stable advanced atherosclerotic calcifications involving the aorta and iliac arteries. 4. Moderate size hiatal hernia. 5. Status post  cholecystectomy with stable intra and extrahepatic biliary dilatation      12/29/2016 Pathology Results    SOFT TISSUE, FINE NEEDLE ASPIRATION, RIGHT ADNEXAL CYST (SPECIMEN 1 OF 1 COLLECTED 09-30-2016) NO MALIGNANT CELLS IDENTIFIED.      02/17/2017 Imaging    CT abdomen 1. The previously noted thin walled cystic lesion in the right pelvis has undergone change in appearance ; it is now thick-walled and rim enhancing. There is associated inflammation and edema surrounding the pelvic sidewall lesion which has also increased in size. This suggests interim inflammation/infection of the cystic collection. 2. Urinary bladder displaced to the left by the thick-walled right pelvic sidewall cystic lesion. Wall thickening of the bladder is likely reactive but could also be due to a cystitis. Suggest correlation with urinalysis. 3. Stable intra and extrahepatic biliary dilatation post cholecystectomy 4. Stable 13 mm intermediate density right renal lesion 5. Diffuse diverticular disease of the colon without acute inflammation      02/17/2017 - 02/20/2017 Hospital Admission    She presented to the emergency department due to lower abdominal pain, diarrhea for 4 days associated with weakness, hypersomnolence following an episode of a gastroenteritis about a week ago after returning from the beach. She was told to decrease her Cymbalta to decrease somnolence. CT Abd/Pelvis was done which revealed that the previously seen thinwall cystic lesions in the right pelvis is now take wall and ring-enhancing with associated inflammation and edema surrounding the pelvic sidewall lesions which also has increased in size. This suggests inter-inflammation/infection of the cystic collection. The urinary bladder is displaced to the left by these lesions. General Surgery, IR, and GYN were consulted.She underwent IR Drain Right Pelvic Drain placement on 5/23 and  is improving. Drain Fluid Cx grew out some Streptococcus Viridans and  Cytology is negative. Her symptoms resolved with antibiotics       02/18/2017 Procedure    Successful CT guided placement of a 10 French all purpose drain catheter into the residual/recurrent right pelvic sidewall fluid collection with aspiration of 50 mL of purulent fluid. Samples were sent to the laboratory for both culture and cytologic analysis.      02/25/2017 Imaging    Significant improvement in the bilobed right pelvic sidewall abscess following percutaneous drain. Stable drain catheter position. Small residual abscess is noted, measurements as above. No new abscess. Otherwise stable CT of the abdomen pelvis with contrast.      03/11/2017 Imaging    Ct abdomen: Resolution of right pelvic infected fluid collection after percutaneous drainage. No further fluid is seen around the percutaneous drain and acute inflammatory changes also have nearly resolved       INTERVAL HISTORY: Please see below for problem oriented charting. She returns for further follow-up She has been ill recently She was hospitalized for pelvic abscess, resolved with IV antibiotics and drain placement Her symptoms had resolved She denies pain, nausea, vomiting or diarrhea No recent fever or chills  REVIEW OF SYSTEMS:   Constitutional: Denies fevers, chills or abnormal weight loss Eyes: Denies blurriness of vision Ears, nose, mouth, throat, and face: Denies mucositis or sore throat Respiratory: Denies cough, dyspnea or wheezes Cardiovascular: Denies palpitation, chest discomfort or lower extremity swelling Skin: Denies abnormal skin rashes Lymphatics: Denies new lymphadenopathy or easy bruising Neurological:Denies numbness, tingling or new weaknesses Behavioral/Psych: Mood is stable, no new changes  All other systems were reviewed with the patient and are negative.  I have reviewed the past medical history, past surgical history, social history and family history with the patient and they are unchanged from  previous note.  ALLERGIES:  is allergic to levaquin [levofloxacin hemihydrate]; promethazine hcl; and sulfa drugs cross reactors.  MEDICATIONS:  Current Outpatient Prescriptions  Medication Sig Dispense Refill  . amoxicillin-clavulanate (AUGMENTIN) 875-125 MG tablet Take 1 tablet by mouth every 12 (twelve) hours. 60 tablet 0  . b complex vitamins capsule Take 1 capsule by mouth daily.    . Cholecalciferol (VITAMIN D) 2000 UNITS tablet Take 2,000 Units by mouth daily.    . DULoxetine (CYMBALTA) 30 MG capsule Take 1 capsule (30 mg total) by mouth daily. 90 capsule 1  . famotidine (PEPCID) 20 MG tablet Take 1 tablet (20 mg total) by mouth 2 (two) times daily. 60 tablet 0  . ferrous sulfate 325 (65 FE) MG tablet Take 1 tablet (325 mg total) by mouth 2 (two) times daily with a meal. 60 tablet 0  . glucosamine-chondroitin 500-400 MG tablet Take 1 tablet by mouth 2 (two) times daily.     [name] levothyroxine (SYNTHROID, LEVOTHROID) 88 MCG tablet TAKE ONE TABLET BY MOUTH ONCE DAILY BEFORE  BREAKFAST. 90 tablet 1  . lidocaine-prilocaine (EMLA) cream     . Magnesium 400 MG CAPS Take 1 capsule by mouth 2 (two) times daily.     . Multiple Vitamin (MULTI-VITAMIN) tablet Take 1 tablet by mouth daily.      . naproxen sodium (ANAPROX) 220 MG tablet Take 220 mg by mouth 2 (two) times daily as needed (pain).     . polyethylene glycol (MIRALAX / GLYCOLAX) packet Take 17 g by mouth 2 (two) times daily as needed for mild constipation.     . senna-docusate (SENOKOT S) 8.6-50 MG tablet  Take 1 tablet by mouth 2 (two) times daily as needed for mild constipation.    . Wheat Dextrin (BENEFIBER DRINK MIX PO) Take 1 scoop by mouth 2 (two) times daily.     . Alpha-Lipoic Acid 600 MG CAPS Take 1 capsule by mouth daily.    [name] HYDROcodone-acetaminophen (NORCO/VICODIN) 5-325 MG tablet Take 1 tablet by mouth every 6 (six) hours as needed for moderate pain. (Patient not taking: Reported on 03/16/2017) 15 tablet 0  . terbinafine  (LAMISIL) 250 MG tablet Take 1 tablet (250 mg total) by mouth daily. (Patient not taking: Reported on 03/16/2017) 30 tablet 0   No current facility-administered medications for this visit.    Facility-Administered Medications Ordered in Other Visits  Medication Dose Route Frequency Provider Last Rate Last Dose  . influenza  inactive virus vaccine (FLUZONE/FLUARIX) injection 0.5 mL  0.5 mL Intramuscular Once Rita Ohara, MD        PHYSICAL EXAMINATION: ECOG PERFORMANCE STATUS: 1 - Symptomatic but completely ambulatory  Vitals:   03/16/17 0905  BP: (!) 147/84  Pulse: 86  Resp: 17  Temp: 98.1 F (36.7 C)   Filed Weights   03/16/17 0905  Weight: 183 lb 14.4 oz (83.4 kg)    GENERAL:alert, no distress and comfortable SKIN: skin color, texture, turgor are normal, no rashes or significant lesions EYES: normal, Conjunctiva are pink and non-injected, sclera clear OROPHARYNX:no exudate, no erythema and lips, buccal mucosa, and tongue normal  NECK: supple, thyroid normal size, non-tender, without nodularity LYMPH:  no palpable lymphadenopathy in the cervical, axillary or inguinal LUNGS: clear to auscultation and percussion with normal breathing effort HEART: regular rate & rhythm and no murmurs and no lower extremity edema ABDOMEN:abdomen soft, non-tender and normal bowel sounds Musculoskeletal:no cyanosis of digits and no clubbing  NEURO: alert & oriented x 3 with fluent speech, no focal motor/sensory deficits  LABORATORY DATA:  I have reviewed the data as listed    Component Value Date/Time   NA 137 02/25/2017 1559   NA 141 10/09/2016 0935   K 4.5 02/25/2017 1559   K 4.1 10/09/2016 0935   CL 100 02/25/2017 1559   CO2 24 02/25/2017 1559   CO2 28 10/09/2016 0935   GLUCOSE 95 02/25/2017 1559   GLUCOSE 73 10/09/2016 0935   BUN 13 02/25/2017 1559   BUN 16.0 10/09/2016 0935   CREATININE 0.94 (H) 02/25/2017 1559   CREATININE 0.8 10/09/2016 0935   CALCIUM 8.3 (L) 02/25/2017 1559    CALCIUM 9.0 10/09/2016 0935   PROT 6.3 02/25/2017 1559   PROT 6.7 10/09/2016 0935   ALBUMIN 3.0 (L) 02/25/2017 1559   ALBUMIN 3.3 (L) 10/09/2016 0935   AST 20 02/25/2017 1559   AST 16 10/09/2016 0935   ALT 11 02/25/2017 1559   ALT <6 10/09/2016 0935   ALKPHOS 72 02/25/2017 1559   ALKPHOS 73 10/09/2016 0935   BILITOT 0.2 02/25/2017 1559   BILITOT 0.24 10/09/2016 0935   GFRNONAA >60 02/20/2017 0433   GFRAA >60 02/20/2017 0433    No results found for: SPEP, UPEP  Lab Results  Component Value Date   WBC 4.0 03/16/2017   NEUTROABS 1.8 03/16/2017   HGB 11.7 03/16/2017   HCT 35.8 03/16/2017   MCV 89.1 03/16/2017   PLT 306 03/16/2017      Chemistry      Component Value Date/Time   NA 137 02/25/2017 1559   NA 141 10/09/2016 0935   K 4.5 02/25/2017 1559   K 4.1 10/09/2016  0935   CL 100 02/25/2017 1559   CO2 24 02/25/2017 1559   CO2 28 10/09/2016 0935   BUN 13 02/25/2017 1559   BUN 16.0 10/09/2016 0935   CREATININE 0.94 (H) 02/25/2017 1559   CREATININE 0.8 10/09/2016 0935      Component Value Date/Time   CALCIUM 8.3 (L) 02/25/2017 1559   CALCIUM 9.0 10/09/2016 0935   ALKPHOS 72 02/25/2017 1559   ALKPHOS 73 10/09/2016 0935   AST 20 02/25/2017 1559   AST 16 10/09/2016 0935   ALT 11 02/25/2017 1559   ALT <6 10/09/2016 0935   BILITOT 0.2 02/25/2017 1559   BILITOT 0.24 10/09/2016 0935       RADIOGRAPHIC STUDIES: I have personally reviewed the radiological images as listed and agreed with the findings in the report. Dg Chest 2 View  Result Date: 02/17/2017 CLINICAL DATA:  Shortness of breath for a couple of days with some dizziness. Former smoker. EXAM: CHEST  2 VIEW COMPARISON:  Chest CTA 07/29/2016 FINDINGS: Right jugular Port-A-Cath terminates over the lower SVC there is a small hiatal hernia. The cardiac silhouette is normal in size. Aortic atherosclerosis is noted. There is anterior eventration of the right hemidiaphragm. The lungs are clear. No pleural effusion or  pneumothorax is identified. Thoracic spondylosis and chronic retrolisthesis of T12 on L1 are noted. IMPRESSION: No active cardiopulmonary disease. Electronically Signed   By: Logan Bores M.D.   On: 02/17/2017 15:21   Ct Pelvis W Contrast  Result Date: 03/11/2017 CLINICAL DATA:  History of uterine carcinoma and postoperative right pelvic lymphocele. Status post percutaneous drainage of the lymphocele on 02/18/2017 after secondary infection and development of abscess. EXAM: CT PELVIS WITH CONTRAST TECHNIQUE: Multidetector CT imaging of the pelvis was performed using the standard protocol following the bolus administration of intravenous contrast. CONTRAST:  143mL ISOVUE-300 IOPAMIDOL (ISOVUE-300) INJECTION 61% COMPARISON:  02/17/2017 FINDINGS: Urinary Tract: The bladder is decompressed and unremarkable in appearance. Bowel: Bowel loops are decompressed and unremarkable. No evidence of free air. Vascular/Lymphatic: No pathologically enlarged lymph nodes. No significant vascular abnormality seen. Reproductive:  No masses identified. Other: Percutaneous drain prominent anterior approach enters the right pelvis. The infected lymphocele has been completely decompressed with no evidence of residual fluid collection. Surrounding inflammation also has nearly resolved. Musculoskeletal: No suspicious bone lesions identified. IMPRESSION: Resolution of right pelvic infected fluid collection after percutaneous drainage. No further fluid is seen around the percutaneous drain and acute inflammatory changes also have nearly resolved. Electronically Signed   By: Aletta Edouard M.D.   On: 03/11/2017 16:48   Ct Abdomen Pelvis W Contrast  Result Date: 02/25/2017 CLINICAL DATA:  Right pelvic abscess, status post percutaneous drain 02/18/2017 EXAM: CT ABDOMEN AND PELVIS WITH CONTRAST TECHNIQUE: Multidetector CT imaging of the abdomen and pelvis was performed using the standard protocol following bolus administration of  intravenous contrast. CONTRAST:  15mL ISOVUE-300 IOPAMIDOL (ISOVUE-300) INJECTION 61% COMPARISON:  02/18/2017, 02/17/2017 FINDINGS: Lower chest: Moderate hiatal hernia. Normal heart size. No pericardial or pleural effusion. Atherosclerosis of the lower thoracic aorta. Clear lung bases. Hepatobiliary: Remote cholecystectomy. Stable diffuse biliary dilatation, suspect postcholecystectomy related. Small 10 mm hypodense subcapsular cyst in the anterior left hepatic lobe, image 29. Portal veins are patent. Pancreas: Unremarkable. No pancreatic ductal dilatation or surrounding inflammatory changes. Spleen: Normal in size without focal abnormality. Adrenals/Urinary Tract: Normal adrenal glands. No renal obstruction or hydronephrosis. Stable subcentimeter cortical hypodense renal cysts bilaterally. Stomach/Bowel: Negative for bowel obstruction, significant dilatation, ileus, or free air. Scattered colonic  diverticulosis. No acute inflammatory process. Normal appendix. No ascites or new fluid collection. Right pelvic sidewall percutaneous abscess drain in stable position. Significant improvement in the bilobed abscess. Very small residual right pelvic sidewall collections measure 2.8 cm, image 70 and 3.7 cm, image 74. The small residual collections appear to communicate on the coronal reconstructions, image 51. Vascular/Lymphatic: Aortoiliac atherosclerosis evident. Negative for aneurysm. No retroperitoneal abnormality or hematoma. No adenopathy. Reproductive: Previous hysterectomy.  No adnexal mass. Other: No abdominal wall hernia or abnormality. No abdominopelvic ascites. Musculoskeletal: Bones are osteopenic. Degenerative changes of the spine. No acute osseous finding. IMPRESSION: Significant improvement in the bilobed right pelvic sidewall abscess following percutaneous drain. Stable drain catheter position. Small residual abscess is noted, measurements as above. No new abscess. Otherwise stable CT of the abdomen pelvis  with contrast. Electronically Signed   By: Jerilynn Mages.  Shick M.D.   On: 02/25/2017 13:09   Ct Abdomen Pelvis W Contrast  Result Date: 02/17/2017 CLINICAL DATA:  Right lower quadrant pain and weakness history of endometrial cancer EXAM: CT ABDOMEN AND PELVIS WITH CONTRAST TECHNIQUE: Multidetector CT imaging of the abdomen and pelvis was performed using the standard protocol following bolus administration of intravenous contrast. CONTRAST:  178mL ISOVUE-300 IOPAMIDOL (ISOVUE-300) INJECTION 61% COMPARISON:  11/19/2016, 09/23/2016 FINDINGS: Lower chest: Lung bases demonstrate no acute consolidation or pleural effusion. Heart size nonenlarged. Hepatobiliary: Stable subcentimeter hypodense lesions in the liver. Mild intra hepatic and extrahepatic biliary dilatation post cholecystectomy, also unchanged. Pancreas: Unremarkable. No pancreatic ductal dilatation or surrounding inflammatory changes. Spleen: Normal in size without focal abnormality. Adrenals/Urinary Tract: Adrenal glands are unremarkable. Kidneys show no hydronephrosis. Intermediate density lesion measuring 14 mm posterior cortex of the mid right kidney. Bladder is displaced to the left and appears slightly thick walled. Stomach/Bowel: Stomach nonenlarged. No dilated small bowel. Diffuse diverticular disease of the colon without acute inflammation. Vascular/Lymphatic: Aortic atherosclerosis. No significantly enlarged abdominal or pelvic lymph nodes. Reproductive: Status post hysterectomy. Other: No free air. No significant free fluid. Fat in the umbilicus. A previously stable thin walled cystic lesion in the right pelvis now demonstrates thick wall enhancement and has increased in size, now measuring 7.5 x 5.8 x 7.1 cm. There is edema and stranding within the fat surrounding the mass. Adjacent soft tissue thickening along the right pelvic sidewall. Mass effect on the bladder which is also slightly thick walled. Musculoskeletal: Degenerative changes at multiple  levels. No acute or suspicious bone lesion. IMPRESSION: 1. The previously noted thin walled cystic lesion in the right pelvis has undergone change in appearance ; it is now thick-walled and rim enhancing. There is associated inflammation and edema surrounding the pelvic sidewall lesion which has also increased in size. This suggests interim inflammation/infection of the cystic collection. 2. Urinary bladder displaced to the left by the thick-walled right pelvic sidewall cystic lesion. Wall thickening of the bladder is likely reactive but could also be due to a cystitis. Suggest correlation with urinalysis. 3. Stable intra and extrahepatic biliary dilatation post cholecystectomy 4. Stable 13 mm intermediate density right renal lesion 5. Diffuse diverticular disease of the colon without acute inflammation Electronically Signed   By: Donavan Foil M.D.   On: 02/17/2017 18:08   Ct Image Guided Drainage By Percutaneous Catheter  Result Date: 02/18/2017 INDICATION: Remote history of gynecologic malignancy, post surgical resection with persistent right pelvic fluid collection. Patient underwent CT-guided aspiration of this fluid collection on 09/30/2016 with cytologic analysis negative for evidence of malignancy. Unfortunately, patient returned to the emergency department  yesterday with abdominal pain with CT scan demonstrating findings for superimposed infection of this recurrent/residual right pelvis sidewall collection. Request made for CT-guided percutaneous drainage catheter placement for infection source control. EXAM: CT IMAGE GUIDED DRAINAGE BY PERCUTANEOUS CATHETER COMPARISON:  CT abdomen pelvis - 02/17/2017; 11/19/2016; 09/23/2016; 05/16/2016 ; CT-guided right pelvic fluid collection aspiration -09/30/2016 MEDICATIONS: The patient is currently admitted to the hospital and receiving intravenous antibiotics. The antibiotics were administered within an appropriate time frame prior to the initiation of the  procedure. ANESTHESIA/SEDATION: Moderate (conscious) sedation was employed during this procedure. A total of Versed 2 mg and Fentanyl 100 mcg was administered intravenously. Moderate Sedation Time: 17 minutes. The patient's level of consciousness and vital signs were monitored continuously by radiology nursing throughout the procedure under my direct supervision. CONTRAST:  None COMPLICATIONS: None immediate. PROCEDURE: Informed written consent was obtained from the patient after a discussion of the risks, benefits and alternatives to treatment. The patient was placed supine on the CT gantry and a pre procedural CT was performed re-demonstrating the known abscess/fluid collection within the right hemipelvis with dominant component measuring approximately 5.8 x 7.8 cm (image 25, series 2). The procedure was planned. A timeout was performed prior to the initiation of the procedure. The skin overlying the ventral aspect of the right lower abdomen/ pelvis was prepped and draped in the usual sterile fashion. The overlying soft tissues were anesthetized with 1% lidocaine with epinephrine. Appropriate trajectory was planned with the use of a 22 gauge spinal needle. An 18 gauge trocar needle was advanced into the abscess/fluid collection and a short Amplatz super stiff wire was coiled within the collection. Appropriate positioning was confirmed with a limited CT scan. The tract was serially dilated allowing placement of a 10 [place] all-purpose drainage catheter. Appropriate positioning was confirmed with a limited postprocedural CT scan. Approximately 50 mL of purulent fluid was aspirated. The tube was connected to a JP bulb and sutured in place. A dressing was placed. The patient tolerated the procedure well without immediate post procedural complication. IMPRESSION: Successful CT guided placement of a 10 French all purpose drain catheter into the residual/recurrent right pelvic sidewall fluid collection with aspiration of  50 mL of purulent fluid. Samples were sent to the laboratory for both culture and cytologic analysis. Electronically Signed   By: Sandi Mariscal M.D.   On: 02/18/2017 16:44   Ir Radiologist Eval & Mgmt  Result Date: 03/13/2017 Please refer to notes tab for details about interventional procedure. (Op Note)   ASSESSMENT & PLAN:  Malignant neoplasm of endometrium ([place]) Based on recent imaging study, she has no signs of cancer recurrence It is recommended for her to keep her port present for at least 2 years after treatment I will get her port flush every 6-8 weeks I will make sure she has a scheduled appointment to follow-up with GYN in a few months time She has appointment to see radiation oncologist in a few months I will see her back once a year  Chemotherapy-induced neuropathy ([place]) Her primary care doctor has started her on Cymbalta She tolerated Cymbalta well I recommend she continues the same and I would defer to her primary care doctor for further management  Kidney lesion, native, right She is discovered to have incidental renal lesion from prior imaging. I have personally reviewed her CT scan from February 2018 and the lesion is still present This coincides with the incidental finding on her recent MRI I recommend close observation only. She is not  symptomatic. She had no recent complaint of hematuria She has been referred to see a urologist for follow-up   No orders of the defined types were placed in this encounter.  All questions were answered. The patient knows to call the clinic with any problems, questions or concerns. No barriers to learning was detected. I spent 15 minutes counseling the patient face to face. The total time spent in the appointment was 20 minutes and more than 50% was on counseling and review of test results     [name], MD 03/16/2017 9:23 AM

## 2017-03-16 NOTE — Telephone Encounter (Signed)
Appointments scheduled per 03/16/17.  Patient was given a copy of the AVS report and appointment schedule, per 03/16/17 los.

## 2017-03-16 NOTE — Assessment & Plan Note (Signed)
Her primary care doctor has started her on Cymbalta She tolerated Cymbalta well I recommend she continues the same and I would defer to her primary care doctor for further management

## 2017-03-16 NOTE — Assessment & Plan Note (Signed)
Based on recent imaging study, she has no signs of cancer recurrence It is recommended for her to keep her port present for at least 2 years after treatment I will get her port flush every 6-8 weeks I will make sure she has a scheduled appointment to follow-up with GYN in a few months time She has appointment to see radiation oncologist in a few months I will see her back once a year

## 2017-03-19 ENCOUNTER — Ambulatory Visit: Payer: Medicare Other | Admitting: Podiatry

## 2017-03-25 ENCOUNTER — Ambulatory Visit: Payer: Medicare Other | Admitting: Gynecologic Oncology

## 2017-04-02 ENCOUNTER — Inpatient Hospital Stay: Payer: Medicare Other | Attending: Hematology and Oncology

## 2017-04-02 DIAGNOSIS — Z452 Encounter for adjustment and management of vascular access device: Secondary | ICD-10-CM

## 2017-04-02 DIAGNOSIS — C541 Malignant neoplasm of endometrium: Secondary | ICD-10-CM | POA: Diagnosis not present

## 2017-04-02 DIAGNOSIS — Z95828 Presence of other vascular implants and grafts: Secondary | ICD-10-CM

## 2017-04-02 MED ORDER — SODIUM CHLORIDE 0.9% FLUSH
10.0000 mL | INTRAVENOUS | Status: DC | PRN
  Administered 2017-04-02: 10 mL via INTRAVENOUS
  Filled 2017-04-02: qty 10

## 2017-04-02 MED ORDER — HEPARIN SOD (PORK) LOCK FLUSH 100 UNIT/ML IV SOLN
500.0000 [IU] | Freq: Once | INTRAVENOUS | Status: AC
  Administered 2017-04-02: 500 [IU] via INTRAVENOUS
  Filled 2017-04-02: qty 5

## 2017-04-02 NOTE — Patient Instructions (Signed)

## 2017-04-16 ENCOUNTER — Ambulatory Visit (INDEPENDENT_AMBULATORY_CARE_PROVIDER_SITE_OTHER): Payer: Medicare Other | Admitting: Podiatry

## 2017-04-16 ENCOUNTER — Encounter: Payer: Self-pay | Admitting: Podiatry

## 2017-04-16 DIAGNOSIS — Z79899 Other long term (current) drug therapy: Secondary | ICD-10-CM

## 2017-04-16 DIAGNOSIS — L603 Nail dystrophy: Secondary | ICD-10-CM | POA: Diagnosis not present

## 2017-04-16 MED ORDER — TERBINAFINE HCL 250 MG PO TABS: 250.0000 mg | ORAL_TABLET | Freq: Every day | ORAL | 2 refills | Status: DC

## 2017-04-16 NOTE — Progress Notes (Signed)
She presents today for follow-up of her Lamisil. She states that she never took the Lamisil because she was scared that it may interfere with something else. Since she stopped her Lamisil after completing the 120 days.  Objective: Vital signs are stable alert and oriented 3 toenails are severely fungal look much worse than they did previously. Pulses remain palpable.  Assessment: Onychomycosis severe nature.  Plan: She like to retry the Lamisil. Started her on 3 months of Lamisil follow up with her in 3 months for blood work at which time we will write her for another 3 months of Lamisil.

## 2017-04-20 ENCOUNTER — Telehealth: Payer: Self-pay | Admitting: Family Medicine

## 2017-04-20 NOTE — Telephone Encounter (Signed)
Pt states she thinks she should go back on the Duloxetine, is going to start the 30mg  again.

## 2017-04-21 NOTE — Telephone Encounter (Signed)
Pt was notified.  

## 2017-04-21 NOTE — Telephone Encounter (Signed)
Noted.  Have her let us know if/when she needs refills (I think she should have plenty of the 30mg ), and that she should f/u if she isn't seeing the response she would like (after 4 weeks of being back on it)

## 2017-04-28 ENCOUNTER — Telehealth: Payer: Self-pay | Admitting: Internal Medicine

## 2017-04-28 NOTE — Telephone Encounter (Signed)
Pt needs a written letter stating she couldn't travel for the last 18 months due to health reasons. 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

## 2017-04-28 NOTE — Telephone Encounter (Signed)
Pt's husband was notified that letter is ready and he will come pick it up

## 2017-04-28 NOTE — Telephone Encounter (Signed)
Technically, her cancer was diagnosed after her July visit with me. But, review of chart shows she was having foot problems, seen in January and May 2017 by podiatrist for treatment.  Okay to write letter stating she has been unable to travel for the last 18  Months for medical reasons.

## 2017-05-05 ENCOUNTER — Encounter: Payer: Self-pay | Admitting: *Deleted

## 2017-05-07 ENCOUNTER — Other Ambulatory Visit: Payer: Self-pay | Admitting: Physician Assistant

## 2017-05-14 ENCOUNTER — Inpatient Hospital Stay: Payer: Medicare Other | Attending: Hematology and Oncology

## 2017-05-14 DIAGNOSIS — C541 Malignant neoplasm of endometrium: Secondary | ICD-10-CM

## 2017-05-14 DIAGNOSIS — Z452 Encounter for adjustment and management of vascular access device: Secondary | ICD-10-CM

## 2017-05-14 DIAGNOSIS — Z95828 Presence of other vascular implants and grafts: Secondary | ICD-10-CM

## 2017-05-14 MED ORDER — SODIUM CHLORIDE 0.9% FLUSH
10.0000 mL | Freq: Once | INTRAVENOUS | Status: AC
  Administered 2017-05-14: 10 mL
  Filled 2017-05-14: qty 10

## 2017-06-10 ENCOUNTER — Encounter: Payer: Self-pay | Admitting: Gynecologic Oncology

## 2017-06-10 ENCOUNTER — Other Ambulatory Visit (HOSPITAL_COMMUNITY)
Admission: RE | Admit: 2017-06-10 | Discharge: 2017-06-10 | Disposition: A | Discharge status: Home / Self Care | Payer: Medicare Other | Source: Ambulatory Visit | Attending: Gynecologic Oncology | Admitting: Gynecologic Oncology

## 2017-06-10 ENCOUNTER — Other Ambulatory Visit: Payer: Self-pay | Admitting: Gynecologic Oncology

## 2017-06-10 ENCOUNTER — Ambulatory Visit: Payer: Medicare Other | Attending: Gynecologic Oncology | Admitting: Gynecologic Oncology

## 2017-06-10 VITALS — BP 127/73 | HR 95 | Temp 97.8°F | Resp 20 | Wt 189.3 lb

## 2017-06-10 DIAGNOSIS — Z8542 Personal history of malignant neoplasm of other parts of uterus: Secondary | ICD-10-CM

## 2017-06-10 DIAGNOSIS — M8580 Other specified disorders of bone density and structure, unspecified site: Secondary | ICD-10-CM | POA: Diagnosis not present

## 2017-06-10 DIAGNOSIS — Z818 Family history of other mental and behavioral disorders: Secondary | ICD-10-CM | POA: Diagnosis not present

## 2017-06-10 DIAGNOSIS — Z8249 Family history of ischemic heart disease and other diseases of the circulatory system: Secondary | ICD-10-CM | POA: Diagnosis not present

## 2017-06-10 DIAGNOSIS — M1712 Unilateral primary osteoarthritis, left knee: Secondary | ICD-10-CM | POA: Diagnosis not present

## 2017-06-10 DIAGNOSIS — Z87891 Personal history of nicotine dependence: Secondary | ICD-10-CM | POA: Diagnosis not present

## 2017-06-10 DIAGNOSIS — Z90710 Acquired absence of both cervix and uterus: Secondary | ICD-10-CM | POA: Diagnosis not present

## 2017-06-10 DIAGNOSIS — C541 Malignant neoplasm of endometrium: Secondary | ICD-10-CM

## 2017-06-10 DIAGNOSIS — E039 Hypothyroidism, unspecified: Secondary | ICD-10-CM | POA: Insufficient documentation

## 2017-06-10 DIAGNOSIS — N289 Disorder of kidney and ureter, unspecified: Secondary | ICD-10-CM | POA: Diagnosis not present

## 2017-06-10 DIAGNOSIS — Z803 Family history of malignant neoplasm of breast: Secondary | ICD-10-CM | POA: Diagnosis not present

## 2017-06-10 DIAGNOSIS — Z9842 Cataract extraction status, left eye: Secondary | ICD-10-CM | POA: Insufficient documentation

## 2017-06-10 DIAGNOSIS — E669 Obesity, unspecified: Secondary | ICD-10-CM | POA: Diagnosis present

## 2017-06-10 DIAGNOSIS — Z881 Allergy status to other antibiotic agents status: Secondary | ICD-10-CM | POA: Diagnosis not present

## 2017-06-10 DIAGNOSIS — Z9049 Acquired absence of other specified parts of digestive tract: Secondary | ICD-10-CM | POA: Insufficient documentation

## 2017-06-10 DIAGNOSIS — Z800 Family history of malignant neoplasm of digestive organs: Secondary | ICD-10-CM | POA: Diagnosis not present

## 2017-06-10 DIAGNOSIS — Z9889 Other specified postprocedural states: Secondary | ICD-10-CM | POA: Diagnosis not present

## 2017-06-10 DIAGNOSIS — Z809 Family history of malignant neoplasm, unspecified: Secondary | ICD-10-CM | POA: Insufficient documentation

## 2017-06-10 DIAGNOSIS — Z01411 Encounter for gynecological examination (general) (routine) with abnormal findings: Secondary | ICD-10-CM | POA: Insufficient documentation

## 2017-06-10 DIAGNOSIS — M5136 Other intervertebral disc degeneration, lumbar region: Secondary | ICD-10-CM | POA: Diagnosis not present

## 2017-06-10 DIAGNOSIS — Z90722 Acquired absence of ovaries, bilateral: Secondary | ICD-10-CM | POA: Diagnosis not present

## 2017-06-10 DIAGNOSIS — Z9221 Personal history of antineoplastic chemotherapy: Secondary | ICD-10-CM | POA: Diagnosis not present

## 2017-06-10 DIAGNOSIS — G620 Drug-induced polyneuropathy: Secondary | ICD-10-CM

## 2017-06-10 DIAGNOSIS — Z9841 Cataract extraction status, right eye: Secondary | ICD-10-CM | POA: Diagnosis not present

## 2017-06-10 DIAGNOSIS — Z888 Allergy status to other drugs, medicaments and biological substances status: Secondary | ICD-10-CM | POA: Diagnosis not present

## 2017-06-10 DIAGNOSIS — Z882 Allergy status to sulfonamides status: Secondary | ICD-10-CM | POA: Insufficient documentation

## 2017-06-10 DIAGNOSIS — Z86010 Personal history of colonic polyps: Secondary | ICD-10-CM | POA: Diagnosis not present

## 2017-06-10 NOTE — Progress Notes (Signed)
GYN ONCOLOGY OFFICE VISIT  Referring physician: Dr. Lenise Herald Guerrero 82 y.o. female   CC:  Chief Complaint  Patient presents with  . Endometrial cancer [facility])  Pelvic abscess  Assessment/Plan:  [name]  is an 82 y.o.  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 He is status post chemotherapy with 3 cycles of paclitaxel and carboplatin which was discontinued secondary to toxicity. She also underwent adjuvant vaginal cuff brachii therapy.   Her postoperative case was complicated by a  lymphocyst that has been previously drained and was negative for malignancy. She was admitted for 4 days in May 2018  with gastroenteritis and resulting sepsis and infection of the lymphocyst. A drain was placed, and antibiotic therapy with Augmentin has been effective. She was last seen by Dr. Skeet Guerrero for interval follow-up in June. She comes in today. She was seen by Dr. Alvy Guerrero in June and we'll see her back in June 2019. She currently is still on Cymbalta for her chemotherapy-induced neuropathy. She was noticed to have an incidental renal lesion on prior imaging. She did have an appointment with urology which was canceled for follow-up of this as she could not remember why it was scheduled. She would like to see them that she'll take the responsibility of scheduling this appointment.  I will notify her of the results for Pap smear from today.  Follow-up with Dr. Sondra Come December 2018.  She will return to see [place] about 4 months after she sees Dr.Kinard. She was congratulated on her 1 year anniversary.   HPI: Kayla Guerrero is an extremely pleasant 82 year old G2P2 who was seen in consultation at the request of Dr Kayla Guerrero for high grade (serous) endometrial cancer.   The patient reports postmenopausal spotting since July, 2017. She was seen by Dr Kayla Guerrero for this who performed a TVUS on 04/08/16 which revealed a 7.3x4x5.2cm uterus  with a 3.3cm irregular mass within the endometrium. The ovaries were normal.   A hysteroscopy and D&C was performed on 04/09/16 with several polypoid structures seen and removed. 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 The rest of her history is as below.   Oncology History   IB USC, negative nodes.     Malignant neoplasm of endometrium ([place])   04/11/2016 Pathology Results    Endometrium, curettage - HIGH GRADE ENDOMETRIAL CARCINOMA, SEE COMMENT. Microscopic Comment The overall appearance favors serous carcinoma.       04/23/2016 Initial Diagnosis    Patient presented to PCP with new vaginal discharge and some vaginal spotting, referred to Dr Kayla Guerrero, whom she had known previously. D&C on 04-09-16 had high grade carcinoma favoring serous histology [id number]). She was referred to gyn oncology, saw Dr Kayla Guerrero on 04-23-16.       04/30/2016 Imaging    Markedly thickened/widened endometrium consistent with known endometrial cancer. No evidence of serosal or extra uterine extension. 2. No findings to suggest metastatic disease involving the chest, abdomen or pelvis. 3. Indeterminant 12.5 mm right renal lesion, small enhancing mass versus hemorrhagic cyst. Attention on future scans is suggested. 4. Atherosclerotic calcifications involving the thoracic and abdominal aorta and branch vessels but no focal aneurysm. 5. Moderate stool throughout the colon and down into the rectum may suggest constipation.       05/06/2016 Surgery    Robotic hysterectomy and staging. IB USC, 0/11 nodes. Dispositioned to chemotherapy with  paclitaxel and carboplatin x 6 with vaginal brachytherapy.      05/06/2016 Pathology Results    1. Lymph node, biopsy, right peri-aortic - ONE OF ONE LYMPH NODES NEGATIVE FOR MALIGNANCY (0/1). 2. Lymph node, biopsy, left peri-aortic - ONE OF ONE LYMPH NODES NEGATIVE FOR MALIGNANCY (0/1). 3. Lymph node, biopsy, right pelvic -  FOUR OF FOUR LYMPH NODES NEGATIVE FOR MALIGNANCY (0/4). 4. Lymph node, biopsy, left pelvic - FIVE OF FIVE LYMPH NODES NEGATIVE FOR MALIGNANCY (0/5). 5. Uterus +/- tubes/ovaries, neoplastic - UTERUS: -ENDO/MYOMETRIUM: INVASIVE MIXED ENDOMETRIOID AND SEROUS CARCINOMA, SPANNING 4 CM. TUMOR INVADES OUTER HALF OF MYOMETRIUM. LYMPHOVASCULAR INVASION PRESENT. SEE ONCOLOGY TABLE. LEIOMYOMA. -SEROSA: UNINVOLVED. NO MALIGNANCY. - CERVIX: BENIGN SQUAMOUS AND ENDOCERVICAL MUCOSA. NO DYSPLASIA OR MALIGNANCY. - BILATERAL OVARIES: INCLUSION CYSTS. NO MALIGNANCY. - BILATERAL FALLOPIAN TUBES: UNREMARKABLE. NO MALIGNANCY.  Specimen: Uterus, cervix, bilateral ovaries and fallopian tubes, bilateral pelvic and para-aortic lymph nodes. Procedure: Hysterectomy with bilateral salpingo-oophorectomy. Lymph node sampling performed: Bilateral pelvic and para-aortic lymph node biopsies Specimen integrity: Intact. 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      05/16/2016 Imaging    Ct abdomen: Extensive subcutaneous emphysema about the abdomen and pelvis, likely postoperative. Probable abdominal pelvic wall small volume hematomas, as above. 2. Right pelvic sidewall fluid collection is likely a seroma or lymphangioma. No explanation for left lower extremity pain. 3. Minimal ill-defined fluid in the presacral space and left adnexa. 4. Small hiatal hernia. 5. An incidental finding of potential clinical significance has been found. Indeterminate right renal lesion is similar to on the recent exam. Consider further evaluation with dedicated outpatient pre and post contrast abdominal MRI      06/11/2016 - 08/22/2016 Chemotherapy    The patient completed only three cycles due to toxicity from paclitaxel and carboplatin.       07/29/2016 Imaging    CT angiogram chest: No demonstrable pulmonary embolus. Multiple foci of atherosclerotic calcification in the aorta as well  as foci of coronary artery calcification.  No edema or consolidation. No lung mass or nodule lesion. No adenopathy. Gallbladder absent.  Stable mild biliary duct prominence. Stable nodular opacity right lobe of thyroid which does not meet consensus guidelines criteria for further assessment      08/06/2016 - 09/03/2016 Radiation Therapy    HDR vaginal cuff brachytherapy x 5 fractions.      08/11/2016 Procedure    Ultrasound and fluoroscopically guided right internal jugular single lumen power port catheter insertion. Tip in the SVC/RA junction. Catheter ready for use.      09/23/2016 Imaging    Ct abdomen: No findings suspicious for metastatic disease in the abdomen or pelvis. 2. Thin-walled lobulated 6.9 x 5.8 x 5.8 cm right pelvic sidewall fluid collection, increased in size since 05/16/2016 CT study, favor a postoperative lymphocele, which demonstrates extrinsic mass-effect on the right bladder wall. 3. Indeterminate 1.2 cm posterior interpolar right renal cortical lesion, for which 4 month stability has been demonstrated, renal neoplasm not excluded. Recommend either dedicated renal protocol MRI or CT abdomen without and with IV contrast or continued attention on follow-up surveillance CT studies, as clinically warranted. 4. Additional findings include aortic atherosclerosis, moderate hiatal hernia, moderate sigmoid diverticulosis, tiny fat containing umbilical hernia, and degenerative disc disease, facet arthropathy and spondylolisthesis in the lower lumbar spine      09/30/2016 Procedure    She had successful CT-guided aspiration of right pelvic fluid collection. Approximately 90 mL yellow serous fluid  was aspirated. Samples were sent for culture and cytology.      11/19/2016 Imaging    CT abdomen: No CT findings to suggest recurrent tumor, lymphadenopathy or metastatic disease. 2. Stable right-sided pelvic sidewall cyst with mass effect on the bladder. 3. Stable advanced atherosclerotic  calcifications involving the aorta and iliac arteries. 4. Moderate size hiatal hernia. 5. Status post cholecystectomy with stable intra and extrahepatic biliary dilatation      12/29/2016 Pathology Results    SOFT TISSUE, FINE NEEDLE ASPIRATION, RIGHT ADNEXAL CYST (SPECIMEN 1 OF 1 COLLECTED 09-30-2016) NO MALIGNANT CELLS IDENTIFIED.      02/17/2017 Imaging    CT abdomen 1. The previously noted thin walled cystic lesion in the right pelvis has undergone change in appearance ; it is now thick-walled and rim enhancing. There is associated inflammation and edema surrounding the pelvic sidewall lesion which has also increased in size. This suggests interim inflammation/infection of the cystic collection. 2. Urinary bladder displaced to the left by the thick-walled right pelvic sidewall cystic lesion. Wall thickening of the bladder is likely reactive but could also be due to a cystitis. Suggest correlation with urinalysis. 3. Stable intra and extrahepatic biliary dilatation post cholecystectomy 4. Stable 13 mm intermediate density right renal lesion 5. Diffuse diverticular disease of the colon without acute inflammation      02/17/2017 - 02/20/2017 Hospital Admission    She presented to the emergency department due to lower abdominal pain, diarrhea for 4 days associated with weakness, hypersomnolence following an episode of a gastroenteritis about a week ago after returning from the beach. She was told to decrease her Cymbalta to decrease somnolence. CT Abd/Pelvis was done which revealed that the previously seen thinwall cystic lesions in the right pelvis is now take wall and ring-enhancing with associated inflammation and edema surrounding the pelvic sidewall lesions which also has increased in size. This suggests inter-inflammation/infection of the cystic collection. The urinary bladder is displaced to the left by these lesions. General Surgery, IR, and GYN were consulted.She underwent IR Drain Right Pelvic  Drain placement on 5/23 and is improving. Drain Fluid Cx grew out some Streptococcus Viridans and Cytology is negative. Her symptoms resolved with antibiotics       02/18/2017 Procedure    Successful CT guided placement of a 10 French all purpose drain catheter into the residual/recurrent right pelvic sidewall fluid collection with aspiration of 50 mL of purulent fluid. Samples were sent to the laboratory for both culture and cytologic analysis.      02/25/2017 Imaging    Significant improvement in the bilobed right pelvic sidewall abscess following percutaneous drain. Stable drain catheter position. Small residual abscess is noted, measurements as above. No new abscess. Otherwise stable CT of the abdomen pelvis with contrast.      03/11/2017 Imaging    Ct abdomen: Resolution of right pelvic infected fluid collection after percutaneous drainage. No further fluid is seen around the percutaneous drain and acute inflammatory changes also have nearly resolved      Interval History: She was last seen by Dr. Skeet Guerrero in June and comes in today for follow-up. She did cancel the appointment with urology as she had forgotten that was scheduled to follow-up on the right renal cyst. She will call and reschedule that. She feels a little dizzy as she is taking Sudafed for cold. She recently returned from about an 11 day trip to [place] with her stepdaughter. She is complaining of some knee and shoulder discomfort. One day she  did walk over 13,000 steps and is just having some fatigue still from that. She does have a slight nonproductive cough and if it is productive it has clear phlegm. She's just a little bit tired and does have knee pain. Remainder of her review of systems is as below.   Review of Systems  Constitutional: Denies fever. Feels fatigued Skin: No rash Pulmonary: Has a cold with a slight cough productive it is clear. Cardiovascular: No chest pain, shortness of breath, or edema  Gastro  Intestinal:  No nausea, vomiting, constipation, or diarrhea reported.  Genitourinary: Denies vaginal bleeding and discharge.  Musculoskeletal: Complaining of bilateral knee and shoulder pain Psych:  Fatigued, moved seems improved.  Current Meds:  Outpatient Encounter Prescriptions as of 06/10/2017  Medication Sig  . Alpha-Lipoic Acid 600 MG CAPS Take 1 capsule by mouth daily.  [name] b complex vitamins capsule Take 1 capsule by mouth daily.  . Cholecalciferol (VITAMIN D) 2000 UNITS tablet Take 2,000 Units by mouth daily.  . DULoxetine (CYMBALTA) 30 MG capsule Take 1 capsule (30 mg total) by mouth daily.  . ferrous sulfate 325 (65 FE) MG tablet Take 1 tablet (325 mg total) by mouth 2 (two) times daily with a meal.  . glucosamine-chondroitin 500-400 MG tablet Take 1 tablet by mouth 2 (two) times daily.   [name] HYDROcodone-acetaminophen (NORCO/VICODIN) 5-325 MG tablet Take 1 tablet by mouth every 6 (six) hours as needed for moderate pain.  [name] levothyroxine (SYNTHROID, LEVOTHROID) 88 MCG tablet TAKE ONE TABLET BY MOUTH ONCE DAILY BEFORE  BREAKFAST.  [name] lidocaine-prilocaine (EMLA) cream   . Magnesium 400 MG CAPS Take 1 capsule by mouth 2 (two) times daily.   . Multiple Vitamin (MULTI-VITAMIN) tablet Take 1 tablet by mouth daily.    . naproxen sodium (ANAPROX) 220 MG tablet Take 220 mg by mouth 2 (two) times daily as needed (pain).   . polyethylene glycol (MIRALAX / GLYCOLAX) packet Take 17 g by mouth 2 (two) times daily as needed for mild constipation.   . senna-docusate (SENOKOT S) 8.6-50 MG tablet Take 1 tablet by mouth 2 (two) times daily as needed for mild constipation.  . terbinafine (LAMISIL) 250 MG tablet Take 1 tablet (250 mg total) by mouth daily.  . Wheat Dextrin (BENEFIBER DRINK MIX PO) Take 1 scoop by mouth 2 (two) times daily.   . [DISCONTINUED] famotidine (PEPCID) 20 MG tablet Take 1 tablet (20 mg total) by mouth 2 (two) times daily.   Facility-Administered Encounter Medications as of  06/10/2017  Medication  . influenza  inactive virus vaccine (FLUZONE/FLUARIX) injection 0.5 mL    Allergy:  Allergies  Allergen Reactions  . Levaquin [Levofloxacin Hemihydrate] Other (See Comments)  . Promethazine Hcl Other (See Comments)    fainting  . Sulfa Drugs Cross Reactors Swelling    Social Hx:   Social History   Social History  . Marital status: Married    Spouse name: Jeneen Rinks  . Number of children: 1  . Years of education: MFA   Occupational History  . retired Librarian, academic); Artist    Social History Main Topics  . Smoking status: Former Smoker    Packs/day: 1.00    Types: Cigarettes    Quit date: 09/29/1980  . Smokeless tobacco: Never Used  . Alcohol use 0.0 oz/week     Comment: 1 glass of wine 3-4 times/week; hard liquor (highball) or a beer once a week  . Drug use: No  . Sexual activity: Not on file   Other  Topics Concern  . Not on file   Social History Narrative   Lives with husband.  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 Son lives in [place]; granddaughter lives in [place] (with her mother--son is divorced)    Past Surgical Hx:  Past Surgical History:  Procedure Laterality Date  . CATARACT EXTRACTION, BILATERAL    . CHOLECYSTECTOMY    . COLONOSCOPY  2010, 04/2013   Buccini  . IR GENERIC HISTORICAL  08/11/2016   IR FLUORO GUIDE PORT INSERTION RIGHT 08/11/2016 Greggory Keen, MD [facility]  . IR GENERIC HISTORICAL  08/11/2016   IR US GUIDE VASC ACCESS RIGHT 08/11/2016 Greggory Keen, MD [facility]  . IR RADIOLOGIST EVAL & MGMT  02/25/2017  . IR RADIOLOGIST EVAL & MGMT  03/11/2017  . PTERYGIUM EXCISION    . ROBOTIC ASSISTED TOTAL HYSTERECTOMY WITH BILATERAL SALPINGO OOPHERECTOMY N/A 05/06/2016   Procedure: XI ROBOTIC ASSISTED  LAPAROSCOPIC TOTAL HYSTERECTOMY WITH BILATERAL SALPINGO OOPHORECTOMY;  Surgeon: Nancy Marus, MD;  Location: [facility];   Service: Gynecology;  Laterality: N/A;  . SENTINEL NODE BIOPSY N/A 05/06/2016   Procedure: SENTINEL NODE BIOPSY;  Surgeon: Nancy Marus, MD;  Location: [facility];  Service: Gynecology;  Laterality: N/A;  . TUBAL LIGATION      Past Medical Hx:  Past Medical History:  Diagnosis Date  . Arthritis   . Arthritis of knee, left   . Borderline osteopenia DEXA 2006 and 2012  . Cancer [facility])    endometrial  . Cataract   . Cholesterol serum elevated   . Colon polyps   . DDD (degenerative disc disease), lumbar   . FHx: colon cancer   . GERD (gastroesophageal reflux disease)   . History of radiation therapy 08/06/16 - 09/03/16   vaginal cuff treated to 30 Gy in 5 fractions  . Hypothyroidism   . Obesity     Past Gynecological History:  Tubal ligation, SVD x 2. No LMP recorded. Patient is postmenopausal.  Family Hx:  Family History  Problem Relation Age of Onset  . Cancer Mother 49       COLON   . Heart disease Mother   . Cancer Maternal Aunt   . Breast cancer Maternal Aunt   . Mental illness Maternal Uncle   . Cancer Maternal Grandmother        stomach  . Macular degeneration Maternal Grandmother   . Diabetes Neg Hx     Vitals:  Blood pressure 127/73, pulse 95, temperature 97.8 F (36.6 C), temperature source Oral, resp. rate 20, weight 189 lb 4.8 oz (85.9 kg), SpO2 97 %.  Physical Exam: Well-nourished well-developed female in no acute distress. Neck: Supple, no lymphadenopathy, no thyromegaly. Lungs: Clear to auscultation bilaterally. Heart: Regular rate and rhythm Abdomen: Well-healed surgical incisions. Abdomen is soft, nontender, nondistended. There is no appreciable incisional hernias.  Extremities: No edema Pelvic: External genitalia within normal limits though atrophic. The vagina is markedly atrophic. The vaginal cuff is without  lesions or discharge. There are no visible lesions. Pap smear submitted without difficulty Bimanual examination reveals no thickening and nodularity  or masses. There is no tenderness or fluctuance. Rectal: There are no masses. No nodularity. Large hemorrhoids. Lymph node assessment: No cervical supraclavicular or inguinal adenopathy

## 2017-06-10 NOTE — Progress Notes (Signed)
Chief Complaint  Patient presents with  . Medicare Wellness    fasting AWV/CPE. Unsure if she needs pelvic exam saw surgeon yesterday and had pap smear. Did not do eye exam, had one recently and prefers to do at eye doctor. Right hand she has trigger finger that is new.      Kayla Guerrero is a 82 y.o. female who presents for annual physical exam, Medicare wellness visit and follow-up on chronic medical conditions.  She has the following concerns:  She caught a cold the last night she was in [place].  Taking sudafed and mucinex, which has been helping. Mucus is clear, no fevers.  Pain in knees, legs, shoulders. Pain started prior to her recent trip to [place]. Pain in her knees and legs, calves.  Her ability to move around is slowed significantly. Still has the neuropathy in both feet, right hand. Tingling, "annoyance", not necessarily very painful. Tylenol Arthritis isn't helping too much.  Depression.  She was started on Cymbalta in late January, for both depression and neuropathy.  She had a better response in moods than with pain.  She had gone off the meds (May), but restarted at 30mg  in late July.  Moods are better, but she doesn't note much improvement in the neuropathy (both feet, right hand). Perhaps she is having less shooting pains than in the past.  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 She has AFO, but won't wear it.   She has seen Dr. Nelva Bush for epidural injection, which helped her back and leg pain.  She saw Dr. Anne Fu PA and had knee injections (bilateral) earlier this year, which weren't helpful. She has been going to water aerobics (less often related to doctor's appointments, travel), but not walking as much as she used to, due to pain. She walked 13K steps on recent trip to [place]--exhausted the next day.   Hyperlipidemia: She used to take red yeast rice 3/day (1 in the morning, 2 at dinner)  without any side effects.She stopped this, and would like to see how her numbers are (may have been off prior to last check as well, she can't recall).  Eating more sweets, bread, some more cheese, mostly related to being in [place] recently.  Still doesn't eat a lot of meat (mostly chicken) or eggs. +creamy sauces, ice cream.  Lab Results  Component Value Date   CHOL 223 (H) 12/16/2016   HDL 76 12/16/2016   LDLCALC 134 (H) 12/16/2016   TRIG 65 12/16/2016   CHOLHDL 2.9 12/16/2016   Hypothyroid--her energy isn't the same as it was, has had a rough last year.  Denies changes in hair/skin/bowel (just related to chemo, always has dry skin).Has some constipation, that is usually pretty controlled with daily fiber. She takes her thyroid medication first thing when she wakes up, separate from other medications.  Denies any missed pills. She needs refill. Lab Results  Component Value Date   TSH 2.02 12/16/2016   Endometrial Cancer:  S/p chemotherapy with 3 cycles of paclitaxel and carboplatin which was discontinued secondary to toxicity. She also underwent adjuvant vaginal cuff brachii therapy. She has residual neuropathy from chemo, somewhat improved with Cymbalta. She developed a lymphocyst that was drained and was negative for malignancy. She was admitted for 4 days in May 2018  with gastroenteritis and resulting sepsis and infection of the lymphocyst. No further stomach pain. Saw Dr. Alycia Rossetti yesterday and had pap smear.  Plans to see Dr. Diona Fanti next month to discuss the abnormal CT finding on the kidney.   Immunization History  Administered Date(s) Administered  . H1N1 06/21/2009  . Influenza Split 06/19/2011, 06/22/2012  . Influenza Whole 07/12/2010  . Influenza, High Dose Seasonal PF 07/06/2013, 07/21/2014, 07/10/2015  . Influenza,inj,Quad PF,6+ Mos 06/23/2016  . Pneumococcal Conjugate-13 10/12/2014  . Pneumococcal Polysaccharide-23 08/20/2010  . Tdap 12/12/2010  . Zoster 09/30/2011    Last Pap smear: yesterday Last mammogram: 06/2016 Last colonoscopy: 04/2013, Dr. Cristina Gong Last DEXA: 03/2014 Normal Dentist: twice yearly Ophtho: yearly (Dr. Herbert Deaner) Exercise: Water exercises 3x/week. Hasn't been walking regularly.  Sees dermatologist for routine skin checks   Other doctors caring for patient include: Ophtho: Dr. Kathlen Mody (at Dr. Payton Emerald office) Dentist:  Dr. Johnn Hai Endodontist: Dr. Satira Sark Ortho: Dr. Wynelle Link, Dr. Nelva Bush Derm: Dr. Bonney Roussel: Dr. Cristina Gong Gyn-Onc: Dr. Alycia Rossetti Oncologist: Dr. Alvy Bimler Urologist: Dr. Diona Fanti Podiatrist: Dr. Milinda Pointer  Depression screen: Negative. PHQ-9 score of 1 (fatigue). Fall screen: tripped in a parking lot at [employer] a few months ago (no injury); tripped once on her recent trip, fell getting onto a boat (some bruises on leg); she has some balance issues.  She has exercises to do at home but hasn't been doing them.  Functional Status screen: notable only for very slight hearing loss, and slight memory issues, unchanged from last year.  She has some difficulty walking (knee and leg pain) and occasional urge incontinence See epic questionnaire for full screen.  End of Life Discussion:  Patient has a living will and medical power of attorney  Past Medical History:  Diagnosis Date  . Arthritis   . Arthritis of knee, left   . Borderline osteopenia DEXA 2006 and 2012  . Cancer [facility])    endometrial  . Cataract   . Cholesterol serum elevated   . Colon polyps   . DDD (degenerative disc disease), lumbar   . FHx: colon cancer   . GERD (gastroesophageal reflux disease)   . History of radiation therapy 08/06/16 - 09/03/16   vaginal cuff treated to 30 Gy in 5 fractions  . Hypothyroidism   . Obesity     Past Surgical History:  Procedure Laterality Date  . CATARACT EXTRACTION, BILATERAL    . CHOLECYSTECTOMY    . COLONOSCOPY  2010, 04/2013   Buccini  . IR GENERIC HISTORICAL  08/11/2016   IR FLUORO GUIDE PORT INSERTION RIGHT  08/11/2016 Greggory Keen, MD [facility]  . IR GENERIC HISTORICAL  08/11/2016   IR US GUIDE VASC ACCESS RIGHT 08/11/2016 Greggory Keen, MD [facility]  . IR RADIOLOGIST EVAL & MGMT  02/25/2017  . IR RADIOLOGIST EVAL & MGMT  03/11/2017  . PTERYGIUM EXCISION    . ROBOTIC ASSISTED TOTAL HYSTERECTOMY WITH BILATERAL SALPINGO OOPHERECTOMY N/A 05/06/2016   Procedure: XI ROBOTIC ASSISTED  LAPAROSCOPIC TOTAL HYSTERECTOMY WITH BILATERAL SALPINGO OOPHORECTOMY;  Surgeon: Nancy Marus, MD;  Location: [facility];  Service: Gynecology;  Laterality: N/A;  . SENTINEL NODE BIOPSY N/A 05/06/2016   Procedure: SENTINEL NODE BIOPSY;  Surgeon: Nancy Marus, MD;  Location: [facility];  Service: Gynecology;  Laterality: N/A;  . TUBAL LIGATION      Social History   Social History  . Marital status: Married    Spouse name: Jeneen Rinks  . Number of children: 1  . Years of education: MFA   Occupational History  . retired Librarian, academic); Artist    Social History Main Topics  . Smoking status: Former Smoker  Packs/day: 1.00    Types: Cigarettes    Quit date: 09/29/1980  . Smokeless tobacco: Never Used  . Alcohol use 0.0 oz/week     Comment: 1 glass of wine 3-4 times/week; hard liquor (highball) or a beer once a week  . Drug use: No  . Sexual activity: Yes    Partners: Male   Other Topics Concern  . Not on file   Social History Narrative   Lives with husband.  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 Son lives in [place]; granddaughter (transgender--now grandson) lives in [place] (with her mother--son is divorced)    Family History  Problem Relation Age of Onset  . Cancer Mother 61       COLON   . Heart disease Mother   . Cancer Maternal Aunt   . Breast cancer Maternal Aunt   . Mental illness Maternal Uncle   . Cancer Maternal Grandmother        stomach? more likely uterine  . Macular degeneration Maternal  Grandmother   . Diabetes Neg Hx     Outpatient Encounter Prescriptions as of 06/11/2017  Medication Sig Note  . Alpha-Lipoic Acid 600 MG CAPS Take 1 capsule by mouth daily.   [name] b complex vitamins capsule Take 1 capsule by mouth daily.   . Cholecalciferol (VITAMIN D) 2000 UNITS tablet Take 2,000 Units by mouth daily.   . DULoxetine (CYMBALTA) 30 MG capsule Take 1 capsule (30 mg total) by mouth daily.   [name] glucosamine-chondroitin 500-400 MG tablet Take 1 tablet by mouth 2 (two) times daily.    [name] HYDROcodone-acetaminophen (NORCO/VICODIN) 5-325 MG tablet Take 1 tablet by mouth every 6 (six) hours as needed for moderate pain.   [name] levothyroxine (SYNTHROID, LEVOTHROID) 88 MCG tablet TAKE ONE TABLET BY MOUTH ONCE DAILY BEFORE  BREAKFAST.   . Magnesium 400 MG CAPS Take 1 capsule by mouth 2 (two) times daily.    . Multiple Vitamin (MULTI-VITAMIN) tablet Take 1 tablet by mouth daily.     . naproxen sodium (ANAPROX) 220 MG tablet Take 220 mg by mouth 2 (two) times daily as needed (pain).  06/11/2017: Uses prn, not frequently  . polyethylene glycol (MIRALAX / GLYCOLAX) packet Take 17 g by mouth 2 (two) times daily as needed for mild constipation.    . senna-docusate (SENOKOT S) 8.6-50 MG tablet Take 1 tablet by mouth 2 (two) times daily as needed for mild constipation.   . terbinafine (LAMISIL) 250 MG tablet Take 1 tablet (250 mg total) by mouth daily.   . Wheat Dextrin (BENEFIBER DRINK MIX PO) Take 1 scoop by mouth 2 (two) times daily.    . ferrous sulfate 325 (65 FE) MG tablet Take 1 tablet (325 mg total) by mouth 2 (two) times daily with a meal. (Patient not taking: Reported on 06/11/2017)   . lidocaine-prilocaine (EMLA) cream  10/09/2016: Received from: External Pharmacy   Facility-Administered Encounter Medications as of 06/11/2017  Medication  . influenza  inactive virus vaccine (FLUZONE/FLUARIX) injection 0.5 mL    Allergies  Allergen Reactions  . Levaquin [Levofloxacin Hemihydrate] Other (See  Comments)  . Promethazine Hcl Other (See Comments)    fainting  . Sulfa Drugs Cross Reactors Swelling     ROS: The patient denies anorexia, fever, headaches, vision changes, ear pain, sore throat, breast concerns, chest pain, palpitations, syncope, dyspnea on exertion, cough, swelling, nausea, vomiting, diarrhea, abdominal  pain, melena, hematochezia, hematuria, incontinence (rare urge incontinence, slight), dysuria, vaginal bleeding, discharge, odor or itch, genital lesions, tremor, suspicious skin lesions, depression, anxiety, abnormal bleeding/bruising, or enlarged lymph nodes. Intermittent trouble sleeping, not often or bothersome. Knee, leg and right shoulder pain per HPI.  No longer having back pain. +memory troubles (unimportant things, nothing significant, unchanged--names)  Constipation--controlled with diet, fiber supplement. Flaring a little now. Neuropathy in her feet--mostly tingling.  Some in right hand, sharp pains are less frequent. Congestion/cold symptoms, resolving.  PHYSICAL EXAM:  BP 110/72 (BP Location: Left Arm, Patient Position: Sitting, Cuff Size: Normal)   Pulse 92   Temp 97.9 F (36.6 C) (Tympanic)   Ht 5' 6.5" (1.689 m)   Wt 189 lb 6.4 oz (85.9 kg)   BMI 30.11 kg/m   Wt Readings from Last 3 Encounters:  06/11/17 189 lb 6.4 oz (85.9 kg)  06/10/17 189 lb 4.8 oz (85.9 kg)  03/16/17 183 lb 14.4 oz (83.4 kg)   175# at her CPE 09/2015  General Appearance:  Alert, cooperative, no distress, appears stated age. Accompanied by her husband.  Head:  Normocephalic, without obvious abnormality, atraumatic   Eyes:  PERRL, conjunctiva/corneas clear, EOM's intact, fundi benign   Ears:  Normal TM's and external ear canals.  Nose:  Nares normal, mucosa mildly edematous, no drainage or sinus tenderness   Throat:  Lips, mucosa, and tongue normal; teeth and gums normal   Neck:  Supple, no lymphadenopathy; thyroid: no enlargement/tenderness/nodules;  no carotid bruit or JVD   Back:  Spine nontender, no curvature, ROM normal, no CVA tenderness  Lungs:  Clear to auscultation bilaterally without wheezes, rales or ronchi; respirations unlabored   Chest Wall:  No tenderness or deformity   Heart:  Regular rate and rhythm, S1 and S2 normal, no murmur, rub  or gallop   Breast Exam:  No tenderness, masses, or nipple discharge or inversion. No axillary lymphadenopathy   Abdomen:  Soft, non-tender, nondistended, normoactive bowel sounds,  no masses, no hepatosplenomegaly   Genitalia:  Deferred to GYN  Rectal:  Deferred today.  Extremities:  No clubbing, cyanosis or edema. Toenails are polished, somewhat thickened. Painful arc (abduction) right shoulder.  Normal RC testing without weakness or significant pain.  Some tenderness to right anterior shoudeer, and some "catching"--pain during arc.  Pulses:  2+ and symmetric all extremities--somewhat diminished DP on the right, but 2+ PT bilaterally.  Skin:  Skin color, texture, turgor normal, no rashes or lesions. many angiomas, and other benign skin changes.  Lymph nodes:  Cervical, supraclavicular, and axillary nodes normal   Neurologic:  CNII-XII intact, normal sensation and gait; reflexes 2+ and symmetric throughout. Normal gait (minimal L foot drop noted)   Psych:  Normal mood, affect, hygiene and grooming   ASSESSMENT/PLAN:  Annual physical exam - Plan: POCT Urinalysis DIP (Proadvantage Device), TSH, Lipid panel, Glucose, random  Encounter for Medicare annual wellness exam  Hypothyroidism, unspecified type - Plan: TSH, levothyroxine (SYNTHROID, LEVOTHROID) 88 MCG tablet  Pure hypercholesterolemia - Plan: Lipid panel  Depression, major, in remission ([place]) - continue cymbalta 30mg   Malignant neoplasm of endometrium ([place])  Need for influenza vaccination - Plan: Flu vaccine HIGH DOSE PF (Fluzone High dose)  Immunization  due - Plan: Pneumococcal polysaccharide vaccine 23-valent greater than or equal to 2yo subcutaneous/IM  Depression, major, single episode, mild ([place]) - Plan: DULoxetine (CYMBALTA) 30 MG capsule  Chemotherapy-induced neuropathy ([place]) - continue cymbalta 30mg  - Plan: DULoxetine (CYMBALTA) 30 MG capsule  Viral upper  respiratory tract infection - no evidence of bacterial infection--sx reiewed.  supportive measures  Arthralgia, unspecified joint - to f/u with Dr. Wynelle Link for her knee pain. May try Aleve (with GI precautions reviewed) short-term for shoulder pain.  Frequent falls - pos related to neuropathy and not wearing AFO. Encouraged to resume HEP. Declines PT--encouraged. start PT if ongoing falls   TSH, lipid, glu  Discussed monthly self breast exams and yearly mammograms; at least 30 minutes of aerobic activity at least 5 days/week, weight-bearing exercise 2x/wk; proper sunscreen use reviewed; healthy diet, including goals of calcium and vitamin D intake and alcohol recommendations (less than or equal to 1 drink/day) reviewed; regular seatbelt use; changing batteries in smoke detectors. Immunization recommendations discussed--high dose flu shot given; pneumovax booster given. Shingrix recommended when more readily available. Risks/side effects/benefits reviewed. Colonoscopy recommendations reviewed--UTD, due again 04/2018. (cologard vs colonoscopy)  +FH colon cancer.   End of Life discussion--has power of attorney and living will. She desires to be Full Code, Full Care.  MOST form discussed.  Please do your home exercises daily.  If you notice ongoing problems with strength, walking or balance, still having some trips/falls, please let us know and we will send you back for additional physical therapy.   Medicare Attestation I have personally reviewed: The patient's medical and social history Their use of alcohol, tobacco or illicit drugs Their current medications and  supplements The patient's functional ability including ADLs,fall risks, home safety risks, cognitive, and hearing and visual impairment Diet and physical activities Evidence for depression or mood disorders  The patient's weight, height, and BMI have been recorded in the chart.  I have made referrals, counseling, and provided education to the patient based on review of the above and I have provided the patient with a written personalized care plan for preventive services.

## 2017-06-10 NOTE — Patient Instructions (Signed)
We will notify you of the results of your Pap smear. Please follow-up with Dr.Kinard as scheduled in December and return to see [place] about 3 months after that. Please give our office a call to schedule once you see him. Please also call to reschedule the appointment with urology to follow-up on the cyst on your right kidney. Happy one year anniversary!

## 2017-06-11 ENCOUNTER — Encounter: Payer: Self-pay | Admitting: Family Medicine

## 2017-06-11 ENCOUNTER — Ambulatory Visit (INDEPENDENT_AMBULATORY_CARE_PROVIDER_SITE_OTHER): Payer: Medicare Other | Admitting: Family Medicine

## 2017-06-11 VITALS — BP 110/72 | HR 92 | Temp 97.9°F | Ht 66.5 in | Wt 189.4 lb

## 2017-06-11 DIAGNOSIS — M2550 Pain in unspecified joint: Secondary | ICD-10-CM

## 2017-06-11 DIAGNOSIS — T451X5A Adverse effect of antineoplastic and immunosuppressive drugs, initial encounter: Secondary | ICD-10-CM | POA: Diagnosis not present

## 2017-06-11 DIAGNOSIS — J069 Acute upper respiratory infection, unspecified: Secondary | ICD-10-CM

## 2017-06-11 DIAGNOSIS — C541 Malignant neoplasm of endometrium: Secondary | ICD-10-CM

## 2017-06-11 DIAGNOSIS — Z23 Encounter for immunization: Secondary | ICD-10-CM | POA: Diagnosis not present

## 2017-06-11 DIAGNOSIS — F320 Major depressive disorder, single episode, mild: Secondary | ICD-10-CM | POA: Diagnosis not present

## 2017-06-11 DIAGNOSIS — Z0000 Encounter for general adult medical examination without abnormal findings: Secondary | ICD-10-CM | POA: Diagnosis not present

## 2017-06-11 DIAGNOSIS — G620 Drug-induced polyneuropathy: Secondary | ICD-10-CM | POA: Diagnosis not present

## 2017-06-11 DIAGNOSIS — R296 Repeated falls: Secondary | ICD-10-CM

## 2017-06-11 DIAGNOSIS — F325 Major depressive disorder, single episode, in full remission: Secondary | ICD-10-CM | POA: Diagnosis not present

## 2017-06-11 DIAGNOSIS — E039 Hypothyroidism, unspecified: Secondary | ICD-10-CM

## 2017-06-11 DIAGNOSIS — E7800 Pure hypercholesterolemia, unspecified: Secondary | ICD-10-CM | POA: Diagnosis not present

## 2017-06-11 LAB — POCT URINALYSIS DIP (PROADVANTAGE DEVICE)
BILIRUBIN UA: NEGATIVE
BILIRUBIN UA: NEGATIVE mg/dL
Glucose, UA: NEGATIVE mg/dL
Leukocytes, UA: NEGATIVE
Nitrite, UA: NEGATIVE
Protein Ur, POC: NEGATIVE mg/dL
RBC UA: NEGATIVE
SPECIFIC GRAVITY, URINE: 1.015
UUROB: NEGATIVE
pH, UA: 7 (ref 5.0–8.0)

## 2017-06-11 MED ORDER — DULOXETINE HCL 30 MG PO CPEP: 30.0000 mg | ORAL_CAPSULE | Freq: Every day | ORAL | 3 refills | Status: DC

## 2017-06-11 MED ORDER — LEVOTHYROXINE SODIUM 88 MCG PO TABS: ORAL_TABLET | ORAL | 3 refills | Status: DC

## 2017-06-11 NOTE — Patient Instructions (Addendum)
  HEALTH MAINTENANCE RECOMMENDATIONS:  It is recommended that you get at least 30 minutes of aerobic exercise at least 5 days/week (for weight loss, you may need as much as 60-90 minutes). This can be any activity that gets your heart rate up. This can be divided in 10-15 minute intervals if needed, but try and build up your endurance at least once a week.  Weight bearing exercise is also recommended twice weekly.  Eat a healthy diet with lots of vegetables, fruits and fiber.  "Colorful" foods have a lot of vitamins (ie green vegetables, tomatoes, red peppers, etc).  Limit sweet tea, regular sodas and alcoholic beverages, all of which has a lot of calories and sugar.  Up to 1 alcoholic drink daily may be beneficial for women (unless trying to lose weight, watch sugars).  Drink a lot of water.  Calcium recommendations are 1200-1500 mg daily (1500 mg for postmenopausal women or women without ovaries), and vitamin D 1000 IU daily.  This should be obtained from diet and/or supplements (vitamins), and calcium should not be taken all at once, but in divided doses.  Monthly self breast exams and yearly mammograms for women over the age of 41 is recommended.  Sunscreen of at least SPF 30 should be used on all sun-exposed parts of the skin when outside between the hours of 10 am and 4 pm (not just when at beach or pool, but even with exercise, golf, tennis, and yard work!)  Use a sunscreen that says "broad spectrum" so it covers both UVA and UVB rays, and make sure to reapply every 1-2 hours.  Remember to change the batteries in your smoke detectors when changing your clock times in the spring and fall.  Use your seat belt every time you are in a car, and please drive safely and not be distracted with cell phones and texting while driving.   Kayla Guerrero , Thank you for taking time to come for your Medicare Wellness Visit. I appreciate your ongoing commitment to your health goals. Please review the following  plan we discussed and let me know if I can assist you in the future.   These are the goals we discussed: Goals    None      This is a list of the screening recommended for you and due dates:  Health Maintenance  Topic Date Due  . Flu Shot  04/29/2017  . Tetanus Vaccine  12/11/2020  . DEXA scan (bone density measurement)  Completed  . Pneumonia vaccines  Completed   You got flu and pneumovax booster today. Mammogram is due in October, and colon cancer screening next year (due to family history).  I recommend getting the new shingles vaccine (Shingrix). You will need to check with your insurance to see if it is covered, and if covered by Medicare Part D, you need to get from the pharmacy rather than our office.  It is a series of 2 injections, spaced 2 months apart.   Please do your home exercises daily.  If you notice ongoing problems with strength, walking or balance, still having some trips/falls, please let us know and we will send you back for additional physical therapy.   Follow up with Dr. Wynelle Link for your knee pain.

## 2017-06-12 LAB — CYTOLOGY - PAP: Diagnosis: NEGATIVE

## 2017-06-12 LAB — LIPID PANEL
Cholesterol: 223 mg/dL — ABNORMAL HIGH (ref <200)
HDL: 86 mg/dL (ref >50)
LDL Cholesterol (Calc): 117 mg/dL (calc) — ABNORMAL HIGH
NON-HDL CHOLESTEROL (CALC): 137 mg/dL — AB (ref <130)
TRIGLYCERIDES: 94 mg/dL (ref <150)
Total CHOL/HDL Ratio: 2.6 (calc) (ref <5.0)

## 2017-06-12 LAB — GLUCOSE, RANDOM: GLUCOSE: 92 mg/dL (ref 65–99)

## 2017-06-12 LAB — TSH: TSH: 3.15 mIU/L (ref 0.40–4.50)

## 2017-06-15 ENCOUNTER — Telehealth: Payer: Self-pay | Admitting: *Deleted

## 2017-06-15 NOTE — Telephone Encounter (Signed)
I recommend either Dr. Amedeo Plenty at [facility], or the folks at the [facility], or his partners)

## 2017-06-15 NOTE — Telephone Encounter (Signed)
Patient advised.

## 2017-06-15 NOTE — Telephone Encounter (Signed)
When patient was in the other day for AWV-she said that you guys never got to talk about her trigger finger.(she did mention and I put it in the visit info). She was calling to ask if you had someone that you would recommend for her to see. She said she has this years ago but it's been 15-20 years since she has seen someone for this.

## 2017-06-17 ENCOUNTER — Telehealth: Payer: Self-pay | Admitting: Family Medicine

## 2017-06-17 NOTE — Telephone Encounter (Signed)
Pt called & asked that her Rx for Duloxetine & Levothyroxine be cancelled at [facility] due to cost & called into [facility].  This was taken care of.

## 2017-06-22 ENCOUNTER — Other Ambulatory Visit: Payer: Self-pay | Admitting: Family Medicine

## 2017-06-22 DIAGNOSIS — Z1231 Encounter for screening mammogram for malignant neoplasm of breast: Secondary | ICD-10-CM

## 2017-06-25 ENCOUNTER — Ambulatory Visit (INDEPENDENT_AMBULATORY_CARE_PROVIDER_SITE_OTHER): Payer: Medicare Other | Admitting: Family Medicine

## 2017-06-25 ENCOUNTER — Encounter: Payer: Self-pay | Admitting: Family Medicine

## 2017-06-25 ENCOUNTER — Inpatient Hospital Stay: Payer: Medicare Other | Attending: Hematology and Oncology

## 2017-06-25 VITALS — BP 130/84 | HR 80 | Ht 66.5 in | Wt 190.0 lb

## 2017-06-25 VITALS — BP 156/86 | HR 98 | Temp 98.2°F | Resp 20

## 2017-06-25 DIAGNOSIS — E538 Deficiency of other specified B group vitamins: Secondary | ICD-10-CM | POA: Diagnosis not present

## 2017-06-25 DIAGNOSIS — Z95828 Presence of other vascular implants and grafts: Secondary | ICD-10-CM

## 2017-06-25 DIAGNOSIS — Z5181 Encounter for therapeutic drug level monitoring: Secondary | ICD-10-CM | POA: Diagnosis not present

## 2017-06-25 DIAGNOSIS — R030 Elevated blood-pressure reading, without diagnosis of hypertension: Secondary | ICD-10-CM

## 2017-06-25 DIAGNOSIS — R5383 Other fatigue: Secondary | ICD-10-CM | POA: Diagnosis not present

## 2017-06-25 DIAGNOSIS — C541 Malignant neoplasm of endometrium: Secondary | ICD-10-CM | POA: Diagnosis not present

## 2017-06-25 MED ORDER — HEPARIN SOD (PORK) LOCK FLUSH 100 UNIT/ML IV SOLN
500.0000 [IU] | Freq: Once | INTRAVENOUS | Status: AC
  Administered 2017-06-25: 500 [IU]
  Filled 2017-06-25: qty 5

## 2017-06-25 MED ORDER — SODIUM CHLORIDE 0.9% FLUSH
10.0000 mL | Freq: Once | INTRAVENOUS | Status: AC
  Administered 2017-06-25: 10 mL
  Filled 2017-06-25: qty 10

## 2017-06-25 NOTE — Progress Notes (Signed)
Chief Complaint  Patient presents with  . Hypertension    elevated blood pressure at flush this morning. First reading was 156/86 and second was145/93.   Went to cancer center for port flush this morning and her blood pressure was noted to be high.  She has been feeling a little sleepy/lethargic.  Not having headaches, dizziness, chest pain.  She hadn't been rushed, stressed or worried. She denies pain or other cause for higher blood pressure (no decongestant use, etc).  She does report eating chicken pot pie at cafeteria yesterday for dinner, which was likely salty.  She cut back some on the duloxetine--skipping every 3rd day over the last week, to see if that helped with her fatigue.  She hasn't noticed any improvement.  Up frequently at night to void, more often early in the night.  Up about 4x. Rare restless legs, sometimes keeps her from falling asleep, but doesn't wake her up.   B12 level was noted to be somewhat low in May, level of 267.  She has been taking a B complex daily since then.  Lab Results  Component Value Date   WBC 4.0 03/16/2017   HGB 11.7 03/16/2017   HCT 35.8 03/16/2017   MCV 89.1 03/16/2017   PLT 306 03/16/2017   PMH, PSH, SH reviewed Recent labs reviewed  Outpatient Encounter Prescriptions as of 06/25/2017  Medication Sig Note  . Alpha-Lipoic Acid 600 MG CAPS Take 1 capsule by mouth daily.   [name] b complex vitamins capsule Take 1 capsule by mouth daily.   . Cholecalciferol (VITAMIN D) 2000 UNITS tablet Take 2,000 Units by mouth daily.   . DULoxetine (CYMBALTA) 30 MG capsule Take 1 capsule (30 mg total) by mouth daily. 06/25/2017: Has been skipping every 3rd day due to fatigue  . glucosamine-chondroitin 500-400 MG tablet Take 1 tablet by mouth 2 (two) times daily.    [name] levothyroxine (SYNTHROID, LEVOTHROID) 88 MCG tablet TAKE ONE TABLET BY MOUTH ONCE DAILY BEFORE  BREAKFAST.   [name] lidocaine-prilocaine (EMLA) cream  10/09/2016: Received from: External Pharmacy  .  Magnesium 400 MG CAPS Take 1 capsule by mouth 2 (two) times daily.    . Multiple Vitamin (MULTI-VITAMIN) tablet Take 1 tablet by mouth daily.     [name] senna-docusate (SENOKOT S) 8.6-50 MG tablet Take 1 tablet by mouth 2 (two) times daily as needed for mild constipation.   . terbinafine (LAMISIL) 250 MG tablet Take 1 tablet (250 mg total) by mouth daily.   . Wheat Dextrin (BENEFIBER DRINK MIX PO) Take 1 scoop by mouth 2 (two) times daily.    [name] acetaminophen (TYLENOL) 650 MG CR tablet Take 650 mg by mouth every 8 (eight) hours as needed for pain. 06/11/2017: Using BID prn  . ferrous sulfate 325 (65 FE) MG tablet Take 1 tablet (325 mg total) by mouth 2 (two) times daily with a meal. (Patient not taking: Reported on 06/25/2017)   . HYDROcodone-acetaminophen (NORCO/VICODIN) 5-325 MG tablet Take 1 tablet by mouth every 6 (six) hours as needed for moderate pain. (Patient not taking: Reported on 06/11/2017)   . naproxen sodium (ANAPROX) 220 MG tablet Take 220 mg by mouth 2 (two) times daily as needed (pain).  06/11/2017: Uses prn, not frequently  . polyethylene glycol (MIRALAX / GLYCOLAX) packet Take 17 g by mouth 2 (two) times daily as needed for mild constipation.     Facility-Administered Encounter Medications as of 06/25/2017  Medication  . influenza  inactive virus vaccine (FLUZONE/FLUARIX) injection 0.5 mL  Allergies  Allergen Reactions  . Levaquin [Levofloxacin Hemihydrate] Other (See Comments)  . Promethazine Hcl Other (See Comments)    fainting  . Sulfa Drugs Cross Reactors Swelling    ROS:  No fever, chills, URI symptoms, headache, dizziness, chest pain, shortness of breath, GI or GU complaints (other than frequency per HPI). No bleeding, bruising, rash.  +Fatigue. Denies depression.  +interrupted sleep.  PHYSICAL EXAM:  BP 130/84 (BP Location: Right Arm, Patient Position: Sitting, Cuff Size: Normal)   Pulse 80   Ht 5' 6.5" (1.689 m)   Wt 190 lb (86.2 kg)   BMI 30.21 kg/m   140/74 on  repeat by MD Well appearing, pleasant female, appears somewhat tired, accompanied by her husband HEENT: conjunctiva and sclera are clear Neck: no lymphadenopathy or mass Heart: regular rate and rhythm Lungs: clear bilaterally Extremities: no edema Skin: normal turgor, no rash  ASSESSMENT/PLAN:  Elevated blood pressure reading - normal on repeat, suspect related to higher sodium intake, vs poor sleep. Normal here, reassured  Medication monitoring encounter - Plan: Vitamin B12, Comprehensive metabolic panel, CBC with Differential/Platelet, VITAMIN D 25 Hydroxy (Vit-D Deficiency, Fractures)  B12 deficiency - mildly low in the past; has been on oral supplement. Recheck  - Plan: Vitamin B12, CBC with Differential/Platelet  Fatigue, unspecified type - Ddx reviewed in detail. Interrupted sleep is likely a contributing factor. Recheck labs. Consider sleep study - Plan: Vitamin B12, Comprehensive metabolic panel, CBC with Differential/Platelet, VITAMIN D 25 Hydroxy (Vit-D Deficiency, Fractures)   Recheck B12 level (now has been taking B-complex x 3-4 months). CBC, c-met Resume daily duloxetine (no effect on energy with slightly lower dose, and suspect eventually moods/neuropathy will worsen)  Low sodium diet reviewed; regular exercise.  25 min visit, more than 1/2 spent counseling.

## 2017-06-25 NOTE — Patient Instructions (Addendum)
We are rechecking some bloodwork to look for other causes of fatigue. If they are normal, then we need to look at your sleep. If you don't get enough, or have very frequent interruptions to your sleep, the poor sleep quality can affect your daytime energy. We can consider doing a sleep study.  Try and push past the fatigue and stay active. Continue to try and limit fluids and caffeine in the afternoon in order to not have bathroom trips be part of your sleep disruption.  I'm not worried about your blood pressure. It was okay here. Likely the sodium from your dinner last night may contribute some to the higher blood pressure seen today. In general, try and follow a low sodium diet, to help keep the blood pressure down.   DASH Eating Plan DASH stands for "Dietary Approaches to Stop Hypertension." The DASH eating plan is a healthy eating plan that has been shown to reduce high blood pressure (hypertension). It may also reduce your risk for type 2 diabetes, heart disease, and stroke. The DASH eating plan may also help with weight loss. What are tips for following this plan? General guidelines  Avoid eating more than 2,300 mg (milligrams) of salt (sodium) a day. If you have hypertension, you may need to reduce your sodium intake to 1,500 mg a day.  Limit alcohol intake to no more than 1 drink a day for nonpregnant women and 2 drinks a day for men. One drink equals 12 oz of beer, 5 oz of wine, or 1 oz of hard liquor.  Work with your health care provider to maintain a healthy body weight or to lose weight. Ask what an ideal weight is for you.  Get at least 30 minutes of exercise that causes your heart to beat faster (aerobic exercise) most days of the week. Activities may include walking, swimming, or biking.  Work with your health care provider or diet and nutrition specialist (dietitian) to adjust your eating plan to your individual calorie needs. Reading food labels  Check food labels for  the amount of sodium per serving. Choose foods with less than 5 percent of the Daily Value of sodium. Generally, foods with less than 300 mg of sodium per serving fit into this eating plan.  To find whole grains, look for the word "whole" as the first word in the ingredient list. Shopping  Buy products labeled as "low-sodium" or "no salt added."  Buy fresh foods. Avoid canned foods and premade or frozen meals. Cooking  Avoid adding salt when cooking. Use salt-free seasonings or herbs instead of table salt or sea salt. Check with your health care provider or pharmacist before using salt substitutes.  Do not fry foods. Cook foods using healthy methods such as baking, boiling, grilling, and broiling instead.  Cook with heart-healthy oils, such as olive, canola, soybean, or sunflower oil. Meal planning   Eat a balanced diet that includes: ? 5 or more servings of fruits and vegetables each day. At each meal, try to fill half of your plate with fruits and vegetables. ? Up to 6-8 servings of whole grains each day. ? Less than 6 oz of lean meat, poultry, or fish each day. A 3-oz serving of meat is about the same size as a deck of cards. One egg equals 1 oz. ? 2 servings of low-fat dairy each day. ? A serving of nuts, seeds, or beans 5 times each week. ? Heart-healthy fats. Healthy fats called Omega-3 fatty acids are found  in foods such as flaxseeds and coldwater fish, like sardines, salmon, and mackerel.  Limit how much you eat of the following: ? Canned or prepackaged foods. ? Food that is high in trans fat, such as fried foods. ? Food that is high in saturated fat, such as fatty meat. ? Sweets, desserts, sugary drinks, and other foods with added sugar. ? Full-fat dairy products.  Do not salt foods before eating.  Try to eat at least 2 vegetarian meals each week.  Eat more home-cooked food and less restaurant, buffet, and fast food.  When eating at a restaurant, ask that your food be  prepared with less salt or no salt, if possible. What foods are recommended? The items listed may not be a complete list. Talk with your dietitian about what dietary choices are best for you. Grains Whole-grain or whole-wheat bread. Whole-grain or whole-wheat pasta. Brown rice. [place]. Bulgur. Whole-grain and low-sodium cereals. Pita bread. Low-fat, low-sodium crackers. Whole-wheat flour tortillas. Vegetables Fresh or frozen vegetables (raw, steamed, roasted, or grilled). Low-sodium or reduced-sodium tomato and vegetable juice. Low-sodium or reduced-sodium tomato sauce and tomato paste. Low-sodium or reduced-sodium canned vegetables. Fruits All fresh, dried, or frozen fruit. Canned fruit in natural juice (without added sugar). Meat and other protein foods Skinless chicken or [place]. Ground chicken or [place]. Pork with fat trimmed off. Fish and seafood. Egg whites. Dried beans, peas, or lentils. Unsalted nuts, nut butters, and seeds. Unsalted canned beans. Lean cuts of beef with fat trimmed off. Low-sodium, lean deli meat. Dairy Low-fat (1%) or fat-free (skim) milk. Fat-free, low-fat, or reduced-fat cheeses. Nonfat, low-sodium ricotta or cottage cheese. Low-fat or nonfat yogurt. Low-fat, low-sodium cheese. Fats and oils Soft margarine without trans fats. Vegetable oil. Low-fat, reduced-fat, or light mayonnaise and salad dressings (reduced-sodium). Canola, safflower, olive, soybean, and sunflower oils. Avocado. Seasoning and other foods Herbs. Spices. Seasoning mixes without salt. Unsalted popcorn and pretzels. Fat-free sweets. What foods are not recommended? The items listed may not be a complete list. Talk with your dietitian about what dietary choices are best for you. Grains Baked goods made with fat, such as croissants, muffins, or some breads. Dry pasta or rice meal packs. Vegetables Creamed or fried vegetables. Vegetables in a cheese sauce. Regular canned vegetables (not  low-sodium or reduced-sodium). Regular canned tomato sauce and paste (not low-sodium or reduced-sodium). Regular tomato and vegetable juice (not low-sodium or reduced-sodium). Angie Fava. Olives. Fruits Canned fruit in a light or heavy syrup. Fried fruit. Fruit in cream or butter sauce. Meat and other protein foods Fatty cuts of meat. Ribs. Fried meat. Berniece Salines. Sausage. Bologna and other processed lunch meats. Salami. Fatback. Hotdogs. Bratwurst. Salted nuts and seeds. Canned beans with added salt. Canned or smoked fish. Whole eggs or egg yolks. Chicken or [place] with skin. Dairy Whole or 2% milk, cream, and half-and-half. Whole or full-fat cream cheese. Whole-fat or sweetened yogurt. Full-fat cheese. Nondairy creamers. Whipped toppings. Processed cheese and cheese spreads. Fats and oils Butter. Stick margarine. Lard. Shortening. Ghee. Bacon fat. Tropical oils, such as coconut, palm kernel, or palm oil. Seasoning and other foods Salted popcorn and pretzels. Onion salt, garlic salt, seasoned salt, table salt, and sea salt. Worcestershire sauce. Tartar sauce. Barbecue sauce. Teriyaki sauce. Soy sauce, including reduced-sodium. Steak sauce. Canned and packaged gravies. Fish sauce. Oyster sauce. Cocktail sauce. Horseradish that you find on the shelf. Ketchup. Mustard. Meat flavorings and tenderizers. Bouillon cubes. Hot sauce and Tabasco sauce. Premade or packaged marinades. Premade or packaged taco seasonings. Relishes.  Regular salad dressings. Where to find more information:  National Heart, Lung, and [place]: https://wilson-eaton.com/  American Heart Association: www.heart.org Summary  The DASH eating plan is a healthy eating plan that has been shown to reduce high blood pressure (hypertension). It may also reduce your risk for type 2 diabetes, heart disease, and stroke.  With the DASH eating plan, you should limit salt (sodium) intake to 2,300 mg a day. If you have hypertension, you may need to reduce  your sodium intake to 1,500 mg a day.  When on the DASH eating plan, aim to eat more fresh fruits and vegetables, whole grains, lean proteins, low-fat dairy, and heart-healthy fats.  Work with your health care provider or diet and nutrition specialist (dietitian) to adjust your eating plan to your individual calorie needs. This information is not intended to replace advice given to you by your health care provider. Make sure you discuss any questions you have with your health care provider. Document Released: 09/04/2011 Document Revised: 09/08/2016 Document Reviewed: 09/08/2016 Elsevier Interactive Patient Education  2017 [employer].

## 2017-06-26 LAB — CBC WITH DIFFERENTIAL/PLATELET
BASOS ABS: 40 {cells}/uL (ref 0–200)
Basophils Relative: 1 %
Eosinophils Absolute: 160 cells/uL (ref 15–500)
Eosinophils Relative: 4 %
HCT: 41.2 % (ref 35.0–45.0)
HEMOGLOBIN: 13.6 g/dL (ref 11.7–15.5)
LYMPHS ABS: 1604 {cells}/uL (ref 850–3900)
MCH: 28.8 pg (ref 27.0–33.0)
MCHC: 33 g/dL (ref 32.0–36.0)
MCV: 87.1 fL (ref 80.0–100.0)
MPV: 10.9 fL (ref 7.5–12.5)
Monocytes Relative: 6 %
NEUTROS PCT: 48.9 %
Neutro Abs: 1956 cells/uL (ref 1500–7800)
PLATELETS: 263 10*3/uL (ref 140–400)
RBC: 4.73 10*6/uL (ref 3.80–5.10)
RDW: 12.9 % (ref 11.0–15.0)
TOTAL LYMPHOCYTE: 40.1 %
WBC mixed population: 240 cells/uL (ref 200–950)
WBC: 4 10*3/uL (ref 3.8–10.8)

## 2017-06-26 LAB — COMPREHENSIVE METABOLIC PANEL
AG Ratio: 1.3 (calc) (ref 1.0–2.5)
ALKALINE PHOSPHATASE (APISO): 59 U/L (ref 33–130)
ALT: 7 U/L (ref 6–29)
AST: 16 U/L (ref 10–35)
Albumin: 3.7 g/dL (ref 3.6–5.1)
BILIRUBIN TOTAL: 0.3 mg/dL (ref 0.2–1.2)
BUN: 15 mg/dL (ref 7–25)
CALCIUM: 8.6 mg/dL (ref 8.6–10.4)
CO2: 23 mmol/L (ref 20–32)
Chloride: 105 mmol/L (ref 98–110)
Creat: 0.86 mg/dL (ref 0.60–0.88)
GLUCOSE: 94 mg/dL (ref 65–99)
Globulin: 2.8 g/dL (calc) (ref 1.9–3.7)
Potassium: 4.5 mmol/L (ref 3.5–5.3)
Sodium: 140 mmol/L (ref 135–146)
TOTAL PROTEIN: 6.5 g/dL (ref 6.1–8.1)

## 2017-06-26 LAB — VITAMIN B12: Vitamin B-12: 317 pg/mL (ref 200–1100)

## 2017-06-26 LAB — VITAMIN D 25 HYDROXY (VIT D DEFICIENCY, FRACTURES): VIT D 25 HYDROXY: 49 ng/mL (ref 30–100)

## 2017-07-15 ENCOUNTER — Telehealth: Payer: Self-pay | Admitting: Family Medicine

## 2017-07-15 NOTE — Telephone Encounter (Signed)
Spoke with [name]- she was aware paper complete, and fax to [name] [phone]. Kayla Guerrero

## 2017-07-15 NOTE — Telephone Encounter (Signed)
Pt dropped off form to be completed. Please complete and fax to number on form then advise pt that originals are ready to be picked up.

## 2017-07-15 NOTE — Telephone Encounter (Signed)
Form signed.

## 2017-07-21 ENCOUNTER — Encounter: Payer: Self-pay | Admitting: Podiatry

## 2017-07-21 ENCOUNTER — Ambulatory Visit (INDEPENDENT_AMBULATORY_CARE_PROVIDER_SITE_OTHER): Payer: Medicare Other | Admitting: Podiatry

## 2017-07-21 ENCOUNTER — Ambulatory Visit: Payer: Medicare Other | Admitting: Podiatry

## 2017-07-21 DIAGNOSIS — Z79899 Other long term (current) drug therapy: Secondary | ICD-10-CM

## 2017-07-21 DIAGNOSIS — L603 Nail dystrophy: Secondary | ICD-10-CM

## 2017-07-21 LAB — HEPATIC FUNCTION PANEL
AG RATIO: 1.6 (calc) (ref 1.0–2.5)
ALBUMIN MSPROF: 3.7 g/dL (ref 3.6–5.1)
ALT: 7 U/L (ref 6–29)
AST: 15 U/L (ref 10–35)
Alkaline phosphatase (APISO): 53 U/L (ref 33–130)
BILIRUBIN INDIRECT: 0.3 mg/dL (ref 0.2–1.2)
Bilirubin, Direct: 0 mg/dL (ref 0.0–0.2)
GLOBULIN: 2.3 g/dL (ref 1.9–3.7)
TOTAL PROTEIN: 6 g/dL — AB (ref 6.1–8.1)
Total Bilirubin: 0.3 mg/dL (ref 0.2–1.2)

## 2017-07-21 MED ORDER — TERBINAFINE HCL 250 MG PO TABS: 250.0000 mg | ORAL_TABLET | Freq: Every day | ORAL | 0 refills | Status: DC

## 2017-07-21 NOTE — Progress Notes (Signed)
She presents today for follow-up of her onychomycosis. States that it seems to be doing a little bit better.  Objective: Vital signs are stable she is alert and oriented 3. Toenails appear to be growing out by approximately 5 mm from the cuticle.  Assessment: Onychomycosis.  Plan: Long-term therapy with Lamisil. 250 mg tablets once daily for 3 months. We are requesting another liver profile. Should this come back abnormal I will notify her immediately. Otherwise I will see her in 3 months.

## 2017-07-23 ENCOUNTER — Telehealth: Payer: Self-pay | Admitting: Podiatry

## 2017-07-23 ENCOUNTER — Telehealth: Payer: Self-pay | Admitting: *Deleted

## 2017-07-23 ENCOUNTER — Ambulatory Visit
Admission: RE | Admit: 2017-07-23 | Discharge: 2017-07-23 | Disposition: A | Discharge status: Home / Self Care | Payer: Medicare Other | Source: Ambulatory Visit | Attending: Family Medicine | Admitting: Family Medicine

## 2017-07-23 DIAGNOSIS — Z1231 Encounter for screening mammogram for malignant neoplasm of breast: Secondary | ICD-10-CM

## 2017-07-23 MED ORDER — TERBINAFINE HCL 250 MG PO TABS: 250.0000 mg | ORAL_TABLET | Freq: Every day | ORAL | 0 refills | Status: DC

## 2017-07-23 NOTE — Telephone Encounter (Signed)
I told pt that if the terbinafine was sent to the [facility] it would be on their $4.00 formulary and be between $4.00 -$12.00. Pt states use the [facility] on [address].

## 2017-07-23 NOTE — Addendum Note (Signed)
Addended by: Harriett Sine D on: 07/23/2017 05:53 PM   Modules accepted: Orders

## 2017-07-23 NOTE — Telephone Encounter (Signed)
I saw Dr. Milinda Pointer on Tuesday and he wrote me a prescription and sent it to [facility]. The pharmacy did not have the full prescription ready so I was wanting to know if he could send the prescription to the [facility] on [address] My number is [phone]. Just let me know. Thank you very much.

## 2017-07-23 NOTE — Telephone Encounter (Signed)
I informed pt of Dr. Hyatt's review of results and orders. 

## 2017-07-23 NOTE — Telephone Encounter (Signed)
-----   Message from Garrel Ridgel, [place] sent at 07/22/2017  6:27 AM EDT ----- Blood work is ok and may continue medication.

## 2017-08-06 ENCOUNTER — Inpatient Hospital Stay: Payer: Medicare Other | Attending: Hematology and Oncology

## 2017-08-06 DIAGNOSIS — Z452 Encounter for adjustment and management of vascular access device: Secondary | ICD-10-CM | POA: Diagnosis not present

## 2017-08-06 DIAGNOSIS — Z95828 Presence of other vascular implants and grafts: Secondary | ICD-10-CM

## 2017-08-06 DIAGNOSIS — C541 Malignant neoplasm of endometrium: Secondary | ICD-10-CM | POA: Diagnosis not present

## 2017-08-06 MED ORDER — SODIUM CHLORIDE 0.9% FLUSH
10.0000 mL | Freq: Once | INTRAVENOUS | Status: AC
  Administered 2017-08-06: 10 mL
  Filled 2017-08-06: qty 10

## 2017-08-06 MED ORDER — HEPARIN SOD (PORK) LOCK FLUSH 100 UNIT/ML IV SOLN
500.0000 [IU] | Freq: Once | INTRAVENOUS | Status: AC
  Administered 2017-08-06: 500 [IU]
  Filled 2017-08-06: qty 5

## 2017-09-03 ENCOUNTER — Encounter: Payer: Self-pay | Admitting: Radiation Oncology

## 2017-09-03 ENCOUNTER — Other Ambulatory Visit: Payer: Self-pay

## 2017-09-03 ENCOUNTER — Ambulatory Visit
Admission: RE | Admit: 2017-09-03 | Discharge: 2017-09-03 | Disposition: A | Discharge status: Home / Self Care | Payer: Medicare Other | Source: Ambulatory Visit | Attending: Radiation Oncology | Admitting: Radiation Oncology

## 2017-09-03 DIAGNOSIS — Z08 Encounter for follow-up examination after completed treatment for malignant neoplasm: Secondary | ICD-10-CM | POA: Insufficient documentation

## 2017-09-03 DIAGNOSIS — Z79899 Other long term (current) drug therapy: Secondary | ICD-10-CM | POA: Insufficient documentation

## 2017-09-03 DIAGNOSIS — Z8542 Personal history of malignant neoplasm of other parts of uterus: Secondary | ICD-10-CM | POA: Diagnosis not present

## 2017-09-03 DIAGNOSIS — C541 Malignant neoplasm of endometrium: Secondary | ICD-10-CM

## 2017-09-03 NOTE — Progress Notes (Signed)
Kayla Guerrero is here for follow up.  She denies having any pain, bladder issues or vaginal bleeding/discharge.  She reports having trouble with constipation with her last bm 2 days ago.  She is taking miralax and sennakot.  She reports her energy level is better.  BP 129/80 (BP Location: Right Arm, Patient Position: Sitting)   Pulse 87   Temp 98 F (36.7 C) (Oral)   Ht 5' 6.5" (1.689 m)   Wt 197 lb 6.4 oz (89.5 kg)   SpO2 97%   BMI 31.38 kg/m    Wt Readings from Last 3 Encounters:  09/03/17 197 lb 6.4 oz (89.5 kg)  06/25/17 190 lb (86.2 kg)  06/11/17 189 lb 6.4 oz (85.9 kg)

## 2017-09-03 NOTE — Progress Notes (Signed)
Radiation Oncology         [phone] ________________________________  Name: Kayla Guerrero MRN: [id number]  Date: 09/03/2017  DOB: [birth year]  Follow-Up Visit Note  CC: Kayla Ohara, MD  Kayla Levan, MD    ICD-10-CM   1. Malignant neoplasm of endometrium [facility]) C54.1     Diagnosis:   82 y.o. 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  Narrative:  The patient returns today for routine follow-up. She missed her last follow-up in May due to complications resulting from an abscess. She denies having any pain, bladder issues, or vaginal bleeding/discharge. She reports having trouble with constipation during her last bowel movement two days ago. She reports some rectal bleeding form having hemorrhoids. She is taking miralax and sennakot. She reports her energy level is not completely improved but is better, and she has resumed her normal exercise routine. She reports she used her vaginal dilator for a while but has not been using it lately.  ALLERGIES:  is allergic to levaquin [levofloxacin hemihydrate]; promethazine hcl; and sulfa drugs cross reactors.  Meds: Current Outpatient Medications  Medication Sig Dispense Refill  . Alpha-Lipoic Acid 600 MG CAPS Take 1 capsule by mouth daily.    [name] b complex vitamins capsule Take 1 capsule by mouth daily.    . Cholecalciferol (VITAMIN D) 2000 UNITS tablet Take 2,000 Units by mouth daily.    . DULoxetine (CYMBALTA) 30 MG capsule Take 1 capsule (30 mg total) by mouth daily. 90 capsule 3  . ferrous sulfate 325 (65 FE) MG tablet Take 1 tablet (325 mg total) by mouth 2 (two) times daily with a meal. 60 tablet 0  . glucosamine-chondroitin 500-400 MG tablet Take 1 tablet by mouth 2 (two) times daily.     [name] levothyroxine  (SYNTHROID, LEVOTHROID) 88 MCG tablet TAKE ONE TABLET BY MOUTH ONCE DAILY BEFORE  BREAKFAST. 90 tablet 3  . lidocaine-prilocaine (EMLA) cream     . Magnesium 400 MG CAPS Take 1 capsule by mouth 2 (two) times daily.     . Melatonin 1 MG TABS Take by mouth.    . Multiple Vitamin (MULTI-VITAMIN) tablet Take 1 tablet by mouth daily.      . naproxen sodium (ANAPROX) 220 MG tablet Take 220 mg by mouth 2 (two) times daily as needed (pain).     . polyethylene glycol (MIRALAX / GLYCOLAX) packet Take 17 g by mouth 2 (two) times daily as needed for mild constipation.     . senna-docusate (SENOKOT S) 8.6-50 MG tablet Take 1 tablet by mouth 2 (two) times daily as needed for mild constipation.    . terbinafine (LAMISIL) 250 MG tablet Take 1 tablet (250 mg total) by mouth daily. 90 tablet 0  . vitamin B-12 (CYANOCOBALAMIN) 1000 MCG tablet Take 1,000 mcg by mouth daily.    [name] acetaminophen (TYLENOL) 650 MG CR tablet Take 650 mg by mouth every 8 (eight) hours as needed for pain.     No current facility-administered medications for this encounter.    Facility-Administered Medications Ordered in Other Encounters  Medication Dose Route Frequency Provider Last Rate Last Dose  . influenza  inactive virus vaccine (FLUZONE/FLUARIX) injection 0.5 mL  0.5 mL Intramuscular Once Kayla Ohara, MD       Review of  Systems: REVIEW OF SYSTEMS: A 10+ POINT REVIEW OF SYSTEMS WAS OBTAINED including neurology, dermatology, psychiatry, cardiac, respiratory, lymph, extremities, GI, GU, musculoskeletal, constitutional, reproductive, HEENT. All pertinent positives are noted in the HPI. All others are negative.  Physical Findings: The patient is in no acute distress. Patient is alert and oriented.  height is 5' 6.5" (1.689 m) and weight is 197 lb 6.4 oz (89.5 kg). Her oral temperature is 98 F (36.7 C). Her blood pressure is 129/80 and her pulse is 87. Her oxygen saturation is 97%.   Lungs are clear to auscultation bilaterally. Heart  has regular rate and rhythm. No palpable cervical, supraclavicular, or axillary adenopathy. Abdomen soft, non-tender, normal bowel sounds.  On pelvic examination the external genitalia were unremarkable. A speculum exam was performed. There are no mucosal lesions noted in the vaginal vault. On bimanual and rectovaginal examination there were no pelvic masses appreciated.  Lab Findings: Lab Results  Component Value Date   WBC 4.0 06/25/2017   HGB 13.6 06/25/2017   HCT 41.2 06/25/2017   MCV 87.1 06/25/2017   PLT 263 06/25/2017    Radiographic Findings: No results found.  Impression:  83 y.o. 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 No evidence of disease recurrence on clinical exam today.  Plan:  Patient will see Dr. Alycia Guerrero in 4 months with a PAP smear at that time. Return for follow up in radiation oncology in 8 months. Patient was advised to resume using her vaginal dilator.  ____________________________________  Kayla Promise, PhD, MD  This document serves as a record of services personally performed by Kayla Pray, MD. It was created on his behalf by Kayla Guerrero, a trained medical scribe. The creation of this record is based on the scribe's personal observations and the provider's statements to them. This document has been checked and approved by the attending provider.

## 2017-09-17 ENCOUNTER — Inpatient Hospital Stay: Payer: Medicare Other | Attending: Hematology and Oncology

## 2017-09-17 DIAGNOSIS — Z452 Encounter for adjustment and management of vascular access device: Secondary | ICD-10-CM | POA: Diagnosis not present

## 2017-09-17 DIAGNOSIS — C541 Malignant neoplasm of endometrium: Secondary | ICD-10-CM

## 2017-09-17 DIAGNOSIS — Z95828 Presence of other vascular implants and grafts: Secondary | ICD-10-CM

## 2017-09-17 MED ORDER — HEPARIN SOD (PORK) LOCK FLUSH 100 UNIT/ML IV SOLN
500.0000 [IU] | Freq: Once | INTRAVENOUS | Status: AC
  Administered 2017-09-17: 500 [IU]
  Filled 2017-09-17: qty 5

## 2017-09-17 MED ORDER — SODIUM CHLORIDE 0.9% FLUSH
10.0000 mL | Freq: Once | INTRAVENOUS | Status: AC
  Administered 2017-09-17: 10 mL
  Filled 2017-09-17: qty 10

## 2017-09-17 NOTE — Patient Instructions (Signed)

## 2017-10-09 ENCOUNTER — Other Ambulatory Visit: Payer: Self-pay | Admitting: Podiatry

## 2017-10-14 ENCOUNTER — Telehealth: Payer: Self-pay | Admitting: *Deleted

## 2017-10-14 NOTE — Telephone Encounter (Signed)
Pt states she would like to know why her terbinafine was denied.

## 2017-10-14 NOTE — Telephone Encounter (Signed)
Left message informing pt Dr. Milinda Pointer had prescribed therapeutic dosing of terbinafine in 06/2017 and he had wanted to see her in 3 months, that is why the terbinafine was denied.

## 2017-10-20 ENCOUNTER — Ambulatory Visit (INDEPENDENT_AMBULATORY_CARE_PROVIDER_SITE_OTHER): Payer: Medicare Other | Admitting: Podiatry

## 2017-10-20 DIAGNOSIS — L603 Nail dystrophy: Secondary | ICD-10-CM | POA: Diagnosis not present

## 2017-10-20 MED ORDER — TERBINAFINE HCL 250 MG PO TABS: 250.0000 mg | ORAL_TABLET | Freq: Every day | ORAL | 0 refills | Status: DC

## 2017-10-20 NOTE — Patient Instructions (Signed)
Dr. Hyatt has sent over a refill for Lamisil to your pharmacy today. The instructions on your bottle will say "take 1 tablet daily", however, he would like for you to take one pill every other day. He will follow up with you in 3 months to re-evaluate your toenails. 

## 2017-10-21 NOTE — Progress Notes (Signed)
She presents today for follow-up of her Lamisil therapy.  States that things are improving considerably.  She denies problems taking the medications.  Fever chills nausea from muscle aches or pains rashes or itching  Objective: Toenails appear to be approximately 50% grown out.  Pulses are palpable neurologic sensorium is intact no open lesions or wounds.  No signs of tinea pedis.  Assessment: Well-healing toenails.  Plan: We will continue her medication wrote a prescription today for Lamisil 250 mg tablets 1 p.o. every other day.  #30 we will follow-up with her in 3 months.

## 2017-10-29 ENCOUNTER — Inpatient Hospital Stay: Payer: Medicare Other | Attending: Hematology and Oncology

## 2017-10-29 DIAGNOSIS — Z452 Encounter for adjustment and management of vascular access device: Secondary | ICD-10-CM | POA: Diagnosis not present

## 2017-10-29 DIAGNOSIS — C541 Malignant neoplasm of endometrium: Secondary | ICD-10-CM | POA: Insufficient documentation

## 2017-10-29 DIAGNOSIS — Z95828 Presence of other vascular implants and grafts: Secondary | ICD-10-CM

## 2017-10-29 MED ORDER — HEPARIN SOD (PORK) LOCK FLUSH 100 UNIT/ML IV SOLN
500.0000 [IU] | Freq: Once | INTRAVENOUS | Status: AC
  Administered 2017-10-29: 500 [IU]
  Filled 2017-10-29: qty 5

## 2017-10-29 MED ORDER — SODIUM CHLORIDE 0.9% FLUSH
10.0000 mL | Freq: Once | INTRAVENOUS | Status: AC
  Administered 2017-10-29: 10 mL
  Filled 2017-10-29: qty 10

## 2017-11-30 ENCOUNTER — Encounter: Payer: Self-pay | Admitting: Family Medicine

## 2017-11-30 ENCOUNTER — Ambulatory Visit: Payer: Medicare Other | Admitting: Family Medicine

## 2017-11-30 VITALS — BP 138/82 | HR 80 | Temp 97.3°F | Ht 66.5 in | Wt 194.2 lb

## 2017-11-30 DIAGNOSIS — R109 Unspecified abdominal pain: Secondary | ICD-10-CM | POA: Diagnosis not present

## 2017-11-30 DIAGNOSIS — Z8542 Personal history of malignant neoplasm of other parts of uterus: Secondary | ICD-10-CM | POA: Diagnosis not present

## 2017-11-30 DIAGNOSIS — R1031 Right lower quadrant pain: Secondary | ICD-10-CM | POA: Diagnosis not present

## 2017-11-30 LAB — POCT URINALYSIS DIP (PROADVANTAGE DEVICE)
BILIRUBIN UA: NEGATIVE
BILIRUBIN UA: NEGATIVE mg/dL
Glucose, UA: NEGATIVE mg/dL
LEUKOCYTES UA: NEGATIVE
NITRITE UA: NEGATIVE
PH UA: 6.5 (ref 5.0–8.0)
Specific Gravity, Urine: 1.015
Urobilinogen, Ur: NEGATIVE

## 2017-11-30 NOTE — Progress Notes (Signed)
Chief Complaint  Patient presents with  . Abdominal Pain    for 2 weeks that come and go- no other symptoms associated with these pains. More on the lower right side. Still having lots of fatigue.    Pain is somewhat generalized over the lower abdomen, more focal on the right side.  Pain is coming/going over the last 2 weeks.  Yesterday she felt good, barely any pain.  Woke up in pain this morning.  0/10 now, was 7/10 this morning. Not treated with medications.  Not related to position, bowels, activity.  No rhyme or reason she can figure out. Not related to bowel movements or diet. Bowels are normal, no constipation or blood in the stool.  No nausea, vomiting.  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  Last CT in 02/2012 showed: IMPRESSION: Resolution of right pelvic infected fluid collection after percutaneous drainage. No further fluid is seen around the percutaneous drain and acute inflammatory changes also have nearly Resolved.   Stopped most of her OTC meds, taking just terbinafine, thyroid and cymbalta. Stopped meloxicam when her stomach started bothering her. Has chronic knee pain. Thinking about getting injections into her knees from [facility] (taking fat from abdominal cells and injecting, kind of like the stem cell injections).  PMH, PSH, SH reviewed (endometrial cancer)  Outpatient Encounter Medications as of 11/30/2017  Medication Sig Note  . DULoxetine (CYMBALTA) 30 MG capsule Take 1 capsule (30 mg total) by mouth daily. 11/30/2017: Taking daily  . levothyroxine (SYNTHROID, LEVOTHROID) 88 MCG tablet TAKE ONE TABLET BY MOUTH ONCE DAILY BEFORE  BREAKFAST.   [name] polyethylene glycol (MIRALAX / GLYCOLAX) packet Take 17 g by mouth 2 (two) times daily as needed for mild constipation.  11/30/2017: Using daily  . terbinafine (LAMISIL) 250 MG tablet Take 1 tablet (250 mg total) by mouth daily. 11/30/2017: Taking every other day  . acetaminophen (TYLENOL) 650 MG CR tablet Take  650 mg by mouth every 8 (eight) hours as needed for pain. 06/11/2017: Using BID prn  . Alpha-Lipoic Acid 600 MG CAPS Take 1 capsule by mouth daily.   [name] b complex vitamins capsule Take 1 capsule by mouth daily.   . Cholecalciferol (VITAMIN D) 2000 UNITS tablet Take 2,000 Units by mouth daily.   . ferrous sulfate 325 (65 FE) MG tablet Take 1 tablet (325 mg total) by mouth 2 (two) times daily with a meal. (Patient not taking: Reported on 11/30/2017)   . glucosamine-chondroitin 500-400 MG tablet Take 1 tablet by mouth 2 (two) times daily.    [name] lidocaine-prilocaine (EMLA) cream  10/09/2016: Received from: External Pharmacy  . Magnesium 400 MG CAPS Take 1 capsule by mouth 2 (two) times daily.    . Melatonin 1 MG TABS Take by mouth.   . Multiple Vitamin (MULTI-VITAMIN) tablet Take 1 tablet by mouth daily.     . naproxen sodium (ANAPROX) 220 MG tablet Take 220 mg by mouth 2 (two) times daily as needed (pain).  06/11/2017: Uses prn, not frequently  . senna-docusate (SENOKOT S) 8.6-50 MG tablet Take 1 tablet by mouth 2 (two) times daily as needed for mild constipation.   . vitamin B-12 (CYANOCOBALAMIN) 1000 MCG tablet Take 1,000 mcg by mouth daily.    Facility-Administered Encounter Medications as of 11/30/2017  Medication  . influenza  inactive virus vaccine (FLUZONE/FLUARIX) injection 0.5 mL   Allergies  Allergen Reactions  . Levaquin [Levofloxacin Hemihydrate] Other (See Comments)  . Promethazine Hcl Other (See Comments)  fainting  . Sulfa Drugs Cross Reactors Swelling    ROS: no fever, chills, URI symptoms, dizziness, nausea, vomiting, bowel changes, urinary complaints. +lower abdominal pain per HPI.  +bilateral knee pain. No bleeding, bruising, or other concerns.  PHYSICAL EXAM:   BP 138/82   Pulse 80   Temp (!) 97.3 F (36.3 C) (Tympanic)   Ht 5' 6.5" (1.689 m)   Wt 194 lb 3.2 oz (88.1 kg)   BMI 30.88 kg/m   Well appearing pleasant female in no distress HEENT: conjunctiva and sclera  are clear, anicteric, EOMI Neck: no lymphadenopathy, thyromegaly or mass Heart: regular rate and rhythm Lungs: clear bilaterally Back: no spinal or CVA tenderness Abdomen: normal bowel sounds. No organomegaly or mass. Diffuse mild tenderness along lower abdomen, more tender on the RLQ.  No rebound tenderness or guarding. No mass  Urine dip: Trace blood, trace protein, otherwise negative   ASSESSMENT/PLAN:  Abdominal pain, unspecified abdominal location - Plan: POCT Urinalysis DIP (Proadvantage Device), US Transvaginal Non-OB, US PELVIC COMPLETE WITH TRANSVAGINAL, CANCELED: US Pelvis Complete  RLQ abdominal pain  History of endometrial cancer   US pelvis/abdomen  F/u as scheduled in 10d, sooner prn.   If you develop fever, increasing pain, vomiting, or other new concerns please let us know.  I would consider changing to a CT scan and doing bloodwork to evaluate for infection if you're getting worse.  Let's start with taking a peek with a less invasive ultrasound first. Scar tissue is on the list of possible diagnoses (in addition to appendicitis, if you still have your appendix, recurrent cysts/lymphocele or other problems).

## 2017-11-30 NOTE — Patient Instructions (Signed)
  If you develop fever, increasing pain, vomiting, or other new concerns please let us know.  I would consider changing to a CT scan and doing bloodwork to evaluate for infection if you're getting worse.  Let's start with taking a peek with a less invasive ultrasound first. Scar tissue is on the list of possible diagnoses (in addition to appendicitis, if you still have your appendix, recurrent cysts/lymphocele or other problems).

## 2017-12-02 ENCOUNTER — Other Ambulatory Visit: Payer: Medicare Other

## 2017-12-03 ENCOUNTER — Ambulatory Visit
Admission: RE | Admit: 2017-12-03 | Discharge: 2017-12-03 | Disposition: A | Discharge status: Home / Self Care | Payer: Medicare Other | Source: Ambulatory Visit | Attending: Family Medicine | Admitting: Family Medicine

## 2017-12-03 DIAGNOSIS — R109 Unspecified abdominal pain: Secondary | ICD-10-CM

## 2017-12-09 NOTE — Progress Notes (Signed)
Chief Complaint  Patient presents with  . Hyperlipidemia    nonfasting med check. Did fall this morning and thinks you already know about it.    Patient presents for routine med check. Last night she tripped on the plastic piece that desk chair is on (over carpet)--bruised her left knee, iced it, feeing better today Not giving way,locking, able to walk and bear weight on it, just sore.  Seen last week with intermittent right sided abdominal pain.  [place] was checked (given her h/o infected lymphocele).  [place] was normal. She reports that her pain is better.  Pain in knees, legs, shoulders. Stopped meloxicam when her stomach started bothering her (recent, back to taking it now). Has chronic knee pain. Also has the neuropathy in both feet, right hand, from chemo. Tingling, "annoyance", not necessarily very painful. Tylenol Arthritis isn't helping too much.  Depression. She was started on Cymbalta in late January 2018, for both depression and neuropathy.  She had a better response in moods than with pain.  She had gone off the meds (May 2018), but restarted at 54m in late July. Moods are better, but she doesn't note much improvement in the neuropathy (both feet, right hand). Perhaps she is having less shooting pains than in the past.  Moods remain good, [place], but smaller[place]   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 She has AFO, but won't wear it.  That, in combination with the neuropathy may be contributing to trips/falls.  She has been throught PT in the past, not doing exercise, has them at home.  She has seen Dr. RNelva Bushfor epidural injection, which helped her back and leg pain. She saw Dr. AEstill Bakeshad knee injections (bilateral) earlier this year, which weren't helpful. She has been going to water aerobics--didn't go for about 2 months related to  moving, but went back this week.  Has an [facility] scheduled [facility] next [facility] from [facility] cells and injecting, kind of like the stem cell injections).   Hyperlipidemia: She used to take red yeast rice 3/day (1 in the morning, 2 at dinner) without any side effects.She stopped this prior to last cholesterol check (see results below). At that time she had also been eating more sweets, bread, some more cheese, [place] Still doesn't eat a lot of meat (mostly chicken) or eggs. +creamy sauces, ice cream. She is not fasting today. Lab Results  Component Value Date   CHOL 223 (H) 06/11/2017   HDL 86 06/11/2017   LDLCALC 117 (H) 06/11/2017   TRIG 94 06/11/2017   CHOLHDL 2.6 06/11/2017   [facility] They always have fish, sometimes is fried.  Hypothyroid--her energy isn't the same as it was, has had a rough last year. Denies changes in hair/skin/bowel (just related to chemo, hair has grown back, but straight, always has dry skin).Has some constipation, that is usually pretty controlled with daily fiber.She takes her thyroid medication first thing when she wakes up, separate from other medications. Denies any missed pills.  Lab Results  Component Value Date   TSH 3.15 06/11/2017    Endometrial Cancer:  S/p chemotherapy with 3 cycles of paclitaxel and carboplatin which was discontinued secondary to toxicity. She also underwent adjuvant vaginal cuff brachitherapy. She has residual neuropathy from chemo, somewhat improved with Cymbalta. She  developed a lymphocyst that was drained and was negative for malignancy. She was admitted for 4 days in May 2018 with gastroenteritis and resulting sepsis and infection of the lymphocyst. Still has portacath.  Gets regular flushes  PMH, PSH, SH reviewed and updated  Outpatient Encounter Medications as of 12/10/2017  Medication Sig Note  . Cholecalciferol  (VITAMIN D) 2000 UNITS tablet Take 2,000 Units by mouth daily.   . DULoxetine (CYMBALTA) 30 MG capsule Take 1 capsule (30 mg total) by mouth daily. 11/30/2017: Taking daily  . levothyroxine (SYNTHROID, LEVOTHROID) 88 MCG tablet TAKE ONE TABLET BY MOUTH ONCE DAILY BEFORE  BREAKFAST.   . Melatonin 1 MG TABS Take by mouth.   . meloxicam (MOBIC) 15 MG tablet Take 15 mg by mouth daily.   . polyethylene glycol (MIRALAX / GLYCOLAX) packet Take 17 g by mouth 2 (two) times daily as needed for mild constipation.  11/30/2017: Using daily  . terbinafine (LAMISIL) 250 MG tablet Take 1 tablet (250 mg total) by mouth daily. 11/30/2017: Taking every other day  . acetaminophen (TYLENOL) 650 MG CR tablet Take 650 mg by mouth every 8 (eight) hours as needed for pain. 06/11/2017: Using BID prn  . Alpha-Lipoic Acid 600 MG CAPS Take 1 capsule by mouth daily.   [name] b complex vitamins capsule Take 1 capsule by mouth daily.   . ferrous sulfate 325 (65 FE) MG tablet Take 1 tablet (325 mg total) by mouth 2 (two) times daily with a meal. (Patient not taking: Reported on 11/30/2017)   . glucosamine-chondroitin 500-400 MG tablet Take 1 tablet by mouth 2 (two) times daily.    [name] lidocaine-prilocaine (EMLA) cream  10/09/2016: Received from: External Pharmacy  . Magnesium 400 MG CAPS Take 1 capsule by mouth 2 (two) times daily.    . Multiple Vitamin (MULTI-VITAMIN) tablet Take 1 tablet by mouth daily.     . naproxen sodium (ANAPROX) 220 MG tablet Take 220 mg by mouth 2 (two) times daily as needed (pain).  06/11/2017: Uses prn, not frequently  . senna-docusate (SENOKOT S) 8.6-50 MG tablet Take 1 tablet by mouth 2 (two) times daily as needed for mild constipation.   . vitamin B-12 (CYANOCOBALAMIN) 1000 MCG tablet Take 1,000 mcg by mouth daily.    Facility-Administered Encounter Medications as of 12/10/2017  Medication  . influenza  inactive virus vaccine (FLUZONE/FLUARIX) injection 0.5 mL   ROS: no fever, chills, headaches, dizziness, chest  pain, shortness of breath. No URI or allergy symptoms, no nausea, vomiting, bowels are controlled.  RLQ pain has improved. No bleeding, bruising, rash, depression.  +neuropathy and left knee pain as per HPI.  PHYSICAL EXAM:  BP 112/68   Pulse 88   Ht 5' 6.5" (1.689 m)   Wt 196 lb 9.6 oz (89.2 kg)   BMI 31.26 kg/m   Well appearing, pleasant female in no distress HEENT: conjunctiva and sclera are clear, EOMI, OP clear Neck: no lymphadenopathy, thyromegaly or carotid bruit Heart: regular rate and rhythm Lungs: clear bilaterally Back: no spinal or CVA tenderness Abdomen: still mildly tender diffusely in RLQ--no rebound, guarding or mass. Normal bowel sounds. Extremities: some superficial bruising at the left knee.  No erythema, effusion, warmth or significant swelling. Psych: normal mood, affect, hygiene and groomin Neuro: alert and oriented, cranial nerves intact, normal gait.   ASSESSMENT/PLAN:  Pure hypercholesterolemia - continue low cholesterol, lowfat diet. Not fasting today--plan to recheck at next wellness visit - Plan: Lipid panel  Hypothyroidism, unspecified type - euthyroid by  history, last TSH normal; recheck at next wellness visit - Plan: TSH  Depression, major, in remission ([place])  History of endometrial cancer - Plan: CBC with Differential/Platelet, Comprehensive metabolic panel  J48 deficiency - levels have been borderline low in past, no longer taking supplement, has neuropathy. Rec restart 1040mg V12 daily. check level at AWV - Plan: CBC with Differential/Platelet, Vitamin B12  Chemotherapy-induced neuropathy (HCC) - continue duloxetine. not interested in higher doses, manageable  F/u 6 mos for AWV/CPE with labs prior CBC, c-met, lipids, TSH, B12     Please restart your home exercises to help prevent falls. Please wear your AFO (to help prevent falls). Let [place]now if your neuropathy is worsening, so that we can adjust medications.  Restart your daily B12  vitamin (10080m daily). If your neuropathy is worsening, let usKoreanow and we can check a level sooner than your next appointment.  If possible, I would prefer that you don't take the meloxicam daily.  (due to risks of affecting your kidney and your stomach).  Try using Tylenol Arthritis and/or topical medications in its place, and use it only sparingly.  If you do need to take it daily, long-term, we may want to do a blood test to check your kidneys, and put you on a preventative medication to protect your stomach (zantac or prilosec).

## 2017-12-10 ENCOUNTER — Inpatient Hospital Stay: Payer: Medicare Other | Attending: Hematology and Oncology

## 2017-12-10 ENCOUNTER — Ambulatory Visit: Payer: Medicare Other | Admitting: Family Medicine

## 2017-12-10 ENCOUNTER — Encounter: Payer: Self-pay | Admitting: Family Medicine

## 2017-12-10 ENCOUNTER — Encounter: Payer: Medicare Other | Admitting: Family Medicine

## 2017-12-10 VITALS — BP 112/68 | HR 88 | Ht 66.5 in | Wt 196.6 lb

## 2017-12-10 DIAGNOSIS — Z452 Encounter for adjustment and management of vascular access device: Secondary | ICD-10-CM | POA: Insufficient documentation

## 2017-12-10 DIAGNOSIS — E7800 Pure hypercholesterolemia, unspecified: Secondary | ICD-10-CM | POA: Diagnosis not present

## 2017-12-10 DIAGNOSIS — E538 Deficiency of other specified B group vitamins: Secondary | ICD-10-CM

## 2017-12-10 DIAGNOSIS — G620 Drug-induced polyneuropathy: Secondary | ICD-10-CM | POA: Diagnosis not present

## 2017-12-10 DIAGNOSIS — Z95828 Presence of other vascular implants and grafts: Secondary | ICD-10-CM

## 2017-12-10 DIAGNOSIS — T451X5A Adverse effect of antineoplastic and immunosuppressive drugs, initial encounter: Secondary | ICD-10-CM | POA: Diagnosis not present

## 2017-12-10 DIAGNOSIS — E039 Hypothyroidism, unspecified: Secondary | ICD-10-CM | POA: Diagnosis not present

## 2017-12-10 DIAGNOSIS — F325 Major depressive disorder, single episode, in full remission: Secondary | ICD-10-CM

## 2017-12-10 DIAGNOSIS — C541 Malignant neoplasm of endometrium: Secondary | ICD-10-CM | POA: Insufficient documentation

## 2017-12-10 DIAGNOSIS — Z8542 Personal history of malignant neoplasm of other parts of uterus: Secondary | ICD-10-CM

## 2017-12-10 MED ORDER — SODIUM CHLORIDE 0.9% FLUSH
10.0000 mL | Freq: Once | INTRAVENOUS | Status: AC
  Administered 2017-12-10: 10 mL
  Filled 2017-12-10: qty 10

## 2017-12-10 MED ORDER — HEPARIN SOD (PORK) LOCK FLUSH 100 UNIT/ML IV SOLN
500.0000 [IU] | Freq: Once | INTRAVENOUS | Status: AC
  Administered 2017-12-10: 500 [IU]
  Filled 2017-12-10: qty 5

## 2017-12-10 NOTE — Patient Instructions (Signed)
  Please restart your home exercises to help prevent falls. Please wear your AFO (to help prevent falls). Let us know if your neuropathy is worsening, so that we can adjust medications.  Restart your daily B12 vitamin (1061mcg daily). If your neuropathy is worsening, let us know and we can check a level sooner than your next appointment.  If possible, I would prefer that you don't take the meloxicam daily.  (due to risks of affecting your kidney and your stomach).  Try using Tylenol Arthritis and/or topical medications in its place, and use it only sparingly.  If you do need to take it daily, long-term, we may want to do a blood test to check your kidneys, and put you on a preventative medication to protect your stomach (zantac or prilosec).

## 2017-12-16 ENCOUNTER — Telehealth: Payer: Self-pay | Admitting: *Deleted

## 2017-12-16 NOTE — Telephone Encounter (Signed)
Pt states she was given enough terbinafine for 2 months, but Dr. Milinda Pointer wanted to see her back in 29months.

## 2017-12-17 NOTE — Telephone Encounter (Signed)
Left message informing pt of Dr. Stephenie Acres orders and that she would be off of the medication for 1 month before her appt with him.

## 2017-12-17 NOTE — Telephone Encounter (Signed)
That is correct. She will be off of it for a month before I see her.

## 2018-01-07 ENCOUNTER — Ambulatory Visit: Payer: Medicare Other | Admitting: Hematology

## 2018-01-14 ENCOUNTER — Ambulatory Visit (INDEPENDENT_AMBULATORY_CARE_PROVIDER_SITE_OTHER): Payer: Medicare Other | Admitting: Podiatry

## 2018-01-14 ENCOUNTER — Encounter: Payer: Self-pay | Admitting: Podiatry

## 2018-01-14 DIAGNOSIS — Z79899 Other long term (current) drug therapy: Secondary | ICD-10-CM | POA: Diagnosis not present

## 2018-01-14 MED ORDER — TERBINAFINE HCL 250 MG PO TABS: 250.0000 mg | ORAL_TABLET | Freq: Every day | ORAL | 0 refills | Status: DC

## 2018-01-14 NOTE — Progress Notes (Signed)
She presents today for follow-up of her Lamisil therapy and states that is doing very well and I would like to continue my treatment if at all possible to get rid of the rest of this.  Objective: Signs are stable she is alert and oriented x3 pulses are palpable.  Nails are almost completely grown out but not quite yet.  Assessment: Onychomycosis being treated long-term with Lamisil.  Plan: At this point I wrote another prescription for 30 tablets 1 tablet every other day and I will follow-up with her in 3 months.

## 2018-01-21 ENCOUNTER — Inpatient Hospital Stay: Payer: Medicare Other | Attending: Hematology and Oncology

## 2018-01-21 DIAGNOSIS — Z452 Encounter for adjustment and management of vascular access device: Secondary | ICD-10-CM | POA: Diagnosis present

## 2018-01-21 DIAGNOSIS — Z95828 Presence of other vascular implants and grafts: Secondary | ICD-10-CM

## 2018-01-21 DIAGNOSIS — C541 Malignant neoplasm of endometrium: Secondary | ICD-10-CM

## 2018-01-21 MED ORDER — SODIUM CHLORIDE 0.9% FLUSH
10.0000 mL | Freq: Once | INTRAVENOUS | Status: AC
  Administered 2018-01-21: 10 mL
  Filled 2018-01-21: qty 10

## 2018-01-21 MED ORDER — HEPARIN SOD (PORK) LOCK FLUSH 100 UNIT/ML IV SOLN
500.0000 [IU] | Freq: Once | INTRAVENOUS | Status: AC
  Administered 2018-01-21: 500 [IU]
  Filled 2018-01-21: qty 5

## 2018-03-01 ENCOUNTER — Telehealth: Payer: Self-pay | Admitting: *Deleted

## 2018-03-01 ENCOUNTER — Telehealth: Payer: Self-pay | Admitting: Family Medicine

## 2018-03-01 NOTE — Telephone Encounter (Signed)
ok 

## 2018-03-01 NOTE — Telephone Encounter (Signed)
Patient called and is having L knee replacement done by Dr Maureen Ralphs on 04/12/18. She is requesting an order for PT be faxed to [facility] @ [phone] so she can have some PT done prior in preparation to get ready for the surgery. Is this okay?

## 2018-03-01 NOTE — Telephone Encounter (Signed)
Patient called and stated that she had called earlier today to ask for referral for PT at [facility] prior to her upcoming surgery She is aware that Dr. Tomi Bamberger is out of the office and that the request has gone back to another provider to approve Did ask why orthopedic surgeon was not doing referral, she states that he probably will do PT referral after surgery but she wants to do PT before surgery to get ready for the suegery  She is calling now because [facility] [employer] [facility] office told her that they sent over medical clearence form on 02/09/18 and she wants to know why we have not done anything with it Advised her that I could not see that we have received anything from Ortho office She asked that I call Kelly at Ortho and have them resend the form [employer] at [phone] and left message

## 2018-03-01 NOTE — Telephone Encounter (Signed)
Received fax from [facility] for surgical clearance Surgery in July but is on waiting list Not sure if she needs appt, sent form back in folder

## 2018-03-03 NOTE — Telephone Encounter (Signed)
Done

## 2018-03-04 ENCOUNTER — Other Ambulatory Visit: Payer: Self-pay | Admitting: Hematology and Oncology

## 2018-03-04 ENCOUNTER — Encounter: Payer: Self-pay | Admitting: Hematology and Oncology

## 2018-03-04 ENCOUNTER — Inpatient Hospital Stay: Payer: Medicare Other

## 2018-03-04 ENCOUNTER — Inpatient Hospital Stay: Payer: Medicare Other | Attending: Hematology and Oncology | Admitting: Hematology and Oncology

## 2018-03-04 ENCOUNTER — Encounter: Payer: Self-pay | Admitting: Oncology

## 2018-03-04 VITALS — BP 126/55 | HR 73 | Temp 98.1°F | Resp 18 | Ht 66.5 in | Wt 199.6 lb

## 2018-03-04 DIAGNOSIS — M21372 Foot drop, left foot: Secondary | ICD-10-CM | POA: Diagnosis not present

## 2018-03-04 DIAGNOSIS — Z95828 Presence of other vascular implants and grafts: Secondary | ICD-10-CM

## 2018-03-04 DIAGNOSIS — C541 Malignant neoplasm of endometrium: Secondary | ICD-10-CM | POA: Insufficient documentation

## 2018-03-04 DIAGNOSIS — D72818 Other decreased white blood cell count: Secondary | ICD-10-CM | POA: Diagnosis not present

## 2018-03-04 DIAGNOSIS — D72819 Decreased white blood cell count, unspecified: Secondary | ICD-10-CM | POA: Insufficient documentation

## 2018-03-04 DIAGNOSIS — N289 Disorder of kidney and ureter, unspecified: Secondary | ICD-10-CM

## 2018-03-04 LAB — CBC WITH DIFFERENTIAL/PLATELET
Basophils Absolute: 0 10*3/uL (ref 0.0–0.1)
Basophils Relative: 1 %
EOS ABS: 0.1 10*3/uL (ref 0.0–0.5)
EOS PCT: 3 %
HCT: 36.5 % (ref 34.8–46.6)
Hemoglobin: 12.1 g/dL (ref 11.6–15.9)
LYMPHS ABS: 1.2 10*3/uL (ref 0.9–3.3)
Lymphocytes Relative: 33 %
MCH: 29.7 pg (ref 25.1–34.0)
MCHC: 33.2 g/dL (ref 31.5–36.0)
MCV: 89.7 fL (ref 79.5–101.0)
Monocytes Absolute: 0.3 10*3/uL (ref 0.1–0.9)
Monocytes Relative: 8 %
Neutro Abs: 2.1 10*3/uL (ref 1.5–6.5)
Neutrophils Relative %: 55 %
PLATELETS: 231 10*3/uL (ref 145–400)
RBC: 4.07 MIL/uL (ref 3.70–5.45)
RDW: 13 % (ref 11.2–14.5)
WBC: 3.8 10*3/uL — AB (ref 3.9–10.3)

## 2018-03-04 LAB — COMPREHENSIVE METABOLIC PANEL
AST: 15 U/L (ref 5–34)
Albumin: 3.5 g/dL (ref 3.5–5.0)
Alkaline Phosphatase: 71 U/L (ref 40–150)
Anion gap: 7 (ref 3–11)
BILIRUBIN TOTAL: 0.3 mg/dL (ref 0.2–1.2)
BUN: 22 mg/dL (ref 7–26)
CO2: 27 mmol/L (ref 22–29)
CREATININE: 1.39 mg/dL — AB (ref 0.60–1.10)
Calcium: 8.6 mg/dL (ref 8.4–10.4)
Chloride: 104 mmol/L (ref 98–109)
GFR calc Af Amer: 40 mL/min — ABNORMAL LOW (ref >60)
GFR, EST NON AFRICAN AMERICAN: 35 mL/min — AB (ref >60)
Glucose, Bld: 87 mg/dL (ref 70–140)
Potassium: 4.1 mmol/L (ref 3.5–5.1)
Sodium: 138 mmol/L (ref 136–145)
Total Protein: 6.5 g/dL (ref 6.4–8.3)

## 2018-03-04 MED ORDER — HEPARIN SOD (PORK) LOCK FLUSH 100 UNIT/ML IV SOLN
500.0000 [IU] | Freq: Once | INTRAVENOUS | Status: AC
  Administered 2018-03-04: 500 [IU]
  Filled 2018-03-04: qty 5

## 2018-03-04 MED ORDER — SODIUM CHLORIDE 0.9% FLUSH
10.0000 mL | Freq: Once | INTRAVENOUS | Status: AC
  Administered 2018-03-04: 10 mL
  Filled 2018-03-04: qty 10

## 2018-03-04 NOTE — Assessment & Plan Note (Signed)
The cause is unknown. It could be related to prior injury from surgery She is not bothered by it

## 2018-03-04 NOTE — Assessment & Plan Note (Signed)
She has mild leukopenia but asymptomatic Her doctor has ordered B12 level to be checked soon In the meantime, she will continue vitamin B12 supplement

## 2018-03-04 NOTE — Progress Notes (Signed)
[place] OFFICE PROGRESS NOTE  Patient Care Team: Rita Ohara, MD as PCP - General (Family Medicine) Susa Day, MD as Consulting Physician (Orthopedic Surgery)  ASSESSMENT & PLAN:  Malignant neoplasm of endometrium [facility]) Clinically, she has no signs of cancer recurrence She had recent ultrasound pelvis in March which was negative I reinforced the importance of close follow-up with radiation oncologist and GYN oncologist I will get our navigator to help coordinate future appointment to see GYN oncologist I will get her port removed She does not need future follow-up to see me  Kidney lesion, native, right She had kidney lesion and was referred to see urologist last year She has appointment to see urologist again in September I will defer to them for further follow-up  Leukopenia She has mild leukopenia but asymptomatic Her doctor has ordered B12 level to be checked soon In the meantime, she will continue vitamin B12 supplement  Left foot drop The cause is unknown. It could be related to prior injury from surgery She is not bothered by it   Orders Placed This Encounter  Procedures  . IR REMOVAL TUN ACCESS W/ PORT W/O FL MOD SED    Standing Status:   Future    Standing Expiration Date:   05/05/2019    Order Specific Question:   Reason for exam:    Answer:   port not needed    Order Specific Question:   Preferred Imaging Location?    Answer:   [facility]    INTERVAL HISTORY: Please see below for problem oriented charting. She returns for further follow-up with her husband She feels well Appetite is stable No recent changes in bowel habits Denies abnormal vaginal discharge or bleeding She continues to have mild weakness affecting her left leg since surgery She is scheduled to have knee surgery soon She was seen by urologist in September 2018 for follow-up on kidney lesion  SUMMARY OF ONCOLOGIC HISTORY: Oncology History   IB USC, negative  nodes.     Malignant neoplasm of endometrium ([place])   04/11/2016 Pathology Results    Endometrium, curettage - HIGH GRADE ENDOMETRIAL CARCINOMA, SEE COMMENT. Microscopic Comment The overall appearance favors serous carcinoma.       04/23/2016 Initial Diagnosis    Patient presented to PCP with new vaginal discharge and some vaginal spotting, referred to Dr Dory Horn, whom she had known previously. D&C on 04-09-16 had high grade carcinoma favoring serous histology [phone]). She was referred to gyn oncology, saw Dr Denman George on 04-23-16.       04/30/2016 Imaging    Markedly thickened/widened endometrium consistent with known endometrial cancer. No evidence of serosal or extra uterine extension. 2. No findings to suggest metastatic disease involving the chest, abdomen or pelvis. 3. Indeterminant 12.5 mm right renal lesion, small enhancing mass versus hemorrhagic cyst. Attention on future scans is suggested. 4. Atherosclerotic calcifications involving the thoracic and abdominal aorta and branch vessels but no focal aneurysm. 5. Moderate stool throughout the colon and down into the rectum may suggest constipation.       05/06/2016 Surgery    Robotic hysterectomy and staging. IB USC, 0/11 nodes. Dispositioned to chemotherapy with paclitaxel and carboplatin x 6 with vaginal brachytherapy.      05/06/2016 Pathology Results    1. Lymph node, biopsy, right peri-aortic - ONE OF ONE LYMPH NODES NEGATIVE FOR MALIGNANCY (0/1). 2. Lymph node, biopsy, left peri-aortic - ONE OF ONE LYMPH NODES NEGATIVE FOR MALIGNANCY (0/1). 3. Lymph node,  biopsy, right pelvic - FOUR OF FOUR LYMPH NODES NEGATIVE FOR MALIGNANCY (0/4). 4. Lymph node, biopsy, left pelvic - FIVE OF FIVE LYMPH NODES NEGATIVE FOR MALIGNANCY (0/5). 5. Uterus +/- tubes/ovaries, neoplastic - UTERUS: -ENDO/MYOMETRIUM: INVASIVE MIXED ENDOMETRIOID AND SEROUS CARCINOMA, SPANNING 4 CM. TUMOR INVADES OUTER HALF OF MYOMETRIUM. LYMPHOVASCULAR INVASION  PRESENT. SEE ONCOLOGY TABLE. LEIOMYOMA. -SEROSA: UNINVOLVED. NO MALIGNANCY. - CERVIX: BENIGN SQUAMOUS AND ENDOCERVICAL MUCOSA. NO DYSPLASIA OR MALIGNANCY. - BILATERAL OVARIES: INCLUSION CYSTS. NO MALIGNANCY. - BILATERAL FALLOPIAN TUBES: UNREMARKABLE. NO MALIGNANCY.  Specimen: Uterus, cervix, bilateral ovaries and fallopian tubes, bilateral pelvic and para-aortic lymph nodes. Procedure: Hysterectomy with bilateral salpingo-oophorectomy. Lymph node sampling performed: Bilateral pelvic and para-aortic lymph node biopsies Specimen integrity: Intact. 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      05/16/2016 Imaging    Ct abdomen: Extensive subcutaneous emphysema about the abdomen and pelvis, likely postoperative. Probable abdominal pelvic wall small volume hematomas, as above. 2. Right pelvic sidewall fluid collection is likely a seroma or lymphangioma. No explanation for left lower extremity pain. 3. Minimal ill-defined fluid in the presacral space and left adnexa. 4. Small hiatal hernia. 5. An incidental finding of potential clinical significance has been found. Indeterminate right renal lesion is similar to on the recent exam. Consider further evaluation with dedicated outpatient pre and post contrast abdominal MRI      06/11/2016 - 08/22/2016 Chemotherapy    The patient completed only three cycles due to toxicity from paclitaxel and carboplatin.       07/29/2016 Imaging    CT angiogram chest: No demonstrable pulmonary embolus. Multiple foci of atherosclerotic calcification in the aorta as well as foci of coronary artery calcification.  No edema or consolidation. No lung mass or nodule lesion. No adenopathy. Gallbladder absent.  Stable mild biliary duct prominence. Stable nodular opacity right lobe of thyroid which does not meet consensus guidelines criteria for further assessment      08/06/2016 - 09/03/2016 Radiation Therapy    HDR  vaginal cuff brachytherapy x 5 fractions.      08/11/2016 Procedure    Ultrasound and fluoroscopically guided right internal jugular single lumen power port catheter insertion. Tip in the SVC/RA junction. Catheter ready for use.      09/23/2016 Imaging    Ct abdomen: No findings suspicious for metastatic disease in the abdomen or pelvis. 2. Thin-walled lobulated 6.9 x 5.8 x 5.8 cm right pelvic sidewall fluid collection, increased in size since 05/16/2016 CT study, favor a postoperative lymphocele, which demonstrates extrinsic mass-effect on the right bladder wall. 3. Indeterminate 1.2 cm posterior interpolar right renal cortical lesion, for which 4 month stability has been demonstrated, renal neoplasm not excluded. Recommend either dedicated renal protocol MRI or CT abdomen without and with IV contrast or continued attention on follow-up surveillance CT studies, as clinically warranted. 4. Additional findings include aortic atherosclerosis, moderate hiatal hernia, moderate sigmoid diverticulosis, tiny fat containing umbilical hernia, and degenerative disc disease, facet arthropathy and spondylolisthesis in the lower lumbar spine      09/30/2016 Procedure    She had successful CT-guided aspiration of right pelvic fluid collection. Approximately 90 mL yellow serous fluid was aspirated. Samples were sent for culture and cytology.      11/19/2016 Imaging    CT abdomen: No CT findings to suggest recurrent tumor, lymphadenopathy or metastatic disease. 2. Stable right-sided pelvic sidewall cyst with mass effect on the bladder. 3. Stable advanced atherosclerotic calcifications involving the aorta and iliac  arteries. 4. Moderate size hiatal hernia. 5. Status post cholecystectomy with stable intra and extrahepatic biliary dilatation      12/29/2016 Pathology Results    SOFT TISSUE, FINE NEEDLE ASPIRATION, RIGHT ADNEXAL CYST (SPECIMEN 1 OF 1 COLLECTED 09-30-2016) NO MALIGNANT CELLS IDENTIFIED.       02/17/2017 Imaging    CT abdomen 1. The previously noted thin walled cystic lesion in the right pelvis has undergone change in appearance ; it is now thick-walled and rim enhancing. There is associated inflammation and edema surrounding the pelvic sidewall lesion which has also increased in size. This suggests interim inflammation/infection of the cystic collection. 2. Urinary bladder displaced to the left by the thick-walled right pelvic sidewall cystic lesion. Wall thickening of the bladder is likely reactive but could also be due to a cystitis. Suggest correlation with urinalysis. 3. Stable intra and extrahepatic biliary dilatation post cholecystectomy 4. Stable 13 mm intermediate density right renal lesion 5. Diffuse diverticular disease of the colon without acute inflammation      02/17/2017 - 02/20/2017 Hospital Admission    She presented to the emergency department due to lower abdominal pain, diarrhea for 4 days associated with weakness, hypersomnolence following an episode of a gastroenteritis about a week ago after returning from the beach. She was told to decrease her Cymbalta to decrease somnolence. CT Abd/Pelvis was done which revealed that the previously seen thinwall cystic lesions in the right pelvis is now take wall and ring-enhancing with associated inflammation and edema surrounding the pelvic sidewall lesions which also has increased in size. This suggests inter-inflammation/infection of the cystic collection. The urinary bladder is displaced to the left by these lesions. General Surgery, IR, and GYN were consulted.She underwent IR Drain Right Pelvic Drain placement on 5/23 and is improving. Drain Fluid Cx grew out some Streptococcus Viridans and Cytology is negative. Her symptoms resolved with antibiotics       02/18/2017 Procedure    Successful CT guided placement of a 10 French all purpose drain catheter into the residual/recurrent right pelvic sidewall fluid collection with  aspiration of 50 mL of purulent fluid. Samples were sent to the laboratory for both culture and cytologic analysis.      02/25/2017 Imaging    Significant improvement in the bilobed right pelvic sidewall abscess following percutaneous drain. Stable drain catheter position. Small residual abscess is noted, measurements as above. No new abscess. Otherwise stable CT of the abdomen pelvis with contrast.      03/11/2017 Imaging    Ct abdomen: Resolution of right pelvic infected fluid collection after percutaneous drainage. No further fluid is seen around the percutaneous drain and acute inflammatory changes also have nearly resolved      12/03/2017 Imaging    No abnormality identified within the pelvis.       REVIEW OF SYSTEMS:   Constitutional: Denies fevers, chills or abnormal weight loss Eyes: Denies blurriness of vision Ears, nose, mouth, throat, and face: Denies mucositis or sore throat Respiratory: Denies cough, dyspnea or wheezes Cardiovascular: Denies palpitation, chest discomfort or lower extremity swelling Gastrointestinal:  Denies nausea, heartburn or change in bowel habits Skin: Denies abnormal skin rashes Lymphatics: Denies new lymphadenopathy or easy bruising Neurological:Denies numbness, tingling or new weaknesses Behavioral/Psych: Mood is stable, no new changes  All other systems were reviewed with the patient and are negative.  I have reviewed the past medical history, past surgical history, social history and family history with the patient and they are unchanged from previous note.  ALLERGIES:  is allergic to levofloxacin; promethazine hcl; sulfa antibiotics; sulfa drugs cross reactors; and sulfur.  MEDICATIONS:  Current Outpatient Medications  Medication Sig Dispense Refill  . b complex vitamins capsule Take 1 capsule by mouth daily.    . Cholecalciferol (VITAMIN D) 2000 UNITS tablet Take 2,000 Units by mouth daily.    . DULoxetine (CYMBALTA) 30 MG capsule Take 1  capsule (30 mg total) by mouth daily. 90 capsule 3  . levothyroxine (SYNTHROID, LEVOTHROID) 88 MCG tablet TAKE ONE TABLET BY MOUTH ONCE DAILY BEFORE  BREAKFAST. 90 tablet 3  . lidocaine-prilocaine (EMLA) cream     . Magnesium 400 MG CAPS Take 1 capsule by mouth 2 (two) times daily.     . Melatonin 1 MG TABS Take by mouth.    . naproxen sodium (ANAPROX) 220 MG tablet Take 220 mg by mouth 2 (two) times daily as needed (pain).     . polyethylene glycol (MIRALAX / GLYCOLAX) packet Take 17 g by mouth 2 (two) times daily as needed for mild constipation.     . senna-docusate (SENOKOT S) 8.6-50 MG tablet Take 1 tablet by mouth 2 (two) times daily as needed for mild constipation.    . terbinafine (LAMISIL) 250 MG tablet Take 1 tablet (250 mg total) by mouth daily. 30 tablet 0  . vitamin B-12 (CYANOCOBALAMIN) 1000 MCG tablet Take 1,000 mcg by mouth daily.     No current facility-administered medications for this visit.    Facility-Administered Medications Ordered in Other Visits  Medication Dose Route Frequency Provider Last Rate Last Dose  . influenza  inactive virus vaccine (FLUZONE/FLUARIX) injection 0.5 mL  0.5 mL Intramuscular Once Rita Ohara, MD        PHYSICAL EXAMINATION: ECOG PERFORMANCE STATUS: 0 - Asymptomatic  Vitals:   03/04/18 1047  BP: (!) 126/55  Pulse: 73  Resp: 18  Temp: 98.1 F (36.7 C)  SpO2: 97%   Filed Weights   03/04/18 1047  Weight: 199 lb 9.6 oz (90.5 kg)    GENERAL:alert, no distress and comfortable SKIN: skin color, texture, turgor are normal, no rashes or significant lesions EYES: normal, Conjunctiva are pink and non-injected, sclera clear OROPHARYNX:no exudate, no erythema and lips, buccal mucosa, and tongue normal  NECK: supple, thyroid normal size, non-tender, without nodularity LYMPH:  no palpable lymphadenopathy in the cervical, axillary or inguinal LUNGS: clear to auscultation and percussion with normal breathing effort HEART: regular rate & rhythm  and no murmurs and no lower extremity edema ABDOMEN:abdomen soft, non-tender and normal bowel sounds Musculoskeletal:no cyanosis of digits and no clubbing  NEURO: alert & oriented x 3 with fluent speech, no focal motor/sensory deficits  LABORATORY DATA:  I have reviewed the data as listed    Component Value Date/Time   NA 138 03/04/2018 1022   NA 142 03/16/2017 0852   K 4.1 03/04/2018 1022   K 4.2 03/16/2017 0852   CL 104 03/04/2018 1022   CO2 27 03/04/2018 1022   CO2 28 03/16/2017 0852   GLUCOSE 87 03/04/2018 1022   GLUCOSE 93 03/16/2017 0852   BUN 22 03/04/2018 1022   BUN 9.9 03/16/2017 0852   CREATININE 1.39 (H) 03/04/2018 1022   CREATININE 0.86 06/25/2017 1119   CREATININE 0.8 03/16/2017 0852   CALCIUM 8.6 03/04/2018 1022   CALCIUM 9.2 03/16/2017 0852   PROT 6.5 03/04/2018 1022   PROT 7.1 03/16/2017 0852   ALBUMIN 3.5 03/04/2018 1022   ALBUMIN 3.1 (L) 03/16/2017 0852   AST 15 03/04/2018 1022  AST 19 03/16/2017 0852   ALT <6 03/04/2018 1022   ALT 9 03/16/2017 0852   ALKPHOS 71 03/04/2018 1022   ALKPHOS 64 03/16/2017 0852   BILITOT 0.3 03/04/2018 1022   BILITOT 0.27 03/16/2017 0852   GFRNONAA 35 (L) 03/04/2018 1022   GFRAA 40 (L) 03/04/2018 1022    No results found for: SPEP, UPEP  Lab Results  Component Value Date   WBC 3.8 (L) 03/04/2018   NEUTROABS 2.1 03/04/2018   HGB 12.1 03/04/2018   HCT 36.5 03/04/2018   MCV 89.7 03/04/2018   PLT 231 03/04/2018      Chemistry      Component Value Date/Time   NA 138 03/04/2018 1022   NA 142 03/16/2017 0852   K 4.1 03/04/2018 1022   K 4.2 03/16/2017 0852   CL 104 03/04/2018 1022   CO2 27 03/04/2018 1022   CO2 28 03/16/2017 0852   BUN 22 03/04/2018 1022   BUN 9.9 03/16/2017 0852   CREATININE 1.39 (H) 03/04/2018 1022   CREATININE 0.86 06/25/2017 1119   CREATININE 0.8 03/16/2017 0852      Component Value Date/Time   CALCIUM 8.6 03/04/2018 1022   CALCIUM 9.2 03/16/2017 0852   ALKPHOS 71 03/04/2018 1022    ALKPHOS 64 03/16/2017 0852   AST 15 03/04/2018 1022   AST 19 03/16/2017 0852   ALT <6 03/04/2018 1022   ALT 9 03/16/2017 0852   BILITOT 0.3 03/04/2018 1022   BILITOT 0.27 03/16/2017 0852     All questions were answered. The patient knows to call the clinic with any problems, questions or concerns. No barriers to learning was detected.  I spent 15 minutes counseling the patient face to face. The total time spent in the appointment was 20 minutes and more than 50% was on counseling and review of test results  [name], MD 03/04/2018 12:56 PM

## 2018-03-04 NOTE — Assessment & Plan Note (Signed)
She had kidney lesion and was referred to see urologist last year She has appointment to see urologist again in September I will defer to them for further follow-up

## 2018-03-04 NOTE — Assessment & Plan Note (Addendum)
Clinically, she has no signs of cancer recurrence She had recent ultrasound pelvis in March which was negative I reinforced the importance of close follow-up with radiation oncologist and GYN oncologist I will get our navigator to help coordinate future appointment to see GYN oncologist I will get her port removed She does not need future follow-up to see me

## 2018-03-05 ENCOUNTER — Telehealth: Payer: Self-pay | Admitting: *Deleted

## 2018-03-05 NOTE — Telephone Encounter (Signed)
Called the patient and left a  message to call the office back to schedule an appt for November

## 2018-03-10 NOTE — Telephone Encounter (Signed)
Reviewed chart.  Last seen 11/2017 for med check (and scheduled for October for next visit/labs). Last major surgery 09/5039 without complications, and no significant changes in health in the interim (other than related to the treatment of the cancer, for which she had the surgery).  Rec routine pre-op eval.  Doesn't need OV here for form. FFO.

## 2018-03-18 NOTE — H&P (Signed)
TOTAL KNEE ADMISSION H&P  Patient is being admitted for left total knee arthroplasty.  Subjective:  Chief Complaint:left knee pain.  HPI: Kayla Guerrero, 83 y.o. female, has a history of pain and functional disability in the left knee due to arthritis and has failed non-surgical conservative treatments for greater than 12 weeks to includeNSAID's and/or analgesics, corticosteriod injections, viscosupplementation injections and stem cell treatments.  Onset of symptoms was gradual, starting several years ago with gradually worsening up until the last six months when patient's pain worsened significantly. The patient noted no past surgery on the left knee(s).  Patient currently rates pain in the left knee(s) at 8 out of 10 with activity. Patient has night pain, worsening of pain with activity and weight bearing, pain that interferes with activities of daily living and crepitus.  Patient has evidence of joint space narrowing in both the lateral and patellofemoral compartments by imaging studies. There is no active infection.  Patient Active Problem List   Diagnosis Date Noted  . Leukopenia 03/04/2018  . Sepsis ([place]) 02/17/2017  . Anemia 02/17/2017  . Hyponatremia 02/17/2017  . Hypothyroidism 02/17/2017  . Kidney lesion, native, right 01/08/2017  . Left foot drop 01/08/2017  . Leukopenia due to antineoplastic chemotherapy ([place]) 09/29/2016  . Pain in both lower extremities 09/29/2016  . Pelvic fluid collection 09/29/2016  . Port-A-Cath in place 08/27/2016  . Syncope 08/10/2016  . Sympathotonic orthostatic hypotension 08/02/2016  . Chemotherapy-induced peripheral neuropathy ([place]) 07/12/2016  . Chemotherapy induced nausea and vomiting 07/12/2016  . Chemotherapy-induced neuropathy ([place]) 07/02/2016  . Chemotherapy induced neutropenia ([place]) 06/20/2016  . Poor venous access 06/20/2016  . Osteoarthritis of multiple joints 06/06/2016  . Neuropathy 06/06/2016  . Acute postoperative pain of left  groin 05/16/2016  . Malignant neoplasm of endometrium ([place]) 04/23/2016  . GERD (gastroesophageal reflux disease) 04/06/2014  . Arthritis of knee 10/06/2013  . Macular degeneration 12/19/2012  . Left knee pain 09/15/2012  . Osteopenia 09/15/2012  . Pure hypercholesterolemia 06/11/2011  . Thyroid activity decreased 06/11/2011   Past Medical History:  Diagnosis Date  . Arthritis   . Arthritis of knee, left   . Borderline osteopenia DEXA 2006 and 2012  . Cancer [facility])    endometrial  . Cataract   . Cholesterol serum elevated   . Colon polyps   . DDD (degenerative disc disease), lumbar   . FHx: colon cancer   . GERD (gastroesophageal reflux disease)   . History of radiation therapy 08/06/16 - 09/03/16   vaginal cuff treated to 30 Gy in 5 fractions  . Hypothyroidism   . Obesity     Past Surgical History:  Procedure Laterality Date  . CATARACT EXTRACTION, BILATERAL    . CHOLECYSTECTOMY    . COLONOSCOPY  2010, 04/2013   Buccini  . IR GENERIC HISTORICAL  08/11/2016   IR FLUORO GUIDE PORT INSERTION RIGHT 08/11/2016 Greggory Keen, MD [facility]INTER[facility]  . IR GENERIC HISTORICAL  08/11/2016   IR US GUIDE VASC ACCESS RIGHT 08/11/2016 Greggory Keen, MD [facility]  . IR RADIOLOGIST EVAL & MGMT  02/25/2017  . IR RADIOLOGIST EVAL & MGMT  03/11/2017  . PTERYGIUM EXCISION    . ROBOTIC ASSISTED TOTAL HYSTERECTOMY WITH BILATERAL SALPINGO OOPHERECTOMY N/A 05/06/2016   Procedure: XI ROBOTIC ASSISTED  LAPAROSCOPIC TOTAL HYSTERECTOMY WITH BILATERAL SALPINGO OOPHORECTOMY;  Surgeon: Nancy Marus, MD;  Location: [facility] ORS;  Service: Gynecology;  Laterality: N/A;  . SENTINEL NODE BIOPSY N/A 05/06/2016   Procedure: SENTINEL NODE BIOPSY;  Surgeon: Imagene Gurney  Alycia Rossetti, MD;  Location: [facility] ORS;  Service: Gynecology;  Laterality: N/A;  . TUBAL LIGATION      No current facility-administered medications for this encounter.    Current Outpatient Medications  Medication Sig Dispense Refill Last Dose  . b complex vitamins  capsule Take 1 capsule by mouth daily.   Not Taking  . Cholecalciferol (VITAMIN D) 2000 UNITS tablet Take 2,000 Units by mouth daily.   Taking  . DULoxetine (CYMBALTA) 30 MG capsule Take 1 capsule (30 mg total) by mouth daily. 90 capsule 3 Taking  . levothyroxine (SYNTHROID, LEVOTHROID) 88 MCG tablet TAKE ONE TABLET BY MOUTH ONCE DAILY BEFORE  BREAKFAST. 90 tablet 3 Taking  . lidocaine-prilocaine (EMLA) cream    Not Taking  . Magnesium 400 MG CAPS Take 1 capsule by mouth 2 (two) times daily.    Not Taking  . Melatonin 1 MG TABS Take by mouth.   Taking  . naproxen sodium (ANAPROX) 220 MG tablet Take 220 mg by mouth 2 (two) times daily as needed (pain).    Not Taking  . polyethylene glycol (MIRALAX / GLYCOLAX) packet Take 17 g by mouth 2 (two) times daily as needed for mild constipation.    Taking  . senna-docusate (SENOKOT S) 8.6-50 MG tablet Take 1 tablet by mouth 2 (two) times daily as needed for mild constipation.   Not Taking  . terbinafine (LAMISIL) 250 MG tablet Take 1 tablet (250 mg total) by mouth daily. 30 tablet 0 Taking  . vitamin B-12 (CYANOCOBALAMIN) 1000 MCG tablet Take 1,000 mcg by mouth daily.   Not Taking   Facility-Administered Medications Ordered in Other Encounters  Medication Dose Route Frequency Provider Last Rate Last Dose  . influenza  inactive virus vaccine (FLUZONE/FLUARIX) injection 0.5 mL  0.5 mL Intramuscular Once Rita Ohara, MD       Allergies  Allergen Reactions  . Levofloxacin Other (See Comments)  . Promethazine Hcl Other (See Comments)    fainting  . Sulfa Antibiotics Swelling  . Sulfa Drugs Cross Reactors Swelling  . Sulfur Other (See Comments)    Social History   Tobacco Use  . Smoking status: Former Smoker    Packs/day: 1.00    Types: Cigarettes    Last attempt to quit: 09/29/1980    Years since quitting: 37.4  . Smokeless tobacco: Never Used  Substance Use Topics  . Alcohol use: Yes    Alcohol/week: 0.0 oz    Comment: 1 glass of wine 3-4  times/week; hard liquor (highball) or a beer once a week    Family History  Problem Relation Age of Onset  . Cancer Mother 55       COLON   . Heart disease Mother   . Cancer Maternal Aunt   . Breast cancer Maternal Aunt   . Mental illness Maternal Uncle   . Cancer Maternal Grandmother        stomach? more likely uterine  . Macular degeneration Maternal Grandmother   . Diabetes Neg Hx      Review of Systems  Constitutional: Negative for chills and fever.  HENT: Negative for congestion, sore throat and tinnitus.   Eyes: Negative for double vision, photophobia and pain.  Respiratory: Negative for cough, shortness of breath and wheezing.   Cardiovascular: Negative for chest pain, palpitations and orthopnea.  Gastrointestinal: Negative for heartburn, nausea and vomiting.  Genitourinary: Negative for dysuria, frequency and urgency.  Musculoskeletal: Positive for joint pain.  Neurological: Negative for dizziness, weakness and headaches.  Psychiatric/Behavioral: Negative for depression.    Objective: Physical Exam  Patient is an 83 year old female. Well nourished and well developed. General: Alert and oriented x3, cooperative and pleasant, no acute distress. Head: normocephalic, atraumatic, neck supple. Eyes: EOMI. Respiratory: breath sounds clear in all fields, no wheezing, rales, or rhonchi. Cardiovascular: Regular rate and rhythm, no murmurs, gallops or rubs.  Abdomen: non-tender to palpation and soft, normoactive bowel sounds. Musculoskeletal: Left Knee: No effusion. Range of motion 5-130 degrees. There is marked crepitus on range on motion of the left knee. Tenderness to palpation laterally and medially with no instability noted. Right Knee: No effusion. Range 0-130 degrees with moderate crepitus on ROM. Tenderness to palpation laterally and medially with no instability. Calves soft and nontender. Motor function intact in LE. Strength 5/5 LE bilaterally. Neuro: Distal pulses  2+. Sensation to light touch intact in LE.  Vital signs in last 24 hours: Blood pressure: 120/80 mmHg Pulse: 76 bpm  Labs: Estimated body mass index is 31.73 kg/m as calculated from the following:   Height as of 03/04/18: 5' 6.5" (1.689 m).   Weight as of 03/04/18: 90.5 kg (199 lb 9.6 oz).   Imaging Review Plain radiographs demonstrate moderate to severe degenerative joint diseasebilaterally in the knees, with the left being worse than the right. The overall alignment isneutral. The bone quality appears to be adequate for age and reported activity level.   Preoperative templating of the joint replacement has been completed, documented, and submitted to the Operating Room personnel in order to optimize intra-operative equipment management.   Anticipated LOS equal to or greater than 2 midnights due to - Age 66 and older with one or more of the following:  - Obesity  - Expected need for hospital services (PT, OT, Nursing) required for safe  discharge  - Anticipated need for postoperative skilled nursing care or inpatient rehab  - Active co-morbidities: None OR   - Unanticipated findings during/Post Surgery: None  - Patient is a high risk of re-admission due to: None    Assessment/Plan:  End stage arthritis, left knee   The patient history, physical examination, clinical judgment of the provider and imaging studies are consistent with end stage degenerative joint disease of the left knee(s) and total knee arthroplasty is deemed medically necessary. The treatment options including medical management, injection therapy arthroscopy and arthroplasty were discussed at length. The risks and benefits of total knee arthroplasty were presented and reviewed. The risks due to aseptic loosening, infection, stiffness, patella tracking problems, thromboembolic complications and other imponderables were discussed. The patient acknowledged the explanation, agreed to proceed with the plan and consent was  signed. Patient is being admitted for inpatient treatment for surgery, pain control, PT, OT, prophylactic antibiotics, VTE prophylaxis, progressive ambulation and ADL's and discharge planning. The patient is planning to be discharged to home with outpatient physical therapy.   Therapy Plans: outpatient therapy at [facility] Disposition: Home with husband Planned DVT Prophylaxis: aspirin 325mg  BID DME needed: Gilford Rile PCP: Tomi Bamberger TXA: IV Allergies: Sulfa, levofloxacin, promethazine  - Patient was instructed on what medications to stop prior to surgery. - Follow-up visit in 2 weeks with Dr. Wynelle Link - Begin physical therapy following surgery - Pre-operative lab work as pre-surgical testing - Prescriptions will be provided in hospital at time of discharge  Theresa Duty, PA-C Orthopedic Surgery [facility] [place]

## 2018-03-22 ENCOUNTER — Other Ambulatory Visit: Payer: Self-pay | Admitting: Radiology

## 2018-03-23 ENCOUNTER — Ambulatory Visit (HOSPITAL_COMMUNITY)
Admission: RE | Admit: 2018-03-23 | Discharge: 2018-03-23 | Disposition: A | Discharge status: Home / Self Care | Payer: Medicare Other | Source: Ambulatory Visit | Attending: Hematology and Oncology | Admitting: Hematology and Oncology

## 2018-03-23 ENCOUNTER — Encounter (HOSPITAL_COMMUNITY): Payer: Self-pay

## 2018-03-23 DIAGNOSIS — Z882 Allergy status to sulfonamides status: Secondary | ICD-10-CM | POA: Diagnosis not present

## 2018-03-23 DIAGNOSIS — K219 Gastro-esophageal reflux disease without esophagitis: Secondary | ICD-10-CM | POA: Diagnosis not present

## 2018-03-23 DIAGNOSIS — M5136 Other intervertebral disc degeneration, lumbar region: Secondary | ICD-10-CM | POA: Diagnosis not present

## 2018-03-23 DIAGNOSIS — Z923 Personal history of irradiation: Secondary | ICD-10-CM | POA: Diagnosis not present

## 2018-03-23 DIAGNOSIS — Z452 Encounter for adjustment and management of vascular access device: Secondary | ICD-10-CM | POA: Diagnosis present

## 2018-03-23 DIAGNOSIS — Z8542 Personal history of malignant neoplasm of other parts of uterus: Secondary | ICD-10-CM | POA: Insufficient documentation

## 2018-03-23 DIAGNOSIS — M8580 Other specified disorders of bone density and structure, unspecified site: Secondary | ICD-10-CM | POA: Insufficient documentation

## 2018-03-23 DIAGNOSIS — E669 Obesity, unspecified: Secondary | ICD-10-CM | POA: Diagnosis not present

## 2018-03-23 DIAGNOSIS — E039 Hypothyroidism, unspecified: Secondary | ICD-10-CM | POA: Diagnosis not present

## 2018-03-23 DIAGNOSIS — Z6829 Body mass index (BMI) 29.0-29.9, adult: Secondary | ICD-10-CM | POA: Diagnosis not present

## 2018-03-23 DIAGNOSIS — M1712 Unilateral primary osteoarthritis, left knee: Secondary | ICD-10-CM | POA: Diagnosis not present

## 2018-03-23 DIAGNOSIS — C541 Malignant neoplasm of endometrium: Secondary | ICD-10-CM

## 2018-03-23 HISTORY — PX: IR REMOVAL TUN ACCESS W/ PORT W/O FL MOD SED: IMG2290

## 2018-03-23 LAB — BASIC METABOLIC PANEL
ANION GAP: 9 (ref 5–15)
BUN: 22 mg/dL (ref 8–23)
CALCIUM: 8.7 mg/dL — AB (ref 8.9–10.3)
CHLORIDE: 107 mmol/L (ref 98–111)
CO2: 26 mmol/L (ref 22–32)
Creatinine, Ser: 1.54 mg/dL — ABNORMAL HIGH (ref 0.44–1.00)
GFR calc Af Amer: 35 mL/min — ABNORMAL LOW (ref >60)
GFR calc non Af Amer: 30 mL/min — ABNORMAL LOW (ref >60)
GLUCOSE: 101 mg/dL — AB (ref 70–99)
Potassium: 3.9 mmol/L (ref 3.5–5.1)
Sodium: 142 mmol/L (ref 135–145)

## 2018-03-23 LAB — CBC WITH DIFFERENTIAL/PLATELET
Basophils Absolute: 0 10*3/uL (ref 0.0–0.1)
Basophils Relative: 1 %
Eosinophils Absolute: 0.2 10*3/uL (ref 0.0–0.7)
Eosinophils Relative: 4 %
HEMATOCRIT: 38.9 % (ref 36.0–46.0)
Hemoglobin: 13 g/dL (ref 12.0–15.0)
LYMPHS ABS: 1.2 10*3/uL (ref 0.7–4.0)
Lymphocytes Relative: 32 %
MCH: 29.5 pg (ref 26.0–34.0)
MCHC: 33.4 g/dL (ref 30.0–36.0)
MCV: 88.4 fL (ref 78.0–100.0)
MONO ABS: 0.2 10*3/uL (ref 0.1–1.0)
MONOS PCT: 6 %
NEUTROS ABS: 2.2 10*3/uL (ref 1.7–7.7)
Neutrophils Relative %: 57 %
Platelets: 273 10*3/uL (ref 150–400)
RBC: 4.4 MIL/uL (ref 3.87–5.11)
RDW: 13.3 % (ref 11.5–15.5)
WBC: 3.8 10*3/uL — ABNORMAL LOW (ref 4.0–10.5)

## 2018-03-23 LAB — PROTIME-INR
INR: 1.01
Prothrombin Time: 13.2 seconds (ref 11.4–15.2)

## 2018-03-23 MED ORDER — LIDOCAINE-EPINEPHRINE (PF) 2 %-1:200000 IJ SOLN
INTRAMUSCULAR | Status: AC
  Filled 2018-03-23: qty 20

## 2018-03-23 MED ORDER — FLUMAZENIL 0.5 MG/5ML IV SOLN
INTRAVENOUS | Status: AC
  Filled 2018-03-23: qty 5

## 2018-03-23 MED ORDER — CEFAZOLIN SODIUM-DEXTROSE 2-4 GM/100ML-% IV SOLN
2.0000 g | INTRAVENOUS | Status: AC
  Administered 2018-03-23: 2 g via INTRAVENOUS

## 2018-03-23 MED ORDER — MIDAZOLAM HCL 2 MG/2ML IJ SOLN
INTRAMUSCULAR | Status: AC | PRN
  Administered 2018-03-23 (×2): 1 mg via INTRAVENOUS

## 2018-03-23 MED ORDER — CEFAZOLIN SODIUM-DEXTROSE 2-4 GM/100ML-% IV SOLN
INTRAVENOUS | Status: AC
  Filled 2018-03-23: qty 100

## 2018-03-23 MED ORDER — SODIUM CHLORIDE 0.9 % IV SOLN: INTRAVENOUS | Status: DC

## 2018-03-23 MED ORDER — FENTANYL CITRATE (PF) 100 MCG/2ML IJ SOLN
INTRAMUSCULAR | Status: AC
  Filled 2018-03-23: qty 2

## 2018-03-23 MED ORDER — MIDAZOLAM HCL 2 MG/2ML IJ SOLN
INTRAMUSCULAR | Status: AC
  Filled 2018-03-23: qty 2

## 2018-03-23 MED ORDER — FENTANYL CITRATE (PF) 100 MCG/2ML IJ SOLN
INTRAMUSCULAR | Status: AC | PRN
  Administered 2018-03-23 (×2): 50 ug via INTRAVENOUS

## 2018-03-23 MED ORDER — NALOXONE HCL 0.4 MG/ML IJ SOLN
INTRAMUSCULAR | Status: AC
  Filled 2018-03-23: qty 1

## 2018-03-23 NOTE — Discharge Instructions (Signed)
Implanted Port Removal, Care After °Refer to this sheet in the next few weeks. These instructions provide you with information about caring for yourself after your procedure. Your health care provider may also give you more specific instructions. Your treatment has been planned according to current medical practices, but problems sometimes occur. Call your health care provider if you have any problems or questions after your procedure. °What can I expect after the procedure? °After the procedure, it is common to have: °· Soreness or pain near your incision. °· Some swelling or bruising near your incision. ° °Follow these instructions at home: °Medicines °· Take over-the-counter and prescription medicines only as told by your health care provider. °· If you were prescribed an antibiotic medicine, take it as told by your health care provider. Do not stop taking the antibiotic even if you start to feel better. °Bathing °· Do not take baths, swim, or use a hot tub until your health care provider approves. Ask your health care provider if you can take showers. You may only be allowed to take sponge baths for bathing. °Incision care °· Follow instructions from your health care provider about how to take care of your incision. Make sure you: °? Wash your hands with soap and water before you change your bandage (dressing). If soap and water are not available, use hand sanitizer. °? Change your dressing as told by your health care provider. °? Keep your dressing dry. °? Leave stitches (sutures), skin glue, or adhesive strips in place. These skin closures may need to stay in place for 2 weeks or longer. If adhesive strip edges start to loosen and curl up, you may trim the loose edges. Do not remove adhesive strips completely unless your health care provider tells you to do that. °· Check your incision area every day for signs of infection. Check for: °? More redness, swelling, or pain. °? More fluid or  blood. °? Warmth. °? Pus or a bad smell. °Driving °· If you received a sedative, do not drive for 24 hours after the procedure. °· If you did not receive a sedative, ask your health care provider when it is safe to drive. °Activity °· Return to your normal activities as told by your health care provider. Ask your health care provider what activities are safe for you. °· Until your health care provider says it is safe: °? Do not lift anything that is heavier than 10 lb (4.5 kg). °? Do not do activities that involve lifting your arms over your head. °General instructions °· Do not use any tobacco products, such as cigarettes, chewing tobacco, and e-cigarettes. Tobacco can delay healing. If you need help quitting, ask your health care provider. °· Keep all follow-up visits as told by your health care provider. This is important. °Contact a health care provider if: °· You have more redness, swelling, or pain around your incision. °· You have more fluid or blood coming from your incision. °· Your incision feels warm to the touch. °· You have pus or a bad smell coming from your incision. °· You have a fever. °· You have pain that is not relieved by your pain medicine. °Get help right away if: °· You have chest pain. °· You have difficulty breathing. °This information is not intended to replace advice given to you by your health care provider. Make sure you discuss any questions you have with your health care provider. °Document Released: 08/27/2015 Document Revised: 02/21/2016 Document Reviewed: 06/20/2015 °Elsevier Interactive Patient   Education © 2018 [employer] Inc[employer] °Moderate Conscious Sedation, Adult, Care After °These instructions provide you with information about caring for yourself after your procedure. Your health care provider may also give you more specific instructions. Your treatment has been planned according to current medical practices, but problems sometimes occur. Call your health care provider if you have  any problems or questions after your procedure. °What can I expect after the procedure? °After your procedure, it is common: °· To feel sleepy for several hours. °· To feel clumsy and have poor balance for several hours. °· To have poor judgment for several hours. °· To vomit if you eat too soon. ° °Follow these instructions at home: °For at least 24 hours after the procedure: ° °· Do not: °? Participate in activities where you could fall or become injured. °? Drive. °? Use heavy machinery. °? Drink alcohol. °? Take sleeping pills or medicines that cause drowsiness. °? Make important decisions or sign legal documents. °? Take care of children on your own. °· Rest. °Eating and drinking °· Follow the diet recommended by your health care provider. °· If you vomit: °? Drink water, juice, or soup when you can drink without vomiting. °? Make sure you have little or no nausea before eating solid foods. °General instructions °· Have a responsible adult stay with you until you are awake and alert. °· Take over-the-counter and prescription medicines only as told by your health care provider. °· If you smoke, do not smoke without supervision. °· Keep all follow-up visits as told by your health care provider. This is important. °Contact a health care provider if: °· You keep feeling nauseous or you keep vomiting. °· You feel light-headed. °· You develop a rash. °· You have a fever. °Get help right away if: °· You have trouble breathing. °This information is not intended to replace advice given to you by your health care provider. Make sure you discuss any questions you have with your health care provider. °Document Released: 07/06/2013 Document Revised: 02/18/2016 Document Reviewed: 01/05/2016 °Elsevier Interactive Patient Education © 2018 Elsevier Inc. ° °

## 2018-03-23 NOTE — H&P (Signed)
Referring Physician(s): [name]  Supervising Physician: Arne Cleveland  Patient Status:  [facility] OP  Chief Complaint:  "I'm here to have my port removed"  Subjective: Patient familiar to IR service from prior Port-A-Cath placement in 2017, right pelvic fluid collection aspiration on 09/30/2016 and right pelvic abscess drain placement on 02/18/2017, removed in June 2018.  She has a history of endometrial carcinoma and is status post surgery, chemotherapy and vaginal brachytherapy.  She has no recurrence on recent imaging and presents again today for Port-A-Cath removal.  She denies fever, headache, chest pain, dyspnea, cough, abdominal/back pain, nausea, vomiting or bleeding. Past Medical History:  Diagnosis Date  . Arthritis   . Arthritis of knee, left   . Borderline osteopenia DEXA 2006 and 2012  . Cancer [facility])    endometrial  . Cataract   . Cholesterol serum elevated   . Colon polyps   . DDD (degenerative disc disease), lumbar   . FHx: colon cancer   . GERD (gastroesophageal reflux disease)   . History of radiation therapy 08/06/16 - 09/03/16   vaginal cuff treated to 30 Gy in 5 fractions  . Hypothyroidism   . Obesity    Past Surgical History:  Procedure Laterality Date  . CATARACT EXTRACTION, BILATERAL    . CHOLECYSTECTOMY    . COLONOSCOPY  2010, 04/2013   Buccini  . IR GENERIC HISTORICAL  08/11/2016   IR FLUORO GUIDE PORT INSERTION RIGHT 08/11/2016 Greggory Keen, MD [facility]INTER[facility]  . IR GENERIC HISTORICAL  08/11/2016   IR US GUIDE VASC ACCESS RIGHT 08/11/2016 Greggory Keen, MD [facility]  . IR RADIOLOGIST EVAL & MGMT  02/25/2017  . IR RADIOLOGIST EVAL & MGMT  03/11/2017  . PTERYGIUM EXCISION    . ROBOTIC ASSISTED TOTAL HYSTERECTOMY WITH BILATERAL SALPINGO OOPHERECTOMY N/A 05/06/2016   Procedure: XI ROBOTIC ASSISTED  LAPAROSCOPIC TOTAL HYSTERECTOMY WITH BILATERAL SALPINGO OOPHORECTOMY;  Surgeon: Nancy Marus, MD;  Location: [facility]S;  Service: Gynecology;  Laterality: N/A;    . SENTINEL NODE BIOPSY N/A 05/06/2016   Procedure: SENTINEL NODE BIOPSY;  Surgeon: Nancy Marus, MD;  Location: [facility];  Service: Gynecology;  Laterality: N/A;  . TUBAL LIGATION        Allergies: Levofloxacin; Promethazine hcl; Sulfa antibiotics; Sulfa drugs cross reactors; and Sulfur  Medications: Prior to Admission medications   Medication Sig Start Date End Date Taking? Authorizing Provider  b complex vitamins capsule Take 1 capsule by mouth daily.   Yes [provider]  Cholecalciferol (VITAMIN D) 2000 UNITS tablet Take 2,000 Units by mouth daily.   Yes [provider]  DULoxetine (CYMBALTA) 30 MG capsule Take 1 capsule (30 mg total) by mouth daily. 06/11/17  Yes Rita Ohara, MD  levothyroxine (SYNTHROID, LEVOTHROID) 88 MCG tablet TAKE ONE TABLET BY MOUTH ONCE DAILY BEFORE  BREAKFAST. 06/11/17  Yes Rita Ohara, MD  Magnesium 400 MG CAPS Take 1 capsule by mouth 2 (two) times daily.    Yes [provider]  Melatonin 1 MG TABS Take by mouth.   Yes [provider]  polyethylene glycol (MIRALAX / GLYCOLAX) packet Take 17 g by mouth 2 (two) times daily as needed for mild constipation.    Yes [provider]  terbinafine (LAMISIL) 250 MG tablet Take 1 tablet (250 mg total) by mouth daily. 10/20/17  Yes Hyatt, Max T, DPM  vitamin B-12 (CYANOCOBALAMIN) 1000 MCG tablet Take 1,000 mcg by mouth daily.   Yes [provider]  lidocaine-prilocaine (EMLA) cream  08/08/16  [provider]  naproxen sodium (ANAPROX) 220 MG tablet Take 220 mg by mouth 2 (two) times daily as needed (pain).     [provider]  senna-docusate (SENOKOT S) 8.6-50 MG tablet Take 1 tablet by mouth 2 (two) times daily as needed for mild constipation.    [provider]     Vital Signs: BP 130/81 (BP Location: Right Arm)   Pulse 99   Temp 97.9 F (36.6 C) (Oral)   Resp 18   Ht 5\' 6"  (1.676 m)   Wt 185 lb (83.9 kg)   SpO2 94%   BMI 29.86 kg/m    Physical Exam awake, alert.  Chest clear to auscultation bilaterally.  Clean, intact right chest wall Port-A-Cath.  Heart with regular rate and rhythm.  Abdomen soft, positive bowel sounds, nontender.  No significant lower extremity edema.  Imaging: No results found.  Labs:  CBC: Recent Labs    06/25/17 1119 03/04/18 1022 03/23/18 1150  WBC 4.0 3.8* 3.8*  HGB 13.6 12.1 13.0  HCT 41.2 36.5 38.9  PLT 263 231 273    COAGS: Recent Labs    03/23/18 1150  INR 1.01    BMP: Recent Labs    06/11/17 1011 06/25/17 1119 03/04/18 1022 03/23/18 1150  NA  --  140 138 142  K  --  4.5 4.1 3.9  CL  --  105 104 107  CO2  --  23 27 26   GLUCOSE 92 94 87 101*  BUN  --  15 22 22   CALCIUM  --  8.6 8.6 8.7*  CREATININE  --  0.86 1.39* 1.54*  GFRNONAA  --   --  35* 30*  GFRAA  --   --  40* 35*    LIVER FUNCTION TESTS: Recent Labs    06/25/17 1119 07/21/17 1452 03/04/18 1022  BILITOT 0.3 0.3 0.3  AST 16 15 15   ALT 7 7 <6  ALKPHOS  --   --  71  PROT 6.5 6.0* 6.5  ALBUMIN  --   --  3.5    Assessment and Plan:  Pt with history of endometrial carcinoma 2017 ; status post surgery, chemotherapy and vaginal brachytherapy.  She has no recurrence on recent imaging and presents  today for Port-A-Cath removal.  Details/risks of procedure, including but not limited to, internal bleeding, infection, injury to adjacent structures discussed with patient and spouse with their understanding and consent.   Electronically Signed: D. Rowe Robert, PA-C 03/23/2018, 12:26 PM   I spent a total of 20 minutes at the the patient's bedside AND on the patient's hospital floor or unit, greater than 50% of which was counseling/coordinating care for Port-A-Cath removal

## 2018-03-23 NOTE — Procedures (Signed)
  Procedure: R IJ port removal   EBL:   minimal Complications:  none immediate  See full dictation in [facility].  Dillard Cannon MD Main # [phone] Pager  [phone]

## 2018-04-02 ENCOUNTER — Encounter (HOSPITAL_COMMUNITY): Payer: Self-pay

## 2018-04-05 ENCOUNTER — Encounter (HOSPITAL_COMMUNITY): Payer: Self-pay

## 2018-04-05 ENCOUNTER — Other Ambulatory Visit: Payer: Self-pay

## 2018-04-05 ENCOUNTER — Encounter (HOSPITAL_COMMUNITY)
Admission: RE | Admit: 2018-04-05 | Discharge: 2018-04-05 | Disposition: A | Discharge status: Home / Self Care | Payer: Medicare Other | Source: Ambulatory Visit | Attending: Orthopedic Surgery | Admitting: Orthopedic Surgery

## 2018-04-05 DIAGNOSIS — Z800 Family history of malignant neoplasm of digestive organs: Secondary | ICD-10-CM | POA: Insufficient documentation

## 2018-04-05 DIAGNOSIS — Z01812 Encounter for preprocedural laboratory examination: Secondary | ICD-10-CM | POA: Diagnosis present

## 2018-04-05 DIAGNOSIS — M5136 Other intervertebral disc degeneration, lumbar region: Secondary | ICD-10-CM | POA: Insufficient documentation

## 2018-04-05 DIAGNOSIS — E039 Hypothyroidism, unspecified: Secondary | ICD-10-CM | POA: Diagnosis not present

## 2018-04-05 DIAGNOSIS — E669 Obesity, unspecified: Secondary | ICD-10-CM | POA: Diagnosis not present

## 2018-04-05 DIAGNOSIS — Z8542 Personal history of malignant neoplasm of other parts of uterus: Secondary | ICD-10-CM | POA: Diagnosis not present

## 2018-04-05 DIAGNOSIS — M1712 Unilateral primary osteoarthritis, left knee: Secondary | ICD-10-CM | POA: Diagnosis not present

## 2018-04-05 HISTORY — DX: Spondylosis without myelopathy or radiculopathy, lumbar region: M47.816

## 2018-04-05 HISTORY — DX: Spondylolisthesis, site unspecified: M43.10

## 2018-04-05 HISTORY — DX: Polyneuropathy, unspecified: G62.9

## 2018-04-05 HISTORY — DX: Decreased white blood cell count, unspecified: D72.819

## 2018-04-05 HISTORY — DX: Personal history of other diseases of the digestive system: Z87.19

## 2018-04-05 HISTORY — DX: Atherosclerosis of aorta: I70.0

## 2018-04-05 HISTORY — DX: Diverticulitis of large intestine without perforation or abscess without bleeding: K57.32

## 2018-04-05 HISTORY — DX: Umbilical hernia without obstruction or gangrene: K42.9

## 2018-04-05 HISTORY — DX: Female pelvic inflammatory disease, unspecified: N73.9

## 2018-04-05 HISTORY — DX: Disorder of kidney and ureter, unspecified: N28.9

## 2018-04-05 HISTORY — DX: Foot drop, left foot: M21.372

## 2018-04-05 LAB — CBC
HCT: 39.5 % (ref 36.0–46.0)
Hemoglobin: 12.9 g/dL (ref 12.0–15.0)
MCH: 28.9 pg (ref 26.0–34.0)
MCHC: 32.7 g/dL (ref 30.0–36.0)
MCV: 88.6 fL (ref 78.0–100.0)
PLATELETS: 312 10*3/uL (ref 150–400)
RBC: 4.46 MIL/uL (ref 3.87–5.11)
RDW: 13.4 % (ref 11.5–15.5)
WBC: 4.4 10*3/uL (ref 4.0–10.5)

## 2018-04-05 LAB — COMPREHENSIVE METABOLIC PANEL
ALBUMIN: 3.8 g/dL (ref 3.5–5.0)
ALT: 9 U/L (ref 0–44)
AST: 18 U/L (ref 15–41)
Alkaline Phosphatase: 61 U/L (ref 38–126)
Anion gap: 8 (ref 5–15)
BUN: 29 mg/dL — AB (ref 8–23)
CHLORIDE: 106 mmol/L (ref 98–111)
CO2: 28 mmol/L (ref 22–32)
Calcium: 9 mg/dL (ref 8.9–10.3)
Creatinine, Ser: 1.59 mg/dL — ABNORMAL HIGH (ref 0.44–1.00)
GFR calc Af Amer: 34 mL/min — ABNORMAL LOW (ref >60)
GFR calc non Af Amer: 29 mL/min — ABNORMAL LOW (ref >60)
GLUCOSE: 96 mg/dL (ref 70–99)
POTASSIUM: 5.5 mmol/L — AB (ref 3.5–5.1)
Sodium: 142 mmol/L (ref 135–145)
Total Bilirubin: 0.2 mg/dL — ABNORMAL LOW (ref 0.3–1.2)
Total Protein: 7 g/dL (ref 6.5–8.1)

## 2018-04-05 LAB — URINALYSIS, ROUTINE W REFLEX MICROSCOPIC
Bacteria, UA: NONE SEEN
Bilirubin Urine: NEGATIVE
GLUCOSE, UA: NEGATIVE mg/dL
Ketones, ur: NEGATIVE mg/dL
NITRITE: NEGATIVE
PH: 5 (ref 5.0–8.0)
Protein, ur: NEGATIVE mg/dL
SPECIFIC GRAVITY, URINE: 1.019 (ref 1.005–1.030)

## 2018-04-05 LAB — SURGICAL PCR SCREEN
MRSA, PCR: NEGATIVE
Staphylococcus aureus: NEGATIVE

## 2018-04-05 LAB — PROTIME-INR
INR: 1.05
Prothrombin Time: 13.6 seconds (ref 11.4–15.2)

## 2018-04-05 LAB — APTT: aPTT: 28 seconds (ref 24–36)

## 2018-04-05 NOTE — Progress Notes (Signed)
CMP ROUTED TO DR ALUISIO VIA Epic

## 2018-04-05 NOTE — Patient Instructions (Signed)
Kayla Guerrero  04/05/2018   Your procedure is scheduled on: 04-12-18   Report to [facility] Main  Entrance    Report to admitting at 9:00AM    Call this number if you have problems the morning of surgery [phone]     Remember: Do not eat food or drink liquids :After Midnight.     Take these medicines the morning of surgery with A SIP OF WATER: duloxetine, levothyroxine                                 You may not have any metal on your body including hair pins and              piercings  Do not wear jewelry, make-up, lotions, powders or perfumes, deodorant             Do not wear nail polish.  Do not shave  48 hours prior to surgery.              Do not bring valuables to the hospital. [place].  Contacts, dentures or bridgework may not be worn into surgery.  Leave suitcase in the car. After surgery it may be brought to your room.                Please read over the following fact sheets you were given: _____________________________________________________________________             [facility] - Preparing for Surgery Before surgery, you can play an important role.  Because skin is not sterile, your skin needs to be as free of germs as possible.  You can reduce the number of germs on your skin by washing with CHG (chlorahexidine gluconate) soap before surgery.  CHG is an antiseptic cleaner which kills germs and bonds with the skin to continue killing germs even after washing. Please DO NOT use if you have an allergy to CHG or antibacterial soaps.  If your skin becomes reddened/irritated stop using the CHG and inform your nurse when you arrive at Short Stay. Do not shave (including legs and underarms) for at least 48 hours prior to the first CHG shower.  You may shave your face/neck. Please follow these instructions carefully:  1.  Shower with CHG Soap the night before surgery and the  morning of  Surgery.  2.  If you choose to wash your hair, wash your hair first as usual with your  normal  shampoo.  3.  After you shampoo, rinse your hair and body thoroughly to remove the  shampoo.                           4.  Use CHG as you would any other liquid soap.  You can apply chg directly  to the skin and wash                       Gently with a scrungie or clean washcloth.  5.  Apply the CHG Soap to your body ONLY FROM THE NECK DOWN.   Do not use on face/ open  Wound or open sores. Avoid contact with eyes, ears mouth and genitals (private parts).                       Wash face,  Genitals (private parts) with your normal soap.             6.  Wash thoroughly, paying special attention to the area where your surgery  will be performed.  7.  Thoroughly rinse your body with warm water from the neck down.  8.  DO NOT shower/wash with your normal soap after using and rinsing off  the CHG Soap.                9.  Pat yourself dry with a clean towel.            10.  Wear clean pajamas.            11.  Place clean sheets on your bed the night of your first shower and do not  sleep with pets. Day of Surgery : Do not apply any lotions/deodorants the morning of surgery.  Please wear clean clothes to the hospital/surgery center.  FAILURE TO FOLLOW THESE INSTRUCTIONS MAY RESULT IN THE CANCELLATION OF YOUR SURGERY PATIENT SIGNATURE_________________________________  NURSE SIGNATURE__________________________________  ________________________________________________________________________   Kayla Guerrero  An incentive spirometer is a tool that can help keep your lungs clear and active. This tool measures how well you are filling your lungs with each breath. Taking long deep breaths may help reverse or decrease the chance of developing breathing (pulmonary) problems (especially infection) following:  A long period of time when you are unable to move or be active. BEFORE  THE PROCEDURE   If the spirometer includes an indicator to show your best effort, your nurse or respiratory therapist will set it to a desired goal.  If possible, sit up straight or lean slightly forward. Try not to slouch.  Hold the incentive spirometer in an upright position. INSTRUCTIONS FOR USE  1. Sit on the edge of your bed if possible, or sit up as far as you can in bed or on a chair. 2. Hold the incentive spirometer in an upright position. 3. Breathe out normally. 4. Place the mouthpiece in your mouth and seal your lips tightly around it. 5. Breathe in slowly and as deeply as possible, raising the piston or the ball toward the top of the column. 6. Hold your breath for 3-5 seconds or for as long as possible. Allow the piston or ball to fall to the bottom of the column. 7. Remove the mouthpiece from your mouth and breathe out normally. 8. Rest for a few seconds and repeat Steps 1 through 7 at least 10 times every 1-2 hours when you are awake. Take your time and take a few normal breaths between deep breaths. 9. The spirometer may include an indicator to show your best effort. Use the indicator as a goal to work toward during each repetition. 10. After each set of 10 deep breaths, practice coughing to be sure your lungs are clear. If you have an incision (the cut made at the time of surgery), support your incision when coughing by placing a pillow or rolled up towels firmly against it. Once you are able to get out of bed, walk around indoors and cough well. You may stop using the incentive spirometer when instructed by your caregiver.  RISKS AND COMPLICATIONS  Take your time so you do not get  dizzy or light-headed.  If you are in pain, you may need to take or ask for pain medication before doing incentive spirometry. It is harder to take a deep breath if you are having pain. AFTER USE  Rest and breathe slowly and easily.  It can be helpful to keep track of a log of your progress.  Your caregiver can provide you with a simple table to help with this. If you are using the spirometer at home, follow these instructions: [place] IF:   You are having difficultly using the spirometer.  You have trouble using the spirometer as often as instructed.  Your pain medication is not giving enough relief while using the spirometer.  You develop fever of 100.5 F (38.1 C) or higher. SEEK IMMEDIATE MEDICAL CARE IF:   You cough up bloody sputum that had not been present before.  You develop fever of 102 F (38.9 C) or greater.  You develop worsening pain at or near the incision site. MAKE SURE YOU:   Understand these instructions.  Will watch your condition.  Will get help right away if you are not doing well or get worse. Document Released: 01/26/2007 Document Revised: 12/08/2011 Document Reviewed: 03/29/2007 Exit[employer] Patient Information 2014 [employer], [place].   ________________________________________________________________________  WHAT IS A BLOOD TRANSFUSION? Blood Transfusion Information  A transfusion is the replacement of blood or some of its parts. Blood is made up of multiple cells which provide different functions.  Red blood cells carry oxygen and are used for blood loss replacement.  White blood cells fight against infection.  Platelets control bleeding.  Plasma helps clot blood.  Other blood products are available for specialized needs, such as hemophilia or other clotting disorders. BEFORE THE TRANSFUSION  Who gives blood for transfusions?   Healthy volunteers who are fully evaluated to make sure their blood is safe. This is blood bank blood. Transfusion therapy is the safest it has ever been in the practice of medicine. Before blood is taken from a donor, a complete history is taken to make sure that person has no history of diseases nor engages in risky social behavior (examples are intravenous drug use or sexual activity with multiple  partners). The donor's travel history is screened to minimize risk of transmitting infections, such as malaria. The donated blood is tested for signs of infectious diseases, such as HIV and hepatitis. The blood is then tested to be sure it is compatible with you in order to minimize the chance of a transfusion reaction. If you or a relative donates blood, this is often done in anticipation of surgery and is not appropriate for emergency situations. It takes many days to process the donated blood. RISKS AND COMPLICATIONS Although transfusion therapy is very safe and saves many lives, the main dangers of transfusion include:   Getting an infectious disease.  Developing a transfusion reaction. This is an allergic reaction to something in the blood you were given. Every precaution is taken to prevent this. The decision to have a blood transfusion has been considered carefully by your caregiver before blood is given. Blood is not given unless the benefits outweigh the risks. AFTER THE TRANSFUSION  Right after receiving a blood transfusion, you will usually feel much better and more energetic. This is especially true if your red blood cells have gotten low (anemic). The transfusion raises the level of the red blood cells which carry oxygen, and this usually causes an energy increase.  The nurse administering the transfusion will  monitor you carefully for complications. HOME CARE INSTRUCTIONS  No special instructions are needed after a transfusion. You may find your energy is better. Speak with your caregiver about any limitations on activity for underlying diseases you may have. SEEK MEDICAL CARE IF:   Your condition is not improving after your transfusion.  You develop redness or irritation at the intravenous (IV) site. SEEK IMMEDIATE MEDICAL CARE IF:  Any of the following symptoms occur over the next 12 hours:  Shaking chills.  You have a temperature by mouth above 102 F (38.9 C), not  controlled by medicine.  Chest, back, or muscle pain.  People around you feel you are not acting correctly or are confused.  Shortness of breath or difficulty breathing.  Dizziness and fainting.  You get a rash or develop hives.  You have a decrease in urine output.  Your urine turns a dark color or changes to pink, red, or brown. Any of the following symptoms occur over the next 10 days:  You have a temperature by mouth above 102 F (38.9 C), not controlled by medicine.  Shortness of breath.  Weakness after normal activity.  The white part of the eye turns yellow (jaundice).  You have a decrease in the amount of urine or are urinating less often.  Your urine turns a dark color or changes to pink, red, or brown. Document Released: 09/12/2000 Document Revised: 12/08/2011 Document Reviewed: 05/01/2008 [facility] Patient Information 2014 [place], [place].  _______________________________________________________________________

## 2018-04-07 ENCOUNTER — Telehealth: Payer: Self-pay | Admitting: *Deleted

## 2018-04-07 NOTE — Telephone Encounter (Signed)
She stopped the medication in 01/2017, but was back on it just a short while later, due to recurrent depression. Is there a particular reason she would like to come off?  My feeling is that with recurrent depression, and about to have a big surgery, I would keep it on board.  If it is to be stopped, it does need to be tapered. She is already on the lowest dose, so she can just skip every 3rd day for a week, then every other day for a week, then take it every 3rd day for a week, then stop.  But unless there is a really good reason for stopping, my recommendation is to continue it.

## 2018-04-07 NOTE — Telephone Encounter (Signed)
Patient called and would like to know if you would be okay with her coming off of the duloxetine and if so, how does she do this? Cold [place]? Or is there a step down type process? Please advise, thanks.

## 2018-04-07 NOTE — Telephone Encounter (Signed)
Patient states she is just so sleepy and just doesn't think she needs to be on this medication any longer. I went over Dr.Knapp's recommendation and also how to come off this medication if she chooses. She states she will most likely take through surgery and then think about coming off.

## 2018-04-11 MED ORDER — TRANEXAMIC ACID 1000 MG/10ML IV SOLN
1000.0000 mg | INTRAVENOUS | Status: AC
  Administered 2018-04-12: 1000 mg via INTRAVENOUS
  Filled 2018-04-11: qty 1100

## 2018-04-11 MED ORDER — BUPIVACAINE LIPOSOME 1.3 % IJ SUSP
20.0000 mL | INTRAMUSCULAR | Status: DC
  Filled 2018-04-11: qty 20

## 2018-04-12 ENCOUNTER — Ambulatory Visit (HOSPITAL_COMMUNITY): Payer: Medicare Other | Admitting: Certified Registered Nurse Anesthetist

## 2018-04-12 ENCOUNTER — Encounter (HOSPITAL_COMMUNITY)
Admission: AD | Disposition: A | Discharge status: Home / Self Care | Payer: Self-pay | Source: Ambulatory Visit | Attending: Orthopedic Surgery

## 2018-04-12 ENCOUNTER — Other Ambulatory Visit: Payer: Self-pay

## 2018-04-12 ENCOUNTER — Observation Stay (HOSPITAL_COMMUNITY)
Admission: AD | Admit: 2018-04-12 | Discharge: 2018-04-14 | Disposition: A | Discharge status: Home / Self Care | Payer: Medicare Other | Source: Ambulatory Visit | Attending: Orthopedic Surgery | Admitting: Orthopedic Surgery

## 2018-04-12 ENCOUNTER — Encounter (HOSPITAL_COMMUNITY): Payer: Self-pay

## 2018-04-12 DIAGNOSIS — Z87891 Personal history of nicotine dependence: Secondary | ICD-10-CM | POA: Insufficient documentation

## 2018-04-12 DIAGNOSIS — Z6831 Body mass index (BMI) 31.0-31.9, adult: Secondary | ICD-10-CM | POA: Insufficient documentation

## 2018-04-12 DIAGNOSIS — Z9221 Personal history of antineoplastic chemotherapy: Secondary | ICD-10-CM | POA: Insufficient documentation

## 2018-04-12 DIAGNOSIS — Z923 Personal history of irradiation: Secondary | ICD-10-CM | POA: Diagnosis not present

## 2018-04-12 DIAGNOSIS — Z79899 Other long term (current) drug therapy: Secondary | ICD-10-CM | POA: Diagnosis not present

## 2018-04-12 DIAGNOSIS — T451X5A Adverse effect of antineoplastic and immunosuppressive drugs, initial encounter: Secondary | ICD-10-CM | POA: Diagnosis not present

## 2018-04-12 DIAGNOSIS — E669 Obesity, unspecified: Secondary | ICD-10-CM | POA: Insufficient documentation

## 2018-04-12 DIAGNOSIS — I739 Peripheral vascular disease, unspecified: Secondary | ICD-10-CM | POA: Insufficient documentation

## 2018-04-12 DIAGNOSIS — Z79890 Hormone replacement therapy (postmenopausal): Secondary | ICD-10-CM | POA: Diagnosis not present

## 2018-04-12 DIAGNOSIS — H3530 Unspecified macular degeneration: Secondary | ICD-10-CM | POA: Diagnosis not present

## 2018-04-12 DIAGNOSIS — G622 Polyneuropathy due to other toxic agents: Secondary | ICD-10-CM | POA: Diagnosis not present

## 2018-04-12 DIAGNOSIS — Z8542 Personal history of malignant neoplasm of other parts of uterus: Secondary | ICD-10-CM | POA: Insufficient documentation

## 2018-04-12 DIAGNOSIS — Z791 Long term (current) use of non-steroidal anti-inflammatories (NSAID): Secondary | ICD-10-CM | POA: Insufficient documentation

## 2018-04-12 DIAGNOSIS — M1712 Unilateral primary osteoarthritis, left knee: Principal | ICD-10-CM | POA: Insufficient documentation

## 2018-04-12 DIAGNOSIS — M1710 Unilateral primary osteoarthritis, unspecified knee: Secondary | ICD-10-CM

## 2018-04-12 DIAGNOSIS — M179 Osteoarthritis of knee, unspecified: Secondary | ICD-10-CM | POA: Diagnosis present

## 2018-04-12 DIAGNOSIS — E039 Hypothyroidism, unspecified: Secondary | ICD-10-CM | POA: Insufficient documentation

## 2018-04-12 HISTORY — PX: TOTAL KNEE ARTHROPLASTY: SHX125

## 2018-04-12 LAB — TYPE AND SCREEN
ABO/RH(D): A POS
ANTIBODY SCREEN: NEGATIVE

## 2018-04-12 SURGERY — ARTHROPLASTY, KNEE, TOTAL
Anesthesia: Monitor Anesthesia Care | Site: Knee | Laterality: Left

## 2018-04-12 MED ORDER — LEVOTHYROXINE SODIUM 88 MCG PO TABS
88.0000 ug | ORAL_TABLET | Freq: Every day | ORAL | Status: DC
  Administered 2018-04-13 – 2018-04-14 (×2): 88 ug via ORAL
  Filled 2018-04-12 (×2): qty 1

## 2018-04-12 MED ORDER — METOCLOPRAMIDE HCL 5 MG PO TABS: 5.0000 mg | ORAL_TABLET | Freq: Three times a day (TID) | ORAL | Status: DC | PRN

## 2018-04-12 MED ORDER — STERILE WATER FOR IRRIGATION IR SOLN
Status: DC | PRN
  Administered 2018-04-12: 2000 mL

## 2018-04-12 MED ORDER — SODIUM CHLORIDE 0.9 % IR SOLN
Status: DC | PRN
  Administered 2018-04-12: 1000 mL

## 2018-04-12 MED ORDER — CEFAZOLIN SODIUM-DEXTROSE 2-4 GM/100ML-% IV SOLN
2.0000 g | Freq: Four times a day (QID) | INTRAVENOUS | Status: AC
  Administered 2018-04-12 (×2): 2 g via INTRAVENOUS
  Filled 2018-04-12 (×2): qty 100

## 2018-04-12 MED ORDER — HYDROMORPHONE HCL 1 MG/ML IJ SOLN
0.5000 mg | INTRAMUSCULAR | Status: DC | PRN
  Administered 2018-04-13: 0.5 mg via INTRAVENOUS
  Filled 2018-04-12 (×2): qty 1

## 2018-04-12 MED ORDER — MENTHOL 3 MG MT LOZG: 1.0000 | LOZENGE | OROMUCOSAL | Status: DC | PRN

## 2018-04-12 MED ORDER — SODIUM CHLORIDE 0.9 % IV SOLN
INTRAVENOUS | Status: DC
  Administered 2018-04-12: 14:00:00 via INTRAVENOUS

## 2018-04-12 MED ORDER — CLONIDINE HCL (ANALGESIA) 100 MCG/ML EP SOLN
EPIDURAL | Status: DC | PRN
  Administered 2018-04-12: 50 ug

## 2018-04-12 MED ORDER — MIDAZOLAM HCL 2 MG/2ML IJ SOLN
1.0000 mg | INTRAMUSCULAR | Status: DC
  Filled 2018-04-12: qty 2

## 2018-04-12 MED ORDER — PROPOFOL 10 MG/ML IV BOLUS
INTRAVENOUS | Status: AC
  Filled 2018-04-12: qty 40

## 2018-04-12 MED ORDER — ONDANSETRON HCL 4 MG/2ML IJ SOLN
INTRAMUSCULAR | Status: DC | PRN
  Administered 2018-04-12: 4 mg via INTRAVENOUS

## 2018-04-12 MED ORDER — FENTANYL CITRATE (PF) 100 MCG/2ML IJ SOLN
25.0000 ug | INTRAMUSCULAR | Status: DC | PRN
  Administered 2018-04-12: 50 ug via INTRAVENOUS

## 2018-04-12 MED ORDER — ONDANSETRON HCL 4 MG/2ML IJ SOLN
4.0000 mg | Freq: Four times a day (QID) | INTRAMUSCULAR | Status: DC | PRN
  Administered 2018-04-12: 4 mg via INTRAVENOUS
  Filled 2018-04-12: qty 2

## 2018-04-12 MED ORDER — PROPOFOL 500 MG/50ML IV EMUL
INTRAVENOUS | Status: DC | PRN
  Administered 2018-04-12: 75 ug/kg/min via INTRAVENOUS

## 2018-04-12 MED ORDER — SODIUM CHLORIDE 0.9 % IJ SOLN
INTRAMUSCULAR | Status: DC | PRN
  Administered 2018-04-12: 60 mL

## 2018-04-12 MED ORDER — LIDOCAINE 2% (20 MG/ML) 5 ML SYRINGE
INTRAMUSCULAR | Status: AC
  Filled 2018-04-12: qty 5

## 2018-04-12 MED ORDER — CEFAZOLIN SODIUM-DEXTROSE 2-4 GM/100ML-% IV SOLN
2.0000 g | INTRAVENOUS | Status: AC
  Administered 2018-04-12: 2 g via INTRAVENOUS
  Filled 2018-04-12: qty 100

## 2018-04-12 MED ORDER — ONDANSETRON HCL 4 MG/2ML IJ SOLN: 4.0000 mg | Freq: Once | INTRAMUSCULAR | Status: DC | PRN

## 2018-04-12 MED ORDER — PHENYLEPHRINE HCL 10 MG/ML IJ SOLN
INTRAMUSCULAR | Status: AC
  Filled 2018-04-12: qty 1

## 2018-04-12 MED ORDER — POLYETHYLENE GLYCOL 3350 17 G PO PACK
17.0000 g | PACK | Freq: Every day | ORAL | Status: DC | PRN
  Administered 2018-04-13: 17 g via ORAL
  Filled 2018-04-12 (×2): qty 1

## 2018-04-12 MED ORDER — SODIUM CHLORIDE 0.9 % IJ SOLN
INTRAMUSCULAR | Status: AC
  Filled 2018-04-12: qty 50

## 2018-04-12 MED ORDER — BUPIVACAINE LIPOSOME 1.3 % IJ SUSP
INTRAMUSCULAR | Status: DC | PRN
  Administered 2018-04-12: 20 mL

## 2018-04-12 MED ORDER — OXYCODONE HCL 5 MG PO TABS
5.0000 mg | ORAL_TABLET | ORAL | Status: DC | PRN
  Administered 2018-04-12 (×2): 5 mg via ORAL
  Administered 2018-04-13 (×2): 10 mg via ORAL
  Administered 2018-04-13: 5 mg via ORAL
  Administered 2018-04-13 – 2018-04-14 (×3): 10 mg via ORAL
  Administered 2018-04-14: 5 mg via ORAL
  Filled 2018-04-12 (×2): qty 2
  Filled 2018-04-12 (×3): qty 1

## 2018-04-12 MED ORDER — ONDANSETRON HCL 4 MG PO TABS: 4.0000 mg | ORAL_TABLET | Freq: Four times a day (QID) | ORAL | Status: DC | PRN

## 2018-04-12 MED ORDER — METOCLOPRAMIDE HCL 5 MG/ML IJ SOLN: 5.0000 mg | Freq: Three times a day (TID) | INTRAMUSCULAR | Status: DC | PRN

## 2018-04-12 MED ORDER — PROPOFOL 10 MG/ML IV BOLUS
INTRAVENOUS | Status: AC
  Filled 2018-04-12: qty 20

## 2018-04-12 MED ORDER — DEXAMETHASONE SODIUM PHOSPHATE 10 MG/ML IJ SOLN
10.0000 mg | Freq: Once | INTRAMUSCULAR | Status: AC
  Administered 2018-04-13: 10 mg via INTRAVENOUS
  Filled 2018-04-12: qty 1

## 2018-04-12 MED ORDER — PHENYLEPHRINE HCL 10 MG/ML IJ SOLN
INTRAVENOUS | Status: DC | PRN
  Administered 2018-04-12: 25 ug/min via INTRAVENOUS

## 2018-04-12 MED ORDER — 0.9 % SODIUM CHLORIDE (POUR BTL) OPTIME
TOPICAL | Status: DC | PRN
  Administered 2018-04-12: 1000 mL

## 2018-04-12 MED ORDER — LIDOCAINE 2% (20 MG/ML) 5 ML SYRINGE
INTRAMUSCULAR | Status: DC | PRN
  Administered 2018-04-12: 60 mg via INTRAVENOUS

## 2018-04-12 MED ORDER — ONDANSETRON HCL 4 MG/2ML IJ SOLN
INTRAMUSCULAR | Status: AC
  Filled 2018-04-12: qty 2

## 2018-04-12 MED ORDER — FENTANYL CITRATE (PF) 100 MCG/2ML IJ SOLN
INTRAMUSCULAR | Status: AC
  Filled 2018-04-12: qty 2

## 2018-04-12 MED ORDER — ACETAMINOPHEN 10 MG/ML IV SOLN
1000.0000 mg | Freq: Four times a day (QID) | INTRAVENOUS | Status: AC
  Administered 2018-04-12: 1000 mg via INTRAVENOUS
  Filled 2018-04-12: qty 100

## 2018-04-12 MED ORDER — DULOXETINE HCL 30 MG PO CPEP
30.0000 mg | ORAL_CAPSULE | Freq: Every day | ORAL | Status: DC
  Administered 2018-04-13 – 2018-04-14 (×2): 30 mg via ORAL
  Filled 2018-04-12 (×2): qty 1

## 2018-04-12 MED ORDER — FLEET ENEMA 7-19 GM/118ML RE ENEM: 1.0000 | ENEMA | Freq: Once | RECTAL | Status: DC | PRN

## 2018-04-12 MED ORDER — METHOCARBAMOL 1000 MG/10ML IJ SOLN
500.0000 mg | Freq: Four times a day (QID) | INTRAVENOUS | Status: DC | PRN
  Administered 2018-04-12: 500 mg via INTRAVENOUS
  Filled 2018-04-12: qty 550

## 2018-04-12 MED ORDER — ACETAMINOPHEN 500 MG PO TABS
1000.0000 mg | ORAL_TABLET | Freq: Four times a day (QID) | ORAL | Status: AC
  Administered 2018-04-12 – 2018-04-13 (×4): 1000 mg via ORAL
  Filled 2018-04-12 (×4): qty 2

## 2018-04-12 MED ORDER — PHENOL 1.4 % MT LIQD
1.0000 | OROMUCOSAL | Status: DC | PRN
  Filled 2018-04-12: qty 177

## 2018-04-12 MED ORDER — DEXAMETHASONE SODIUM PHOSPHATE 10 MG/ML IJ SOLN
8.0000 mg | Freq: Once | INTRAMUSCULAR | Status: AC
  Administered 2018-04-12: 10 mg via INTRAVENOUS

## 2018-04-12 MED ORDER — ASPIRIN EC 325 MG PO TBEC
325.0000 mg | DELAYED_RELEASE_TABLET | Freq: Two times a day (BID) | ORAL | Status: DC
  Administered 2018-04-13 – 2018-04-14 (×3): 325 mg via ORAL
  Filled 2018-04-12 (×3): qty 1

## 2018-04-12 MED ORDER — BUPIVACAINE IN DEXTROSE 0.75-8.25 % IT SOLN
INTRATHECAL | Status: DC | PRN
  Administered 2018-04-12: 1.8 mL via INTRATHECAL

## 2018-04-12 MED ORDER — METHOCARBAMOL 500 MG PO TABS
500.0000 mg | ORAL_TABLET | Freq: Four times a day (QID) | ORAL | Status: DC | PRN
  Administered 2018-04-12 – 2018-04-13 (×3): 500 mg via ORAL
  Filled 2018-04-12 (×4): qty 1

## 2018-04-12 MED ORDER — FENTANYL CITRATE (PF) 100 MCG/2ML IJ SOLN
50.0000 ug | INTRAMUSCULAR | Status: DC
Start: 2018-04-12 — End: 2018-04-12
  Administered 2018-04-12: 50 ug via INTRAVENOUS
  Filled 2018-04-12: qty 2

## 2018-04-12 MED ORDER — DIPHENHYDRAMINE HCL 12.5 MG/5ML PO ELIX: 12.5000 mg | ORAL_SOLUTION | ORAL | Status: DC | PRN

## 2018-04-12 MED ORDER — DOCUSATE SODIUM 100 MG PO CAPS
100.0000 mg | ORAL_CAPSULE | Freq: Two times a day (BID) | ORAL | Status: DC
  Administered 2018-04-12 – 2018-04-14 (×4): 100 mg via ORAL
  Filled 2018-04-12 (×4): qty 1

## 2018-04-12 MED ORDER — ROPIVACAINE HCL 7.5 MG/ML IJ SOLN
INTRAMUSCULAR | Status: DC | PRN
  Administered 2018-04-12: 20 mL via PERINEURAL

## 2018-04-12 MED ORDER — CHLORHEXIDINE GLUCONATE 4 % EX LIQD: 60.0000 mL | Freq: Once | CUTANEOUS | Status: DC

## 2018-04-12 MED ORDER — BISACODYL 10 MG RE SUPP: 10.0000 mg | Freq: Every day | RECTAL | Status: DC | PRN

## 2018-04-12 MED ORDER — SODIUM CHLORIDE 0.9 % IJ SOLN
INTRAMUSCULAR | Status: AC
  Filled 2018-04-12: qty 10

## 2018-04-12 MED ORDER — OXYCODONE HCL 5 MG PO TABS
10.0000 mg | ORAL_TABLET | ORAL | Status: DC | PRN
  Administered 2018-04-14: 15 mg via ORAL
  Filled 2018-04-12: qty 2
  Filled 2018-04-12 (×3): qty 3
  Filled 2018-04-12: qty 2

## 2018-04-12 MED ORDER — LACTATED RINGERS IV SOLN
INTRAVENOUS | Status: DC
  Administered 2018-04-12: 10:00:00 via INTRAVENOUS

## 2018-04-12 SURGICAL SUPPLY — 48 items
BAG SPEC THK2 15X12 ZIP CLS (MISCELLANEOUS) ×1
BAG ZIPLOCK 12X15 (MISCELLANEOUS) ×3 IMPLANT
BANDAGE ACE 6X5 VEL STRL LF (GAUZE/BANDAGES/DRESSINGS) ×3 IMPLANT
BLADE SAG 18X100X1.27 (BLADE) ×3 IMPLANT
BLADE SAW SGTL 11.0X1.19X90.0M (BLADE) ×3 IMPLANT
BOWL SMART MIX CTS (DISPOSABLE) ×3 IMPLANT
CAP KNEE TOTAL 3 SIGMA ×2 IMPLANT
CEMENT HV SMART SET (Cement) ×6 IMPLANT
CLOSURE WOUND 1/2 X4 (GAUZE/BANDAGES/DRESSINGS) ×1
COVER SURGICAL LIGHT HANDLE (MISCELLANEOUS) ×3 IMPLANT
CUFF TOURN SGL QUICK 34 (TOURNIQUET CUFF) ×3
CUFF TRNQT CYL 34X4X40X1 (TOURNIQUET CUFF) ×1 IMPLANT
DECANTER SPIKE VIAL GLASS SM (MISCELLANEOUS) ×3 IMPLANT
DRAPE U-SHAPE 47X51 STRL (DRAPES) ×3 IMPLANT
DRSG ADAPTIC 3X8 NADH LF (GAUZE/BANDAGES/DRESSINGS) ×3 IMPLANT
DRSG PAD ABDOMINAL 8X10 ST (GAUZE/BANDAGES/DRESSINGS) ×3 IMPLANT
DURAPREP 26ML APPLICATOR (WOUND CARE) ×3 IMPLANT
ELECT REM PT RETURN 15FT ADLT (MISCELLANEOUS) ×3 IMPLANT
EVACUATOR 1/8 PVC DRAIN (DRAIN) ×3 IMPLANT
GAUZE SPONGE 4X4 12PLY STRL (GAUZE/BANDAGES/DRESSINGS) ×3 IMPLANT
GLOVE BIO SURGEON STRL SZ8 (GLOVE) ×6 IMPLANT
GLOVE BIOGEL PI IND STRL 8 (GLOVE) ×1 IMPLANT
GLOVE BIOGEL PI INDICATOR 8 (GLOVE) ×6
GLOVE ECLIPSE 7.5 STRL STRAW (GLOVE) ×2 IMPLANT
GLOVE ECLIPSE 8.0 STRL XLNG CF (GLOVE) ×2 IMPLANT
GLOVE SURG SS PI 6.5 STRL IVOR (GLOVE) ×5 IMPLANT
GOWN STRL REUS W/TWL LRG LVL3 (GOWN DISPOSABLE) ×6 IMPLANT
HANDPIECE INTERPULSE COAX TIP (DISPOSABLE) ×3
HOLDER FOLEY CATH W/STRAP (MISCELLANEOUS) IMPLANT
IMMOBILIZER KNEE 20 (SOFTGOODS) ×5 IMPLANT
IMMOBILIZER KNEE 20 THIGH 36 (SOFTGOODS) ×1 IMPLANT
MANIFOLD NEPTUNE II (INSTRUMENTS) ×3 IMPLANT
NS IRRIG 1000ML POUR BTL (IV SOLUTION) ×3 IMPLANT
PACK TOTAL KNEE CUSTOM (KITS) ×3 IMPLANT
PAD ABD 8X10 STRL (GAUZE/BANDAGES/DRESSINGS) ×2 IMPLANT
PADDING CAST COTTON 6X4 STRL (CAST SUPPLIES) ×5 IMPLANT
POSITIONER SURGICAL ARM (MISCELLANEOUS) ×3 IMPLANT
SET HNDPC FAN SPRY TIP SCT (DISPOSABLE) ×1 IMPLANT
STRIP CLOSURE SKIN 1/2X4 (GAUZE/BANDAGES/DRESSINGS) ×3 IMPLANT
SUT MNCRL AB 4-0 PS2 18 (SUTURE) ×3 IMPLANT
SUT STRATAFIX 0 PDS 27 VIOLET (SUTURE) ×3
SUT VIC AB 2-0 CT1 27 (SUTURE) ×9
SUT VIC AB 2-0 CT1 TAPERPNT 27 (SUTURE) ×3 IMPLANT
SUTURE STRATFX 0 PDS 27 VIOLET (SUTURE) ×1 IMPLANT
TRAY FOLEY CATH 14FR (SET/KITS/TRAYS/PACK) ×2 IMPLANT
WATER STERILE IRR 1000ML POUR (IV SOLUTION) ×6 IMPLANT
WRAP KNEE MAXI GEL POST OP (GAUZE/BANDAGES/DRESSINGS) ×3 IMPLANT
YANKAUER SUCT BULB TIP 10FT TU (MISCELLANEOUS) ×3 IMPLANT

## 2018-04-12 NOTE — Op Note (Signed)
OPERATIVE REPORT-TOTAL KNEE ARTHROPLASTY   Pre-operative diagnosis- Osteoarthritis  Left knee(s)  Post-operative diagnosis- Osteoarthritis Left knee(s)  Procedure-  Left  Total Knee Arthroplasty  Surgeon- Dione Plover. Isaih Bulger, MD  Assistant- Molli Barrows, PA-C   Anesthesia-  Adductor canal block and spinal  EBL- 25 ml   Drains Hemovac  Tourniquet time-  Total Tourniquet Time Documented: Thigh (Left) - 35 minutes Total: Thigh (Left) - 35 minutes     Complications- None  Condition-PACU - hemodynamically stable.   Brief Clinical Note  Kayla Guerrero is a 83 y.o. year old female with end stage OA of her left knee with progressively worsening pain and dysfunction. She has constant pain, with activity and at rest and significant functional deficits with difficulties even with ADLs. She has had extensive non-op management including analgesics, injections of cortisone and viscosupplements, and home exercise program, but remains in significant pain with significant dysfunction. Radiographs show bone on bone arthritis lateral and patellofemoral . She presents now for left Total Knee Arthroplasty.    Procedure in detail---   The patient is brought into the operating room and positioned supine on the operating table. After successful administration of  Adductor canal block and spinal,   a tourniquet is placed high on the  Left thigh(s) and the lower extremity is prepped and draped in the usual sterile fashion. Time out is performed by the operating team and then the  Left lower extremity is wrapped in Esmarch, knee flexed and the tourniquet inflated to 300 mmHg.       A midline incision is made with a ten blade through the subcutaneous tissue to the level of the extensor mechanism. A fresh blade is used to make a medial parapatellar arthrotomy. Soft tissue over the proximal medial tibia is subperiosteally elevated to the joint line with a knife and into the semimembranosus bursa with a Cobb  elevator. Soft tissue over the proximal lateral tibia is elevated with attention being paid to avoiding the patellar tendon on the tibial tubercle. The patella is everted, knee flexed 90 degrees and the ACL and PCL are removed. Findings are bone on bone lateral and patellofemoral with large global osteophytes.        The drill is used to create a starting hole in the distal femur and the canal is thoroughly irrigated with sterile saline to remove the fatty contents. The 5 degree Left  valgus alignment guide is placed into the femoral canal and the distal femoral cutting block is pinned to remove 10 mm off the distal femur. Resection is made with an oscillating saw.      The tibia is subluxed forward and the menisci are removed. The extramedullary alignment guide is placed referencing proximally at the medial aspect of the tibial tubercle and distally along the second metatarsal axis and tibial crest. The block is pinned to remove 46mm off the more deficient lateral  side. Resection is made with an oscillating saw. Size 4is the most appropriate size for the tibia and the proximal tibia is prepared with the modular drill and keel punch for that size.      The femoral sizing guide is placed and size 4 is most appropriate. Rotation is marked off the epicondylar axis and confirmed by creating a rectangular flexion gap at 90 degrees. The size 4 cutting block is pinned in this rotation and the anterior, posterior and chamfer cuts are made with the oscillating saw. The intercondylar block is then placed and that cut is  made.      Trial size 4 tibial component, trial size 4 posterior stabilized femur and a 12.5  mm posterior stabilized rotating platform insert trial is placed. Full extension is achieved with excellent varus/valgus and anterior/posterior balance throughout full range of motion. The patella is everted and thickness measured to be 24  mm. Free hand resection is taken to 14 mm, a 38 template is placed, lug  holes are drilled, trial patella is placed, and it tracks normally. Osteophytes are removed off the posterior femur with the trial in place. All trials are removed and the cut bone surfaces prepared with pulsatile lavage. Cement is mixed and once ready for implantation, the size 4 tibial implant, size  4 posterior stabilized femoral component, and the size 38 patella are cemented in place and the patella is held with the clamp. The trial insert is placed and the knee held in full extension. The Exparel (20 ml mixed with 60 ml saline) is injected into the extensor mechanism, posterior capsule, medial and lateral gutters and subcutaneous tissues.  All extruded cement is removed and once the cement is hard the permanent 12.5 mm posterior stabilized rotating platform insert is placed into the tibial tray.      The wound is copiously irrigated with saline solution and the extensor mechanism closed over a hemovac drain with #1 V-loc suture. The tourniquet is released for a total tourniquet time of 35  minutes. Flexion against gravity is 140 degrees and the patella tracks normally. Subcutaneous tissue is closed with 2.0 vicryl and subcuticular with running 4.0 Monocryl. The incision is cleaned and dried and steri-strips and a bulky sterile dressing are applied. The limb is placed into a knee immobilizer and the patient is awakened and transported to recovery in stable condition.      Please note that a surgical assistant was a medical necessity for this procedure in order to perform it in a safe and expeditious manner. Surgical assistant was necessary to retract the ligaments and vital neurovascular structures to prevent injury to them and also necessary for proper positioning of the limb to allow for anatomic placement of the prosthesis.   Dione Plover Nassim Cosma, MD    04/12/2018, 12:37 PM

## 2018-04-12 NOTE — Anesthesia Postprocedure Evaluation (Signed)
Anesthesia Post Note  Patient: Kayla Guerrero  Procedure(s) Performed: LEFT TOTAL KNEE ARTHROPLASTY (Left Knee)     Patient location during evaluation: PACU Anesthesia Type: Spinal Level of consciousness: awake and alert Pain management: pain level controlled Vital Signs Assessment: post-procedure vital signs reviewed and stable Respiratory status: spontaneous breathing and respiratory function stable Cardiovascular status: blood pressure returned to baseline and stable Postop Assessment: spinal receding Anesthetic complications: no    Last Vitals:  Vitals:   04/12/18 1500 04/12/18 1502  BP: (!) 179/90 (!) 157/90  Pulse: (!) 57 (!) 57  Resp: 16   Temp: (!) 36.4 C   SpO2: 97% 100%    Last Pain:  Vitals:   04/12/18 1500  TempSrc: Oral  PainSc:                  Tiajuana Amass

## 2018-04-12 NOTE — Progress Notes (Signed)
Took report from [place] for lunch relief.  Patient resting.  Gave patient a sip of ice water and about 5 minutes later she c/o severe chest pain.  Called Dr Verita Lamb to bedside.  ECG done.  Patient sat up.  Patient's chest pain resolved as well.

## 2018-04-12 NOTE — Progress Notes (Signed)
Assisted Dr. Rob Fitzgerald with left, ultrasound guided, adductor canal block. Side rails up, monitors on throughout procedure. See vital signs in flow sheet. Tolerated Procedure well.  

## 2018-04-12 NOTE — Anesthesia Procedure Notes (Signed)
Anesthesia Regional Block: Adductor canal block   Pre-Anesthetic Checklist: ,, timeout performed, Correct Patient, Correct Site, Correct Laterality, Correct Procedure, Correct Position, site marked, Risks and benefits discussed,  Surgical consent,  Pre-op evaluation,  At surgeon's request and post-op pain management  Laterality: Left  Prep: chloraprep       Needles:  Injection technique: Single-shot  Needle Type: Echogenic Needle     Needle Length: 9cm  Needle Gauge: 21     Additional Needles:   Procedures:,,,, ultrasound used (permanent image in chart),,,,  Narrative:  Start time: 04/12/2018 10:56 AM End time: 04/12/2018 11:02 AM Injection made incrementally with aspirations every 5 mL.  Performed by: Personally  Anesthesiologist: Suzette Battiest, MD

## 2018-04-12 NOTE — Transfer of Care (Signed)
Immediate Anesthesia Transfer of Care Note  Patient: Kayla Guerrero  Procedure(s) Performed: LEFT TOTAL KNEE ARTHROPLASTY (Left Knee)  Patient Location: PACU  Anesthesia Type:Regional and Spinal  Level of Consciousness: awake, alert  and oriented  Airway & Oxygen Therapy: Patient Spontanous Breathing and Patient connected to face mask oxygen  Post-op Assessment: Report given to RN and Post -op Vital signs reviewed and stable  Post vital signs: Reviewed and stable  Last Vitals:  Vitals Value Taken Time  BP    Temp    Pulse    Resp    SpO2      Last Pain:  Vitals:   04/12/18 0958  TempSrc:   PainSc: 0-No pain         Complications: No apparent anesthesia complications

## 2018-04-12 NOTE — Anesthesia Procedure Notes (Signed)
Spinal  Patient location during procedure: OR End time: 04/12/2018 11:38 AM Staffing Anesthesiologist: Suzette Battiest, MD Resident/CRNA: Maxwell Caul, CRNA Performed: resident/CRNA  Preanesthetic Checklist Completed: patient identified, site marked, surgical consent, pre-op evaluation, timeout performed, IV checked, risks and benefits discussed and monitors and equipment checked Spinal Block Patient position: sitting Prep: DuraPrep Patient monitoring: heart rate, continuous pulse ox and blood pressure Approach: midline Location: L3-4 Injection technique: single-shot Needle Needle type: Pencan  Needle gauge: 24 G Needle length: 10 cm Additional Notes Patient sitting for SAB placement. Negative heme/paresthesia. Patient tolerated well. Dr Ola Spurr at bedside during entire placement. LOT # [id number]. Expiration date 03-30-2019.

## 2018-04-12 NOTE — Interval H&P Note (Signed)
History and Physical Interval Note:  04/12/2018 10:44 AM  Kayla Guerrero  has presented today for surgery, with the diagnosis of Osteoarthritis Left Knee  The various methods of treatment have been discussed with the patient and family. After consideration of risks, benefits and other options for treatment, the patient has consented to  Procedure(s): LEFT TOTAL KNEE ARTHROPLASTY (Left) as a surgical intervention .  The patient's history has been reviewed, patient examined, no change in status, stable for surgery.  I have reviewed the patient's chart and labs.  Questions were answered to the patient's satisfaction.     Pilar Plate Porter Nakama

## 2018-04-12 NOTE — Anesthesia Preprocedure Evaluation (Signed)
Anesthesia Evaluation  Patient identified by MRN, date of birth, ID band Patient awake    Reviewed: Allergy & Precautions, NPO status , Patient's Chart, lab work & pertinent test results  Airway Mallampati: II  TM Distance: >3 FB Neck ROM: Full    Dental   Pulmonary former smoker,    breath sounds clear to auscultation       Cardiovascular + Peripheral Vascular Disease   Rhythm:Regular Rate:Normal     Neuro/Psych  Neuromuscular disease    GI/Hepatic Neg liver ROS, hiatal hernia, GERD  ,  Endo/Other  Hypothyroidism   Renal/GU Renal InsufficiencyRenal disease     Musculoskeletal  (+) Arthritis ,   Abdominal   Peds  Hematology negative hematology ROS (+)   Anesthesia Other Findings   Reproductive/Obstetrics                             Lab Results  Component Value Date   WBC 4.4 04/05/2018   HGB 12.9 04/05/2018   HCT 39.5 04/05/2018   MCV 88.6 04/05/2018   PLT 312 04/05/2018   Lab Results  Component Value Date   CREATININE 1.59 (H) 04/05/2018   BUN 29 (H) 04/05/2018   NA 142 04/05/2018   K 5.5 (H) 04/05/2018   CL 106 04/05/2018   CO2 28 04/05/2018   Lab Results  Component Value Date   INR 1.05 04/05/2018   INR 1.01 03/23/2018   INR 1.26 02/18/2017    Anesthesia Physical Anesthesia Plan  ASA: II  Anesthesia Plan: MAC and Spinal   Post-op Pain Management:  Regional for Post-op pain   Induction: Intravenous  PONV Risk Score and Plan: 2 and Propofol infusion, Ondansetron and Treatment may vary due to age or medical condition  Airway Management Planned: Natural Airway and Simple Face Mask  Additional Equipment:   Intra-op Plan:   Post-operative Plan:   Informed Consent: I have reviewed the patients History and Physical, chart, labs and discussed the procedure including the risks, benefits and alternatives for the proposed anesthesia with the patient or authorized  representative who has indicated his/her understanding and acceptance.     Plan Discussed with: CRNA  Anesthesia Plan Comments:         Anesthesia Quick Evaluation

## 2018-04-12 NOTE — Plan of Care (Signed)
  Problem: Health Behavior/Discharge Planning: Goal: Ability to manage health-related needs will improve Outcome: Progressing   Problem: Coping: Goal: Level of anxiety will decrease Outcome: Progressing   Problem: Elimination: Goal: Will not experience complications related to bowel motility Outcome: Progressing   Problem: Elimination: Goal: Will not experience complications related to urinary retention Outcome: Progressing   Problem: Skin Integrity: Goal: Risk for impaired skin integrity will decrease Outcome: Progressing

## 2018-04-12 NOTE — Discharge Instructions (Signed)

## 2018-04-13 DIAGNOSIS — M1712 Unilateral primary osteoarthritis, left knee: Secondary | ICD-10-CM | POA: Diagnosis not present

## 2018-04-13 LAB — CBC
HCT: 31.7 % — ABNORMAL LOW (ref 36.0–46.0)
Hemoglobin: 10.4 g/dL — ABNORMAL LOW (ref 12.0–15.0)
MCH: 29.3 pg (ref 26.0–34.0)
MCHC: 32.8 g/dL (ref 30.0–36.0)
MCV: 89.3 fL (ref 78.0–100.0)
Platelets: 257 10*3/uL (ref 150–400)
RBC: 3.55 MIL/uL — ABNORMAL LOW (ref 3.87–5.11)
RDW: 13.5 % (ref 11.5–15.5)
WBC: 7.7 10*3/uL (ref 4.0–10.5)

## 2018-04-13 LAB — BASIC METABOLIC PANEL
Anion gap: 7 (ref 5–15)
BUN: 21 mg/dL (ref 8–23)
CHLORIDE: 105 mmol/L (ref 98–111)
CO2: 25 mmol/L (ref 22–32)
CREATININE: 1.32 mg/dL — AB (ref 0.44–1.00)
Calcium: 8.1 mg/dL — ABNORMAL LOW (ref 8.9–10.3)
GFR calc non Af Amer: 37 mL/min — ABNORMAL LOW (ref >60)
GFR, EST AFRICAN AMERICAN: 43 mL/min — AB (ref >60)
Glucose, Bld: 106 mg/dL — ABNORMAL HIGH (ref 70–99)
POTASSIUM: 4.2 mmol/L (ref 3.5–5.1)
Sodium: 137 mmol/L (ref 135–145)

## 2018-04-13 NOTE — Care Management Obs Status (Signed)
[facility] NOTIFICATION   Patient Details  Name: Kayla Guerrero MRN: [id number] Date of Birth: Oct 08, 1934   Medicare Observation Status Notification Given:  Yes    Guadalupe Maple, RN 04/13/2018, 5:56 PM

## 2018-04-13 NOTE — Care Management CC44 (Signed)
Condition Code 44 Documentation Completed  Patient Details  Name: SUMMAR MCGLOTHLIN MRN: [id number] Date of Birth: [birth year]   Condition Code 44 given:  Yes Patient signature on Condition Code 44 notice:  Yes Documentation of 2 MD's agreement:  Yes Code 44 added to claim:  Yes    Guadalupe Maple, RN 04/13/2018, 5:56 PM

## 2018-04-13 NOTE — Evaluation (Signed)
Physical Therapy Evaluation Patient Details Name: Kayla Guerrero MRN: [id number] DOB: Nov 09, 1934 Today's Date: 04/13/2018   History of Present Illness  83 yo female s/p L TKA 04/12/18. Hx of neuropthy, L foot drop, endometrial ca, colon ca, OA, spinal stenosis, osteopenia    Clinical Impression  On eval, pt required Mod assist for mobility. She walked ~55 feet with a RW. Moderate pain with activity. Pt fatigues fairly easily. Will follow and progress activity as tolerated. Pt stated she is planning to d/c to rehab section of [facility].     Follow Up Recommendations Follow surgeon's recommendation for DC plan and follow-up therapies    Equipment Recommendations  None recommended by PT    Recommendations for Other Services       Precautions / Restrictions Precautions Precautions: Fall;Knee Required Braces or Orthoses: Knee Immobilizer - Left Knee Immobilizer - Left: Discontinue once straight leg raise with < 10 degree lag Restrictions Weight Bearing Restrictions: No Other Position/Activity Restrictions: WBAT      Mobility  Bed Mobility               General bed mobility comments: oob in recliner  Transfers Overall transfer level: Needs assistance Equipment used: Rolling walker (2 wheeled) Transfers: Sit to/from Stand Sit to Stand: Mod assist         General transfer comment: LOB x 1. Assist to rise, stabilize, control descent. VCs safety, technique, hand/LE placement. Increased time.   Ambulation/Gait Ambulation/Gait assistance: Min assist Gait Distance (Feet): 55 Feet Assistive device: Rolling walker (2 wheeled) Gait Pattern/deviations: Step-to pattern;Trunk flexed;Antalgic     General Gait Details: VCS safety, technique, sequence, proper use of RW. Assist to stabilize pt throughout distance. Pt c/o mild lightheadedness. Followed with recliner for safety.   Stairs            Wheelchair Mobility    Modified Rankin (Stroke Patients Only)        Balance Overall balance assessment: Needs assistance         Standing balance support: Bilateral upper extremity supported Standing balance-Leahy Scale: Poor                               Pertinent Vitals/Pain Pain Assessment: 0-10 Pain Score: 7  Pain Location: L knee Pain Descriptors / Indicators: Aching;Sore;Discomfort Pain Intervention(s): Monitored during session;Repositioned;Ice applied    Home Living Family/patient expects to be discharged to:: Unsure Living Arrangements: Spouse/significant other   Type of Home: Apartment([place])       Home Layout: One level Home Equipment: None      Prior Function Level of Independence: Independent               Hand Dominance        Extremity/Trunk Assessment   Upper Extremity Assessment Upper Extremity Assessment: Overall WFL for tasks assessed    Lower Extremity Assessment Lower Extremity Assessment: Generalized weakness(s/p L TKA)    Cervical / Trunk Assessment Cervical / Trunk Assessment: Normal  Communication   Communication: No difficulties  Cognition Arousal/Alertness: Awake/alert Behavior During Therapy: WFL for tasks assessed/performed Overall Cognitive Status: Within Functional Limits for tasks assessed                                        General Comments      Exercises Total Joint Exercises Ankle Circles/Pumps: AROM;Both;10 reps;Supine  Quad Sets: AROM;Both;10 reps;Supine Heel Slides: AAROM;Left;10 reps;Supine Hip ABduction/ADduction: AAROM;Left;10 reps;Supine Straight Leg Raises: AAROM;Left;10 reps;Supine Goniometric ROM: ~10-60 degrees   Assessment/Plan    PT Assessment Patient needs continued PT services  PT Problem List Decreased strength;Decreased range of motion;Decreased balance;Decreased mobility;Decreased cognition;Decreased activity tolerance;Decreased knowledge of use of DME;Pain       PT Treatment Interventions DME instruction;Gait  training;Functional mobility training;Therapeutic activities;Patient/family education;Balance training;Therapeutic exercise    PT Goals (Current goals can be found in the Care Plan section)  Acute Rehab PT Goals Patient Stated Goal: regain independence.  PT Goal Formulation: With patient/family Time For Goal Achievement: 04/27/18 Potential to Achieve Goals: Good    Frequency 7X/week   Barriers to discharge        Co-evaluation               AM-PAC PT "6 Clicks" Daily Activity  Outcome Measure Difficulty turning over in bed (including adjusting bedclothes, sheets and blankets)?: Unable Difficulty moving from lying on back to sitting on the side of the bed? : Unable Difficulty sitting down on and standing up from a chair with arms (e.g., wheelchair, bedside commode, etc,.)?: Unable Help needed moving to and from a bed to chair (including a wheelchair)?: A Lot Help needed walking in hospital room?: A Lot Help needed climbing 3-5 steps with a railing? : A Lot 6 Click Score: 9    End of Session Equipment Utilized During Treatment: Gait belt;Left knee immobilizer Activity Tolerance: Patient limited by fatigue;Patient limited by pain Patient left: in chair;with call bell/phone within reach;with family/visitor present   PT Visit Diagnosis: Muscle weakness (generalized) (M62.81);Pain;Other abnormalities of gait and mobility (R26.89) Pain - Right/Left: Left Pain - part of body: Knee    Time: 1000-1021 PT Time Calculation (min) (ACUTE ONLY): 21 min   Charges:   PT Evaluation $PT Eval Low Complexity: 1 Low     PT G Codes:          Weston Anna, MPT Pager: [phone]

## 2018-04-13 NOTE — Progress Notes (Signed)
This CM spoke to pt at bedside for DC planning. Pt states she has a RW and 3in1 at home. She states she lives at [facility] and has arranged to go to rehab side of facility when discharged. CSW informed of plan. Marney Doctor RN,BSN,NCM [phone]

## 2018-04-13 NOTE — Progress Notes (Signed)
Physical Therapy Treatment Patient Details Name: Kayla Guerrero MRN: [id number] DOB: [birth year] Today's Date: 04/13/2018    History of Present Illness 83 yo female s/p L TKA 04/12/18. Hx of neuropthy, L foot drop, endometrial ca, colon ca, OA, spinal stenosis, osteopenia    PT Comments    Progressing with mobility.    Follow Up Recommendations  Follow surgeon's recommendation for DC plan and follow-up therapies     Equipment Recommendations  None recommended by PT    Recommendations for Other Services       Precautions / Restrictions Precautions Precautions: Fall;Knee Required Braces or Orthoses: Knee Immobilizer - Left Knee Immobilizer - Left: Discontinue once straight leg raise with < 10 degree lag Restrictions Weight Bearing Restrictions: No LLE Weight Bearing: Weight bearing as tolerated    Mobility  Bed Mobility Overal bed mobility: Needs Assistance Bed Mobility: Sit to Supine       Sit to supine: Min assist;HOB elevated   General bed mobility comments: Assist for L LE.   Transfers Overall transfer level: Needs assistance Equipment used: Rolling walker (2 wheeled) Transfers: Sit to/from Stand Sit to Stand: Min assist         General transfer comment: Assist to rise, stabilize, control descent. VCs safety, technique, hand/LE placement. Increased time to rise and stabilize.   Ambulation/Gait Ambulation/Gait assistance: Min assist Gait Distance (Feet): 60 Feet Assistive device: Rolling walker (2 wheeled) Gait Pattern/deviations: Step-to pattern;Trunk flexed;Antalgic     General Gait Details: VCS safety, technique, sequence, proper use of RW. Assist to stabilize pt throughout distance. Improved activity tolerance this session   Stairs             Wheelchair Mobility    Modified Rankin (Stroke Patients Only)       Balance Overall balance assessment: Needs assistance         Standing balance support: Bilateral upper extremity  supported Standing balance-Leahy Scale: Poor                              Cognition Arousal/Alertness: Awake/alert Behavior During Therapy: WFL for tasks assessed/performed Overall Cognitive Status: Within Functional Limits for tasks assessed                                        Exercises      General Comments        Pertinent Vitals/Pain Pain Assessment: 0-10 Pain Score: 5  Pain Location: L knee Pain Descriptors / Indicators: Aching;Sore;Discomfort Pain Intervention(s): Monitored during session;Repositioned;Ice applied    Home Living                      Prior Function            PT Goals (current goals can now be found in the care plan section) Progress towards PT goals: Progressing toward goals    Frequency    7X/week      PT Plan Current plan remains appropriate    Co-evaluation              AM-PAC PT "6 Clicks" Daily Activity  Outcome Measure  Difficulty turning over in bed (including adjusting bedclothes, sheets and blankets)?: A Lot Difficulty moving from lying on back to sitting on the side of the bed? : Unable Difficulty sitting down on and standing up from a  chair with arms (e.g., wheelchair, bedside commode, etc,.)?: Unable Help needed moving to and from a bed to chair (including a wheelchair)?: A Little Help needed walking in hospital room?: A Little Help needed climbing 3-5 steps with a railing? : A Lot 6 Click Score: 12    End of Session Equipment Utilized During Treatment: Gait belt;Left knee immobilizer Activity Tolerance: Patient tolerated treatment well Patient left: in bed;with call bell/phone within reach   PT Visit Diagnosis: Muscle weakness (generalized) (M62.81);Pain;Other abnormalities of gait and mobility (R26.89);Difficulty in walking, not elsewhere classified (R26.2) Pain - Right/Left: Left Pain - part of body: Knee     Time: 8882-8003 PT Time Calculation (min) (ACUTE ONLY): 14  min  Charges:  $Gait Training: 8-22 mins                    G Codes:         Weston Anna, MPT Pager: [phone]

## 2018-04-13 NOTE — Progress Notes (Signed)
Subjective: 1 Day Post-Op Procedure(s) (LRB): LEFT TOTAL KNEE ARTHROPLASTY (Left) Patient reports pain as mild.   Patient seen in rounds with Dr. Wynelle Link. Patient is well, and has had no acute complaints or problems. Did not sleep well last night but feeling ok. No SOB or chest pain.  Plan is to go Home after hospital stay.  Objective: Vital signs in last 24 hours: Temp:  [97.3 F (36.3 C)-98.2 F (36.8 C)] 97.6 F (36.4 C) (07/16 0949) Pulse Rate:  [39-79] 71 (07/16 0949) Resp:  [8-17] 14 (07/16 0949) BP: (86-190)/(42-90) 119/61 (07/16 0949) SpO2:  [94 %-100 %] 97 % (07/16 0949)  Intake/Output from previous day:  Intake/Output Summary (Last 24 hours) at 04/13/2018 1138 Last data filed at 04/13/2018 0845 Gross per 24 hour  Intake 2527 ml  Output 1405 ml  Net 1122 ml    Intake/Output this shift: Total I/O In: 120 [P.O.:120] Out: 125 [Urine:125]  Labs: Recent Labs    04/13/18 0559  HGB 10.4*   Recent Labs    04/13/18 0559  WBC 7.7  RBC 3.55*  HCT 31.7*  PLT 257   Recent Labs    04/13/18 0559  NA 137  K 4.2  CL 105  CO2 25  BUN 21  CREATININE 1.32*  GLUCOSE 106*  CALCIUM 8.1*    EXAM General - Patient is Alert and Oriented Extremity - Neurologically intact Intact pulses distally Dorsiflexion/Plantar flexion intact Compartment soft Dressing - dressing C/D/I Motor Function - intact, moving foot and toes well on exam.  Hemovac pulled without difficulty.  Past Medical History:  Diagnosis Date  . Aortic atherosclerosis ([place])   . Arthritis   . Arthritis of knee, left   . Borderline osteopenia DEXA 2006 and 2012  . Cancer [facility])    endometrial  . Cataract   . Cholesterol serum elevated   . Colon polyps   . DDD (degenerative disc disease), lumbar   . DDD (degenerative disc disease), lumbar   . Diverticulitis of sigmoid colon    Moderate  . Facet arthropathy, lumbar   . FHx: colon cancer   . GERD (gastroesophageal reflux disease)   .  History of hiatal hernia    Moderate  . History of radiation therapy 08/06/16 - 09/03/16   vaginal cuff treated to 30 Gy in 5 fractions  . Hypothyroidism   . Kidney lesion, native, right   . Left foot drop   . Leukopenia   . Neuropathy    chemo indiced ; in right hand and bilateral feet   . Obesity   . Pelvic abscess in female 01/2017   Right  . Spondylisthesis   . Umbilical hernia     Assessment/Plan: 1 Day Post-Op Procedure(s) (LRB): LEFT TOTAL KNEE ARTHROPLASTY (Left) Principal Problem:   OA (osteoarthritis) of knee  Estimated body mass index is 31.31 kg/m as calculated from the following:   Height as of this encounter: 5\' 6"  (1.676 m).   Weight as of this encounter: 88 kg (194 lb). Advance diet Up with therapy D/C IV fluids when tolerating POs well  Anticipated LOS equal to or greater than 2 midnights due to - Age 81 and older with one or more of the following:  - Obesity  - Expected need for hospital services (PT, OT, Nursing) required for safe  discharge  - Anticipated need for postoperative skilled nursing care or inpatient rehab  - Active co-morbidities: Coronary Artery Disease OR   - Unanticipated findings during/Post Surgery: None  -  Patient is a high risk of re-admission due to: None   DVT Prophylaxis - Aspirin Weight-Bearing as tolerated D/C O2 and Pulse OX and try on Room Air  Plan to start therapy this morning. Plan for DC home tomorrow if she is meeting goals.   Ardeen Jourdain, PA-C Orthopaedic Surgery 04/13/2018, 11:38 AM

## 2018-04-14 ENCOUNTER — Encounter (HOSPITAL_COMMUNITY): Payer: Self-pay | Admitting: Orthopedic Surgery

## 2018-04-14 DIAGNOSIS — M1712 Unilateral primary osteoarthritis, left knee: Secondary | ICD-10-CM | POA: Diagnosis not present

## 2018-04-14 LAB — CBC
HEMATOCRIT: 29.4 % — AB (ref 36.0–46.0)
Hemoglobin: 9.6 g/dL — ABNORMAL LOW (ref 12.0–15.0)
MCH: 29.3 pg (ref 26.0–34.0)
MCHC: 32.7 g/dL (ref 30.0–36.0)
MCV: 89.6 fL (ref 78.0–100.0)
PLATELETS: 227 10*3/uL (ref 150–400)
RBC: 3.28 MIL/uL — ABNORMAL LOW (ref 3.87–5.11)
RDW: 13.8 % (ref 11.5–15.5)
WBC: 5.5 10*3/uL (ref 4.0–10.5)

## 2018-04-14 LAB — BASIC METABOLIC PANEL
ANION GAP: 7 (ref 5–15)
BUN: 22 mg/dL (ref 8–23)
CALCIUM: 8.3 mg/dL — AB (ref 8.9–10.3)
CO2: 27 mmol/L (ref 22–32)
Chloride: 104 mmol/L (ref 98–111)
Creatinine, Ser: 1.38 mg/dL — ABNORMAL HIGH (ref 0.44–1.00)
GFR calc Af Amer: 40 mL/min — ABNORMAL LOW (ref >60)
GFR, EST NON AFRICAN AMERICAN: 35 mL/min — AB (ref >60)
GLUCOSE: 98 mg/dL (ref 70–99)
POTASSIUM: 3.8 mmol/L (ref 3.5–5.1)
Sodium: 138 mmol/L (ref 135–145)

## 2018-04-14 MED ORDER — OXYCODONE HCL 5 MG PO TABS: 5.0000 mg | ORAL_TABLET | Freq: Four times a day (QID) | ORAL | 0 refills | Status: DC | PRN

## 2018-04-14 MED ORDER — METHOCARBAMOL 500 MG PO TABS: 500.0000 mg | ORAL_TABLET | Freq: Four times a day (QID) | ORAL | 0 refills | Status: DC | PRN

## 2018-04-14 MED ORDER — ASPIRIN 325 MG PO TBEC: 325.0000 mg | DELAYED_RELEASE_TABLET | Freq: Two times a day (BID) | ORAL | 0 refills | Status: DC

## 2018-04-14 NOTE — Progress Notes (Signed)
Physical Therapy Treatment Patient Details Name: Kayla Guerrero MRN: [id number] DOB: [birth year] Today's Date: 04/14/2018    History of Present Illness 83 yo female s/p L TKA 04/12/18. Hx of neuropthy, L foot drop, endometrial ca, colon ca, OA, spinal stenosis, osteopenia    PT Comments    Progressing slowly with mobility. Pt c/o dizziness and fatigue on today. She was unable to make it back to her room so recliner was used to transport pt. Will continue to progress activity as pt is able to tolerate.    Follow Up Recommendations  Follow surgeon's recommendation for DC plan and follow-up therapies     Equipment Recommendations  None recommended by PT    Recommendations for Other Services       Precautions / Restrictions Precautions Precautions: Fall;Knee Required Braces or Orthoses: Knee Immobilizer - Left Knee Immobilizer - Left: Discontinue once straight leg raise with < 10 degree lag Restrictions Weight Bearing Restrictions: No LLE Weight Bearing: Weight bearing as tolerated    Mobility  Bed Mobility Overal bed mobility: Needs Assistance Bed Mobility: Supine to Sit     Supine to sit: Min assist     General bed mobility comments: Assist for L LE.   Transfers Overall transfer level: Needs assistance Equipment used: Rolling walker (2 wheeled) Transfers: Sit to/from Stand Sit to Stand: Min assist         General transfer comment: Assist to rise, stabilize, control descent. VCs safety, technique, hand/LE placement. Increased time to rise and stabilize.   Ambulation/Gait Ambulation/Gait assistance: Min assist Gait Distance (Feet): 45 Feet Assistive device: Rolling walker (2 wheeled) Gait Pattern/deviations: Step-to pattern;Trunk flexed;Antalgic     General Gait Details: VCS safety, technique, sequence, proper use of RW. Assist to stabilize pt throughout distance. Pt c/o fatigue and dizziness. She was unable to make it back to her room so nursing brought  recliner to her in hallway.    Stairs             Wheelchair Mobility    Modified Rankin (Stroke Patients Only)       Balance                                            Cognition Arousal/Alertness: Awake/alert Behavior During Therapy: WFL for tasks assessed/performed Overall Cognitive Status: Within Functional Limits for tasks assessed                                        Exercises Total Joint Exercises Ankle Circles/Pumps: AROM;Both;10 reps;Supine Quad Sets: AROM;Both;10 reps;Supine Heel Slides: AAROM;Left;10 reps;Supine Hip ABduction/ADduction: AAROM;Left;10 reps;Supine Straight Leg Raises: AAROM;Left;10 reps;Supine Goniometric ROM: ~10-60 degrees    General Comments        Pertinent Vitals/Pain Pain Assessment: 0-10 Pain Score: 6  Pain Location: L knee Pain Descriptors / Indicators: Aching;Sore;Discomfort Pain Intervention(s): Limited activity within patient's tolerance;Repositioned;Ice applied    Home Living                      Prior Function            PT Goals (current goals can now be found in the care plan section) Progress towards PT goals: Progressing toward goals    Frequency    7X/week  PT Plan Current plan remains appropriate    Co-evaluation              AM-PAC PT "6 Clicks" Daily Activity  Outcome Measure  Difficulty turning over in bed (including adjusting bedclothes, sheets and blankets)?: A Lot Difficulty moving from lying on back to sitting on the side of the bed? : Unable Difficulty sitting down on and standing up from a chair with arms (e.g., wheelchair, bedside commode, etc,.)?: Unable Help needed moving to and from a bed to chair (including a wheelchair)?: A Little Help needed walking in hospital room?: A Little Help needed climbing 3-5 steps with a railing? : A Lot 6 Click Score: 12    End of Session Equipment Utilized During Treatment: Gait belt;Left knee  immobilizer Activity Tolerance: Patient tolerated treatment well Patient left: in bed;with call bell/phone within reach   PT Visit Diagnosis: Muscle weakness (generalized) (M62.81);Pain;Other abnormalities of gait and mobility (R26.89);Difficulty in walking, not elsewhere classified (R26.2) Pain - Right/Left: Left Pain - part of body: Knee     Time: 0902-0926 PT Time Calculation (min) (ACUTE ONLY): 24 min  Charges:  $Gait Training: 8-22 mins $Therapeutic Exercise: 8-22 mins                    G Codes:        Weston Anna, MPT Pager: [phone]

## 2018-04-14 NOTE — Progress Notes (Signed)
Physical Therapy Treatment Patient Details Name: KRISNA OMAR MRN: [id number] DOB: May 07, 1935 Today's Date: 04/14/2018    History of Present Illness 83 yo female s/p L TKA 04/12/18. Hx of neuropthy, L foot drop, endometrial ca, colon ca, OA, spinal stenosis, osteopenia    PT Comments    Progressing with mobility.    Follow Up Recommendations  Follow surgeon's recommendation for DC plan and follow-up therapies     Equipment Recommendations  None recommended by PT    Recommendations for Other Services       Precautions / Restrictions Precautions Precautions: Fall;Knee Required Braces or Orthoses: Knee Immobilizer - Left Knee Immobilizer - Left: Discontinue once straight leg raise with < 10 degree lag Restrictions Weight Bearing Restrictions: No LLE Weight Bearing: Weight bearing as tolerated    Mobility  Bed Mobility Overal bed mobility: Needs Assistance Bed Mobility: Sit to Supine       Sit to supine: Min assist   General bed mobility comments: Assist for L LE.   Transfers Overall transfer level: Needs assistance Equipment used: Rolling walker (2 wheeled) Transfers: Sit to/from Stand Sit to Stand: Min assist         General transfer comment: Assist to rise, stabilize, control descent. VCs safety, technique, hand/LE placement. Increased time to rise and stabilize. Unsteady.   Ambulation/Gait Ambulation/Gait assistance: Min guard Gait Distance (Feet): 100 Feet Assistive device: Rolling walker (2 wheeled) Gait Pattern/deviations: Step-to pattern     General Gait Details: Close guard for safety. Improved tolerance this session. No c/o dizziness/lightheadedness.    Stairs             Wheelchair Mobility    Modified Rankin (Stroke Patients Only)       Balance                                            Cognition Arousal/Alertness: Awake/alert Behavior During Therapy: WFL for tasks assessed/performed Overall Cognitive  Status: Within Functional Limits for tasks assessed                                        Exercises      General Comments        Pertinent Vitals/Pain Pain Assessment: 0-10 Pain Score: 4  Pain Location: L knee Pain Descriptors / Indicators: Aching;Sore;Discomfort Pain Intervention(s): Monitored during session;Repositioned;Ice applied    Home Living                      Prior Function            PT Goals (current goals can now be found in the care plan section) Progress towards PT goals: Progressing toward goals    Frequency    7X/week      PT Plan      Co-evaluation              AM-PAC PT "6 Clicks" Daily Activity  Outcome Measure  Difficulty turning over in bed (including adjusting bedclothes, sheets and blankets)?: A Little Difficulty moving from lying on back to sitting on the side of the bed? : Unable Difficulty sitting down on and standing up from a chair with arms (e.g., wheelchair, bedside commode, etc,.)?: Unable Help needed moving to and from a bed to chair (including  a wheelchair)?: A Little Help needed walking in hospital room?: A Little Help needed climbing 3-5 steps with a railing? : A Lot 6 Click Score: 13    End of Session Equipment Utilized During Treatment: Gait belt;Left knee immobilizer Activity Tolerance: Patient tolerated treatment well Patient left: in bed;with call bell/phone within reach   PT Visit Diagnosis: Muscle weakness (generalized) (M62.81);Pain;Other abnormalities of gait and mobility (R26.89);Difficulty in walking, not elsewhere classified (R26.2) Pain - Right/Left: Left Pain - part of body: Knee     Time: 1533-1550 PT Time Calculation (min) (ACUTE ONLY): 17 min  Charges:  $Gait Training: 8-22 mins                    G Codes:          Weston Anna, MPT Pager: [phone]

## 2018-04-14 NOTE — Progress Notes (Signed)
   Subjective: 2 Days Post-Op Procedure(s) (LRB): LEFT TOTAL KNEE ARTHROPLASTY (Left) Patient reports pain as mild.   Patient seen in rounds with Dr. Wynelle Link. Patient is well, and has had no acute complaints or problems. No SOB or chest pain. No issues overnight.  Plan is to go Home after hospital stay.  Objective: Vital signs in last 24 hours: Temp:  [97.6 F (36.4 C)-98.3 F (36.8 C)] 97.6 F (36.4 C) (07/17 1458) Pulse Rate:  [71-82] 71 (07/17 1458) Resp:  [16] 16 (07/17 1458) BP: (137-147)/(63-73) 137/63 (07/17 1458) SpO2:  [94 %-99 %] 99 % (07/17 1458)  Intake/Output from previous day:  Intake/Output Summary (Last 24 hours) at 04/14/2018 1656 Last data filed at 04/14/2018 1459 Gross per 24 hour  Intake 720 ml  Output -  Net 720 ml    Intake/Output this shift: Total I/O In: 360 [P.O.:360] Out: -   Labs: Recent Labs    04/13/18 0559 04/14/18 0552  HGB 10.4* 9.6*   Recent Labs    04/13/18 0559 04/14/18 0552  WBC 7.7 5.5  RBC 3.55* 3.28*  HCT 31.7* 29.4*  PLT 257 227   Recent Labs    04/13/18 0559 04/14/18 0552  NA 137 138  K 4.2 3.8  CL 105 104  CO2 25 27  BUN 21 22  CREATININE 1.32* 1.38*  GLUCOSE 106* 98  CALCIUM 8.1* 8.3*    EXAM General - Patient is Alert and Oriented Extremity - Neurologically intact Intact pulses distally Dorsiflexion/Plantar flexion intact No cellulitis present Compartment soft Dressing/Incision - clean, dry, no drainage Motor Function - intact, moving foot and toes well on exam.   Past Medical History:  Diagnosis Date  . Aortic atherosclerosis ([place])   . Arthritis   . Arthritis of knee, left   . Borderline osteopenia DEXA 2006 and 2012  . Cancer [facility])    endometrial  . Cataract   . Cholesterol serum elevated   . Colon polyps   . DDD (degenerative disc disease), lumbar   . DDD (degenerative disc disease), lumbar   . Diverticulitis of sigmoid colon    Moderate  . Facet arthropathy, lumbar   . FHx: colon  cancer   . GERD (gastroesophageal reflux disease)   . History of hiatal hernia    Moderate  . History of radiation therapy 08/06/16 - 09/03/16   vaginal cuff treated to 30 Gy in 5 fractions  . Hypothyroidism   . Kidney lesion, native, right   . Left foot drop   . Leukopenia   . Neuropathy    chemo indiced ; in right hand and bilateral feet   . Obesity   . Pelvic abscess in female 01/2017   Right  . Spondylisthesis   . Umbilical hernia     Assessment/Plan: 2 Days Post-Op Procedure(s) (LRB): LEFT TOTAL KNEE ARTHROPLASTY (Left) Principal Problem:   OA (osteoarthritis) of knee  Estimated body mass index is 31.31 kg/m as calculated from the following:   Height as of this encounter: 5\' 6"  (1.676 m).   Weight as of this encounter: 88 kg (194 lb). Advance diet Up with therapy  DVT Prophylaxis - Aspirin Weight-Bearing as tolerated  Continue PT. Plan for DC home pending progress. Follow up in office in 2 weeks.   Ardeen Jourdain, PA-C Orthopaedic Surgery 04/14/2018, 4:56 PM

## 2018-04-14 NOTE — Discharge Summary (Signed)
Physician Discharge Summary   Patient ID: Kayla Guerrero MRN: [id number] DOB/AGE: 1935/03/16 83 y.o.  Admit date: 04/12/2018 Discharge date: 04/14/2018  Primary Diagnosis: Primary osteoarthritis left knee   Admission Diagnoses:  Past Medical History:  Diagnosis Date  . Aortic atherosclerosis ([place])   . Arthritis   . Arthritis of knee, left   . Borderline osteopenia DEXA 2006 and 2012  . Cancer [facility])    endometrial  . Cataract   . Cholesterol serum elevated   . Colon polyps   . DDD (degenerative disc disease), lumbar   . DDD (degenerative disc disease), lumbar   . Diverticulitis of sigmoid colon    Moderate  . Facet arthropathy, lumbar   . FHx: colon cancer   . GERD (gastroesophageal reflux disease)   . History of hiatal hernia    Moderate  . History of radiation therapy 08/06/16 - 09/03/16   vaginal cuff treated to 30 Gy in 5 fractions  . Hypothyroidism   . Kidney lesion, native, right   . Left foot drop   . Leukopenia   . Neuropathy    chemo indiced ; in right hand and bilateral feet   . Obesity   . Pelvic abscess in female 01/2017   Right  . Spondylisthesis   . Umbilical hernia    Discharge Diagnoses:   Principal Problem:   OA (osteoarthritis) of knee  Estimated body mass index is 31.31 kg/m as calculated from the following:   Height as of this encounter: '5\' 6"'$  (1.676 m).   Weight as of this encounter: 88 kg (194 lb).  Procedure:  Procedure(s) (LRB): LEFT TOTAL KNEE ARTHROPLASTY (Left)   Consults: None  HPI: Kayla Guerrero, 83 y.o. female, has a history of pain and functional disability in the left knee due to arthritis and has failed non-surgical conservative treatments for greater than 12 weeks to includeNSAID's and/or analgesics, corticosteriod injections, viscosupplementation injections and stem cell treatments.  Onset of symptoms was gradual, starting several years ago with gradually worsening up until the last six months when patient's pain worsened  significantly. The patient noted no past surgery on the left knee(s).  Patient currently rates pain in the left knee(s) at 8 out of 10 with activity. Patient has night pain, worsening of pain with activity and weight bearing, pain that interferes with activities of daily living and crepitus.  Patient has evidence of joint space narrowing in both the lateral and patellofemoral compartments by imaging studies. There is no active infection.     Laboratory Data: Admission on 04/12/2018, Discharged on 04/14/2018  Component Date Value Ref Range Status  . WBC 04/13/2018 7.7  4.0 - 10.5 K/uL Final  . RBC 04/13/2018 3.55* 3.87 - 5.11 MIL/uL Final  . Hemoglobin 04/13/2018 10.4* 12.0 - 15.0 g/dL Final  . HCT 04/13/2018 31.7* 36.0 - 46.0 % Final  . MCV 04/13/2018 89.3  78.0 - 100.0 fL Final  . MCH 04/13/2018 29.3  26.0 - 34.0 pg Final  . MCHC 04/13/2018 32.8  30.0 - 36.0 g/dL Final  . RDW 04/13/2018 13.5  11.5 - 15.5 % Final  . Platelets 04/13/2018 257  150 - 400 K/uL Final   Performed at [facility], [place] [address] [place], [place] [address]  . Sodium 04/13/2018 137  135 - 145 mmol/L Final  . Potassium 04/13/2018 4.2  3.5 - 5.1 mmol/L Final  . Chloride 04/13/2018 105  98 - 111 mmol/L Final   Please note change in reference range.  Kayla Guerrero  CO2 04/13/2018 25  22 - 32 mmol/L Final  . Glucose, Bld 04/13/2018 106* 70 - 99 mg/dL Final   Please note change in reference range.  . BUN 04/13/2018 21  8 - 23 mg/dL Final   Please note change in reference range.  . Creatinine, Ser 04/13/2018 1.32* 0.44 - 1.00 mg/dL Final  . Calcium 04/13/2018 8.1* 8.9 - 10.3 mg/dL Final  . GFR calc non Af Amer 04/13/2018 37* >60 mL/min Final  . GFR calc Af Amer 04/13/2018 43* >60 mL/min Final   Comment: (NOTE) The eGFR has been calculated using the CKD EPI equation. This calculation has not been validated in all clinical situations. eGFR's persistently <60 mL/min signify possible Chronic Kidney Disease.     Georgiann Hahn gap 04/13/2018 7  5 - 15 Final   Performed at [facility], [place] [address] [place], [place] [address]  . WBC 04/14/2018 5.5  4.0 - 10.5 K/uL Final  . RBC 04/14/2018 3.28* 3.87 - 5.11 MIL/uL Final  . Hemoglobin 04/14/2018 9.6* 12.0 - 15.0 g/dL Final  . HCT 04/14/2018 29.4* 36.0 - 46.0 % Final  . MCV 04/14/2018 89.6  78.0 - 100.0 fL Final  . MCH 04/14/2018 29.3  26.0 - 34.0 pg Final  . MCHC 04/14/2018 32.7  30.0 - 36.0 g/dL Final  . RDW 04/14/2018 13.8  11.5 - 15.5 % Final  . Platelets 04/14/2018 227  150 - 400 K/uL Final   Performed at [facility], [place] [address] [place], [place] [address]  . Sodium 04/14/2018 138  135 - 145 mmol/L Final  . Potassium 04/14/2018 3.8  3.5 - 5.1 mmol/L Final  . Chloride 04/14/2018 104  98 - 111 mmol/L Final   Please note change in reference range.  . CO2 04/14/2018 27  22 - 32 mmol/L Final  . Glucose, Bld 04/14/2018 98  70 - 99 mg/dL Final   Please note change in reference range.  . BUN 04/14/2018 22  8 - 23 mg/dL Final   Please note change in reference range.  . Creatinine, Ser 04/14/2018 1.38* 0.44 - 1.00 mg/dL Final  . Calcium 04/14/2018 8.3* 8.9 - 10.3 mg/dL Final  . GFR calc non Af Amer 04/14/2018 35* >60 mL/min Final  . GFR calc Af Amer 04/14/2018 40* >60 mL/min Final   Comment: (NOTE) The eGFR has been calculated using the CKD EPI equation. This calculation has not been validated in all clinical situations. eGFR's persistently <60 mL/min signify possible Chronic Kidney Disease.   Georgiann Hahn gap 04/14/2018 7  5 - 15 Final   Performed at [facility], [place] [address], [place], [place] [address]  Hospital Outpatient Visit on 04/05/2018  Component Date Value Ref Range Status  . aPTT 04/05/2018 28  24 - 36 seconds Final   Performed at [facility], [place] [address] [place], [place] [address]  . WBC 04/05/2018 4.4  4.0 - 10.5 K/uL Final  . RBC 04/05/2018 4.46  3.87  - 5.11 MIL/uL Final  . Hemoglobin 04/05/2018 12.9  12.0 - 15.0 g/dL Final  . HCT 04/05/2018 39.5  36.0 - 46.0 % Final  . MCV 04/05/2018 88.6  78.0 - 100.0 fL Final  . MCH 04/05/2018 28.9  26.0 - 34.0 pg Final  . MCHC 04/05/2018 32.7  30.0 - 36.0 g/dL Final  . RDW 04/05/2018 13.4  11.5 - 15.5 % Final  . Platelets 04/05/2018 312  150 - 400 K/uL Final  Performed at [facility], [place] [address], [place], [place] [address]  . Sodium 04/05/2018 142  135 - 145 mmol/L Final  . Potassium 04/05/2018 5.5* 3.5 - 5.1 mmol/L Final  . Chloride 04/05/2018 106  98 - 111 mmol/L Final   Please note change in reference range.  . CO2 04/05/2018 28  22 - 32 mmol/L Final  . Glucose, Bld 04/05/2018 96  70 - 99 mg/dL Final   Please note change in reference range.  . BUN 04/05/2018 29* 8 - 23 mg/dL Final   Please note change in reference range.  . Creatinine, Ser 04/05/2018 1.59* 0.44 - 1.00 mg/dL Final  . Calcium 04/05/2018 9.0  8.9 - 10.3 mg/dL Final  . Total Protein 04/05/2018 7.0  6.5 - 8.1 g/dL Final  . Albumin 04/05/2018 3.8  3.5 - 5.0 g/dL Final  . AST 04/05/2018 18  15 - 41 U/L Final  . ALT 04/05/2018 9  0 - 44 U/L Final   Please note change in reference range.  . Alkaline Phosphatase 04/05/2018 61  38 - 126 U/L Final  . Total Bilirubin 04/05/2018 0.2* 0.3 - 1.2 mg/dL Final  . GFR calc non Af Amer 04/05/2018 29* >60 mL/min Final  . GFR calc Af Amer 04/05/2018 34* >60 mL/min Final   Comment: (NOTE) The eGFR has been calculated using the CKD EPI equation. This calculation has not been validated in all clinical situations. eGFR's persistently <60 mL/min signify possible Chronic Kidney Disease.   Georgiann Hahn gap 04/05/2018 8  5 - 15 Final   Performed at [facility], [place] [address] [place], [place] [address]  . Prothrombin Time 04/05/2018 13.6  11.4 - 15.2 seconds Final  . INR 04/05/2018 1.05   Final   Performed at [facility], [place] [address] [place], [place] [address]  . ABO/RH(D) 04/05/2018 A POS   Final  . Antibody Screen 04/05/2018 NEG   Final  . Sample Expiration 04/05/2018 04/15/2018   Final  . Extend sample reason 04/05/2018    Final                   Value:NO TRANSFUSIONS OR PREGNANCY IN THE PAST 3 MONTHS Performed at [facility], [place] [address] [place], [place] [address]   . Color, Urine 04/05/2018 YELLOW  YELLOW Final  . APPearance 04/05/2018 CLEAR  CLEAR Final  . Specific Gravity, Urine 04/05/2018 1.019  1.005 - 1.030 Final  . pH 04/05/2018 5.0  5.0 - 8.0 Final  . Glucose, UA 04/05/2018 NEGATIVE  NEGATIVE mg/dL Final  . Hgb urine dipstick 04/05/2018 MODERATE* NEGATIVE Final  . Bilirubin Urine 04/05/2018 NEGATIVE  NEGATIVE Final  . Ketones, ur 04/05/2018 NEGATIVE  NEGATIVE mg/dL Final  . Protein, ur 04/05/2018 NEGATIVE  NEGATIVE mg/dL Final  . Nitrite 04/05/2018 NEGATIVE  NEGATIVE Final  . Leukocytes, UA 04/05/2018 TRACE* NEGATIVE Final  . RBC / HPF 04/05/2018 6-10  0 - 5 RBC/hpf Final  . WBC, UA 04/05/2018 0-5  0 - 5 WBC/hpf Final  . Bacteria, UA 04/05/2018 NONE SEEN  NONE SEEN Final  . Squamous Epithelial / LPF 04/05/2018 0-5  0 - 5 Final  . Mucus 04/05/2018 PRESENT   Final   Performed at [facility], [place] [address] [place], [place] [address]  . MRSA, PCR 04/05/2018 NEGATIVE  NEGATIVE Final  . Staphylococcus aureus 04/05/2018 NEGATIVE  NEGATIVE Final   Comment: (NOTE) The Xpert SA Assay (FDA approved  for NASAL specimens in patients 64 years of age and older), is one component of a comprehensive surveillance program. It is not intended to diagnose infection nor to guide or monitor treatment. Performed at [facility], [place] [address] [place], [place] [address]   Hospital Outpatient Visit on 03/23/2018  Component Date Value Ref Range Status  . Sodium 03/23/2018 142  135 - 145 mmol/L Final  . Potassium 03/23/2018 3.9  3.5 - 5.1 mmol/L Final  .  Chloride 03/23/2018 107  98 - 111 mmol/L Final   Please note change in reference range.  . CO2 03/23/2018 26  22 - 32 mmol/L Final  . Glucose, Bld 03/23/2018 101* 70 - 99 mg/dL Final   Please note change in reference range.  . BUN 03/23/2018 22  8 - 23 mg/dL Final   Please note change in reference range.  . Creatinine, Ser 03/23/2018 1.54* 0.44 - 1.00 mg/dL Final  . Calcium 03/23/2018 8.7* 8.9 - 10.3 mg/dL Final  . GFR calc non Af Amer 03/23/2018 30* >60 mL/min Final  . GFR calc Af Amer 03/23/2018 35* >60 mL/min Final   Comment: (NOTE) The eGFR has been calculated using the CKD EPI equation. This calculation has not been validated in all clinical situations. eGFR's persistently <60 mL/min signify possible Chronic Kidney Disease.   Georgiann Hahn gap 03/23/2018 9  5 - 15 Final   Performed at [facility], [place] [address] [place], [place] [address]  . WBC 03/23/2018 3.8* 4.0 - 10.5 K/uL Final  . RBC 03/23/2018 4.40  3.87 - 5.11 MIL/uL Final  . Hemoglobin 03/23/2018 13.0  12.0 - 15.0 g/dL Final  . HCT 03/23/2018 38.9  36.0 - 46.0 % Final  . MCV 03/23/2018 88.4  78.0 - 100.0 fL Final  . MCH 03/23/2018 29.5  26.0 - 34.0 pg Final  . MCHC 03/23/2018 33.4  30.0 - 36.0 g/dL Final  . RDW 03/23/2018 13.3  11.5 - 15.5 % Final  . Platelets 03/23/2018 273  150 - 400 K/uL Final  . Neutrophils Relative % 03/23/2018 57  % Final  . Neutro Abs 03/23/2018 2.2  1.7 - 7.7 K/uL Final  . Lymphocytes Relative 03/23/2018 32  % Final  . Lymphs Abs 03/23/2018 1.2  0.7 - 4.0 K/uL Final  . Monocytes Relative 03/23/2018 6  % Final  . Monocytes Absolute 03/23/2018 0.2  0.1 - 1.0 K/uL Final  . Eosinophils Relative 03/23/2018 4  % Final  . Eosinophils Absolute 03/23/2018 0.2  0.0 - 0.7 K/uL Final  . Basophils Relative 03/23/2018 1  % Final  . Basophils Absolute 03/23/2018 0.0  0.0 - 0.1 K/uL Final   Performed at [facility], [place] [address] [place], [place] [address]  .  Prothrombin Time 03/23/2018 13.2  11.4 - 15.2 seconds Final  . INR 03/23/2018 1.01   Final   Performed at [facility], [place] [address] [place], [place] [address]  Appointment on 03/04/2018  Component Date Value Ref Range Status  . Sodium 03/04/2018 138  136 - 145 mmol/L Final  . Potassium 03/04/2018 4.1  3.5 - 5.1 mmol/L Final  . Chloride 03/04/2018 104  98 - 109 mmol/L Final  . CO2 03/04/2018 27  22 - 29 mmol/L Final  . Glucose, Bld 03/04/2018 87  70 - 140 mg/dL Final  . BUN 03/04/2018 22  7 - 26 mg/dL Final  . Creatinine, Ser 03/04/2018 1.39* 0.60 - 1.10 mg/dL Final  .  Calcium 03/04/2018 8.6  8.4 - 10.4 mg/dL Final  . Total Protein 03/04/2018 6.5  6.4 - 8.3 g/dL Final  . Albumin 03/04/2018 3.5  3.5 - 5.0 g/dL Final  . AST 03/04/2018 15  5 - 34 U/L Final  . ALT 03/04/2018 <6  0 - 55 U/L Final  . Alkaline Phosphatase 03/04/2018 71  40 - 150 U/L Final  . Total Bilirubin 03/04/2018 0.3  0.2 - 1.2 mg/dL Final  . GFR calc non Af Amer 03/04/2018 35* >60 mL/min Final  . GFR calc Af Amer 03/04/2018 40* >60 mL/min Final   Comment: (NOTE) The eGFR has been calculated using the CKD EPI equation. This calculation has not been validated in all clinical situations. eGFR's persistently <60 mL/min signify possible Chronic Kidney Disease.   Georgiann Hahn gap 03/04/2018 7  3 - 11 Final   Performed at [facility], [place] [address] [place], [place] [address]  . WBC 03/04/2018 3.8* 3.9 - 10.3 K/uL Final  . RBC 03/04/2018 4.07  3.70 - 5.45 MIL/uL Final  . Hemoglobin 03/04/2018 12.1  11.6 - 15.9 g/dL Final  . HCT 03/04/2018 36.5  34.8 - 46.6 % Final  . MCV 03/04/2018 89.7  79.5 - 101.0 fL Final  . MCH 03/04/2018 29.7  25.1 - 34.0 pg Final  . MCHC 03/04/2018 33.2  31.5 - 36.0 g/dL Final  . RDW 03/04/2018 13.0  11.2 - 14.5 % Final  . Platelets 03/04/2018 231  145 - 400 K/uL Final  . Neutrophils Relative % 03/04/2018 55  % Final  . Neutro Abs 03/04/2018 2.1  1.5 -  6.5 K/uL Final  . Lymphocytes Relative 03/04/2018 33  % Final  . Lymphs Abs 03/04/2018 1.2  0.9 - 3.3 K/uL Final  . Monocytes Relative 03/04/2018 8  % Final  . Monocytes Absolute 03/04/2018 0.3  0.1 - 0.9 K/uL Final  . Eosinophils Relative 03/04/2018 3  % Final  . Eosinophils Absolute 03/04/2018 0.1  0.0 - 0.5 K/uL Final  . Basophils Relative 03/04/2018 1  % Final  . Basophils Absolute 03/04/2018 0.0  0.0 - 0.1 K/uL Final   Performed at [facility], [place] [address], [place], [place] [address]     X-Rays:Ir Removal Tun Access W/ [place] W/o [place] Mod Sed  Result Date: 03/23/2018 CLINICAL DATA:  History of endometrial carcinoma. Port placed 08/11/2016 by Dr. Annamaria Boots, has worked well without complication, no longer needed. Removal is requested. EXAM: EXAM TUNNELED PORT CATHETER REMOVAL TECHNIQUE: The procedure, risks (including but not limited to bleeding, infection, organ damage ), benefits, and alternatives were explained to the patient. Questions regarding the procedure were encouraged and answered. The patient understands and consents to the procedure. Intravenous Fentanyl and Versed were administered as conscious sedation during continuous monitoring of the patient's level of consciousness and physiological / cardiorespiratory status by the radiology RN, with a total moderate sedation time of 15 minutes. Overlying skin prepped with chlorhexidine, draped in usual sterile fashion, infiltrated locally with 1% lidocaine. A small incision was made over the scar from previous placement. The port catheter was dissected free from the underlying soft tissues and removed intact. Hemostasis was achieved. The port pocket was closed with deep interrupted and subcuticular continuous 3-0 Monocryl sutures, then covered with Dermabond. The patient tolerated the procedure well. COMPLICATIONS: COMPLICATIONS None immediate IMPRESSION: 1.  Technically successful tunneled Port catheter removal.  Electronically Signed   By: Lucrezia Europe M.D.   On: 03/23/2018 14:32  EKG: Orders placed or performed during the hospital encounter of 04/12/18  . EKG 12-Lead  . EKG 12-Lead     Hospital Course: Kayla Guerrero is a 83 y.o. who was admitted to [facility]. They were brought to the operating room on 04/12/2018 and underwent Procedure(s): LEFT TOTAL KNEE ARTHROPLASTY.  Patient tolerated the procedure well and was later transferred to the recovery room and then to the orthopaedic floor for postoperative care.  They were given PO and IV analgesics for pain control following their surgery.  They were given 24 hours of postoperative antibiotics of  Anti-infectives (From admission, onward)   Start     Dose/Rate Route Frequency Ordered Stop   04/12/18 1800  ceFAZolin (ANCEF) IVPB 2g/100 mL premix     2 g 200 mL/hr over 30 Minutes Intravenous Every 6 hours 04/12/18 1520 04/13/18 0021   04/12/18 0945  ceFAZolin (ANCEF) IVPB 2g/100 mL premix     2 g 200 mL/hr over 30 Minutes Intravenous On call to O.R. 04/12/18 0930 04/12/18 1153     and started on DVT prophylaxis in the form of Aspirin.   PT and OT were ordered for total joint protocol.  Discharge planning consulted to help with postop disposition and equipment needs.  Patient had a good night on the evening of surgery.  They started to get up OOB with therapy on day one. Hemovac drain was pulled without difficulty.  Continued to work with therapy into day two.  Dressing was changed on day two and the incision was clean and dry.  The patient had progressed with therapy and meeting their goals.  Incision was healing well.  Patient was seen in rounds and was ready to go home.   Diet: Cardiac diet Activity:WBAT Follow-up:in 2 weeks Disposition - Home Discharged Condition: stable   Discharge Instructions    Call MD / Call 911   Complete by:  As directed    If you experience chest pain or shortness of breath, CALL 911 and be transported to  the hospital emergency room.  If you develope a fever above 101 F, pus (white drainage) or increased drainage or redness at the wound, or calf pain, call your surgeon's office.   Constipation Prevention   Complete by:  As directed    Drink plenty of fluids.  Prune juice may be helpful.  You may use a stool softener, such as Colace (over the counter) 100 mg twice a day.  Use MiraLax (over the counter) for constipation as needed.   Diet - low sodium heart healthy   Complete by:  As directed    Discharge instructions   Complete by:  As directed    Dr. Gaynelle Arabian Total Joint Specialist [facility] [address] [place], [place] [address] [phone]  TOTAL KNEE REPLACEMENT POSTOPERATIVE DIRECTIONS  Knee Rehabilitation, Guidelines Following Surgery  Results after knee surgery are often greatly improved when you follow the exercise, range of motion and muscle strengthening exercises prescribed by your doctor. Safety measures are also important to protect the knee from further injury. Any time any of these exercises cause you to have increased pain or swelling in your knee joint, decrease the amount until you are comfortable again and slowly increase them. If you have problems or questions, call your caregiver or physical therapist for advice.   HOME CARE INSTRUCTIONS  Remove items at home which could result in a fall. This includes throw rugs or furniture in walking pathways.  ICE to the affected knee every three hours for 30 minutes at a time and then as needed for pain and swelling.  Continue to use ice on the knee for pain and swelling from surgery. You may notice swelling that will progress down to the foot and ankle.  This is normal after surgery.  Elevate the leg when you are not up walking on it.   Continue to use the breathing machine which will help keep your temperature down.  It is common for your temperature to cycle up and down following surgery, especially at night  when you are not up moving around and exerting yourself.  The breathing machine keeps your lungs expanded and your temperature down. Do not place pillow under knee, focus on keeping the knee straight while resting   DIET You may resume your previous home diet once your are discharged from the hospital.  DRESSING / WOUND CARE / SHOWERING You may change your dressing every day with sterile gauze.  Please use good hand washing techniques before changing the dressing.  Do not use any lotions or creams on the incision until instructed by your surgeon. You may start showering once you are discharged home but do not submerge the incision under water. Just pat the incision dry and apply a dry gauze dressing on daily. Change the surgical dressing daily and reapply a dry dressing each time.  ACTIVITY Walk with your walker as instructed. Use walker as long as suggested by your caregivers. Avoid periods of inactivity such as sitting longer than an hour when not asleep. This helps prevent blood clots.  You may resume a sexual relationship in one month or when given the OK by your doctor.  You may return to work once you are cleared by your doctor.  Do not drive a car for 6 weeks or until released by you surgeon.  Do not drive while taking narcotics.  WEIGHT BEARING Weight bearing as tolerated with assist device (walker, cane, etc) as directed, use it as long as suggested by your surgeon or therapist, typically at least 4-6 weeks.  POSTOPERATIVE CONSTIPATION PROTOCOL Constipation - defined medically as fewer than three stools per week and severe constipation as less than one stool per week.  One of the most common issues patients have following surgery is constipation.  Even if you have a regular bowel pattern at home, your normal regimen is likely to be disrupted due to multiple reasons following surgery.  Combination of anesthesia, postoperative narcotics, change in appetite and fluid intake all can  affect your bowels.  In order to avoid complications following surgery, here are some recommendations in order to help you during your recovery period.  Colace (docusate) - Pick up an over-the-counter form of Colace or another stool softener and take twice a day as long as you are requiring postoperative pain medications.  Take with a full glass of water daily.  If you experience loose stools or diarrhea, hold the colace until you stool forms back up.  If your symptoms do not get better within 1 week or if they get worse, check with your doctor.  Dulcolax (bisacodyl) - Pick up over-the-counter and take as directed by the product packaging as needed to assist with the movement of your bowels.  Take with a full glass of water.  Use this product as needed if not relieved by Colace only.   MiraLax (polyethylene glycol) - Pick up over-the-counter to have on hand.  MiraLax is a solution that  will increase the amount of water in your bowels to assist with bowel movements.  Take as directed and can mix with a glass of water, juice, soda, coffee, or tea.  Take if you go more than two days without a movement. Do not use MiraLax more than once per day. Call your doctor if you are still constipated or irregular after using this medication for 7 days in a row.  If you continue to have problems with postoperative constipation, please contact the office for further assistance and recommendations.  If you experience "the worst abdominal pain ever" or develop nausea or vomiting, please contact the office immediatly for further recommendations for treatment.  ITCHING  If you experience itching with your medications, try taking only a single pain pill, or even half a pain pill at a time.  You can also use Benadryl over the counter for itching or also to help with sleep.   TED HOSE STOCKINGS Wear the elastic stockings on both legs for three weeks following surgery during the day but you may remove then at night for  sleeping.  MEDICATIONS See your medication summary on the "After Visit Summary" that the nursing staff will review with you prior to discharge.  You may have some home medications which will be placed on hold until you complete the course of blood thinner medication.  It is important for you to complete the blood thinner medication as prescribed by your surgeon.  Continue your approved medications as instructed at time of discharge.  PRECAUTIONS If you experience chest pain or shortness of breath - call 911 immediately for transfer to the hospital emergency department.  If you develop a fever greater that 101 F, purulent drainage from wound, increased redness or drainage from wound, foul odor from the wound/dressing, or calf pain - CONTACT YOUR SURGEON.                                                   FOLLOW-UP APPOINTMENTS Make sure you keep all of your appointments after your operation with your surgeon and caregivers. You should call the office at the above phone number and make an appointment for approximately two weeks after the date of your surgery or on the date instructed by your surgeon outlined in the "After Visit Summary".   RANGE OF MOTION AND STRENGTHENING EXERCISES  Rehabilitation of the knee is important following a knee injury or an operation. After just a few days of immobilization, the muscles of the thigh which control the knee become weakened and shrink (atrophy). Knee exercises are designed to build up the tone and strength of the thigh muscles and to improve knee motion. Often times heat used for twenty to thirty minutes before working out will loosen up your tissues and help with improving the range of motion but do not use heat for the first two weeks following surgery. These exercises can be done on a training (exercise) mat, on the floor, on a table or on a bed. Use what ever works the best and is most comfortable for you Knee exercises include:  Leg Lifts - While your knee  is still immobilized in a splint or cast, you can do straight leg raises. Lift the leg to 60 degrees, hold for 3 sec, and slowly lower the leg. Repeat 10-20 times 2-3 times daily. Perform this  exercise against resistance later as your knee gets better.  Quad and Hamstring Sets - Tighten up the muscle on the front of the thigh (Quad) and hold for 5-10 sec. Repeat this 10-20 times hourly. Hamstring sets are done by pushing the foot backward against an object and holding for 5-10 sec. Repeat as with quad sets.  Leg Slides: Lying on your back, slowly slide your foot toward your buttocks, bending your knee up off the floor (only go as far as is comfortable). Then slowly slide your foot back down until your leg is flat on the floor again. Angel Wings: Lying on your back spread your legs to the side as far apart as you can without causing discomfort.  A rehabilitation program following serious knee injuries can speed recovery and prevent re-injury in the future due to weakened muscles. Contact your doctor or a physical therapist for more information on knee rehabilitation.   IF YOU ARE TRANSFERRED TO A SKILLED REHAB FACILITY If the patient is transferred to a skilled rehab facility following release from the hospital, a list of the current medications will be sent to the facility for the patient to continue.  When discharged from the skilled rehab facility, please have the facility set up the patient's [place] prior to being released. Also, the skilled facility will be responsible for providing the patient with their medications at time of release from the facility to include their pain medication, the muscle relaxants, and their blood thinner medication. If the patient is still at the rehab facility at time of the two week follow up appointment, the skilled rehab facility will also need to assist the patient in arranging follow up appointment in our office and any transportation needs.  MAKE  SURE YOU:  Understand these instructions.  Get help right away if you are not doing well or get worse.    Pick up stool softner and laxative for home use following surgery while on pain medications. Do not submerge incision under water. Please use good hand washing techniques while changing dressing each day. May shower starting three days after surgery. Please use a clean towel to pat the incision dry following showers. Continue to use ice for pain and swelling after surgery. Do not use any lotions or creams on the incision until instructed by your surgeon.   Increase activity slowly as tolerated   Complete by:  As directed      Allergies as of 04/14/2018      Reactions   Levofloxacin Other (See Comments)   Promethazine Hcl Other (See Comments)   fainting   Sulfa Antibiotics Swelling   Sulfa Drugs Cross Reactors Swelling   Sulfur Swelling      Medication List    STOP taking these medications   meloxicam 15 MG tablet Commonly known as:  MOBIC   terbinafine 250 MG tablet Commonly known as:  LAMISIL     TAKE these medications   aspirin 325 MG EC tablet Take 1 tablet (325 mg total) by mouth 2 (two) times daily.   cholecalciferol 1000 units tablet Commonly known as:  VITAMIN D Take 1,000 Units by mouth daily.   DULoxetine 30 MG capsule Commonly known as:  CYMBALTA Take 1 capsule (30 mg total) by mouth daily.   levothyroxine 88 MCG tablet Commonly known as:  SYNTHROID, LEVOTHROID TAKE ONE TABLET BY MOUTH ONCE DAILY BEFORE  BREAKFAST. What changed:    how much to take  how to take this  when  to take this  additional instructions   Magnesium 400 MG Caps Take 400 mg by mouth daily.   Melatonin 5 MG Caps Take 5 mg by mouth at bedtime.   methocarbamol 500 MG tablet Commonly known as:  ROBAXIN Take 1 tablet (500 mg total) by mouth every 6 (six) hours as needed for muscle spasms.   oxyCODONE 5 MG immediate release tablet Commonly known as:  Oxy  IR/ROXICODONE Take 1-2 tablets (5-10 mg total) by mouth every 6 (six) hours as needed for moderate pain (pain score 4-6).   polyethylene glycol packet Commonly known as:  MIRALAX / GLYCOLAX Take 17 g by mouth 2 (two) times daily as needed for mild constipation.   SYSTANE ULTRA 0.4-0.3 % Soln Generic drug:  Polyethyl Glycol-Propyl Glycol Place 1 drop into both eyes daily as needed (for dry eyes).   vitamin B-12 1000 MCG tablet Commonly known as:  CYANOCOBALAMIN Take 1,000 mcg by mouth daily.      Follow-up Information    Gaynelle Arabian, MD. Schedule an appointment as soon as possible for a visit on 04/27/2018.   Specialty:  Orthopedic Surgery Contact information: [address] [place] [address] [phone]           Signed: Ardeen Jourdain, PA-C Orthopaedic Surgery 04/14/2018, 9:29 PM

## 2018-04-15 ENCOUNTER — Ambulatory Visit: Payer: Medicare Other | Admitting: Podiatry

## 2018-04-29 ENCOUNTER — Encounter

## 2018-04-29 ENCOUNTER — Encounter: Payer: Self-pay | Admitting: Podiatry

## 2018-04-29 ENCOUNTER — Ambulatory Visit (INDEPENDENT_AMBULATORY_CARE_PROVIDER_SITE_OTHER): Payer: Medicare Other | Admitting: Podiatry

## 2018-04-29 DIAGNOSIS — L603 Nail dystrophy: Secondary | ICD-10-CM | POA: Diagnosis not present

## 2018-05-01 NOTE — Progress Notes (Signed)
She presents today after having taken Lamisil for quite some time states that they really do not seem to be a lot better there is a lot of white on them.  Objective: Vital signs are stable she is alert and oriented x3.  Pulses are palpable.  I think the nails themselves look much better than they did however she still has what appears to be remnants of the nail polish binder on the nail plate.  I debrided this today with a bur and she is much happier seeing that the nails look much clearer than they did prior to the Lamisil.  Assessment: Resolution of onychomycosis.  Plan: Follow-up with [place] on as-needed basis

## 2018-05-06 ENCOUNTER — Ambulatory Visit
Admission: RE | Admit: 2018-05-06 | Discharge: 2018-05-06 | Disposition: A | Discharge status: Home / Self Care | Payer: Medicare Other | Source: Ambulatory Visit | Attending: Radiation Oncology | Admitting: Radiation Oncology

## 2018-05-06 ENCOUNTER — Encounter: Payer: Self-pay | Admitting: Radiation Oncology

## 2018-05-06 ENCOUNTER — Other Ambulatory Visit: Payer: Self-pay

## 2018-05-06 VITALS — BP 155/69 | HR 91 | Temp 98.4°F | Resp 18 | Ht 66.0 in | Wt 191.8 lb

## 2018-05-06 DIAGNOSIS — Z7982 Long term (current) use of aspirin: Secondary | ICD-10-CM | POA: Insufficient documentation

## 2018-05-06 DIAGNOSIS — Z79899 Other long term (current) drug therapy: Secondary | ICD-10-CM | POA: Diagnosis not present

## 2018-05-06 DIAGNOSIS — Z882 Allergy status to sulfonamides status: Secondary | ICD-10-CM | POA: Diagnosis not present

## 2018-05-06 DIAGNOSIS — C541 Malignant neoplasm of endometrium: Secondary | ICD-10-CM

## 2018-05-06 LAB — CBC WITH DIFFERENTIAL (CANCER CENTER ONLY)
BASOS PCT: 1 %
Basophils Absolute: 0.1 10*3/uL (ref 0.0–0.1)
Eosinophils Absolute: 0.1 10*3/uL (ref 0.0–0.5)
Eosinophils Relative: 3 %
HCT: 33.3 % — ABNORMAL LOW (ref 34.8–46.6)
HEMOGLOBIN: 10.6 g/dL — AB (ref 11.6–15.9)
LYMPHS ABS: 1.2 10*3/uL (ref 0.9–3.3)
LYMPHS PCT: 24 %
MCH: 28.2 pg (ref 25.1–34.0)
MCHC: 31.8 g/dL (ref 31.5–36.0)
MCV: 88.6 fL (ref 79.5–101.0)
MONO ABS: 0.3 10*3/uL (ref 0.1–0.9)
MONOS PCT: 6 %
NEUTROS ABS: 3.5 10*3/uL (ref 1.5–6.5)
NEUTROS PCT: 66 %
Platelet Count: 460 10*3/uL — ABNORMAL HIGH (ref 145–400)
RBC: 3.76 MIL/uL (ref 3.70–5.45)
RDW: 14.6 % — AB (ref 11.2–14.5)
WBC Count: 5.2 10*3/uL (ref 3.9–10.3)

## 2018-05-06 LAB — CMP (CANCER CENTER ONLY)
ALBUMIN: 3.3 g/dL — AB (ref 3.5–5.0)
ALK PHOS: 99 U/L (ref 38–126)
ALT: <6 U/L (ref 0–44)
ANION GAP: 10 (ref 5–15)
AST: 16 U/L (ref 15–41)
BUN: 17 mg/dL (ref 8–23)
CALCIUM: 8.7 mg/dL — AB (ref 8.9–10.3)
CHLORIDE: 104 mmol/L (ref 98–111)
CO2: 27 mmol/L (ref 22–32)
Creatinine: 1.35 mg/dL — ABNORMAL HIGH (ref 0.44–1.00)
GFR, Est AFR Am: 41 mL/min — ABNORMAL LOW (ref >60)
GFR, Estimated: 36 mL/min — ABNORMAL LOW (ref >60)
GLUCOSE: 90 mg/dL (ref 70–99)
Potassium: 4.2 mmol/L (ref 3.5–5.1)
SODIUM: 141 mmol/L (ref 135–145)
Total Bilirubin: 0.3 mg/dL (ref 0.3–1.2)
Total Protein: 6.9 g/dL (ref 6.5–8.1)

## 2018-05-06 NOTE — Progress Notes (Signed)
Pt presents to clinic for f/u with Dr. Sondra Come. Pt is accompanied by husband. Pt reports severe fatigue, most likely associated with recent general anesthesia from knee replacement. Pain in only in left knee, which has recently had joint replacement surgery. Pt denies dysuria/hematuria. Pt denies vaginal discharge/bleeding. Pt denies rectal bleeding. Pt reports intermittent diarrhea and constipation. Pt has nausea with eating too fast. No abdominal bloating. Pt is not using vaginal dilator. Pt reports "not much" intercourse.   BP (!) 155/69 (BP Location: Left Arm, Patient Position: Sitting)   Pulse 91   Temp 98.4 F (36.9 C) (Oral)   Resp 18   Ht 5\' 6"  (1.676 m)   Wt 191 lb 12.8 oz (87 kg)   SpO2 95%   BMI 30.96 kg/m   Wt Readings from Last 3 Encounters:  05/06/18 191 lb 12.8 oz (87 kg)  04/12/18 194 lb (88 kg)  04/05/18 194 lb (88 kg)   Loma Sousa, RN BSN

## 2018-05-06 NOTE — Progress Notes (Signed)
Radiation Oncology         [phone] ________________________________  Name: Kayla Guerrero MRN: [id number]  Date: 05/06/2018  DOB: [birth year]  Follow-Up Visit Note  CC: Rita Ohara, MD  [name], MD    ICD-10-CM   1. Malignant neoplasm of endometrium [facility]) C54.1 CBC with Differential ([place])    [place] ([place] only)    Diagnosis:   83 y.o. 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 She notes having recent knee replacement. She denies using her vaginal dilator. She denies vaginal bleeding, abdominal or back pain. She reports occasional stomach pain and bloating, and fatigue. Her husband reports that she is weak, which she attributes to her recent knee surgery.   ALLERGIES:  is allergic to levofloxacin; promethazine hcl; sulfa antibiotics; sulfa drugs cross reactors; and sulfur.  Meds: Current Outpatient Medications  Medication Sig Dispense Refill  . aspirin EC 325 MG EC tablet Take 1 tablet (325 mg total) by mouth 2 (two) times daily. 40 tablet 0  . cholecalciferol (VITAMIN D) 1000 units tablet Take 1,000 Units by mouth daily.     . DULoxetine (CYMBALTA) 30 MG capsule Take 1 capsule (30 mg total) by mouth daily. 90 capsule 3  . levothyroxine (SYNTHROID, LEVOTHROID) 88 MCG tablet TAKE ONE TABLET BY MOUTH ONCE DAILY BEFORE  BREAKFAST. (Patient taking differently: Take 88 mcg by mouth daily before breakfast. ) 90 tablet 3  . Magnesium 400 MG CAPS Take 400 mg by mouth daily.     . Melatonin 5 MG CAPS Take 5 mg by mouth at bedtime.     . methocarbamol (ROBAXIN) 500 MG tablet Take 1 tablet (500 mg total) by mouth every 6 (six) hours as needed for muscle spasms. 40 tablet 0  . oxyCODONE (OXY IR/ROXICODONE) 5 MG immediate release  tablet Take 1-2 tablets (5-10 mg total) by mouth every 6 (six) hours as needed for moderate pain (pain score 4-6). 56 tablet 0  . Polyethyl Glycol-Propyl Glycol (SYSTANE ULTRA) 0.4-0.3 % SOLN Place 1 drop into both eyes daily as needed (for dry eyes).    . polyethylene glycol (MIRALAX / GLYCOLAX) packet Take 17 g by mouth 2 (two) times daily as needed for mild constipation.     . vitamin B-12 (CYANOCOBALAMIN) 1000 MCG tablet Take 1,000 mcg by mouth daily.     No current facility-administered medications for this encounter.    Facility-Administered Medications Ordered in Other Encounters  Medication Dose Route Frequency Provider Last Rate Last Dose  . influenza  inactive virus vaccine (FLUZONE/FLUARIX) injection 0.5 mL  0.5 mL Intramuscular Once Rita Ohara, MD       Review of Systems: REVIEW OF SYSTEMS: A 10+ POINT REVIEW OF SYSTEMS WAS OBTAINED including neurology, dermatology, psychiatry, cardiac, respiratory, lymph, extremities, GI, GU, musculoskeletal, constitutional, reproductive, HEENT. All pertinent positives are noted in the HPI. All others are negative.  Physical Findings: The patient is in no acute distress. Patient is alert and oriented.  height is 5\' 6"  (1.676 m) and weight is 191 lb 12.8 oz (87 kg). Her oral temperature is 98.4 F (36.9 C). Her blood pressure is 155/69 (abnormal) and her pulse is 91. Her respiration is 18 and oxygen saturation is 95%.   Lungs are clear to auscultation  bilaterally. Heart has regular rate and rhythm. No palpable cervical, supraclavicular, or axillary adenopathy. Abdomen soft, non-tender, normal bowel sounds. On pelvic examination the external genitalia were unremarkable. A speculum exam was performed. There are no mucosal lesions noted in the vaginal vault. On bimanual and rectovaginal examination there were no pelvic masses appreciated.  Lab Findings: Lab Results  Component Value Date   WBC 5.2 05/06/2018   HGB 10.6 (L) 05/06/2018   HCT 33.3 (L)  05/06/2018   MCV 88.6 05/06/2018   PLT 460 (H) 05/06/2018    Radiographic Findings: No results found.  Impression:  83 y.o. 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  No evidence of disease recurrence on clinical exam today. Patient is having severe fatigue, especially since her knee surgery. We will send her today for blood work, to see if there is any cause for her severe fatigue. I recommended she follow-up with her primary care physician if this is not improved in the next few weeks.  Plan:  Follow up in radiation oncology in 6 months. She missed her follow up with Dr. Alycia Rossetti in the spring. She will follow up with gynecological oncology in 3 months (Dr. Gerarda Fraction).  ____________________________________  Blair Promise, PhD, MD  This document serves as a record of services personally performed by Gery Pray, MD. It was created on his behalf by Wilburn Mylar, a trained medical scribe. The creation of this record is based on the scribe's personal observations and the provider's statements to them. This document has been checked and approved by the attending provider.

## 2018-05-10 ENCOUNTER — Encounter: Payer: Self-pay | Admitting: Family Medicine

## 2018-05-10 ENCOUNTER — Ambulatory Visit (INDEPENDENT_AMBULATORY_CARE_PROVIDER_SITE_OTHER): Payer: Medicare Other | Admitting: Family Medicine

## 2018-05-10 VITALS — BP 134/84 | HR 84 | Ht 66.5 in | Wt 189.4 lb

## 2018-05-10 DIAGNOSIS — E039 Hypothyroidism, unspecified: Secondary | ICD-10-CM | POA: Diagnosis not present

## 2018-05-10 DIAGNOSIS — R5383 Other fatigue: Secondary | ICD-10-CM

## 2018-05-10 NOTE — Progress Notes (Signed)
Chief Complaint  Patient presents with  . Fatigue    excessive fatigue. Just gets more tired and more sleepy as time goes on.    She presents with complaint of fatigue. She thinks it all started when she was started on the duloxetine (09/2016, went off it in 01/2017, had recurrent depression, restarted in late July). She takes it mid-day. She doesn't want to be on this anymore. She reports it never really helped with her neuropathy; moods have improved. She had left knee replacement about 4 weeks ago, and is progressing well. Taking only tylenol for pain. No longer on opioids or muscle relaxants. She reports feeling tired all the time She goes to bed at 9, can sleep for 12 hours (sometimes might be up for an hour in the night); can easily fall asleep during the day. Husband denies any snoring or apnea  Hypothyroidism--takes medication on empty stomach, separate from food/other medications, same as in the past, no changes. Lab Results  Component Value Date   TSH 3.15 06/11/2017  Denies change in generic supplier as far as she is aware, (not mentioned by pharmacy, and pills look the same).  Had labs done 8/8 as part of routine check with Dr. Sondra Come (rad onc). She reported fatigue, and CBC and c-met was done  Lab Results  Component Value Date   WBC 5.2 05/06/2018   HGB 10.6 (L) 05/06/2018   HCT 33.3 (L) 05/06/2018   MCV 88.6 05/06/2018   PLT 460 (H) 05/06/2018     Chemistry      Component Value Date/Time   NA 141 05/06/2018 1215   NA 142 03/16/2017 0852   K 4.2 05/06/2018 1215   K 4.2 03/16/2017 0852   CL 104 05/06/2018 1215   CO2 27 05/06/2018 1215   CO2 28 03/16/2017 0852   BUN 17 05/06/2018 1215   BUN 9.9 03/16/2017 0852   CREATININE 1.35 (H) 05/06/2018 1215   CREATININE 0.86 06/25/2017 1119   CREATININE 0.8 03/16/2017 0852      Component Value Date/Time   CALCIUM 8.7 (L) 05/06/2018 1215   CALCIUM 9.2 03/16/2017 0852   ALKPHOS 99 05/06/2018 1215   ALKPHOS 64 03/16/2017  0852   AST 16 05/06/2018 1215   AST 19 03/16/2017 0852   ALT <6 05/06/2018 1215   ALT 9 03/16/2017 0852   BILITOT 0.3 05/06/2018 1215   BILITOT 0.27 03/16/2017 0852     Last vitamin D level was 05/2017, normal at 49.  She is taking same supplements.    PMH, PSH, SH reviewed  Outpatient Encounter Medications as of 05/10/2018  Medication Sig Note  . acetaminophen (TYLENOL) 500 MG tablet Take 1,000 mg by mouth every 8 (eight) hours as needed.   [name] aspirin EC 81 MG tablet Take 81 mg by mouth daily.   . cholecalciferol (VITAMIN D) 1000 units tablet Take 1,000 Units by mouth daily.  05/10/2018: Thinks dose may be 2000 IU  . DULoxetine (CYMBALTA) 30 MG capsule Take 1 capsule (30 mg total) by mouth daily.   [name] levothyroxine (SYNTHROID, LEVOTHROID) 88 MCG tablet TAKE ONE TABLET BY MOUTH ONCE DAILY BEFORE  BREAKFAST. (Patient taking differently: Take 88 mcg by mouth daily before breakfast. )   . Melatonin 5 MG CAPS Take 5 mg by mouth at bedtime.    Vladimir Faster Glycol-Propyl Glycol (SYSTANE ULTRA) 0.4-0.3 % SOLN Place 1 drop into both eyes daily as needed (for dry eyes).   . polyethylene glycol (MIRALAX / GLYCOLAX) packet Take  17 g by mouth 2 (two) times daily as needed for mild constipation.    . vitamin B-12 (CYANOCOBALAMIN) 1000 MCG tablet Take 1,000 mcg by mouth daily.   . Magnesium 400 MG CAPS Take 400 mg by mouth daily.  05/10/2018: Didn't restart after surgery  . [DISCONTINUED] aspirin EC 325 MG EC tablet Take 1 tablet (325 mg total) by mouth 2 (two) times daily.   . [DISCONTINUED] methocarbamol (ROBAXIN) 500 MG tablet Take 1 tablet (500 mg total) by mouth every 6 (six) hours as needed for muscle spasms.   . [DISCONTINUED] oxyCODONE (OXY IR/ROXICODONE) 5 MG immediate release tablet Take 1-2 tablets (5-10 mg total) by mouth every 6 (six) hours as needed for moderate pain (pain score 4-6).    Facility-Administered Encounter Medications as of 05/10/2018  Medication  . influenza  inactive virus  vaccine (FLUZONE/FLUARIX) injection 0.5 mL   Allergies  Allergen Reactions  . Levofloxacin Other (See Comments)  . Promethazine Hcl Other (See Comments)    fainting  . Sulfa Antibiotics Swelling  . Sulfa Drugs Cross Reactors Swelling  . Sulfur Swelling   ROS: no fever, chills, headaches, dizziness, chest pain.  +fatigue per HPI. No depression. No changes in bowels, hair, skin. Denies allergy symptoms, poor sleep. See HPI.  PHYSICAL EXAM:  BP 134/84   Pulse 84   Ht 5' 6.5" (1.689 m)   Wt 189 lb 6.4 oz (85.9 kg)   BMI 30.11 kg/m   Wt Readings from Last 3 Encounters:  05/10/18 189 lb 6.4 oz (85.9 kg)  05/06/18 191 lb 12.8 oz (87 kg)  04/12/18 194 lb (88 kg)   Well appearing, though somewhat tired, in no distress, accompanied by her husband HEENT: conjunctiva and sclera are clear, anicteric. OP is clear, moist mucus membranes Neck: no lymphadenopathy, thyromegaly or mass Heart: regular rate and rhythm, no murmur Lungs: clear bilaterally Abdomen: soft, nontender, no mass Extremities: no edema Skin: normal turgor, slightly pale.  Psych: normal mood, affect, hygiene and grooming Neuro: alert and oriented, cranial nerves intact, normal gait  ASSESSMENT/PLAN:  Fatigue, unspecified type - she suspects SE to cymbalta, wants to taper off; to contact us if any recurrent depressive sx. Advised to limit sleep to 9 hours/day - Plan: TSH  Hypothyroidism, unspecified type - recheck TSH - Plan: TSH  She does have a mild anemia, related to recent TKR, and it has improved slightly per recent labs. Plan to recheck at next visit (scheduled for October)  Can taper off cymbalta Didn't help neuropathy, did help depression If recurrent depression, consider SSRI, less sedating med   TSH today (rather than waiting until her physical in October)  25-30 min visit, more than 1/2 spent counseling (re: s/sx recurrent depression, ddx of fatigue, risks of getting too much sleep, how to taper  duloxetine, etc).

## 2018-05-10 NOTE — Patient Instructions (Addendum)
We are checking your thyroid today to see if the dose needs to be adjusted. We will be in contact with your results to let you know if dose adjustment is needed.  Since you feel that duloxetine has been contributing to fatigue since it was started, you can go ahead and taper off (every other day for a week, then every third, then stop). Be on the lookout for any recurrent depressive symptoms, and we can start a different (hopefully less sedating) medication if this is the case.  Try and limit sleep to no more than 9 hours/night. Continue to try and get regular exercise.

## 2018-05-11 LAB — TSH: TSH: 3.21 u[IU]/mL (ref 0.450–4.500)

## 2018-06-10 ENCOUNTER — Other Ambulatory Visit: Payer: Self-pay | Admitting: *Deleted

## 2018-06-10 ENCOUNTER — Ambulatory Visit (INDEPENDENT_AMBULATORY_CARE_PROVIDER_SITE_OTHER): Payer: Medicare Other | Admitting: Family Medicine

## 2018-06-10 ENCOUNTER — Ambulatory Visit
Admission: RE | Admit: 2018-06-10 | Discharge: 2018-06-10 | Disposition: A | Discharge status: Home / Self Care | Payer: Medicare Other | Source: Ambulatory Visit | Attending: Family Medicine | Admitting: Family Medicine

## 2018-06-10 ENCOUNTER — Encounter: Payer: Self-pay | Admitting: Family Medicine

## 2018-06-10 VITALS — BP 116/70 | HR 92 | Ht 66.5 in | Wt 188.4 lb

## 2018-06-10 DIAGNOSIS — R5383 Other fatigue: Secondary | ICD-10-CM | POA: Diagnosis not present

## 2018-06-10 DIAGNOSIS — R531 Weakness: Secondary | ICD-10-CM

## 2018-06-10 DIAGNOSIS — R350 Frequency of micturition: Secondary | ICD-10-CM

## 2018-06-10 DIAGNOSIS — R911 Solitary pulmonary nodule: Secondary | ICD-10-CM

## 2018-06-10 DIAGNOSIS — R1013 Epigastric pain: Secondary | ICD-10-CM

## 2018-06-10 DIAGNOSIS — J948 Other specified pleural conditions: Secondary | ICD-10-CM

## 2018-06-10 DIAGNOSIS — K219 Gastro-esophageal reflux disease without esophagitis: Secondary | ICD-10-CM

## 2018-06-10 DIAGNOSIS — R918 Other nonspecific abnormal finding of lung field: Secondary | ICD-10-CM

## 2018-06-10 DIAGNOSIS — D649 Anemia, unspecified: Secondary | ICD-10-CM

## 2018-06-10 LAB — POCT URINALYSIS DIP (PROADVANTAGE DEVICE)
BILIRUBIN UA: NEGATIVE mg/dL
Bilirubin, UA: NEGATIVE
Blood, UA: NEGATIVE
GLUCOSE UA: NEGATIVE mg/dL
LEUKOCYTES UA: NEGATIVE
Nitrite, UA: NEGATIVE
SPECIFIC GRAVITY, URINE: 1.03
Urobilinogen, Ur: NEGATIVE
pH, UA: 6 (ref 5.0–8.0)

## 2018-06-10 MED ORDER — OMEPRAZOLE 40 MG PO CPDR: 40.0000 mg | DELAYED_RELEASE_CAPSULE | Freq: Every day | ORAL | 1 refills | Status: DC

## 2018-06-10 NOTE — Patient Instructions (Addendum)
Cut back on caffeine, nothing after 2pm. Cut back on soda and carbonated beverages. Continue to limit chocolate, citrus, caffeine, tomatoes and spicy foods. Continue to eat small meals frequently, rather than larger meals. Dry and get your 6-8 glasses of noncaffeinated, nonalcoholic beverages earlier in the day, limiting how much you drink after 6-7 pm.  If you can get up to void less frequently at night, maybe you will be less tired during the day. Consider mentioning these urinary symptoms to Dr. Diona Fanti.  Take the 40mg  omeprazole (prescription) in place of the OTC prilosec.  Go to [facility] to get your chest x-ray.

## 2018-06-10 NOTE — Progress Notes (Signed)
Chief Complaint  Patient presents with  . Fatigue    and acid reflux that are worsening.     She has been off duloxetine for about 3 weeks. We discussed tapering off medication at her visit last month, due to complaints of fatigue (which she thought may have been related to the medication).  She notices the neuropathy (tingling, occasional pain) just a little more since it was stopped.  Energy has not improved.  Moods haven't worsened.  She reports we discussed reflux symptoms at the end of her last visit (was not documented).  She has been having a lot of belching, reflux, even vomited twice.  Vomiting occurred during meals/while eating (once was a snack) both times. Has vomited a few other times, but not as recently.  She took 2 weeks of Prilosec OTC once daily. She felt better while she was taking it.  Symptoms recurred after she stopped the 2 week course, restarted it a week later (about a week ago), feels a little better, still belching.  Denies change in her diet--since moving, she no longer cooks, eats at [employer].  Some decreased appetite recently. She drinks 1-2 cups of coffee daily, Diet Coke 1/day or less (used to drink more).  Limits tomatoes (occasional pizza), little citrus, cut back on chocolate. Meal size hasn't changed, not eating large meals.  Queasiness/nausea only on those few occasions where she has vomited, not other times throughout the day, no nausea, in the morning or at night. Belching after meals is more frequent (not every meal).  She takes Miralax most days of the week and bowels are normal on this regimen. She denies abdominal pain.  She feels fine at rest, as she does now while seated and taking history in the exam room. Gets fatigued easily with any exertion, even just bathing or brushing teeth.  Takes a long time to get dressed, needs to stop and rest.  She reads off a "list of weird symptoms": -Wakes up unrefreshed.  No snoring per husband. Up 4-5 times at  night to void, but denies this being different for her, unchanged (and denies any dysuria, urgency, or significant daytime frequency, goes about every 2 hours during the day). -Skin sensitivity, itchy at upper thighs, various places, comes and goes -Joint and body pains, especially in her thighs -Gets hot and chilled off/on. -Right shoulder/upper arm pain, hurts to reach her arm back.  No edema. No chest pain. Slight dry cough. No allergies. No fevers.  Due to f/u with urologist, just got letter.  Due for f/u on cyst on kidney ([place])  PMH, PSH, SH reviewed  Outpatient Encounter Medications as of 06/10/2018  Medication Sig Note  . cholecalciferol (VITAMIN D) 1000 units tablet Take 1,000 Units by mouth daily.  05/10/2018: Thinks dose may be 2000 IU  . glucosamine-chondroitin 500-400 MG tablet Take 1 tablet by mouth 3 (three) times daily.   [name] levothyroxine (SYNTHROID, LEVOTHROID) 88 MCG tablet TAKE ONE TABLET BY MOUTH ONCE DAILY BEFORE  BREAKFAST. (Patient taking differently: Take 88 mcg by mouth daily before breakfast. )   . Melatonin 5 MG CAPS Take 5 mg by mouth at bedtime.    . Multiple Vitamins-Minerals (MULTIVITAMIN WITH MINERALS) tablet Take 1 tablet by mouth daily.   Vladimir Faster Glycol-Propyl Glycol (SYSTANE ULTRA) 0.4-0.3 % SOLN Place 1 drop into both eyes daily as needed (for dry eyes).   . polyethylene glycol (MIRALAX / GLYCOLAX) packet Take 17 g by mouth 2 (two) times daily as needed  for mild constipation.    [name] omeprazole (PRILOSEC) 40 MG capsule Take 1 capsule (40 mg total) by mouth daily.   . vitamin B-12 (CYANOCOBALAMIN) 1000 MCG tablet Take 1,000 mcg by mouth daily.   . [DISCONTINUED] acetaminophen (TYLENOL) 500 MG tablet Take 1,000 mg by mouth every 8 (eight) hours as needed.   . [DISCONTINUED] aspirin EC 81 MG tablet Take 81 mg by mouth daily.   . [DISCONTINUED] DULoxetine (CYMBALTA) 30 MG capsule Take 1 capsule (30 mg total) by mouth daily.   . [DISCONTINUED] Magnesium  400 MG CAPS Take 400 mg by mouth daily.  05/10/2018: Didn't restart after surgery   Facility-Administered Encounter Medications as of 06/10/2018  Medication  . influenza  inactive virus vaccine (FLUZONE/FLUARIX) injection 0.5 mL   Allergies  Allergen Reactions  . Levofloxacin Other (See Comments)  . Promethazine Hcl Other (See Comments)    fainting  . Sulfa Antibiotics Swelling  . Sulfa Drugs Cross Reactors Swelling  . Sulfur Swelling   ROS: no fever, chills.  +fatigue, shortness of breath, belching, vomiting. No diarrhea, constipation is controlled with miralax. No dizziness/syncope. No chest pain.  No bleeding, bruising, rash.  Nocturia, but no other urinary complaints. Some joint pains (right shoulder), recovering well from recent knee surgery.  No tachycardia. +indigestion, nausea, reflux, vomiting per HPI; tums and prilosec help.   PHYSICAL EXAM:  BP 116/70   Pulse 92   Ht 5' 6.5" (1.689 m)   Wt 188 lb 6.4 oz (85.5 kg)   BMI 29.95 kg/m    Wt Readings from Last 3 Encounters:  06/10/18 188 lb 6.4 oz (85.5 kg)  05/10/18 189 lb 6.4 oz (85.9 kg)  05/06/18 191 lb 12.8 oz (87 kg)    Pale appearing elderly female, appears tired, but in no acute distress. HEENT: PERRL, sclera anicteric. OP clear Neck: no lymphadenopathy, thyromegaly or mass Heart: regular rate and rhythm (faster than her usual baseline pulse, not tachycardic) Lungs: clear bilaterally, no wheezes, rales, ronchi Abdomen: soft, normal bowel sounds. +Epigastric tenderness. No rebound, guarding, mass. nontender in RUQ Extremities: no edema Neuro: alert and oriented, cranial nerves intact, normal gait Psych: normal mood, affect, hygiene, grooming, somewhat frustrated at feeling so poorly/fatigued/limited.  Denies depression/anxiety.  EKG--no acute findings, unchanged from pre-op EKG in July.   ASSESSMENT/PLAN:  Fatigue, unspecified type - appears pale; anemia (related to surgery) had been improving, recheck.  Concern for poss cardiac, cardiomyopathy. CXR, consider echo - Plan: CBC with Differential/Platelet, Comprehensive metabolic panel, DG Chest 2 View, EKG 12-Lead  Generalized weakness - and easy fatigability.  recent TSH was normal. recheck CBC, CXR. Consider echo, sleep study - Plan: CBC with Differential/Platelet, Comprehensive metabolic panel, DG Chest 2 View, EKG 12-Lead  Epigastric pain - increase omeprazole dose, take daily x 1 month. Consider breath testing if not resolving - Plan: CBC with Differential/Platelet, Comprehensive metabolic panel, omeprazole (PRILOSEC) 40 MG capsule  Gastroesophageal reflux disease, esophagitis presence not specified - diet/behavioral measures reviewed in detail. Increase PPI dose. Consider breath test - Plan: omeprazole (PRILOSEC) 40 MG capsule  Urinary frequency - check urine to r/o infection. Limit fluids/caffeine to not interfere with sleep, which may contribute to fatigue - Plan: POCT Urinalysis DIP (Proadvantage Device)  Anemia, unspecified type - related to knee surgery; recheck, given worsening fatigue - Plan: CBC with Differential/Platelet, Ferritin, Iron, Iron  Discussed reasons for checking CXR. No pulmonary symptoms. Would like to ensure no cardiomegaly given her symptoms. May need echo.  (would want to see  Dr.Skains, if needs to see cards, where her husband goes) Former smoker, x 20 years, quit 40 years ago.  CBC, ferritin, iron c-met Urine dip today  (no sx other than nocturia). Consider breath test   Addendum:  CXR:  IMPRESSION: New large with pleural based mass in the right lower lobe and nodules in both the right and left lung are worrisome for metastatic disease in this patient with a history of endometrial carcinoma. Recommend CT chest, abdomen and pelvis with contrast for further Evaluation.  CT's ordered for next week. Pt advised of results.

## 2018-06-11 ENCOUNTER — Other Ambulatory Visit: Payer: Self-pay | Admitting: Family Medicine

## 2018-06-11 ENCOUNTER — Ambulatory Visit
Admission: RE | Admit: 2018-06-11 | Discharge: 2018-06-11 | Disposition: A | Discharge status: Home / Self Care | Payer: Medicare Other | Source: Ambulatory Visit | Attending: Family Medicine | Admitting: Family Medicine

## 2018-06-11 DIAGNOSIS — J948 Other specified pleural conditions: Secondary | ICD-10-CM

## 2018-06-11 DIAGNOSIS — R911 Solitary pulmonary nodule: Secondary | ICD-10-CM

## 2018-06-11 DIAGNOSIS — R918 Other nonspecific abnormal finding of lung field: Secondary | ICD-10-CM

## 2018-06-11 LAB — CBC WITH DIFFERENTIAL/PLATELET
BASOS: 1 %
Basophils Absolute: 0.1 10*3/uL (ref 0.0–0.2)
EOS (ABSOLUTE): 0.2 10*3/uL (ref 0.0–0.4)
EOS: 3 %
HEMOGLOBIN: 11 g/dL — AB (ref 11.1–15.9)
Hematocrit: 35 % (ref 34.0–46.6)
IMMATURE GRANS (ABS): 0 10*3/uL (ref 0.0–0.1)
IMMATURE GRANULOCYTES: 0 %
LYMPHS: 21 %
Lymphocytes Absolute: 1.2 10*3/uL (ref 0.7–3.1)
MCH: 27.4 pg (ref 26.6–33.0)
MCHC: 31.4 g/dL — AB (ref 31.5–35.7)
MCV: 87 fL (ref 79–97)
MONOCYTES: 7 %
Monocytes Absolute: 0.4 10*3/uL (ref 0.1–0.9)
NEUTROS PCT: 68 %
Neutrophils Absolute: 3.8 10*3/uL (ref 1.4–7.0)
PLATELETS: 406 10*3/uL (ref 150–450)
RBC: 4.02 x10E6/uL (ref 3.77–5.28)
RDW: 14.9 % (ref 12.3–15.4)
WBC: 5.6 10*3/uL (ref 3.4–10.8)

## 2018-06-11 LAB — COMPREHENSIVE METABOLIC PANEL
ALBUMIN: 4.4 g/dL (ref 3.5–4.7)
ALK PHOS: 75 IU/L (ref 39–117)
ALT: 7 IU/L (ref 0–32)
AST: 18 IU/L (ref 0–40)
Albumin/Globulin Ratio: 1.9 (ref 1.2–2.2)
BUN / CREAT RATIO: 14 (ref 12–28)
BUN: 20 mg/dL (ref 8–27)
CHLORIDE: 102 mmol/L (ref 96–106)
CO2: 22 mmol/L (ref 20–29)
CREATININE: 1.42 mg/dL — AB (ref 0.57–1.00)
Calcium: 9.8 mg/dL (ref 8.7–10.3)
GFR calc Af Amer: 40 mL/min/{1.73_m2} — ABNORMAL LOW (ref >59)
GFR calc non Af Amer: 35 mL/min/{1.73_m2} — ABNORMAL LOW (ref >59)
GLUCOSE: 92 mg/dL (ref 65–99)
Globulin, Total: 2.3 g/dL (ref 1.5–4.5)
Potassium: 5 mmol/L (ref 3.5–5.2)
Sodium: 142 mmol/L (ref 134–144)
Total Protein: 6.7 g/dL (ref 6.0–8.5)

## 2018-06-11 LAB — FERRITIN: Ferritin: 59 ng/mL (ref 15–150)

## 2018-06-11 LAB — IRON: IRON: 35 ug/dL (ref 27–139)

## 2018-06-11 MED ORDER — IOPAMIDOL (ISOVUE-300) INJECTION 61%: 100.0000 mL | Freq: Once | INTRAVENOUS | Status: DC | PRN

## 2018-06-14 ENCOUNTER — Telehealth: Payer: Self-pay | Admitting: Hematology and Oncology

## 2018-06-14 ENCOUNTER — Encounter: Payer: Self-pay | Admitting: Hematology and Oncology

## 2018-06-14 ENCOUNTER — Inpatient Hospital Stay: Payer: Medicare Other | Attending: Hematology and Oncology | Admitting: Hematology and Oncology

## 2018-06-14 ENCOUNTER — Other Ambulatory Visit (HOSPITAL_COMMUNITY)
Admission: RE | Admit: 2018-06-14 | Discharge: 2018-06-14 | Disposition: A | Discharge status: Home / Self Care | Payer: Medicare Other | Source: Ambulatory Visit | Attending: Hematology and Oncology | Admitting: Hematology and Oncology

## 2018-06-14 ENCOUNTER — Inpatient Hospital Stay: Payer: Medicare Other

## 2018-06-14 VITALS — BP 163/77 | HR 81 | Temp 97.8°F | Resp 18 | Ht 66.5 in | Wt 186.3 lb

## 2018-06-14 DIAGNOSIS — Z5111 Encounter for antineoplastic chemotherapy: Secondary | ICD-10-CM | POA: Diagnosis present

## 2018-06-14 DIAGNOSIS — Z23 Encounter for immunization: Secondary | ICD-10-CM | POA: Insufficient documentation

## 2018-06-14 DIAGNOSIS — N1330 Unspecified hydronephrosis: Secondary | ICD-10-CM | POA: Insufficient documentation

## 2018-06-14 DIAGNOSIS — C786 Secondary malignant neoplasm of retroperitoneum and peritoneum: Secondary | ICD-10-CM | POA: Insufficient documentation

## 2018-06-14 DIAGNOSIS — N183 Chronic kidney disease, stage 3 (moderate): Secondary | ICD-10-CM

## 2018-06-14 DIAGNOSIS — C541 Malignant neoplasm of endometrium: Secondary | ICD-10-CM | POA: Insufficient documentation

## 2018-06-14 DIAGNOSIS — C7802 Secondary malignant neoplasm of left lung: Secondary | ICD-10-CM | POA: Diagnosis not present

## 2018-06-14 DIAGNOSIS — Z7189 Other specified counseling: Secondary | ICD-10-CM

## 2018-06-14 DIAGNOSIS — Z79890 Hormone replacement therapy (postmenopausal): Secondary | ICD-10-CM | POA: Insufficient documentation

## 2018-06-14 DIAGNOSIS — C7800 Secondary malignant neoplasm of unspecified lung: Secondary | ICD-10-CM | POA: Insufficient documentation

## 2018-06-14 DIAGNOSIS — G893 Neoplasm related pain (acute) (chronic): Secondary | ICD-10-CM

## 2018-06-14 DIAGNOSIS — T451X5A Adverse effect of antineoplastic and immunosuppressive drugs, initial encounter: Secondary | ICD-10-CM | POA: Diagnosis not present

## 2018-06-14 DIAGNOSIS — C787 Secondary malignant neoplasm of liver and intrahepatic bile duct: Secondary | ICD-10-CM

## 2018-06-14 DIAGNOSIS — C7801 Secondary malignant neoplasm of right lung: Secondary | ICD-10-CM | POA: Diagnosis not present

## 2018-06-14 DIAGNOSIS — Z79899 Other long term (current) drug therapy: Secondary | ICD-10-CM | POA: Insufficient documentation

## 2018-06-14 DIAGNOSIS — Z299 Encounter for prophylactic measures, unspecified: Secondary | ICD-10-CM

## 2018-06-14 DIAGNOSIS — G620 Drug-induced polyneuropathy: Secondary | ICD-10-CM

## 2018-06-14 DIAGNOSIS — Z0000 Encounter for general adult medical examination without abnormal findings: Secondary | ICD-10-CM

## 2018-06-14 LAB — CBC WITH DIFFERENTIAL/PLATELET
BASOS ABS: 0.1 10*3/uL (ref 0.0–0.1)
BASOS PCT: 1 %
EOS ABS: 0.2 10*3/uL (ref 0.0–0.5)
EOS PCT: 3 %
HCT: 33.4 % — ABNORMAL LOW (ref 34.8–46.6)
Hemoglobin: 10.6 g/dL — ABNORMAL LOW (ref 11.6–15.9)
LYMPHS PCT: 28 %
Lymphs Abs: 1.5 10*3/uL (ref 0.9–3.3)
MCH: 27.2 pg (ref 25.1–34.0)
MCHC: 31.7 g/dL (ref 31.5–36.0)
MCV: 85.9 fL (ref 79.5–101.0)
MONO ABS: 0.4 10*3/uL (ref 0.1–0.9)
Monocytes Relative: 8 %
Neutro Abs: 3.1 10*3/uL (ref 1.5–6.5)
Neutrophils Relative %: 60 %
PLATELETS: 311 10*3/uL (ref 145–400)
RBC: 3.89 MIL/uL (ref 3.70–5.45)
RDW: 14.5 % (ref 11.2–14.5)
WBC: 5.2 10*3/uL (ref 3.9–10.3)

## 2018-06-14 LAB — COMPREHENSIVE METABOLIC PANEL
ALBUMIN: 3.6 g/dL (ref 3.5–5.0)
ALT: <6 U/L (ref 0–44)
ANION GAP: 11 (ref 5–15)
AST: 16 U/L (ref 15–41)
Alkaline Phosphatase: 73 U/L (ref 38–126)
BILIRUBIN TOTAL: 0.3 mg/dL (ref 0.3–1.2)
BUN: 15 mg/dL (ref 8–23)
CALCIUM: 9.4 mg/dL (ref 8.9–10.3)
CO2: 26 mmol/L (ref 22–32)
Chloride: 104 mmol/L (ref 98–111)
Creatinine, Ser: 1.34 mg/dL — ABNORMAL HIGH (ref 0.44–1.00)
GFR calc Af Amer: 42 mL/min — ABNORMAL LOW (ref >60)
GFR calc non Af Amer: 36 mL/min — ABNORMAL LOW (ref >60)
GLUCOSE: 97 mg/dL (ref 70–99)
POTASSIUM: 4 mmol/L (ref 3.5–5.1)
Sodium: 141 mmol/L (ref 135–145)
TOTAL PROTEIN: 7.2 g/dL (ref 6.5–8.1)

## 2018-06-14 MED ORDER — DEXAMETHASONE 4 MG PO TABS: 4.0000 mg | ORAL_TABLET | Freq: Every day | ORAL | 0 refills | Status: DC

## 2018-06-14 MED ORDER — INFLUENZA VAC SPLIT QUAD 0.5 ML IM SUSY
PREFILLED_SYRINGE | INTRAMUSCULAR | Status: AC
  Filled 2018-06-14: qty 0.5

## 2018-06-14 MED ORDER — METOCLOPRAMIDE HCL 10 MG PO TABS: 10.0000 mg | ORAL_TABLET | Freq: Three times a day (TID) | ORAL | 1 refills | Status: DC | PRN

## 2018-06-14 MED ORDER — HYDROMORPHONE HCL 2 MG PO TABS: 2.0000 mg | ORAL_TABLET | ORAL | 0 refills | Status: DC | PRN

## 2018-06-14 MED ORDER — ONDANSETRON HCL 8 MG PO TABS: 8.0000 mg | ORAL_TABLET | Freq: Three times a day (TID) | ORAL | 3 refills | Status: AC | PRN

## 2018-06-14 MED ORDER — INFLUENZA VAC SPLIT QUAD 0.5 ML IM SUSY
0.5000 mL | PREFILLED_SYRINGE | Freq: Once | INTRAMUSCULAR | Status: AC
  Administered 2018-06-14: 0.5 mL via INTRAMUSCULAR

## 2018-06-14 NOTE — Assessment & Plan Note (Signed)
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

## 2018-06-14 NOTE — Patient Instructions (Signed)
Carboplatin injection What is this medicine? CARBOPLATIN (KAR boe pla tin) is a chemotherapy drug. It targets fast dividing cells, like cancer cells, and causes these cells to die. This medicine is used to treat ovarian cancer and many other cancers. This medicine may be used for other purposes; ask your health care provider or pharmacist if you have questions. COMMON BRAND NAME(S): Paraplatin What should I tell my health care provider before I take this medicine? They need to know if you have any of these conditions: -blood disorders -hearing problems -kidney disease -recent or ongoing radiation therapy -an unusual or allergic reaction to carboplatin, cisplatin, other chemotherapy, other medicines, foods, dyes, or preservatives -pregnant or trying to get pregnant -breast-feeding How should I use this medicine? This drug is usually given as an infusion into a vein. It is administered in a hospital or clinic by a specially trained health care professional. Talk to your pediatrician regarding the use of this medicine in children. Special care may be needed. Overdosage: If you think you have taken too much of this medicine contact a poison control center or emergency room at once. NOTE: This medicine is only for you. Do not share this medicine with others. What if I miss a dose? It is important not to miss a dose. Call your doctor or health care professional if you are unable to keep an appointment. What may interact with this medicine? -medicines for seizures -medicines to increase blood counts like filgrastim, pegfilgrastim, sargramostim -some antibiotics like amikacin, gentamicin, neomycin, streptomycin, tobramycin -vaccines Talk to your doctor or health care professional before taking any of these medicines: -acetaminophen -aspirin -ibuprofen -ketoprofen -naproxen This list may not describe all possible interactions. Give your health care provider a list of all the medicines, herbs,  non-prescription drugs, or dietary supplements you use. Also tell them if you smoke, drink alcohol, or use illegal drugs. Some items may interact with your medicine. What should I watch for while using this medicine? Your condition will be monitored carefully while you are receiving this medicine. You will need important blood work done while you are taking this medicine. This drug may make you feel generally unwell. This is not uncommon, as chemotherapy can affect healthy cells as well as cancer cells. Report any side effects. Continue your course of treatment even though you feel ill unless your doctor tells you to stop. In some cases, you may be given additional medicines to help with side effects. Follow all directions for their use. Call your doctor or health care professional for advice if you get a fever, chills or sore throat, or other symptoms of a cold or flu. Do not treat yourself. This drug decreases your body's ability to fight infections. Try to avoid being around people who are sick. This medicine may increase your risk to bruise or bleed. Call your doctor or health care professional if you notice any unusual bleeding. Be careful brushing and flossing your teeth or using a toothpick because you may get an infection or bleed more easily. If you have any dental work done, tell your dentist you are receiving this medicine. Avoid taking products that contain aspirin, acetaminophen, ibuprofen, naproxen, or ketoprofen unless instructed by your doctor. These medicines may hide a fever. Do not become pregnant while taking this medicine. Women should inform their doctor if they wish to become pregnant or think they might be pregnant. There is a potential for serious side effects to an unborn child. Talk to your health care professional or  pharmacist for more information. Do not breast-feed an infant while taking this medicine. What side effects may I notice from receiving this medicine? Side effects  that you should report to your doctor or health care professional as soon as possible: -allergic reactions like skin rash, itching or hives, swelling of the face, lips, or tongue -signs of infection - fever or chills, cough, sore throat, pain or difficulty passing urine -signs of decreased platelets or bleeding - bruising, pinpoint red spots on the skin, black, tarry stools, nosebleeds -signs of decreased red blood cells - unusually weak or tired, fainting spells, lightheadedness -breathing problems -changes in hearing -changes in vision -chest pain -high blood pressure -low blood counts - This drug may decrease the number of white blood cells, red blood cells and platelets. You may be at increased risk for infections and bleeding. -nausea and vomiting -pain, swelling, redness or irritation at the injection site -pain, tingling, numbness in the hands or feet -problems with balance, talking, walking -trouble passing urine or change in the amount of urine Side effects that usually do not require medical attention (report to your doctor or health care professional if they continue or are bothersome): -hair loss -loss of appetite -metallic taste in the mouth or changes in taste This list may not describe all possible side effects. Call your doctor for medical advice about side effects. You may report side effects to FDA at 1-800-FDA-1088. Where should I keep my medicine? This drug is given in a hospital or clinic and will not be stored at home. NOTE: This sheet is a summary. It may not cover all possible information. If you have questions about this medicine, talk to your doctor, pharmacist, or health care provider.  2018 Elsevier/Gold Standard (2007-12-21 14:38:05)  

## 2018-06-14 NOTE — Assessment & Plan Note (Signed)
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

## 2018-06-14 NOTE — Assessment & Plan Note (Signed)
I have extensive discussion about goals of care with the patient and her husband She understood that treatment is not curable We discussed importance of advanced directive and living will She agrees with DNR.

## 2018-06-14 NOTE — Assessment & Plan Note (Signed)
She has mild nonproductive cough and bilateral pulmonary metastasis I recommend observation only for now and we will start her on systemic treatment as soon as possible

## 2018-06-14 NOTE — Telephone Encounter (Signed)
Patient sent back to lab and given avs report and appointments for September and October. Per patient she has had [place] before.

## 2018-06-14 NOTE — Assessment & Plan Note (Signed)
We discussed the importance of preventive care and reviewed the vaccination programs. She does not have any prior allergic reactions to influenza vaccination. She agrees to proceed with influenza vaccination today and we will administer it today at the clinic.  

## 2018-06-14 NOTE — Assessment & Plan Note (Signed)
She has abdominal pain secondary to her cancer I recommend pain medicine to treat her cancer pain I warned her about risk of sedation and constipation I will assess her pain control next week.

## 2018-06-14 NOTE — Assessment & Plan Note (Signed)
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

## 2018-06-14 NOTE — Telephone Encounter (Signed)
Spoke to pt regarding upcoming appts per 9/16 sch message  °

## 2018-06-14 NOTE — Progress Notes (Signed)
[facility] OFFICE PROGRESS NOTE  Patient Care Team: Rita Ohara, MD as PCP - General (Family Medicine) Susa Day, MD as Consulting Physician (Orthopedic Surgery)  ASSESSMENT & PLAN:  Malignant neoplasm of endometrium [facility]) Unfortunately, recent CT scan of the chest, abdomen and pelvis showed disease recurrence in multiple sites The patient is symptomatic from peritoneal disease We had extensive discussion about the risk, benefits, side effects of chemotherapy She tolerated treatment very poorly in the past I recommend single agent carboplatin to start with I have requested pathologist to add additional testing for ER, PR, MSI and HER-2/neu to see if targeted therapies will be available for the patient in the future I will request port placement as soon as possible I will see her back next week before starting treatment for further assessment   Metastasis to lung [facility]) She has mild nonproductive cough and bilateral pulmonary metastasis I recommend observation only for now and we will start her on systemic treatment as soon as possible  Metastasis to liver [facility]) Imaging study revealed multiple liver metastasis She is not symptomatic Observe only.  Peritoneal metastases ([place]) She has signs and symptoms of peritoneal carcinomatosis with nausea, intermittent abdominal pain and changes in bowel habits I recommend laxative therapy  Cancer associated pain She has abdominal pain secondary to her cancer I recommend pain medicine to treat her cancer pain I warned her about risk of sedation and constipation I will assess her pain control next week.  Chemotherapy-induced neuropathy ([place]) She has residual peripheral neuropathy from prior treatment I do not recommend we proceed with Taxol for now Single agent carboplatin typically well-tolerated without significant risk of neuropathy.  Goals of care, counseling/discussion I have extensive discussion about goals of care  with the patient and her husband She understood that treatment is not curable We discussed importance of advanced directive and living will She agrees with DNR.  Hydronephrosis of right kidney Hydronephrosis of the right kidney is causing mild chronic kidney disease stage III For now, I do not recommend stent placement I will adjust dose of her chemotherapy accordingly.  Preventive measure We discussed the importance of preventive care and reviewed the vaccination programs. She does not have any prior allergic reactions to influenza vaccination. She agrees to proceed with influenza vaccination today and we will administer it today at the clinic.    Orders Placed This Encounter  Procedures  . IR IMAGING GUIDED PORT INSERTION    Standing Status:   Future    Standing Expiration Date:   08/15/2019    Order Specific Question:   Reason for Exam (SYMPTOM  OR DIAGNOSIS REQUIRED)    Answer:   need port to start chemo by 9/23    Order Specific Question:   Preferred Imaging Location?    Answer:   [facility]  . CBC with Differential/Platelet    Standing Status:   Standing    Number of Occurrences:   22    Standing Expiration Date:   06/15/2019  . Comprehensive metabolic panel    Standing Status:   Standing    Number of Occurrences:   22    Standing Expiration Date:   06/15/2019  . CA 125    Standing Status:   Standing    Number of Occurrences:   9    Standing Expiration Date:   06/15/2019    INTERVAL HISTORY: Please see below for problem oriented charting. The patient is seen urgently today due to recent abnormal CT imaging  findings. She started to complain of fatigue, abdominal pain, bloating, changes in bowel habits and feeling unwell for the last few months She was seen by her primary care doctor who ordered blood work and imaging studies which show signs of disease recurrence Her pain is not severe She has lost her appetite She has some mild residual peripheral neuropathy  from prior treatment She denies recent vomiting from treatment She is taking laxatives.  She has not receive her influenza vaccination. She has some nonproductive dry cough for several months but denies chest wall pain or shortness of breath on exertion  SUMMARY OF ONCOLOGIC HISTORY: Oncology History   Mixed endometrioid and serous     Malignant neoplasm of endometrium ([place])   04/11/2016 Pathology Results    Endometrium, curettage - HIGH GRADE ENDOMETRIAL CARCINOMA, SEE COMMENT. Microscopic Comment The overall appearance favors serous carcinoma.     04/23/2016 Initial Diagnosis    Patient presented to PCP with new vaginal discharge and some vaginal spotting, referred to Dr Dory Horn, whom she had known previously. D&C on 04-09-16 had high grade carcinoma favoring serous histology [phone]). She was referred to gyn oncology, saw Dr Denman George on 04-23-16.     04/30/2016 Imaging    Markedly thickened/widened endometrium consistent with known endometrial cancer. No evidence of serosal or extra uterine extension. 2. No findings to suggest metastatic disease involving the chest, abdomen or pelvis. 3. Indeterminant 12.5 mm right renal lesion, small enhancing mass versus hemorrhagic cyst. Attention on future scans is suggested. 4. Atherosclerotic calcifications involving the thoracic and abdominal aorta and branch vessels but no focal aneurysm. 5. Moderate stool throughout the colon and down into the rectum may suggest constipation.     05/06/2016 Surgery    Robotic hysterectomy and staging. IB USC, 0/11 nodes. Dispositioned to chemotherapy with paclitaxel and carboplatin x 6 with vaginal brachytherapy.    05/06/2016 Pathology Results    1. Lymph node, biopsy, right peri-aortic - ONE OF ONE LYMPH NODES NEGATIVE FOR MALIGNANCY (0/1). 2. Lymph node, biopsy, left peri-aortic - ONE OF ONE LYMPH NODES NEGATIVE FOR MALIGNANCY (0/1). 3. Lymph node, biopsy, right pelvic - FOUR OF FOUR LYMPH NODES NEGATIVE  FOR MALIGNANCY (0/4). 4. Lymph node, biopsy, left pelvic - FIVE OF FIVE LYMPH NODES NEGATIVE FOR MALIGNANCY (0/5). 5. Uterus +/- tubes/ovaries, neoplastic - UTERUS: -ENDO/MYOMETRIUM: INVASIVE MIXED ENDOMETRIOID AND SEROUS CARCINOMA, SPANNING 4 CM. TUMOR INVADES OUTER HALF OF MYOMETRIUM. LYMPHOVASCULAR INVASION PRESENT. SEE ONCOLOGY TABLE. LEIOMYOMA. -SEROSA: UNINVOLVED. NO MALIGNANCY. - CERVIX: BENIGN SQUAMOUS AND ENDOCERVICAL MUCOSA. NO DYSPLASIA OR MALIGNANCY. - BILATERAL OVARIES: INCLUSION CYSTS. NO MALIGNANCY. - BILATERAL FALLOPIAN TUBES: UNREMARKABLE. NO MALIGNANCY.  Specimen: Uterus, cervix, bilateral ovaries and fallopian tubes, bilateral pelvic and para-aortic lymph nodes. Procedure: Hysterectomy with bilateral salpingo-oophorectomy. Lymph node sampling performed: Bilateral pelvic and para-aortic lymph node biopsies Specimen integrity: Intact. 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    05/16/2016 Imaging    Ct abdomen: Extensive subcutaneous emphysema about the abdomen and pelvis, likely postoperative. Probable abdominal pelvic wall small volume hematomas, as above. 2. Right pelvic sidewall fluid collection is likely a seroma or lymphangioma. No explanation for left lower extremity pain. 3. Minimal ill-defined fluid in the presacral space and left adnexa. 4. Small hiatal hernia. 5. An incidental finding of potential clinical significance has been found. Indeterminate right renal lesion is similar to on the recent exam. Consider further evaluation with dedicated outpatient pre and post contrast abdominal MRI  06/11/2016 - 08/22/2016 Chemotherapy    The patient completed only three cycles due to toxicity from paclitaxel and carboplatin.     07/29/2016 Imaging    CT angiogram chest: No demonstrable pulmonary embolus. Multiple foci of atherosclerotic calcification in the aorta as well as foci of coronary artery calcification.   No edema or consolidation. No lung mass or nodule lesion. No adenopathy. Gallbladder absent.  Stable mild biliary duct prominence. Stable nodular opacity right lobe of thyroid which does not meet consensus guidelines criteria for further assessment    08/06/2016 - 09/03/2016 Radiation Therapy    HDR vaginal cuff brachytherapy x 5 fractions.    08/11/2016 Procedure    Ultrasound and fluoroscopically guided right internal jugular single lumen power port catheter insertion. Tip in the SVC/RA junction. Catheter ready for use.    09/23/2016 Imaging    Ct abdomen: No findings suspicious for metastatic disease in the abdomen or pelvis. 2. Thin-walled lobulated 6.9 x 5.8 x 5.8 cm right pelvic sidewall fluid collection, increased in size since 05/16/2016 CT study, favor a postoperative lymphocele, which demonstrates extrinsic mass-effect on the right bladder wall. 3. Indeterminate 1.2 cm posterior interpolar right renal cortical lesion, for which 4 month stability has been demonstrated, renal neoplasm not excluded. Recommend either dedicated renal protocol MRI or CT abdomen without and with IV contrast or continued attention on follow-up surveillance CT studies, as clinically warranted. 4. Additional findings include aortic atherosclerosis, moderate hiatal hernia, moderate sigmoid diverticulosis, tiny fat containing umbilical hernia, and degenerative disc disease, facet arthropathy and spondylolisthesis in the lower lumbar spine    09/30/2016 Procedure    She had successful CT-guided aspiration of right pelvic fluid collection. Approximately 90 mL yellow serous fluid was aspirated. Samples were sent for culture and cytology.    11/19/2016 Imaging    CT abdomen: No CT findings to suggest recurrent tumor, lymphadenopathy or metastatic disease. 2. Stable right-sided pelvic sidewall cyst with mass effect on the bladder. 3. Stable advanced atherosclerotic calcifications involving the aorta and iliac arteries. 4.  Moderate size hiatal hernia. 5. Status post cholecystectomy with stable intra and extrahepatic biliary dilatation    12/29/2016 Pathology Results    SOFT TISSUE, FINE NEEDLE ASPIRATION, RIGHT ADNEXAL CYST (SPECIMEN 1 OF 1 COLLECTED 09-30-2016) NO MALIGNANT CELLS IDENTIFIED.    02/17/2017 Imaging    CT abdomen 1. The previously noted thin walled cystic lesion in the right pelvis has undergone change in appearance ; it is now thick-walled and rim enhancing. There is associated inflammation and edema surrounding the pelvic sidewall lesion which has also increased in size. This suggests interim inflammation/infection of the cystic collection. 2. Urinary bladder displaced to the left by the thick-walled right pelvic sidewall cystic lesion. Wall thickening of the bladder is likely reactive but could also be due to a cystitis. Suggest correlation with urinalysis. 3. Stable intra and extrahepatic biliary dilatation post cholecystectomy 4. Stable 13 mm intermediate density right renal lesion 5. Diffuse diverticular disease of the colon without acute inflammation    02/17/2017 - 02/20/2017 Hospital Admission    She presented to the emergency department due to lower abdominal pain, diarrhea for 4 days associated with weakness, hypersomnolence following an episode of a gastroenteritis about a week ago after returning from the beach. She was told to decrease her Cymbalta to decrease somnolence. CT Abd/Pelvis was done which revealed that the previously seen thinwall cystic lesions in the right pelvis is now take wall and ring-enhancing with associated inflammation and edema surrounding the  pelvic sidewall lesions which also has increased in size. This suggests inter-inflammation/infection of the cystic collection. The urinary bladder is displaced to the left by these lesions. General Surgery, IR, and GYN were consulted.She underwent IR Drain Right Pelvic Drain placement on 5/23 and is improving. Drain Fluid Cx grew out  some Streptococcus Viridans and Cytology is negative. Her symptoms resolved with antibiotics     02/18/2017 Procedure    Successful CT guided placement of a 10 French all purpose drain catheter into the residual/recurrent right pelvic sidewall fluid collection with aspiration of 50 mL of purulent fluid. Samples were sent to the laboratory for both culture and cytologic analysis.    02/25/2017 Imaging    Significant improvement in the bilobed right pelvic sidewall abscess following percutaneous drain. Stable drain catheter position. Small residual abscess is noted, measurements as above. No new abscess. Otherwise stable CT of the abdomen pelvis with contrast.    03/11/2017 Imaging    Ct abdomen: Resolution of right pelvic infected fluid collection after percutaneous drainage. No further fluid is seen around the percutaneous drain and acute inflammatory changes also have nearly resolved    12/03/2017 Imaging    No abnormality identified within the pelvis.    03/23/2018 Procedure    Technically successful tunneled Port catheter removal.    06/11/2018 Imaging    There are several findings most likely related to metastatic malignancy described below:  Multiple bilateral pulmonary nodules and masses, some of which are cavitary.  Abnormal mediastinal adenopathy.  Multiple liver lesions.  Irregular soft tissue mass in the right side of the pelvis resulting in right ureteral obstruction.  Central mesentery mass in the abdomen extending to the ascending and transverse colon causing wall thickening.  2.4 cm lobulated mass in the distal sigmoid colon     REVIEW OF SYSTEMS:   Constitutional: Denies fevers, chills or abnormal weight loss Eyes: Denies blurriness of vision Ears, nose, mouth, throat, and face: Denies mucositis or sore throat Cardiovascular: Denies palpitation, chest discomfort or lower extremity swelling Skin: Denies abnormal skin rashes Lymphatics: Denies new lymphadenopathy  or easy bruising  Behavioral/Psych: Mood is stable, no new changes  All other systems were reviewed with the patient and are negative.  I have reviewed the past medical history, past surgical history, social history and family history with the patient and they are unchanged from previous note.  ALLERGIES:  is allergic to levofloxacin; promethazine hcl; sulfa antibiotics; sulfa drugs cross reactors; and sulfur.  MEDICATIONS:  Current Outpatient Medications  Medication Sig Dispense Refill  . cholecalciferol (VITAMIN D) 1000 units tablet Take 1,000 Units by mouth daily.     [name] dexamethasone (DECADRON) 4 MG tablet Take 1 tablet (4 mg total) by mouth daily. 30 tablet 0  . glucosamine-chondroitin 500-400 MG tablet Take 1 tablet by mouth 3 (three) times daily.    [name] HYDROmorphone (DILAUDID) 2 MG tablet Take 1 tablet (2 mg total) by mouth every 4 (four) hours as needed for severe pain. 30 tablet 0  . levothyroxine (SYNTHROID, LEVOTHROID) 88 MCG tablet TAKE ONE TABLET BY MOUTH ONCE DAILY BEFORE  BREAKFAST. (Patient taking differently: Take 88 mcg by mouth daily before breakfast. ) 90 tablet 3  . Melatonin 5 MG CAPS Take 5 mg by mouth at bedtime.     . metoCLOPramide (REGLAN) 10 MG tablet Take 1 tablet (10 mg total) by mouth every 8 (eight) hours as needed for nausea. 30 tablet 1  . Multiple Vitamins-Minerals (MULTIVITAMIN WITH MINERALS) tablet Take 1  tablet by mouth daily.    [name] omeprazole (PRILOSEC) 40 MG capsule Take 1 capsule (40 mg total) by mouth daily. 30 capsule 1  . ondansetron (ZOFRAN) 8 MG tablet Take 1 tablet (8 mg total) by mouth every 8 (eight) hours as needed for nausea. 30 tablet 3  . Polyethyl Glycol-Propyl Glycol (SYSTANE ULTRA) 0.4-0.3 % SOLN Place 1 drop into both eyes daily as needed (for dry eyes).    . polyethylene glycol (MIRALAX / GLYCOLAX) packet Take 17 g by mouth 2 (two) times daily as needed for mild constipation.     . vitamin B-12 (CYANOCOBALAMIN) 1000 MCG tablet Take 1,000  mcg by mouth daily.     No current facility-administered medications for this visit.    Facility-Administered Medications Ordered in Other Visits  Medication Dose Route Frequency Provider Last Rate Last Dose  . influenza  inactive virus vaccine (FLUZONE/FLUARIX) injection 0.5 mL  0.5 mL Intramuscular Once Rita Ohara, MD        PHYSICAL EXAMINATION: ECOG PERFORMANCE STATUS: 2 - Symptomatic, <50% confined to bed  Vitals:   06/14/18 1156  BP: (!) 163/77  Pulse: 81  Resp: 18  Temp: 97.8 F (36.6 C)  SpO2: 100%   Filed Weights   06/14/18 1156  Weight: 186 lb 4.8 oz (84.5 kg)    GENERAL:alert, no distress and comfortable SKIN: skin color, texture, turgor are normal, no rashes or significant lesions EYES: normal, Conjunctiva are pink and non-injected, sclera clear OROPHARYNX:no exudate, no erythema and lips, buccal mucosa, and tongue normal  NECK: supple, thyroid normal size, non-tender, without nodularity LYMPH:  no palpable lymphadenopathy in the cervical, axillary or inguinal LUNGS: clear to auscultation and percussion with normal breathing effort HEART: regular rate & rhythm and no murmurs and no lower extremity edema ABDOMEN:abdomen soft, non-tender and normal bowel sounds Musculoskeletal:no cyanosis of digits and no clubbing  NEURO: alert & oriented x 3 with fluent speech, no focal motor/sensory deficits  LABORATORY DATA:  I have reviewed the data as listed    Component Value Date/Time   NA 141 06/14/2018 1323   NA 142 06/10/2018 1118   NA 142 03/16/2017 0852   K 4.0 06/14/2018 1323   K 4.2 03/16/2017 0852   CL 104 06/14/2018 1323   CO2 26 06/14/2018 1323   CO2 28 03/16/2017 0852   GLUCOSE 97 06/14/2018 1323   GLUCOSE 93 03/16/2017 0852   BUN 15 06/14/2018 1323   BUN 20 06/10/2018 1118   BUN 9.9 03/16/2017 0852   CREATININE 1.34 (H) 06/14/2018 1323   CREATININE 1.35 (H) 05/06/2018 1215   CREATININE 0.86 06/25/2017 1119   CREATININE 0.8 03/16/2017 0852    CALCIUM 9.4 06/14/2018 1323   CALCIUM 9.2 03/16/2017 0852   PROT 7.2 06/14/2018 1323   PROT 6.7 06/10/2018 1118   PROT 7.1 03/16/2017 0852   ALBUMIN 3.6 06/14/2018 1323   ALBUMIN 4.4 06/10/2018 1118   ALBUMIN 3.1 (L) 03/16/2017 0852   AST 16 06/14/2018 1323   AST 16 05/06/2018 1215   AST 19 03/16/2017 0852   ALT <6 06/14/2018 1323   ALT <6 05/06/2018 1215   ALT 9 03/16/2017 0852   ALKPHOS 73 06/14/2018 1323   ALKPHOS 64 03/16/2017 0852   BILITOT 0.3 06/14/2018 1323   BILITOT <0.2 06/10/2018 1118   BILITOT 0.3 05/06/2018 1215   BILITOT 0.27 03/16/2017 0852   GFRNONAA 36 (L) 06/14/2018 1323   GFRNONAA 36 (L) 05/06/2018 1215   GFRAA 42 (L) 06/14/2018 1323  GFRAA 41 (L) 05/06/2018 1215    No results found for: SPEP, UPEP  Lab Results  Component Value Date   WBC 5.2 06/14/2018   NEUTROABS 3.1 06/14/2018   HGB 10.6 (L) 06/14/2018   HCT 33.4 (L) 06/14/2018   MCV 85.9 06/14/2018   PLT 311 06/14/2018      Chemistry      Component Value Date/Time   NA 141 06/14/2018 1323   NA 142 06/10/2018 1118   NA 142 03/16/2017 0852   K 4.0 06/14/2018 1323   K 4.2 03/16/2017 0852   CL 104 06/14/2018 1323   CO2 26 06/14/2018 1323   CO2 28 03/16/2017 0852   BUN 15 06/14/2018 1323   BUN 20 06/10/2018 1118   BUN 9.9 03/16/2017 0852   CREATININE 1.34 (H) 06/14/2018 1323   CREATININE 1.35 (H) 05/06/2018 1215   CREATININE 0.86 06/25/2017 1119   CREATININE 0.8 03/16/2017 0852      Component Value Date/Time   CALCIUM 9.4 06/14/2018 1323   CALCIUM 9.2 03/16/2017 0852   ALKPHOS 73 06/14/2018 1323   ALKPHOS 64 03/16/2017 0852   AST 16 06/14/2018 1323   AST 16 05/06/2018 1215   AST 19 03/16/2017 0852   ALT <6 06/14/2018 1323   ALT <6 05/06/2018 1215   ALT 9 03/16/2017 0852   BILITOT 0.3 06/14/2018 1323   BILITOT <0.2 06/10/2018 1118   BILITOT 0.3 05/06/2018 1215   BILITOT 0.27 03/16/2017 0852       RADIOGRAPHIC STUDIES: I have reviewed multiple imaging study with the patient  and family I have personally reviewed the radiological images as listed and agreed with the findings in the report. Ct Abdomen Pelvis Wo Contrast  Result Date: 06/11/2018 CLINICAL DATA:  Endometrial cancer EXAM: CT CHEST, ABDOMEN AND PELVIS WITHOUT CONTRAST TECHNIQUE: Multidetector CT imaging of the chest, abdomen and pelvis was performed following the standard protocol without IV contrast. COMPARISON:  03/11/2017, 02/25/2017, 07/29/2016 FINDINGS: CT CHEST FINDINGS Cardiovascular: No evidence of aortic aneurysm. Atherosclerotic calcifications of the thoracic aorta are noted. Left main coronary artery calcification. Mediastinum/Nodes: Abnormal mediastinal adenopathy. 1.6 cm right paratracheal node on image 14. 1.5 cm pretracheal node on image 21. 1.8 cm precarinal node on image 28. No pericardial effusion. Small right thyroid hypodensity is stable. Moderate-sized hiatal hernia. Lungs/Pleura: No pneumothorax. Small bilateral pleural effusions. Bilateral lung masses and nodules. 2.1 cm lobulated right upper lobe nodule on image 46. Anterior ground-glass opacity in the right upper lobe on image 46. Subcentimeter lobulated right upper lobe nodule on image 72. 0.6 cm cavitary nodule in right lower lobe on image 76. Posterior pleural based nodule in the right lower lobe measures 0.8 cm on image 90. 7.3 cm right lower lobe posterior lung mass on image 111. Spiculated left upper lobe nodule measures 1 cm on image 38. 0.6 cm left upper lobe nodule on image 48. 1.5 cm cavitary left upper lobe nodule on image 63. Sub solid nodule in the superior segment of the left lower lobe on image 65. Spiculated left upper lobe pulmonary nodule measures 1 cm on image 71. Musculoskeletal: No vertebral compression deformity. No destructive bone lesion. Midthoracic vertebral hemangioma is noted. CT ABDOMEN PELVIS FINDINGS Hepatobiliary: 0.8 cm right lobe liver lesion on image 56. Anterior right lobe liver lesion on image 63 measures 2.5  cm. 1.2 cm inferior right lobe liver lesion on image 69 other smaller right lobe liver lesions are suspected. Postcholecystectomy. Pancreas: Unremarkable Spleen: Unremarkable Adrenals/Urinary Tract: Severe right hydronephrosis is  associated with right renal cortical atrophy. Left kidney and adrenal glands are unremarkable. Right hydroureter these to a large right pelvic irregular soft tissue mass measuring 4.7 x 3.4 cm. It extends to the right pelvic sidewall and is inseparable from the right side of the bladder. There is associated wall thickening in the right side of the bladder. This is in the location of the patient's previous abscess. Stomach/Bowel: Prominent stool burden in the descending and sigmoid colon. There is wall thickening in the ascending colon and transverse colon associated with an ill-defined mass within the mesentery measuring 2.8 x 11.5 cm. There is a lobulated 2.4 cm soft tissue mass along the distal sigmoid colon on image 99. There is no evidence of small-bowel obstruction. Vascular/Lymphatic: Atherosclerotic calcifications of the aorta and iliac arteries. Small retroperitoneal lymph nodes along the aorta. Reproductive: Uterus is absent. Other: Small amount of free fluid layers in the pelvis. Musculoskeletal: No destructive bone lesion. No vertebral compression deformity. IMPRESSION: There are several findings most likely related to metastatic malignancy described below: Multiple bilateral pulmonary nodules and masses, some of which are cavitary. Abnormal mediastinal adenopathy. Multiple liver lesions. Irregular soft tissue mass in the right side of the pelvis resulting in right ureteral obstruction. Central mesentery mass in the abdomen extending to the ascending and transverse colon causing wall thickening. 2.4 cm lobulated mass in the distal sigmoid colon. Electronically Signed   By: Marybelle Killings M.D.   On: 06/11/2018 18:23   Dg Chest 2 View  Result Date: 06/10/2018 CLINICAL DATA:   Fatigue for 6 months. History of endometrial carcinoma. EXAM: CHEST - 2 VIEW COMPARISON:  PA and lateral chest 02/17/2017. FINDINGS: The patient has a pleural based mass in the right lower lobe measuring 7.4 cm craniocaudal by 5.4 cm AP by 6.9 cm transverse. A new nodule in the right upper lobe measures 2.1 cm craniocaudal. There is also a new nodule in the left upper lobe measuring 1.3 cm transverse. No consolidative process, pneumothorax or effusion. Aortic atherosclerosis is noted. Heart size is normal. No acute or focal bony abnormality. IMPRESSION: New large with pleural based mass in the right lower lobe and nodules in both the right and left lung are worrisome for metastatic disease in this patient with a history of endometrial carcinoma. Recommend CT chest, abdomen and pelvis with contrast for further evaluation. Electronically Signed   By: Inge Rise M.D.   On: 06/10/2018 12:09   Ct Chest Wo Contrast  Result Date: 06/11/2018 CLINICAL DATA:  Endometrial cancer EXAM: CT CHEST, ABDOMEN AND PELVIS WITHOUT CONTRAST TECHNIQUE: Multidetector CT imaging of the chest, abdomen and pelvis was performed following the standard protocol without IV contrast. COMPARISON:  03/11/2017, 02/25/2017, 07/29/2016 FINDINGS: CT CHEST FINDINGS Cardiovascular: No evidence of aortic aneurysm. Atherosclerotic calcifications of the thoracic aorta are noted. Left main coronary artery calcification. Mediastinum/Nodes: Abnormal mediastinal adenopathy. 1.6 cm right paratracheal node on image 14. 1.5 cm pretracheal node on image 21. 1.8 cm precarinal node on image 28. No pericardial effusion. Small right thyroid hypodensity is stable. Moderate-sized hiatal hernia. Lungs/Pleura: No pneumothorax. Small bilateral pleural effusions. Bilateral lung masses and nodules. 2.1 cm lobulated right upper lobe nodule on image 46. Anterior ground-glass opacity in the right upper lobe on image 46. Subcentimeter lobulated right upper lobe nodule on  image 72. 0.6 cm cavitary nodule in right lower lobe on image 76. Posterior pleural based nodule in the right lower lobe measures 0.8 cm on image 90. 7.3 cm right lower lobe posterior  lung mass on image 111. Spiculated left upper lobe nodule measures 1 cm on image 38. 0.6 cm left upper lobe nodule on image 48. 1.5 cm cavitary left upper lobe nodule on image 63. Sub solid nodule in the superior segment of the left lower lobe on image 65. Spiculated left upper lobe pulmonary nodule measures 1 cm on image 71. Musculoskeletal: No vertebral compression deformity. No destructive bone lesion. Midthoracic vertebral hemangioma is noted. CT ABDOMEN PELVIS FINDINGS Hepatobiliary: 0.8 cm right lobe liver lesion on image 56. Anterior right lobe liver lesion on image 63 measures 2.5 cm. 1.2 cm inferior right lobe liver lesion on image 69 other smaller right lobe liver lesions are suspected. Postcholecystectomy. Pancreas: Unremarkable Spleen: Unremarkable Adrenals/Urinary Tract: Severe right hydronephrosis is associated with right renal cortical atrophy. Left kidney and adrenal glands are unremarkable. Right hydroureter these to a large right pelvic irregular soft tissue mass measuring 4.7 x 3.4 cm. It extends to the right pelvic sidewall and is inseparable from the right side of the bladder. There is associated wall thickening in the right side of the bladder. This is in the location of the patient's previous abscess. Stomach/Bowel: Prominent stool burden in the descending and sigmoid colon. There is wall thickening in the ascending colon and transverse colon associated with an ill-defined mass within the mesentery measuring 2.8 x 11.5 cm. There is a lobulated 2.4 cm soft tissue mass along the distal sigmoid colon on image 99. There is no evidence of small-bowel obstruction. Vascular/Lymphatic: Atherosclerotic calcifications of the aorta and iliac arteries. Small retroperitoneal lymph nodes along the aorta. Reproductive: Uterus  is absent. Other: Small amount of free fluid layers in the pelvis. Musculoskeletal: No destructive bone lesion. No vertebral compression deformity. IMPRESSION: There are several findings most likely related to metastatic malignancy described below: Multiple bilateral pulmonary nodules and masses, some of which are cavitary. Abnormal mediastinal adenopathy. Multiple liver lesions. Irregular soft tissue mass in the right side of the pelvis resulting in right ureteral obstruction. Central mesentery mass in the abdomen extending to the ascending and transverse colon causing wall thickening. 2.4 cm lobulated mass in the distal sigmoid colon. Electronically Signed   By: Marybelle Killings M.D.   On: 06/11/2018 18:23    All questions were answered. The patient knows to call the clinic with any problems, questions or concerns. No barriers to learning was detected.  I spent 60 minutes counseling the patient face to face. The total time spent in the appointment was 80 minutes and more than 50% was on counseling and review of test results  [name], MD 06/14/2018 5:20 PM

## 2018-06-14 NOTE — Progress Notes (Signed)
DISCONTINUE ON PATHWAY REGIMEN - Uterine     A cycle is every 21 days:     Paclitaxel        Dose Mod: None     Carboplatin        Dose Mod: None  **Always confirm dose/schedule in your pharmacy ordering system**  REASON: Disease Progression PRIOR TREATMENT: UTOS50: Carboplatin + Paclitaxel (6/175) q21 Days x 6 Cycles TREATMENT RESPONSE: Progressive Disease (PD)  START ON PATHWAY REGIMEN - Uterine     A cycle is every 21 days:     Paclitaxel      Carboplatin   **Always confirm dose/schedule in your pharmacy ordering system**  Administration Notes: No taxol  Patient Characteristics: Endometrioid Histology, Recurrent/Progressive Disease, Second Line, Systemic Recurrence, > 6 Months Since Previous Chemotherapy Histology: Endometrioid Histology Therapeutic Status: Recurrent or Progressive Disease AJCC T Category: T1 AJCC N Category: N1 AJCC M Category: M1 AJCC 8 Stage Grouping: IVB Line of Therapy: Second Line Time to Recurrence: > 6 Months Since Previous Chemotherapy Intent of Therapy: Non-Curative / Palliative Intent, Discussed with Patient

## 2018-06-14 NOTE — Assessment & Plan Note (Signed)
Imaging study revealed multiple liver metastasis She is not symptomatic Observe only.

## 2018-06-14 NOTE — Assessment & Plan Note (Signed)
She has signs and symptoms of peritoneal carcinomatosis with nausea, intermittent abdominal pain and changes in bowel habits I recommend laxative therapy

## 2018-06-15 LAB — CA 125: Cancer Antigen (CA) 125: 96.6 U/mL — ABNORMAL HIGH (ref 0.0–38.1)

## 2018-06-15 NOTE — Progress Notes (Signed)
PA for Ondansetron 8 mg has been submitted.

## 2018-06-16 ENCOUNTER — Other Ambulatory Visit: Payer: Self-pay | Admitting: Physician Assistant

## 2018-06-17 ENCOUNTER — Other Ambulatory Visit: Payer: Medicare Other

## 2018-06-17 ENCOUNTER — Other Ambulatory Visit: Payer: Self-pay | Admitting: Radiology

## 2018-06-17 ENCOUNTER — Encounter: Payer: Self-pay | Admitting: *Deleted

## 2018-06-18 ENCOUNTER — Telehealth: Payer: Self-pay

## 2018-06-18 ENCOUNTER — Ambulatory Visit (HOSPITAL_COMMUNITY)
Admission: RE | Admit: 2018-06-18 | Discharge: 2018-06-18 | Disposition: A | Discharge status: Home / Self Care | Payer: Medicare Other | Source: Ambulatory Visit | Attending: Hematology and Oncology | Admitting: Hematology and Oncology

## 2018-06-18 ENCOUNTER — Other Ambulatory Visit: Payer: Self-pay

## 2018-06-18 DIAGNOSIS — C541 Malignant neoplasm of endometrium: Secondary | ICD-10-CM | POA: Diagnosis present

## 2018-06-18 MED ORDER — CEFAZOLIN SODIUM-DEXTROSE 2-4 GM/100ML-% IV SOLN: 2.0000 g | Freq: Once | INTRAVENOUS | Status: DC

## 2018-06-18 MED ORDER — SODIUM CHLORIDE 0.9 % IV SOLN: INTRAVENOUS | Status: DC

## 2018-06-18 NOTE — Telephone Encounter (Signed)
Caryl Pina called from [facility] IR. Kayla Guerrero was scheduled for port placement today. Procedure canceled due to patient eating. They will reschedule for next week.

## 2018-06-21 ENCOUNTER — Telehealth: Payer: Self-pay

## 2018-06-21 ENCOUNTER — Inpatient Hospital Stay: Payer: Medicare Other

## 2018-06-21 ENCOUNTER — Inpatient Hospital Stay (HOSPITAL_BASED_OUTPATIENT_CLINIC_OR_DEPARTMENT_OTHER): Payer: Medicare Other | Admitting: Hematology and Oncology

## 2018-06-21 ENCOUNTER — Telehealth: Payer: Self-pay | Admitting: Hematology and Oncology

## 2018-06-21 DIAGNOSIS — T451X5A Adverse effect of antineoplastic and immunosuppressive drugs, initial encounter: Secondary | ICD-10-CM

## 2018-06-21 DIAGNOSIS — Z5111 Encounter for antineoplastic chemotherapy: Secondary | ICD-10-CM | POA: Diagnosis not present

## 2018-06-21 DIAGNOSIS — C541 Malignant neoplasm of endometrium: Secondary | ICD-10-CM | POA: Diagnosis not present

## 2018-06-21 DIAGNOSIS — Z7189 Other specified counseling: Secondary | ICD-10-CM

## 2018-06-21 DIAGNOSIS — C786 Secondary malignant neoplasm of retroperitoneum and peritoneum: Secondary | ICD-10-CM

## 2018-06-21 DIAGNOSIS — C7802 Secondary malignant neoplasm of left lung: Secondary | ICD-10-CM | POA: Diagnosis not present

## 2018-06-21 DIAGNOSIS — N1330 Unspecified hydronephrosis: Secondary | ICD-10-CM

## 2018-06-21 DIAGNOSIS — C7801 Secondary malignant neoplasm of right lung: Secondary | ICD-10-CM

## 2018-06-21 DIAGNOSIS — G893 Neoplasm related pain (acute) (chronic): Secondary | ICD-10-CM

## 2018-06-21 DIAGNOSIS — G620 Drug-induced polyneuropathy: Secondary | ICD-10-CM

## 2018-06-21 DIAGNOSIS — C787 Secondary malignant neoplasm of liver and intrahepatic bile duct: Secondary | ICD-10-CM

## 2018-06-21 MED ORDER — LIDOCAINE-PRILOCAINE 2.5-2.5 % EX CREA: 1.0000 "application " | TOPICAL_CREAM | CUTANEOUS | 6 refills | Status: AC | PRN

## 2018-06-21 MED ORDER — SODIUM CHLORIDE 0.9 % IV SOLN
344.5000 mg | Freq: Once | INTRAVENOUS | Status: AC
  Administered 2018-06-21: 340 mg via INTRAVENOUS
  Filled 2018-06-21: qty 34

## 2018-06-21 MED ORDER — PALONOSETRON HCL INJECTION 0.25 MG/5ML
INTRAVENOUS | Status: AC
  Filled 2018-06-21: qty 5

## 2018-06-21 MED ORDER — SODIUM CHLORIDE 0.9 % IV SOLN
Freq: Once | INTRAVENOUS | Status: AC
  Administered 2018-06-21: 15:00:00 via INTRAVENOUS
  Filled 2018-06-21: qty 5

## 2018-06-21 MED ORDER — SODIUM CHLORIDE 0.9 % IV SOLN
Freq: Once | INTRAVENOUS | Status: AC
  Administered 2018-06-21: 14:00:00 via INTRAVENOUS
  Filled 2018-06-21: qty 250

## 2018-06-21 MED ORDER — PALONOSETRON HCL INJECTION 0.25 MG/5ML
0.2500 mg | Freq: Once | INTRAVENOUS | Status: AC
  Administered 2018-06-21: 0.25 mg via INTRAVENOUS

## 2018-06-21 NOTE — Patient Instructions (Signed)
[facility] Discharge Instructions for Patients Receiving Chemotherapy  Today you received the following chemotherapy agents: Carboplatin (Paraplatin)  To help prevent nausea and vomiting after your treatment, we encourage you to take your nausea medication as directed.    If you develop nausea and vomiting that is not controlled by your nausea medication, call the clinic.   BELOW ARE SYMPTOMS THAT SHOULD BE REPORTED IMMEDIATELY:  *FEVER GREATER THAN 100.5 F  *CHILLS WITH OR WITHOUT FEVER  NAUSEA AND VOMITING THAT IS NOT CONTROLLED WITH YOUR NAUSEA MEDICATION  *UNUSUAL SHORTNESS OF BREATH  *UNUSUAL BRUISING OR BLEEDING  TENDERNESS IN MOUTH AND THROAT WITH OR WITHOUT PRESENCE OF ULCERS  *URINARY PROBLEMS  *BOWEL PROBLEMS  UNUSUAL RASH Items with * indicate a potential emergency and should be followed up as soon as possible.  Feel free to call the clinic should you have any questions or concerns. The clinic phone number is [phone].  Please show the CHEMO ALERT CARD at check-in to the Emergency Department and triage nurse.  Carboplatin injection What is this medicine? CARBOPLATIN (KAR boe pla tin) is a chemotherapy drug. It targets fast dividing cells, like cancer cells, and causes these cells to die. This medicine is used to treat ovarian cancer and many other cancers. This medicine may be used for other purposes; ask your health care provider or pharmacist if you have questions. COMMON BRAND NAME(S): Paraplatin What should I tell my health care provider before I take this medicine? They need to know if you have any of these conditions: -blood disorders -hearing problems -kidney disease -recent or ongoing radiation therapy -an unusual or allergic reaction to carboplatin, cisplatin, other chemotherapy, other medicines, foods, dyes, or preservatives -pregnant or trying to get pregnant -breast-feeding How should I use this medicine? This drug is  usually given as an infusion into a vein. It is administered in a hospital or clinic by a specially trained health care professional. Talk to your pediatrician regarding the use of this medicine in children. Special care may be needed. Overdosage: If you think you have taken too much of this medicine contact a poison control center or emergency room at once. NOTE: This medicine is only for you. Do not share this medicine with others. What if I miss a dose? It is important not to miss a dose. Call your doctor or health care professional if you are unable to keep an appointment. What may interact with this medicine? -medicines for seizures -medicines to increase blood counts like filgrastim, pegfilgrastim, sargramostim -some antibiotics like amikacin, gentamicin, neomycin, streptomycin, tobramycin -vaccines Talk to your doctor or health care professional before taking any of these medicines: -acetaminophen -aspirin -ibuprofen -ketoprofen -naproxen This list may not describe all possible interactions. Give your health care provider a list of all the medicines, herbs, non-prescription drugs, or dietary supplements you use. Also tell them if you smoke, drink alcohol, or use illegal drugs. Some items may interact with your medicine. What should I watch for while using this medicine? Your condition will be monitored carefully while you are receiving this medicine. You will need important blood work done while you are taking this medicine. This drug may make you feel generally unwell. This is not uncommon, as chemotherapy can affect healthy cells as well as cancer cells. Report any side effects. Continue your course of treatment even though you feel ill unless your doctor tells you to stop. In some cases, you may be given additional medicines to help with side effects.   Follow all directions for their use. Call your doctor or health care professional for advice if you get a fever, chills or sore throat,  or other symptoms of a cold or flu. Do not treat yourself. This drug decreases your body's ability to fight infections. Try to avoid being around people who are sick. This medicine may increase your risk to bruise or bleed. Call your doctor or health care professional if you notice any unusual bleeding. Be careful brushing and flossing your teeth or using a toothpick because you may get an infection or bleed more easily. If you have any dental work done, tell your dentist you are receiving this medicine. Avoid taking products that contain aspirin, acetaminophen, ibuprofen, naproxen, or ketoprofen unless instructed by your doctor. These medicines may hide a fever. Do not become pregnant while taking this medicine. Women should inform their doctor if they wish to become pregnant or think they might be pregnant. There is a potential for serious side effects to an unborn child. Talk to your health care professional or pharmacist for more information. Do not breast-feed an infant while taking this medicine. What side effects may I notice from receiving this medicine? Side effects that you should report to your doctor or health care professional as soon as possible: -allergic reactions like skin rash, itching or hives, swelling of the face, lips, or tongue -signs of infection - fever or chills, cough, sore throat, pain or difficulty passing urine -signs of decreased platelets or bleeding - bruising, pinpoint red spots on the skin, black, tarry stools, nosebleeds -signs of decreased red blood cells - unusually weak or tired, fainting spells, lightheadedness -breathing problems -changes in hearing -changes in vision -chest pain -high blood pressure -low blood counts - This drug may decrease the number of white blood cells, red blood cells and platelets. You may be at increased risk for infections and bleeding. -nausea and vomiting -pain, swelling, redness or irritation at the injection site -pain,  tingling, numbness in the hands or feet -problems with balance, talking, walking -trouble passing urine or change in the amount of urine Side effects that usually do not require medical attention (report to your doctor or health care professional if they continue or are bothersome): -hair loss -loss of appetite -metallic taste in the mouth or changes in taste This list may not describe all possible side effects. Call your doctor for medical advice about side effects. You may report side effects to FDA at 1-800-FDA-1088. Where should I keep my medicine? This drug is given in a hospital or clinic and will not be stored at home. NOTE: This sheet is a summary. It may not cover all possible information. If you have questions about this medicine, talk to your doctor, pharmacist, or health care provider.  2018 Elsevier/Gold Standard (2007-12-21 14:38:05)  

## 2018-06-21 NOTE — Telephone Encounter (Signed)
Gave patient avs and calendar.   °

## 2018-06-21 NOTE — Telephone Encounter (Signed)
Per Dr Alvy Bimler OK for [phone] of Carboplatin with blood work results from 06/14/2018

## 2018-06-22 ENCOUNTER — Encounter: Payer: Self-pay | Admitting: Hematology and Oncology

## 2018-06-22 NOTE — Assessment & Plan Note (Signed)
She has signs and symptoms of peritoneal carcinomatosis with nausea, intermittent abdominal pain and changes in bowel habits I recommend laxative therapy This has improved since the last time I saw her with regular regimen of Senokot and MiraLAX.

## 2018-06-22 NOTE — Assessment & Plan Note (Signed)
I have extensive discussion about goals of care with the patient and her husband She understood that treatment is not curable We discussed importance of advanced directive and living will She agrees with DNR.

## 2018-06-22 NOTE — Assessment & Plan Note (Signed)
She has significant residual peripheral neuropathy from prior treatment For that reason, I did not add Taxol.  Monitor closely for now

## 2018-06-22 NOTE — Assessment & Plan Note (Signed)
Her cancer associated pain control has improved She will continue to take pain medicine as needed.

## 2018-06-22 NOTE — Assessment & Plan Note (Signed)
She is currently not symptomatic.  Recent renal function is stable.  Monitor closely. I explained to the patient and her husband the rationale of not pursuing kidney stent

## 2018-06-22 NOTE — Assessment & Plan Note (Signed)
We reviewed the NCCN guidelines We discussed the role of chemotherapy. The intent is of palliative intent.  We discussed some of the risks, benefits, side-effects of carboplatin. Treatment is intravenous, every 3 weeks x 6 cycles  Some of the short term side-effects included, though not limited to, including weight loss, life threatening infections, risk of allergic reactions, need for transfusions of blood products, nausea, vomiting, change in bowel habits, loss of hair, admission to hospital for various reasons, and risks of death.   Long term side-effects are also discussed including risks of infertility, permanent damage to nerve function, hearing loss, chronic fatigue, kidney damage with possibility needing hemodialysis, and rare secondary malignancy including bone marrow disorders.  The patient is aware that the response rates discussed earlier is not guaranteed.  After a long discussion, patient made an informed decision to proceed with the prescribed plan of care.   Patient education material was dispensed. I recommend close monitoring of tumor marker and repeat imaging study after 3 months of treatment

## 2018-06-22 NOTE — Progress Notes (Signed)
[place] OFFICE PROGRESS NOTE  Patient Care Team: Rita Ohara, MD as PCP - General (Family Medicine) Susa Day, MD as Consulting Physician (Orthopedic Surgery)  ASSESSMENT & PLAN:  Malignant neoplasm of endometrium [facility]) We reviewed the NCCN guidelines We discussed the role of chemotherapy. The intent is of palliative intent.  We discussed some of the risks, benefits, side-effects of carboplatin. Treatment is intravenous, every 3 weeks x 6 cycles  Some of the short term side-effects included, though not limited to, including weight loss, life threatening infections, risk of allergic reactions, need for transfusions of blood products, nausea, vomiting, change in bowel habits, loss of hair, admission to hospital for various reasons, and risks of death.   Long term side-effects are also discussed including risks of infertility, permanent damage to nerve function, hearing loss, chronic fatigue, kidney damage with possibility needing hemodialysis, and rare secondary malignancy including bone marrow disorders.  The patient is aware that the response rates discussed earlier is not guaranteed.  After a long discussion, patient made an informed decision to proceed with the prescribed plan of care.   Patient education material was dispensed. I recommend close monitoring of tumor marker and repeat imaging study after 3 months of treatment   Cancer associated pain Her cancer associated pain control has improved She will continue to take pain medicine as needed.  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  Monitor closely for now  Hydronephrosis of right kidney She is currently not symptomatic.  Recent renal function is stable.  Monitor closely. I explained to the patient and her husband the rationale of not pursuing kidney stent  Peritoneal metastases ([place]) She has  signs and symptoms of peritoneal carcinomatosis with nausea, intermittent abdominal pain and changes in bowel habits I recommend laxative therapy This has improved since the last time I saw her with regular regimen of Senokot and MiraLAX.  Goals of care, counseling/discussion I have extensive discussion about goals of care with the patient and her husband She understood that treatment is not curable We discussed importance of advanced directive and living will She agrees with DNR.   No orders of the defined types were placed in this encounter.   INTERVAL HISTORY: Please see below for problem oriented charting. 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 Microscopic Comment The overall appearance favors serous carcinoma.     04/23/2016 Initial Diagnosis    Patient presented to PCP with new vaginal discharge and some vaginal spotting, referred to Dr Dory Horn, whom she had known previously. D&C on 04-09-16 had high grade carcinoma favoring serous histology [phone]). She was referred to gyn oncology, saw Dr Denman George on 04-23-16.     04/30/2016 Imaging    Markedly thickened/widened endometrium consistent with known endometrial cancer. No evidence of serosal or extra uterine extension. 2. No findings to suggest metastatic disease involving the chest, abdomen or pelvis. 3. Indeterminant 12.5  mm right renal lesion, small enhancing mass versus hemorrhagic cyst. Attention on  future scans is suggested. 4. Atherosclerotic calcifications involving the thoracic and abdominal aorta and branch vessels but no focal aneurysm. 5. Moderate stool throughout the colon and down into the rectum may suggest constipation.     05/06/2016 Surgery    Robotic hysterectomy and staging. IB USC, 0/11 nodes. Dispositioned to chemotherapy with paclitaxel and carboplatin x 6 with vaginal brachytherapy.    05/06/2016 Pathology Results    1. Lymph node, biopsy, right peri-aortic - ONE OF ONE LYMPH NODES NEGATIVE FOR MALIGNANCY (0/1). 2. Lymph node, biopsy, left peri-aortic - ONE OF ONE LYMPH NODES NEGATIVE FOR MALIGNANCY (0/1). 3. Lymph node, biopsy, right pelvic - FOUR OF FOUR LYMPH NODES NEGATIVE FOR MALIGNANCY (0/4). 4. Lymph node, biopsy, left pelvic - FIVE OF FIVE LYMPH NODES NEGATIVE FOR MALIGNANCY (0/5). 5. Uterus +/- tubes/ovaries, neoplastic - UTERUS: -ENDO/MYOMETRIUM: INVASIVE MIXED ENDOMETRIOID AND SEROUS CARCINOMA, SPANNING 4 CM. TUMOR INVADES OUTER HALF OF MYOMETRIUM. LYMPHOVASCULAR INVASION PRESENT. SEE ONCOLOGY TABLE. LEIOMYOMA. -SEROSA: UNINVOLVED. NO MALIGNANCY. - CERVIX: BENIGN SQUAMOUS AND ENDOCERVICAL MUCOSA. NO DYSPLASIA OR MALIGNANCY. - BILATERAL OVARIES: INCLUSION CYSTS. NO MALIGNANCY. - BILATERAL FALLOPIAN TUBES: UNREMARKABLE. NO MALIGNANCY.  Specimen: Uterus, cervix, bilateral ovaries and fallopian tubes, bilateral pelvic and para-aortic lymph nodes. Procedure: Hysterectomy with bilateral salpingo-oophorectomy. Lymph node sampling performed: Bilateral pelvic and para-aortic lymph node biopsies Specimen integrity: Intact. 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    05/16/2016 Imaging    Ct abdomen: Extensive subcutaneous emphysema about the abdomen and pelvis, likely postoperative. Probable abdominal pelvic wall small volume hematomas, as above. 2. Right pelvic sidewall fluid collection is likely a seroma  or lymphangioma. No explanation for left lower extremity pain. 3. Minimal ill-defined fluid in the presacral space and left adnexa. 4. Small hiatal hernia. 5. An incidental finding of potential clinical significance has been found. Indeterminate right renal lesion is similar to on the recent exam. Consider further evaluation with dedicated outpatient pre and post contrast abdominal MRI    06/11/2016 - 08/22/2016 Chemotherapy    The patient completed only three cycles due to toxicity from paclitaxel and carboplatin.     07/29/2016 Imaging    CT angiogram chest: No demonstrable pulmonary embolus. Multiple foci of atherosclerotic calcification in the aorta as well as foci of coronary artery calcification.  No edema or consolidation. No lung mass or nodule lesion. No adenopathy. Gallbladder absent.  Stable mild biliary duct prominence. Stable nodular opacity right lobe of thyroid which does not meet consensus guidelines criteria for further assessment    08/06/2016 - 09/03/2016 Radiation Therapy    HDR vaginal cuff brachytherapy x 5 fractions.    08/11/2016 Procedure    Ultrasound and fluoroscopically guided right internal jugular single lumen power port catheter insertion. Tip in the SVC/RA junction. Catheter ready for use.    09/23/2016 Imaging    Ct abdomen: No findings suspicious for metastatic disease in the abdomen or pelvis. 2. Thin-walled lobulated 6.9 x 5.8 x 5.8 cm right pelvic sidewall fluid collection, increased in size since 05/16/2016 CT study, favor a postoperative lymphocele, which demonstrates extrinsic mass-effect on the right bladder wall. 3. Indeterminate 1.2 cm posterior interpolar right renal cortical lesion, for which 4 month stability has been demonstrated, renal neoplasm not excluded. Recommend either dedicated renal protocol MRI or CT abdomen without and with IV contrast or continued attention on follow-up surveillance CT studies, as clinically warranted. 4. Additional findings  include aortic atherosclerosis, moderate hiatal hernia, moderate sigmoid diverticulosis, tiny fat containing umbilical hernia, and degenerative disc disease, facet arthropathy and spondylolisthesis in the lower lumbar spine    09/30/2016 Procedure    She had successful CT-guided aspiration of right pelvic fluid collection. Approximately 90 mL yellow serous fluid was aspirated. Samples were sent for culture and cytology.    11/19/2016 Imaging    CT abdomen: No CT findings to suggest recurrent tumor, lymphadenopathy or metastatic disease. 2. Stable right-sided pelvic sidewall cyst with mass effect on the bladder. 3. Stable advanced atherosclerotic calcifications involving the aorta and iliac arteries. 4. Moderate size hiatal hernia. 5. Status post cholecystectomy with stable intra and extrahepatic biliary dilatation    12/29/2016 Pathology Results    SOFT TISSUE, FINE NEEDLE ASPIRATION, RIGHT ADNEXAL CYST (SPECIMEN 1 OF 1 COLLECTED 09-30-2016) NO MALIGNANT CELLS IDENTIFIED.    02/17/2017 Imaging    CT abdomen 1. The previously noted thin walled cystic lesion in the right pelvis has undergone change in appearance ; it is now thick-walled and rim enhancing. There is associated inflammation and edema surrounding the pelvic sidewall lesion which has also increased in size. This suggests interim inflammation/infection of the cystic collection. 2. Urinary bladder displaced to the left by the thick-walled right pelvic sidewall cystic lesion. Wall thickening of the bladder is likely reactive but could also be due to a cystitis. Suggest correlation with urinalysis. 3. Stable intra and extrahepatic biliary dilatation post cholecystectomy 4. Stable 13 mm intermediate density right renal lesion 5. Diffuse diverticular disease of the colon without acute inflammation    02/17/2017 - 02/20/2017 Hospital Admission    She presented to the emergency department due to lower abdominal pain, diarrhea for 4 days associated with  weakness, hypersomnolence following an episode of a gastroenteritis about a week ago after returning from the beach. She was told to decrease her Cymbalta to decrease somnolence. CT Abd/Pelvis was done which revealed that the previously seen thinwall cystic lesions in the right pelvis is now take wall and ring-enhancing with associated inflammation and edema surrounding the pelvic sidewall lesions which also has increased in size. This suggests inter-inflammation/infection of the cystic collection. The urinary bladder is displaced to the left by these lesions. General Surgery, IR, and GYN were consulted.She underwent IR Drain Right Pelvic Drain placement on 5/23 and is improving. Drain Fluid Cx grew out some Streptococcus Viridans and Cytology is negative. Her symptoms resolved with antibiotics     02/18/2017 Procedure    Successful CT guided placement of a 10 French all purpose drain catheter into the residual/recurrent right pelvic sidewall fluid collection with aspiration of 50 mL of purulent fluid. Samples were sent to the laboratory for both culture and cytologic analysis.    02/25/2017 Imaging    Significant improvement in the bilobed right pelvic sidewall abscess following percutaneous drain. Stable drain catheter position. Small residual abscess is noted, measurements as above. No new abscess. Otherwise stable CT of the abdomen pelvis with contrast.    03/11/2017 Imaging    Ct abdomen: Resolution of right pelvic infected fluid collection after percutaneous drainage. No further fluid is seen around the percutaneous drain and acute inflammatory changes also have nearly resolved    12/03/2017 Imaging    No abnormality identified within the pelvis.    03/23/2018 Procedure    Technically successful tunneled Port catheter removal.    06/11/2018 Imaging    There are several findings most likely related to metastatic malignancy described below:  Multiple bilateral  pulmonary nodules and masses, some of  which are cavitary.  Abnormal mediastinal adenopathy.  Multiple liver lesions.  Irregular soft tissue mass in the right side of the pelvis resulting in right ureteral obstruction.  Central mesentery mass in the abdomen extending to the ascending and transverse colon causing wall thickening.  2.4 cm lobulated mass in the distal sigmoid colon    06/14/2018 Tumor Marker    Patient's tumor was tested for the following markers: CA-125 Results of the tumor marker test revealed 96.6     Genetic Testing    Patient has genetic testing done for MSI on Surgical Patholgy from 05/06/2016. Results revealed patient has the following: MSI: stable.     REVIEW OF SYSTEMS:   Constitutional: Denies fevers, chills or abnormal weight loss Eyes: Denies blurriness of vision Ears, nose, mouth, throat, and face: Denies mucositis or sore throat Respiratory: Denies cough, dyspnea or wheezes Cardiovascular: Denies palpitation, chest discomfort or lower extremity swelling Skin: Denies abnormal skin rashes Lymphatics: Denies new lymphadenopathy or easy bruising Neurological:Denies numbness, tingling or new weaknesses Behavioral/Psych: Mood is stable, no new changes  All other systems were reviewed with the patient and are negative.  I have reviewed the past medical history, past surgical history, social history and family history with the patient and they are unchanged from previous note.  ALLERGIES:  is allergic to levofloxacin; promethazine hcl; sulfa antibiotics; sulfa drugs cross reactors; and sulfur.  MEDICATIONS:  Current Outpatient Medications  Medication Sig Dispense Refill  . cholecalciferol (VITAMIN D) 1000 units tablet Take 1,000 Units by mouth daily.     [name] dexamethasone (DECADRON) 4 MG tablet Take 1 tablet (4 mg total) by mouth daily. 30 tablet 0  . HYDROmorphone (DILAUDID) 2 MG tablet Take 1 tablet (2 mg total) by mouth every 4 (four) hours as needed for severe pain. 30 tablet 0  .  levothyroxine (SYNTHROID, LEVOTHROID) 88 MCG tablet TAKE ONE TABLET BY MOUTH ONCE DAILY BEFORE  BREAKFAST. (Patient taking differently: Take 88 mcg by mouth daily before breakfast. ) 90 tablet 3  . lidocaine-prilocaine (EMLA) cream Apply 1 application topically as needed. 30 g 6  . Melatonin 5 MG CAPS Take 5 mg by mouth at bedtime.     . metoCLOPramide (REGLAN) 10 MG tablet Take 1 tablet (10 mg total) by mouth every 8 (eight) hours as needed for nausea. (Patient taking differently: Take 10 mg by mouth 3 (three) times daily before meals. ) 30 tablet 1  . Multiple Vitamins-Minerals (MULTIVITAMIN WITH MINERALS) tablet Take 1 tablet by mouth daily.    . ondansetron (ZOFRAN) 8 MG tablet Take 1 tablet (8 mg total) by mouth every 8 (eight) hours as needed for nausea. 30 tablet 3  . Polyethyl Glycol-Propyl Glycol (SYSTANE ULTRA) 0.4-0.3 % SOLN Place 1 drop into both eyes daily as needed (for dry eyes).    . polyethylene glycol (MIRALAX / GLYCOLAX) packet Take 17 g by mouth daily.      No current facility-administered medications for this visit.    Facility-Administered Medications Ordered in Other Visits  Medication Dose Route Frequency Provider Last Rate Last Dose  . influenza  inactive virus vaccine (FLUZONE/FLUARIX) injection 0.5 mL  0.5 mL Intramuscular Once Rita Ohara, MD        PHYSICAL EXAMINATION: ECOG PERFORMANCE STATUS: 1 - Symptomatic but completely ambulatory  Vitals:   06/21/18 1309  BP: (!) 147/63  Pulse: 92  Resp: 18  Temp: 97.9 F (36.6 C)  SpO2: 98%   Filed  Weights   06/21/18 1309  Weight: 189 lb 14.4 oz (86.1 kg)    GENERAL:alert, no distress and comfortable SKIN: skin color, texture, turgor are normal, no rashes or significant lesions EYES: normal, Conjunctiva are pink and non-injected, sclera clear OROPHARYNX:no exudate, no erythema and lips, buccal mucosa, and tongue normal  NECK: supple, thyroid normal size, non-tender, without nodularity LYMPH:  no palpable  lymphadenopathy in the cervical, axillary or inguinal LUNGS: clear to auscultation and percussion with normal breathing effort HEART: regular rate & rhythm and no murmurs and no lower extremity edema ABDOMEN:abdomen soft, mild discomfort on palpation without rebound no guarding Musculoskeletal:no cyanosis of digits and no clubbing  NEURO: alert & oriented x 3 with fluent speech, no focal motor/sensory deficits  LABORATORY DATA:  I have reviewed the data as listed    Component Value Date/Time   NA 141 06/14/2018 1323   NA 142 06/10/2018 1118   NA 142 03/16/2017 0852   K 4.0 06/14/2018 1323   K 4.2 03/16/2017 0852   CL 104 06/14/2018 1323   CO2 26 06/14/2018 1323   CO2 28 03/16/2017 0852   GLUCOSE 97 06/14/2018 1323   GLUCOSE 93 03/16/2017 0852   BUN 15 06/14/2018 1323   BUN 20 06/10/2018 1118   BUN 9.9 03/16/2017 0852   CREATININE 1.34 (H) 06/14/2018 1323   CREATININE 1.35 (H) 05/06/2018 1215   CREATININE 0.86 06/25/2017 1119   CREATININE 0.8 03/16/2017 0852   CALCIUM 9.4 06/14/2018 1323   CALCIUM 9.2 03/16/2017 0852   PROT 7.2 06/14/2018 1323   PROT 6.7 06/10/2018 1118   PROT 7.1 03/16/2017 0852   ALBUMIN 3.6 06/14/2018 1323   ALBUMIN 4.4 06/10/2018 1118   ALBUMIN 3.1 (L) 03/16/2017 0852   AST 16 06/14/2018 1323   AST 16 05/06/2018 1215   AST 19 03/16/2017 0852   ALT <6 06/14/2018 1323   ALT <6 05/06/2018 1215   ALT 9 03/16/2017 0852   ALKPHOS 73 06/14/2018 1323   ALKPHOS 64 03/16/2017 0852   BILITOT 0.3 06/14/2018 1323   BILITOT <0.2 06/10/2018 1118   BILITOT 0.3 05/06/2018 1215   BILITOT 0.27 03/16/2017 0852   GFRNONAA 36 (L) 06/14/2018 1323   GFRNONAA 36 (L) 05/06/2018 1215   GFRAA 42 (L) 06/14/2018 1323   GFRAA 41 (L) 05/06/2018 1215    No results found for: SPEP, UPEP  Lab Results  Component Value Date   WBC 5.2 06/14/2018   NEUTROABS 3.1 06/14/2018   HGB 10.6 (L) 06/14/2018   HCT 33.4 (L) 06/14/2018   MCV 85.9 06/14/2018   PLT 311 06/14/2018       Chemistry      Component Value Date/Time   NA 141 06/14/2018 1323   NA 142 06/10/2018 1118   NA 142 03/16/2017 0852   K 4.0 06/14/2018 1323   K 4.2 03/16/2017 0852   CL 104 06/14/2018 1323   CO2 26 06/14/2018 1323   CO2 28 03/16/2017 0852   BUN 15 06/14/2018 1323   BUN 20 06/10/2018 1118   BUN 9.9 03/16/2017 0852   CREATININE 1.34 (H) 06/14/2018 1323   CREATININE 1.35 (H) 05/06/2018 1215   CREATININE 0.86 06/25/2017 1119   CREATININE 0.8 03/16/2017 0852      Component Value Date/Time   CALCIUM 9.4 06/14/2018 1323   CALCIUM 9.2 03/16/2017 0852   ALKPHOS 73 06/14/2018 1323   ALKPHOS 64 03/16/2017 0852   AST 16 06/14/2018 1323   AST 16 05/06/2018 1215   AST  19 03/16/2017 0852   ALT <6 06/14/2018 1323   ALT <6 05/06/2018 1215   ALT 9 03/16/2017 0852   BILITOT 0.3 06/14/2018 1323   BILITOT <0.2 06/10/2018 1118   BILITOT 0.3 05/06/2018 1215   BILITOT 0.27 03/16/2017 0852       RADIOGRAPHIC STUDIES: I have personally reviewed the radiological images as listed and agreed with the findings in the report. Ct Abdomen Pelvis Wo Contrast  Result Date: 06/11/2018 CLINICAL DATA:  Endometrial cancer EXAM: CT CHEST, ABDOMEN AND PELVIS WITHOUT CONTRAST TECHNIQUE: Multidetector CT imaging of the chest, abdomen and pelvis was performed following the standard protocol without IV contrast. COMPARISON:  03/11/2017, 02/25/2017, 07/29/2016 FINDINGS: CT CHEST FINDINGS Cardiovascular: No evidence of aortic aneurysm. Atherosclerotic calcifications of the thoracic aorta are noted. Left main coronary artery calcification. Mediastinum/Nodes: Abnormal mediastinal adenopathy. 1.6 cm right paratracheal node on image 14. 1.5 cm pretracheal node on image 21. 1.8 cm precarinal node on image 28. No pericardial effusion. Small right thyroid hypodensity is stable. Moderate-sized hiatal hernia. Lungs/Pleura: No pneumothorax. Small bilateral pleural effusions. Bilateral lung masses and nodules. 2.1 cm lobulated  right upper lobe nodule on image 46. Anterior ground-glass opacity in the right upper lobe on image 46. Subcentimeter lobulated right upper lobe nodule on image 72. 0.6 cm cavitary nodule in right lower lobe on image 76. Posterior pleural based nodule in the right lower lobe measures 0.8 cm on image 90. 7.3 cm right lower lobe posterior lung mass on image 111. Spiculated left upper lobe nodule measures 1 cm on image 38. 0.6 cm left upper lobe nodule on image 48. 1.5 cm cavitary left upper lobe nodule on image 63. Sub solid nodule in the superior segment of the left lower lobe on image 65. Spiculated left upper lobe pulmonary nodule measures 1 cm on image 71. Musculoskeletal: No vertebral compression deformity. No destructive bone lesion. Midthoracic vertebral hemangioma is noted. CT ABDOMEN PELVIS FINDINGS Hepatobiliary: 0.8 cm right lobe liver lesion on image 56. Anterior right lobe liver lesion on image 63 measures 2.5 cm. 1.2 cm inferior right lobe liver lesion on image 69 other smaller right lobe liver lesions are suspected. Postcholecystectomy. Pancreas: Unremarkable Spleen: Unremarkable Adrenals/Urinary Tract: Severe right hydronephrosis is associated with right renal cortical atrophy. Left kidney and adrenal glands are unremarkable. Right hydroureter these to a large right pelvic irregular soft tissue mass measuring 4.7 x 3.4 cm. It extends to the right pelvic sidewall and is inseparable from the right side of the bladder. There is associated wall thickening in the right side of the bladder. This is in the location of the patient's previous abscess. Stomach/Bowel: Prominent stool burden in the descending and sigmoid colon. There is wall thickening in the ascending colon and transverse colon associated with an ill-defined mass within the mesentery measuring 2.8 x 11.5 cm. There is a lobulated 2.4 cm soft tissue mass along the distal sigmoid colon on image 99. There is no evidence of small-bowel obstruction.  Vascular/Lymphatic: Atherosclerotic calcifications of the aorta and iliac arteries. Small retroperitoneal lymph nodes along the aorta. Reproductive: Uterus is absent. Other: Small amount of free fluid layers in the pelvis. Musculoskeletal: No destructive bone lesion. No vertebral compression deformity. IMPRESSION: There are several findings most likely related to metastatic malignancy described below: Multiple bilateral pulmonary nodules and masses, some of which are cavitary. Abnormal mediastinal adenopathy. Multiple liver lesions. Irregular soft tissue mass in the right side of the pelvis resulting in right ureteral obstruction. Central mesentery mass in the  abdomen extending to the ascending and transverse colon causing wall thickening. 2.4 cm lobulated mass in the distal sigmoid colon. Electronically Signed   By: Marybelle Killings M.D.   On: 06/11/2018 18:23   Dg Chest 2 View  Result Date: 06/10/2018 CLINICAL DATA:  Fatigue for 6 months. History of endometrial carcinoma. EXAM: CHEST - 2 VIEW COMPARISON:  PA and lateral chest 02/17/2017. FINDINGS: The patient has a pleural based mass in the right lower lobe measuring 7.4 cm craniocaudal by 5.4 cm AP by 6.9 cm transverse. A new nodule in the right upper lobe measures 2.1 cm craniocaudal. There is also a new nodule in the left upper lobe measuring 1.3 cm transverse. No consolidative process, pneumothorax or effusion. Aortic atherosclerosis is noted. Heart size is normal. No acute or focal bony abnormality. IMPRESSION: New large with pleural based mass in the right lower lobe and nodules in both the right and left lung are worrisome for metastatic disease in this patient with a history of endometrial carcinoma. Recommend CT chest, abdomen and pelvis with contrast for further evaluation. Electronically Signed   By: Inge Rise M.D.   On: 06/10/2018 12:09   Ct Chest Wo Contrast  Result Date: 06/11/2018 CLINICAL DATA:  Endometrial cancer EXAM: CT CHEST, ABDOMEN  AND PELVIS WITHOUT CONTRAST TECHNIQUE: Multidetector CT imaging of the chest, abdomen and pelvis was performed following the standard protocol without IV contrast. COMPARISON:  03/11/2017, 02/25/2017, 07/29/2016 FINDINGS: CT CHEST FINDINGS Cardiovascular: No evidence of aortic aneurysm. Atherosclerotic calcifications of the thoracic aorta are noted. Left main coronary artery calcification. Mediastinum/Nodes: Abnormal mediastinal adenopathy. 1.6 cm right paratracheal node on image 14. 1.5 cm pretracheal node on image 21. 1.8 cm precarinal node on image 28. No pericardial effusion. Small right thyroid hypodensity is stable. Moderate-sized hiatal hernia. Lungs/Pleura: No pneumothorax. Small bilateral pleural effusions. Bilateral lung masses and nodules. 2.1 cm lobulated right upper lobe nodule on image 46. Anterior ground-glass opacity in the right upper lobe on image 46. Subcentimeter lobulated right upper lobe nodule on image 72. 0.6 cm cavitary nodule in right lower lobe on image 76. Posterior pleural based nodule in the right lower lobe measures 0.8 cm on image 90. 7.3 cm right lower lobe posterior lung mass on image 111. Spiculated left upper lobe nodule measures 1 cm on image 38. 0.6 cm left upper lobe nodule on image 48. 1.5 cm cavitary left upper lobe nodule on image 63. Sub solid nodule in the superior segment of the left lower lobe on image 65. Spiculated left upper lobe pulmonary nodule measures 1 cm on image 71. Musculoskeletal: No vertebral compression deformity. No destructive bone lesion. Midthoracic vertebral hemangioma is noted. CT ABDOMEN PELVIS FINDINGS Hepatobiliary: 0.8 cm right lobe liver lesion on image 56. Anterior right lobe liver lesion on image 63 measures 2.5 cm. 1.2 cm inferior right lobe liver lesion on image 69 other smaller right lobe liver lesions are suspected. Postcholecystectomy. Pancreas: Unremarkable Spleen: Unremarkable Adrenals/Urinary Tract: Severe right hydronephrosis is  associated with right renal cortical atrophy. Left kidney and adrenal glands are unremarkable. Right hydroureter these to a large right pelvic irregular soft tissue mass measuring 4.7 x 3.4 cm. It extends to the right pelvic sidewall and is inseparable from the right side of the bladder. There is associated wall thickening in the right side of the bladder. This is in the location of the patient's previous abscess. Stomach/Bowel: Prominent stool burden in the descending and sigmoid colon. There is wall thickening in the ascending colon  and transverse colon associated with an ill-defined mass within the mesentery measuring 2.8 x 11.5 cm. There is a lobulated 2.4 cm soft tissue mass along the distal sigmoid colon on image 99. There is no evidence of small-bowel obstruction. Vascular/Lymphatic: Atherosclerotic calcifications of the aorta and iliac arteries. Small retroperitoneal lymph nodes along the aorta. Reproductive: Uterus is absent. Other: Small amount of free fluid layers in the pelvis. Musculoskeletal: No destructive bone lesion. No vertebral compression deformity. IMPRESSION: There are several findings most likely related to metastatic malignancy described below: Multiple bilateral pulmonary nodules and masses, some of which are cavitary. Abnormal mediastinal adenopathy. Multiple liver lesions. Irregular soft tissue mass in the right side of the pelvis resulting in right ureteral obstruction. Central mesentery mass in the abdomen extending to the ascending and transverse colon causing wall thickening. 2.4 cm lobulated mass in the distal sigmoid colon. Electronically Signed   By: Marybelle Killings M.D.   On: 06/11/2018 18:23    All questions were answered. The patient knows to call the clinic with any problems, questions or concerns. No barriers to learning was detected.  I spent 30 minutes counseling the patient face to face. The total time spent in the appointment was 40 minutes and more than 50% was on  counseling and review of test results  [name], MD 06/22/2018 7:01 AM

## 2018-06-24 ENCOUNTER — Other Ambulatory Visit: Payer: Self-pay | Admitting: Radiology

## 2018-06-25 ENCOUNTER — Other Ambulatory Visit: Payer: Self-pay

## 2018-06-25 ENCOUNTER — Ambulatory Visit (HOSPITAL_COMMUNITY)
Admission: RE | Admit: 2018-06-25 | Discharge: 2018-06-25 | Disposition: A | Discharge status: Home / Self Care | Payer: Medicare Other | Source: Ambulatory Visit | Attending: Hematology and Oncology | Admitting: Hematology and Oncology

## 2018-06-25 ENCOUNTER — Encounter (HOSPITAL_COMMUNITY): Payer: Self-pay | Admitting: Interventional Radiology

## 2018-06-25 DIAGNOSIS — E669 Obesity, unspecified: Secondary | ICD-10-CM | POA: Insufficient documentation

## 2018-06-25 DIAGNOSIS — I700 Atherosclerosis of aorta: Secondary | ICD-10-CM | POA: Insufficient documentation

## 2018-06-25 DIAGNOSIS — M5136 Other intervertebral disc degeneration, lumbar region: Secondary | ICD-10-CM | POA: Insufficient documentation

## 2018-06-25 DIAGNOSIS — C541 Malignant neoplasm of endometrium: Secondary | ICD-10-CM | POA: Insufficient documentation

## 2018-06-25 DIAGNOSIS — K219 Gastro-esophageal reflux disease without esophagitis: Secondary | ICD-10-CM | POA: Insufficient documentation

## 2018-06-25 DIAGNOSIS — Z882 Allergy status to sulfonamides status: Secondary | ICD-10-CM | POA: Insufficient documentation

## 2018-06-25 DIAGNOSIS — Z6829 Body mass index (BMI) 29.0-29.9, adult: Secondary | ICD-10-CM | POA: Diagnosis not present

## 2018-06-25 DIAGNOSIS — Z87891 Personal history of nicotine dependence: Secondary | ICD-10-CM | POA: Insufficient documentation

## 2018-06-25 DIAGNOSIS — G629 Polyneuropathy, unspecified: Secondary | ICD-10-CM | POA: Diagnosis not present

## 2018-06-25 HISTORY — PX: IR IMAGING GUIDED PORT INSERTION: IMG5740

## 2018-06-25 LAB — CBC
HCT: 32.6 % — ABNORMAL LOW (ref 36.0–46.0)
Hemoglobin: 10.1 g/dL — ABNORMAL LOW (ref 12.0–15.0)
MCH: 27.2 pg (ref 26.0–34.0)
MCHC: 31 g/dL (ref 30.0–36.0)
MCV: 87.6 fL (ref 78.0–100.0)
Platelets: 243 10*3/uL (ref 150–400)
RBC: 3.72 MIL/uL — AB (ref 3.87–5.11)
RDW: 14.3 % (ref 11.5–15.5)
WBC: 5.6 10*3/uL (ref 4.0–10.5)

## 2018-06-25 LAB — BASIC METABOLIC PANEL
Anion gap: 11 (ref 5–15)
BUN: 24 mg/dL — ABNORMAL HIGH (ref 8–23)
CALCIUM: 8.3 mg/dL — AB (ref 8.9–10.3)
CO2: 24 mmol/L (ref 22–32)
Chloride: 103 mmol/L (ref 98–111)
Creatinine, Ser: 1.37 mg/dL — ABNORMAL HIGH (ref 0.44–1.00)
GFR, EST AFRICAN AMERICAN: 41 mL/min — AB (ref >60)
GFR, EST NON AFRICAN AMERICAN: 35 mL/min — AB (ref >60)
Glucose, Bld: 92 mg/dL (ref 70–99)
Potassium: 3.6 mmol/L (ref 3.5–5.1)
SODIUM: 138 mmol/L (ref 135–145)

## 2018-06-25 LAB — PROTIME-INR
INR: 1.02
PROTHROMBIN TIME: 13.3 s (ref 11.4–15.2)

## 2018-06-25 MED ORDER — CEFAZOLIN SODIUM-DEXTROSE 2-4 GM/100ML-% IV SOLN
INTRAVENOUS | Status: AC
  Filled 2018-06-25: qty 100

## 2018-06-25 MED ORDER — FENTANYL CITRATE (PF) 100 MCG/2ML IJ SOLN
INTRAMUSCULAR | Status: AC
  Filled 2018-06-25: qty 4

## 2018-06-25 MED ORDER — FENTANYL CITRATE (PF) 100 MCG/2ML IJ SOLN
INTRAMUSCULAR | Status: AC | PRN
  Administered 2018-06-25: 25 ug via INTRAVENOUS
  Administered 2018-06-25: 50 ug via INTRAVENOUS

## 2018-06-25 MED ORDER — MIDAZOLAM HCL 2 MG/2ML IJ SOLN
INTRAMUSCULAR | Status: AC | PRN
  Administered 2018-06-25: 1 mg via INTRAVENOUS
  Administered 2018-06-25: 0.5 mg via INTRAVENOUS

## 2018-06-25 MED ORDER — HEPARIN SOD (PORK) LOCK FLUSH 100 UNIT/ML IV SOLN
INTRAVENOUS | Status: AC
  Administered 2018-06-25: 500 [IU]
  Filled 2018-06-25: qty 5

## 2018-06-25 MED ORDER — MIDAZOLAM HCL 2 MG/2ML IJ SOLN
INTRAMUSCULAR | Status: AC
  Filled 2018-06-25: qty 4

## 2018-06-25 MED ORDER — LIDOCAINE HCL 1 % IJ SOLN
INTRAMUSCULAR | Status: AC
  Filled 2018-06-25: qty 20

## 2018-06-25 MED ORDER — CEFAZOLIN SODIUM-DEXTROSE 2-4 GM/100ML-% IV SOLN
2.0000 g | INTRAVENOUS | Status: AC
  Administered 2018-06-25: 2 g via INTRAVENOUS

## 2018-06-25 MED ORDER — SODIUM CHLORIDE 0.9 % IV SOLN
INTRAVENOUS | Status: DC
  Administered 2018-06-25: 10:00:00 via INTRAVENOUS

## 2018-06-25 MED ORDER — LIDOCAINE HCL (PF) 1 % IJ SOLN
INTRAMUSCULAR | Status: AC | PRN
  Administered 2018-06-25: 15 mL

## 2018-06-25 NOTE — H&P (Signed)
Chief Complaint: Patient was seen in consultation today for endometrial cancer  Referring Physician(s): Gorsuch,Ni  Supervising Physician: Marybelle Killings  Patient Status: [facility] - Out-pt  History of Present Illness: Kayla Guerrero is a 83 y.o. female with past medical history of arthritis, DDD, GERD, and endometrial cancer who underwent Port-A-Cath placement with Dr. Annamaria Boots in 2017.  Her Port-A-Cath remained in place through the completion of therapy and was removed 03/23/18 due to no further signs of disease.  Unfortunately, she developed progressive fatigue which prompted further investigation and was found to have widespread metastatic disease.   CT Chest/Abdomen/Pelvis 06/11/18 There are several findings most likely related to metastatic malignancy described below: Multiple bilateral pulmonary nodules and masses, some of which are cavitary. Abnormal mediastinal adenopathy. Multiple liver lesions. Irregular soft tissue mass in the right side of the pelvis resulting in right ureteral obstruction. Central mesentery mass in the abdomen extending to the ascending and transverse colon causing wall thickening. 2.4 cm lobulated mass in the distal sigmoid colon.  She now has plans to resume chemotherapy and is in need of durable venous access.  IR consulted for Port-A-Cath placement at the request of Dr. Alvy Bimler.  She has been NPO.  She does not take blood thinners.   Past Medical History:  Diagnosis Date  . Aortic atherosclerosis ([place])   . Arthritis   . Arthritis of knee, left   . Borderline osteopenia DEXA 2006 and 2012  . Cancer [facility])    endometrial  . Cataract   . Cholesterol serum elevated   . Colon polyps   . DDD (degenerative disc disease), lumbar   . DDD (degenerative disc disease), lumbar   . Diverticulitis of sigmoid colon    Moderate  . Facet arthropathy, lumbar   . FHx: colon cancer   . GERD (gastroesophageal reflux disease)   . History of hiatal hernia    Moderate  . History of radiation therapy 08/06/16 - 09/03/16   vaginal cuff treated to 30 Gy in 5 fractions  . Hypothyroidism   . Kidney lesion, native, right   . Left foot drop   . Leukopenia   . Neuropathy    chemo indiced ; in right hand and bilateral feet   . Obesity   . Pelvic abscess in female 01/2017   Right  . Spondylisthesis   . Umbilical hernia     Past Surgical History:  Procedure Laterality Date  . CATARACT EXTRACTION, BILATERAL    . CHOLECYSTECTOMY    . COLONOSCOPY  2010, 04/2013   Buccini  . IR GENERIC HISTORICAL  08/11/2016   IR FLUORO GUIDE PORT INSERTION RIGHT 08/11/2016 Greggory Keen, MD [facility]-INTERV RAD  . IR GENERIC HISTORICAL  08/11/2016   IR US GUIDE VASC ACCESS RIGHT 08/11/2016 Greggory Keen, MD [facility]-INTERV RAD  . IR RADIOLOGIST EVAL & MGMT  02/25/2017  . IR RADIOLOGIST EVAL & MGMT  03/11/2017  . IR REMOVAL TUN ACCESS W/ PORT W/O FL MOD SED  03/23/2018  . PTERYGIUM EXCISION    . ROBOTIC ASSISTED TOTAL HYSTERECTOMY WITH BILATERAL SALPINGO OOPHERECTOMY N/A 05/06/2016   Procedure: XI ROBOTIC ASSISTED  LAPAROSCOPIC TOTAL HYSTERECTOMY WITH BILATERAL SALPINGO OOPHORECTOMY;  Surgeon: Nancy Marus, MD;  Location: [facility] ORS;  Service: Gynecology;  Laterality: N/A;  . SENTINEL NODE BIOPSY N/A 05/06/2016   Procedure: SENTINEL NODE BIOPSY;  Surgeon: Nancy Marus, MD;  Location: [facility] ORS;  Service: Gynecology;  Laterality: N/A;  . TOTAL KNEE ARTHROPLASTY Left 04/12/2018   Procedure: LEFT TOTAL KNEE ARTHROPLASTY;  Surgeon: Gaynelle Arabian, MD;  Location: [facility] ORS;  Service: Orthopedics;  Laterality: Left;  . TUBAL LIGATION      Allergies: Levofloxacin; Promethazine hcl; Sulfa antibiotics; Sulfa drugs cross reactors; and Sulfur  Medications: Prior to Admission medications   Medication Sig Start Date End Date Taking? Authorizing Provider  cholecalciferol (VITAMIN D) 1000 units tablet Take 1,000 Units by mouth daily.    Yes [provider]  dexamethasone (DECADRON) 4 MG tablet  Take 1 tablet (4 mg total) by mouth daily. 06/14/18  Yes Gorsuch, Ni, MD  HYDROmorphone (DILAUDID) 2 MG tablet Take 1 tablet (2 mg total) by mouth every 4 (four) hours as needed for severe pain. 06/14/18  Yes [name], MD  levothyroxine (SYNTHROID, LEVOTHROID) 88 MCG tablet TAKE ONE TABLET BY MOUTH ONCE DAILY BEFORE  BREAKFAST. Patient taking differently: Take 88 mcg by mouth daily before breakfast.  06/11/17  Yes Rita Ohara, MD  lidocaine-prilocaine (EMLA) cream Apply 1 application topically as needed. 06/21/18  Yes Gorsuch, Ni, MD  Melatonin 5 MG CAPS Take 5 mg by mouth at bedtime.    Yes [provider]  metoCLOPramide (REGLAN) 10 MG tablet Take 1 tablet (10 mg total) by mouth every 8 (eight) hours as needed for nausea. Patient taking differently: Take 10 mg by mouth 3 (three) times daily before meals.  06/14/18  Yes [name], MD  Multiple Vitamins-Minerals (MULTIVITAMIN WITH MINERALS) tablet Take 1 tablet by mouth daily.   Yes [provider]  ondansetron (ZOFRAN) 8 MG tablet Take 1 tablet (8 mg total) by mouth every 8 (eight) hours as needed for nausea. 06/14/18  Yes Gorsuch, Ernst Spell, MD  Polyethyl Glycol-Propyl Glycol (SYSTANE ULTRA) 0.4-0.3 % SOLN Place 1 drop into both eyes daily as needed (for dry eyes).   Yes [provider]  polyethylene glycol (MIRALAX / GLYCOLAX) packet Take 17 g by mouth daily.    Yes [provider]     Family History  Problem Relation Age of Onset  . Cancer Mother 7       COLON   . Heart disease Mother   . Cancer Maternal Aunt   . Breast cancer Maternal Aunt   . Mental illness Maternal Uncle   . Cancer Maternal Grandmother        stomach? more likely uterine  . Macular degeneration Maternal Grandmother   . Diabetes Neg Hx     Social History   Socioeconomic History  . Marital status: Married    Spouse name: Jeneen Rinks  . Number of children: 1  . Years of education: MFA  . Highest education level: Not on file  Occupational  History  . Occupation: retired Librarian, academic); Artist  Social Needs  . Financial resource strain: Not on file  . Food insecurity:    Worry: Not on file    Inability: Not on file  . Transportation needs:    Medical: Not on file    Non-medical: Not on file  Tobacco Use  . Smoking status: Former Smoker    Packs/day: 1.00    Types: Cigarettes    Last attempt to quit: 09/29/1980    Years since quitting: 37.7  . Smokeless tobacco: Never Used  Substance and Sexual Activity  . Alcohol use: Yes    Alcohol/week: 0.0 standard drinks    Comment: 1 glass of wine 3-4 times/week; hard liquor (highball) or a beer once a week  . Drug use: No  . Sexual activity: Yes    Partners: Male  Lifestyle  .  Physical activity:    Days per week: Not on file    Minutes per session: Not on file  . Stress: Not on file  Relationships  . Social connections:    Talks on phone: Not on file    Gets together: Not on file    Attends religious service: Not on file    Active member of club or organization: Not on file    Attends meetings of clubs or organizations: Not on file    Relationship status: Not on file  Other Topics Concern  . Not on file  Social History Narrative   Lives with husband.  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 Son lives in [place]; granddaughter (transgender--now grandson) lives in [place] (with her Verlee Rossetti is divorced)   [place] into [place] [facility] 2019.    Review of Systems: A 12 point ROS discussed and pertinent positives are indicated in the HPI above.  All other systems are negative.  Review of Systems  Constitutional: Positive for fatigue. Negative for fever.  Respiratory: Negative for cough and shortness of breath.   Cardiovascular: Negative for chest pain.  Gastrointestinal: Positive for nausea (mild, intermittent, had chemo Monday). Negative for abdominal pain and  vomiting.  Genitourinary: Negative for dysuria and vaginal bleeding.  Musculoskeletal: Negative for back pain.  Psychiatric/Behavioral: Negative for behavioral problems and confusion.    Vital Signs: BP 123/65   Pulse 76   Temp (!) 97.5 F (36.4 C)   Resp 20   Ht 5\' 6"  (1.676 m)   Wt 185 lb (83.9 kg)   SpO2 99%   BMI 29.86 kg/m   Physical Exam  Constitutional: She is oriented to person, place, and time. She appears well-developed. No distress.  Neck: Normal range of motion. Neck supple.  Scar from recent [facility] placement/removal on right chest.   Cardiovascular: Normal rate, regular rhythm and normal heart sounds. Exam reveals no gallop and no friction rub.  No murmur heard. Pulmonary/Chest: Effort normal and breath sounds normal. No respiratory distress. She has no wheezes.  Abdominal: Soft. She exhibits no distension. There is no tenderness.  Neurological: She is alert and oriented to person, place, and time.  Skin: Skin is warm and dry. She is not diaphoretic.  Psychiatric: She has a normal mood and affect. Her behavior is normal. Judgment and thought content normal.  Nursing note and vitals reviewed.    MD Evaluation Airway: WNL Heart: WNL Abdomen: WNL Chest/ Lungs: WNL ASA  Classification: 3 Mallampati/Airway Score: One   Imaging: Ct Abdomen Pelvis Wo Contrast  Result Date: 06/11/2018 CLINICAL DATA:  Endometrial cancer EXAM: CT CHEST, ABDOMEN AND PELVIS WITHOUT CONTRAST TECHNIQUE: Multidetector CT imaging of the chest, abdomen and pelvis was performed following the standard protocol without IV contrast. COMPARISON:  03/11/2017, 02/25/2017, 07/29/2016 FINDINGS: CT CHEST FINDINGS Cardiovascular: No evidence of aortic aneurysm. Atherosclerotic calcifications of the thoracic aorta are noted. Left main coronary artery calcification. Mediastinum/Nodes: Abnormal mediastinal adenopathy. 1.6 cm right paratracheal node on image 14. 1.5 cm pretracheal node on image 21. 1.8 cm  precarinal node on image 28. No pericardial effusion. Small right thyroid hypodensity is stable. Moderate-sized hiatal hernia. Lungs/Pleura: No pneumothorax. Small bilateral pleural effusions. Bilateral lung masses and nodules. 2.1 cm lobulated right upper lobe nodule on image 46. Anterior ground-glass opacity in the right upper lobe on image 46. Subcentimeter lobulated right upper lobe nodule on image 72. 0.6  cm cavitary nodule in right lower lobe on image 76. Posterior pleural based nodule in the right lower lobe measures 0.8 cm on image 90. 7.3 cm right lower lobe posterior lung mass on image 111. Spiculated left upper lobe nodule measures 1 cm on image 38. 0.6 cm left upper lobe nodule on image 48. 1.5 cm cavitary left upper lobe nodule on image 63. Sub solid nodule in the superior segment of the left lower lobe on image 65. Spiculated left upper lobe pulmonary nodule measures 1 cm on image 71. Musculoskeletal: No vertebral compression deformity. No destructive bone lesion. Midthoracic vertebral hemangioma is noted. CT ABDOMEN PELVIS FINDINGS Hepatobiliary: 0.8 cm right lobe liver lesion on image 56. Anterior right lobe liver lesion on image 63 measures 2.5 cm. 1.2 cm inferior right lobe liver lesion on image 69 other smaller right lobe liver lesions are suspected. Postcholecystectomy. Pancreas: Unremarkable Spleen: Unremarkable Adrenals/Urinary Tract: Severe right hydronephrosis is associated with right renal cortical atrophy. Left kidney and adrenal glands are unremarkable. Right hydroureter these to a large right pelvic irregular soft tissue mass measuring 4.7 x 3.4 cm. It extends to the right pelvic sidewall and is inseparable from the right side of the bladder. There is associated wall thickening in the right side of the bladder. This is in the location of the patient's previous abscess. Stomach/Bowel: Prominent stool burden in the descending and sigmoid colon. There is wall thickening in the ascending  colon and transverse colon associated with an ill-defined mass within the mesentery measuring 2.8 x 11.5 cm. There is a lobulated 2.4 cm soft tissue mass along the distal sigmoid colon on image 99. There is no evidence of small-bowel obstruction. Vascular/Lymphatic: Atherosclerotic calcifications of the aorta and iliac arteries. Small retroperitoneal lymph nodes along the aorta. Reproductive: Uterus is absent. Other: Small amount of free fluid layers in the pelvis. Musculoskeletal: No destructive bone lesion. No vertebral compression deformity. IMPRESSION: There are several findings most likely related to metastatic malignancy described below: Multiple bilateral pulmonary nodules and masses, some of which are cavitary. Abnormal mediastinal adenopathy. Multiple liver lesions. Irregular soft tissue mass in the right side of the pelvis resulting in right ureteral obstruction. Central mesentery mass in the abdomen extending to the ascending and transverse colon causing wall thickening. 2.4 cm lobulated mass in the distal sigmoid colon. Electronically Signed   By: Marybelle Killings M.D.   On: 06/11/2018 18:23   Dg Chest 2 View  Result Date: 06/10/2018 CLINICAL DATA:  Fatigue for 6 months. History of endometrial carcinoma. EXAM: CHEST - 2 VIEW COMPARISON:  PA and lateral chest 02/17/2017. FINDINGS: The patient has a pleural based mass in the right lower lobe measuring 7.4 cm craniocaudal by 5.4 cm AP by 6.9 cm transverse. A new nodule in the right upper lobe measures 2.1 cm craniocaudal. There is also a new nodule in the left upper lobe measuring 1.3 cm transverse. No consolidative process, pneumothorax or effusion. Aortic atherosclerosis is noted. Heart size is normal. No acute or focal bony abnormality. IMPRESSION: New large with pleural based mass in the right lower lobe and nodules in both the right and left lung are worrisome for metastatic disease in this patient with a history of endometrial carcinoma. Recommend CT  chest, abdomen and pelvis with contrast for further evaluation. Electronically Signed   By: Inge Rise M.D.   On: 06/10/2018 12:09   Ct Chest Wo Contrast  Result Date: 06/11/2018 CLINICAL DATA:  Endometrial cancer EXAM: CT CHEST, ABDOMEN AND PELVIS  WITHOUT CONTRAST TECHNIQUE: Multidetector CT imaging of the chest, abdomen and pelvis was performed following the standard protocol without IV contrast. COMPARISON:  03/11/2017, 02/25/2017, 07/29/2016 FINDINGS: CT CHEST FINDINGS Cardiovascular: No evidence of aortic aneurysm. Atherosclerotic calcifications of the thoracic aorta are noted. Left main coronary artery calcification. Mediastinum/Nodes: Abnormal mediastinal adenopathy. 1.6 cm right paratracheal node on image 14. 1.5 cm pretracheal node on image 21. 1.8 cm precarinal node on image 28. No pericardial effusion. Small right thyroid hypodensity is stable. Moderate-sized hiatal hernia. Lungs/Pleura: No pneumothorax. Small bilateral pleural effusions. Bilateral lung masses and nodules. 2.1 cm lobulated right upper lobe nodule on image 46. Anterior ground-glass opacity in the right upper lobe on image 46. Subcentimeter lobulated right upper lobe nodule on image 72. 0.6 cm cavitary nodule in right lower lobe on image 76. Posterior pleural based nodule in the right lower lobe measures 0.8 cm on image 90. 7.3 cm right lower lobe posterior lung mass on image 111. Spiculated left upper lobe nodule measures 1 cm on image 38. 0.6 cm left upper lobe nodule on image 48. 1.5 cm cavitary left upper lobe nodule on image 63. Sub solid nodule in the superior segment of the left lower lobe on image 65. Spiculated left upper lobe pulmonary nodule measures 1 cm on image 71. Musculoskeletal: No vertebral compression deformity. No destructive bone lesion. Midthoracic vertebral hemangioma is noted. CT ABDOMEN PELVIS FINDINGS Hepatobiliary: 0.8 cm right lobe liver lesion on image 56. Anterior right lobe liver lesion on image 63  measures 2.5 cm. 1.2 cm inferior right lobe liver lesion on image 69 other smaller right lobe liver lesions are suspected. Postcholecystectomy. Pancreas: Unremarkable Spleen: Unremarkable Adrenals/Urinary Tract: Severe right hydronephrosis is associated with right renal cortical atrophy. Left kidney and adrenal glands are unremarkable. Right hydroureter these to a large right pelvic irregular soft tissue mass measuring 4.7 x 3.4 cm. It extends to the right pelvic sidewall and is inseparable from the right side of the bladder. There is associated wall thickening in the right side of the bladder. This is in the location of the patient's previous abscess. Stomach/Bowel: Prominent stool burden in the descending and sigmoid colon. There is wall thickening in the ascending colon and transverse colon associated with an ill-defined mass within the mesentery measuring 2.8 x 11.5 cm. There is a lobulated 2.4 cm soft tissue mass along the distal sigmoid colon on image 99. There is no evidence of small-bowel obstruction. Vascular/Lymphatic: Atherosclerotic calcifications of the aorta and iliac arteries. Small retroperitoneal lymph nodes along the aorta. Reproductive: Uterus is absent. Other: Small amount of free fluid layers in the pelvis. Musculoskeletal: No destructive bone lesion. No vertebral compression deformity. IMPRESSION: There are several findings most likely related to metastatic malignancy described below: Multiple bilateral pulmonary nodules and masses, some of which are cavitary. Abnormal mediastinal adenopathy. Multiple liver lesions. Irregular soft tissue mass in the right side of the pelvis resulting in right ureteral obstruction. Central mesentery mass in the abdomen extending to the ascending and transverse colon causing wall thickening. 2.4 cm lobulated mass in the distal sigmoid colon. Electronically Signed   By: Marybelle Killings M.D.   On: 06/11/2018 18:23    Labs:  CBC: Recent Labs    05/06/18 1215  06/10/18 1118 06/14/18 1323 06/25/18 0814  WBC 5.2 5.6 5.2 5.6  HGB 10.6* 11.0* 10.6* 10.1*  HCT 33.3* 35.0 33.4* 32.6*  PLT 460* 406 311 243    COAGS: Recent Labs    03/23/18 1150 04/05/18 1345  06/25/18 0814  INR 1.01 1.05 1.02  APTT  --  28  --     BMP: Recent Labs    04/14/18 0552 05/06/18 1215 06/10/18 1118 06/14/18 1323  NA 138 141 142 141  K 3.8 4.2 5.0 4.0  CL 104 104 102 104  CO2 27 27 22 26   GLUCOSE 98 90 92 97  BUN 22 17 20 15   CALCIUM 8.3* 8.7* 9.8 9.4  CREATININE 1.38* 1.35* 1.42* 1.34*  GFRNONAA 35* 36* 35* 36*  GFRAA 40* 41* 40* 42*    LIVER FUNCTION TESTS: Recent Labs    04/05/18 1345 05/06/18 1215 06/10/18 1118 06/14/18 1323  BILITOT 0.2* 0.3 <0.2 0.3  AST 18 16 18 16   ALT 9 <6 7 <6  ALKPHOS 61 99 75 73  PROT 7.0 6.9 6.7 7.2  ALBUMIN 3.8 3.3* 4.4 3.6    TUMOR MARKERS: No results for input(s): AFPTM, CEA, CA199, CHROMGRNA in the last 8760 hours.  Assessment and Plan: Patient with past medical history of endometrial cancer s/p Port-A-Cath placement in 2017 and removal in 02/2018 presents with complaint of disease recurrence.  IR consulted for Port-A-Cath replacement at the request of Dr. Alvy Bimler. Case reviewed by Dr. Barbie Banner who approves patient for procedure.  Patient presents today in their usual state of health with the exception of ongoing fatigue.  She has been NPO and is not currently on blood thinners.  Risks and benefits of image guided port-a-catheter placement was discussed with the patient including, but not limited to bleeding, infection, pneumothorax, or fibrin sheath development and need for additional procedures.  All of the patient's questions were answered, patient is agreeable to proceed. Consent signed and in chart.   Thank you for this interesting consult.  I greatly enjoyed meeting Kayla Guerrero and look forward to participating in their care.  A copy of this report was sent to the requesting provider on this  date.  Electronically Signed: Docia Barrier, PA 06/25/2018, 9:40 AM   I spent a total of    15 Minutes in face to face in clinical consultation, greater than 50% of which was counseling/coordinating care for endometrial cancer.

## 2018-06-25 NOTE — Discharge Instructions (Signed)
Implanted Port Insertion, Care After °This sheet gives you information about how to care for yourself after your procedure. Your health care provider may also give you more specific instructions. If you have problems or questions, contact your health care provider. °What can I expect after the procedure? °After your procedure, it is common to have: °· Discomfort at the port insertion site. °· Bruising on the skin over the port. This should improve over 3-4 days. ° °Follow these instructions at home: °Port care °· After your port is placed, you will get a manufacturer's information card. The card has information about your port. Keep this card with you at all times. °· Take care of the port as told by your health care provider. Ask your health care provider if you or a family member can get training for taking care of the port at home. A home health care nurse may also take care of the port. °· Make sure to remember what type of port you have. °Incision care °· Follow instructions from your health care provider about how to take care of your port insertion site. Make sure you: °? Wash your hands with soap and water before you change your bandage (dressing). If soap and water are not available, use hand sanitizer. °? Change your dressing as told by your health care provider. °? Leave stitches (sutures), skin glue, or adhesive strips in place. These skin closures may need to stay in place for 2 weeks or longer. If adhesive strip edges start to loosen and curl up, you may trim the loose edges. Do not remove adhesive strips completely unless your health care provider tells you to do that. °· Check your port insertion site every day for signs of infection. Check for: °? More redness, swelling, or pain. °? More fluid or blood. °? Warmth. °? Pus or a bad smell. °General instructions °· Do not take baths, swim, or use a hot tub until your health care provider approves. °· Do not lift anything that is heavier than 10 lb (4.5  kg) for a week, or as told by your health care provider. °· Ask your health care provider when it is okay to: °? Return to work or school. °? Resume usual physical activities or sports. °· Do not drive for 24 hours if you were given a medicine to help you relax (sedative). °· Take over-the-counter and prescription medicines only as told by your health care provider. °· Wear a medical alert bracelet in case of an emergency. This will tell any health care providers that you have a port. °· Keep all follow-up visits as told by your health care provider. This is important. °Contact a health care provider if: °· You cannot flush your port with saline as directed, or you cannot draw blood from the port. °· You have a fever or chills. °· You have more redness, swelling, or pain around your port insertion site. °· You have more fluid or blood coming from your port insertion site. °· Your port insertion site feels warm to the touch. °· You have pus or a bad smell coming from the port insertion site. °Get help right away if: °· You have chest pain or shortness of breath. °· You have bleeding from your port that you cannot control. °Summary °· Take care of the port as told by your health care provider. °· Change your dressing as told by your health care provider. °· Keep all follow-up visits as told by your health care provider. °  This information is not intended to replace advice given to you by your health care provider. Make sure you discuss any questions you have with your health care provider. °Document Released: 07/06/2013 Document Revised: 08/06/2016 Document Reviewed: 08/06/2016 °[employer] Interactive Patient Education © 2017 [employer] °Moderate Conscious Sedation, Adult, Care After °These instructions provide you with information about caring for yourself after your procedure. Your health care provider may also give you more specific instructions. Your treatment has been planned according to current medical  practices, but problems sometimes occur. Call your health care provider if you have any problems or questions after your procedure. °What can I expect after the procedure? °After your procedure, it is common: °· To feel sleepy for several hours. °· To feel clumsy and have poor balance for several hours. °· To have poor judgment for several hours. °· To vomit if you eat too soon. ° °Follow these instructions at home: °For at least 24 hours after the procedure: ° °· Do not: °? Participate in activities where you could fall or become injured. °? Drive. °? Use heavy machinery. °? Drink alcohol. °? Take sleeping pills or medicines that cause drowsiness. °? Make important decisions or sign legal documents. °? Take care of children on your own. °· Rest. °Eating and drinking °· Follow the diet recommended by your health care provider. °· If you vomit: °? Drink water, juice, or soup when you can drink without vomiting. °? Make sure you have little or no nausea before eating solid foods. °General instructions °· Have a responsible adult stay with you until you are awake and alert. °· Take over-the-counter and prescription medicines only as told by your health care provider. °· If you smoke, do not smoke without supervision. °· Keep all follow-up visits as told by your health care provider. This is important. °Contact a health care provider if: °· You keep feeling nauseous or you keep vomiting. °· You feel light-headed. °· You develop a rash. °· You have a fever. °Get help right away if: °· You have trouble breathing. °This information is not intended to replace advice given to you by your health care provider. Make sure you discuss any questions you have with your health care provider. °Document Released: 07/06/2013 Document Revised: 02/18/2016 Document Reviewed: 01/05/2016 °[employer] Interactive Patient Education © 2018 [employer] ° °

## 2018-06-25 NOTE — Procedures (Signed)
RIJV PAC SVC RA EBL 0 Comp 0 

## 2018-06-28 ENCOUNTER — Telehealth: Payer: Self-pay | Admitting: Family Medicine

## 2018-06-28 NOTE — Telephone Encounter (Signed)
Pt has been advised . KH 

## 2018-06-28 NOTE — Telephone Encounter (Signed)
Please advise pt she is fine to follow the same recommendations.  We have a lower threshold for antibiotics--if she gets any discolored mucus or fever, to let us know.

## 2018-06-28 NOTE — Telephone Encounter (Signed)
Per husband, Kayla Guerrero, pt started having same symptoms yesterday that he started having before coming to see Dr. Tomi Bamberger last week. Pt is having cough and dry throat. Is it OK for her to take Claritin and Mucinex DM as was recommended for husband? Or does pt need to be seen given her treatments that she is going through?

## 2018-07-01 ENCOUNTER — Telehealth: Payer: Self-pay | Admitting: Hematology and Oncology

## 2018-07-01 ENCOUNTER — Telehealth: Payer: Self-pay

## 2018-07-01 NOTE — Telephone Encounter (Signed)
Called and given below message. She verbalized understanding. She requested appt for tomorrow. Scheduling message sent. She has take Gabapentin in the past and did like it. Above message given to Dr. Alvy Bimler.

## 2018-07-01 NOTE — Telephone Encounter (Signed)
Scheduled apt per 10/3 sch message - pt is aware of appt date and time

## 2018-07-01 NOTE — Telephone Encounter (Signed)
One dose is not adequate to assess for benefit. Typically, we need minimum 3 doses before repeat imaging If neuropathy bothers her too much, with pain, we can try to start her on gabapentin 300 mg BID I advise antiemetics for nausea If she has many questions she want to address now, I can see her later today or tomorrow

## 2018-07-01 NOTE — Telephone Encounter (Signed)
She called and left a message to call her.   Called back and left a message to call the office. 

## 2018-07-01 NOTE — Telephone Encounter (Signed)
Called back. She has 3 questions for Dr. Alvy Bimler. 1. When will she have her next scan to check progress? 2. She feels like the neuropathy is worse since starting the carboplatin. She has neuropathy in her right hand and feet. 3. The Carboplatin has made her so weak and tired since her last treatment. She complains of nausea at times but she is able to eat and drink. Why does she have to get 2 more treatments of the carboplatin? Could she just go straight to the immunotherapy?

## 2018-07-02 ENCOUNTER — Inpatient Hospital Stay: Payer: Medicare Other | Attending: Hematology and Oncology | Admitting: Hematology and Oncology

## 2018-07-02 ENCOUNTER — Encounter: Payer: Self-pay | Admitting: Hematology and Oncology

## 2018-07-02 VITALS — BP 152/67 | HR 93 | Temp 98.6°F | Resp 18 | Ht 66.0 in | Wt 185.0 lb

## 2018-07-02 DIAGNOSIS — R590 Localized enlarged lymph nodes: Secondary | ICD-10-CM | POA: Diagnosis not present

## 2018-07-02 DIAGNOSIS — G893 Neoplasm related pain (acute) (chronic): Secondary | ICD-10-CM

## 2018-07-02 DIAGNOSIS — C541 Malignant neoplasm of endometrium: Secondary | ICD-10-CM | POA: Insufficient documentation

## 2018-07-02 DIAGNOSIS — Z79899 Other long term (current) drug therapy: Secondary | ICD-10-CM | POA: Insufficient documentation

## 2018-07-02 DIAGNOSIS — R0981 Nasal congestion: Secondary | ICD-10-CM | POA: Insufficient documentation

## 2018-07-02 DIAGNOSIS — J90 Pleural effusion, not elsewhere classified: Secondary | ICD-10-CM | POA: Insufficient documentation

## 2018-07-02 DIAGNOSIS — I700 Atherosclerosis of aorta: Secondary | ICD-10-CM | POA: Insufficient documentation

## 2018-07-02 DIAGNOSIS — T451X5A Adverse effect of antineoplastic and immunosuppressive drugs, initial encounter: Secondary | ICD-10-CM | POA: Insufficient documentation

## 2018-07-02 DIAGNOSIS — R05 Cough: Secondary | ICD-10-CM | POA: Insufficient documentation

## 2018-07-02 DIAGNOSIS — Z90710 Acquired absence of both cervix and uterus: Secondary | ICD-10-CM | POA: Diagnosis not present

## 2018-07-02 DIAGNOSIS — Z9049 Acquired absence of other specified parts of digestive tract: Secondary | ICD-10-CM | POA: Diagnosis not present

## 2018-07-02 DIAGNOSIS — K5900 Constipation, unspecified: Secondary | ICD-10-CM | POA: Insufficient documentation

## 2018-07-02 DIAGNOSIS — Z9221 Personal history of antineoplastic chemotherapy: Secondary | ICD-10-CM | POA: Insufficient documentation

## 2018-07-02 DIAGNOSIS — R112 Nausea with vomiting, unspecified: Secondary | ICD-10-CM

## 2018-07-02 DIAGNOSIS — C786 Secondary malignant neoplasm of retroperitoneum and peritoneum: Secondary | ICD-10-CM | POA: Insufficient documentation

## 2018-07-02 DIAGNOSIS — G629 Polyneuropathy, unspecified: Secondary | ICD-10-CM | POA: Diagnosis not present

## 2018-07-02 DIAGNOSIS — C787 Secondary malignant neoplasm of liver and intrahepatic bile duct: Secondary | ICD-10-CM

## 2018-07-02 DIAGNOSIS — E538 Deficiency of other specified B group vitamins: Secondary | ICD-10-CM | POA: Diagnosis not present

## 2018-07-02 DIAGNOSIS — R110 Nausea: Secondary | ICD-10-CM | POA: Insufficient documentation

## 2018-07-02 DIAGNOSIS — G620 Drug-induced polyneuropathy: Secondary | ICD-10-CM

## 2018-07-02 MED ORDER — TRIAMCINOLONE ACETONIDE 55 MCG/ACT NA AERO: 1.0000 | INHALATION_SPRAY | Freq: Two times a day (BID) | NASAL | 12 refills | Status: AC

## 2018-07-02 NOTE — Assessment & Plan Note (Signed)
She has mild worsening peripheral neuropathy Due to her constipation, I am reluctant to start her on gabapentin I will reassess next week If neuropathy is improved or worse and constipation resolved, I plan to start her on low-dose gabapentin

## 2018-07-02 NOTE — Assessment & Plan Note (Signed)
She is experiencing multiple side effects from treatment She is wondering whether she should proceed with repeat imaging study and switch her treatment I reassured the patient and her husband that the side effects she is experiencing and not unexpected We will continue aggressive supportive care I do not recommend repeat imaging study so soon after treatment and would not recommend switching treatment at this point in time I addressed all her questions.

## 2018-07-02 NOTE — Assessment & Plan Note (Signed)
She has some nasal congestion that is triggering some cough I recommend trial of Nasacort

## 2018-07-02 NOTE — Progress Notes (Signed)
[place] OFFICE PROGRESS NOTE  Patient Care Team: Rita Ohara, MD as PCP - General (Family Medicine) Susa Day, MD as Consulting Physician (Orthopedic Surgery)  ASSESSMENT & PLAN:  Malignant neoplasm of endometrium [facility]) She is experiencing multiple side effects from treatment She is wondering whether she should proceed with repeat imaging study and switch her treatment I reassured the patient and her husband that the side effects she is experiencing and not unexpected We will continue aggressive supportive care I do not recommend repeat imaging study so soon after treatment and would not recommend switching treatment at this point in time I addressed all her questions.  Cancer associated pain She has intermittent abdominal discomfort but not severe Continue aggressive supportive care and to take pain medicine if need  Chemotherapy-induced neuropathy ([place]) She has mild worsening peripheral neuropathy Due to her constipation, I am reluctant to start her on gabapentin I will reassess next week If neuropathy is improved or worse and constipation resolved, I plan to start her on low-dose gabapentin  Peritoneal metastases ([place]) Her examination is benign There is no signs of bowel obstruction or recurrent ascites She has mild constipation but is responding well to laxatives I recommend aggressive laxative therapy to avoid constipation that might aggravate her sensation no nausea  Nausea without vomiting She has profound nausea without vomiting She will continue low-dose dexamethasone daily along with antiemetics as needed I felt that constipation might have aggravated her nausea and recommend aggressive laxative therapy and hydration as tolerated  Congestion of nasal sinus She has some nasal congestion that is triggering some cough I recommend trial of Nasacort   No orders of the defined types were placed in this encounter.   INTERVAL HISTORY: Please see  below for problem oriented charting. She is seen urgently for symptom management Since chemotherapy, she has been complaining of profound nausea but no vomiting She had some constipation intermittently but is taking laxatives She complained of some nasal congestion with some mild cough but no fever or chills She has some shortness of breath on minimal exertion and has profound fatigue She is still taking dexamethasone but sometimes she takes it late at night She denies sleep issues She complained of some worsening neuropathy affecting her hands causing burning sensation and worsening numbness or tingling affecting both feet She has poor appetite She is wondering about switching treatment to ordering imaging study  SUMMARY OF ONCOLOGIC HISTORY: Oncology History   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 Microscopic Comment The overall appearance favors serous carcinoma.     04/23/2016 Initial Diagnosis    Patient presented to PCP with new vaginal discharge and some vaginal spotting, referred to Dr Dory Horn, whom she had known previously. D&C on 04-09-16 had high grade carcinoma favoring serous histology [phone]). She was referred to gyn oncology, saw Dr Denman George on 04-23-16.     04/30/2016 Imaging    Markedly thickened/widened endometrium consistent with known endometrial cancer. No evidence of serosal or extra uterine extension. 2. No findings to suggest metastatic disease involving the chest, abdomen or pelvis. 3. Indeterminant 12.5 mm right renal lesion, small enhancing mass versus hemorrhagic cyst. Attention on future scans is suggested. 4. Atherosclerotic calcifications involving the thoracic and abdominal aorta and branch vessels but no focal aneurysm. 5. Moderate stool throughout the  colon and down into the  rectum may suggest constipation.     05/06/2016 Surgery    Robotic hysterectomy and staging. IB USC, 0/11 nodes. Dispositioned to chemotherapy with paclitaxel and carboplatin x 6 with vaginal brachytherapy.    05/06/2016 Pathology Results    1. Lymph node, biopsy, right peri-aortic - ONE OF ONE LYMPH NODES NEGATIVE FOR MALIGNANCY (0/1). 2. Lymph node, biopsy, left peri-aortic - ONE OF ONE LYMPH NODES NEGATIVE FOR MALIGNANCY (0/1). 3. Lymph node, biopsy, right pelvic - FOUR OF FOUR LYMPH NODES NEGATIVE FOR MALIGNANCY (0/4). 4. Lymph node, biopsy, left pelvic - FIVE OF FIVE LYMPH NODES NEGATIVE FOR MALIGNANCY (0/5). 5. Uterus +/- tubes/ovaries, neoplastic - UTERUS: -ENDO/MYOMETRIUM: INVASIVE MIXED ENDOMETRIOID AND SEROUS CARCINOMA, SPANNING 4 CM. TUMOR INVADES OUTER HALF OF MYOMETRIUM. LYMPHOVASCULAR INVASION PRESENT. SEE ONCOLOGY TABLE. LEIOMYOMA. -SEROSA: UNINVOLVED. NO MALIGNANCY. - CERVIX: BENIGN SQUAMOUS AND ENDOCERVICAL MUCOSA. NO DYSPLASIA OR MALIGNANCY. - BILATERAL OVARIES: INCLUSION CYSTS. NO MALIGNANCY. - BILATERAL FALLOPIAN TUBES: UNREMARKABLE. NO MALIGNANCY.  Specimen: Uterus, cervix, bilateral ovaries and fallopian tubes, bilateral pelvic and para-aortic lymph nodes. Procedure: Hysterectomy with bilateral salpingo-oophorectomy. Lymph node sampling performed: Bilateral pelvic and para-aortic lymph node biopsies Specimen integrity: Intact. 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    05/16/2016 Imaging    Ct abdomen: Extensive subcutaneous emphysema about the abdomen and pelvis, likely postoperative. Probable abdominal pelvic wall small volume hematomas, as above. 2. Right pelvic sidewall fluid collection is likely a seroma or lymphangioma. No explanation for left lower extremity pain. 3. Minimal ill-defined fluid in the presacral space and left adnexa. 4. Small hiatal hernia. 5. An incidental finding of potential clinical  significance has been found. Indeterminate right renal lesion is similar to on the recent exam. Consider further evaluation with dedicated outpatient pre and post contrast abdominal MRI    06/11/2016 - 08/22/2016 Chemotherapy    The patient completed only three cycles due to toxicity from paclitaxel and carboplatin.     07/29/2016 Imaging    CT angiogram chest: No demonstrable pulmonary embolus. Multiple foci of atherosclerotic calcification in the aorta as well as foci of coronary artery calcification.  No edema or consolidation. No lung mass or nodule lesion. No adenopathy. Gallbladder absent.  Stable mild biliary duct prominence. Stable nodular opacity right lobe of thyroid which does not meet consensus guidelines criteria for further assessment    08/06/2016 - 09/03/2016 Radiation Therapy    HDR vaginal cuff brachytherapy x 5 fractions.    08/11/2016 Procedure    Ultrasound and fluoroscopically guided right internal jugular single lumen power port catheter insertion. Tip in the SVC/RA junction. Catheter ready for use.    09/23/2016 Imaging    Ct abdomen: No findings suspicious for metastatic disease in the abdomen or pelvis. 2. Thin-walled lobulated 6.9 x 5.8 x 5.8 cm right pelvic sidewall fluid collection, increased in size since 05/16/2016 CT study, favor a postoperative lymphocele, which demonstrates extrinsic mass-effect on the right bladder wall. 3. Indeterminate 1.2 cm posterior interpolar right renal cortical lesion, for which 4 month stability has been demonstrated, renal neoplasm not excluded. Recommend either dedicated renal protocol MRI or CT abdomen without and with IV contrast or continued attention on follow-up surveillance CT studies, as clinically warranted. 4. Additional findings include aortic atherosclerosis, moderate hiatal hernia, moderate sigmoid diverticulosis, tiny fat containing umbilical hernia, and degenerative disc disease, facet arthropathy and spondylolisthesis in  the lower lumbar spine    09/30/2016 Procedure    She  had successful CT-guided aspiration of right pelvic fluid collection. Approximately 90 mL yellow serous fluid was aspirated. Samples were sent for culture and cytology.    11/19/2016 Imaging    CT abdomen: No CT findings to suggest recurrent tumor, lymphadenopathy or metastatic disease. 2. Stable right-sided pelvic sidewall cyst with mass effect on the bladder. 3. Stable advanced atherosclerotic calcifications involving the aorta and iliac arteries. 4. Moderate size hiatal hernia. 5. Status post cholecystectomy with stable intra and extrahepatic biliary dilatation    12/29/2016 Pathology Results    SOFT TISSUE, FINE NEEDLE ASPIRATION, RIGHT ADNEXAL CYST (SPECIMEN 1 OF 1 COLLECTED 09-30-2016) NO MALIGNANT CELLS IDENTIFIED.    02/17/2017 Imaging    CT abdomen 1. The previously noted thin walled cystic lesion in the right pelvis has undergone change in appearance ; it is now thick-walled and rim enhancing. There is associated inflammation and edema surrounding the pelvic sidewall lesion which has also increased in size. This suggests interim inflammation/infection of the cystic collection. 2. Urinary bladder displaced to the left by the thick-walled right pelvic sidewall cystic lesion. Wall thickening of the bladder is likely reactive but could also be due to a cystitis. Suggest correlation with urinalysis. 3. Stable intra and extrahepatic biliary dilatation post cholecystectomy 4. Stable 13 mm intermediate density right renal lesion 5. Diffuse diverticular disease of the colon without acute inflammation    02/17/2017 - 02/20/2017 Hospital Admission    She presented to the emergency department due to lower abdominal pain, diarrhea for 4 days associated with weakness, hypersomnolence following an episode of a gastroenteritis about a week ago after returning from the beach. She was told to decrease her Cymbalta to decrease somnolence. CT Abd/Pelvis was  done which revealed that the previously seen thinwall cystic lesions in the right pelvis is now take wall and ring-enhancing with associated inflammation and edema surrounding the pelvic sidewall lesions which also has increased in size. This suggests inter-inflammation/infection of the cystic collection. The urinary bladder is displaced to the left by these lesions. General Surgery, IR, and GYN were consulted.She underwent IR Drain Right Pelvic Drain placement on 5/23 and is improving. Drain Fluid Cx grew out some Streptococcus Viridans and Cytology is negative. Her symptoms resolved with antibiotics     02/18/2017 Procedure    Successful CT guided placement of a 10 French all purpose drain catheter into the residual/recurrent right pelvic sidewall fluid collection with aspiration of 50 mL of purulent fluid. Samples were sent to the laboratory for both culture and cytologic analysis.    02/25/2017 Imaging    Significant improvement in the bilobed right pelvic sidewall abscess following percutaneous drain. Stable drain catheter position. Small residual abscess is noted, measurements as above. No new abscess. Otherwise stable CT of the abdomen pelvis with contrast.    03/11/2017 Imaging    Ct abdomen: Resolution of right pelvic infected fluid collection after percutaneous drainage. No further fluid is seen around the percutaneous drain and acute inflammatory changes also have nearly resolved    12/03/2017 Imaging    No abnormality identified within the pelvis.    03/23/2018 Procedure    Technically successful tunneled Port catheter removal.    06/11/2018 Imaging    There are several findings most likely related to metastatic malignancy described below:  Multiple bilateral pulmonary nodules and masses, some of which are cavitary.  Abnormal mediastinal adenopathy.  Multiple liver lesions.  Irregular soft tissue mass in the right side of the pelvis resulting in right ureteral  obstruction.  Central mesentery mass in the abdomen extending to the ascending and transverse colon causing wall thickening.  2.4 cm lobulated mass in the distal sigmoid colon    06/14/2018 Tumor Marker    Patient's tumor was tested for the following markers: CA-125 Results of the tumor marker test revealed 96.6     Genetic Testing    Patient has genetic testing done for MSI on Surgical Patholgy from 05/06/2016. Results revealed patient has the following: MSI: stable.    06/21/2018 -  Chemotherapy    The patient had carboplatin     REVIEW OF SYSTEMS:   Constitutional: Denies fevers, chills or abnormal weight loss Eyes: Denies blurriness of vision Ears, nose, mouth, throat, and face: Denies mucositis or sore throat Cardiovascular: Denies palpitation, chest discomfort or lower extremity swelling Skin: Denies abnormal skin rashes Lymphatics: Denies new lymphadenopathy or easy bruising Behavioral/Psych: Mood is stable, no new changes  All other systems were reviewed with the patient and are negative.  I have reviewed the past medical history, past surgical history, social history and family history with the patient and they are unchanged from previous note.  ALLERGIES:  is allergic to levofloxacin; promethazine hcl; sulfa antibiotics; sulfa drugs cross reactors; and sulfur.  MEDICATIONS:  Current Outpatient Medications  Medication Sig Dispense Refill  . cholecalciferol (VITAMIN D) 1000 units tablet Take 1,000 Units by mouth daily.     [name] dexamethasone (DECADRON) 4 MG tablet Take 1 tablet (4 mg total) by mouth daily. 30 tablet 0  . HYDROmorphone (DILAUDID) 2 MG tablet Take 1 tablet (2 mg total) by mouth every 4 (four) hours as needed for severe pain. 30 tablet 0  . levothyroxine (SYNTHROID, LEVOTHROID) 88 MCG tablet TAKE ONE TABLET BY MOUTH ONCE DAILY BEFORE  BREAKFAST. (Patient taking differently: Take 88 mcg by mouth daily before breakfast. ) 90 tablet 3  . lidocaine-prilocaine  (EMLA) cream Apply 1 application topically as needed. 30 g 6  . Melatonin 5 MG CAPS Take 5 mg by mouth at bedtime.     . metoCLOPramide (REGLAN) 10 MG tablet Take 1 tablet (10 mg total) by mouth every 8 (eight) hours as needed for nausea. (Patient taking differently: Take 10 mg by mouth 3 (three) times daily before meals. ) 30 tablet 1  . Multiple Vitamins-Minerals (MULTIVITAMIN WITH MINERALS) tablet Take 1 tablet by mouth daily.    . ondansetron (ZOFRAN) 8 MG tablet Take 1 tablet (8 mg total) by mouth every 8 (eight) hours as needed for nausea. 30 tablet 3  . Polyethyl Glycol-Propyl Glycol (SYSTANE ULTRA) 0.4-0.3 % SOLN Place 1 drop into both eyes daily as needed (for dry eyes).    . polyethylene glycol (MIRALAX / GLYCOLAX) packet Take 17 g by mouth daily.     [name] triamcinolone (NASACORT) 55 MCG/ACT AERO nasal inhaler Place 1 spray into the nose 2 (two) times daily. 1 Inhaler 12   No current facility-administered medications for this visit.    Facility-Administered Medications Ordered in Other Visits  Medication Dose Route Frequency Provider Last Rate Last Dose  . influenza  inactive virus vaccine (FLUZONE/FLUARIX) injection 0.5 mL  0.5 mL Intramuscular Once Rita Ohara, MD        PHYSICAL EXAMINATION: ECOG PERFORMANCE STATUS: 2 - Symptomatic, <50% confined to bed  Vitals:   07/02/18 1508  BP: (!) 152/67  Pulse: 93  Resp: 18  Temp: 98.6 F (37 C)  SpO2: 94%   Filed Weights   07/02/18 1508  Weight: 185 lb (83.9 kg)  GENERAL:alert, no distress and comfortable SKIN: Noted some skin bruising EYES: normal, Conjunctiva are pink and non-injected, sclera clear OROPHARYNX:no exudate, no erythema and lips, buccal mucosa, and tongue normal  NECK: supple, thyroid normal size, non-tender, without nodularity LYMPH:  no palpable lymphadenopathy in the cervical, axillary or inguinal LUNGS: clear to auscultation and percussion with normal breathing effort HEART: regular rate & rhythm and no  murmurs and no lower extremity edema ABDOMEN:abdomen soft, non-tender and normal bowel sounds Musculoskeletal:no cyanosis of digits and no clubbing  NEURO: alert & oriented x 3 with fluent speech, no focal motor/sensory deficits  LABORATORY DATA:  I have reviewed the data as listed    Component Value Date/Time   NA 138 06/25/2018 0814   NA 142 06/10/2018 1118   NA 142 03/16/2017 0852   K 3.6 06/25/2018 0814   K 4.2 03/16/2017 0852   CL 103 06/25/2018 0814   CO2 24 06/25/2018 0814   CO2 28 03/16/2017 0852   GLUCOSE 92 06/25/2018 0814   GLUCOSE 93 03/16/2017 0852   BUN 24 (H) 06/25/2018 0814   BUN 20 06/10/2018 1118   BUN 9.9 03/16/2017 0852   CREATININE 1.37 (H) 06/25/2018 0814   CREATININE 1.35 (H) 05/06/2018 1215   CREATININE 0.86 06/25/2017 1119   CREATININE 0.8 03/16/2017 0852   CALCIUM 8.3 (L) 06/25/2018 0814   CALCIUM 9.2 03/16/2017 0852   PROT 7.2 06/14/2018 1323   PROT 6.7 06/10/2018 1118   PROT 7.1 03/16/2017 0852   ALBUMIN 3.6 06/14/2018 1323   ALBUMIN 4.4 06/10/2018 1118   ALBUMIN 3.1 (L) 03/16/2017 0852   AST 16 06/14/2018 1323   AST 16 05/06/2018 1215   AST 19 03/16/2017 0852   ALT <6 06/14/2018 1323   ALT <6 05/06/2018 1215   ALT 9 03/16/2017 0852   ALKPHOS 73 06/14/2018 1323   ALKPHOS 64 03/16/2017 0852   BILITOT 0.3 06/14/2018 1323   BILITOT <0.2 06/10/2018 1118   BILITOT 0.3 05/06/2018 1215   BILITOT 0.27 03/16/2017 0852   GFRNONAA 35 (L) 06/25/2018 0814   GFRNONAA 36 (L) 05/06/2018 1215   GFRAA 41 (L) 06/25/2018 0814   GFRAA 41 (L) 05/06/2018 1215    No results found for: SPEP, UPEP  Lab Results  Component Value Date   WBC 5.6 06/25/2018   NEUTROABS 3.1 06/14/2018   HGB 10.1 (L) 06/25/2018   HCT 32.6 (L) 06/25/2018   MCV 87.6 06/25/2018   PLT 243 06/25/2018      Chemistry      Component Value Date/Time   NA 138 06/25/2018 0814   NA 142 06/10/2018 1118   NA 142 03/16/2017 0852   K 3.6 06/25/2018 0814   K 4.2 03/16/2017 0852   CL  103 06/25/2018 0814   CO2 24 06/25/2018 0814   CO2 28 03/16/2017 0852   BUN 24 (H) 06/25/2018 0814   BUN 20 06/10/2018 1118   BUN 9.9 03/16/2017 0852   CREATININE 1.37 (H) 06/25/2018 0814   CREATININE 1.35 (H) 05/06/2018 1215   CREATININE 0.86 06/25/2017 1119   CREATININE 0.8 03/16/2017 0852      Component Value Date/Time   CALCIUM 8.3 (L) 06/25/2018 0814   CALCIUM 9.2 03/16/2017 0852   ALKPHOS 73 06/14/2018 1323   ALKPHOS 64 03/16/2017 0852   AST 16 06/14/2018 1323   AST 16 05/06/2018 1215   AST 19 03/16/2017 0852   ALT <6 06/14/2018 1323   ALT <6 05/06/2018 1215   ALT 9 03/16/2017 8850  BILITOT 0.3 06/14/2018 1323   BILITOT <0.2 06/10/2018 1118   BILITOT 0.3 05/06/2018 1215   BILITOT 0.27 03/16/2017 0852       RADIOGRAPHIC STUDIES: I have personally reviewed the radiological images as listed and agreed with the findings in the report. Ct Abdomen Pelvis Wo Contrast  Result Date: 06/11/2018 CLINICAL DATA:  Endometrial cancer EXAM: CT CHEST, ABDOMEN AND PELVIS WITHOUT CONTRAST TECHNIQUE: Multidetector CT imaging of the chest, abdomen and pelvis was performed following the standard protocol without IV contrast. COMPARISON:  03/11/2017, 02/25/2017, 07/29/2016 FINDINGS: CT CHEST FINDINGS Cardiovascular: No evidence of aortic aneurysm. Atherosclerotic calcifications of the thoracic aorta are noted. Left main coronary artery calcification. Mediastinum/Nodes: Abnormal mediastinal adenopathy. 1.6 cm right paratracheal node on image 14. 1.5 cm pretracheal node on image 21. 1.8 cm precarinal node on image 28. No pericardial effusion. Small right thyroid hypodensity is stable. Moderate-sized hiatal hernia. Lungs/Pleura: No pneumothorax. Small bilateral pleural effusions. Bilateral lung masses and nodules. 2.1 cm lobulated right upper lobe nodule on image 46. Anterior ground-glass opacity in the right upper lobe on image 46. Subcentimeter lobulated right upper lobe nodule on image 72. 0.6 cm  cavitary nodule in right lower lobe on image 76. Posterior pleural based nodule in the right lower lobe measures 0.8 cm on image 90. 7.3 cm right lower lobe posterior lung mass on image 111. Spiculated left upper lobe nodule measures 1 cm on image 38. 0.6 cm left upper lobe nodule on image 48. 1.5 cm cavitary left upper lobe nodule on image 63. Sub solid nodule in the superior segment of the left lower lobe on image 65. Spiculated left upper lobe pulmonary nodule measures 1 cm on image 71. Musculoskeletal: No vertebral compression deformity. No destructive bone lesion. Midthoracic vertebral hemangioma is noted. CT ABDOMEN PELVIS FINDINGS Hepatobiliary: 0.8 cm right lobe liver lesion on image 56. Anterior right lobe liver lesion on image 63 measures 2.5 cm. 1.2 cm inferior right lobe liver lesion on image 69 other smaller right lobe liver lesions are suspected. Postcholecystectomy. Pancreas: Unremarkable Spleen: Unremarkable Adrenals/Urinary Tract: Severe right hydronephrosis is associated with right renal cortical atrophy. Left kidney and adrenal glands are unremarkable. Right hydroureter these to a large right pelvic irregular soft tissue mass measuring 4.7 x 3.4 cm. It extends to the right pelvic sidewall and is inseparable from the right side of the bladder. There is associated wall thickening in the right side of the bladder. This is in the location of the patient's previous abscess. Stomach/Bowel: Prominent stool burden in the descending and sigmoid colon. There is wall thickening in the ascending colon and transverse colon associated with an ill-defined mass within the mesentery measuring 2.8 x 11.5 cm. There is a lobulated 2.4 cm soft tissue mass along the distal sigmoid colon on image 99. There is no evidence of small-bowel obstruction. Vascular/Lymphatic: Atherosclerotic calcifications of the aorta and iliac arteries. Small retroperitoneal lymph nodes along the aorta. Reproductive: Uterus is absent. Other:  Small amount of free fluid layers in the pelvis. Musculoskeletal: No destructive bone lesion. No vertebral compression deformity. IMPRESSION: There are several findings most likely related to metastatic malignancy described below: Multiple bilateral pulmonary nodules and masses, some of which are cavitary. Abnormal mediastinal adenopathy. Multiple liver lesions. Irregular soft tissue mass in the right side of the pelvis resulting in right ureteral obstruction. Central mesentery mass in the abdomen extending to the ascending and transverse colon causing wall thickening. 2.4 cm lobulated mass in the distal sigmoid colon. Electronically Signed  By: Marybelle Killings M.D.   On: 06/11/2018 18:23   Dg Chest 2 View  Result Date: 06/10/2018 CLINICAL DATA:  Fatigue for 6 months. History of endometrial carcinoma. EXAM: CHEST - 2 VIEW COMPARISON:  PA and lateral chest 02/17/2017. FINDINGS: The patient has a pleural based mass in the right lower lobe measuring 7.4 cm craniocaudal by 5.4 cm AP by 6.9 cm transverse. A new nodule in the right upper lobe measures 2.1 cm craniocaudal. There is also a new nodule in the left upper lobe measuring 1.3 cm transverse. No consolidative process, pneumothorax or effusion. Aortic atherosclerosis is noted. Heart size is normal. No acute or focal bony abnormality. IMPRESSION: New large with pleural based mass in the right lower lobe and nodules in both the right and left lung are worrisome for metastatic disease in this patient with a history of endometrial carcinoma. Recommend CT chest, abdomen and pelvis with contrast for further evaluation. Electronically Signed   By: Inge Rise M.D.   On: 06/10/2018 12:09   Ct Chest Wo Contrast  Result Date: 06/11/2018 CLINICAL DATA:  Endometrial cancer EXAM: CT CHEST, ABDOMEN AND PELVIS WITHOUT CONTRAST TECHNIQUE: Multidetector CT imaging of the chest, abdomen and pelvis was performed following the standard protocol without IV contrast.  COMPARISON:  03/11/2017, 02/25/2017, 07/29/2016 FINDINGS: CT CHEST FINDINGS Cardiovascular: No evidence of aortic aneurysm. Atherosclerotic calcifications of the thoracic aorta are noted. Left main coronary artery calcification. Mediastinum/Nodes: Abnormal mediastinal adenopathy. 1.6 cm right paratracheal node on image 14. 1.5 cm pretracheal node on image 21. 1.8 cm precarinal node on image 28. No pericardial effusion. Small right thyroid hypodensity is stable. Moderate-sized hiatal hernia. Lungs/Pleura: No pneumothorax. Small bilateral pleural effusions. Bilateral lung masses and nodules. 2.1 cm lobulated right upper lobe nodule on image 46. Anterior ground-glass opacity in the right upper lobe on image 46. Subcentimeter lobulated right upper lobe nodule on image 72. 0.6 cm cavitary nodule in right lower lobe on image 76. Posterior pleural based nodule in the right lower lobe measures 0.8 cm on image 90. 7.3 cm right lower lobe posterior lung mass on image 111. Spiculated left upper lobe nodule measures 1 cm on image 38. 0.6 cm left upper lobe nodule on image 48. 1.5 cm cavitary left upper lobe nodule on image 63. Sub solid nodule in the superior segment of the left lower lobe on image 65. Spiculated left upper lobe pulmonary nodule measures 1 cm on image 71. Musculoskeletal: No vertebral compression deformity. No destructive bone lesion. Midthoracic vertebral hemangioma is noted. CT ABDOMEN PELVIS FINDINGS Hepatobiliary: 0.8 cm right lobe liver lesion on image 56. Anterior right lobe liver lesion on image 63 measures 2.5 cm. 1.2 cm inferior right lobe liver lesion on image 69 other smaller right lobe liver lesions are suspected. Postcholecystectomy. Pancreas: Unremarkable Spleen: Unremarkable Adrenals/Urinary Tract: Severe right hydronephrosis is associated with right renal cortical atrophy. Left kidney and adrenal glands are unremarkable. Right hydroureter these to a large right pelvic irregular soft tissue mass  measuring 4.7 x 3.4 cm. It extends to the right pelvic sidewall and is inseparable from the right side of the bladder. There is associated wall thickening in the right side of the bladder. This is in the location of the patient's previous abscess. Stomach/Bowel: Prominent stool burden in the descending and sigmoid colon. There is wall thickening in the ascending colon and transverse colon associated with an ill-defined mass within the mesentery measuring 2.8 x 11.5 cm. There is a lobulated 2.4 cm soft tissue  mass along the distal sigmoid colon on image 99. There is no evidence of small-bowel obstruction. Vascular/Lymphatic: Atherosclerotic calcifications of the aorta and iliac arteries. Small retroperitoneal lymph nodes along the aorta. Reproductive: Uterus is absent. Other: Small amount of free fluid layers in the pelvis. Musculoskeletal: No destructive bone lesion. No vertebral compression deformity. IMPRESSION: There are several findings most likely related to metastatic malignancy described below: Multiple bilateral pulmonary nodules and masses, some of which are cavitary. Abnormal mediastinal adenopathy. Multiple liver lesions. Irregular soft tissue mass in the right side of the pelvis resulting in right ureteral obstruction. Central mesentery mass in the abdomen extending to the ascending and transverse colon causing wall thickening. 2.4 cm lobulated mass in the distal sigmoid colon. Electronically Signed   By: Marybelle Killings M.D.   On: 06/11/2018 18:23   [facility] Guided Port Insertion  Result Date: 06/25/2018 CLINICAL DATA:  Endometrial carcinoma EXAM: TUNNEL POWER PORT PLACEMENT WITH SUBCUTANEOUS POCKET UTILIZING ULTRASOUND & FLOUROSCOPY FLUOROSCOPY TIME:  18 seconds.  One mGy. MEDICATIONS AND MEDICAL HISTORY: Versed 1.5 mg, Fentanyl 75 mcg. Additional Medications: 2 g. Antibiotics were given within 2 hours of the procedure. ANESTHESIA/SEDATION: Moderate sedation time: 24 minutes. Nursing monitored the  the patient during the procedure. PROCEDURE: After written informed consent was obtained, patient was placed in the supine position on angiographic table. The right neck and chest was prepped and draped in a sterile fashion. Lidocaine was utilized for local anesthesia. The right jugular vein was noted to be patent initially with ultrasound. Under sonographic guidance, a micropuncture needle was inserted into the right IJ vein (Ultrasound and fluoroscopic image documentation was performed). The needle was removed over an 018 wire which was exchanged for a Amplatz. This was advanced into the IVC. An 8-French dilator was advanced over the Amplatz. A small incision was made in the right upper chest over the anterior right second rib. Utilizing blunt dissection, a subcutaneous pocket was created in the caudal direction. The pocket was irrigated with a copious amount of sterile normal saline. The port catheter was tunneled from the chest incision, and out the neck incision. The reservoir was inserted into the subcutaneous pocket and secured with two 3-0 Ethilon stitches. A peel-away sheath was advanced over the Amplatz wire. The port catheter was cut to measure length and inserted through the peel-away sheath. The peel-away sheath was removed. The chest incision was closed with 3-0 Vicryl interrupted stitches for the subcutaneous tissue and a running of 4-0 Vicryl subcuticular stitch for the skin. The neck incision was closed with a 4-0 Vicryl subcuticular stitch. Derma-bond was applied to both surgical incisions. The port reservoir was flushed and instilled with heparinized saline. No complications. FINDINGS: A right IJ vein Port-A-Cath is in place with its tip at the cavoatrial junction. COMPLICATIONS: None IMPRESSION: Successful 8 French right internal jugular vein power port placement with its tip at the SVC/RA junction. Electronically Signed   By: Marybelle Killings M.D.   On: 06/25/2018 11:35    All questions were  answered. The patient knows to call the clinic with any problems, questions or concerns. No barriers to learning was detected.  I spent 25 minutes counseling the patient face to face. The total time spent in the appointment was 40 minutes and more than 50% was on counseling and review of test results  [name], MD 07/02/2018 3:49 PM

## 2018-07-02 NOTE — Assessment & Plan Note (Signed)
She has intermittent abdominal discomfort but not severe Continue aggressive supportive care and to take pain medicine if need

## 2018-07-02 NOTE — Assessment & Plan Note (Signed)
Her examination is benign There is no signs of bowel obstruction or recurrent ascites She has mild constipation but is responding well to laxatives I recommend aggressive laxative therapy to avoid constipation that might aggravate her sensation no nausea

## 2018-07-02 NOTE — Assessment & Plan Note (Signed)
She has profound nausea without vomiting She will continue low-dose dexamethasone daily along with antiemetics as needed I felt that constipation might have aggravated her nausea and recommend aggressive laxative therapy and hydration as tolerated

## 2018-07-12 ENCOUNTER — Inpatient Hospital Stay (HOSPITAL_BASED_OUTPATIENT_CLINIC_OR_DEPARTMENT_OTHER): Payer: Medicare Other | Admitting: Hematology and Oncology

## 2018-07-12 ENCOUNTER — Inpatient Hospital Stay: Payer: Medicare Other

## 2018-07-12 ENCOUNTER — Telehealth: Payer: Self-pay | Admitting: Hematology and Oncology

## 2018-07-12 ENCOUNTER — Other Ambulatory Visit: Payer: Self-pay

## 2018-07-12 ENCOUNTER — Telehealth: Payer: Self-pay

## 2018-07-12 ENCOUNTER — Encounter: Payer: Self-pay | Admitting: Hematology and Oncology

## 2018-07-12 DIAGNOSIS — R55 Syncope and collapse: Secondary | ICD-10-CM | POA: Diagnosis not present

## 2018-07-12 DIAGNOSIS — Z95828 Presence of other vascular implants and grafts: Secondary | ICD-10-CM

## 2018-07-12 DIAGNOSIS — R110 Nausea: Secondary | ICD-10-CM

## 2018-07-12 DIAGNOSIS — E538 Deficiency of other specified B group vitamins: Secondary | ICD-10-CM | POA: Diagnosis not present

## 2018-07-12 DIAGNOSIS — G620 Drug-induced polyneuropathy: Secondary | ICD-10-CM

## 2018-07-12 DIAGNOSIS — G893 Neoplasm related pain (acute) (chronic): Secondary | ICD-10-CM

## 2018-07-12 DIAGNOSIS — C541 Malignant neoplasm of endometrium: Secondary | ICD-10-CM

## 2018-07-12 DIAGNOSIS — E039 Hypothyroidism, unspecified: Secondary | ICD-10-CM

## 2018-07-12 DIAGNOSIS — Z9221 Personal history of antineoplastic chemotherapy: Secondary | ICD-10-CM

## 2018-07-12 DIAGNOSIS — N1330 Unspecified hydronephrosis: Secondary | ICD-10-CM

## 2018-07-12 DIAGNOSIS — Z79899 Other long term (current) drug therapy: Secondary | ICD-10-CM

## 2018-07-12 DIAGNOSIS — Z923 Personal history of irradiation: Secondary | ICD-10-CM

## 2018-07-12 DIAGNOSIS — T451X5A Adverse effect of antineoplastic and immunosuppressive drugs, initial encounter: Secondary | ICD-10-CM

## 2018-07-12 DIAGNOSIS — I2609 Other pulmonary embolism with acute cor pulmonale: Secondary | ICD-10-CM | POA: Diagnosis not present

## 2018-07-12 LAB — COMPREHENSIVE METABOLIC PANEL
ALT: 8 U/L (ref 0–44)
ANION GAP: 10 (ref 5–15)
AST: 20 U/L (ref 15–41)
Albumin: 3.6 g/dL (ref 3.5–5.0)
Alkaline Phosphatase: 58 U/L (ref 38–126)
BUN: 23 mg/dL (ref 8–23)
CALCIUM: 9.1 mg/dL (ref 8.9–10.3)
CO2: 25 mmol/L (ref 22–32)
Chloride: 106 mmol/L (ref 98–111)
Creatinine, Ser: 1.19 mg/dL — ABNORMAL HIGH (ref 0.44–1.00)
GFR calc non Af Amer: 42 mL/min — ABNORMAL LOW (ref >60)
GFR, EST AFRICAN AMERICAN: 48 mL/min — AB (ref >60)
Glucose, Bld: 97 mg/dL (ref 70–99)
Potassium: 3.7 mmol/L (ref 3.5–5.1)
SODIUM: 141 mmol/L (ref 135–145)
Total Bilirubin: 0.3 mg/dL (ref 0.3–1.2)
Total Protein: 6.7 g/dL (ref 6.5–8.1)

## 2018-07-12 LAB — CBC WITH DIFFERENTIAL/PLATELET
Abs Immature Granulocytes: 0.02 10*3/uL (ref 0.00–0.07)
BASOS ABS: 0 10*3/uL (ref 0.0–0.1)
Basophils Relative: 0 %
EOS ABS: 0 10*3/uL (ref 0.0–0.5)
Eosinophils Relative: 0 %
HEMATOCRIT: 32.3 % — AB (ref 36.0–46.0)
Hemoglobin: 10.3 g/dL — ABNORMAL LOW (ref 12.0–15.0)
IMMATURE GRANULOCYTES: 1 %
LYMPHS ABS: 0.6 10*3/uL — AB (ref 0.7–4.0)
Lymphocytes Relative: 15 %
MCH: 26.7 pg (ref 26.0–34.0)
MCHC: 31.9 g/dL (ref 30.0–36.0)
MCV: 83.7 fL (ref 80.0–100.0)
Monocytes Absolute: 0.3 10*3/uL (ref 0.1–1.0)
Monocytes Relative: 7 %
NEUTROS PCT: 77 %
NRBC: 0 % (ref 0.0–0.2)
Neutro Abs: 3.1 10*3/uL (ref 1.7–7.7)
PLATELETS: 252 10*3/uL (ref 150–400)
RBC: 3.86 MIL/uL — ABNORMAL LOW (ref 3.87–5.11)
RDW: 16.5 % — AB (ref 11.5–15.5)
WBC: 4.1 10*3/uL (ref 4.0–10.5)

## 2018-07-12 LAB — VITAMIN B12: Vitamin B-12: 253 pg/mL (ref 180–914)

## 2018-07-12 MED ORDER — SODIUM CHLORIDE 0.9 % IV SOLN
Freq: Once | INTRAVENOUS | Status: AC
  Administered 2018-07-12: 12:00:00 via INTRAVENOUS
  Filled 2018-07-12: qty 250

## 2018-07-12 MED ORDER — HEPARIN SOD (PORK) LOCK FLUSH 100 UNIT/ML IV SOLN
500.0000 [IU] | Freq: Once | INTRAVENOUS | Status: AC | PRN
  Administered 2018-07-12: 500 [IU]
  Filled 2018-07-12: qty 5

## 2018-07-12 MED ORDER — METOCLOPRAMIDE HCL 10 MG PO TABS: 10.0000 mg | ORAL_TABLET | Freq: Three times a day (TID) | ORAL | 11 refills | Status: AC

## 2018-07-12 MED ORDER — SODIUM CHLORIDE 0.9 % IV SOLN
298.0000 mg | Freq: Once | INTRAVENOUS | Status: AC
  Administered 2018-07-12: 300 mg via INTRAVENOUS
  Filled 2018-07-12: qty 30

## 2018-07-12 MED ORDER — LEVOTHYROXINE SODIUM 88 MCG PO TABS: ORAL_TABLET | ORAL | 3 refills | Status: AC

## 2018-07-12 MED ORDER — PALONOSETRON HCL INJECTION 0.25 MG/5ML
INTRAVENOUS | Status: AC
  Filled 2018-07-12: qty 5

## 2018-07-12 MED ORDER — PALONOSETRON HCL INJECTION 0.25 MG/5ML
0.2500 mg | Freq: Once | INTRAVENOUS | Status: AC
  Administered 2018-07-12: 0.25 mg via INTRAVENOUS

## 2018-07-12 MED ORDER — HEPARIN SOD (PORK) LOCK FLUSH 100 UNIT/ML IV SOLN
500.0000 [IU] | Freq: Once | INTRAVENOUS | Status: DC
  Filled 2018-07-12: qty 5

## 2018-07-12 MED ORDER — SODIUM CHLORIDE 0.9% FLUSH
10.0000 mL | INTRAVENOUS | Status: DC | PRN
  Administered 2018-07-12: 10 mL
  Filled 2018-07-12: qty 10

## 2018-07-12 MED ORDER — SODIUM CHLORIDE 0.9 % IV SOLN
Freq: Once | INTRAVENOUS | Status: AC
  Administered 2018-07-12: 12:00:00 via INTRAVENOUS
  Filled 2018-07-12: qty 5

## 2018-07-12 MED ORDER — DRONABINOL 2.5 MG PO CAPS: 2.5000 mg | ORAL_CAPSULE | Freq: Two times a day (BID) | ORAL | 0 refills | Status: AC

## 2018-07-12 MED ORDER — DEXAMETHASONE 4 MG PO TABS: 4.0000 mg | ORAL_TABLET | Freq: Every day | ORAL | 1 refills | Status: DC

## 2018-07-12 MED ORDER — SODIUM CHLORIDE 0.9% FLUSH
10.0000 mL | Freq: Once | INTRAVENOUS | Status: AC
  Administered 2018-07-12: 10 mL
  Filled 2018-07-12: qty 10

## 2018-07-12 NOTE — Assessment & Plan Note (Signed)
She has persistent abdominal pain secondary to her abdominal disease She will continue prescription pain medicine as needed

## 2018-07-12 NOTE — Assessment & Plan Note (Signed)
She felt worsening neuropathy Plan to reduce a dose of treatment and recommend resumption of Cymbalta

## 2018-07-12 NOTE — Telephone Encounter (Signed)
[place] had TSH checked 04/2018

## 2018-07-12 NOTE — Telephone Encounter (Signed)
-----   Message from [name], MD sent at 07/12/2018  1:27 PM EDT ----- Regarding: B12 Tell her the number is borderline low I recommend OTC 1000 mcg daily vitamin B12 ----- Message ----- From: Interface, [facility] [place] Sent: 07/12/2018   9:39 AM EDT To: [name], MD

## 2018-07-12 NOTE — Telephone Encounter (Signed)
Called and given below message. She verbalized understanding. 

## 2018-07-12 NOTE — Assessment & Plan Note (Signed)
She has borderline vitamin B12 deficiency I recommend oral vitamin B12 supplement Hopefully, with aggressive replacement therapy, it would also improve neuropathy

## 2018-07-12 NOTE — Patient Instructions (Signed)
[facility] Discharge Instructions for Patients Receiving Chemotherapy  Today you received the following chemotherapy agents Carboplatin  To help prevent nausea and vomiting after your treatment, we encourage you to take your nausea medication as directed   If you develop nausea and vomiting that is not controlled by your nausea medication, call the clinic.   BELOW ARE SYMPTOMS THAT SHOULD BE REPORTED IMMEDIATELY:  *FEVER GREATER THAN 100.5 F  *CHILLS WITH OR WITHOUT FEVER  NAUSEA AND VOMITING THAT IS NOT CONTROLLED WITH YOUR NAUSEA MEDICATION  *UNUSUAL SHORTNESS OF BREATH  *UNUSUAL BRUISING OR BLEEDING  TENDERNESS IN MOUTH AND THROAT WITH OR WITHOUT PRESENCE OF ULCERS  *URINARY PROBLEMS  *BOWEL PROBLEMS  UNUSUAL RASH Items with * indicate a potential emergency and should be followed up as soon as possible.  Feel free to call the clinic should you have any questions or concerns. The clinic phone number is [phone].  Please show the CHEMO ALERT CARD at check-in to the Emergency Department and triage nurse.   

## 2018-07-12 NOTE — Assessment & Plan Note (Signed)
She has profound nausea without vomiting She will continue low-dose dexamethasone daily along with antiemetics as needed I felt that constipation might have aggravated her nausea and recommend aggressive laxative therapy and hydration as tolerated I also recommend a trial of Marinol to see if this would help with her nausea and she is willing to try.

## 2018-07-12 NOTE — Assessment & Plan Note (Signed)
Renal function is stable/improved I recommend aggressive hydration therapy

## 2018-07-12 NOTE — Assessment & Plan Note (Signed)
She continues to experience a lot of abdominal discomfort, consistent with peritoneal disease We will proceed with cycle 2 of treatment with dose adjustment due to poor tolerance to therapy I recommend minimum 3 cycles of treatment before repeat imaging study In the meantime, I plan to see her back next week for further supportive care

## 2018-07-12 NOTE — Progress Notes (Signed)
[place] OFFICE PROGRESS NOTE  Patient Care Team: Rita Ohara, MD as PCP - General (Family Medicine) Susa Day, MD as Consulting Physician (Orthopedic Surgery)  ASSESSMENT & PLAN:  Malignant neoplasm of endometrium [facility]) She continues to experience a lot of abdominal discomfort, consistent with peritoneal disease We will proceed with cycle 2 of treatment with dose adjustment due to poor tolerance to therapy I recommend minimum 3 cycles of treatment before repeat imaging study In the meantime, I plan to see her back next week for further supportive care  Cancer associated pain She has persistent abdominal pain secondary to her abdominal disease She will continue prescription pain medicine as needed  Chemotherapy-induced neuropathy ([place]) She felt worsening neuropathy Plan to reduce a dose of treatment and recommend resumption of Cymbalta  Hydronephrosis of right kidney Renal function is stable/improved I recommend aggressive hydration therapy  B12 deficiency She has borderline vitamin B12 deficiency I recommend oral vitamin B12 supplement Hopefully, with aggressive replacement therapy, it would also improve neuropathy  Nausea without vomiting She has profound nausea without vomiting She will continue low-dose dexamethasone daily along with antiemetics as needed I felt that constipation might have aggravated her nausea and recommend aggressive laxative therapy and hydration as tolerated I also recommend a trial of Marinol to see if this would help with her nausea and she is willing to try.   Orders Placed This Encounter  Procedures  . Vitamin B12    Standing Status:   Future    Number of Occurrences:   1    Standing Expiration Date:   08/16/2019    INTERVAL HISTORY: Please see below for problem oriented charting. 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 Microscopic Comment The overall appearance favors serous carcinoma.     04/23/2016 Initial Diagnosis    Patient presented to PCP with new vaginal discharge and some vaginal spotting, referred to Dr Dory Horn, whom she had known previously. D&C on 04-09-16 had high grade carcinoma favoring serous histology [phone]). She was referred to gyn oncology, saw Dr Denman George on 04-23-16.     04/30/2016 Imaging    Markedly thickened/widened endometrium consistent with known endometrial cancer. No evidence of serosal or extra uterine extension. 2. No findings to suggest metastatic disease involving the chest, abdomen or pelvis. 3. Indeterminant 12.5 mm right renal lesion, small enhancing mass versus hemorrhagic cyst. Attention on future scans is suggested. 4. Atherosclerotic calcifications involving the thoracic and abdominal aorta and branch vessels but no focal aneurysm. 5. Moderate stool throughout the colon and down into the rectum may suggest constipation.     05/06/2016 Surgery    Robotic hysterectomy and staging. IB USC, 0/11 nodes. Dispositioned to chemotherapy with paclitaxel and carboplatin x 6 with vaginal brachytherapy.    05/06/2016 Pathology Results    1. Lymph node,  biopsy, right peri-aortic - ONE OF ONE LYMPH NODES NEGATIVE FOR MALIGNANCY (0/1). 2. Lymph node, biopsy, left peri-aortic - ONE OF  ONE LYMPH NODES NEGATIVE FOR MALIGNANCY (0/1). 3. Lymph node, biopsy, right pelvic - FOUR OF FOUR LYMPH NODES NEGATIVE FOR MALIGNANCY (0/4). 4. Lymph node, biopsy, left pelvic - FIVE OF FIVE LYMPH NODES NEGATIVE FOR MALIGNANCY (0/5). 5. Uterus +/- tubes/ovaries, neoplastic - UTERUS: -ENDO/MYOMETRIUM: INVASIVE MIXED ENDOMETRIOID AND SEROUS CARCINOMA, SPANNING 4 CM. TUMOR INVADES OUTER HALF OF MYOMETRIUM. LYMPHOVASCULAR INVASION PRESENT. SEE ONCOLOGY TABLE. LEIOMYOMA. -SEROSA: UNINVOLVED. NO MALIGNANCY. - CERVIX: BENIGN SQUAMOUS AND ENDOCERVICAL MUCOSA. NO DYSPLASIA OR MALIGNANCY. - BILATERAL OVARIES: INCLUSION CYSTS. NO MALIGNANCY. - BILATERAL FALLOPIAN TUBES: UNREMARKABLE. NO MALIGNANCY.  Specimen: Uterus, cervix, bilateral ovaries and fallopian tubes, bilateral pelvic and para-aortic lymph nodes. Procedure: Hysterectomy with bilateral salpingo-oophorectomy. Lymph node sampling performed: Bilateral pelvic and para-aortic lymph node biopsies Specimen integrity: Intact. 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    05/16/2016 Imaging    Ct abdomen: Extensive subcutaneous emphysema about the abdomen and pelvis, likely postoperative. Probable abdominal pelvic wall small volume hematomas, as above. 2. Right pelvic sidewall fluid collection is likely a seroma or lymphangioma. No explanation for left lower extremity pain. 3. Minimal ill-defined fluid in the presacral space and left adnexa. 4. Small hiatal hernia. 5. An incidental finding of potential clinical significance has been found. Indeterminate right renal lesion is similar to on the recent exam. Consider further evaluation with dedicated outpatient pre and post contrast abdominal MRI    06/11/2016 - 08/22/2016 Chemotherapy    The patient completed only three cycles due to toxicity from paclitaxel and carboplatin.     07/29/2016 Imaging    CT angiogram chest: No demonstrable pulmonary  embolus. Multiple foci of atherosclerotic calcification in the aorta as well as foci of coronary artery calcification.  No edema or consolidation. No lung mass or nodule lesion. No adenopathy. Gallbladder absent.  Stable mild biliary duct prominence. Stable nodular opacity right lobe of thyroid which does not meet consensus guidelines criteria for further assessment    08/06/2016 - 09/03/2016 Radiation Therapy    HDR vaginal cuff brachytherapy x 5 fractions.    08/11/2016 Procedure    Ultrasound and fluoroscopically guided right internal jugular single lumen power port catheter insertion. Tip in the SVC/RA junction. Catheter ready for use.    09/23/2016 Imaging    Ct abdomen: No findings suspicious for metastatic disease in the abdomen or pelvis. 2. Thin-walled lobulated 6.9 x 5.8 x 5.8 cm right pelvic sidewall fluid collection, increased in size since 05/16/2016 CT study, favor a postoperative lymphocele, which demonstrates extrinsic mass-effect on the right bladder wall. 3. Indeterminate 1.2 cm posterior interpolar right renal cortical lesion, for which 4 month stability has been demonstrated, renal neoplasm not excluded. Recommend either dedicated renal protocol MRI or CT abdomen without and with IV contrast or continued attention on follow-up surveillance CT studies, as clinically warranted. 4. Additional findings include aortic atherosclerosis, moderate hiatal hernia, moderate sigmoid diverticulosis, tiny fat containing umbilical hernia, and degenerative disc disease, facet arthropathy and spondylolisthesis in the lower lumbar spine    09/30/2016 Procedure    She had successful CT-guided aspiration of right pelvic fluid collection. Approximately 90 mL yellow serous fluid was aspirated. Samples were sent for culture and cytology.    11/19/2016 Imaging    CT abdomen: No CT findings to suggest recurrent tumor, lymphadenopathy or metastatic disease. 2. Stable right-sided pelvic sidewall cyst  with mass  effect on the bladder. 3. Stable advanced atherosclerotic calcifications involving the aorta and iliac arteries. 4. Moderate size hiatal hernia. 5. Status post cholecystectomy with stable intra and extrahepatic biliary dilatation    12/29/2016 Pathology Results    SOFT TISSUE, FINE NEEDLE ASPIRATION, RIGHT ADNEXAL CYST (SPECIMEN 1 OF 1 COLLECTED 09-30-2016) NO MALIGNANT CELLS IDENTIFIED.    02/17/2017 Imaging    CT abdomen 1. The previously noted thin walled cystic lesion in the right pelvis has undergone change in appearance ; it is now thick-walled and rim enhancing. There is associated inflammation and edema surrounding the pelvic sidewall lesion which has also increased in size. This suggests interim inflammation/infection of the cystic collection. 2. Urinary bladder displaced to the left by the thick-walled right pelvic sidewall cystic lesion. Wall thickening of the bladder is likely reactive but could also be due to a cystitis. Suggest correlation with urinalysis. 3. Stable intra and extrahepatic biliary dilatation post cholecystectomy 4. Stable 13 mm intermediate density right renal lesion 5. Diffuse diverticular disease of the colon without acute inflammation    02/17/2017 - 02/20/2017 Hospital Admission    She presented to the emergency department due to lower abdominal pain, diarrhea for 4 days associated with weakness, hypersomnolence following an episode of a gastroenteritis about a week ago after returning from the beach. She was told to decrease her Cymbalta to decrease somnolence. CT Abd/Pelvis was done which revealed that the previously seen thinwall cystic lesions in the right pelvis is now take wall and ring-enhancing with associated inflammation and edema surrounding the pelvic sidewall lesions which also has increased in size. This suggests inter-inflammation/infection of the cystic collection. The urinary bladder is displaced to the left by these lesions. General Surgery, IR, and GYN  were consulted.She underwent IR Drain Right Pelvic Drain placement on 5/23 and is improving. Drain Fluid Cx grew out some Streptococcus Viridans and Cytology is negative. Her symptoms resolved with antibiotics     02/18/2017 Procedure    Successful CT guided placement of a 10 French all purpose drain catheter into the residual/recurrent right pelvic sidewall fluid collection with aspiration of 50 mL of purulent fluid. Samples were sent to the laboratory for both culture and cytologic analysis.    02/25/2017 Imaging    Significant improvement in the bilobed right pelvic sidewall abscess following percutaneous drain. Stable drain catheter position. Small residual abscess is noted, measurements as above. No new abscess. Otherwise stable CT of the abdomen pelvis with contrast.    03/11/2017 Imaging    Ct abdomen: Resolution of right pelvic infected fluid collection after percutaneous drainage. No further fluid is seen around the percutaneous drain and acute inflammatory changes also have nearly resolved    12/03/2017 Imaging    No abnormality identified within the pelvis.    03/23/2018 Procedure    Technically successful tunneled Port catheter removal.    06/11/2018 Imaging    There are several findings most likely related to metastatic malignancy described below:  Multiple bilateral pulmonary nodules and masses, some of which are cavitary.  Abnormal mediastinal adenopathy.  Multiple liver lesions.  Irregular soft tissue mass in the right side of the pelvis resulting in right ureteral obstruction.  Central mesentery mass in the abdomen extending to the ascending and transverse colon causing wall thickening.  2.4 cm lobulated mass in the distal sigmoid colon    06/14/2018 Tumor Marker    Patient's tumor was tested for the following markers: CA-125 Results of the tumor marker test revealed  96.6     Genetic Testing    Patient has genetic testing done for MSI on Surgical Patholgy from  05/06/2016. Results revealed patient has the following: MSI: stable.    06/21/2018 -  Chemotherapy    The patient had carboplatin     REVIEW OF SYSTEMS:   Constitutional: Denies fevers, chills or abnormal weight loss Eyes: Denies blurriness of vision Ears, nose, mouth, throat, and face: Denies mucositis or sore throat Cardiovascular: Denies palpitation, chest discomfort or lower extremity swelling Skin: Denies abnormal skin rashes Lymphatics: Denies new lymphadenopathy or easy bruising Behavioral/Psych: Mood is stable, no new changes  All other systems were reviewed with the patient and are negative.  I have reviewed the past medical history, past surgical history, social history and family history with the patient and they are unchanged from previous note.  ALLERGIES:  is allergic to levofloxacin; promethazine hcl; sulfa antibiotics; sulfa drugs cross reactors; and sulfur.  MEDICATIONS:  Current Outpatient Medications  Medication Sig Dispense Refill  . cholecalciferol (VITAMIN D) 1000 units tablet Take 1,000 Units by mouth daily.     [name] dexamethasone (DECADRON) 4 MG tablet Take 1 tablet (4 mg total) by mouth daily. 90 tablet 1  . dronabinol (MARINOL) 2.5 MG capsule Take 1 capsule (2.5 mg total) by mouth 2 (two) times daily before lunch and supper. 60 capsule 0  . HYDROmorphone (DILAUDID) 2 MG tablet Take 1 tablet (2 mg total) by mouth every 4 (four) hours as needed for severe pain. 30 tablet 0  . levothyroxine (SYNTHROID, LEVOTHROID) 88 MCG tablet TAKE ONE TABLET BY MOUTH ONCE DAILY BEFORE  BREAKFAST. (Patient taking differently: Take 88 mcg by mouth daily before breakfast. ) 90 tablet 3  . lidocaine-prilocaine (EMLA) cream Apply 1 application topically as needed. 30 g 6  . Melatonin 5 MG CAPS Take 5 mg by mouth at bedtime.     . metoCLOPramide (REGLAN) 10 MG tablet Take 1 tablet (10 mg total) by mouth 3 (three) times daily before meals. 90 tablet 11  . Multiple Vitamins-Minerals  (MULTIVITAMIN WITH MINERALS) tablet Take 1 tablet by mouth daily.    . ondansetron (ZOFRAN) 8 MG tablet Take 1 tablet (8 mg total) by mouth every 8 (eight) hours as needed for nausea. 30 tablet 3  . Polyethyl Glycol-Propyl Glycol (SYSTANE ULTRA) 0.4-0.3 % SOLN Place 1 drop into both eyes daily as needed (for dry eyes).    . polyethylene glycol (MIRALAX / GLYCOLAX) packet Take 17 g by mouth daily.     [name] triamcinolone (NASACORT) 55 MCG/ACT AERO nasal inhaler Place 1 spray into the nose 2 (two) times daily. 1 Inhaler 12   No current facility-administered medications for this visit.    Facility-Administered Medications Ordered in Other Visits  Medication Dose Route Frequency Provider Last Rate Last Dose  . influenza  inactive virus vaccine (FLUZONE/FLUARIX) injection 0.5 mL  0.5 mL Intramuscular Once Rita Ohara, MD      . sodium chloride flush (NS) 0.9 % injection 10 mL  10 mL Intracatheter PRN Alvy Bimler, Penny Frisbie, MD   10 mL at 07/12/18 1422    PHYSICAL EXAMINATION: ECOG PERFORMANCE STATUS: 2 - Symptomatic, <50% confined to bed  Vitals:   07/12/18 1030  BP: (!) 149/71  Pulse: 86  Resp: 18  Temp: 97.7 F (36.5 C)  SpO2: 91%   Filed Weights   07/12/18 1030  Weight: 184 lb (83.5 kg)    GENERAL:alert, no distress and comfortable SKIN: skin color, texture, turgor are normal,  no rashes or significant lesions EYES: normal, Conjunctiva are pink and non-injected, sclera clear OROPHARYNX:no exudate, no erythema and lips, buccal mucosa, and tongue normal  NECK: supple, thyroid normal size, non-tender, without nodularity LYMPH:  no palpable lymphadenopathy in the cervical, axillary or inguinal LUNGS: clear to auscultation and percussion with normal breathing effort HEART: regular rate & rhythm and no murmurs and no lower extremity edema ABDOMEN:abdomen soft, non-tender and normal bowel sounds Musculoskeletal:no cyanosis of digits and no clubbing  NEURO: alert & oriented x 3 with fluent speech, no  focal motor/sensory deficits  LABORATORY DATA:  I have reviewed the data as listed    Component Value Date/Time   NA 141 07/12/2018 0927   NA 142 06/10/2018 1118   NA 142 03/16/2017 0852   K 3.7 07/12/2018 0927   K 4.2 03/16/2017 0852   CL 106 07/12/2018 0927   CO2 25 07/12/2018 0927   CO2 28 03/16/2017 0852   GLUCOSE 97 07/12/2018 0927   GLUCOSE 93 03/16/2017 0852   BUN 23 07/12/2018 0927   BUN 20 06/10/2018 1118   BUN 9.9 03/16/2017 0852   CREATININE 1.19 (H) 07/12/2018 0927   CREATININE 1.35 (H) 05/06/2018 1215   CREATININE 0.86 06/25/2017 1119   CREATININE 0.8 03/16/2017 0852   CALCIUM 9.1 07/12/2018 0927   CALCIUM 9.2 03/16/2017 0852   PROT 6.7 07/12/2018 0927   PROT 6.7 06/10/2018 1118   PROT 7.1 03/16/2017 0852   ALBUMIN 3.6 07/12/2018 0927   ALBUMIN 4.4 06/10/2018 1118   ALBUMIN 3.1 (L) 03/16/2017 0852   AST 20 07/12/2018 0927   AST 16 05/06/2018 1215   AST 19 03/16/2017 0852   ALT 8 07/12/2018 0927   ALT <6 05/06/2018 1215   ALT 9 03/16/2017 0852   ALKPHOS 58 07/12/2018 0927   ALKPHOS 64 03/16/2017 0852   BILITOT 0.3 07/12/2018 0927   BILITOT <0.2 06/10/2018 1118   BILITOT 0.3 05/06/2018 1215   BILITOT 0.27 03/16/2017 0852   GFRNONAA 42 (L) 07/12/2018 0927   GFRNONAA 36 (L) 05/06/2018 1215   GFRAA 48 (L) 07/12/2018 0927   GFRAA 41 (L) 05/06/2018 1215    No results found for: SPEP, UPEP  Lab Results  Component Value Date   WBC 4.1 07/12/2018   NEUTROABS 3.1 07/12/2018   HGB 10.3 (L) 07/12/2018   HCT 32.3 (L) 07/12/2018   MCV 83.7 07/12/2018   PLT 252 07/12/2018      Chemistry      Component Value Date/Time   NA 141 07/12/2018 0927   NA 142 06/10/2018 1118   NA 142 03/16/2017 0852   K 3.7 07/12/2018 0927   K 4.2 03/16/2017 0852   CL 106 07/12/2018 0927   CO2 25 07/12/2018 0927   CO2 28 03/16/2017 0852   BUN 23 07/12/2018 0927   BUN 20 06/10/2018 1118   BUN 9.9 03/16/2017 0852   CREATININE 1.19 (H) 07/12/2018 0927   CREATININE 1.35 (H)  05/06/2018 1215   CREATININE 0.86 06/25/2017 1119   CREATININE 0.8 03/16/2017 0852      Component Value Date/Time   CALCIUM 9.1 07/12/2018 0927   CALCIUM 9.2 03/16/2017 0852   ALKPHOS 58 07/12/2018 0927   ALKPHOS 64 03/16/2017 0852   AST 20 07/12/2018 0927   AST 16 05/06/2018 1215   AST 19 03/16/2017 0852   ALT 8 07/12/2018 0927   ALT <6 05/06/2018 1215   ALT 9 03/16/2017 0852   BILITOT 0.3 07/12/2018 0927   BILITOT <0.2  06/10/2018 1118   BILITOT 0.3 05/06/2018 1215   BILITOT 0.27 03/16/2017 0852       RADIOGRAPHIC STUDIES: I have personally reviewed the radiological images as listed and agreed with the findings in the report. Ir Imaging Guided Port Insertion  Result Date: 06/25/2018 CLINICAL DATA:  Endometrial carcinoma EXAM: TUNNEL POWER PORT PLACEMENT WITH SUBCUTANEOUS POCKET UTILIZING ULTRASOUND & FLOUROSCOPY FLUOROSCOPY TIME:  18 seconds.  One mGy. MEDICATIONS AND MEDICAL HISTORY: Versed 1.5 mg, Fentanyl 75 mcg. Additional Medications: 2 g. Antibiotics were given within 2 hours of the procedure. ANESTHESIA/SEDATION: Moderate sedation time: 24 minutes. Nursing monitored the the patient during the procedure. PROCEDURE: After written informed consent was obtained, patient was placed in the supine position on angiographic table. The right neck and chest was prepped and draped in a sterile fashion. Lidocaine was utilized for local anesthesia. The right jugular vein was noted to be patent initially with ultrasound. Under sonographic guidance, a micropuncture needle was inserted into the right IJ vein (Ultrasound and fluoroscopic image documentation was performed). The needle was removed over an 018 wire which was exchanged for a Amplatz. This was advanced into the IVC. An 8-French dilator was advanced over the Amplatz. A small incision was made in the right upper chest over the anterior right second rib. Utilizing blunt dissection, a subcutaneous pocket was created in the caudal direction.  The pocket was irrigated with a copious amount of sterile normal saline. The port catheter was tunneled from the chest incision, and out the neck incision. The reservoir was inserted into the subcutaneous pocket and secured with two 3-0 Ethilon stitches. A peel-away sheath was advanced over the Amplatz wire. The port catheter was cut to measure length and inserted through the peel-away sheath. The peel-away sheath was removed. The chest incision was closed with 3-0 Vicryl interrupted stitches for the subcutaneous tissue and a running of 4-0 Vicryl subcuticular stitch for the skin. The neck incision was closed with a 4-0 Vicryl subcuticular stitch. Derma-bond was applied to both surgical incisions. The port reservoir was flushed and instilled with heparinized saline. No complications. FINDINGS: A right IJ vein Port-A-Cath is in place with its tip at the cavoatrial junction. COMPLICATIONS: None IMPRESSION: Successful 8 French right internal jugular vein power port placement with its tip at the SVC/RA junction. Electronically Signed   By: Marybelle Killings M.D.   On: 06/25/2018 11:35    All questions were answered. The patient knows to call the clinic with any problems, questions or concerns. No barriers to learning was detected.  I spent 30 minutes counseling the patient face to face. The total time spent in the appointment was 40 minutes and more than 50% was on counseling and review of test results  [name], MD 07/12/2018 3:08 PM

## 2018-07-12 NOTE — Telephone Encounter (Signed)
Gave avs and calendar ° °

## 2018-07-13 LAB — CA 125: Cancer Antigen (CA) 125: 95.2 U/mL — ABNORMAL HIGH (ref 0.0–38.1)

## 2018-07-14 ENCOUNTER — Emergency Department (HOSPITAL_COMMUNITY): Payer: Medicare Other

## 2018-07-14 ENCOUNTER — Encounter (HOSPITAL_COMMUNITY): Payer: Self-pay

## 2018-07-14 ENCOUNTER — Telehealth: Payer: Self-pay | Admitting: *Deleted

## 2018-07-14 ENCOUNTER — Other Ambulatory Visit: Payer: Self-pay

## 2018-07-14 ENCOUNTER — Inpatient Hospital Stay (HOSPITAL_COMMUNITY)
Admission: EM | Admit: 2018-07-14 | Discharge: 2018-07-17 | DRG: 175 | Disposition: A | Discharge status: Home / Self Care | Payer: Medicare Other | Attending: Internal Medicine | Admitting: Internal Medicine

## 2018-07-14 DIAGNOSIS — I82441 Acute embolism and thrombosis of right tibial vein: Secondary | ICD-10-CM | POA: Diagnosis present

## 2018-07-14 DIAGNOSIS — D630 Anemia in neoplastic disease: Secondary | ICD-10-CM | POA: Diagnosis not present

## 2018-07-14 DIAGNOSIS — C787 Secondary malignant neoplasm of liver and intrahepatic bile duct: Secondary | ICD-10-CM | POA: Diagnosis present

## 2018-07-14 DIAGNOSIS — I2609 Other pulmonary embolism with acute cor pulmonale: Principal | ICD-10-CM | POA: Diagnosis present

## 2018-07-14 DIAGNOSIS — Z8542 Personal history of malignant neoplasm of other parts of uterus: Secondary | ICD-10-CM

## 2018-07-14 DIAGNOSIS — I82431 Acute embolism and thrombosis of right popliteal vein: Secondary | ICD-10-CM | POA: Diagnosis present

## 2018-07-14 DIAGNOSIS — D649 Anemia, unspecified: Secondary | ICD-10-CM | POA: Diagnosis present

## 2018-07-14 DIAGNOSIS — D509 Iron deficiency anemia, unspecified: Secondary | ICD-10-CM | POA: Diagnosis not present

## 2018-07-14 DIAGNOSIS — K219 Gastro-esophageal reflux disease without esophagitis: Secondary | ICD-10-CM | POA: Diagnosis present

## 2018-07-14 DIAGNOSIS — Z7951 Long term (current) use of inhaled steroids: Secondary | ICD-10-CM

## 2018-07-14 DIAGNOSIS — I82412 Acute embolism and thrombosis of left femoral vein: Secondary | ICD-10-CM | POA: Diagnosis present

## 2018-07-14 DIAGNOSIS — I82403 Acute embolism and thrombosis of unspecified deep veins of lower extremity, bilateral: Secondary | ICD-10-CM | POA: Diagnosis present

## 2018-07-14 DIAGNOSIS — Z96652 Presence of left artificial knee joint: Secondary | ICD-10-CM | POA: Diagnosis present

## 2018-07-14 DIAGNOSIS — C786 Secondary malignant neoplasm of retroperitoneum and peritoneum: Secondary | ICD-10-CM | POA: Diagnosis not present

## 2018-07-14 DIAGNOSIS — I5030 Unspecified diastolic (congestive) heart failure: Secondary | ICD-10-CM | POA: Diagnosis not present

## 2018-07-14 DIAGNOSIS — Z79890 Hormone replacement therapy (postmenopausal): Secondary | ICD-10-CM | POA: Diagnosis not present

## 2018-07-14 DIAGNOSIS — Z87891 Personal history of nicotine dependence: Secondary | ICD-10-CM

## 2018-07-14 DIAGNOSIS — T451X5A Adverse effect of antineoplastic and immunosuppressive drugs, initial encounter: Secondary | ICD-10-CM | POA: Diagnosis present

## 2018-07-14 DIAGNOSIS — Z923 Personal history of irradiation: Secondary | ICD-10-CM

## 2018-07-14 DIAGNOSIS — E538 Deficiency of other specified B group vitamins: Secondary | ICD-10-CM | POA: Diagnosis present

## 2018-07-14 DIAGNOSIS — T380X5A Adverse effect of glucocorticoids and synthetic analogues, initial encounter: Secondary | ICD-10-CM | POA: Diagnosis present

## 2018-07-14 DIAGNOSIS — C7800 Secondary malignant neoplasm of unspecified lung: Secondary | ICD-10-CM | POA: Diagnosis present

## 2018-07-14 DIAGNOSIS — E785 Hyperlipidemia, unspecified: Secondary | ICD-10-CM | POA: Diagnosis present

## 2018-07-14 DIAGNOSIS — E039 Hypothyroidism, unspecified: Secondary | ICD-10-CM | POA: Diagnosis present

## 2018-07-14 DIAGNOSIS — G620 Drug-induced polyneuropathy: Secondary | ICD-10-CM | POA: Diagnosis present

## 2018-07-14 DIAGNOSIS — I82451 Acute embolism and thrombosis of right peroneal vein: Secondary | ICD-10-CM | POA: Diagnosis present

## 2018-07-14 DIAGNOSIS — C541 Malignant neoplasm of endometrium: Secondary | ICD-10-CM | POA: Diagnosis present

## 2018-07-14 DIAGNOSIS — Z79899 Other long term (current) drug therapy: Secondary | ICD-10-CM

## 2018-07-14 DIAGNOSIS — I2699 Other pulmonary embolism without acute cor pulmonale: Secondary | ICD-10-CM | POA: Diagnosis present

## 2018-07-14 DIAGNOSIS — G893 Neoplasm related pain (acute) (chronic): Secondary | ICD-10-CM | POA: Diagnosis present

## 2018-07-14 DIAGNOSIS — R55 Syncope and collapse: Secondary | ICD-10-CM | POA: Diagnosis present

## 2018-07-14 DIAGNOSIS — I10 Essential (primary) hypertension: Secondary | ICD-10-CM | POA: Diagnosis present

## 2018-07-14 DIAGNOSIS — Z90710 Acquired absence of both cervix and uterus: Secondary | ICD-10-CM

## 2018-07-14 DIAGNOSIS — N1330 Unspecified hydronephrosis: Secondary | ICD-10-CM | POA: Diagnosis not present

## 2018-07-14 LAB — CBC
HEMATOCRIT: 31.1 % — AB (ref 36.0–46.0)
Hemoglobin: 9.5 g/dL — ABNORMAL LOW (ref 12.0–15.0)
MCH: 26.2 pg (ref 26.0–34.0)
MCHC: 30.5 g/dL (ref 30.0–36.0)
MCV: 85.9 fL (ref 80.0–100.0)
NRBC: 0 % (ref 0.0–0.2)
PLATELETS: 206 10*3/uL (ref 150–400)
RBC: 3.62 MIL/uL — ABNORMAL LOW (ref 3.87–5.11)
RDW: 16.4 % — AB (ref 11.5–15.5)
WBC: 5.1 10*3/uL (ref 4.0–10.5)

## 2018-07-14 LAB — HEPATIC FUNCTION PANEL
ALBUMIN: 3.1 g/dL — AB (ref 3.5–5.0)
ALT: 17 U/L (ref 0–44)
AST: 32 U/L (ref 15–41)
Alkaline Phosphatase: 58 U/L (ref 38–126)
Bilirubin, Direct: <0.1 mg/dL (ref 0.0–0.2)
TOTAL PROTEIN: 5.9 g/dL — AB (ref 6.5–8.1)
Total Bilirubin: 0.5 mg/dL (ref 0.3–1.2)

## 2018-07-14 LAB — URINALYSIS, ROUTINE W REFLEX MICROSCOPIC
Bilirubin Urine: NEGATIVE
Glucose, UA: NEGATIVE mg/dL
Hgb urine dipstick: NEGATIVE
KETONES UR: NEGATIVE mg/dL
LEUKOCYTES UA: NEGATIVE
Nitrite: NEGATIVE
PROTEIN: NEGATIVE mg/dL
Specific Gravity, Urine: 1.014 (ref 1.005–1.030)
pH: 6 (ref 5.0–8.0)

## 2018-07-14 LAB — BASIC METABOLIC PANEL
Anion gap: 11 (ref 5–15)
BUN: 29 mg/dL — ABNORMAL HIGH (ref 8–23)
CALCIUM: 8.7 mg/dL — AB (ref 8.9–10.3)
CHLORIDE: 102 mmol/L (ref 98–111)
CO2: 25 mmol/L (ref 22–32)
CREATININE: 1.24 mg/dL — AB (ref 0.44–1.00)
GFR calc Af Amer: 46 mL/min — ABNORMAL LOW (ref >60)
GFR calc non Af Amer: 40 mL/min — ABNORMAL LOW (ref >60)
Glucose, Bld: 119 mg/dL — ABNORMAL HIGH (ref 70–99)
Potassium: 4.3 mmol/L (ref 3.5–5.1)
Sodium: 138 mmol/L (ref 135–145)

## 2018-07-14 LAB — TROPONIN I
TROPONIN I: 0.72 ng/mL — AB (ref <0.03)
Troponin I: 0.78 ng/mL (ref <0.03)

## 2018-07-14 LAB — PROTIME-INR
INR: 1.06
PROTHROMBIN TIME: 13.7 s (ref 11.4–15.2)

## 2018-07-14 LAB — LIPASE, BLOOD: LIPASE: 35 U/L (ref 11–51)

## 2018-07-14 LAB — APTT: APTT: 25 s (ref 24–36)

## 2018-07-14 LAB — CBG MONITORING, ED: GLUCOSE-CAPILLARY: 115 mg/dL — AB (ref 70–99)

## 2018-07-14 MED ORDER — ACETAMINOPHEN 650 MG RE SUPP: 650.0000 mg | Freq: Four times a day (QID) | RECTAL | Status: DC | PRN

## 2018-07-14 MED ORDER — IOPAMIDOL (ISOVUE-370) INJECTION 76%
100.0000 mL | Freq: Once | INTRAVENOUS | Status: AC | PRN
  Administered 2018-07-14: 80 mL via INTRAVENOUS

## 2018-07-14 MED ORDER — DRONABINOL 2.5 MG PO CAPS
2.5000 mg | ORAL_CAPSULE | Freq: Two times a day (BID) | ORAL | Status: DC
  Administered 2018-07-15 – 2018-07-17 (×4): 2.5 mg via ORAL
  Filled 2018-07-14 (×4): qty 1

## 2018-07-14 MED ORDER — HEPARIN SOD (PORK) LOCK FLUSH 100 UNIT/ML IV SOLN
500.0000 [IU] | Freq: Once | INTRAVENOUS | Status: DC | PRN
  Filled 2018-07-14: qty 5

## 2018-07-14 MED ORDER — IOPAMIDOL (ISOVUE-370) INJECTION 76%
INTRAVENOUS | Status: AC
  Filled 2018-07-14: qty 100

## 2018-07-14 MED ORDER — METOCLOPRAMIDE HCL 10 MG PO TABS: 10.0000 mg | ORAL_TABLET | Freq: Three times a day (TID) | ORAL | Status: DC

## 2018-07-14 MED ORDER — LEVOTHYROXINE SODIUM 88 MCG PO TABS
88.0000 ug | ORAL_TABLET | Freq: Every day | ORAL | Status: DC
  Administered 2018-07-15 – 2018-07-17 (×3): 88 ug via ORAL
  Filled 2018-07-14 (×3): qty 1

## 2018-07-14 MED ORDER — DEXAMETHASONE 4 MG PO TABS
4.0000 mg | ORAL_TABLET | Freq: Every day | ORAL | Status: DC
  Administered 2018-07-15 – 2018-07-17 (×3): 4 mg via ORAL
  Filled 2018-07-14 (×3): qty 1

## 2018-07-14 MED ORDER — POLYVINYL ALCOHOL 1.4 % OP SOLN
1.0000 [drp] | Freq: Every day | OPHTHALMIC | Status: DC | PRN
  Filled 2018-07-14: qty 15

## 2018-07-14 MED ORDER — ENOXAPARIN SODIUM 80 MG/0.8ML ~~LOC~~ SOLN
80.0000 mg | Freq: Two times a day (BID) | SUBCUTANEOUS | Status: DC
  Filled 2018-07-14: qty 0.8

## 2018-07-14 MED ORDER — ACETAMINOPHEN 325 MG PO TABS: 650.0000 mg | ORAL_TABLET | Freq: Four times a day (QID) | ORAL | Status: DC | PRN

## 2018-07-14 MED ORDER — FLUTICASONE PROPIONATE 50 MCG/ACT NA SUSP
1.0000 | Freq: Two times a day (BID) | NASAL | Status: DC
  Administered 2018-07-15 – 2018-07-17 (×5): 1 via NASAL
  Filled 2018-07-14: qty 16

## 2018-07-14 MED ORDER — LACTATED RINGERS IV BOLUS
1000.0000 mL | Freq: Once | INTRAVENOUS | Status: AC
  Administered 2018-07-14: 1000 mL via INTRAVENOUS

## 2018-07-14 MED ORDER — VITAMIN B-12 1000 MCG PO TABS
1000.0000 ug | ORAL_TABLET | Freq: Every day | ORAL | Status: DC
  Administered 2018-07-15 – 2018-07-17 (×3): 1000 ug via ORAL
  Filled 2018-07-14 (×3): qty 1

## 2018-07-14 MED ORDER — SODIUM CHLORIDE 0.9 % IV SOLN
Freq: Once | INTRAVENOUS | Status: AC
  Administered 2018-07-14: 19:00:00 via INTRAVENOUS

## 2018-07-14 MED ORDER — HYDROMORPHONE HCL 2 MG PO TABS: 2.0000 mg | ORAL_TABLET | ORAL | Status: DC | PRN

## 2018-07-14 MED ORDER — ONDANSETRON HCL 4 MG/2ML IJ SOLN: 4.0000 mg | Freq: Four times a day (QID) | INTRAMUSCULAR | Status: DC | PRN

## 2018-07-14 MED ORDER — HEPARIN (PORCINE) IN NACL 100-0.45 UNIT/ML-% IJ SOLN
1100.0000 [IU]/h | INTRAMUSCULAR | Status: DC
  Filled 2018-07-14: qty 250

## 2018-07-14 MED ORDER — ONDANSETRON HCL 4 MG PO TABS: 4.0000 mg | ORAL_TABLET | Freq: Four times a day (QID) | ORAL | Status: DC | PRN

## 2018-07-14 MED ORDER — HEPARIN BOLUS VIA INFUSION
3000.0000 [IU] | Freq: Once | INTRAVENOUS | Status: DC
  Filled 2018-07-14: qty 3000

## 2018-07-14 MED ORDER — DULOXETINE HCL 30 MG PO CPEP
30.0000 mg | ORAL_CAPSULE | Freq: Every day | ORAL | Status: DC
  Administered 2018-07-15 – 2018-07-16 (×3): 30 mg via ORAL
  Filled 2018-07-14 (×3): qty 1

## 2018-07-14 MED ORDER — POLYETHYLENE GLYCOL 3350 17 G PO PACK
17.0000 g | PACK | Freq: Every day | ORAL | Status: DC
  Administered 2018-07-17: 17 g via ORAL
  Filled 2018-07-14 (×2): qty 1

## 2018-07-14 MED ORDER — SODIUM CHLORIDE 0.9 % IJ SOLN
INTRAMUSCULAR | Status: AC
  Filled 2018-07-14: qty 50

## 2018-07-14 MED ORDER — METOCLOPRAMIDE HCL 10 MG PO TABS
10.0000 mg | ORAL_TABLET | Freq: Three times a day (TID) | ORAL | Status: DC
  Administered 2018-07-15 – 2018-07-16 (×5): 10 mg via ORAL
  Filled 2018-07-14 (×6): qty 1

## 2018-07-14 NOTE — ED Notes (Signed)
Bed: WA08 Expected date:  Expected time:  Means of arrival:  Comments: 83 yo f syncope

## 2018-07-14 NOTE — ED Triage Notes (Addendum)
Pt BIB EMS from [place] home with complaints of dizziness since chemo treatment on Monday. Pt ambulated to bathroom from home and became very dizzy and nauseas and passed out on her way. Pt reports having an episode of diarrhea and passed out again on her way from bathroom. Denies hitting head and no obvious injuries noted.   140/90 HR 100 RR 16 98% Temp 98.9

## 2018-07-14 NOTE — Progress Notes (Signed)
ANTICOAGULATION CONSULT NOTE - Initial Consult  Pharmacy Consult for Lovenox Indication: pulmonary embolus  Allergies  Allergen Reactions  . Levofloxacin Other (See Comments)    Unknown  . Promethazine Hcl Other (See Comments)    fainting  . Sulfa Antibiotics Swelling  . Sulfa Drugs Cross Reactors Swelling  . Sulfur Swelling    Patient Measurements: Height: 5\' 6"  (167.6 cm) Weight: 180 lb (81.6 kg) IBW/kg (Calculated) : 59.3 Heparin Dosing Weight: 66 kg  Vital Signs: Temp: 97.6 F (36.4 C) (10/16 1606) Temp Source: Oral (10/16 1606) BP: 149/92 (10/16 2100) Pulse Rate: 92 (10/16 2100)  Labs: Recent Labs    07/12/18 0927 07/14/18 1655 07/14/18 2005  HGB 10.3* 9.5*  --   HCT 32.3* 31.1*  --   PLT 252 206  --   CREATININE 1.19* 1.24*  --   TROPONINI  --  0.72* 0.78*    Estimated Creatinine Clearance: 38.3 mL/min (A) (by C-G formula based on SCr of 1.24 mg/dL (H)).   Medical History: Past Medical History:  Diagnosis Date  . Aortic atherosclerosis ([place])   . Arthritis   . Arthritis of knee, left   . Borderline osteopenia DEXA 2006 and 2012  . Cancer [facility])    endometrial  . Cataract   . Cholesterol serum elevated   . Colon polyps   . DDD (degenerative disc disease), lumbar   . DDD (degenerative disc disease), lumbar   . Diverticulitis of sigmoid colon    Moderate  . Facet arthropathy, lumbar   . FHx: colon cancer   . GERD (gastroesophageal reflux disease)   . History of hiatal hernia    Moderate  . History of radiation therapy 08/06/16 - 09/03/16   vaginal cuff treated to 30 Gy in 5 fractions  . Hypothyroidism   . Kidney lesion, native, right   . Left foot drop   . Leukopenia   . Neuropathy    chemo indiced ; in right hand and bilateral feet   . Obesity   . Pelvic abscess in female 01/2017   Right  . Spondylisthesis   . Umbilical hernia     Medications:  Scheduled:  . iopamidol      . sodium chloride       Infusions:    Assessment: 83  yoF c/o dizziness since chemo on 10/14. CT reveals bilateral PE with right heart strain. Lovenox per Rx.  Baseline labs: H/H=9.5/31.1, Plts=206, coags pending.     Plan:  Baseline aptt and PT/INR STAT Lovenox 80 mg sq q12h F/u scr  Lawana Pai R 07/14/2018,10:44 PM

## 2018-07-14 NOTE — ED Provider Notes (Signed)
Emergency Department Provider Note   I have reviewed the triage vital signs and the nursing notes.   HISTORY  Chief Complaint Weakness and Near Syncope   HPI Kayla Guerrero is a 83 y.o. female with PMH of HLD, DDD, metastatic endometrial cancer currently on chemotherapy, and hypothyroidism presents to the emergency department after 2 syncopal events today.  The patient states that she got up from bed feeling like she needed to urinate.  She went to the bathroom without difficulty and then upon returning back to bed she felt lightheaded and then states she remembers waking up on the floor.  She does not remember falling or head trauma.  She reports some mild shortness of breath but denies chest pain, heart palpitations, diaphoresis, or sudden flush sensation.  She got back in bed and then had a second event that was witnessed by her husband.  He describes the patient "going stiff" for approximately 30 seconds and then coming awake and alert.  Husband states that he grabbed her and try to put her head on the pillow but had difficulty because of her stiffness.  She was not confused afterwards.  She has no history of seizure.    Past Medical History:  Diagnosis Date  . Aortic atherosclerosis ([place])   . Arthritis   . Arthritis of knee, left   . Borderline osteopenia DEXA 2006 and 2012  . Cancer [facility])    endometrial  . Cataract   . Cholesterol serum elevated   . Colon polyps   . DDD (degenerative disc disease), lumbar   . DDD (degenerative disc disease), lumbar   . Diverticulitis of sigmoid colon    Moderate  . Facet arthropathy, lumbar   . FHx: colon cancer   . GERD (gastroesophageal reflux disease)   . History of hiatal hernia    Moderate  . History of radiation therapy 08/06/16 - 09/03/16   vaginal cuff treated to 30 Gy in 5 fractions  . Hypothyroidism   . Kidney lesion, native, right   . Left foot drop   . Leukopenia   . Neuropathy    chemo indiced ; in right hand and  bilateral feet   . Obesity   . Pelvic abscess in female 01/2017   Right  . Spondylisthesis   . Umbilical hernia     Patient Active Problem List   Diagnosis Date Noted  . B12 deficiency 07/12/2018  . Sinus congestion 07/02/2018  . Nausea without vomiting 07/02/2018  . Congestion of nasal sinus 07/02/2018  . Metastasis to lung ([place]) 06/14/2018  . Metastasis to liver ([place]) 06/14/2018  . Peritoneal metastases ([place]) 06/14/2018  . Cancer associated pain 06/14/2018  . Goals of care, counseling/discussion 06/14/2018  . Hydronephrosis of right kidney 06/14/2018  . Preventive measure 06/14/2018  . OA (osteoarthritis) of knee 04/12/2018  . Leukopenia 03/04/2018  . Sepsis ([place]) 02/17/2017  . Anemia 02/17/2017  . Hyponatremia 02/17/2017  . Hypothyroidism 02/17/2017  . Kidney lesion, native, right 01/08/2017  . Left foot drop 01/08/2017  . Leukopenia due to antineoplastic chemotherapy ([place]) 09/29/2016  . Pain in both lower extremities 09/29/2016  . Pelvic fluid collection 09/29/2016  . Port-A-Cath in place 08/27/2016  . Syncope 08/10/2016  . Sympathotonic orthostatic hypotension 08/02/2016  . Chemotherapy-induced peripheral neuropathy ([place]) 07/12/2016  . Chemotherapy induced nausea and vomiting 07/12/2016  . Chemotherapy-induced neuropathy ([place]) 07/02/2016  . Chemotherapy induced neutropenia ([place]) 06/20/2016  . Poor venous access 06/20/2016  . Osteoarthritis of multiple joints 06/06/2016  .  Neuropathy 06/06/2016  . Acute postoperative pain of left groin 05/16/2016  . Malignant neoplasm of endometrium ([place]) 04/23/2016  . GERD (gastroesophageal reflux disease) 04/06/2014  . Arthritis of knee 10/06/2013  . Macular degeneration 12/19/2012  . Left knee pain 09/15/2012  . Osteopenia 09/15/2012  . Pure hypercholesterolemia 06/11/2011  . Thyroid activity decreased 06/11/2011    Past Surgical History:  Procedure Laterality Date  . CATARACT EXTRACTION, BILATERAL    . CHOLECYSTECTOMY     . COLONOSCOPY  2010, 04/2013   Buccini  . IR GENERIC HISTORICAL  08/11/2016   IR FLUORO GUIDE PORT INSERTION RIGHT 08/11/2016 Greggory Keen, MD [facility]  . IR GENERIC HISTORICAL  08/11/2016   IR US GUIDE VASC ACCESS RIGHT 08/11/2016 Greggory Keen, MD [facility]  . IR IMAGING GUIDED PORT INSERTION  06/25/2018  . IR RADIOLOGIST EVAL & MGMT  02/25/2017  . IR RADIOLOGIST EVAL & MGMT  03/11/2017  . IR REMOVAL TUN ACCESS W/ PORT W/O FL MOD SED  03/23/2018  . PTERYGIUM EXCISION    . ROBOTIC ASSISTED TOTAL HYSTERECTOMY WITH BILATERAL SALPINGO OOPHERECTOMY N/A 05/06/2016   Procedure: XI ROBOTIC ASSISTED  LAPAROSCOPIC TOTAL HYSTERECTOMY WITH BILATERAL SALPINGO OOPHORECTOMY;  Surgeon: Nancy Marus, MD;  Location: [facility];  Service: Gynecology;  Laterality: N/A;  . SENTINEL NODE BIOPSY N/A 05/06/2016   Procedure: SENTINEL NODE BIOPSY;  Surgeon: Nancy Marus, MD;  Location: [facility];  Service: Gynecology;  Laterality: N/A;  . TOTAL KNEE ARTHROPLASTY Left 04/12/2018   Procedure: LEFT TOTAL KNEE ARTHROPLASTY;  Surgeon: Gaynelle Arabian, MD;  Location: [facility];  Service: Orthopedics;  Laterality: Left;  . TUBAL LIGATION      Allergies Levofloxacin; Promethazine hcl; Sulfa antibiotics; Sulfa drugs cross reactors; and Sulfur  Family History  Problem Relation Age of Onset  . Cancer Mother 10       COLON   . Heart disease Mother   . Cancer Maternal Aunt   . Breast cancer Maternal Aunt   . Mental illness Maternal Uncle   . Cancer Maternal Grandmother        stomach? more likely uterine  . Macular degeneration Maternal Grandmother   . Diabetes Neg Hx     Social History Social History   Tobacco Use  . Smoking status: Former Smoker    Packs/day: 1.00    Types: Cigarettes    Last attempt to quit: 09/29/1980    Years since quitting: 37.8  . Smokeless tobacco: Never Used  Substance Use Topics  . Alcohol use: Yes    Alcohol/week: 0.0 standard drinks    Comment: 1 glass of wine 3-4 times/week; hard  liquor (highball) or a beer once a week  . Drug use: No    Review of Systems  Constitutional: No fever/chills  Eyes: No visual changes. ENT: No sore throat. Cardiovascular: Denies chest pain. Positive syncope.  Respiratory: Denies shortness of breath. Gastrointestinal: No abdominal pain.  No nausea, no vomiting. Positive diarrhea.  No constipation. Genitourinary: Negative for dysuria. Musculoskeletal: Negative for back pain. Skin: Negative for rash. Neurological: Negative for headaches, focal weakness or numbness.  10-point ROS otherwise negative.  ____________________________________________   PHYSICAL EXAM:  VITAL SIGNS: ED Triage Vitals  Enc Vitals Group     BP 07/14/18 1606 (!) 146/98     Pulse Rate 07/14/18 1606 86     Resp 07/14/18 1606 14     Temp 07/14/18 1606 97.6 F (36.4 C)     Temp Source 07/14/18 1606 Oral  SpO2 07/14/18 1606 95 %     Weight 07/14/18 1606 180 lb (81.6 kg)     Height 07/14/18 1606 5\' 6"  (1.676 m)     Pain Score 07/14/18 1617 0   Constitutional: Alert and oriented. Well appearing and in no acute distress. Eyes: Conjunctivae are normal. PERRL.  Head: Atraumatic. Nose: No congestion/rhinnorhea. Mouth/Throat: Mucous membranes are slightly dry.  Neck: No stridor. No cervical spine tenderness to palpation. Cardiovascular: Normal rate, regular rhythm. Good peripheral circulation. Grossly normal heart sounds.   Respiratory: Normal respiratory effort.  No retractions. Lungs CTAB. Gastrointestinal: Soft and nontender. No distention.  Musculoskeletal: No lower extremity tenderness nor edema. No gross deformities of extremities. Neurologic:  Normal speech and language. No gross focal neurologic deficits are appreciated.  Skin:  Skin is warm, dry and intact. No rash noted.  ____________________________________________   LABS (all labs ordered are listed, but only abnormal results are displayed)  Labs Reviewed  BASIC METABOLIC PANEL -  Abnormal; Notable for the following components:      Result Value   Glucose, Bld 119 (*)    BUN 29 (*)    Creatinine, Ser 1.24 (*)    Calcium 8.7 (*)    GFR calc non Af Amer 40 (*)    GFR calc Af Amer 46 (*)    All other components within normal limits  CBC - Abnormal; Notable for the following components:   RBC 3.62 (*)    Hemoglobin 9.5 (*)    HCT 31.1 (*)    RDW 16.4 (*)    All other components within normal limits  HEPATIC FUNCTION PANEL - Abnormal; Notable for the following components:   Total Protein 5.9 (*)    Albumin 3.1 (*)    All other components within normal limits  TROPONIN I - Abnormal; Notable for the following components:   Troponin I 0.72 (*)    All other components within normal limits  TROPONIN I - Abnormal; Notable for the following components:   Troponin I 0.78 (*)    All other components within normal limits  CBG MONITORING, ED - Abnormal; Notable for the following components:   Glucose-Capillary 115 (*)    All other components within normal limits  URINE CULTURE  URINALYSIS, ROUTINE W REFLEX MICROSCOPIC  LIPASE, BLOOD   ____________________________________________  EKG   EKG Interpretation  Date/Time:  Wednesday July 14 2018 16:13:34 EDT Ventricular Rate:  89 PR Interval:    QRS Duration: 103 QT Interval:  385 QTC Calculation: 469 R Axis:   87 Text Interpretation:  Sinus rhythm Borderline right axis deviation ST changes similar to prior.  No STEMI  Confirmed by Nanda Quinton [phone]) on 07/14/2018 4:26:29 PM       ____________________________________________  RADIOLOGY  Dg Chest 2 View  Result Date: 07/14/2018 CLINICAL DATA:  Dizziness after chemotherapy. EXAM: CHEST - 2 VIEW COMPARISON:  Chest CT 06/11/2018 FINDINGS: Heart size is borderline enlarged. There is aortic atherosclerosis. Port catheter tip terminates in the distal SVC. Rounded posterior pleural based mass in the right lower lobe measuring 7.8 cm in diameter, previously  noted on prior CT is without significant change allowing for differences in technique. The smaller pulmonary nodules better visualized on CT are not well visualized radiographically. No aggressive osseous lesions. No pulmonary consolidation, effusion or edema. IMPRESSION: 1. Pleural-based right lower lobe mass measuring 7.8 cm in diameter is redemonstrated. The small pulmonary masses and nodules are not well visualized radiographically nor are the mildly enlarged mediastinal lymph  nodes described on CT. 2. No acute pulmonary consolidation, effusion or pneumothorax. 3. Borderline cardiomegaly with aortic atherosclerosis. Electronically Signed   By: Ashley Royalty M.D.   On: 07/14/2018 18:55   Ct Head Wo Contrast  Result Date: 07/14/2018 CLINICAL DATA:  Dizziness after chemotherapy. EXAM: CT HEAD WITHOUT CONTRAST TECHNIQUE: Contiguous axial images were obtained from the base of the skull through the vertex without intravenous contrast. COMPARISON:  None. FINDINGS: Brain: Chronic age related involutional changes of the brain. Likely chronic minimal small vessel ischemic disease of periventricular white matter. No definite intra-axial mass given limitations of a noncontrast study. No evidence of vasogenic edema or midline shift. No extra-axial fluid collections. No hydrocephalus. Midline fourth ventricle basal cisterns. Brainstem and cerebellum are nonacute in appearance. Vascular: No hyperdense vessel sign. Atherosclerosis of the carotid siphons bilaterally. Skull: Negative for fracture or focal lesions. Sinuses/Orbits: No acute finding. Other: None. IMPRESSION: Mild involutional changes of the brain with chronic appearing small vessel ischemic disease. No acute intracranial abnormality. Electronically Signed   By: Ashley Royalty M.D.   On: 07/14/2018 18:58   Ct Angio Chest Pe W And/or Wo Contrast  Result Date: 07/14/2018 CLINICAL DATA:  Syncope this morning with dyspnea for several weeks. EXAM: CT ANGIOGRAPHY  CHEST WITH CONTRAST TECHNIQUE: Multidetector CT imaging of the chest was performed using the standard protocol during bolus administration of intravenous contrast. Multiplanar CT image reconstructions and MIPs were obtained to evaluate the vascular anatomy. CONTRAST:  70mL ISOVUE-370 IOPAMIDOL (ISOVUE-370) INJECTION 76% COMPARISON:  CXR 07/14/2018, chest CT 06/11/2018 FINDINGS: Cardiovascular: Conventional branch pattern of the great vessels with atherosclerotic origins. Nonaneurysmal thoracic aorta to 3.5 cm. Satisfactory opacification of the pulmonary arteries with bilateral pulmonary artery starting from the distal most aspect of the main right and left pulmonary arteries extending into the lobar and segmental branches bilaterally. RV/LV ratio approximately 1.33 consistent with right heart failure. Heart size is enlarged. There left main coronary arteriosclerosis. Heart size is normal without pericardial effusion or thickening. Mediastinum/Nodes: Mediastinal lymphadenopathy is redemonstrated with a 1.4 cm short axis lymph node in the right upper paratracheal portion versus 1.5 cm previously. Additional right lower paratracheal/precarinal lymph node measuring 1.7 cm short axis unchanged in appearance, series 4/39 versus series 2/28. No hilar lymphadenopathy. Moderate hiatal hernia. No thyromegaly or mass. The esophagus is unremarkable. Patent midline trachea and mainstem bronchi. Lungs/Pleura: Interval increase in size of dominant mass, pleural based in the posterior right lower lobe measuring 7.8 x 5.8 cm presently versus to 7.3 cm previously. New areas of necrosis are identified centrally with mottled foci of gas seen within. Trace pleural effusions are present. Additional pulmonary nodules are identified as follows: 1. Left upper lobe pleural-based 11 mm nodule versus 10 mm previously, series 10/31 versus series 6/38. 2. Left upper lobe 4 mm centrally located pulmonary nodule, series 10/40 versus 6 mm  previously series 6/48. 3. Left upper lobe cavitary nodule measuring 1.4 cm, series 10/52 versus 1.5 cm previously on series 6/63. 4. Left upper lobe 10 mm nodule abutting the major fissure, series 10/58 unchanged. 5. Right upper lobe posterior 18 mm nodule versus 21 mm previously, series 10/41 versus series 6/46. 6. Right upper lobe subpleural lateral nodule measuring 4 mm unchanged in appearance, series 10/61. 7. Right upper lobe posterior 6 mm cavitary nodule unchanged in appearance, series 10/64. 8. Right lower lobe partially cavitary 4 mm nodule adjacent to the major fissure, unchanged, series 10/93. Upper Abdomen: Partially included markedly dilated appearing right renal collecting system  with cortical thinning and mild-to-moderate dilatation of the left renal collecting system. Subtle hypodensity in the right hepatic lobe measuring 12 mm in diameter, series 4/75, similar in appearance bed nonspecific. Metastatic focus is not excluded. Cholecystectomy is seen. Musculoskeletal: No aggressive osseous lesions. Review of the MIP images confirms the above findings. IMPRESSION: 1. Positive for acute bilateral PE with CT evidence of right heart strain (RV/LV Ratio = 1.33) consistent with at least submassive (intermediate risk) PE. The presence of right heart strain has been associated with an increased risk of morbidity and mortality. Please activate Code PE by paging [phone]. 2. Interval increase in size of pleural-based hypodense pulmonary mass in the right lower lobe now with central areas of necrosis demonstrated. 3. Additional bilateral pulmonary nodules as above described with equivocal overall change some which appear to have decreased in size and is slightly increased. 4. Redemonstration of mediastinal lymphadenopathy also without significant progression. These results were called by telephone at the time of interpretation on 07/14/2018 at 10:28 pm to Dr. Nanda Quinton , who verbally acknowledged these  results. Aortic Atherosclerosis (ICD10-I70.0). Electronically Signed   By: Ashley Royalty M.D.   On: 07/14/2018 22:28    ____________________________________________   PROCEDURES  Procedure(s) performed:   .Critical Care Performed by: Margette Fast, MD Authorized by: Margette Fast, MD   Critical care provider statement:    Critical care time (minutes):  35   Critical care time was exclusive of:  Separately billable procedures and treating other patients and teaching time   Critical care was necessary to treat or prevent imminent or life-threatening deterioration of the following conditions:  Circulatory failure   Critical care was time spent personally by me on the following activities:  Blood draw for specimens, development of treatment plan with patient or surrogate, discussions with consultants, evaluation of patient's response to treatment, examination of patient, obtaining history from patient or surrogate, ordering and performing treatments and interventions, ordering and review of laboratory studies, ordering and review of radiographic studies, pulse oximetry, re-evaluation of patient's condition and review of old charts   I assumed direction of critical care for this patient from another provider in my specialty: no    ____________________________________________   INITIAL IMPRESSION / [place] / ED COURSE  Pertinent labs & imaging results that were available during my care of the patient were reviewed by me and considered in my medical decision making (see chart for details).  Patient presents to the emergency department with lightheadedness and 2 episodes of what appears to be syncope.  The husband does give report of some stiffness during the second episode but no postictal period.  Stigmata of seizure.  She is dealing with metastatic endometrial cancer at this time and currently on chemotherapy.  Plan for CT imaging of the head with unknown head trauma during the  first incident and attempt to rule out large intracranial mass that may divide a seizure focus.  Patient has no neurological deficits on my exam.  She does appear moderately dehydrated so will give 1 L LR bolus.   Troponin is elevated. Doubt ACS as primary issue but increased suspicion for cardiogenic syncope either from arrhythmia or PE. Send CTA. Waiting on CXR and CT head.   10:36 PM Spoke with Radiology regarding the large PE with right heart strain. Consulted critical care who will consult on the patient in the ED. Vitals remain stable. Patient on room air. Starting heparin. Will admit to the hospitalist.   Discussed  patient's case with Hospitalist, Dr. Alcario Drought to request admission. Patient and family (if present) updated with plan. Care transferred to [facility] service.  I reviewed all nursing notes, vitals, pertinent old records, EKGs, labs, imaging (as available).  ____________________________________________  FINAL CLINICAL IMPRESSION(S) / ED DIAGNOSES  Final diagnoses:  Other acute pulmonary embolism, unspecified whether acute cor pulmonale present ([place])  Syncope and collapse    MEDICATIONS GIVEN DURING THIS VISIT:  Medications  heparin lock flush 100 unit/mL (has no administration in time range)  iopamidol (ISOVUE-370) 76 % injection (has no administration in time range)  sodium chloride 0.9 % injection (has no administration in time range)  lactated ringers bolus 1,000 mL (0 mLs Intravenous Stopped 07/14/18 1849)  0.9 %  sodium chloride infusion ( Intravenous New Bag/Given 07/14/18 1850)  iopamidol (ISOVUE-370) 76 % injection 100 mL (80 mLs Intravenous Contrast Given 07/14/18 2137)    Note:  This document was prepared using Dragon voice recognition software and may include unintentional dictation errors.  Nanda Quinton, MD Emergency Medicine    Long, [facility], MD 07/14/18 2256

## 2018-07-14 NOTE — H&P (Signed)
History and Physical    Kayla Guerrero ZSW:[id number] DOB: [birth year] DOA: 07/14/2018  PCP: Rita Ohara, MD  Patient coming from: Home  I have personally briefly reviewed patient's old medical records in [place]  Chief Complaint: Syncope  HPI: Kayla Guerrero is a 83 y.o. female with medical history significant of metastatic endometrial CA on chemo, hypothyroidism.  Patient presents to the ED after 2 syncopal events today.  First occurred after going to bathroom and returning to bed, woke up on floor, mild SOB no CP.  Second event occurred after getting back in bed, witnessed by husband, lasted ~30 sec: patient "going stiff" for approximately 30 seconds and then coming awake and alert.  Husband states that he grabbed her and try to put her head on the pillow but had difficulty because of her stiffness.  She was not confused afterwards.  She has no history of seizure.    ED Course: Trop 0.73, CTA demonstrates large submassive PEs   Review of Systems: As per HPI otherwise 10 point review of systems negative.   Past Medical History:  Diagnosis Date  . Aortic atherosclerosis ([place])   . Arthritis   . Arthritis of knee, left   . Borderline osteopenia DEXA 2006 and 2012  . Cancer [facility])    endometrial  . Cataract   . Cholesterol serum elevated   . Colon polyps   . DDD (degenerative disc disease), lumbar   . DDD (degenerative disc disease), lumbar   . Diverticulitis of sigmoid colon    Moderate  . Facet arthropathy, lumbar   . FHx: colon cancer   . GERD (gastroesophageal reflux disease)   . History of hiatal hernia    Moderate  . History of radiation therapy 08/06/16 - 09/03/16   vaginal cuff treated to 30 Gy in 5 fractions  . Hypothyroidism   . Kidney lesion, native, right   . Left foot drop   . Leukopenia   . Neuropathy    chemo indiced ; in right hand and bilateral feet   . Obesity   . Pelvic abscess in female 01/2017   Right  . Spondylisthesis   . Umbilical  hernia     Past Surgical History:  Procedure Laterality Date  . CATARACT EXTRACTION, BILATERAL    . CHOLECYSTECTOMY    . COLONOSCOPY  2010, 04/2013   Buccini  . IR GENERIC HISTORICAL  08/11/2016   IR FLUORO GUIDE PORT INSERTION RIGHT 08/11/2016 Greggory Keen, MD [facility]INTER[facility]  . IR GENERIC HISTORICAL  08/11/2016   IR US GUIDE VASC ACCESS RIGHT 08/11/2016 Greggory Keen, MD [facility]  . IR IMAGING GUIDED PORT INSERTION  06/25/2018  . IR RADIOLOGIST EVAL & MGMT  02/25/2017  . IR RADIOLOGIST EVAL & MGMT  03/11/2017  . IR REMOVAL TUN ACCESS W/ PORT W/O FL MOD SED  03/23/2018  . PTERYGIUM EXCISION    . ROBOTIC ASSISTED TOTAL HYSTERECTOMY WITH BILATERAL SALPINGO OOPHERECTOMY N/A 05/06/2016   Procedure: XI ROBOTIC ASSISTED  LAPAROSCOPIC TOTAL HYSTERECTOMY WITH BILATERAL SALPINGO OOPHORECTOMY;  Surgeon: Nancy Marus, MD;  Location: [facility];  Service: Gynecology;  Laterality: N/A;  . SENTINEL NODE BIOPSY N/A 05/06/2016   Procedure: SENTINEL NODE BIOPSY;  Surgeon: Nancy Marus, MD;  Location: [facility];  Service: Gynecology;  Laterality: N/A;  . TOTAL KNEE ARTHROPLASTY Left 04/12/2018   Procedure: LEFT TOTAL KNEE ARTHROPLASTY;  Surgeon: Gaynelle Arabian, MD;  Location: [facility];  Service: Orthopedics;  Laterality: Left;  . TUBAL LIGATION  reports that she quit smoking about 37 years ago. Her smoking use included cigarettes. She smoked 1.00 pack per day. She has never used smokeless tobacco. She reports that she drinks alcohol. She reports that she does not use drugs.  Allergies  Allergen Reactions  . Levofloxacin Other (See Comments)    Unknown  . Promethazine Hcl Other (See Comments)    fainting  . Sulfa Antibiotics Swelling  . Sulfa Drugs Cross Reactors Swelling  . Sulfur Swelling    Family History  Problem Relation Age of Onset  . Cancer Mother 41       COLON   . Heart disease Mother   . Cancer Maternal Aunt   . Breast cancer Maternal Aunt   . Mental illness Maternal Uncle   . Cancer  Maternal Grandmother        stomach? more likely uterine  . Macular degeneration Maternal Grandmother   . Diabetes Neg Hx      Prior to Admission medications   Medication Sig Start Date End Date Taking? Authorizing Provider  dexamethasone (DECADRON) 4 MG tablet Take 1 tablet (4 mg total) by mouth daily. Patient taking differently: Take 4 mg by mouth daily. Pt takes each morning 07/12/18  Yes Gorsuch, Ni, MD  dronabinol (MARINOL) 2.5 MG capsule Take 1 capsule (2.5 mg total) by mouth 2 (two) times daily before lunch and supper. 07/12/18  Yes Gorsuch, Ni, MD  DULoxetine (CYMBALTA) 30 MG capsule Take 30 mg by mouth at bedtime. 12/30/17  Yes [provider]  levothyroxine (SYNTHROID, LEVOTHROID) 88 MCG tablet TAKE ONE TABLET BY MOUTH ONCE DAILY BEFORE  BREAKFAST. 07/12/18  Yes Rita Ohara, MD  lidocaine-prilocaine (EMLA) cream Apply 1 application topically as needed. 06/21/18  Yes Gorsuch, Ni, MD  Melatonin 5 MG CAPS Take 5 mg by mouth at bedtime.    Yes [provider]  metoCLOPramide (REGLAN) 10 MG tablet Take 1 tablet (10 mg total) by mouth 3 (three) times daily before meals. 07/12/18  Yes Gorsuch, Ni, MD  ondansetron (ZOFRAN) 8 MG tablet Take 1 tablet (8 mg total) by mouth every 8 (eight) hours as needed for nausea. 06/14/18  Yes Gorsuch, Ernst Spell, MD  Polyethyl Glycol-Propyl Glycol (SYSTANE ULTRA) 0.4-0.3 % SOLN Place 1 drop into both eyes daily as needed (for dry eyes).   Yes [provider]  polyethylene glycol (MIRALAX / GLYCOLAX) packet Take 17 g by mouth daily.    Yes [provider]  triamcinolone (NASACORT) 55 MCG/ACT AERO nasal inhaler Place 1 spray into the nose 2 (two) times daily. 07/02/18  Yes Gorsuch, Ni, MD  vitamin B-12 (CYANOCOBALAMIN) 1000 MCG tablet Take 1,000 mcg by mouth daily.   Yes [provider]  HYDROmorphone (DILAUDID) 2 MG tablet Take 1 tablet (2 mg total) by mouth every 4 (four) hours as needed for severe pain. 06/14/18   [name],  MD    Physical Exam: Vitals:   07/14/18 1606 07/14/18 1834 07/14/18 2054 07/14/18 2100  BP: (!) 146/98 (!) 142/96 (!) 151/96 (!) 149/92  Pulse: 86 91 93 92  Resp: 14 16 16    Temp: 97.6 F (36.4 C)     TempSrc: Oral     SpO2: 95% 96% 92% 93%  Weight: 81.6 kg     Height: 5\' 6"  (1.676 m)       Constitutional: NAD, calm, comfortable Eyes: PERRL, lids and conjunctivae normal ENMT: Mucous membranes are moist. Posterior pharynx clear of any exudate or lesions.Normal dentition.  Neck: normal, supple,  no masses, no thyromegaly Respiratory: clear to auscultation bilaterally, no wheezing, no crackles. Normal respiratory effort. No accessory muscle use.  Cardiovascular: Regular rate and rhythm, no murmurs / rubs / gallops. No extremity edema. 2+ pedal pulses. No carotid bruits.  Abdomen: no tenderness, no masses palpated. No hepatosplenomegaly. Bowel sounds positive.  Musculoskeletal: no clubbing / cyanosis. No joint deformity upper and lower extremities. Good ROM, no contractures. Normal muscle tone.  Skin: no rashes, lesions, ulcers. No induration Neurologic: CN 2-12 grossly intact. Sensation intact, DTR normal. Strength 5/5 in all 4.  Psychiatric: Normal judgment and insight. Alert and oriented x 3. Normal mood.    Labs on Admission: I have personally reviewed following labs and imaging studies  CBC: Recent Labs  Lab 07/12/18 0927 07/14/18 1655  WBC 4.1 5.1  NEUTROABS 3.1  --   HGB 10.3* 9.5*  HCT 32.3* 31.1*  MCV 83.7 85.9  PLT 252 536   Basic Metabolic Panel: Recent Labs  Lab 07/12/18 0927 07/14/18 1655  NA 141 138  K 3.7 4.3  CL 106 102  CO2 25 25  GLUCOSE 97 119*  BUN 23 29*  CREATININE 1.19* 1.24*  CALCIUM 9.1 8.7*   GFR: Estimated Creatinine Clearance: 38.3 mL/min (A) (by C-G formula based on SCr of 1.24 mg/dL (H)). Liver Function Tests: Recent Labs  Lab 07/12/18 0927 07/14/18 1655  AST 20 32  ALT 8 17  ALKPHOS 58 58  BILITOT 0.3 0.5  PROT 6.7 5.9*    ALBUMIN 3.6 3.1*   Recent Labs  Lab 07/14/18 1655  LIPASE 35   No results for input(s): AMMONIA in the last 168 hours. Coagulation Profile: Recent Labs  Lab 07/14/18 2240  INR 1.06   Cardiac Enzymes: Recent Labs  Lab 07/14/18 1655 07/14/18 2005  TROPONINI 0.72* 0.78*   BNP (last 3 results) No results for input(s): PROBNP in the last 8760 hours. HbA1C: No results for input(s): HGBA1C in the last 72 hours. CBG: Recent Labs  Lab 07/14/18 1629  GLUCAP 115*   Lipid Profile: No results for input(s): CHOL, HDL, LDLCALC, TRIG, CHOLHDL, LDLDIRECT in the last 72 hours. Thyroid Function Tests: No results for input(s): TSH, T4TOTAL, FREET4, T3FREE, THYROIDAB in the last 72 hours. Anemia Panel: Recent Labs    07/12/18 1155  VITAMINB12 253   Urine analysis:    Component Value Date/Time   COLORURINE YELLOW 07/14/2018 2004   APPEARANCEUR CLEAR 07/14/2018 2004   LABSPEC 1.014 07/14/2018 2004   LABSPEC 1.030 06/10/2018 1157   PHURINE 6.0 07/14/2018 2004   GLUCOSEU NEGATIVE 07/14/2018 2004   HGBUR NEGATIVE 07/14/2018 2004   BILIRUBINUR NEGATIVE 07/14/2018 2004   BILIRUBINUR negative 06/10/2018 1157   BILIRUBINUR neg 02/16/2017 [place] 07/14/2018 2004   PROTEINUR NEGATIVE 07/14/2018 2004   UROBILINOGEN 0.2 02/16/2017 1259   NITRITE NEGATIVE 07/14/2018 2004   LEUKOCYTESUR NEGATIVE 07/14/2018 2004    Radiological Exams on Admission: Dg Chest 2 View  Result Date: 07/14/2018 CLINICAL DATA:  Dizziness after chemotherapy. EXAM: CHEST - 2 VIEW COMPARISON:  Chest CT 06/11/2018 FINDINGS: Heart size is borderline enlarged. There is aortic atherosclerosis. Port catheter tip terminates in the distal SVC. Rounded posterior pleural based mass in the right lower lobe measuring 7.8 cm in diameter, previously noted on prior CT is without significant change allowing for differences in technique. The smaller pulmonary nodules better visualized on CT are not well  visualized radiographically. No aggressive osseous lesions. No pulmonary consolidation, effusion or edema. IMPRESSION: 1. Pleural-based  right lower lobe mass measuring 7.8 cm in diameter is redemonstrated. The small pulmonary masses and nodules are not well visualized radiographically nor are the mildly enlarged mediastinal lymph nodes described on CT. 2. No acute pulmonary consolidation, effusion or pneumothorax. 3. Borderline cardiomegaly with aortic atherosclerosis. Electronically Signed   By: Ashley Royalty M.D.   On: 07/14/2018 18:55   Ct Head Wo Contrast  Result Date: 07/14/2018 CLINICAL DATA:  Dizziness after chemotherapy. EXAM: CT HEAD WITHOUT CONTRAST TECHNIQUE: Contiguous axial images were obtained from the base of the skull through the vertex without intravenous contrast. COMPARISON:  None. FINDINGS: Brain: Chronic age related involutional changes of the brain. Likely chronic minimal small vessel ischemic disease of periventricular white matter. No definite intra-axial mass given limitations of a noncontrast study. No evidence of vasogenic edema or midline shift. No extra-axial fluid collections. No hydrocephalus. Midline fourth ventricle basal cisterns. Brainstem and cerebellum are nonacute in appearance. Vascular: No hyperdense vessel sign. Atherosclerosis of the carotid siphons bilaterally. Skull: Negative for fracture or focal lesions. Sinuses/Orbits: No acute finding. Other: None. IMPRESSION: Mild involutional changes of the brain with chronic appearing small vessel ischemic disease. No acute intracranial abnormality. Electronically Signed   By: Ashley Royalty M.D.   On: 07/14/2018 18:58   Ct Angio Chest Pe W And/or Wo Contrast  Result Date: 07/14/2018 CLINICAL DATA:  Syncope this morning with dyspnea for several weeks. EXAM: CT ANGIOGRAPHY CHEST WITH CONTRAST TECHNIQUE: Multidetector CT imaging of the chest was performed using the standard protocol during bolus administration of intravenous  contrast. Multiplanar CT image reconstructions and MIPs were obtained to evaluate the vascular anatomy. CONTRAST:  69mL ISOVUE-370 IOPAMIDOL (ISOVUE-370) INJECTION 76% COMPARISON:  CXR 07/14/2018, chest CT 06/11/2018 FINDINGS: Cardiovascular: Conventional branch pattern of the great vessels with atherosclerotic origins. Nonaneurysmal thoracic aorta to 3.5 cm. Satisfactory opacification of the pulmonary arteries with bilateral pulmonary artery starting from the distal most aspect of the main right and left pulmonary arteries extending into the lobar and segmental branches bilaterally. RV/LV ratio approximately 1.33 consistent with right heart failure. Heart size is enlarged. There left main coronary arteriosclerosis. Heart size is normal without pericardial effusion or thickening. Mediastinum/Nodes: Mediastinal lymphadenopathy is redemonstrated with a 1.4 cm short axis lymph node in the right upper paratracheal portion versus 1.5 cm previously. Additional right lower paratracheal/precarinal lymph node measuring 1.7 cm short axis unchanged in appearance, series 4/39 versus series 2/28. No hilar lymphadenopathy. Moderate hiatal hernia. No thyromegaly or mass. The esophagus is unremarkable. Patent midline trachea and mainstem bronchi. Lungs/Pleura: Interval increase in size of dominant mass, pleural based in the posterior right lower lobe measuring 7.8 x 5.8 cm presently versus to 7.3 cm previously. New areas of necrosis are identified centrally with mottled foci of gas seen within. Trace pleural effusions are present. Additional pulmonary nodules are identified as follows: 1. Left upper lobe pleural-based 11 mm nodule versus 10 mm previously, series 10/31 versus series 6/38. 2. Left upper lobe 4 mm centrally located pulmonary nodule, series 10/40 versus 6 mm previously series 6/48. 3. Left upper lobe cavitary nodule measuring 1.4 cm, series 10/52 versus 1.5 cm previously on series 6/63. 4. Left upper lobe 10 mm nodule  abutting the major fissure, series 10/58 unchanged. 5. Right upper lobe posterior 18 mm nodule versus 21 mm previously, series 10/41 versus series 6/46. 6. Right upper lobe subpleural lateral nodule measuring 4 mm unchanged in appearance, series 10/61. 7. Right upper lobe posterior 6 mm cavitary nodule unchanged in appearance, series  10/64. 8. Right lower lobe partially cavitary 4 mm nodule adjacent to the major fissure, unchanged, series 10/93. Upper Abdomen: Partially included markedly dilated appearing right renal collecting system with cortical thinning and mild-to-moderate dilatation of the left renal collecting system. Subtle hypodensity in the right hepatic lobe measuring 12 mm in diameter, series 4/75, similar in appearance bed nonspecific. Metastatic focus is not excluded. Cholecystectomy is seen. Musculoskeletal: No aggressive osseous lesions. Review of the MIP images confirms the above findings. IMPRESSION: 1. Positive for acute bilateral PE with CT evidence of right heart strain (RV/LV Ratio = 1.33) consistent with at least submassive (intermediate risk) PE. The presence of right heart strain has been associated with an increased risk of morbidity and mortality. Please activate Code PE by paging [phone]. 2. Interval increase in size of pleural-based hypodense pulmonary mass in the right lower lobe now with central areas of necrosis demonstrated. 3. Additional bilateral pulmonary nodules as above described with equivocal overall change some which appear to have decreased in size and is slightly increased. 4. Redemonstration of mediastinal lymphadenopathy also without significant progression. These results were called by telephone at the time of interpretation on 07/14/2018 at 10:28 pm to Dr. Nanda Quinton , who verbally acknowledged these results. Aortic Atherosclerosis (ICD10-I70.0). Electronically Signed   By: Ashley Royalty M.D.   On: 07/14/2018 22:28    EKG: Independently  reviewed.  Assessment/Plan Principal Problem:   Pulmonary embolism with acute cor pulmonale (HCC) Active Problems:   Malignant neoplasm of endometrium (HCC)   Metastasis to lung ([place])   Metastasis to liver [facility])   Peritoneal metastases ([place])    1. PE with R heart strain - 1. Heparin gtt - PCCM wanted heparin for tonight instead of lovenox in case patient needed procedure 2. Bed rest per PCCM recs 3. 2d echo 4. BLE venous duplex 2. Metastatic endometrial CA - 1. IP consult to oncology put into Epic for AM 2. Continue decadron  DVT prophylaxis: heparin gtt Code Status: Full Family Communication: Husband at bedside Disposition Plan: Home after admit Consults called: IP consult to oncology put into epic for AM Admission status: Admit to inpatient  Severity of Illness: The appropriate patient status for this patient is INPATIENT. Inpatient status is judged to be reasonable and necessary in order to provide the required intensity of service to ensure the patient's safety. The patient's presenting symptoms, physical exam findings, and initial radiographic and laboratory data in the context of their chronic comorbidities is felt to place them at high risk for further clinical deterioration. Furthermore, it is not anticipated that the patient will be medically stable for discharge from the hospital within 2 midnights of admission. The following factors support the patient status of inpatient.   " The patient's presenting symptoms include SOB, syncope x2 today " The initial radiographic and laboratory data are worrisome because of Positive troponins, submassive PE on CT scan! " The chronic co-morbidities include metastatic endometrial cancer.   * I certify that at the point of admission it is my clinical judgment that the patient will require inpatient hospital care spanning beyond 2 midnights from the point of admission due to high intensity of service, high risk for further deterioration  and high frequency of surveillance required.Etta Quill DO [facility] Pager [phone] Only works nights!  If 7AM-7PM, please contact the primary day team physician taking care of patient  [email or web address] Password [email or web address]  07/14/2018, 11:58 PM

## 2018-07-14 NOTE — Consult Note (Signed)
PULMONARY / CRITICAL CARE MEDICINE   NAME:  Kayla Guerrero, MRN:  [id number], DOB:  1935-06-26, LOS: 0 ADMISSION DATE:  07/14/2018, CONSULTATION DATE: 07/14/18 REFERRING MD: Dr Alcario Drought, CHIEF COMPLAINT: Syncope  BRIEF HISTORY:    Syncope x 2 today, found to have submassive PE  HISTORY OF PRESENT ILLNESS   83yoF with hx Endometrial cancer with peritoneal mets, s/p TAH/BSO (05/06/16) on chemotherapy; also with hx OA, GERD, Hypothyroidism, and recent knee arthroplasty (04/12/18), who presents from home following 2 syncopal events. She had gotten up from bed and ambulated to the bathroom. On the way back from the bathroom she had nausea, dizzyness, then syncope. She was helped back into bed, upon which she had a 2nd syncopal event. When she aroused, there was no post-ictal period. Patient reports she has been lethargic since her knee arthroplasty in July and never recovered her energy after that. She admits to SOB for "several weeks." Denies cough, sputum, wheezing, hemoptysis, cp, or leg swelling. She denies any prior hx of DVT/PE.   SIGNIFICANT PAST MEDICAL HISTORY   See above   SIGNIFICANT EVENTS:  10/16: presented to ER for 2 syncopal events, found to have submassive PE  STUDIES:   CTA Chest: 1. Positive for acute bilateral PE with CT evidence of right heart strain (RV/LV Ratio = 1.33) consistent with at least submassive (intermediate risk) PE. 2. Interval increase in size of pleural-based hypodense pulmonary mass in the right lower lobe now with central areas of necrosis demonstrated. 3. Additional bilateral pulmonary nodules as above described with equivocal overall change some which appear to have decreased in size and is slightly increased. 4. Redemonstration of mediastinal lymphadenopathy also without significant progression.  CT Head (10/16): Mild involutional changes of the brain with chronic appearing small vessel ischemic disease. No acute intracranial abnormality.  CULTURES:   None ANTIBIOTICS:  None LINES/TUBES:  PIV's Chest Port CONSULTANTS:  [facility] (admitting service) PCCM (consult) SUBJECTIVE:  Lying on ER stretcher in NAD, on RA and is very talkative  CONSTITUTIONAL: BP (!) 149/92   Pulse 92   Temp 97.6 F (36.4 C) (Oral)   Resp 16   Ht 5\' 6"  (1.676 m)   Wt 81.6 kg   SpO2 93%   BMI 29.05 kg/m  No intake/output data recorded.     PHYSICAL EXAM: General: WDWN Elderly female lying on ER stretcher in NAD, Chatty and pleasant Neuro: AAOx3, moving all extremities, PERRL, obeying commands, asking appropriate questions HEENT: OP clear, MM moist  Cardiovascular: RRR no m/r/g Lungs: CTA b/l, no respiratory distress. Speaking in full sentences. Pox 93-95% on RA Abdomen: Soft mildly TTP diffusely, no guarding Musculoskeletal: no LE edema. Legs are grossly equal in size  Skin: no rashes   RESOLVED PROBLEM LIST   ASSESSMENT AND PLAN   83yoF with hx endometrial cancer with peritoneal and pulmonary mets, on chemo, s/p recent knee arthroplasty, now with SOB x several weeks, and 2 syncopal events today at home, found to have a submassive PE  1. Submassive PE - patient is not a good candidate for lytics given her advanced age, limited baseline functional status, and the presence of peritoneal mets which may be higher risk for bleeding following lytics. Since she is hemodynamically stable and on RA, there is not a strong indication for lytics at this time anyway, despite a large clot burden on my review of her CTA Chest - recommend BLE dopplers STAT; maintain bedrest until LE DVT is ruled out - TTE in AM -  agree with Dr Alcario Drought that patient would likely have less chance of recurrent thromboembolic disease if she is discharged on lovenox versus an oral anticoagulant, as was found in studies on patient's who have thromboembolic disease in setting of hypercoagulability from malignancy. However at this time she is opposed to injections in her stomach. Therefore,  will start heparin infusion overnight. Day team and re-address options for long-term anticoagulants with patient.  - patient is stable for admit to SDU by [facility]; PCCM will now sign off. Please re-consult should you need further assistance.   SUMMARY OF TODAY'S PLAN:  See above  Best Practice / Goals of Care / Disposition.   DVT PROPHYLAXIS: heparin infusion SUP: not indicated NUTRITION: heart healthy diet MOBILITY: bedrest  GOALS OF CARE: FULL CODE FAMILY DISCUSSIONS: discussed active problems and plan of care with patient and her husband  DISPOSITION admit to SDU under [facility]  LABS  Glucose Recent Labs  Lab 07/14/18 1629  GLUCAP 115*   BMET Recent Labs  Lab 07/12/18 0927 07/14/18 1655  NA 141 138  K 3.7 4.3  CL 106 102  CO2 25 25  BUN 23 29*  CREATININE 1.19* 1.24*  GLUCOSE 97 119*   Liver Enzymes Recent Labs  Lab 07/12/18 0927 07/14/18 1655  AST 20 32  ALT 8 17  ALKPHOS 58 58  BILITOT 0.3 0.5  ALBUMIN 3.6 3.1*   Electrolytes Recent Labs  Lab 07/12/18 0927 07/14/18 1655  CALCIUM 9.1 8.7*   CBC Recent Labs  Lab 07/12/18 0927 07/14/18 1655  WBC 4.1 5.1  HGB 10.3* 9.5*  HCT 32.3* 31.1*  PLT 252 206   ABG No results for input(s): PHART, PCO2ART, PO2ART in the last 168 hours.  Coag's Recent Labs  Lab 07/14/18 2240  APTT 25  INR 1.06   Sepsis Markers No results for input(s): LATICACIDVEN, PROCALCITON, O2SATVEN in the last 168 hours.  Cardiac Enzymes Recent Labs  Lab 07/14/18 1655 07/14/18 2005  TROPONINI 0.72* 0.78*   PAST MEDICAL HISTORY :   She  has a past medical history of Aortic atherosclerosis ([place]), Arthritis, Arthritis of knee, left, Borderline osteopenia (DEXA 2006 and 2012), Cancer ([place]), Cataract, Cholesterol serum elevated, Colon polyps, DDD (degenerative disc disease), lumbar, DDD (degenerative disc disease), lumbar, Diverticulitis of sigmoid colon, Facet arthropathy, lumbar, FHx: colon cancer, GERD (gastroesophageal reflux  disease), History of hiatal hernia, History of radiation therapy (08/06/16 - 09/03/16), Hypothyroidism, Kidney lesion, native, right, Left foot drop, Leukopenia, Neuropathy, Obesity, Pelvic abscess in female (01/2017), Spondylisthesis, and Umbilical hernia.  PAST SURGICAL HISTORY:  She  has a past surgical history that includes Cholecystectomy; Tubal ligation; Colonoscopy (2010, 04/2013); Cataract extraction, bilateral; Pterygium excision; Robotic assisted total hysterectomy with bilateral salpingo oophorectomy (N/A, 05/06/2016); Sentinel node biopsy (N/A, 05/06/2016); ir generic historical (08/11/2016); ir generic historical (08/11/2016); IR Radiologist Eval & Mgmt (02/25/2017); IR Radiologist Eval & Mgmt (03/11/2017); IR REMOVAL TUN ACCESS W/ PORT W/O FL MOD SED (03/23/2018); Total knee arthroplasty (Left, 04/12/2018); and IR IMAGING GUIDED PORT INSERTION (06/25/2018).  Allergies  Allergen Reactions  . Levofloxacin Other (See Comments)    Unknown  . Promethazine Hcl Other (See Comments)    fainting  . Sulfa Antibiotics Swelling  . Sulfa Drugs Cross Reactors Swelling  . Sulfur Swelling    Current Facility-Administered Medications on File Prior to Encounter  Medication  . influenza  inactive virus vaccine (FLUZONE/FLUARIX) injection 0.5 mL   Current Outpatient Medications on File Prior to Encounter  Medication Sig  . dexamethasone (DECADRON)  4 MG tablet Take 1 tablet (4 mg total) by mouth daily. (Patient taking differently: Take 4 mg by mouth daily. Pt takes each morning)  . dronabinol (MARINOL) 2.5 MG capsule Take 1 capsule (2.5 mg total) by mouth 2 (two) times daily before lunch and supper.  . DULoxetine (CYMBALTA) 30 MG capsule Take 30 mg by mouth at bedtime.  [name] levothyroxine (SYNTHROID, LEVOTHROID) 88 MCG tablet TAKE ONE TABLET BY MOUTH ONCE DAILY BEFORE  BREAKFAST.  [name] lidocaine-prilocaine (EMLA) cream Apply 1 application topically as needed.  . Melatonin 5 MG CAPS Take 5 mg by mouth at bedtime.    . metoCLOPramide (REGLAN) 10 MG tablet Take 1 tablet (10 mg total) by mouth 3 (three) times daily before meals.  . ondansetron (ZOFRAN) 8 MG tablet Take 1 tablet (8 mg total) by mouth every 8 (eight) hours as needed for nausea.  Vladimir Faster Glycol-Propyl Glycol (SYSTANE ULTRA) 0.4-0.3 % SOLN Place 1 drop into both eyes daily as needed (for dry eyes).  . polyethylene glycol (MIRALAX / GLYCOLAX) packet Take 17 g by mouth daily.   [name] triamcinolone (NASACORT) 55 MCG/ACT AERO nasal inhaler Place 1 spray into the nose 2 (two) times daily.  . vitamin B-12 (CYANOCOBALAMIN) 1000 MCG tablet Take 1,000 mcg by mouth daily.  [name] HYDROmorphone (DILAUDID) 2 MG tablet Take 1 tablet (2 mg total) by mouth every 4 (four) hours as needed for severe pain.   FAMILY HISTORY:   Her family history includes Breast cancer in her maternal aunt; Cancer in her maternal aunt and maternal grandmother; Cancer (age of onset: 25) in her mother; Heart disease in her mother; Macular degeneration in her maternal grandmother; Mental illness in her maternal uncle. There is no history of Diabetes.  SOCIAL HISTORY:  She  reports that she quit smoking about 37 years ago. Her smoking use included cigarettes. She smoked 1.00 pack per day. She has never used smokeless tobacco. She reports that she drinks alcohol. She reports that she does not use drugs.  REVIEW OF SYSTEMS:    Review of Systems  Constitutional: Positive for malaise/fatigue. Negative for chills and fever.  HENT: Negative.   Eyes: Negative.   Respiratory: Positive for shortness of breath and wheezing. Negative for cough, hemoptysis and sputum production.   Cardiovascular: Negative.  Negative for chest pain and leg swelling.  Gastrointestinal: Positive for abdominal pain and diarrhea. Negative for blood in stool, constipation, melena, nausea and vomiting.  Genitourinary: Negative.   Musculoskeletal: Negative.   Skin: Negative.   Neurological: Positive for dizziness, focal  weakness and loss of consciousness. Negative for tingling, tremors, sensory change, speech change, weakness and headaches.  Endo/Heme/Allergies: Negative.   Psychiatric/Behavioral: Negative.     60 minutes critical care time  Vernie Murders, MD Pulmonary & Critical Care Medicine Pager: [phone]

## 2018-07-14 NOTE — Telephone Encounter (Addendum)
 "  [place] husband, Jeneen Rinks [phone]) calling to let Dr. Homero Fellers know [place] fainted twice today going to the bathroom.  Placing a Bed Side Commode by the bed now.  Was feeling fine first two days after treatment but went down hill today.  Two soft, watery diarrhea stools today.    Uses Gwendolyn Lima to prevent constipation.  Tries drinking water but does not drink 64 oz daily.        Woke up feeling dizzy, out of bed to go to bathroom, walked twenty feet, fainted.    Out a minute to a minute and a half.  Sweating, totally white color, no trouble breathing, eyes closed, not talking.  Came to, alert, recognized me, talking pretty clear but does not remember fainting.      Took Decadron with Reglan at 7:30 am without food as instructed to take Reglan 30 minutes before meal but not able to eat today.    Dry heaves, no vomiting.  Zofran makes her sleepy so she took half Zofran at 10:00 am, the other half at 12:30 pm.    I don't think I can get her there to be seen and she doesn't think she can either.  Unable to find B/P cuff to check because we just moved. " Joaquim Lai on call periodically between episodes of audible dry heaves. Advised need to go to ED.  Rise slowly, get bearings before walking.    Verbal order received and read back from Braden for Chelsa Stout Basford's spouse to call 911 for ER evaluation.  Jeneen Rinks notified of provider orders.  Reports "third BM not as liquid, did not vomit, not heaving, trying to rest in bed".   Answered questions what to tell EMS.  ER provider will consult on-call provider, Dr. Julien Nordmann if further information is needed.  Present chemotherapy alert card to ER staff.  No further needs or questions at this time.

## 2018-07-14 NOTE — ED Notes (Signed)
Pt has been informed that urine collection is needed  

## 2018-07-14 NOTE — ED Notes (Signed)
Date and time results received: 07/14/18 1801 (use smartphrase ".now" to insert current time)  Test: Troponin Critical Value: 0.72  Name of Provider Notified: Long  Orders Received? Or Actions Taken?:See order details

## 2018-07-15 ENCOUNTER — Ambulatory Visit: Payer: Self-pay | Admitting: Family Medicine

## 2018-07-15 ENCOUNTER — Inpatient Hospital Stay (HOSPITAL_COMMUNITY): Payer: Medicare Other

## 2018-07-15 ENCOUNTER — Encounter: Payer: Medicare Other | Admitting: Family Medicine

## 2018-07-15 DIAGNOSIS — I2699 Other pulmonary embolism without acute cor pulmonale: Secondary | ICD-10-CM

## 2018-07-15 DIAGNOSIS — N1330 Unspecified hydronephrosis: Secondary | ICD-10-CM

## 2018-07-15 DIAGNOSIS — G893 Neoplasm related pain (acute) (chronic): Secondary | ICD-10-CM

## 2018-07-15 DIAGNOSIS — C787 Secondary malignant neoplasm of liver and intrahepatic bile duct: Secondary | ICD-10-CM

## 2018-07-15 DIAGNOSIS — I5030 Unspecified diastolic (congestive) heart failure: Secondary | ICD-10-CM

## 2018-07-15 DIAGNOSIS — E538 Deficiency of other specified B group vitamins: Secondary | ICD-10-CM

## 2018-07-15 DIAGNOSIS — C541 Malignant neoplasm of endometrium: Secondary | ICD-10-CM

## 2018-07-15 DIAGNOSIS — D630 Anemia in neoplastic disease: Secondary | ICD-10-CM

## 2018-07-15 LAB — CBC
HCT: 29.4 % — ABNORMAL LOW (ref 36.0–46.0)
Hemoglobin: 9.2 g/dL — ABNORMAL LOW (ref 12.0–15.0)
MCH: 26.2 pg (ref 26.0–34.0)
MCHC: 31.3 g/dL (ref 30.0–36.0)
MCV: 83.8 fL (ref 80.0–100.0)
Platelets: 197 10*3/uL (ref 150–400)
RBC: 3.51 MIL/uL — ABNORMAL LOW (ref 3.87–5.11)
RDW: 16.3 % — ABNORMAL HIGH (ref 11.5–15.5)
WBC: 4.7 10*3/uL (ref 4.0–10.5)
nRBC: 0 % (ref 0.0–0.2)

## 2018-07-15 LAB — BASIC METABOLIC PANEL
Anion gap: 9 (ref 5–15)
BUN: 24 mg/dL — ABNORMAL HIGH (ref 8–23)
CO2: 25 mmol/L (ref 22–32)
Calcium: 7.9 mg/dL — ABNORMAL LOW (ref 8.9–10.3)
Chloride: 102 mmol/L (ref 98–111)
Creatinine, Ser: 1.03 mg/dL — ABNORMAL HIGH (ref 0.44–1.00)
GFR calc Af Amer: 57 mL/min — ABNORMAL LOW (ref >60)
GFR calc non Af Amer: 50 mL/min — ABNORMAL LOW (ref >60)
Glucose, Bld: 88 mg/dL (ref 70–99)
Potassium: 3.7 mmol/L (ref 3.5–5.1)
Sodium: 136 mmol/L (ref 135–145)

## 2018-07-15 LAB — HEPARIN LEVEL (UNFRACTIONATED)
HEPARIN UNFRACTIONATED: 0.72 [IU]/mL — AB (ref 0.30–0.70)
Heparin Unfractionated: 0.39 IU/mL (ref 0.30–0.70)

## 2018-07-15 LAB — TROPONIN I
Troponin I: 0.77 ng/mL (ref <0.03)
Troponin I: 0.58 ng/mL (ref <0.03)
Troponin I: 0.39 ng/mL (ref <0.03)

## 2018-07-15 LAB — ECHOCARDIOGRAM COMPLETE
HEIGHTINCHES: 66 in
WEIGHTICAEL: 2880 [oz_av]

## 2018-07-15 LAB — MRSA PCR SCREENING: MRSA by PCR: NEGATIVE

## 2018-07-15 MED ORDER — HEPARIN (PORCINE) IN NACL 100-0.45 UNIT/ML-% IJ SOLN
1100.0000 [IU]/h | INTRAMUSCULAR | Status: DC
  Administered 2018-07-15: 1100 [IU]/h via INTRAVENOUS
  Filled 2018-07-15: qty 250

## 2018-07-15 MED ORDER — ORAL CARE MOUTH RINSE
15.0000 mL | Freq: Two times a day (BID) | OROMUCOSAL | Status: DC
  Administered 2018-07-15 – 2018-07-17 (×4): 15 mL via OROMUCOSAL

## 2018-07-15 MED ORDER — HEPARIN (PORCINE) IN NACL 100-0.45 UNIT/ML-% IJ SOLN
1000.0000 [IU]/h | INTRAMUSCULAR | Status: AC
  Administered 2018-07-15 (×2): 1000 [IU]/h via INTRAVENOUS
  Filled 2018-07-15: qty 250

## 2018-07-15 MED ORDER — HYDRALAZINE HCL 20 MG/ML IJ SOLN: 5.0000 mg | Freq: Four times a day (QID) | INTRAMUSCULAR | Status: DC | PRN

## 2018-07-15 MED ORDER — HEPARIN BOLUS VIA INFUSION
3000.0000 [IU] | Freq: Once | INTRAVENOUS | Status: AC
  Administered 2018-07-15: 3000 [IU] via INTRAVENOUS
  Filled 2018-07-15: qty 3000

## 2018-07-15 MED ORDER — PERFLUTREN LIPID MICROSPHERE
1.0000 mL | INTRAVENOUS | Status: AC | PRN
  Administered 2018-07-15: 2 mL via INTRAVENOUS
  Filled 2018-07-15: qty 10

## 2018-07-15 MED ORDER — HYDRALAZINE HCL 20 MG/ML IJ SOLN
5.0000 mg | Freq: Once | INTRAMUSCULAR | Status: AC
  Administered 2018-07-15: 5 mg via INTRAVENOUS
  Filled 2018-07-15: qty 1

## 2018-07-15 NOTE — Progress Notes (Addendum)
PROGRESS NOTE  Kayla Guerrero TOI:[id number] DOB: 1935/08/28 DOA: 07/14/2018 PCP: Rita Ohara, MD  HPI/Brief Narrative  Kayla Guerrero is a 83 y.o. year old female with medical history significant for endometrial cancer on chemo, hypothyroidism who presented on 07/14/2018 with 2 episodes of sudden syncope after several weeks of progressive dyspnea with exertion and rest and was found to have acute bilateral PE.  Subjective No chest pain, no shortness of breath, no cough.    Assessment/Plan:  #Acute bilateral submassive PE, stable.  On oxygen currently but seems more likely related to comfort patient has normal oxygen saturation, and hemodynamically stable.  Still needs inpatient level of care to continue monitoring in stepdown unit on IV heparin as echo does show signs of RV pressure/volume overload with reduced ventricular systolic function consistent with large PE and hemodynamic effect, She is  not a lytic candidate as she is hemodynamically stable, additionally her poor functional status ( endometrial cancer with peritoneal mets). Very likely provoked in this patient found to have bilateral DVTs lower extremities in setting of known malignancy in addition to recent surgery. Currently on Iv heparin, she is amenable to Lovenox (given malignancy this would be a better anticoagulation choice) which we will transition to in next 24 hours.   Appreciate critical care consultation (signed off).  #Endometrial cancer.  Followed by Dr. Leslee Home on admission), undergoing current chemotherapy, continue Decadron   #Cancer associated pain.  Stable.  Continue oral Dilaudid  #Chronic nausea.  No emesis.  Continue low-dose dexamethasone (started by oncologist), PRN antiemetics.  Not having much abdominal pain today we will continue to monitor.  #Chemotherapy-induced neuropathy, stable.  Continue home Cymbalta.  #B12 deficiency, chronic.  Continue oral vitamin B12 supplementation   Code  Status: Full code,  Family Communication: No family at bedside  Disposition Plan: Need to transition from IV heparin to Lovenox in 24 hours, wean oxygen  if continues remain stable will transition out of stepdown unit in 24 hours and ambulatory oxygen test.   Consultants:  Oncology, critical care  Procedures:  DVT ultrasound  TTE  Antimicrobials: Anti-infectives (From admission, onward)   None         Cultures:  none  Telemetry:none  DVT prophylaxis:  IV heparin   Objective: Vitals:   07/15/18 0509 07/15/18 0600 07/15/18 0700 07/15/18 0800  BP: (!) 170/83 (!) 145/79 (!) 145/87   Pulse: 90 81 79   Resp: 16 10 12    Temp: 98.1 F (36.7 C)   97.7 F (36.5 C)  TempSrc:    Oral  SpO2: 94% 94% 95%   Weight:      Height:        Intake/Output Summary (Last 24 hours) at 07/15/2018 0906 Last data filed at 07/15/2018 0503 Gross per 24 hour  Intake 220 ml  Output -  Net 220 ml   Filed Weights   07/14/18 1606  Weight: 81.6 kg    Exam:  Gen- elderly female, lying comfortably in bed, no distress Eyes-anicteric sclera, EOMI ENMT- moist oral mucosa CV-RRR, no appreciable murmurs, rubs, or gallops, no peripheral edema Abd- soft, non-distended, non-tender, normal bowel sounds Resp- 2L [place] O2, clear breath sounds, normal respiratory effort Skin- no wounds, intact, good turgor  Data Reviewed: CBC: Recent Labs  Lab 07/12/18 0927 07/14/18 1655 07/15/18 0452  WBC 4.1 5.1 4.7  NEUTROABS 3.1  --   --   HGB 10.3* 9.5* 9.2*  HCT 32.3* 31.1* 29.4*  MCV 83.7 85.9 83.8  PLT 252 206 093   Basic Metabolic Panel: Recent Labs  Lab 07/12/18 0927 07/14/18 1655 07/15/18 0452  NA 141 138 136  K 3.7 4.3 3.7  CL 106 102 102  CO2 25 25 25   GLUCOSE 97 119* 88  BUN 23 29* 24*  CREATININE 1.19* 1.24* 1.03*  CALCIUM 9.1 8.7* 7.9*   GFR: Estimated Creatinine Clearance: 46.1 mL/min (A) (by C-G formula based on SCr of 1.03 mg/dL (H)). Liver Function Tests: Recent  Labs  Lab 07/12/18 0927 07/14/18 1655  AST 20 32  ALT 8 17  ALKPHOS 58 58  BILITOT 0.3 0.5  PROT 6.7 5.9*  ALBUMIN 3.6 3.1*   Recent Labs  Lab 07/14/18 1655  LIPASE 35   No results for input(s): AMMONIA in the last 168 hours. Coagulation Profile: Recent Labs  Lab 07/14/18 2240  INR 1.06   Cardiac Enzymes: Recent Labs  Lab 07/14/18 1655 07/14/18 2005 07/14/18 2323 07/15/18 0452  TROPONINI 0.72* 0.78* 0.77* 0.58*   BNP (last 3 results) No results for input(s): PROBNP in the last 8760 hours. HbA1C: No results for input(s): HGBA1C in the last 72 hours. CBG: Recent Labs  Lab 07/14/18 1629  GLUCAP 115*   Lipid Profile: No results for input(s): CHOL, HDL, LDLCALC, TRIG, CHOLHDL, LDLDIRECT in the last 72 hours. Thyroid Function Tests: No results for input(s): TSH, T4TOTAL, FREET4, T3FREE, THYROIDAB in the last 72 hours. Anemia Panel: Recent Labs    07/12/18 1155  VITAMINB12 253   Urine analysis:    Component Value Date/Time   COLORURINE YELLOW 07/14/2018 2004   APPEARANCEUR CLEAR 07/14/2018 2004   LABSPEC 1.014 07/14/2018 2004   LABSPEC 1.030 06/10/2018 1157   PHURINE 6.0 07/14/2018 2004   GLUCOSEU NEGATIVE 07/14/2018 2004   HGBUR NEGATIVE 07/14/2018 2004   BILIRUBINUR NEGATIVE 07/14/2018 2004   BILIRUBINUR negative 06/10/2018 1157   BILIRUBINUR neg 02/16/2017 [place] 07/14/2018 2004   PROTEINUR NEGATIVE 07/14/2018 2004   UROBILINOGEN 0.2 02/16/2017 1259   NITRITE NEGATIVE 07/14/2018 2004   LEUKOCYTESUR NEGATIVE 07/14/2018 2004   Sepsis Labs: @LABRCNTIP (procalcitonin:4,lacticidven:4)  ) Recent Results (from the past 240 hour(s))  MRSA PCR Screening     Status: None   Collection Time: 07/15/18  3:00 AM  Result Value Ref Range Status   MRSA by PCR NEGATIVE NEGATIVE Final    Comment:        The GeneXpert MRSA Assay (FDA approved for NASAL specimens only), is one component of a comprehensive MRSA colonization surveillance  program. It is not intended to diagnose MRSA infection nor to guide or monitor treatment for MRSA infections. Performed at [facility], [place] [address] [place], [place] [address]       Studies: Dg Chest 2 View  Result Date: 07/14/2018 CLINICAL DATA:  Dizziness after chemotherapy. EXAM: CHEST - 2 VIEW COMPARISON:  Chest CT 06/11/2018 FINDINGS: Heart size is borderline enlarged. There is aortic atherosclerosis. Port catheter tip terminates in the distal SVC. Rounded posterior pleural based mass in the right lower lobe measuring 7.8 cm in diameter, previously noted on prior CT is without significant change allowing for differences in technique. The smaller pulmonary nodules better visualized on CT are not well visualized radiographically. No aggressive osseous lesions. No pulmonary consolidation, effusion or edema. IMPRESSION: 1. Pleural-based right lower lobe mass measuring 7.8 cm in diameter is redemonstrated. The small pulmonary masses and nodules are not well visualized radiographically nor are the mildly enlarged mediastinal lymph nodes described on CT.  2. No acute pulmonary consolidation, effusion or pneumothorax. 3. Borderline cardiomegaly with aortic atherosclerosis. Electronically Signed   By: Ashley Royalty M.D.   On: 07/14/2018 18:55   Ct Head Wo Contrast  Result Date: 07/14/2018 CLINICAL DATA:  Dizziness after chemotherapy. EXAM: CT HEAD WITHOUT CONTRAST TECHNIQUE: Contiguous axial images were obtained from the base of the skull through the vertex without intravenous contrast. COMPARISON:  None. FINDINGS: Brain: Chronic age related involutional changes of the brain. Likely chronic minimal small vessel ischemic disease of periventricular white matter. No definite intra-axial mass given limitations of a noncontrast study. No evidence of vasogenic edema or midline shift. No extra-axial fluid collections. No hydrocephalus. Midline fourth ventricle basal cisterns. Brainstem  and cerebellum are nonacute in appearance. Vascular: No hyperdense vessel sign. Atherosclerosis of the carotid siphons bilaterally. Skull: Negative for fracture or focal lesions. Sinuses/Orbits: No acute finding. Other: None. IMPRESSION: Mild involutional changes of the brain with chronic appearing small vessel ischemic disease. No acute intracranial abnormality. Electronically Signed   By: Ashley Royalty M.D.   On: 07/14/2018 18:58   Ct Angio Chest Pe W And/or Wo Contrast  Result Date: 07/14/2018 CLINICAL DATA:  Syncope this morning with dyspnea for several weeks. EXAM: CT ANGIOGRAPHY CHEST WITH CONTRAST TECHNIQUE: Multidetector CT imaging of the chest was performed using the standard protocol during bolus administration of intravenous contrast. Multiplanar CT image reconstructions and MIPs were obtained to evaluate the vascular anatomy. CONTRAST:  1mL ISOVUE-370 IOPAMIDOL (ISOVUE-370) INJECTION 76% COMPARISON:  CXR 07/14/2018, chest CT 06/11/2018 FINDINGS: Cardiovascular: Conventional branch pattern of the great vessels with atherosclerotic origins. Nonaneurysmal thoracic aorta to 3.5 cm. Satisfactory opacification of the pulmonary arteries with bilateral pulmonary artery starting from the distal most aspect of the main right and left pulmonary arteries extending into the lobar and segmental branches bilaterally. RV/LV ratio approximately 1.33 consistent with right heart failure. Heart size is enlarged. There left main coronary arteriosclerosis. Heart size is normal without pericardial effusion or thickening. Mediastinum/Nodes: Mediastinal lymphadenopathy is redemonstrated with a 1.4 cm short axis lymph node in the right upper paratracheal portion versus 1.5 cm previously. Additional right lower paratracheal/precarinal lymph node measuring 1.7 cm short axis unchanged in appearance, series 4/39 versus series 2/28. No hilar lymphadenopathy. Moderate hiatal hernia. No thyromegaly or mass. The esophagus is  unremarkable. Patent midline trachea and mainstem bronchi. Lungs/Pleura: Interval increase in size of dominant mass, pleural based in the posterior right lower lobe measuring 7.8 x 5.8 cm presently versus to 7.3 cm previously. New areas of necrosis are identified centrally with mottled foci of gas seen within. Trace pleural effusions are present. Additional pulmonary nodules are identified as follows: 1. Left upper lobe pleural-based 11 mm nodule versus 10 mm previously, series 10/31 versus series 6/38. 2. Left upper lobe 4 mm centrally located pulmonary nodule, series 10/40 versus 6 mm previously series 6/48. 3. Left upper lobe cavitary nodule measuring 1.4 cm, series 10/52 versus 1.5 cm previously on series 6/63. 4. Left upper lobe 10 mm nodule abutting the major fissure, series 10/58 unchanged. 5. Right upper lobe posterior 18 mm nodule versus 21 mm previously, series 10/41 versus series 6/46. 6. Right upper lobe subpleural lateral nodule measuring 4 mm unchanged in appearance, series 10/61. 7. Right upper lobe posterior 6 mm cavitary nodule unchanged in appearance, series 10/64. 8. Right lower lobe partially cavitary 4 mm nodule adjacent to the major fissure, unchanged, series 10/93. Upper Abdomen: Partially included markedly dilated appearing right renal collecting system with cortical thinning and  mild-to-moderate dilatation of the left renal collecting system. Subtle hypodensity in the right hepatic lobe measuring 12 mm in diameter, series 4/75, similar in appearance bed nonspecific. Metastatic focus is not excluded. Cholecystectomy is seen. Musculoskeletal: No aggressive osseous lesions. Review of the MIP images confirms the above findings. IMPRESSION: 1. Positive for acute bilateral PE with CT evidence of right heart strain (RV/LV Ratio = 1.33) consistent with at least submassive (intermediate risk) PE. The presence of right heart strain has been associated with an increased risk of morbidity and mortality.  Please activate Code PE by paging [phone]. 2. Interval increase in size of pleural-based hypodense pulmonary mass in the right lower lobe now with central areas of necrosis demonstrated. 3. Additional bilateral pulmonary nodules as above described with equivocal overall change some which appear to have decreased in size and is slightly increased. 4. Redemonstration of mediastinal lymphadenopathy also without significant progression. These results were called by telephone at the time of interpretation on 07/14/2018 at 10:28 pm to Dr. Nanda Quinton , who verbally acknowledged these results. Aortic Atherosclerosis (ICD10-I70.0). Electronically Signed   By: Ashley Royalty M.D.   On: 07/14/2018 22:28    Scheduled Meds: . dexamethasone  4 mg Oral Q breakfast  . dronabinol  2.5 mg Oral BID AC  . DULoxetine  30 mg Oral QHS  . fluticasone  1 spray Each Nare BID  . iopamidol      . levothyroxine  88 mcg Oral QAC breakfast  . metoCLOPramide  10 mg Oral TID AC  . polyethylene glycol  17 g Oral Daily  . sodium chloride      . vitamin B-12  1,000 mcg Oral Daily    Continuous Infusions: . heparin 1,100 Units/hr (07/15/18 0116)     LOS: 1 day     Desiree Hane, MD [facility] Pager [phone]  If 7PM-7AM, please contact night-coverage [email or web address] Password [email or web address] 07/15/2018, 9:06 AM

## 2018-07-15 NOTE — Progress Notes (Signed)
Kayla Guerrero   DOB:Jun 13, 1935   FV#:[id number]    Assessment & Plan:   Malignant neoplasm of endometrium [facility]) She has received 2 cycles of chemotherapy.  She has appointment to see me next week for further supportive care  Cancer associated pain She has persistent abdominal pain secondary to her abdominal disease She will continue prescription pain medicine as needed  Significant PE She is currently anticoagulated If she has no bleeding complications, she can be transitioned to oral anticoagulation therapy or Lovenox upon discharge  Hydronephrosis of right kidney Renal function is stable/improved I recommend aggressive hydration therapy  B12 deficiency She has borderline vitamin B12 deficiency I recommend oral vitamin B12 supplement Hopefully, with aggressive replacement therapy, it would also improve neuropathy  Nausea without vomiting This is stable.  Continue antiemetics as needed.  Anemia chronic disease Likely due to recent chemo.  Observe only  Discharge planning Will defer to primary service. She has appointment to see me next Tuesday.  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 Microscopic Comment The overall appearance favors serous carcinoma.     04/23/2016 Initial Diagnosis    Patient presented to PCP with new vaginal discharge and some vaginal spotting, referred to Dr Dory Horn, whom she had known previously. D&C on 04-09-16 had high grade carcinoma favoring serous histology [phone]). She was referred to gyn oncology, saw Dr Denman George on 04-23-16.     04/30/2016 Imaging    Markedly thickened/widened endometrium consistent with known endometrial  cancer. No evidence of serosal or extra uterine extension. 2. No findings to suggest metastatic disease involving the chest, abdomen or pelvis. 3. Indeterminant 12.5 mm right renal lesion, small enhancing mass versus hemorrhagic cyst. Attention on future scans is suggested. 4. Atherosclerotic calcifications involving the thoracic and abdominal aorta and branch vessels but no focal aneurysm. 5. Moderate stool throughout the colon and down into the rectum may suggest constipation.     05/06/2016 Surgery    Robotic hysterectomy and staging. IB USC, 0/11 nodes. Dispositioned to chemotherapy with paclitaxel and carboplatin x 6 with vaginal brachytherapy.    05/06/2016 Pathology Results    1. Lymph node, biopsy, right peri-aortic - ONE OF ONE LYMPH NODES NEGATIVE FOR MALIGNANCY (0/1). 2. Lymph node, biopsy, left peri-aortic - ONE OF ONE LYMPH NODES NEGATIVE FOR MALIGNANCY (0/1). 3. Lymph node, biopsy, right pelvic - FOUR OF FOUR LYMPH NODES NEGATIVE FOR MALIGNANCY (0/4). 4. Lymph node, biopsy, left pelvic - FIVE OF FIVE LYMPH NODES NEGATIVE FOR MALIGNANCY (0/5). 5. Uterus +/- tubes/ovaries, neoplastic - UTERUS: -ENDO/MYOMETRIUM: INVASIVE MIXED ENDOMETRIOID AND SEROUS CARCINOMA, SPANNING 4 CM. TUMOR INVADES OUTER HALF OF MYOMETRIUM. LYMPHOVASCULAR INVASION PRESENT. SEE ONCOLOGY TABLE. LEIOMYOMA. -SEROSA: UNINVOLVED. NO MALIGNANCY. - CERVIX: BENIGN SQUAMOUS AND ENDOCERVICAL MUCOSA. NO DYSPLASIA OR MALIGNANCY. - BILATERAL OVARIES: INCLUSION CYSTS. NO MALIGNANCY. - BILATERAL FALLOPIAN TUBES: UNREMARKABLE. NO MALIGNANCY.  Specimen: Uterus, cervix, bilateral ovaries and fallopian tubes, bilateral pelvic and para-aortic lymph nodes. Procedure: Hysterectomy with bilateral salpingo-oophorectomy. Lymph node sampling performed: Bilateral pelvic and para-aortic lymph node biopsies Specimen integrity: Intact. 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     05/16/2016 Imaging  Ct abdomen: Extensive subcutaneous emphysema about the abdomen and pelvis, likely postoperative. Probable abdominal pelvic wall small volume hematomas, as above. 2. Right pelvic sidewall fluid collection is likely a seroma or lymphangioma. No explanation for left lower extremity pain. 3. Minimal ill-defined fluid in the presacral space and left adnexa. 4. Small hiatal hernia. 5. An incidental finding of potential clinical significance has been found. Indeterminate right renal lesion is similar to on the recent exam. Consider further evaluation with dedicated outpatient pre and post contrast abdominal MRI    06/11/2016 - 08/22/2016 Chemotherapy    The patient completed only three cycles due to toxicity from paclitaxel and carboplatin.     07/29/2016 Imaging    CT angiogram chest: No demonstrable pulmonary embolus. Multiple foci of atherosclerotic calcification in the aorta as well as foci of coronary artery calcification.  No edema or consolidation. No lung mass or nodule lesion. No adenopathy. Gallbladder absent.  Stable mild biliary duct prominence. Stable nodular opacity right lobe of thyroid which does not meet consensus guidelines criteria for further assessment    08/06/2016 - 09/03/2016 Radiation Therapy    HDR vaginal cuff brachytherapy x 5 fractions.    08/11/2016 Procedure    Ultrasound and fluoroscopically guided right internal jugular single lumen power port catheter insertion. Tip in the SVC/RA junction. Catheter ready for use.    09/23/2016 Imaging    Ct abdomen: No findings suspicious for metastatic disease in the abdomen or pelvis. 2. Thin-walled lobulated 6.9 x 5.8 x 5.8 cm right pelvic sidewall fluid collection, increased in size since 05/16/2016 CT study, favor a postoperative lymphocele, which demonstrates extrinsic mass-effect on the right bladder wall. 3. Indeterminate 1.2 cm posterior interpolar right renal cortical lesion, for which 4 month stability has  been demonstrated, renal neoplasm not excluded. Recommend either dedicated renal protocol MRI or CT abdomen without and with IV contrast or continued attention on follow-up surveillance CT studies, as clinically warranted. 4. Additional findings include aortic atherosclerosis, moderate hiatal hernia, moderate sigmoid diverticulosis, tiny fat containing umbilical hernia, and degenerative disc disease, facet arthropathy and spondylolisthesis in the lower lumbar spine    09/30/2016 Procedure    She had successful CT-guided aspiration of right pelvic fluid collection. Approximately 90 mL yellow serous fluid was aspirated. Samples were sent for culture and cytology.    11/19/2016 Imaging    CT abdomen: No CT findings to suggest recurrent tumor, lymphadenopathy or metastatic disease. 2. Stable right-sided pelvic sidewall cyst with mass effect on the bladder. 3. Stable advanced atherosclerotic calcifications involving the aorta and iliac arteries. 4. Moderate size hiatal hernia. 5. Status post cholecystectomy with stable intra and extrahepatic biliary dilatation    12/29/2016 Pathology Results    SOFT TISSUE, FINE NEEDLE ASPIRATION, RIGHT ADNEXAL CYST (SPECIMEN 1 OF 1 COLLECTED 09-30-2016) NO MALIGNANT CELLS IDENTIFIED.    02/17/2017 Imaging    CT abdomen 1. The previously noted thin walled cystic lesion in the right pelvis has undergone change in appearance ; it is now thick-walled and rim enhancing. There is associated inflammation and edema surrounding the pelvic sidewall lesion which has also increased in size. This suggests interim inflammation/infection of the cystic collection. 2. Urinary bladder displaced to the left by the thick-walled right pelvic sidewall cystic lesion. Wall thickening of the bladder is likely reactive but could also be due to a cystitis. Suggest correlation with urinalysis. 3. Stable intra and extrahepatic biliary dilatation post cholecystectomy 4. Stable 13 mm intermediate density  right renal lesion 5. Diffuse diverticular  disease of the colon without acute inflammation    02/17/2017 - 02/20/2017 Hospital Admission    She presented to the emergency department due to lower abdominal pain, diarrhea for 4 days associated with weakness, hypersomnolence following an episode of a gastroenteritis about a week ago after returning from the beach. She was told to decrease her Cymbalta to decrease somnolence. CT Abd/Pelvis was done which revealed that the previously seen thinwall cystic lesions in the right pelvis is now take wall and ring-enhancing with associated inflammation and edema surrounding the pelvic sidewall lesions which also has increased in size. This suggests inter-inflammation/infection of the cystic collection. The urinary bladder is displaced to the left by these lesions. General Surgery, IR, and GYN were consulted.She underwent IR Drain Right Pelvic Drain placement on 5/23 and is improving. Drain Fluid Cx grew out some Streptococcus Viridans and Cytology is negative. Her symptoms resolved with antibiotics     02/18/2017 Procedure    Successful CT guided placement of a 10 French all purpose drain catheter into the residual/recurrent right pelvic sidewall fluid collection with aspiration of 50 mL of purulent fluid. Samples were sent to the laboratory for both culture and cytologic analysis.    02/25/2017 Imaging    Significant improvement in the bilobed right pelvic sidewall abscess following percutaneous drain. Stable drain catheter position. Small residual abscess is noted, measurements as above. No new abscess. Otherwise stable CT of the abdomen pelvis with contrast.    03/11/2017 Imaging    Ct abdomen: Resolution of right pelvic infected fluid collection after percutaneous drainage. No further fluid is seen around the percutaneous drain and acute inflammatory changes also have nearly resolved    12/03/2017 Imaging    No abnormality identified within the pelvis.     03/23/2018 Procedure    Technically successful tunneled Port catheter removal.    06/11/2018 Imaging    There are several findings most likely related to metastatic malignancy described below:  Multiple bilateral pulmonary nodules and masses, some of which are cavitary.  Abnormal mediastinal adenopathy.  Multiple liver lesions.  Irregular soft tissue mass in the right side of the pelvis resulting in right ureteral obstruction.  Central mesentery mass in the abdomen extending to the ascending and transverse colon causing wall thickening.  2.4 cm lobulated mass in the distal sigmoid colon    06/14/2018 Tumor Marker    Patient's tumor was tested for the following markers: CA-125 Results of the tumor marker test revealed 96.6     Genetic Testing    Patient has genetic testing done for MSI on Surgical Patholgy from 05/06/2016. Results revealed patient has the following: MSI: stable.    06/21/2018 -  Chemotherapy    The patient had carboplatin    07/12/2018 Tumor Marker    Patient's tumor was tested for the following markers: CA-125 Results of the tumor marker test revealed 95.2      Subjective:  Patient well-known to me.  I was notified of her admission.  She had syncopal episode at home.  CT imaging studies showed significant PE.  She is currently anticoagulated with IV heparin.  Echocardiogram is pending  Objective:  Vitals:   07/15/18 1200 07/15/18 1300  BP: (!) 153/75 (!) 159/76  Pulse: 89 89  Resp: 18 12  Temp:    SpO2: 92% 96%     Intake/Output Summary (Last 24 hours) at 07/15/2018 1450 Last data filed at 07/15/2018 0503 Gross per 24 hour  Intake 220 ml  Output -  Net  220 ml    GENERAL:alert, no distress and comfortable. Noted skin bruising SKIN: skin color, texture, turgor are normal, no rashes or significant lesions EYES: normal, Conjunctiva are pink and non-injected, sclera clear OROPHARYNX:no exudate, no erythema and lips, buccal mucosa, and tongue  normal  NECK: supple, thyroid normal size, non-tender, without nodularity LYMPH:  no palpable lymphadenopathy in the cervical, axillary or inguinal LUNGS: clear to auscultation and percussion with normal breathing effort HEART: regular rate & rhythm and no murmurs and no lower extremity edema ABDOMEN:abdomen soft, non-tender and normal bowel sounds Musculoskeletal:no cyanosis of digits and no clubbing  NEURO: alert & oriented x 3 with fluent speech, no focal motor/sensory deficits   Labs:  Lab Results  Component Value Date   WBC 4.7 07/15/2018   HGB 9.2 (L) 07/15/2018   HCT 29.4 (L) 07/15/2018   MCV 83.8 07/15/2018   PLT 197 07/15/2018   NEUTROABS 3.1 07/12/2018    Lab Results  Component Value Date   NA 136 07/15/2018   K 3.7 07/15/2018   CL 102 07/15/2018   CO2 25 07/15/2018    Studies:  Dg Chest 2 View  Result Date: 07/14/2018 CLINICAL DATA:  Dizziness after chemotherapy. EXAM: CHEST - 2 VIEW COMPARISON:  Chest CT 06/11/2018 FINDINGS: Heart size is borderline enlarged. There is aortic atherosclerosis. Port catheter tip terminates in the distal SVC. Rounded posterior pleural based mass in the right lower lobe measuring 7.8 cm in diameter, previously noted on prior CT is without significant change allowing for differences in technique. The smaller pulmonary nodules better visualized on CT are not well visualized radiographically. No aggressive osseous lesions. No pulmonary consolidation, effusion or edema. IMPRESSION: 1. Pleural-based right lower lobe mass measuring 7.8 cm in diameter is redemonstrated. The small pulmonary masses and nodules are not well visualized radiographically nor are the mildly enlarged mediastinal lymph nodes described on CT. 2. No acute pulmonary consolidation, effusion or pneumothorax. 3. Borderline cardiomegaly with aortic atherosclerosis. Electronically Signed   By: Ashley Royalty M.D.   On: 07/14/2018 18:55   Ct Head Wo Contrast  Result Date:  07/14/2018 CLINICAL DATA:  Dizziness after chemotherapy. EXAM: CT HEAD WITHOUT CONTRAST TECHNIQUE: Contiguous axial images were obtained from the base of the skull through the vertex without intravenous contrast. COMPARISON:  None. FINDINGS: Brain: Chronic age related involutional changes of the brain. Likely chronic minimal small vessel ischemic disease of periventricular white matter. No definite intra-axial mass given limitations of a noncontrast study. No evidence of vasogenic edema or midline shift. No extra-axial fluid collections. No hydrocephalus. Midline fourth ventricle basal cisterns. Brainstem and cerebellum are nonacute in appearance. Vascular: No hyperdense vessel sign. Atherosclerosis of the carotid siphons bilaterally. Skull: Negative for fracture or focal lesions. Sinuses/Orbits: No acute finding. Other: None. IMPRESSION: Mild involutional changes of the brain with chronic appearing small vessel ischemic disease. No acute intracranial abnormality. Electronically Signed   By: Ashley Royalty M.D.   On: 07/14/2018 18:58   Ct Angio Chest Pe W And/or Wo Contrast  Result Date: 07/14/2018 CLINICAL DATA:  Syncope this morning with dyspnea for several weeks. EXAM: CT ANGIOGRAPHY CHEST WITH CONTRAST TECHNIQUE: Multidetector CT imaging of the chest was performed using the standard protocol during bolus administration of intravenous contrast. Multiplanar CT image reconstructions and MIPs were obtained to evaluate the vascular anatomy. CONTRAST:  25m ISOVUE-370 IOPAMIDOL (ISOVUE-370) INJECTION 76% COMPARISON:  CXR 07/14/2018, chest CT 06/11/2018 FINDINGS: Cardiovascular: Conventional branch pattern of the great vessels with atherosclerotic origins. Nonaneurysmal thoracic aorta  to 3.5 cm. Satisfactory opacification of the pulmonary arteries with bilateral pulmonary artery starting from the distal most aspect of the main right and left pulmonary arteries extending into the lobar and segmental branches  bilaterally. RV/LV ratio approximately 1.33 consistent with right heart failure. Heart size is enlarged. There left main coronary arteriosclerosis. Heart size is normal without pericardial effusion or thickening. Mediastinum/Nodes: Mediastinal lymphadenopathy is redemonstrated with a 1.4 cm short axis lymph node in the right upper paratracheal portion versus 1.5 cm previously. Additional right lower paratracheal/precarinal lymph node measuring 1.7 cm short axis unchanged in appearance, series 4/39 versus series 2/28. No hilar lymphadenopathy. Moderate hiatal hernia. No thyromegaly or mass. The esophagus is unremarkable. Patent midline trachea and mainstem bronchi. Lungs/Pleura: Interval increase in size of dominant mass, pleural based in the posterior right lower lobe measuring 7.8 x 5.8 cm presently versus to 7.3 cm previously. New areas of necrosis are identified centrally with mottled foci of gas seen within. Trace pleural effusions are present. Additional pulmonary nodules are identified as follows: 1. Left upper lobe pleural-based 11 mm nodule versus 10 mm previously, series 10/31 versus series 6/38. 2. Left upper lobe 4 mm centrally located pulmonary nodule, series 10/40 versus 6 mm previously series 6/48. 3. Left upper lobe cavitary nodule measuring 1.4 cm, series 10/52 versus 1.5 cm previously on series 6/63. 4. Left upper lobe 10 mm nodule abutting the major fissure, series 10/58 unchanged. 5. Right upper lobe posterior 18 mm nodule versus 21 mm previously, series 10/41 versus series 6/46. 6. Right upper lobe subpleural lateral nodule measuring 4 mm unchanged in appearance, series 10/61. 7. Right upper lobe posterior 6 mm cavitary nodule unchanged in appearance, series 10/64. 8. Right lower lobe partially cavitary 4 mm nodule adjacent to the major fissure, unchanged, series 10/93. Upper Abdomen: Partially included markedly dilated appearing right renal collecting system with cortical thinning and  mild-to-moderate dilatation of the left renal collecting system. Subtle hypodensity in the right hepatic lobe measuring 12 mm in diameter, series 4/75, similar in appearance bed nonspecific. Metastatic focus is not excluded. Cholecystectomy is seen. Musculoskeletal: No aggressive osseous lesions. Review of the MIP images confirms the above findings. IMPRESSION: 1. Positive for acute bilateral PE with CT evidence of right heart strain (RV/LV Ratio = 1.33) consistent with at least submassive (intermediate risk) PE. The presence of right heart strain has been associated with an increased risk of morbidity and mortality. Please activate Code PE by paging [phone]. 2. Interval increase in size of pleural-based hypodense pulmonary mass in the right lower lobe now with central areas of necrosis demonstrated. 3. Additional bilateral pulmonary nodules as above described with equivocal overall change some which appear to have decreased in size and is slightly increased. 4. Redemonstration of mediastinal lymphadenopathy also without significant progression. These results were called by telephone at the time of interpretation on 07/14/2018 at 10:28 pm to Dr. Nanda Quinton , who verbally acknowledged these results. Aortic Atherosclerosis (ICD10-I70.0). Electronically Signed   By: Ashley Royalty M.D.   On: 07/14/2018 22:28

## 2018-07-15 NOTE — Progress Notes (Signed)
ANTICOAGULATION CONSULT NOTE - Initial Consult  Pharmacy Consult for IV heparin Indication: pulmonary embolus  Allergies  Allergen Reactions  . Levofloxacin Other (See Comments)    Unknown  . Promethazine Hcl Other (See Comments)    fainting  . Sulfa Antibiotics Swelling  . Sulfa Drugs Cross Reactors Swelling  . Sulfur Swelling    Patient Measurements: Height: 5\' 6"  (167.6 cm) Weight: 180 lb (81.6 kg) IBW/kg (Calculated) : 59.3 Heparin Dosing Weight: 66 kg  Vital Signs: Temp: 97.6 F (36.4 C) (10/16 1606) Temp Source: Oral (10/16 1606) BP: 149/92 (10/16 2100) Pulse Rate: 92 (10/16 2100)  Labs: Recent Labs    07/12/18 0927 07/14/18 1655 07/14/18 2005 07/14/18 2240  HGB 10.3* 9.5*  --   --   HCT 32.3* 31.1*  --   --   PLT 252 206  --   --   APTT  --   --   --  25  LABPROT  --   --   --  13.7  INR  --   --   --  1.06  CREATININE 1.19* 1.24*  --   --   TROPONINI  --  0.72* 0.78*  --     Estimated Creatinine Clearance: 38.3 mL/min (A) (by C-G formula based on SCr of 1.24 mg/dL (H)).   Medical History: Past Medical History:  Diagnosis Date  . Aortic atherosclerosis ([place])   . Arthritis   . Arthritis of knee, left   . Borderline osteopenia DEXA 2006 and 2012  . Cancer [facility])    endometrial  . Cataract   . Cholesterol serum elevated   . Colon polyps   . DDD (degenerative disc disease), lumbar   . DDD (degenerative disc disease), lumbar   . Diverticulitis of sigmoid colon    Moderate  . Facet arthropathy, lumbar   . FHx: colon cancer   . GERD (gastroesophageal reflux disease)   . History of hiatal hernia    Moderate  . History of radiation therapy 08/06/16 - 09/03/16   vaginal cuff treated to 30 Gy in 5 fractions  . Hypothyroidism   . Kidney lesion, native, right   . Left foot drop   . Leukopenia   . Neuropathy    chemo indiced ; in right hand and bilateral feet   . Obesity   . Pelvic abscess in female 01/2017   Right  . Spondylisthesis   .  Umbilical hernia     Medications:  Scheduled:  . dexamethasone  4 mg Oral Daily  . dronabinol  2.5 mg Oral BID AC  . DULoxetine  30 mg Oral QHS  . iopamidol      . levothyroxine  88 mcg Oral QAC breakfast  . metoCLOPramide  10 mg Oral TID AC  . polyethylene glycol  17 g Oral Daily  . sodium chloride      . triamcinolone  1 spray Nasal BID  . vitamin B-12  1,000 mcg Oral Daily   Infusions:    Assessment: 86 yoF c/o dizziness since chemo on 10/14. CT reveals bilateral PE with right heart strain. IV heparin per Rx.   Baseline labs: H/H=9.5/31.1, Plts=206, INR=1.06, aptt=25.  Trop = 0.72>0.78.     Plan:   Heparin 3000 unit bolus x1 now Start drip at 1100 units/hr Daily CBC/HL Check 1st HL in 8 hours  Lawana Pai R 07/15/2018,12:10 AM

## 2018-07-15 NOTE — Progress Notes (Signed)
ANTICOAGULATION CONSULT NOTE - Follow-Up Consult  Pharmacy Consult for IV heparin Indication: bilateral PE + DVT   Allergies  Allergen Reactions  . Levofloxacin Other (See Comments)    Unknown  . Promethazine Hcl Other (See Comments)    fainting  . Sulfa Antibiotics Swelling  . Sulfa Drugs Cross Reactors Swelling  . Sulfur Swelling    Patient Measurements: Height: 5\' 6"  (167.6 cm) Weight: 180 lb (81.6 kg) IBW/kg (Calculated) : 59.3 Heparin Dosing Weight: 76.4 kg  Vital Signs: Temp: 99.1 F (37.3 C) (10/17 1900) Temp Source: Oral (10/17 1900) BP: 156/77 (10/17 2005) Pulse Rate: 91 (10/17 2005)  Labs: Recent Labs    07/14/18 1655  07/14/18 2240 07/14/18 2323 07/15/18 0452 07/15/18 0900 07/15/18 1123 07/15/18 2013  HGB 9.5*  --   --   --  9.2*  --   --   --   HCT 31.1*  --   --   --  29.4*  --   --   --   PLT 206  --   --   --  197  --   --   --   APTT  --   --  25  --   --   --   --   --   LABPROT  --   --  13.7  --   --   --   --   --   INR  --   --  1.06  --   --   --   --   --   HEPARINUNFRC  --   --   --   --   --  0.72*  --  0.39  CREATININE 1.24*  --   --   --  1.03*  --   --   --   TROPONINI 0.72*   < >  --  0.77* 0.58*  --  0.39*  --    < > = values in this interval not displayed.    Estimated Creatinine Clearance: 46.1 mL/min (A) (by C-G formula based on SCr of 1.03 mg/dL (H)).   Medical History: Past Medical History:  Diagnosis Date  . Aortic atherosclerosis ([place])   . Arthritis   . Arthritis of knee, left   . Borderline osteopenia DEXA 2006 and 2012  . Cancer [facility])    endometrial  . Cataract   . Cholesterol serum elevated   . Colon polyps   . DDD (degenerative disc disease), lumbar   . DDD (degenerative disc disease), lumbar   . Diverticulitis of sigmoid colon    Moderate  . Facet arthropathy, lumbar   . FHx: colon cancer   . GERD (gastroesophageal reflux disease)   . History of hiatal hernia    Moderate  . History of radiation  therapy 08/06/16 - 09/03/16   vaginal cuff treated to 30 Gy in 5 fractions  . Hypothyroidism   . Kidney lesion, native, right   . Left foot drop   . Leukopenia   . Neuropathy    chemo indiced ; in right hand and bilateral feet   . Obesity   . Pelvic abscess in female 01/2017   Right  . Spondylisthesis   . Umbilical hernia     Assessment: Kayla Guerrero with PMH of metastatic endometrial cancer who presented to the ED 07/14/18 after two syncopal episodes. CTa of chest positive for bilateral PE with right heart strain. Pharmacy consulted for IV heparin dosing.   Today, 07/15/18  Bilateral  lower extremity venous duplex also positive for DVT involving the right popliteal, right posterior tibial, right peroneal, and left femoral veins.   First heparin level = 0.72 units/mL, slightly supratherapeutic on heparin infusion at 1100 units/hr   CBC: Hgb decreased to 9.2, Pltc WNL  No bleeding or complications of therapy noted per nursing  9:20 PM Heparin level = 0.39 on 1000 units/hr.  No bleeding noted per discussion with RN.  Goal of Therapy:  Heparin level 0.3-0.7 units/ml Monitor platelets by anticoagulation protocol: Yes    Plan:   Continue heparin infusion to 1000 units/hr  Daily CBC and heparin level  Monitor closely for s/sx of bleeding  F/u plans for long-term anticoagulation  Peggyann Juba, PharmD, BCPS Pager: [phone] 07/15/2018 9:21 PM

## 2018-07-15 NOTE — Progress Notes (Signed)
*  Preliminary Results* Bilateral lower extremity venous duplex completed. Bilateral lower extremities are positive for acute deep vein thrombosis involving the right popliteal, right posterior tibial, right peroneal, and left femoral veins. There is no evidence of Baker's cyst bilaterally.  Preliminary results discussed with Dr. Lonny Prude.  07/15/2018 9:58 AM Maudry Mayhew, MHA, RVT, RDCS, RDMS

## 2018-07-15 NOTE — Progress Notes (Signed)
ANTICOAGULATION CONSULT NOTE - Follow-Up Consult  Pharmacy Consult for IV heparin Indication: bilateral PE + DVT   Allergies  Allergen Reactions  . Levofloxacin Other (See Comments)    Unknown  . Promethazine Hcl Other (See Comments)    fainting  . Sulfa Antibiotics Swelling  . Sulfa Drugs Cross Reactors Swelling  . Sulfur Swelling    Patient Measurements: Height: 5\' 6"  (167.6 cm) Weight: 180 lb (81.6 kg) IBW/kg (Calculated) : 59.3 Heparin Dosing Weight: 76.4 kg  Vital Signs: Temp: 97.7 F (36.5 C) (10/17 0800) Temp Source: Oral (10/17 0800) BP: 145/87 (10/17 0700) Pulse Rate: 79 (10/17 0700)  Labs: Recent Labs    07/14/18 1655 07/14/18 2005 07/14/18 2240 07/14/18 2323 07/15/18 0452 07/15/18 0900  HGB 9.5*  --   --   --  9.2*  --   HCT 31.1*  --   --   --  29.4*  --   PLT 206  --   --   --  197  --   APTT  --   --  25  --   --   --   LABPROT  --   --  13.7  --   --   --   INR  --   --  1.06  --   --   --   HEPARINUNFRC  --   --   --   --   --  0.72*  CREATININE 1.24*  --   --   --  1.03*  --   TROPONINI 0.72* 0.78*  --  0.77* 0.58*  --     Estimated Creatinine Clearance: 46.1 mL/min (A) (by C-G formula based on SCr of 1.03 mg/dL (H)).   Medical History: Past Medical History:  Diagnosis Date  . Aortic atherosclerosis ([place])   . Arthritis   . Arthritis of knee, left   . Borderline osteopenia DEXA 2006 and 2012  . Cancer [facility])    endometrial  . Cataract   . Cholesterol serum elevated   . Colon polyps   . DDD (degenerative disc disease), lumbar   . DDD (degenerative disc disease), lumbar   . Diverticulitis of sigmoid colon    Moderate  . Facet arthropathy, lumbar   . FHx: colon cancer   . GERD (gastroesophageal reflux disease)   . History of hiatal hernia    Moderate  . History of radiation therapy 08/06/16 - 09/03/16   vaginal cuff treated to 30 Gy in 5 fractions  . Hypothyroidism   . Kidney lesion, native, right   . Left foot drop   .  Leukopenia   . Neuropathy    chemo indiced ; in right hand and bilateral feet   . Obesity   . Pelvic abscess in female 01/2017   Right  . Spondylisthesis   . Umbilical hernia     Assessment: 30 yoF with PMH of metastatic endometrial cancer who presented to the ED 07/14/18 after two syncopal episodes. CTa of chest positive for bilateral PE with right heart strain. Pharmacy consulted for IV heparin dosing.   Today, 07/15/18  Bilateral lower extremity venous duplex also positive for DVT involving the right popliteal, right posterior tibial, right peroneal, and left femoral veins.   First heparin level = 0.72 units/mL, slightly supratherapeutic on heparin infusion at 1100 units/hr   CBC: Hgb decreased to 9.2, Pltc WNL  No bleeding or complications of therapy noted per nursing  Goal of Therapy:  Heparin level 0.3-0.7 units/ml Monitor platelets  by anticoagulation protocol: Yes    Plan:   Decrease heparin infusion to 1000 units/hr  Heparin level 8 hours after rate change  Daily CBC and heparin level  Monitor closely for s/sx of bleeding  Discussed above plan with nursing  F/u plans for long-term anticoagulation   Lindell Spar, PharmD, BCPS Pager: [phone] 07/15/2018 11:49 AM

## 2018-07-15 NOTE — Progress Notes (Signed)
  Echocardiogram 2D Echocardiogram has been performed.  Kayla Guerrero R 07/15/2018, 10:04 AM

## 2018-07-16 DIAGNOSIS — I10 Essential (primary) hypertension: Secondary | ICD-10-CM | POA: Diagnosis present

## 2018-07-16 DIAGNOSIS — I82403 Acute embolism and thrombosis of unspecified deep veins of lower extremity, bilateral: Secondary | ICD-10-CM

## 2018-07-16 DIAGNOSIS — D509 Iron deficiency anemia, unspecified: Secondary | ICD-10-CM

## 2018-07-16 LAB — URINE CULTURE
Culture: NO GROWTH
Special Requests: NORMAL

## 2018-07-16 LAB — CBC
HCT: 23.1 % — ABNORMAL LOW (ref 36.0–46.0)
HCT: 30.2 % — ABNORMAL LOW (ref 36.0–46.0)
Hemoglobin: 7.1 g/dL — ABNORMAL LOW (ref 12.0–15.0)
Hemoglobin: 9.5 g/dL — ABNORMAL LOW (ref 12.0–15.0)
MCH: 26.8 pg (ref 26.0–34.0)
MCH: 26.5 pg (ref 26.0–34.0)
MCHC: 30.7 g/dL (ref 30.0–36.0)
MCHC: 31.5 g/dL (ref 30.0–36.0)
MCV: 87.2 fL (ref 80.0–100.0)
MCV: 84.4 fL (ref 80.0–100.0)
PLATELETS: 142 10*3/uL — AB (ref 150–400)
PLATELETS: 190 10*3/uL (ref 150–400)
RBC: 2.65 MIL/uL — ABNORMAL LOW (ref 3.87–5.11)
RBC: 3.58 MIL/uL — AB (ref 3.87–5.11)
RDW: 16.4 % — ABNORMAL HIGH (ref 11.5–15.5)
RDW: 16 % — ABNORMAL HIGH (ref 11.5–15.5)
WBC: 3.2 10*3/uL — ABNORMAL LOW (ref 4.0–10.5)
WBC: 3.9 10*3/uL — ABNORMAL LOW (ref 4.0–10.5)
nRBC: 0 % (ref 0.0–0.2)
nRBC: 0 % (ref 0.0–0.2)

## 2018-07-16 LAB — HEPARIN LEVEL (UNFRACTIONATED): Heparin Unfractionated: 0.51 IU/mL (ref 0.30–0.70)

## 2018-07-16 MED ORDER — SODIUM CHLORIDE 0.9% FLUSH
10.0000 mL | INTRAVENOUS | Status: DC | PRN
  Administered 2018-07-17 (×2): 10 mL
  Filled 2018-07-16 (×2): qty 40

## 2018-07-16 MED ORDER — ENOXAPARIN (LOVENOX) PATIENT EDUCATION KIT
PACK | Freq: Once | Status: AC
  Administered 2018-07-17: 11:00:00
  Filled 2018-07-16 (×2): qty 1

## 2018-07-16 MED ORDER — AMLODIPINE BESYLATE 10 MG PO TABS
10.0000 mg | ORAL_TABLET | Freq: Every day | ORAL | Status: DC
  Administered 2018-07-16 – 2018-07-17 (×2): 10 mg via ORAL
  Filled 2018-07-16 (×2): qty 1

## 2018-07-16 MED ORDER — METOCLOPRAMIDE HCL 5 MG PO TABS
5.0000 mg | ORAL_TABLET | Freq: Three times a day (TID) | ORAL | Status: DC
  Administered 2018-07-16 – 2018-07-17 (×3): 5 mg via ORAL
  Filled 2018-07-16 (×3): qty 1

## 2018-07-16 MED ORDER — ENOXAPARIN SODIUM 80 MG/0.8ML ~~LOC~~ SOLN
1.0000 mg/kg | Freq: Two times a day (BID) | SUBCUTANEOUS | Status: DC
  Administered 2018-07-16 – 2018-07-17 (×2): 80 mg via SUBCUTANEOUS
  Filled 2018-07-16 (×3): qty 0.8

## 2018-07-16 MED ORDER — SODIUM CHLORIDE 0.9% FLUSH
10.0000 mL | Freq: Two times a day (BID) | INTRAVENOUS | Status: DC
  Administered 2018-07-16 (×2): 10 mL

## 2018-07-16 MED ORDER — CHLORHEXIDINE GLUCONATE CLOTH 2 % EX PADS: 6.0000 | MEDICATED_PAD | Freq: Every day | CUTANEOUS | Status: DC

## 2018-07-16 NOTE — Progress Notes (Signed)
Pt transferred to 4E. Report given to [employer], Therapist, sports.

## 2018-07-16 NOTE — Progress Notes (Addendum)
PROGRESS NOTE  Kayla Guerrero WNU:[id number] DOB: 19-May-1935 DOA: 07/14/2018 PCP: Rita Ohara, MD  HPI/Brief Narrative  Kayla Guerrero is a 83 y.o. year old female with medical history significant for endometrial cancer on chemo, hypothyroidism who presented on 07/14/2018 with 2 episodes of sudden syncope after several weeks of progressive dyspnea with exertion and rest and was found to have acute bilateral PE.  Subjective Continues to deny chest pain, SOB, or cough. No bleeding.     Assessment/Plan:  #Acute bilateral submassive PE and bilateral DVT, stable.  Very likely provoked in the setting of malignancy (also had a recent knee surgery in July 2019).  Patient is doing very well and remains hemodynamically stable.  TTE does show right ventricular pressure and volume overload; however patient is not a lytic candidate per critical care recommendations given hemodynamically stable and poor functional status (endometrial cancer with peritoneal mets) per critical care consultants (currently signed off). -Plan to transfer out of stepdown unit to floor and monitor over the next 24 hours to ensure no bleeding or changes in hemoglobin while on anticoagulation.   -Will transition from IV heparin to Lovenox (better anticoagulation choice.   -On oxygen currently but seems more likely related to comfort patient has normal oxygen saturation, ambulatory oxygen test  #Chronic anemia, very likely iron deficiency O (based off iron labs in September 2019) baseline hemoglobin 9-10.  Hemoglobin on 10/18 initially 7, most likely lab error as repeat CBC showed hemoglobin of 9.5, patient has had no signs or symptoms of bleeding.  Continue daily CBC  #Hypertension, new diagnosis? Likely related to decadron use. SBP 160-170. No home BP meds. IV PRN hydralazine. She will likely need 2 agents given how high it is. But will start with amlodipine for now, if no improvement can consider addition of lisinopril as long  as kidney function remains the same  #Endometrial cancer.  Followed by Dr. Leslee Home on admission), undergoing current chemotherapy, continue Decadron   #Cancer associated pain.  Stable.  Continue oral Dilaudid  #Chronic nausea, stable  No emesis.  Continue low-dose dexamethasone (started by oncologist) and scheduled reglan, PRN antiemetics.    #Chemotherapy-induced neuropathy, stable.  Continue home Cymbalta.  #B12 deficiency, chronic.  Continue oral vitamin B12 supplementation   Code Status: Full code,  Family Communication: No family at bedside  Disposition Plan: Need to transition from IV heparin to Lovenox and monitor to ensure no bleeding,   ambulatory oxygen test, likely discharge in 24 hours.    Consultants:  Oncology, critical care  Procedures:  DVT ultrasound  TTE  Antimicrobials: Anti-infectives (From admission, onward)   None        Cultures:  none  Telemetry:none  DVT prophylaxis:  IV heparin   Objective: Vitals:   07/16/18 1200 07/16/18 1300 07/16/18 1345 07/16/18 1400  BP: (!) 170/84 (!) 207/88 131/62 137/68  Pulse: 80 (!) 101 100 89  Resp: 11 16 16 11   Temp: 98.2 F (36.8 C)     TempSrc: Oral     SpO2: 96% 95% 98% 95%  Weight:      Height:        Intake/Output Summary (Last 24 hours) at 07/16/2018 1458 Last data filed at 07/16/2018 1437 Gross per 24 hour  Intake 368.89 ml  Output 2150 ml  Net -1781.11 ml   Filed Weights   07/14/18 1606  Weight: 81.6 kg    Exam:  Gen- elderly female, lying comfortably in bed, no distress Eyes-anicteric sclera, EOMI ENMT-  moist oral mucosa CV-RRR, no appreciable murmurs, rubs, or gallops, no peripheral edema Abd- soft, non-distended, non-tender, normal bowel sounds Resp- 2L [place] O2, clear breath sounds, normal respiratory effort Skin- no wounds, intact, good turgor  Data Reviewed: CBC: Recent Labs  Lab 07/12/18 0927 07/14/18 1655 07/15/18 0452 07/16/18 0500 07/16/18 0806    WBC 4.1 5.1 4.7 3.2* 3.9*  NEUTROABS 3.1  --   --   --   --   HGB 10.3* 9.5* 9.2* 7.1* 9.5*  HCT 32.3* 31.1* 29.4* 23.1* 30.2*  MCV 83.7 85.9 83.8 87.2 84.4  PLT 252 206 197 142* 330   Basic Metabolic Panel: Recent Labs  Lab 07/12/18 0927 07/14/18 1655 07/15/18 0452  NA 141 138 136  K 3.7 4.3 3.7  CL 106 102 102  CO2 25 25 25   GLUCOSE 97 119* 88  BUN 23 29* 24*  CREATININE 1.19* 1.24* 1.03*  CALCIUM 9.1 8.7* 7.9*   GFR: Estimated Creatinine Clearance: 46.1 mL/min (A) (by C-G formula based on SCr of 1.03 mg/dL (H)). Liver Function Tests: Recent Labs  Lab 07/12/18 0927 07/14/18 1655  AST 20 32  ALT 8 17  ALKPHOS 58 58  BILITOT 0.3 0.5  PROT 6.7 5.9*  ALBUMIN 3.6 3.1*   Recent Labs  Lab 07/14/18 1655  LIPASE 35   No results for input(s): AMMONIA in the last 168 hours. Coagulation Profile: Recent Labs  Lab 07/14/18 2240  INR 1.06   Cardiac Enzymes: Recent Labs  Lab 07/14/18 1655 07/14/18 2005 07/14/18 2323 07/15/18 0452 07/15/18 1123  TROPONINI 0.72* 0.78* 0.77* 0.58* 0.39*   BNP (last 3 results) No results for input(s): PROBNP in the last 8760 hours. HbA1C: No results for input(s): HGBA1C in the last 72 hours. CBG: Recent Labs  Lab 07/14/18 1629  GLUCAP 115*   Lipid Profile: No results for input(s): CHOL, HDL, LDLCALC, TRIG, CHOLHDL, LDLDIRECT in the last 72 hours. Thyroid Function Tests: No results for input(s): TSH, T4TOTAL, FREET4, T3FREE, THYROIDAB in the last 72 hours. Anemia Panel: No results for input(s): VITAMINB12, FOLATE, FERRITIN, TIBC, IRON, RETICCTPCT in the last 72 hours. Urine analysis:    Component Value Date/Time   COLORURINE YELLOW 07/14/2018 2004   APPEARANCEUR CLEAR 07/14/2018 2004   LABSPEC 1.014 07/14/2018 2004   LABSPEC 1.030 06/10/2018 1157   PHURINE 6.0 07/14/2018 2004   GLUCOSEU NEGATIVE 07/14/2018 2004   HGBUR NEGATIVE 07/14/2018 2004   BILIRUBINUR NEGATIVE 07/14/2018 2004   BILIRUBINUR negative 06/10/2018  1157   BILIRUBINUR neg 02/16/2017 [place] 07/14/2018 2004   PROTEINUR NEGATIVE 07/14/2018 2004   UROBILINOGEN 0.2 02/16/2017 1259   NITRITE NEGATIVE 07/14/2018 2004   LEUKOCYTESUR NEGATIVE 07/14/2018 2004   Sepsis Labs: @LABRCNTIP (procalcitonin:4,lacticidven:4)  ) Recent Results (from the past 240 hour(s))  Urine culture     Status: None   Collection Time: 07/14/18  8:04 PM  Result Value Ref Range Status   Specimen Description   Final    URINE, CLEAN CATCH Performed at [facility], [place] [address] [place], [place] [address]    Special Requests   Final    Normal Performed at [facility], [place] [address] [place], [place] [address]    Culture   Final    NO GROWTH Performed at [facility], [place] [address] [place], [place] [address]    Report Status 07/16/2018 FINAL  Final  MRSA PCR Screening     Status: None   Collection Time: 07/15/18  3:00 AM  Result Value Ref Range Status   MRSA by PCR NEGATIVE NEGATIVE Final    Comment:        The GeneXpert MRSA Assay (FDA approved for NASAL specimens only), is one component of a comprehensive MRSA colonization surveillance program. It is not intended to diagnose MRSA infection nor to guide or monitor treatment for MRSA infections. Performed at [facility], [place] [address] [place], [place] [address]       Studies: No results found.  Scheduled Meds: . Chlorhexidine Gluconate Cloth  6 each Topical Daily  . dexamethasone  4 mg Oral Q breakfast  . dronabinol  2.5 mg Oral BID AC  . DULoxetine  30 mg Oral QHS  . enoxaparin (LOVENOX) injection  1 mg/kg Subcutaneous Q12H  . enoxaparin   Does not apply Once  . fluticasone  1 spray Each Nare BID  . levothyroxine  88 mcg Oral QAC breakfast  . mouth rinse  15 mL Mouth Rinse BID  . metoCLOPramide  5 mg Oral TID AC  . polyethylene glycol  17 g Oral Daily  . sodium chloride flush  10-40 mL  Intracatheter Q12H  . vitamin B-12  1,000 mcg Oral Daily    Continuous Infusions: . heparin 1,000 Units/hr (07/16/18 0600)     LOS: 2 days     Desiree Hane, MD [facility] Pager [phone]  If 7PM-7AM, please contact night-coverage [email or web address] Password [email or web address] 07/16/2018, 2:57 PM

## 2018-07-16 NOTE — Progress Notes (Addendum)
ANTICOAGULATION CONSULT NOTE - Follow-Up Consult  Pharmacy Consult for IV heparin Indication: bilateral PE + DVT   Allergies  Allergen Reactions  . Levofloxacin Other (See Comments)    Unknown  . Promethazine Hcl Other (See Comments)    fainting  . Sulfa Antibiotics Swelling  . Sulfa Drugs Cross Reactors Swelling  . Sulfur Swelling    Patient Measurements: Height: '5\' 6"'$  (167.6 cm) Weight: 180 lb (81.6 kg) IBW/kg (Calculated) : 59.3 Heparin Dosing Weight: 76.4 kg  Vital Signs: Temp: 98.5 F (36.9 C) (10/18 0400) Temp Source: Oral (10/18 0400) BP: 156/77 (10/17 2005) Pulse Rate: 91 (10/17 2005)  Labs: Recent Labs    07/14/18 1655  07/14/18 2240 07/14/18 2323 07/15/18 0452 07/15/18 0900 07/15/18 1123 07/15/18 2013 07/16/18 0500  HGB 9.5*  --   --   --  9.2*  --   --   --  7.1*  HCT 31.1*  --   --   --  29.4*  --   --   --  23.1*  PLT 206  --   --   --  197  --   --   --  142*  APTT  --   --  25  --   --   --   --   --   --   LABPROT  --   --  13.7  --   --   --   --   --   --   INR  --   --  1.06  --   --   --   --   --   --   HEPARINUNFRC  --   --   --   --   --  0.72*  --  0.39 0.51  CREATININE 1.24*  --   --   --  1.03*  --   --   --   --   TROPONINI 0.72*   < >  --  0.77* 0.58*  --  0.39*  --   --    < > = values in this interval not displayed.    Estimated Creatinine Clearance: 46.1 mL/min (A) (by C-G formula based on SCr of 1.03 mg/dL (H)).   Medical History: Past Medical History:  Diagnosis Date  . Aortic atherosclerosis ([place])   . Arthritis   . Arthritis of knee, left   . Borderline osteopenia DEXA 2006 and 2012  . Cancer [facility])    endometrial  . Cataract   . Cholesterol serum elevated   . Colon polyps   . DDD (degenerative disc disease), lumbar   . DDD (degenerative disc disease), lumbar   . Diverticulitis of sigmoid colon    Moderate  . Facet arthropathy, lumbar   . FHx: colon cancer   . GERD (gastroesophageal reflux disease)   . History  of hiatal hernia    Moderate  . History of radiation therapy 08/06/16 - 09/03/16   vaginal cuff treated to 30 Gy in 5 fractions  . Hypothyroidism   . Kidney lesion, native, right   . Left foot drop   . Leukopenia   . Neuropathy    chemo indiced ; in right hand and bilateral feet   . Obesity   . Pelvic abscess in female 01/2017   Right  . Spondylisthesis   . Umbilical hernia     Assessment: 86 yoF with PMH of metastatic endometrial cancer who presented to the ED 07/14/18 after two syncopal episodes. CTa of  chest positive for bilateral PE with right heart strain. Pharmacy consulted for IV heparin dosing with plan to transition to Lovenox upon discharge  Today, 07/16/18   HL remains at goal with infusion at 1000 units/hr  Hgb decreased to 7.1 (9.2), but repeat testing at 9.5  Spoke to RN, no sign of bleeding   Goal of Therapy:  Heparin level 0.3-0.7 units/ml Monitor platelets by anticoagulation protocol: Yes    Plan:   Continue heparin infusion at 1000 units/hr  Daily CBC and heparin level  Monitor closely for s/sx of bleeding  F/u plans for long-term anticoagulation  Ulice Dash, PharmD Clinical Pharmacist Pager # [phone]  07/15/2018 7:01 AM  07/16/18 1:59 PM Addendum:  Consulted to convert patient to Lovenox. Will discontinue heparin drip at 1900. Lovenox 80 mg q12h to start at 2000. Lovenox teaching kit ordered.   Ulice Dash, PharmD Clinical Pharmacist Pager # [phone]

## 2018-07-17 LAB — CBC
HEMATOCRIT: 29.7 % — AB (ref 36.0–46.0)
Hemoglobin: 9.4 g/dL — ABNORMAL LOW (ref 12.0–15.0)
MCH: 26.9 pg (ref 26.0–34.0)
MCHC: 31.6 g/dL (ref 30.0–36.0)
MCV: 84.9 fL (ref 80.0–100.0)
PLATELETS: 191 10*3/uL (ref 150–400)
RBC: 3.5 MIL/uL — ABNORMAL LOW (ref 3.87–5.11)
RDW: 16.1 % — AB (ref 11.5–15.5)
WBC: 4.2 10*3/uL (ref 4.0–10.5)
nRBC: 0 % (ref 0.0–0.2)

## 2018-07-17 LAB — BASIC METABOLIC PANEL
Anion gap: 7 (ref 5–15)
BUN: 22 mg/dL (ref 8–23)
CO2: 28 mmol/L (ref 22–32)
CREATININE: 1.13 mg/dL — AB (ref 0.44–1.00)
Calcium: 8.2 mg/dL — ABNORMAL LOW (ref 8.9–10.3)
Chloride: 100 mmol/L (ref 98–111)
GFR calc Af Amer: 51 mL/min — ABNORMAL LOW (ref >60)
GFR, EST NON AFRICAN AMERICAN: 44 mL/min — AB (ref >60)
GLUCOSE: 87 mg/dL (ref 70–99)
POTASSIUM: 3.6 mmol/L (ref 3.5–5.1)
SODIUM: 135 mmol/L (ref 135–145)

## 2018-07-17 MED ORDER — ENOXAPARIN SODIUM 80 MG/0.8ML ~~LOC~~ SOLN: 1.0000 mg/kg | Freq: Two times a day (BID) | SUBCUTANEOUS | 0 refills | Status: DC

## 2018-07-17 MED ORDER — HEPARIN SOD (PORK) LOCK FLUSH 100 UNIT/ML IV SOLN
500.0000 [IU] | INTRAVENOUS | Status: AC | PRN
  Administered 2018-07-17: 500 [IU]

## 2018-07-17 MED ORDER — AMLODIPINE BESYLATE 10 MG PO TABS: 10.0000 mg | ORAL_TABLET | Freq: Every day | ORAL | 0 refills | Status: AC

## 2018-07-17 NOTE — Discharge Summary (Signed)
Physician Discharge Summary  Kayla Guerrero NAT:[id number] DOB: 1934-12-18 DOA: 07/14/2018  PCP: Rita Ohara, MD  Admit date: 07/14/2018 Discharge date: 07/17/2018  Admitted From: Home Disposition:  Home  Discharge Condition:Stable CODE STATUS:FULL Diet recommendation: Heart Healthy  Brief/Interim Summary: Kayla Guerrero is a 83 y.o. year old female with medical history significant for endometrial cancer on chemo, hypothyroidism who presented on 07/14/2018 with 2 episodes of sudden syncope after several weeks of progressive dyspnea with exertion and rest and was found to have acute bilateral PE.  Patient was started on therapeutic Lovenox.  She initially required oxygen supplementation for maintenance of saturation but currently she is fine off oxygen.She is stable for discharge today to home.  She has appointment with her oncologist on Tuesday.  Following problems were addressed during her hospitalization:  #Acute bilateral submassive PE and bilateral DVT: Very likely provoked in the setting of malignancy (also had a recent knee surgery in July 2019).  Patient is doing very well and remains hemodynamically stable.  TTE does show right ventricular pressure and volume overload; however patient is not a lytic candidate per critical care recommendations given hemodynamically stable and poor functional status (endometrial cancer with peritoneal mets) per critical care consultants (currently signed off).n.   -Transitioned from IV heparin to Lovenox (better anticoagulation choice.   -Off oxygen  #Chronic anemia: Patient has had no signs or symptoms of bleeding.  Likely associated with her malignancy.  #Hypertension:Likely related to decadron use. No home BP meds. Started on amlodipine.  #Endometrial cancer:  Followed by Dr. Leslee Home on admission), undergoing current chemotherapy, continue Decadron .  #Cancer associated pain:  Stable.  Continue home pain meds.  #Chronic  nausea: stable  No emesis.  Continue low-dose dexamethasone (started by oncologist) .  #Chemotherapy-induced neuropathy, stable.: Continue home Cymbalta.  #B12 deficiency: chronic.  Continue oral vitamin B12 supplementation   Discharge Diagnoses:  Principal Problem:   Pulmonary embolism with acute cor pulmonale (HCC) Active Problems:   Malignant neoplasm of endometrium (HCC)   Syncope and collapse   Anemia   Metastasis to lung (HCC)   Metastasis to liver (HCC)   Peritoneal metastases (HCC)   Leg DVT (deep venous thromboembolism), acute, bilateral ([place])   Hypertension    Discharge Instructions  Discharge Instructions    Diet - low sodium heart healthy   Complete by:  As directed    Discharge instructions   Complete by:  As directed    1) Take prescribed medication as instructed. 2)Follow up with Dr Alvy Bimler on Tuesday.   Increase activity slowly   Complete by:  As directed      Allergies as of 07/17/2018      Reactions   Levofloxacin Other (See Comments)   Unknown   Promethazine Hcl Other (See Comments)   fainting   Sulfa Antibiotics Swelling   Sulfa Drugs Cross Reactors Swelling   Sulfur Swelling      Medication List    TAKE these medications   amLODipine 10 MG tablet Commonly known as:  NORVASC Take 1 tablet (10 mg total) by mouth daily. Start taking on:  07/18/2018   dexamethasone 4 MG tablet Commonly known as:  DECADRON Take 1 tablet (4 mg total) by mouth daily. What changed:  additional instructions   dronabinol 2.5 MG capsule Commonly known as:  MARINOL Take 1 capsule (2.5 mg total) by mouth 2 (two) times daily before lunch and supper.   DULoxetine 30 MG capsule Commonly known as:  CYMBALTA Take 30  mg by mouth at bedtime.   enoxaparin 80 MG/0.8ML injection Commonly known as:  LOVENOX Inject 0.8 mLs (80 mg total) into the skin every 12 (twelve) hours.   HYDROmorphone 2 MG tablet Commonly known as:  DILAUDID Take 1 tablet (2 mg total) by  mouth every 4 (four) hours as needed for severe pain.   levothyroxine 88 MCG tablet Commonly known as:  SYNTHROID, LEVOTHROID TAKE ONE TABLET BY MOUTH ONCE DAILY BEFORE  BREAKFAST.   lidocaine-prilocaine cream Commonly known as:  EMLA Apply 1 application topically as needed.   Melatonin 5 MG Caps Take 5 mg by mouth at bedtime.   metoCLOPramide 10 MG tablet Commonly known as:  REGLAN Take 1 tablet (10 mg total) by mouth 3 (three) times daily before meals.   ondansetron 8 MG tablet Commonly known as:  ZOFRAN Take 1 tablet (8 mg total) by mouth every 8 (eight) hours as needed for nausea.   polyethylene glycol packet Commonly known as:  MIRALAX / GLYCOLAX Take 17 g by mouth daily.   SYSTANE ULTRA 0.4-0.3 % Soln Generic drug:  Polyethyl Glycol-Propyl Glycol Place 1 drop into both eyes daily as needed (for dry eyes).   triamcinolone 55 MCG/ACT Aero nasal inhaler Commonly known as:  NASACORT Place 1 spray into the nose 2 (two) times daily.   vitamin B-12 1000 MCG tablet Commonly known as:  CYANOCOBALAMIN Take 1,000 mcg by mouth daily.      Follow-up Information    [name], MD Follow up.   Specialty:  Hematology and Oncology Why:  Appointment on coming tuesday Contact information: [place] [address] [phone]          Allergies  Allergen Reactions  . Levofloxacin Other (See Comments)    Unknown  . Promethazine Hcl Other (See Comments)    fainting  . Sulfa Antibiotics Swelling  . Sulfa Drugs Cross Reactors Swelling  . Sulfur Swelling    Consultations: Oncology  Procedures/Studies: Dg Chest 2 View  Result Date: 07/14/2018 CLINICAL DATA:  Dizziness after chemotherapy. EXAM: CHEST - 2 VIEW COMPARISON:  Chest CT 06/11/2018 FINDINGS: Heart size is borderline enlarged. There is aortic atherosclerosis. Port catheter tip terminates in the distal SVC. Rounded posterior pleural based mass in the right lower lobe measuring 7.8  cm in diameter, previously noted on prior CT is without significant change allowing for differences in technique. The smaller pulmonary nodules better visualized on CT are not well visualized radiographically. No aggressive osseous lesions. No pulmonary consolidation, effusion or edema. IMPRESSION: 1. Pleural-based right lower lobe mass measuring 7.8 cm in diameter is redemonstrated. The small pulmonary masses and nodules are not well visualized radiographically nor are the mildly enlarged mediastinal lymph nodes described on CT. 2. No acute pulmonary consolidation, effusion or pneumothorax. 3. Borderline cardiomegaly with aortic atherosclerosis. Electronically Signed   By: Ashley Royalty M.D.   On: 07/14/2018 18:55   Ct Head Wo Contrast  Result Date: 07/14/2018 CLINICAL DATA:  Dizziness after chemotherapy. EXAM: CT HEAD WITHOUT CONTRAST TECHNIQUE: Contiguous axial images were obtained from the base of the skull through the vertex without intravenous contrast. COMPARISON:  None. FINDINGS: Brain: Chronic age related involutional changes of the brain. Likely chronic minimal small vessel ischemic disease of periventricular white matter. No definite intra-axial mass given limitations of a noncontrast study. No evidence of vasogenic edema or midline shift. No extra-axial fluid collections. No hydrocephalus. Midline fourth ventricle basal cisterns. Brainstem and cerebellum are nonacute in appearance. Vascular: No hyperdense  vessel sign. Atherosclerosis of the carotid siphons bilaterally. Skull: Negative for fracture or focal lesions. Sinuses/Orbits: No acute finding. Other: None. IMPRESSION: Mild involutional changes of the brain with chronic appearing small vessel ischemic disease. No acute intracranial abnormality. Electronically Signed   By: Ashley Royalty M.D.   On: 07/14/2018 18:58   Ct Angio Chest Pe W And/or Wo Contrast  Result Date: 07/14/2018 CLINICAL DATA:  Syncope this morning with dyspnea for several  weeks. EXAM: CT ANGIOGRAPHY CHEST WITH CONTRAST TECHNIQUE: Multidetector CT imaging of the chest was performed using the standard protocol during bolus administration of intravenous contrast. Multiplanar CT image reconstructions and MIPs were obtained to evaluate the vascular anatomy. CONTRAST:  79mL ISOVUE-370 IOPAMIDOL (ISOVUE-370) INJECTION 76% COMPARISON:  CXR 07/14/2018, chest CT 06/11/2018 FINDINGS: Cardiovascular: Conventional branch pattern of the great vessels with atherosclerotic origins. Nonaneurysmal thoracic aorta to 3.5 cm. Satisfactory opacification of the pulmonary arteries with bilateral pulmonary artery starting from the distal most aspect of the main right and left pulmonary arteries extending into the lobar and segmental branches bilaterally. RV/LV ratio approximately 1.33 consistent with right heart failure. Heart size is enlarged. There left main coronary arteriosclerosis. Heart size is normal without pericardial effusion or thickening. Mediastinum/Nodes: Mediastinal lymphadenopathy is redemonstrated with a 1.4 cm short axis lymph node in the right upper paratracheal portion versus 1.5 cm previously. Additional right lower paratracheal/precarinal lymph node measuring 1.7 cm short axis unchanged in appearance, series 4/39 versus series 2/28. No hilar lymphadenopathy. Moderate hiatal hernia. No thyromegaly or mass. The esophagus is unremarkable. Patent midline trachea and mainstem bronchi. Lungs/Pleura: Interval increase in size of dominant mass, pleural based in the posterior right lower lobe measuring 7.8 x 5.8 cm presently versus to 7.3 cm previously. New areas of necrosis are identified centrally with mottled foci of gas seen within. Trace pleural effusions are present. Additional pulmonary nodules are identified as follows: 1. Left upper lobe pleural-based 11 mm nodule versus 10 mm previously, series 10/31 versus series 6/38. 2. Left upper lobe 4 mm centrally located pulmonary nodule, series  10/40 versus 6 mm previously series 6/48. 3. Left upper lobe cavitary nodule measuring 1.4 cm, series 10/52 versus 1.5 cm previously on series 6/63. 4. Left upper lobe 10 mm nodule abutting the major fissure, series 10/58 unchanged. 5. Right upper lobe posterior 18 mm nodule versus 21 mm previously, series 10/41 versus series 6/46. 6. Right upper lobe subpleural lateral nodule measuring 4 mm unchanged in appearance, series 10/61. 7. Right upper lobe posterior 6 mm cavitary nodule unchanged in appearance, series 10/64. 8. Right lower lobe partially cavitary 4 mm nodule adjacent to the major fissure, unchanged, series 10/93. Upper Abdomen: Partially included markedly dilated appearing right renal collecting system with cortical thinning and mild-to-moderate dilatation of the left renal collecting system. Subtle hypodensity in the right hepatic lobe measuring 12 mm in diameter, series 4/75, similar in appearance bed nonspecific. Metastatic focus is not excluded. Cholecystectomy is seen. Musculoskeletal: No aggressive osseous lesions. Review of the MIP images confirms the above findings. IMPRESSION: 1. Positive for acute bilateral PE with CT evidence of right heart strain (RV/LV Ratio = 1.33) consistent with at least submassive (intermediate risk) PE. The presence of right heart strain has been associated with an increased risk of morbidity and mortality. Please activate Code PE by paging [phone]. 2. Interval increase in size of pleural-based hypodense pulmonary mass in the right lower lobe now with central areas of necrosis demonstrated. 3. Additional bilateral pulmonary nodules as above described with  equivocal overall change some which appear to have decreased in size and is slightly increased. 4. Redemonstration of mediastinal lymphadenopathy also without significant progression. These results were called by telephone at the time of interpretation on 07/14/2018 at 10:28 pm to Dr. Nanda Quinton , who verbally  acknowledged these results. Aortic Atherosclerosis (ICD10-I70.0). Electronically Signed   By: Ashley Royalty M.D.   On: 07/14/2018 22:28   Ir Imaging Guided Port Insertion  Result Date: 06/25/2018 CLINICAL DATA:  Endometrial carcinoma EXAM: TUNNEL POWER PORT PLACEMENT WITH SUBCUTANEOUS POCKET UTILIZING ULTRASOUND & FLOUROSCOPY FLUOROSCOPY TIME:  18 seconds.  One mGy. MEDICATIONS AND MEDICAL HISTORY: Versed 1.5 mg, Fentanyl 75 mcg. Additional Medications: 2 g. Antibiotics were given within 2 hours of the procedure. ANESTHESIA/SEDATION: Moderate sedation time: 24 minutes. Nursing monitored the the patient during the procedure. PROCEDURE: After written informed consent was obtained, patient was placed in the supine position on angiographic table. The right neck and chest was prepped and draped in a sterile fashion. Lidocaine was utilized for local anesthesia. The right jugular vein was noted to be patent initially with ultrasound. Under sonographic guidance, a micropuncture needle was inserted into the right IJ vein (Ultrasound and fluoroscopic image documentation was performed). The needle was removed over an 018 wire which was exchanged for a Amplatz. This was advanced into the IVC. An 8-French dilator was advanced over the Amplatz. A small incision was made in the right upper chest over the anterior right second rib. Utilizing blunt dissection, a subcutaneous pocket was created in the caudal direction. The pocket was irrigated with a copious amount of sterile normal saline. The port catheter was tunneled from the chest incision, and out the neck incision. The reservoir was inserted into the subcutaneous pocket and secured with two 3-0 Ethilon stitches. A peel-away sheath was advanced over the Amplatz wire. The port catheter was cut to measure length and inserted through the peel-away sheath. The peel-away sheath was removed. The chest incision was closed with 3-0 Vicryl interrupted stitches for the subcutaneous  tissue and a running of 4-0 Vicryl subcuticular stitch for the skin. The neck incision was closed with a 4-0 Vicryl subcuticular stitch. Derma-bond was applied to both surgical incisions. The port reservoir was flushed and instilled with heparinized saline. No complications. FINDINGS: A right IJ vein Port-A-Cath is in place with its tip at the cavoatrial junction. COMPLICATIONS: None IMPRESSION: Successful 8 French right internal jugular vein power port placement with its tip at the SVC/RA junction. Electronically Signed   By: Marybelle Killings M.D.   On: 06/25/2018 11:35       Subjective: Patient seen and examined the pressure this morning.  Remains comfortable.  We tried to wean the oxygen and she did very well and saturating fine.  Stable for discharge home today.[name]  Discharge Exam: Vitals:   07/17/18 0932 07/17/18 1022  BP:  105/63  Pulse:    Resp:    Temp:    SpO2: 94%    Vitals:   07/16/18 2022 07/17/18 0454 07/17/18 0932 07/17/18 1022  BP: (!) 154/87 138/72  105/63  Pulse: 81 83    Resp: 18 20    Temp: 98 F (36.7 C) 98 F (36.7 C)    TempSrc: Oral Oral    SpO2: 99% 98% 94%   Weight:      Height:        General: Pt is alert, awake, not in acute distress Cardiovascular: RRR, S1/S2 +, no rubs, no gallops Respiratory: CTA bilaterally,  no wheezing, no rhonchi Abdominal: Soft, NT, ND, bowel sounds + Extremities: no edema, no cyanosis    The results of significant diagnostics from this hospitalization (including imaging, microbiology, ancillary and laboratory) are listed below for reference.     Microbiology: Recent Results (from the past 240 hour(s))  Urine culture     Status: None   Collection Time: 07/14/18  8:04 PM  Result Value Ref Range Status   Specimen Description   Final    URINE, CLEAN CATCH Performed at [facility], [place] [address] [place], [place] [address]    Special Requests   Final    Normal Performed at [facility], [place] [address] [place], [place] [address]    Culture   Final    NO GROWTH Performed at [facility], [place] [address], [place], [place] [address]    Report Status 07/16/2018 FINAL  Final  MRSA PCR Screening     Status: None   Collection Time: 07/15/18  3:00 AM  Result Value Ref Range Status   MRSA by PCR NEGATIVE NEGATIVE Final    Comment:        The GeneXpert MRSA Assay (FDA approved for NASAL specimens only), is one component of a comprehensive MRSA colonization surveillance program. It is not intended to diagnose MRSA infection nor to guide or monitor treatment for MRSA infections. Performed at [facility], [place] [address], [place], [place] [address]      Labs: BNP (last 3 results) No results for input(s): BNP in the last 8760 hours. Basic Metabolic Panel: Recent Labs  Lab 07/12/18 0927 07/14/18 1655 07/15/18 0452 07/17/18 0419  NA 141 138 136 135  K 3.7 4.3 3.7 3.6  CL 106 102 102 100  CO2 25 25 25 28   GLUCOSE 97 119* 88 87  BUN 23 29* 24* 22  CREATININE 1.19* 1.24* 1.03* 1.13*  CALCIUM 9.1 8.7* 7.9* 8.2*   Liver Function Tests: Recent Labs  Lab 07/12/18 0927 07/14/18 1655  AST 20 32  ALT 8 17  ALKPHOS 58 58  BILITOT 0.3 0.5  PROT 6.7 5.9*  ALBUMIN 3.6 3.1*   Recent Labs  Lab 07/14/18 1655  LIPASE 35   No results for input(s): AMMONIA in the last 168 hours. CBC: Recent Labs  Lab 07/12/18 0927 07/14/18 1655 07/15/18 0452 07/16/18 0500 07/16/18 0806 07/17/18 0419  WBC 4.1 5.1 4.7 3.2* 3.9* 4.2  NEUTROABS 3.1  --   --   --   --   --   HGB 10.3* 9.5* 9.2* 7.1* 9.5* 9.4*  HCT 32.3* 31.1* 29.4* 23.1* 30.2* 29.7*  MCV 83.7 85.9 83.8 87.2 84.4 84.9  PLT 252 206 197 142* 190 191   Cardiac Enzymes: Recent Labs  Lab 07/14/18 1655 07/14/18 2005 07/14/18 2323 07/15/18 0452 07/15/18 1123  TROPONINI 0.72* 0.78* 0.77* 0.58* 0.39*   BNP: Invalid input(s): POCBNP CBG: Recent Labs  Lab 07/14/18 1629   GLUCAP 115*   D-Dimer No results for input(s): DDIMER in the last 72 hours. Hgb A1c No results for input(s): HGBA1C in the last 72 hours. Lipid Profile No results for input(s): CHOL, HDL, LDLCALC, TRIG, CHOLHDL, LDLDIRECT in the last 72 hours. Thyroid function studies No results for input(s): TSH, T4TOTAL, T3FREE, THYROIDAB in the last 72 hours.  Invalid input(s): FREET3 Anemia work up No results for input(s): VITAMINB12, FOLATE, FERRITIN, TIBC, IRON, RETICCTPCT in the last 72 hours. Urinalysis    Component Value Date/Time  COLORURINE YELLOW 07/14/2018 2004   APPEARANCEUR CLEAR 07/14/2018 2004   LABSPEC 1.014 07/14/2018 2004   LABSPEC 1.030 06/10/2018 1157   PHURINE 6.0 07/14/2018 2004   GLUCOSEU NEGATIVE 07/14/2018 2004   HGBUR NEGATIVE 07/14/2018 2004   BILIRUBINUR NEGATIVE 07/14/2018 2004   BILIRUBINUR negative 06/10/2018 1157   BILIRUBINUR neg 02/16/2017 1259   [place] 07/14/2018 2004   PROTEINUR NEGATIVE 07/14/2018 2004   UROBILINOGEN 0.2 02/16/2017 1259   NITRITE NEGATIVE 07/14/2018 2004   LEUKOCYTESUR NEGATIVE 07/14/2018 2004   Sepsis Labs Invalid input(s): PROCALCITONIN,  WBC,  LACTICIDVEN Microbiology Recent Results (from the past 240 hour(s))  Urine culture     Status: None   Collection Time: 07/14/18  8:04 PM  Result Value Ref Range Status   Specimen Description   Final    URINE, CLEAN CATCH Performed at [facility], [place] [address] [place], [place] [address]    Special Requests   Final    Normal Performed at [facility], [place] [address] [place], [place] [address]    Culture   Final    NO GROWTH Performed at [facility], [place] [address], [place], [place] [address]    Report Status 07/16/2018 FINAL  Final  MRSA PCR Screening     Status: None   Collection Time: 07/15/18  3:00 AM  Result Value Ref Range Status   MRSA by PCR NEGATIVE NEGATIVE Final    Comment:        The GeneXpert MRSA Assay  (FDA approved for NASAL specimens only), is one component of a comprehensive MRSA colonization surveillance program. It is not intended to diagnose MRSA infection nor to guide or monitor treatment for MRSA infections. Performed at [facility], [place] [address], [place], [place] [address]     Please note: You were cared for by a hospitalist during your hospital stay. Once you are discharged, your primary care physician will handle any further medical issues. Please note that NO REFILLS for any discharge medications will be authorized once you are discharged, as it is imperative that you return to your primary care physician (or establish a relationship with a primary care physician if you do not have one) for your post hospital discharge needs so that they can reassess your need for medications and monitor your lab values.    Time coordinating discharge: 40 minutes  SIGNED:   Shelly Coss, MD  [facility] 07/17/2018, 11:24 AM Pager [phone]  If 7PM-7AM, please contact night-coverage [email or web address] Password [email or web address]

## 2018-07-17 NOTE — Progress Notes (Addendum)
Contacted pt's pharmacy and her copay is $86 for Lovenox, they have 20 in stock and will order for Monday pick up. Jonnie Finner RN CCM Case Mgmt phone [phone]

## 2018-07-17 NOTE — Progress Notes (Signed)
SATURATION QUALIFICATIONS: (This note is used to comply with regulatory documentation for home oxygen)  Patient Saturations on Room Air at Rest = 95%  Patient Saturations on Room Air while Ambulating = 90-95%  Patient Saturations on 0 Liters of oxygen while Ambulating = 90-95%  Please briefly explain why patient needs home oxygen:

## 2018-07-17 NOTE — Progress Notes (Signed)
PAtient discharged home with husband, discharge instructions given and explained to patient/husband and they verbalized understanding, patient denies any pain/distress. SKin intact. No wound/pressure injury noted. Patient educated and  demonstrated injecting Lovenox inject and comfortable giving it. Accompanied home by husband, transported to the car by staff.

## 2018-07-19 ENCOUNTER — Telehealth: Payer: Self-pay

## 2018-07-19 NOTE — Telephone Encounter (Signed)
Her husband called and left a message to call. They called the after hours number last night.  Called back she had chills/ shaking last night. No fever. Chills and shaking went away last night. Vomited x 1 last night. Complaining of abdominal pain. Small bowel movement this morning. Not taking anything for pain.

## 2018-07-19 NOTE — Telephone Encounter (Signed)
Called back. Above message given to Dr. Alvy Bimler. Per Dr. Alvy Bimler, instructed to take antiemetic's and pain medication as needed. To continue taking medications for constipation. To continue eating and drinking to stay hydrated. Offered appt with Sandi Mealy, PA, she declined appt with PA and she will see Dr. Alvy Bimler tomorrow. She verbalized understanding. Instructed to call the office if needed.

## 2018-07-20 ENCOUNTER — Inpatient Hospital Stay: Payer: Medicare Other | Admitting: Hematology and Oncology

## 2018-07-20 ENCOUNTER — Inpatient Hospital Stay (HOSPITAL_COMMUNITY)
Admission: EM | Admit: 2018-07-20 | Discharge: 2018-07-29 | DRG: 853 | Disposition: A | Discharge status: Skilled Nursing Facility | Payer: Medicare Other | Attending: Internal Medicine | Admitting: Internal Medicine

## 2018-07-20 ENCOUNTER — Other Ambulatory Visit: Payer: Self-pay

## 2018-07-20 ENCOUNTER — Emergency Department (HOSPITAL_COMMUNITY): Payer: Medicare Other

## 2018-07-20 ENCOUNTER — Encounter (HOSPITAL_COMMUNITY): Payer: Self-pay

## 2018-07-20 DIAGNOSIS — A4151 Sepsis due to Escherichia coli [E. coli]: Principal | ICD-10-CM | POA: Diagnosis present

## 2018-07-20 DIAGNOSIS — E039 Hypothyroidism, unspecified: Secondary | ICD-10-CM | POA: Diagnosis present

## 2018-07-20 DIAGNOSIS — N183 Chronic kidney disease, stage 3 (moderate): Secondary | ICD-10-CM | POA: Diagnosis present

## 2018-07-20 DIAGNOSIS — Z90710 Acquired absence of both cervix and uterus: Secondary | ICD-10-CM

## 2018-07-20 DIAGNOSIS — K5909 Other constipation: Secondary | ICD-10-CM | POA: Diagnosis present

## 2018-07-20 DIAGNOSIS — Z882 Allergy status to sulfonamides status: Secondary | ICD-10-CM

## 2018-07-20 DIAGNOSIS — Z9841 Cataract extraction status, right eye: Secondary | ICD-10-CM

## 2018-07-20 DIAGNOSIS — Z923 Personal history of irradiation: Secondary | ICD-10-CM

## 2018-07-20 DIAGNOSIS — E871 Hypo-osmolality and hyponatremia: Secondary | ICD-10-CM | POA: Diagnosis present

## 2018-07-20 DIAGNOSIS — T451X5A Adverse effect of antineoplastic and immunosuppressive drugs, initial encounter: Secondary | ICD-10-CM | POA: Diagnosis present

## 2018-07-20 DIAGNOSIS — D61810 Antineoplastic chemotherapy induced pancytopenia: Secondary | ICD-10-CM | POA: Diagnosis present

## 2018-07-20 DIAGNOSIS — D649 Anemia, unspecified: Secondary | ICD-10-CM | POA: Diagnosis present

## 2018-07-20 DIAGNOSIS — N136 Pyonephrosis: Secondary | ICD-10-CM | POA: Diagnosis present

## 2018-07-20 DIAGNOSIS — Z79890 Hormone replacement therapy (postmenopausal): Secondary | ICD-10-CM

## 2018-07-20 DIAGNOSIS — C787 Secondary malignant neoplasm of liver and intrahepatic bile duct: Secondary | ICD-10-CM | POA: Diagnosis present

## 2018-07-20 DIAGNOSIS — Z881 Allergy status to other antibiotic agents status: Secondary | ICD-10-CM

## 2018-07-20 DIAGNOSIS — E860 Dehydration: Secondary | ICD-10-CM | POA: Diagnosis present

## 2018-07-20 DIAGNOSIS — Z8249 Family history of ischemic heart disease and other diseases of the circulatory system: Secondary | ICD-10-CM

## 2018-07-20 DIAGNOSIS — M21372 Foot drop, left foot: Secondary | ICD-10-CM | POA: Diagnosis present

## 2018-07-20 DIAGNOSIS — Z9049 Acquired absence of other specified parts of digestive tract: Secondary | ICD-10-CM

## 2018-07-20 DIAGNOSIS — N179 Acute kidney failure, unspecified: Secondary | ICD-10-CM

## 2018-07-20 DIAGNOSIS — Z9221 Personal history of antineoplastic chemotherapy: Secondary | ICD-10-CM

## 2018-07-20 DIAGNOSIS — C541 Malignant neoplasm of endometrium: Secondary | ICD-10-CM | POA: Diagnosis present

## 2018-07-20 DIAGNOSIS — C7800 Secondary malignant neoplasm of unspecified lung: Secondary | ICD-10-CM | POA: Diagnosis present

## 2018-07-20 DIAGNOSIS — R112 Nausea with vomiting, unspecified: Secondary | ICD-10-CM | POA: Diagnosis present

## 2018-07-20 DIAGNOSIS — D509 Iron deficiency anemia, unspecified: Secondary | ICD-10-CM

## 2018-07-20 DIAGNOSIS — K219 Gastro-esophageal reflux disease without esophagitis: Secondary | ICD-10-CM | POA: Diagnosis present

## 2018-07-20 DIAGNOSIS — I82403 Acute embolism and thrombosis of unspecified deep veins of lower extremity, bilateral: Secondary | ICD-10-CM | POA: Diagnosis present

## 2018-07-20 DIAGNOSIS — C786 Secondary malignant neoplasm of retroperitoneum and peritoneum: Secondary | ICD-10-CM | POA: Diagnosis present

## 2018-07-20 DIAGNOSIS — K625 Hemorrhage of anus and rectum: Secondary | ICD-10-CM | POA: Diagnosis not present

## 2018-07-20 DIAGNOSIS — I129 Hypertensive chronic kidney disease with stage 1 through stage 4 chronic kidney disease, or unspecified chronic kidney disease: Secondary | ICD-10-CM | POA: Diagnosis present

## 2018-07-20 DIAGNOSIS — E440 Moderate protein-calorie malnutrition: Secondary | ICD-10-CM | POA: Diagnosis present

## 2018-07-20 DIAGNOSIS — I2699 Other pulmonary embolism without acute cor pulmonale: Secondary | ICD-10-CM | POA: Diagnosis present

## 2018-07-20 DIAGNOSIS — Z79899 Other long term (current) drug therapy: Secondary | ICD-10-CM

## 2018-07-20 DIAGNOSIS — I700 Atherosclerosis of aorta: Secondary | ICD-10-CM | POA: Diagnosis present

## 2018-07-20 DIAGNOSIS — Z9842 Cataract extraction status, left eye: Secondary | ICD-10-CM

## 2018-07-20 DIAGNOSIS — Z87891 Personal history of nicotine dependence: Secondary | ICD-10-CM

## 2018-07-20 DIAGNOSIS — Z9851 Tubal ligation status: Secondary | ICD-10-CM

## 2018-07-20 DIAGNOSIS — Z96652 Presence of left artificial knee joint: Secondary | ICD-10-CM | POA: Diagnosis present

## 2018-07-20 DIAGNOSIS — Z9079 Acquired absence of other genital organ(s): Secondary | ICD-10-CM

## 2018-07-20 LAB — URINALYSIS, ROUTINE W REFLEX MICROSCOPIC
Bilirubin Urine: NEGATIVE
Glucose, UA: NEGATIVE mg/dL
Ketones, ur: NEGATIVE mg/dL
Nitrite: NEGATIVE
Protein, ur: 100 mg/dL — AB
Specific Gravity, Urine: 1.012 (ref 1.005–1.030)
WBC, UA: >50 WBC/hpf — ABNORMAL HIGH (ref 0–5)
pH: 6 (ref 5.0–8.0)

## 2018-07-20 LAB — CBC WITH DIFFERENTIAL/PLATELET
Abs Immature Granulocytes: 0.1 10*3/uL — ABNORMAL HIGH (ref 0.00–0.07)
Basophils Absolute: 0 10*3/uL (ref 0.0–0.1)
Basophils Relative: 0 %
Eosinophils Absolute: 0 10*3/uL (ref 0.0–0.5)
Eosinophils Relative: 0 %
HCT: 27 % — ABNORMAL LOW (ref 36.0–46.0)
Hemoglobin: 8.7 g/dL — ABNORMAL LOW (ref 12.0–15.0)
Immature Granulocytes: 1 %
Lymphocytes Relative: 2 %
Lymphs Abs: 0.2 10*3/uL — ABNORMAL LOW (ref 0.7–4.0)
MCH: 26.6 pg (ref 26.0–34.0)
MCHC: 32.2 g/dL (ref 30.0–36.0)
MCV: 82.6 fL (ref 80.0–100.0)
Monocytes Absolute: 0.2 10*3/uL (ref 0.1–1.0)
Monocytes Relative: 2 %
Neutro Abs: 7.7 10*3/uL (ref 1.7–7.7)
Neutrophils Relative %: 95 %
Platelets: 82 10*3/uL — ABNORMAL LOW (ref 150–400)
RBC: 3.27 MIL/uL — ABNORMAL LOW (ref 3.87–5.11)
RDW: 16.7 % — ABNORMAL HIGH (ref 11.5–15.5)
WBC: 8.2 10*3/uL (ref 4.0–10.5)
nRBC: 0 % (ref 0.0–0.2)

## 2018-07-20 LAB — COMPREHENSIVE METABOLIC PANEL
ALT: 13 U/L (ref 0–44)
AST: 27 U/L (ref 15–41)
Albumin: 2.8 g/dL — ABNORMAL LOW (ref 3.5–5.0)
Alkaline Phosphatase: 70 U/L (ref 38–126)
Anion gap: 11 (ref 5–15)
BUN: 41 mg/dL — ABNORMAL HIGH (ref 8–23)
CO2: 24 mmol/L (ref 22–32)
Calcium: 7.2 mg/dL — ABNORMAL LOW (ref 8.9–10.3)
Chloride: 96 mmol/L — ABNORMAL LOW (ref 98–111)
Creatinine, Ser: 2.66 mg/dL — ABNORMAL HIGH (ref 0.44–1.00)
GFR calc Af Amer: 18 mL/min — ABNORMAL LOW (ref >60)
GFR calc non Af Amer: 16 mL/min — ABNORMAL LOW (ref >60)
Glucose, Bld: 122 mg/dL — ABNORMAL HIGH (ref 70–99)
Potassium: 3.8 mmol/L (ref 3.5–5.1)
Sodium: 131 mmol/L — ABNORMAL LOW (ref 135–145)
Total Bilirubin: 0.7 mg/dL (ref 0.3–1.2)
Total Protein: 6.1 g/dL — ABNORMAL LOW (ref 6.5–8.1)

## 2018-07-20 LAB — BRAIN NATRIURETIC PEPTIDE: B Natriuretic Peptide: 356.8 pg/mL — ABNORMAL HIGH (ref 0.0–100.0)

## 2018-07-20 MED ORDER — DRONABINOL 2.5 MG PO CAPS
2.5000 mg | ORAL_CAPSULE | Freq: Two times a day (BID) | ORAL | Status: DC
  Administered 2018-07-21 – 2018-07-29 (×17): 2.5 mg via ORAL
  Filled 2018-07-20 (×18): qty 1

## 2018-07-20 MED ORDER — ONDANSETRON HCL 4 MG PO TABS: 8.0000 mg | ORAL_TABLET | Freq: Three times a day (TID) | ORAL | Status: DC | PRN

## 2018-07-20 MED ORDER — METOCLOPRAMIDE HCL 10 MG PO TABS
10.0000 mg | ORAL_TABLET | Freq: Three times a day (TID) | ORAL | Status: DC
  Administered 2018-07-20 – 2018-07-29 (×27): 10 mg via ORAL
  Filled 2018-07-20 (×27): qty 1

## 2018-07-20 MED ORDER — ACETAMINOPHEN 325 MG PO TABS
650.0000 mg | ORAL_TABLET | Freq: Four times a day (QID) | ORAL | Status: DC | PRN
  Administered 2018-07-20: 650 mg via ORAL
  Filled 2018-07-20: qty 2

## 2018-07-20 MED ORDER — HEPARIN (PORCINE) IN NACL 100-0.45 UNIT/ML-% IJ SOLN
1250.0000 [IU]/h | INTRAMUSCULAR | Status: DC
  Administered 2018-07-21 – 2018-07-22 (×2): 1000 [IU]/h via INTRAVENOUS
  Administered 2018-07-23: 1250 [IU]/h via INTRAVENOUS
  Filled 2018-07-20 (×3): qty 250

## 2018-07-20 MED ORDER — TRIAMCINOLONE ACETONIDE 55 MCG/ACT NA AERO
1.0000 | INHALATION_SPRAY | Freq: Every day | NASAL | Status: DC
  Administered 2018-07-22 – 2018-07-29 (×8): 1 via NASAL
  Filled 2018-07-20: qty 10.8

## 2018-07-20 MED ORDER — MELATONIN 5 MG PO TABS
5.0000 mg | ORAL_TABLET | Freq: Every day | ORAL | Status: DC
  Administered 2018-07-20 – 2018-07-28 (×9): 5 mg via ORAL
  Filled 2018-07-20 (×9): qty 1

## 2018-07-20 MED ORDER — DEXAMETHASONE 4 MG PO TABS
4.0000 mg | ORAL_TABLET | Freq: Every day | ORAL | Status: DC
  Administered 2018-07-20 – 2018-07-26 (×7): 4 mg via ORAL
  Filled 2018-07-20 (×8): qty 1

## 2018-07-20 MED ORDER — POLYETHYL GLYCOL-PROPYL GLYCOL 0.4-0.3 % OP SOLN: 1.0000 [drp] | Freq: Every day | OPHTHALMIC | Status: DC | PRN

## 2018-07-20 MED ORDER — POLYVINYL ALCOHOL 1.4 % OP SOLN
1.0000 [drp] | Freq: Every day | OPHTHALMIC | Status: DC | PRN
  Administered 2018-07-25: 1 [drp] via OPHTHALMIC
  Filled 2018-07-20: qty 15

## 2018-07-20 MED ORDER — LEVOTHYROXINE SODIUM 88 MCG PO TABS
88.0000 ug | ORAL_TABLET | Freq: Every day | ORAL | Status: DC
  Administered 2018-07-21 – 2018-07-29 (×9): 88 ug via ORAL
  Filled 2018-07-20 (×9): qty 1

## 2018-07-20 MED ORDER — HYDROMORPHONE HCL 2 MG PO TABS
2.0000 mg | ORAL_TABLET | ORAL | Status: DC | PRN
  Administered 2018-07-23: 2 mg via ORAL
  Filled 2018-07-20: qty 1

## 2018-07-20 MED ORDER — ACETAMINOPHEN 650 MG RE SUPP: 650.0000 mg | Freq: Four times a day (QID) | RECTAL | Status: DC | PRN

## 2018-07-20 MED ORDER — ONDANSETRON HCL 4 MG PO TABS
8.0000 mg | ORAL_TABLET | Freq: Four times a day (QID) | ORAL | Status: DC | PRN
  Administered 2018-07-23: 8 mg via ORAL
  Filled 2018-07-20: qty 2

## 2018-07-20 MED ORDER — ONDANSETRON HCL 4 MG/2ML IJ SOLN
4.0000 mg | Freq: Four times a day (QID) | INTRAMUSCULAR | Status: DC | PRN
  Administered 2018-07-20 – 2018-07-23 (×2): 4 mg via INTRAVENOUS
  Filled 2018-07-20 (×2): qty 2

## 2018-07-20 MED ORDER — SODIUM CHLORIDE 0.9 % IV SOLN
INTRAVENOUS | Status: DC
  Administered 2018-07-20 – 2018-07-26 (×7): via INTRAVENOUS

## 2018-07-20 MED ORDER — ONDANSETRON HCL 4 MG PO TABS
4.0000 mg | ORAL_TABLET | Freq: Four times a day (QID) | ORAL | Status: DC | PRN
  Administered 2018-07-20: 4 mg via ORAL
  Filled 2018-07-20: qty 1

## 2018-07-20 MED ORDER — ONDANSETRON HCL 4 MG/2ML IJ SOLN: 4.0000 mg | Freq: Four times a day (QID) | INTRAMUSCULAR | Status: DC | PRN

## 2018-07-20 MED ORDER — LACTATED RINGERS IV BOLUS
500.0000 mL | Freq: Once | INTRAVENOUS | Status: AC
  Administered 2018-07-20: 500 mL via INTRAVENOUS

## 2018-07-20 MED ORDER — DULOXETINE HCL 30 MG PO CPEP: 30.0000 mg | ORAL_CAPSULE | Freq: Every day | ORAL | Status: DC

## 2018-07-20 MED ORDER — ONDANSETRON HCL 4 MG/2ML IJ SOLN
4.0000 mg | Freq: Once | INTRAMUSCULAR | Status: AC
  Administered 2018-07-20: 4 mg via INTRAVENOUS
  Filled 2018-07-20: qty 2

## 2018-07-20 NOTE — ED Notes (Signed)
No urine noted in pure wick at this time  

## 2018-07-20 NOTE — ED Notes (Signed)
Pure wick in place for urine specimen

## 2018-07-20 NOTE — ED Notes (Signed)
ED TO INPATIENT HANDOFF REPORT  Name/Age/Gender Kayla Guerrero 83 y.o. female  Code Status Code Status History    Date Active Date Inactive Code Status Order ID Comments User Context   07/14/2018 2328 07/17/2018 1832 Full Code [id number]  Etta Quill, DO ED   04/12/2018 1521 04/14/2018 2044 Full Code [id number]  Gaynelle Arabian, MD Inpatient   02/17/2017 2154 02/20/2017 1834 Full Code [id number]  Reubin Milan, MD Inpatient   05/06/2016 1212 05/07/2016 1849 Full Code [id number]  Lahoma Crocker, MD Inpatient    Advance Directive Documentation     Most Recent Value  Type of Advance Directive  Healthcare Power of Attorney, Living will  Pre-existing out of facility DNR order (yellow form or pink MOST form)  -  "MOST" Form in Place?  -      Home/SNF/Other Home  Chief Complaint sla  Level of Care/Admitting Diagnosis ED Disposition    ED Disposition Condition Tony: [facility] [100102]  Level of Care: Telemetry [5]  Admit to tele based on following criteria: Other see comments  Comments: recent pe  Diagnosis: AKI (acute kidney injury) [facility]) [109323]  Admitting Physician: Caren Griffins [phone]  Attending Physician: Caren Griffins [[id number]]  PT Class (Do Not Modify): Observation [104]  PT Acc Code (Do Not Modify): Observation [10022]       Medical History Past Medical History:  Diagnosis Date  . Aortic atherosclerosis ([place])   . Arthritis   . Arthritis of knee, left   . Borderline osteopenia DEXA 2006 and 2012  . Cancer [facility]    endometrial  . Cataract   . Cholesterol serum elevated   . Colon polyps   . DDD (degenerative disc disease), lumbar   . DDD (degenerative disc disease), lumbar   . Diverticulitis of sigmoid colon    Moderate  . Facet arthropathy, lumbar   . FHx: colon cancer   . GERD (gastroesophageal reflux disease)   . History of hiatal hernia    Moderate  . History of radiation therapy 08/06/16 -  09/03/16   vaginal cuff treated to 30 Gy in 5 fractions  . Hypothyroidism   . Kidney lesion, native, right   . Left foot drop   . Leukopenia   . Neuropathy    chemo indiced ; in right hand and bilateral feet   . Obesity   . Pelvic abscess in female 01/2017   Right  . Spondylisthesis   . Umbilical hernia     Allergies Allergies  Allergen Reactions  . Levofloxacin Other (See Comments)    Unknown  . Promethazine Hcl Other (See Comments)    fainting  . Sulfa Antibiotics Swelling  . Sulfa Drugs Cross Reactors Swelling  . Sulfur Swelling    IV Location/Drains/Wounds Patient Lines/Drains/Airways Status   Active Line/Drains/Airways    Name:   Placement date:   Placement time:   Site:   Days:   Implanted Port 06/25/18 Right Chest   06/25/18    1037    Chest   25   Peripheral IV 07/20/18 Left Antecubital   07/20/18    0836    Antecubital   less than 1   External Urinary Catheter   07/15/18    1900    -   5   Incision (Closed) 04/12/18 Knee Left   04/12/18    1223     99          Labs/Imaging Results  for orders placed or performed during the hospital encounter of 07/20/18 (from the past 48 hour(s))  CBC with Differential     Status: Abnormal   Collection Time: 07/20/18  9:15 AM  Result Value Ref Range   WBC 8.2 4.0 - 10.5 K/uL   RBC 3.27 (L) 3.87 - 5.11 MIL/uL   Hemoglobin 8.7 (L) 12.0 - 15.0 g/dL   HCT 27.0 (L) 36.0 - 46.0 %   MCV 82.6 80.0 - 100.0 fL   MCH 26.6 26.0 - 34.0 pg   MCHC 32.2 30.0 - 36.0 g/dL   RDW 16.7 (H) 11.5 - 15.5 %   Platelets 82 (L) 150 - 400 K/uL    Comment: REPEATED TO VERIFY PLATELET COUNT CONFIRMED BY SMEAR Immature Platelet Fraction may be clinically indicated, consider ordering this additional test [id number]    nRBC 0.0 0.0 - 0.2 %   Neutrophils Relative % 95 %   Neutro Abs 7.7 1.7 - 7.7 K/uL   Lymphocytes Relative 2 %   Lymphs Abs 0.2 (L) 0.7 - 4.0 K/uL   Monocytes Relative 2 %   Monocytes Absolute 0.2 0.1 - 1.0 K/uL   Eosinophils  Relative 0 %   Eosinophils Absolute 0.0 0.0 - 0.5 K/uL   Basophils Relative 0 %   Basophils Absolute 0.0 0.0 - 0.1 K/uL   WBC Morphology DOHLE BODIES     Comment: TOXIC GRANULATION   Immature Granulocytes 1 %   Abs Immature Granulocytes 0.10 (H) 0.00 - 0.07 K/uL   Dohle Bodies PRESENT     Comment: Performed at [facility], [place] [address] [place], [place] [address]  Comprehensive metabolic panel     Status: Abnormal   Collection Time: 07/20/18  9:15 AM  Result Value Ref Range   Sodium 131 (L) 135 - 145 mmol/L   Potassium 3.8 3.5 - 5.1 mmol/L   Chloride 96 (L) 98 - 111 mmol/L   CO2 24 22 - 32 mmol/L   Glucose, Bld 122 (H) 70 - 99 mg/dL   BUN 41 (H) 8 - 23 mg/dL   Creatinine, Ser 2.66 (H) 0.44 - 1.00 mg/dL   Calcium 7.2 (L) 8.9 - 10.3 mg/dL   Total Protein 6.1 (L) 6.5 - 8.1 g/dL   Albumin 2.8 (L) 3.5 - 5.0 g/dL   AST 27 15 - 41 U/L   ALT 13 0 - 44 U/L   Alkaline Phosphatase 70 38 - 126 U/L   Total Bilirubin 0.7 0.3 - 1.2 mg/dL   GFR calc non Af Amer 16 (L) >60 mL/min   GFR calc Af Amer 18 (L) >60 mL/min    Comment: (NOTE) The eGFR has been calculated using the CKD EPI equation. This calculation has not been validated in all clinical situations. eGFR's persistently <60 mL/min signify possible Chronic Kidney Disease.    Anion gap 11 5 - 15    Comment: Performed at [facility], [place] [address], [place], [place] [address]  Brain natriuretic peptide     Status: Abnormal   Collection Time: 07/20/18  9:15 AM  Result Value Ref Range   B Natriuretic Peptide 356.8 (H) 0.0 - 100.0 pg/mL    Comment: Performed at [facility], [place] [address] [place], [place] [address]   Dg Chest 2 View  Result Date: 07/20/2018 CLINICAL DATA:  History metastatic endometrial carcinoma. Weakness today. EXAM: CHEST - 2 VIEW COMPARISON:  CT chest and PA and lateral chest 07/14/2018. FINDINGS: Scattered pulmonary nodules and  a large mass in the right  lower lobe appear unchanged. No consolidative process, pneumothorax or effusion. Heart size is normal. Aortic atherosclerosis is noted. Port-A-Cath is in place. No acute or focal bony abnormality. IMPRESSION: No acute disease in a patient with endometrial carcinoma metastatic to the lungs. Atherosclerosis. Electronically Signed   By: Inge Rise M.D.   On: 07/20/2018 10:16   None  Pending Labs Unresulted Labs (From admission, onward)    Start     Ordered   07/21/18 1600  Heparin level (unfractionated)  Once-Timed,   R     07/20/18 1340   07/21/18 0500  CBC  Daily,   R    Comments:  While on heparin drip    07/20/18 1340   07/20/18 0903  Urinalysis, Routine w reflex microscopic  Once,   R     07/20/18 0761   Signed and Held  Comprehensive metabolic panel  Tomorrow morning,   R     Signed and Held   Signed and Held  CBC  Tomorrow morning,   R     Signed and Held          Vitals/Pain Today's Vitals   07/20/18 1100 07/20/18 1130 07/20/18 1152 07/20/18 1200  BP: (!) 116/58 (!) 108/54 (!) 108/54 (!) 113/57  Pulse:   90   Resp: 16 (!) 21 (!) 21 16  Temp:      TempSrc:      SpO2:   92% 91%  PainSc:        Isolation Precautions No active isolations  Medications Medications  heparin ADULT infusion 100 units/mL (25000 units/213m sodium chloride 0.45%) (has no administration in time range)  ondansetron (ZOFRAN) injection 4 mg (4 mg Intravenous Given 07/20/18 1019)  lactated ringers bolus 500 mL (0 mLs Intravenous Stopped 07/20/18 1055)  lactated ringers bolus 500 mL (0 mLs Intravenous Stopped 07/20/18 1344)    Mobility walks

## 2018-07-20 NOTE — ED Notes (Signed)
Bed: TG25 Expected date: 07/20/18 Expected time: 8:25 AM Means of arrival: Ambulance Comments: EMS-possible sepsis

## 2018-07-20 NOTE — ED Provider Notes (Signed)
[facility] DEPT Provider Note   CSN: [id number] Arrival date & time: 07/20/18  0825     History   Chief Complaint Chief Complaint  Patient presents with  . Weakness  . Nausea    HPI Kayla Guerrero is a 83 y.o. female.  HPI   83 year old female with generalized weakness.  She was recently admitted with pulmonary embolism and discharged on Lovenox.  She was discharged on Saturday.  She reports continued and progressive generalized weakness since that time.  She denies any more falls but has "slid to the ground" twice since she was discharged.  She denies any acute injuries as result of this.  She has had persistent nausea and anorexia.  No fevers.  No acute urinary complaints.  She does feel short of breath but does not feel that it has acutely changed.  Past Medical History:  Diagnosis Date  . Aortic atherosclerosis ([place])   . Arthritis   . Arthritis of knee, left   . Borderline osteopenia DEXA 2006 and 2012  . Cancer [facility])    endometrial  . Cataract   . Cholesterol serum elevated   . Colon polyps   . DDD (degenerative disc disease), lumbar   . DDD (degenerative disc disease), lumbar   . Diverticulitis of sigmoid colon    Moderate  . Facet arthropathy, lumbar   . FHx: colon cancer   . GERD (gastroesophageal reflux disease)   . History of hiatal hernia    Moderate  . History of radiation therapy 08/06/16 - 09/03/16   vaginal cuff treated to 30 Gy in 5 fractions  . Hypothyroidism   . Kidney lesion, native, right   . Left foot drop   . Leukopenia   . Neuropathy    chemo indiced ; in right hand and bilateral feet   . Obesity   . Pelvic abscess in female 01/2017   Right  . Spondylisthesis   . Umbilical hernia     Patient Active Problem List   Diagnosis Date Noted  . Leg DVT (deep venous thromboembolism), acute, bilateral ([place]) 07/16/2018  . Hypertension 07/16/2018  . Pulmonary embolism with acute cor pulmonale ([place]) 07/14/2018    . B12 deficiency 07/12/2018  . Sinus congestion 07/02/2018  . Nausea without vomiting 07/02/2018  . Congestion of nasal sinus 07/02/2018  . Metastasis to lung ([place]) 06/14/2018  . Metastasis to liver ([place]) 06/14/2018  . Peritoneal metastases ([place]) 06/14/2018  . Cancer associated pain 06/14/2018  . Goals of care, counseling/discussion 06/14/2018  . Hydronephrosis of right kidney 06/14/2018  . Preventive measure 06/14/2018  . OA (osteoarthritis) of knee 04/12/2018  . Leukopenia 03/04/2018  . Sepsis ([place]) 02/17/2017  . Anemia 02/17/2017  . Hyponatremia 02/17/2017  . Hypothyroidism 02/17/2017  . Kidney lesion, native, right 01/08/2017  . Left foot drop 01/08/2017  . Leukopenia due to antineoplastic chemotherapy ([place]) 09/29/2016  . Pain in both lower extremities 09/29/2016  . Pelvic fluid collection 09/29/2016  . Port-A-Cath in place 08/27/2016  . Syncope and collapse 08/10/2016  . Sympathotonic orthostatic hypotension 08/02/2016  . Chemotherapy-induced peripheral neuropathy ([place]) 07/12/2016  . Chemotherapy induced nausea and vomiting 07/12/2016  . Chemotherapy-induced neuropathy ([place]) 07/02/2016  . Chemotherapy induced neutropenia ([place]) 06/20/2016  . Poor venous access 06/20/2016  . Osteoarthritis of multiple joints 06/06/2016  . Neuropathy 06/06/2016  . Acute postoperative pain of left groin 05/16/2016  . Malignant neoplasm of endometrium ([place]) 04/23/2016  . GERD (gastroesophageal reflux disease) 04/06/2014  . Arthritis of  knee 10/06/2013  . Macular degeneration 12/19/2012  . Left knee pain 09/15/2012  . Osteopenia 09/15/2012  . Pure hypercholesterolemia 06/11/2011  . Thyroid activity decreased 06/11/2011    Past Surgical History:  Procedure Laterality Date  . CATARACT EXTRACTION, BILATERAL    . CHOLECYSTECTOMY    . COLONOSCOPY  2010, 04/2013   Buccini  . IR GENERIC HISTORICAL  08/11/2016   IR FLUORO GUIDE PORT INSERTION RIGHT 08/11/2016 Greggory Keen, MD [facility]INTERV RAD   . IR GENERIC HISTORICAL  08/11/2016   IR US GUIDE VASC ACCESS RIGHT 08/11/2016 Greggory Keen, MD [facility]INTER[facility]  . IR IMAGING GUIDED PORT INSERTION  06/25/2018  . IR RADIOLOGIST EVAL & MGMT  02/25/2017  . IR RADIOLOGIST EVAL & MGMT  03/11/2017  . IR REMOVAL TUN ACCESS W/ PORT W/O FL MOD SED  03/23/2018  . PTERYGIUM EXCISION    . ROBOTIC ASSISTED TOTAL HYSTERECTOMY WITH BILATERAL SALPINGO OOPHERECTOMY N/A 05/06/2016   Procedure: XI ROBOTIC ASSISTED  LAPAROSCOPIC TOTAL HYSTERECTOMY WITH BILATERAL SALPINGO OOPHORECTOMY;  Surgeon: Nancy Marus, MD;  Location: [facility] ORS;  Service: Gynecology;  Laterality: N/A;  . SENTINEL NODE BIOPSY N/A 05/06/2016   Procedure: SENTINEL NODE BIOPSY;  Surgeon: Nancy Marus, MD;  Location: [facility] ORS;  Service: Gynecology;  Laterality: N/A;  . TOTAL KNEE ARTHROPLASTY Left 04/12/2018   Procedure: LEFT TOTAL KNEE ARTHROPLASTY;  Surgeon: Gaynelle Arabian, MD;  Location: [facility] ORS;  Service: Orthopedics;  Laterality: Left;  . TUBAL LIGATION       OB History    Gravida  3   Para  2   Term      Preterm      AB  1   Living  1     SAB  1   TAB      Ectopic      Multiple      Live Births               Home Medications    Prior to Admission medications   Medication Sig Start Date End Date Taking? Authorizing Provider  amLODipine (NORVASC) 10 MG tablet Take 1 tablet (10 mg total) by mouth daily. 07/18/18  Yes Shelly Coss, MD  dexamethasone (DECADRON) 4 MG tablet Take 1 tablet (4 mg total) by mouth daily. Patient taking differently: Take 4 mg by mouth daily. Pt takes each morning 07/12/18  Yes Gorsuch, Ni, MD  dronabinol (MARINOL) 2.5 MG capsule Take 1 capsule (2.5 mg total) by mouth 2 (two) times daily before lunch and supper. 07/12/18  Yes Gorsuch, Ni, MD  DULoxetine (CYMBALTA) 30 MG capsule Take 30 mg by mouth at bedtime. 12/30/17  Yes [provider]  enoxaparin (LOVENOX) 80 MG/0.8ML injection Inject 0.8 mLs (80 mg total) into the skin every 12  (twelve) hours. 07/17/18 08/16/18 Yes Shelly Coss, MD  HYDROmorphone (DILAUDID) 2 MG tablet Take 1 tablet (2 mg total) by mouth every 4 (four) hours as needed for severe pain. 06/14/18  Yes [name], MD  levothyroxine (SYNTHROID, LEVOTHROID) 88 MCG tablet TAKE ONE TABLET BY MOUTH ONCE DAILY BEFORE  BREAKFAST. 07/12/18  Yes Rita Ohara, MD  lidocaine-prilocaine (EMLA) cream Apply 1 application topically as needed. Patient taking differently: Apply 1 application topically as needed (port access).  06/21/18  Yes Gorsuch, Ni, MD  Melatonin 5 MG CAPS Take 5 mg by mouth at bedtime.    Yes [provider]  metoCLOPramide (REGLAN) 10 MG tablet Take 1 tablet (10 mg total) by mouth 3 (three) times daily  before meals. 07/12/18  Yes Gorsuch, Ni, MD  ondansetron (ZOFRAN) 8 MG tablet Take 1 tablet (8 mg total) by mouth every 8 (eight) hours as needed for nausea. 06/14/18  Yes Gorsuch, Ernst Spell, MD  Polyethyl Glycol-Propyl Glycol (SYSTANE ULTRA) 0.4-0.3 % SOLN Place 1 drop into both eyes daily as needed (for dry eyes).   Yes [provider]  polyethylene glycol (MIRALAX / GLYCOLAX) packet Take 17 g by mouth daily.    Yes [provider]  triamcinolone (NASACORT) 55 MCG/ACT AERO nasal inhaler Place 1 spray into the nose 2 (two) times daily. 07/02/18  Yes Gorsuch, Ni, MD  vitamin B-12 (CYANOCOBALAMIN) 1000 MCG tablet Take 1,000 mcg by mouth daily.   Yes [provider]    Family History Family History  Problem Relation Age of Onset  . Cancer Mother 54       COLON   . Heart disease Mother   . Cancer Maternal Aunt   . Breast cancer Maternal Aunt   . Mental illness Maternal Uncle   . Cancer Maternal Grandmother        stomach? more likely uterine  . Macular degeneration Maternal Grandmother   . Diabetes Neg Hx     Social History Social History   Tobacco Use  . Smoking status: Former Smoker    Packs/day: 1.00    Types: Cigarettes    Last attempt to quit: 09/29/1980     Years since quitting: 37.8  . Smokeless tobacco: Never Used  Substance Use Topics  . Alcohol use: Yes    Alcohol/week: 0.0 standard drinks    Comment: 1 glass of wine 3-4 times/week; hard liquor (highball) or a beer once a week  . Drug use: No     Allergies   Levofloxacin; Promethazine hcl; Sulfa antibiotics; Sulfa drugs cross reactors; and Sulfur   Review of Systems Review of Systems  All systems reviewed and negative, other than as noted in HPI.  Physical Exam Updated Vital Signs BP (!) 108/54   Pulse 90   Temp 99.4 F (37.4 C) (Rectal)   Resp (!) 21   SpO2 92%   Physical Exam  Constitutional: She appears well-developed.  Laying in bed.  Appears weak/tired but not acutely distressed.  HENT:  Head: Normocephalic.  Subacute appearing ecchymosis to right face.  No midline spinal tenderness.  Eyes: Conjunctivae are normal. Right eye exhibits no discharge. Left eye exhibits no discharge.  Neck: Neck supple.  Cardiovascular: Regular rhythm and normal heart sounds. Exam reveals no gallop and no friction rub.  No murmur heard. Mild tachycardia.  Port right chest.  Pulmonary/Chest: Effort normal and breath sounds normal. No respiratory distress.  Abdominal: Soft. She exhibits no distension. There is no tenderness.  Musculoskeletal: She exhibits no edema or tenderness.  Neurological: She is alert.  Skin: Skin is warm and dry.  Psychiatric: She has a normal mood and affect. Her behavior is normal. Thought content normal.  Nursing note and vitals reviewed.    ED Treatments / Results  Labs (all labs ordered are listed, but only abnormal results are displayed) Labs Reviewed  CBC WITH DIFFERENTIAL/PLATELET - Abnormal; Notable for the following components:      Result Value   RBC 3.27 (*)    Hemoglobin 8.7 (*)    HCT 27.0 (*)    RDW 16.7 (*)    Platelets 82 (*)    Lymphs Abs 0.2 (*)    Abs Immature Granulocytes 0.10 (*)    All other components  within normal limits   COMPREHENSIVE METABOLIC PANEL - Abnormal; Notable for the following components:   Sodium 131 (*)    Chloride 96 (*)    Glucose, Bld 122 (*)    BUN 41 (*)    Creatinine, Ser 2.66 (*)    Calcium 7.2 (*)    Total Protein 6.1 (*)    Albumin 2.8 (*)    GFR calc non Af Amer 16 (*)    GFR calc Af Amer 18 (*)    All other components within normal limits  BRAIN NATRIURETIC PEPTIDE - Abnormal; Notable for the following components:   B Natriuretic Peptide 356.8 (*)    All other components within normal limits  URINALYSIS, ROUTINE W REFLEX MICROSCOPIC    EKG None  Radiology Dg Chest 2 View  Result Date: 07/20/2018 CLINICAL DATA:  History metastatic endometrial carcinoma. Weakness today. EXAM: CHEST - 2 VIEW COMPARISON:  CT chest and PA and lateral chest 07/14/2018. FINDINGS: Scattered pulmonary nodules and a large mass in the right lower lobe appear unchanged. No consolidative process, pneumothorax or effusion. Heart size is normal. Aortic atherosclerosis is noted. Port-A-Cath is in place. No acute or focal bony abnormality. IMPRESSION: No acute disease in a patient with endometrial carcinoma metastatic to the lungs. Atherosclerosis. Electronically Signed   By: Inge Rise M.D.   On: 07/20/2018 10:16    Procedures Procedures (including critical care time)  Medications Ordered in ED Medications  lactated ringers bolus 500 mL (has no administration in time range)  ondansetron (ZOFRAN) injection 4 mg (4 mg Intravenous Given 07/20/18 1019)  lactated ringers bolus 500 mL (0 mLs Intravenous Stopped 07/20/18 1055)     Initial Impression / Assessment and Plan / ED Course  I have reviewed the triage vital signs and the nursing notes.  Pertinent labs & imaging results that were available during my care of the patient were reviewed by me and considered in my medical decision making (see chart for details).     83yF with generalized weakness. AKi/dehydration. Also likely underlying  chronic medical problems, chemo, recent diagnosis of PE and deconditioning contributing. IVf. Admit.   Final Clinical Impressions(s) / ED Diagnoses   Final diagnoses:  AKI (acute kidney injury) ([place])  Malignant neoplasm of endometrium [facility])    ED Discharge Orders    None       Virgel Manifold, MD 07/21/18 1427

## 2018-07-20 NOTE — H&P (Addendum)
History and Physical    Kayla Guerrero HKV:[id number] DOB: 10-03-1934 DOA: 07/20/2018  I have briefly reviewed the patient's prior medical records in [place]  PCP: Rita Ohara, MD  Patient coming from: home  Chief Complaint: Nausea vomiting  HPI: Kayla Guerrero is a 83 y.o. female with medical history significant of endometrial cancer currently undergoing chemotherapy status post cycle #2 about a week ago, hypothyroidism, recently diagnosed with submassive PE and bilateral DVT for which she was hospitalized and discharged 2 days ago comes back to the hospital with chief complaint of significant generalized weakness.  Patient went home 2 days ago, felt initially well however in the last 24 hours she has been having intractable nausea vomiting, as well as generalized weakness and barely able to transfer herself from bed to the bedside commode.  She denies any fever or chills, denies any shortness of breath.  She denies any chest pains.  She has been having intermittent abdominal cramping which is somewhat chronic in nature, but denies any diarrhea.  She denies any lightheadedness or dizziness currently, no focal weakness  ED Course: In the emergency room she is afebrile, blood pressure is soft, she is satting in the mid 90s on 2 L nasal cannula.  Blood work revealed elevation in her creatinine to 2.6 from normal 1.1 couple of days ago.  She is also hyponatremic with a sodium of 131.  She has mild anemia with a hemoglobin 8.7 and thrombocytopenia with platelets of 82.  We were asked to admit for intractable nausea vomiting as well as acute kidney injury  Review of Systems: As per HPI otherwise 10 point review of systems negative.   Past Medical History:  Diagnosis Date  . Aortic atherosclerosis ([place])   . Arthritis   . Arthritis of knee, left   . Borderline osteopenia DEXA 2006 and 2012  . Cancer [facility])    endometrial  . Cataract   . Cholesterol serum elevated   . Colon polyps   .  DDD (degenerative disc disease), lumbar   . DDD (degenerative disc disease), lumbar   . Diverticulitis of sigmoid colon    Moderate  . Facet arthropathy, lumbar   . FHx: colon cancer   . GERD (gastroesophageal reflux disease)   . History of hiatal hernia    Moderate  . History of radiation therapy 08/06/16 - 09/03/16   vaginal cuff treated to 30 Gy in 5 fractions  . Hypothyroidism   . Kidney lesion, native, right   . Left foot drop   . Leukopenia   . Neuropathy    chemo indiced ; in right hand and bilateral feet   . Obesity   . Pelvic abscess in female 01/2017   Right  . Spondylisthesis   . Umbilical hernia     Past Surgical History:  Procedure Laterality Date  . CATARACT EXTRACTION, BILATERAL    . CHOLECYSTECTOMY    . COLONOSCOPY  2010, 04/2013   Buccini  . IR GENERIC HISTORICAL  08/11/2016   IR FLUORO GUIDE PORT INSERTION RIGHT 08/11/2016 Greggory Keen, MD [facility]INTER[facility]  . IR GENERIC HISTORICAL  08/11/2016   IR US GUIDE VASC ACCESS RIGHT 08/11/2016 Greggory Keen, MD [facility]  . IR IMAGING GUIDED PORT INSERTION  06/25/2018  . IR RADIOLOGIST EVAL & MGMT  02/25/2017  . IR RADIOLOGIST EVAL & MGMT  03/11/2017  . IR REMOVAL TUN ACCESS W/ PORT W/O FL MOD SED  03/23/2018  . PTERYGIUM EXCISION    .  ROBOTIC ASSISTED TOTAL HYSTERECTOMY WITH BILATERAL SALPINGO OOPHERECTOMY N/A 05/06/2016   Procedure: XI ROBOTIC ASSISTED  LAPAROSCOPIC TOTAL HYSTERECTOMY WITH BILATERAL SALPINGO OOPHORECTOMY;  Surgeon: Nancy Marus, MD;  Location: [facility];  Service: Gynecology;  Laterality: N/A;  . SENTINEL NODE BIOPSY N/A 05/06/2016   Procedure: SENTINEL NODE BIOPSY;  Surgeon: Nancy Marus, MD;  Location: [facility];  Service: Gynecology;  Laterality: N/A;  . TOTAL KNEE ARTHROPLASTY Left 04/12/2018   Procedure: LEFT TOTAL KNEE ARTHROPLASTY;  Surgeon: Gaynelle Arabian, MD;  Location: [facility];  Service: Orthopedics;  Laterality: Left;  . TUBAL LIGATION       reports that she quit smoking about 37 years ago. Her  smoking use included cigarettes. She smoked 1.00 pack per day. She has never used smokeless tobacco. She reports that she drinks alcohol. She reports that she does not use drugs.  Allergies  Allergen Reactions  . Levofloxacin Other (See Comments)    Unknown  . Promethazine Hcl Other (See Comments)    fainting  . Sulfa Antibiotics Swelling  . Sulfa Drugs Cross Reactors Swelling  . Sulfur Swelling    Family History  Problem Relation Age of Onset  . Cancer Mother 48       COLON   . Heart disease Mother   . Cancer Maternal Aunt   . Breast cancer Maternal Aunt   . Mental illness Maternal Uncle   . Cancer Maternal Grandmother        stomach? more likely uterine  . Macular degeneration Maternal Grandmother   . Diabetes Neg Hx     Prior to Admission medications   Medication Sig Start Date End Date Taking? Authorizing Provider  amLODipine (NORVASC) 10 MG tablet Take 1 tablet (10 mg total) by mouth daily. 07/18/18  Yes Shelly Coss, MD  dexamethasone (DECADRON) 4 MG tablet Take 1 tablet (4 mg total) by mouth daily. Patient taking differently: Take 4 mg by mouth daily. Pt takes each morning 07/12/18  Yes Gorsuch, Ni, MD  dronabinol (MARINOL) 2.5 MG capsule Take 1 capsule (2.5 mg total) by mouth 2 (two) times daily before lunch and supper. 07/12/18  Yes Gorsuch, Ni, MD  DULoxetine (CYMBALTA) 30 MG capsule Take 30 mg by mouth at bedtime. 12/30/17  Yes [provider]  enoxaparin (LOVENOX) 80 MG/0.8ML injection Inject 0.8 mLs (80 mg total) into the skin every 12 (twelve) hours. 07/17/18 08/16/18 Yes Shelly Coss, MD  HYDROmorphone (DILAUDID) 2 MG tablet Take 1 tablet (2 mg total) by mouth every 4 (four) hours as needed for severe pain. 06/14/18  Yes [name], MD  levothyroxine (SYNTHROID, LEVOTHROID) 88 MCG tablet TAKE ONE TABLET BY MOUTH ONCE DAILY BEFORE  BREAKFAST. 07/12/18  Yes Rita Ohara, MD  lidocaine-prilocaine (EMLA) cream Apply 1 application topically as  needed. Patient taking differently: Apply 1 application topically as needed (port access).  06/21/18  Yes Gorsuch, Ni, MD  Melatonin 5 MG CAPS Take 5 mg by mouth at bedtime.    Yes [provider]  metoCLOPramide (REGLAN) 10 MG tablet Take 1 tablet (10 mg total) by mouth 3 (three) times daily before meals. 07/12/18  Yes Gorsuch, Ni, MD  ondansetron (ZOFRAN) 8 MG tablet Take 1 tablet (8 mg total) by mouth every 8 (eight) hours as needed for nausea. 06/14/18  Yes Gorsuch, Ernst Spell, MD  Polyethyl Glycol-Propyl Glycol (SYSTANE ULTRA) 0.4-0.3 % SOLN Place 1 drop into both eyes daily as needed (for dry eyes).   Yes [provider]  polyethylene glycol (MIRALAX / GLYCOLAX)  packet Take 17 g by mouth daily.    Yes [provider]  triamcinolone (NASACORT) 55 MCG/ACT AERO nasal inhaler Place 1 spray into the nose 2 (two) times daily. 07/02/18  Yes Gorsuch, Ni, MD  vitamin B-12 (CYANOCOBALAMIN) 1000 MCG tablet Take 1,000 mcg by mouth daily.   Yes [provider]    Physical Exam: Vitals:   07/20/18 1100 07/20/18 1130 07/20/18 1152 07/20/18 1200  BP: (!) 116/58 (!) 108/54 (!) 108/54 (!) 113/57  Pulse:   90   Resp: 16 (!) 21 (!) 21 16  Temp:      TempSrc:      SpO2:   92% 91%      Constitutional: NAD, calm, comfortable Eyes: PERRL, lids and conjunctivae normal ENMT: Mucous membranes are dry Neck: normal, supple Respiratory: clear to auscultation bilaterally, no wheezing, no crackles. Normal respiratory effort. No accessory muscle use.  Cardiovascular: Regular rate and rhythm, no murmurs / rubs / gallops. No extremity edema.  Abdomen: no tenderness, no masses palpated. Bowel sounds positive.  Musculoskeletal: no clubbing / cyanosis Skin: no rashes Neurologic: CN 2-12 grossly intact. Strength 5/5 in all 4.  Psychiatric: Normal judgment and insight. Alert and oriented x 3. Normal mood.   Labs on Admission: I have personally reviewed following labs and imaging  studies  CBC: Recent Labs  Lab 07/15/18 0452 07/16/18 0500 07/16/18 0806 07/17/18 0419 07/20/18 0915  WBC 4.7 3.2* 3.9* 4.2 8.2  NEUTROABS  --   --   --   --  7.7  HGB 9.2* 7.1* 9.5* 9.4* 8.7*  HCT 29.4* 23.1* 30.2* 29.7* 27.0*  MCV 83.8 87.2 84.4 84.9 82.6  PLT 197 142* 190 191 82*   Basic Metabolic Panel: Recent Labs  Lab 07/14/18 1655 07/15/18 0452 07/17/18 0419 07/20/18 0915  NA 138 136 135 131*  K 4.3 3.7 3.6 3.8  CL 102 102 100 96*  CO2 25 25 28 24   GLUCOSE 119* 88 87 122*  BUN 29* 24* 22 41*  CREATININE 1.24* 1.03* 1.13* 2.66*  CALCIUM 8.7* 7.9* 8.2* 7.2*   GFR: Estimated Creatinine Clearance: 17.9 mL/min (A) (by C-G formula based on SCr of 2.66 mg/dL (H)). Liver Function Tests: Recent Labs  Lab 07/14/18 1655 07/20/18 0915  AST 32 27  ALT 17 13  ALKPHOS 58 70  BILITOT 0.5 0.7  PROT 5.9* 6.1*  ALBUMIN 3.1* 2.8*   Recent Labs  Lab 07/14/18 1655  LIPASE 35   No results for input(s): AMMONIA in the last 168 hours. Coagulation Profile: Recent Labs  Lab 07/14/18 2240  INR 1.06   Cardiac Enzymes: Recent Labs  Lab 07/14/18 1655 07/14/18 2005 07/14/18 2323 07/15/18 0452 07/15/18 1123  TROPONINI 0.72* 0.78* 0.77* 0.58* 0.39*   BNP (last 3 results) No results for input(s): PROBNP in the last 8760 hours. HbA1C: No results for input(s): HGBA1C in the last 72 hours. CBG: Recent Labs  Lab 07/14/18 1629  GLUCAP 115*   Lipid Profile: No results for input(s): CHOL, HDL, LDLCALC, TRIG, CHOLHDL, LDLDIRECT in the last 72 hours. Thyroid Function Tests: No results for input(s): TSH, T4TOTAL, FREET4, T3FREE, THYROIDAB in the last 72 hours. Anemia Panel: No results for input(s): VITAMINB12, FOLATE, FERRITIN, TIBC, IRON, RETICCTPCT in the last 72 hours. Urine analysis:    Component Value Date/Time   COLORURINE YELLOW 07/14/2018 2004   APPEARANCEUR CLEAR 07/14/2018 2004   LABSPEC 1.014 07/14/2018 2004   LABSPEC 1.030 06/10/2018 1157   PHURINE  6.0 07/14/2018 2004  GLUCOSEU NEGATIVE 07/14/2018 2004   HGBUR NEGATIVE 07/14/2018 2004   BILIRUBINUR NEGATIVE 07/14/2018 2004   BILIRUBINUR negative 06/10/2018 1157   BILIRUBINUR neg 02/16/2017 [place] 07/14/2018 2004   PROTEINUR NEGATIVE 07/14/2018 2004   UROBILINOGEN 0.2 02/16/2017 1259   NITRITE NEGATIVE 07/14/2018 2004   LEUKOCYTESUR NEGATIVE 07/14/2018 2004     Radiological Exams on Admission: Dg Chest 2 View  Result Date: 07/20/2018 CLINICAL DATA:  History metastatic endometrial carcinoma. Weakness today. EXAM: CHEST - 2 VIEW COMPARISON:  CT chest and PA and lateral chest 07/14/2018. FINDINGS: Scattered pulmonary nodules and a large mass in the right lower lobe appear unchanged. No consolidative process, pneumothorax or effusion. Heart size is normal. Aortic atherosclerosis is noted. Port-A-Cath is in place. No acute or focal bony abnormality. IMPRESSION: No acute disease in a patient with endometrial carcinoma metastatic to the lungs. Atherosclerosis. Electronically Signed   By: Inge Rise M.D.   On: 07/20/2018 10:16   Assessment/Plan Active Problems:   Chemotherapy induced nausea and vomiting   Anemia   Hyponatremia   Hypothyroidism   PE (pulmonary thromboembolism) ([facility])   Leg DVT (deep venous thromboembolism), acute, bilateral ([facility])   AKI (acute kidney injury) ([place])  Nausea/vomiting -Likely in the setting of recent chemotherapy, supportive management with IV fluids as well as antiemetics  Acute kidney injury -Likely prerenal in setting of poor p.o. intake as well as nausea vomiting.  IV fluids and repeat renal function in the morning  Anemia/thrombocytopenia -Likely in the setting of malignancy, closely monitor for bleeding, no evidence currently  Hyponatremia -Likely due to dehydration, repeat sodium after fluids  Recent PE/DVT -Discharged 2 days ago, she underwent an echo at that time that showed an EF of 66 5%.  She was placed on  Lovenox and sent home.  Due to AKI as well as thrombocytopenia, I would rather do heparin infusion for today.  Keep on telemetry today and consider DC 24 to 48 hours if no significant events  Metastatic endometrial cancer -Managed as an outpatient, will tag Dr Alvy Bimler to the chart  Hypertension -Soft blood pressure in the ED, hold Norvasc.  Hypothyroidism -Continue Synthroid   DVT prophylaxis: Heparin infusion Code Status: Full code Family Communication: Husband present at bedside Disposition Plan: Admit to telemetry Consults called: None   Admission status: Observation  At the point of initial evaluation, it is my clinical opinion that admission for OBSERVATION is reasonable and necessary because the patient's presenting complaints in the context of their chronic conditions represent sufficient risk of deterioration or significant morbidity to constitute reasonable grounds for close observation in the hospital setting, but that the patient may be medically stable for discharge from the hospital within 24 to 48 hours.   Marzetta Board, MD [facility] Pager [phone]  If 7PM-7AM, please contact night-coverage [email or web address] Password [email or web address]  07/20/2018, 12:56 PM

## 2018-07-20 NOTE — ED Triage Notes (Signed)
EMS reports from home, generalized weakness increasing past few days, nausea and vomiting. CA pt with last Chemo treatment a week ago.[name] Hospitalized for PE and DVT last week placed on Lovenox. Had morning dose.   BP 91/69 HR 106 RR 30 Sp02 92 4ltrs CBG 186  20ga LAC

## 2018-07-20 NOTE — Progress Notes (Signed)
ANTICOAGULATION CONSULT NOTE - Initial Consult  Pharmacy Consult for heparin Indication: B PE & DVT  Allergies  Allergen Reactions  . Levofloxacin Other (See Comments)    Unknown  . Promethazine Hcl Other (See Comments)    fainting  . Sulfa Antibiotics Swelling  . Sulfa Drugs Cross Reactors Swelling  . Sulfur Swelling    Patient Measurements:   Heparin Dosing Weight: 76.4  Vital Signs: Temp: 99.4 F (37.4 C) (10/22 0919) Temp Source: Rectal (10/22 0919) BP: 113/57 (10/22 1200) Pulse Rate: 90 (10/22 1152)  Labs: Recent Labs    07/20/18 0915  HGB 8.7*  HCT 27.0*  PLT 82*  CREATININE 2.66*    Estimated Creatinine Clearance: 17.9 mL/min (A) (by C-G formula based on SCr of 2.66 mg/dL (H)).   Medical History: Past Medical History:  Diagnosis Date  . Aortic atherosclerosis ([place])   . Arthritis   . Arthritis of knee, left   . Borderline osteopenia DEXA 2006 and 2012  . Cancer [facility])    endometrial  . Cataract   . Cholesterol serum elevated   . Colon polyps   . DDD (degenerative disc disease), lumbar   . DDD (degenerative disc disease), lumbar   . Diverticulitis of sigmoid colon    Moderate  . Facet arthropathy, lumbar   . FHx: colon cancer   . GERD (gastroesophageal reflux disease)   . History of hiatal hernia    Moderate  . History of radiation therapy 08/06/16 - 09/03/16   vaginal cuff treated to 30 Gy in 5 fractions  . Hypothyroidism   . Kidney lesion, native, right   . Left foot drop   . Leukopenia   . Neuropathy    chemo indiced ; in right hand and bilateral feet   . Obesity   . Pelvic abscess in female 01/2017   Right  . Spondylisthesis   . Umbilical hernia    Assessment: 40 yoF with PMH of metastatic endometrial cancer with bilateral PE and B DVT diagnosed on 07/14/18.  Pt in ED 10/22 with N/V and AKI.  Pharmacy consulted to dose heparin due to AKI and thrombocytopenia.  She was on LMWH 80 mg sq q12 hours with last dose this morning at 08 am. Her  SCr was 1.13 on 10/19 and 2.66 today with CrCl 18 ml/min.  The LMWH will be in her system at least 24 hours.  Her heparin levels were therapeutic 10/17 and 10/18 on heparin drip at 1000 units/hr.  Hg low at 8.7 (9.4 on 10/19) and PLTC low at 82.  No overt bleeding reported.   Goal of Therapy:  Heparin level 0.3-0.7 units/ml Monitor platelets by anticoagulation protocol: Yes   Plan:  Start heparin with no bolus 24 hours after her last LMWH 80 mg dose Start heparin 1000 units/hr 10/23 at 08 am Check 8 hr HL Daily HL and CBC while on heparin  Eudelia Bunch, Pharm.D [phone] 07/20/2018 1:38 PM

## 2018-07-21 ENCOUNTER — Observation Stay (HOSPITAL_COMMUNITY): Payer: Medicare Other

## 2018-07-21 DIAGNOSIS — I2699 Other pulmonary embolism without acute cor pulmonale: Secondary | ICD-10-CM

## 2018-07-21 DIAGNOSIS — C55 Malignant neoplasm of uterus, part unspecified: Secondary | ICD-10-CM

## 2018-07-21 DIAGNOSIS — E43 Unspecified severe protein-calorie malnutrition: Secondary | ICD-10-CM

## 2018-07-21 DIAGNOSIS — C799 Secondary malignant neoplasm of unspecified site: Secondary | ICD-10-CM

## 2018-07-21 DIAGNOSIS — R531 Weakness: Secondary | ICD-10-CM

## 2018-07-21 DIAGNOSIS — Z6829 Body mass index (BMI) 29.0-29.9, adult: Secondary | ICD-10-CM | POA: Diagnosis not present

## 2018-07-21 DIAGNOSIS — D61818 Other pancytopenia: Secondary | ICD-10-CM

## 2018-07-21 DIAGNOSIS — N179 Acute kidney failure, unspecified: Secondary | ICD-10-CM | POA: Diagnosis not present

## 2018-07-21 DIAGNOSIS — R5081 Fever presenting with conditions classified elsewhere: Secondary | ICD-10-CM

## 2018-07-21 LAB — COMPREHENSIVE METABOLIC PANEL
ALBUMIN: 2.4 g/dL — AB (ref 3.5–5.0)
ALT: 12 U/L (ref 0–44)
AST: 21 U/L (ref 15–41)
Alkaline Phosphatase: 62 U/L (ref 38–126)
Anion gap: 12 (ref 5–15)
BUN: 45 mg/dL — AB (ref 8–23)
CHLORIDE: 97 mmol/L — AB (ref 98–111)
CO2: 21 mmol/L — ABNORMAL LOW (ref 22–32)
CREATININE: 2.67 mg/dL — AB (ref 0.44–1.00)
Calcium: 6.7 mg/dL — ABNORMAL LOW (ref 8.9–10.3)
GFR calc Af Amer: 18 mL/min — ABNORMAL LOW (ref >60)
GFR, EST NON AFRICAN AMERICAN: 16 mL/min — AB (ref >60)
GLUCOSE: 113 mg/dL — AB (ref 70–99)
Potassium: 3.7 mmol/L (ref 3.5–5.1)
Sodium: 130 mmol/L — ABNORMAL LOW (ref 135–145)
Total Bilirubin: 0.8 mg/dL (ref 0.3–1.2)
Total Protein: 5.5 g/dL — ABNORMAL LOW (ref 6.5–8.1)

## 2018-07-21 LAB — CBC
HEMATOCRIT: 25.2 % — AB (ref 36.0–46.0)
HEMOGLOBIN: 8.1 g/dL — AB (ref 12.0–15.0)
MCH: 26.4 pg (ref 26.0–34.0)
MCHC: 32.1 g/dL (ref 30.0–36.0)
MCV: 82.1 fL (ref 80.0–100.0)
NRBC: 0 % (ref 0.0–0.2)
Platelets: 66 10*3/uL — ABNORMAL LOW (ref 150–400)
RBC: 3.07 MIL/uL — AB (ref 3.87–5.11)
RDW: 16.7 % — AB (ref 11.5–15.5)
WBC: 6.4 10*3/uL (ref 4.0–10.5)

## 2018-07-21 LAB — PROCALCITONIN: Procalcitonin: 15.85 ng/mL

## 2018-07-21 LAB — HEPARIN LEVEL (UNFRACTIONATED): Heparin Unfractionated: 0.62 IU/mL (ref 0.30–0.70)

## 2018-07-21 MED ORDER — SODIUM CHLORIDE 0.9% FLUSH
10.0000 mL | Freq: Two times a day (BID) | INTRAVENOUS | Status: DC
  Administered 2018-07-21 – 2018-07-29 (×5): 10 mL

## 2018-07-21 MED ORDER — VANCOMYCIN HCL 10 G IV SOLR
1500.0000 mg | Freq: Once | INTRAVENOUS | Status: AC
  Administered 2018-07-21: 1500 mg via INTRAVENOUS
  Filled 2018-07-21: qty 1500

## 2018-07-21 MED ORDER — SODIUM CHLORIDE 0.9 % IV SOLN
2.0000 g | INTRAVENOUS | Status: DC
  Administered 2018-07-21: 2 g via INTRAVENOUS
  Filled 2018-07-21 (×2): qty 2

## 2018-07-21 MED ORDER — VANCOMYCIN HCL IN DEXTROSE 1-5 GM/200ML-% IV SOLN: 1000.0000 mg | INTRAVENOUS | Status: DC

## 2018-07-21 MED ORDER — LIDOCAINE-PRILOCAINE 2.5-2.5 % EX CREA
TOPICAL_CREAM | Freq: Every day | CUTANEOUS | Status: DC | PRN
  Filled 2018-07-21: qty 5

## 2018-07-21 MED ORDER — SODIUM CHLORIDE 0.9% FLUSH
10.0000 mL | INTRAVENOUS | Status: DC | PRN
  Administered 2018-07-26: 10 mL
  Administered 2018-07-29: 20 mL
  Filled 2018-07-21 (×2): qty 40

## 2018-07-21 MED ORDER — IOHEXOL 300 MG/ML  SOLN
30.0000 mL | Freq: Once | INTRAMUSCULAR | Status: AC | PRN
  Administered 2018-07-21: 30 mL via ORAL

## 2018-07-21 NOTE — Anesthesia Preprocedure Evaluation (Addendum)
Anesthesia Evaluation  Patient identified by MRN, date of birth, ID band Patient awake    Reviewed: Allergy & Precautions, NPO status , Patient's Chart, lab work & pertinent test results  Airway Mallampati: II  TM Distance: >3 FB     Dental   Pulmonary former smoker,    breath sounds clear to auscultation       Cardiovascular hypertension,  Rhythm:Regular Rate:Normal     Neuro/Psych  Neuromuscular disease    GI/Hepatic hiatal hernia, GERD  ,  Endo/Other  Hypothyroidism   Renal/GU Renal disease     Musculoskeletal  (+) Arthritis ,   Abdominal   Peds  Hematology  (+) anemia ,   Anesthesia Other Findings   Reproductive/Obstetrics                            Anesthesia Physical Anesthesia Plan  ASA: III  Anesthesia Plan: General   Post-op Pain Management:    Induction: Intravenous  PONV Risk Score and Plan: 3 and Treatment may vary due to age or medical condition  Airway Management Planned: LMA  Additional Equipment:   Intra-op Plan:   Post-operative Plan: Extubation in OR  Informed Consent: I have reviewed the patients History and Physical, chart, labs and discussed the procedure including the risks, benefits and alternatives for the proposed anesthesia with the patient or authorized representative who has indicated his/her understanding and acceptance.   Dental advisory given  Plan Discussed with: CRNA and Anesthesiologist  Anesthesia Plan Comments:        Anesthesia Quick Evaluation

## 2018-07-21 NOTE — Progress Notes (Addendum)
Brief Pharmacy Note:  14 y/oF on IV heparin for bilateral DVT and PE. Patient was recently discharged on Enoxaparin 1mg /kg q12h, but converted to IV heparin on admission due to AKI. Urology consulted for hydronephrosis, UTI, and AKI and tentatively planning for procedure tomorrow.   Heparin level at 1640 = 0.62 units/mL, therapeutic on heparin infusion at 1000 units/hr.   CBC: Hgb decreased to 8.1, Pltc decreased to 66K, likely due to recent chemotherapy. Per MD notes, will plan to d/c heparin drip if platelets fall below 30K.   No bleeding or infusion issues reported per nursing.   Plan:   Continue heparin infusion at current rate of 1000 units/hr.  Check confirmatory heparin level on 07/22/18 at 0100.  Daily CBC and heparin level.  Monitor closely for s/sx of bleeding.  F/u with urology regarding when to stop heparin infusion prior to procedure.   Lindell Spar, PharmD, BCPS Pager: [phone] 07/21/2018 7:14 PM   Addendum:  Damaris Schooner to Dr. Ned Card MD, no need to stop IV heparin for urological procedure tomorrow.  Per MD, do not give any heparin boluses if heparin levels low (adjust infusion rate only).   Lindell Spar, PharmD, BCPS Pager: [phone] 07/21/2018 7:39 PM

## 2018-07-21 NOTE — Plan of Care (Signed)
  Problem: Health Behavior/Discharge Planning: Goal: Ability to manage health-related needs will improve Outcome: Progressing   Problem: Clinical Measurements: Goal: Ability to maintain clinical measurements within normal limits will improve Outcome: Progressing Goal: Will remain free from infection Outcome: Progressing Goal: Diagnostic test results will improve Outcome: Progressing   Problem: Activity: Goal: Risk for activity intolerance will decrease Outcome: Progressing   Problem: Nutrition: Goal: Adequate nutrition will be maintained Outcome: Progressing   Problem: Coping: Goal: Level of anxiety will decrease Outcome: Progressing   Problem: Elimination: Goal: Will not experience complications related to bowel motility Outcome: Progressing Goal: Will not experience complications related to urinary retention Outcome: Progressing   Problem: Safety: Goal: Ability to remain free from injury will improve Outcome: Progressing   Problem: Education: Goal: Knowledge of General Education information will improve Description Including pain rating scale, medication(s)/side effects and non-pharmacologic comfort measures Outcome: Adequate for Discharge   Problem: Clinical Measurements: Goal: Respiratory complications will improve Outcome: Adequate for Discharge Goal: Cardiovascular complication will be avoided Outcome: Adequate for Discharge   Problem: Pain Managment: Goal: General experience of comfort will improve Outcome: Adequate for Discharge   Problem: Skin Integrity: Goal: Risk for impaired skin integrity will decrease Outcome: Adequate for Discharge

## 2018-07-21 NOTE — Progress Notes (Signed)
PROGRESS NOTE    Kayla Guerrero  SWN:[id number] DOB: 06-Oct-1934 DOA: 07/20/2018 PCP: Rita Ohara, MD   Brief Narrative:  83 year old with past medical history relevant for hypertension, hypothyroidism, widely metastatic endometrial carcinoma with disease to the lungs, abdomen with recent chemotherapy on 07/12/2018, stage III CKD, recent diagnosis of submassive PE and bilateral DVT on enoxaparin who was admitted on 07/20/2018 with profound weakness, intractable nausea, vomiting and found to have AKI and fevers without clear source.   Assessment & Plan:   Active Problems:   Chemotherapy induced nausea and vomiting   Anemia   Hyponatremia   Hypothyroidism   PE (pulmonary thromboembolism) (HCC)   Leg DVT (deep venous thromboembolism), acute, bilateral ([place])   AKI (acute kidney injury) ([place])   #) Fevers in the setting of recent chemotherapy: Patient is at high risk for having invasive and hospital-acquired organisms.  She is not neutropenic though it is conceivable that she could become neutropenic. -Ordered blood cultures today on 07/18/2018 -Start IV cefepime and vancomycin 07/21/2018 -We will order procalcitonin -CT abdomen pelvis pending   #) AKI on stage III CKD: Patient presents with AKI.  Could be related to sepsis but also progression of her abdominal disease of her endometrial cancer. -IV fluids -Avoid nephrotoxins -CT abdomen pelvis without contrast pending  #) Widely metastatic endometrial carcinoma: Patient is status post 2 cycles of chemotherapy.  Per review of the oncology notes the plan was to 3 cycles of chemotherapy and then consider further options. -CT abdomen pelvis pending -Continue hydromorphone for pain control -Continue dexamethasone for nausea  #) DVT/submassive PE: Patient was recently diagnosed with this.  Likely provoked in the setting of malignancy. -Hold enoxaparin 1 mg/kg -Heparin infusion  #) Anemia/thrombus cytopenia: Likely due to recent  chemotherapy. -Supportive care, will discontinue heparin drip if platelets fall below 30  #) Hypothyroidism: -Continue levothyroxine 88 mcg  #) Pain/psych: -Continue duloxetine 30 mg nightly  #) Hypertension: -Hold amlodipine 10 mg  Fluids: IV fluids Electrodes: Monitor and supplement Nutrition: Regular diet  Prophylaxis: Heparin drip  Disposition: Ending resolution of AKI and evaluation fever  Full code    Consultants:   Oncology, Dr. Lottie Rater  Procedures:  None  Antimicrobials:   IV cefepime and vancomycin started 07/21/2018   Subjective: Patient reports he continues to feel weak.  She also reports some vague nonspecific abdominal pain.  She denies any nausea, vomiting, diarrhea, cough, congestion.  She was mildly short of breath last night and was placed on oxygen however she did not have a true desaturation.  Objective: Vitals:   07/20/18 1344 07/20/18 1424 07/20/18 2057 07/21/18 0416  BP: 126/70 114/78 (!) 152/69 (!) 162/83  Pulse: 90 96 (!) 102 76  Resp: 16 20 20 16   Temp:  100 F (37.8 C) (!) 100.6 F (38.1 C) 98 F (36.7 C)  TempSrc:  Oral Oral Oral  SpO2: 91% 100% 98% 100%    Intake/Output Summary (Last 24 hours) at 07/21/2018 1134 Last data filed at 07/21/2018 1117 Gross per 24 hour  Intake 2805.22 ml  Output 700 ml  Net 2105.22 ml   There were no vitals filed for this visit.  Examination:  General exam: Appears calm and comfortable  Respiratory system: Clear to auscultation. Respiratory effort normal.  Diminished lung sounds at bases Cardiovascular system: Regular rate and rhythm, no murmurs Gastrointestinal system: Soft, mildly tender to deep palpation, no rebound or guarding, plus bowel sounds Central nervous system: Alert and oriented. No focal neurological deficits. Extremities:  1+ lower extremity edema Skin: Port site is clean dry and intact Psychiatry: Judgement and insight appear normal. Mood & affect appropriate.     Data  Reviewed: I have personally reviewed following labs and imaging studies  CBC: Recent Labs  Lab 07/16/18 0500 07/16/18 0806 07/17/18 0419 07/20/18 0915 07/21/18 0346  WBC 3.2* 3.9* 4.2 8.2 6.4  NEUTROABS  --   --   --  7.7  --   HGB 7.1* 9.5* 9.4* 8.7* 8.1*  HCT 23.1* 30.2* 29.7* 27.0* 25.2*  MCV 87.2 84.4 84.9 82.6 82.1  PLT 142* 190 191 82* 66*   Basic Metabolic Panel: Recent Labs  Lab 07/14/18 1655 07/15/18 0452 07/17/18 0419 07/20/18 0915 07/21/18 0346  NA 138 136 135 131* 130*  K 4.3 3.7 3.6 3.8 3.7  CL 102 102 100 96* 97*  CO2 25 25 28 24  21*  GLUCOSE 119* 88 87 122* 113*  BUN 29* 24* 22 41* 45*  CREATININE 1.24* 1.03* 1.13* 2.66* 2.67*  CALCIUM 8.7* 7.9* 8.2* 7.2* 6.7*   GFR: Estimated Creatinine Clearance: 17.8 mL/min (A) (by C-G formula based on SCr of 2.67 mg/dL (H)). Liver Function Tests: Recent Labs  Lab 07/14/18 1655 07/20/18 0915 07/21/18 0346  AST 32 27 21  ALT 17 13 12   ALKPHOS 58 70 62  BILITOT 0.5 0.7 0.8  PROT 5.9* 6.1* 5.5*  ALBUMIN 3.1* 2.8* 2.4*   Recent Labs  Lab 07/14/18 1655  LIPASE 35   No results for input(s): AMMONIA in the last 168 hours. Coagulation Profile: Recent Labs  Lab 07/14/18 2240  INR 1.06   Cardiac Enzymes: Recent Labs  Lab 07/14/18 1655 07/14/18 2005 07/14/18 2323 07/15/18 0452 07/15/18 1123  TROPONINI 0.72* 0.78* 0.77* 0.58* 0.39*   BNP (last 3 results) No results for input(s): PROBNP in the last 8760 hours. HbA1C: No results for input(s): HGBA1C in the last 72 hours. CBG: Recent Labs  Lab 07/14/18 1629  GLUCAP 115*   Lipid Profile: No results for input(s): CHOL, HDL, LDLCALC, TRIG, CHOLHDL, LDLDIRECT in the last 72 hours. Thyroid Function Tests: No results for input(s): TSH, T4TOTAL, FREET4, T3FREE, THYROIDAB in the last 72 hours. Anemia Panel: No results for input(s): VITAMINB12, FOLATE, FERRITIN, TIBC, IRON, RETICCTPCT in the last 72 hours. Sepsis Labs: Recent Labs  Lab 07/21/18 0813    PROCALCITON 15.85    Recent Results (from the past 240 hour(s))  Urine culture     Status: None   Collection Time: 07/14/18  8:04 PM  Result Value Ref Range Status   Specimen Description   Final    URINE, CLEAN CATCH Performed at [facility], [place] [address] [place], [place] [address]    Special Requests   Final    Normal Performed at [facility], [place] [address] [place], [place] [address]    Culture   Final    NO GROWTH Performed at [facility], [place] [address] [place], [place] [address]    Report Status 07/16/2018 FINAL  Final  MRSA PCR Screening     Status: None   Collection Time: 07/15/18  3:00 AM  Result Value Ref Range Status   MRSA by PCR NEGATIVE NEGATIVE Final    Comment:        The GeneXpert MRSA Assay (FDA approved for NASAL specimens only), is one component of a comprehensive MRSA colonization surveillance program. It is not intended to diagnose MRSA infection nor to guide or monitor treatment for MRSA  infections. Performed at [facility], [place] [address] [place], [place] [address]          Radiology Studies: Dg Chest 2 View  Result Date: 07/20/2018 CLINICAL DATA:  History metastatic endometrial carcinoma. Weakness today. EXAM: CHEST - 2 VIEW COMPARISON:  CT chest and PA and lateral chest 07/14/2018. FINDINGS: Scattered pulmonary nodules and a large mass in the right lower lobe appear unchanged. No consolidative process, pneumothorax or effusion. Heart size is normal. Aortic atherosclerosis is noted. Port-A-Cath is in place. No acute or focal bony abnormality. IMPRESSION: No acute disease in a patient with endometrial carcinoma metastatic to the lungs. Atherosclerosis. Electronically Signed   By: Inge Rise M.D.   On: 07/20/2018 10:16        Scheduled Meds: . dexamethasone  4 mg Oral Daily  . dronabinol  2.5 mg Oral BID AC  . levothyroxine  88 mcg Oral Q0600  . Melatonin  5  mg Oral QHS  . metoCLOPramide  10 mg Oral TID AC  . sodium chloride flush  10-40 mL Intracatheter Q12H  . triamcinolone  1 spray Nasal Daily   Continuous Infusions: . sodium chloride 100 mL/hr at 07/21/18 0032  . ceFEPime (MAXIPIME) IV 2 g (07/21/18 0840)  . heparin 1,000 Units/hr (07/21/18 1117)  . vancomycin 1,500 mg (07/21/18 1015)     LOS: 0 days    Time spent: [place], MD [facility]  If 7PM-7AM, please contact night-coverage [email or web address] Password [facility] 07/21/2018, 11:34 AM

## 2018-07-21 NOTE — Care Management Note (Signed)
Case Management Note  Patient Details  Name: Kayla Guerrero MRN: [id number] Date of Birth: 08-28-1935  Subjective/Objective:  Pt admitted with AKI                  Action/Plan: Pt states that she would like to go to Rehab   Expected Discharge Date:  (unknown)               Expected Discharge Plan:  Skilled Nursing Facility  In-House Referral:  Clinical Social Work  Discharge planning Services  CM Consult  Post Acute Care Choice:    Choice offered to:     DME Arranged:    DME Agency:     HH Arranged:    [place] Agency:     Status of Service:  In process, will continue to follow  If discussed at Long Length of Stay Meetings, dates discussed:    Additional CommentsPurcell Mouton, RN 07/21/2018, 4:46 PM

## 2018-07-21 NOTE — Care Management Obs Status (Signed)
[place] NOTIFICATION   Patient Details  Name: Kayla Guerrero MRN: [id number] Date of Birth: 1935/08/13   Medicare Observation Status Notification Given:  Yes    Purcell Mouton, RN 07/21/2018, 4:45 PM

## 2018-07-21 NOTE — Progress Notes (Signed)
Kayla Guerrero   DOB:09/07/1935   SV#:[id number]    Assessment & Plan:   Metastatic uterine cancer I am concerned about possibility of disease progression I will order CT abdomen without IV contrast for further evaluation  Acute renal failure Could be either due to third spacing, sepsis, worsening disease in her abdomen or recent exposure to contrast She is currently on aggressive IV fluid resuscitation  Recent PE She will continue IV heparin I will get IV team to access support.  She is noted to be bruised  Acquired pancytopenia Likely due to side effects of recent chemotherapy, sepsis or side effects of antibiotic treatment We will monitor daily She might need transfusion support soon  Fever Cultures have been ordered but not drawn She will continue IV antibiotics for now  Moderate to severe protein calorie malnutrition Due to third spacing and poor oral intake She needs dietitian consult  Progressive weakness Due to recurrent hospitalization She may benefit from PT consult  Discharge planning She is too weak to be discharged I will return tomorrow to review test results with her   [name], MD 07/21/2018  7:45 AM   Subjective:  Patient well-known to me.  She was supposed to see me in the outpatient clinic yesterday but was admitted to the hospital due to findings of acute renal failure, fever, worrisome for sepsis. The patient was just discharged from the hospital last weekend with findings of pulmonary embolism. Her oral intake has been poor for the last few days She also have mild nausea.  She denies constipation. Blood work yesterday showed significant pancytopenia, abnormal urinalysis and findings of acute renal failure. Unfortunately, I did not see blood or urine cultures being drawn. 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 Microscopic Comment The overall appearance favors serous carcinoma.     04/23/2016 Initial Diagnosis    Patient presented to PCP with new vaginal discharge and some vaginal spotting, referred to Dr Dory Horn, whom she had known previously. D&C on 04-09-16 had high grade carcinoma favoring serous histology [phone]). She was referred to gyn oncology, saw Dr Denman George on 04-23-16.     04/30/2016 Imaging    Markedly thickened/widened endometrium consistent with known endometrial cancer. No evidence of serosal or extra uterine extension. 2. No findings to suggest metastatic disease involving the chest, abdomen or pelvis. 3. Indeterminant 12.5 mm right renal lesion, small enhancing mass versus hemorrhagic cyst. Attention on future scans is suggested. 4. Atherosclerotic calcifications involving the thoracic and abdominal aorta and branch vessels but no focal aneurysm. 5. Moderate stool throughout the colon and down into the rectum may suggest constipation.     05/06/2016 Surgery    Robotic hysterectomy and staging. IB USC, 0/11 nodes. Dispositioned to chemotherapy with paclitaxel and carboplatin x 6 with vaginal brachytherapy.    05/06/2016 Pathology Results    1. Lymph node, biopsy, right peri-aortic - ONE OF ONE LYMPH NODES NEGATIVE FOR MALIGNANCY (0/1). 2. Lymph node, biopsy, left peri-aortic - ONE OF ONE LYMPH NODES NEGATIVE FOR MALIGNANCY (0/1). 3. Lymph node, biopsy, right pelvic - FOUR OF FOUR LYMPH NODES NEGATIVE FOR MALIGNANCY (0/4). 4. Lymph node, biopsy, left pelvic - FIVE OF  FIVE LYMPH NODES NEGATIVE FOR MALIGNANCY (0/5). 5. Uterus +/- tubes/ovaries, neoplastic - UTERUS: -ENDO/MYOMETRIUM: INVASIVE MIXED ENDOMETRIOID AND SEROUS CARCINOMA, SPANNING 4 CM. TUMOR INVADES OUTER HALF OF MYOMETRIUM. LYMPHOVASCULAR  INVASION PRESENT. SEE ONCOLOGY TABLE. LEIOMYOMA. -SEROSA: UNINVOLVED. NO MALIGNANCY. - CERVIX: BENIGN SQUAMOUS AND ENDOCERVICAL MUCOSA. NO DYSPLASIA OR MALIGNANCY. - BILATERAL OVARIES: INCLUSION CYSTS. NO MALIGNANCY. - BILATERAL FALLOPIAN TUBES: UNREMARKABLE. NO MALIGNANCY.  Specimen: Uterus, cervix, bilateral ovaries and fallopian tubes, bilateral pelvic and para-aortic lymph nodes. Procedure: Hysterectomy with bilateral salpingo-oophorectomy. Lymph node sampling performed: Bilateral pelvic and para-aortic lymph node biopsies Specimen integrity: Intact. 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    05/16/2016 Imaging    Ct abdomen: Extensive subcutaneous emphysema about the abdomen and pelvis, likely postoperative. Probable abdominal pelvic wall small volume hematomas, as above. 2. Right pelvic sidewall fluid collection is likely a seroma or lymphangioma. No explanation for left lower extremity pain. 3. Minimal ill-defined fluid in the presacral space and left adnexa. 4. Small hiatal hernia. 5. An incidental finding of potential clinical significance has been found. Indeterminate right renal lesion is similar to on the recent exam. Consider further evaluation with dedicated outpatient pre and post contrast abdominal MRI    06/11/2016 - 08/22/2016 Chemotherapy    The patient completed only three cycles due to toxicity from paclitaxel and carboplatin.     07/29/2016 Imaging    CT angiogram chest: No demonstrable pulmonary embolus. Multiple foci of atherosclerotic calcification in the aorta as well as foci of coronary artery calcification.  No edema or consolidation. No lung mass or nodule lesion. No adenopathy. Gallbladder absent.  Stable mild biliary duct prominence. Stable nodular opacity right lobe of thyroid which does not meet consensus guidelines criteria for further assessment    08/06/2016 - 09/03/2016 Radiation Therapy    HDR vaginal  cuff brachytherapy x 5 fractions.    08/11/2016 Procedure    Ultrasound and fluoroscopically guided right internal jugular single lumen power port catheter insertion. Tip in the SVC/RA junction. Catheter ready for use.    09/23/2016 Imaging    Ct abdomen: No findings suspicious for metastatic disease in the abdomen or pelvis. 2. Thin-walled lobulated 6.9 x 5.8 x 5.8 cm right pelvic sidewall fluid collection, increased in size since 05/16/2016 CT study, favor a postoperative lymphocele, which demonstrates extrinsic mass-effect on the right bladder wall. 3. Indeterminate 1.2 cm posterior interpolar right renal cortical lesion, for which 4 month stability has been demonstrated, renal neoplasm not excluded. Recommend either dedicated renal protocol MRI or CT abdomen without and with IV contrast or continued attention on follow-up surveillance CT studies, as clinically warranted. 4. Additional findings include aortic atherosclerosis, moderate hiatal hernia, moderate sigmoid diverticulosis, tiny fat containing umbilical hernia, and degenerative disc disease, facet arthropathy and spondylolisthesis in the lower lumbar spine    09/30/2016 Procedure    She had successful CT-guided aspiration of right pelvic fluid collection. Approximately 90 mL yellow serous fluid was aspirated. Samples were sent for culture and cytology.    11/19/2016 Imaging    CT abdomen: No CT findings to suggest recurrent tumor, lymphadenopathy or metastatic disease. 2. Stable right-sided pelvic sidewall cyst with mass effect on the bladder. 3. Stable advanced atherosclerotic calcifications involving the aorta and iliac arteries. 4. Moderate size hiatal hernia. 5. Status post cholecystectomy with stable intra and extrahepatic biliary dilatation    12/29/2016 Pathology Results    SOFT TISSUE, FINE NEEDLE ASPIRATION, RIGHT ADNEXAL CYST (SPECIMEN 1 OF 1  COLLECTED 09-30-2016) NO MALIGNANT CELLS IDENTIFIED.    02/17/2017 Imaging    CT  abdomen 1. The previously noted thin walled cystic lesion in the right pelvis has undergone change in appearance ; it is now thick-walled and rim enhancing. There is associated inflammation and edema surrounding the pelvic sidewall lesion which has also increased in size. This suggests interim inflammation/infection of the cystic collection. 2. Urinary bladder displaced to the left by the thick-walled right pelvic sidewall cystic lesion. Wall thickening of the bladder is likely reactive but could also be due to a cystitis. Suggest correlation with urinalysis. 3. Stable intra and extrahepatic biliary dilatation post cholecystectomy 4. Stable 13 mm intermediate density right renal lesion 5. Diffuse diverticular disease of the colon without acute inflammation    02/17/2017 - 02/20/2017 Hospital Admission    She presented to the emergency department due to lower abdominal pain, diarrhea for 4 days associated with weakness, hypersomnolence following an episode of a gastroenteritis about a week ago after returning from the beach. She was told to decrease her Cymbalta to decrease somnolence. CT Abd/Pelvis was done which revealed that the previously seen thinwall cystic lesions in the right pelvis is now take wall and ring-enhancing with associated inflammation and edema surrounding the pelvic sidewall lesions which also has increased in size. This suggests inter-inflammation/infection of the cystic collection. The urinary bladder is displaced to the left by these lesions. General Surgery, IR, and GYN were consulted.She underwent IR Drain Right Pelvic Drain placement on 5/23 and is improving. Drain Fluid Cx grew out some Streptococcus Viridans and Cytology is negative. Her symptoms resolved with antibiotics     02/18/2017 Procedure    Successful CT guided placement of a 10 French all purpose drain catheter into the residual/recurrent right pelvic sidewall fluid collection with aspiration of 50 mL of purulent fluid.  Samples were sent to the laboratory for both culture and cytologic analysis.    02/25/2017 Imaging    Significant improvement in the bilobed right pelvic sidewall abscess following percutaneous drain. Stable drain catheter position. Small residual abscess is noted, measurements as above. No new abscess. Otherwise stable CT of the abdomen pelvis with contrast.    03/11/2017 Imaging    Ct abdomen: Resolution of right pelvic infected fluid collection after percutaneous drainage. No further fluid is seen around the percutaneous drain and acute inflammatory changes also have nearly resolved    12/03/2017 Imaging    No abnormality identified within the pelvis.    03/23/2018 Procedure    Technically successful tunneled Port catheter removal.    06/11/2018 Imaging    There are several findings most likely related to metastatic malignancy described below:  Multiple bilateral pulmonary nodules and masses, some of which are cavitary.  Abnormal mediastinal adenopathy.  Multiple liver lesions.  Irregular soft tissue mass in the right side of the pelvis resulting in right ureteral obstruction.  Central mesentery mass in the abdomen extending to the ascending and transverse colon causing wall thickening.  2.4 cm lobulated mass in the distal sigmoid colon    06/14/2018 Tumor Marker    Patient's tumor was tested for the following markers: CA-125 Results of the tumor marker test revealed 96.6     Genetic Testing    Patient has genetic testing done for MSI on Surgical Patholgy from 05/06/2016. Results revealed patient has the following: MSI: stable.    06/21/2018 -  Chemotherapy    The patient had carboplatin    07/12/2018 Tumor Marker    Patient's  tumor was tested for the following markers: CA-125 Results of the tumor marker test revealed 95.2     Objective:  Vitals:   07/20/18 2057 07/21/18 0416  BP: (!) 152/69 (!) 162/83  Pulse: (!) 102 76  Resp: 20 16  Temp: (!) 100.6 F (38.1 C)  98 F (36.7 C)  SpO2: 98% 100%     Intake/Output Summary (Last 24 hours) at 07/21/2018 0745 Last data filed at 07/21/2018 0500 Gross per 24 hour  Intake 2535.78 ml  Output 400 ml  Net 2135.78 ml    GENERAL:alert, no distress and comfortable.  She has oxygen delivered via nasal cannula.  She appears weak SKIN: skin color is pale, texture, turgor are normal, no rashes or significant lesions EYES: normal, Conjunctiva are pale and non-injected, sclera clear OROPHARYNX:no exudate, no erythema and lips, buccal mucosa, and tongue normal  NECK: supple, thyroid normal size, non-tender, without nodularity LYMPH:  no palpable lymphadenopathy in the cervical, axillary or inguinal LUNGS: clear to auscultation and percussion with normal breathing effort HEART: regular rate & rhythm and no murmurs and no lower extremity edema ABDOMEN:abdomen soft, non-tender and normal bowel sounds.  Her abdomen appears distended Musculoskeletal:no cyanosis of digits and no clubbing  NEURO: alert & oriented x 3 with fluent speech, no focal motor/sensory deficits   Labs:  Lab Results  Component Value Date   WBC 6.4 07/21/2018   HGB 8.1 (L) 07/21/2018   HCT 25.2 (L) 07/21/2018   MCV 82.1 07/21/2018   PLT 66 (L) 07/21/2018   NEUTROABS 7.7 07/20/2018    Lab Results  Component Value Date   NA 130 (L) 07/21/2018   K 3.7 07/21/2018   CL 97 (L) 07/21/2018   CO2 21 (L) 07/21/2018    Studies:  Dg Chest 2 View  Result Date: 07/20/2018 CLINICAL DATA:  History metastatic endometrial carcinoma. Weakness today. EXAM: CHEST - 2 VIEW COMPARISON:  CT chest and PA and lateral chest 07/14/2018. FINDINGS: Scattered pulmonary nodules and a large mass in the right lower lobe appear unchanged. No consolidative process, pneumothorax or effusion. Heart size is normal. Aortic atherosclerosis is noted. Port-A-Cath is in place. No acute or focal bony abnormality. IMPRESSION: No acute disease in a patient with endometrial  carcinoma metastatic to the lungs. Atherosclerosis. Electronically Signed   By: Inge Rise M.D.   On: 07/20/2018 10:16

## 2018-07-21 NOTE — Progress Notes (Signed)
Pharmacy Antibiotic Note  Kayla Guerrero is a 83 y.o. female admitted on 07/20/2018 , r/o sepsis.  Pharmacy has been consulted for Vancomycin dosing.  Plan: Cefepime 2gm q24 per MD Vancomycin 1500mg  x1, then 1gm q48hr     Temp (24hrs), Avg:99.2 F (37.3 C), Min:98 F (36.7 C), Max:100.6 F (38.1 C)  Recent Labs  Lab 07/14/18 1655 07/15/18 0452 07/16/18 0500 07/16/18 0806 07/17/18 0419 07/20/18 0915 07/21/18 0346  WBC 5.1 4.7 3.2* 3.9* 4.2 8.2 6.4  CREATININE 1.24* 1.03*  --   --  1.13* 2.66* 2.67*    Estimated Creatinine Clearance: 17.8 mL/min (A) (by C-G formula based on SCr of 2.67 mg/dL (H)).    Allergies  Allergen Reactions  . Levofloxacin Other (See Comments)    Unknown  . Promethazine Hcl Other (See Comments)    fainting  . Sulfa Antibiotics Swelling  . Sulfa Drugs Cross Reactors Swelling  . Sulfur Swelling   Antimicrobials this admission: 10/23 Cefepime >>  10/23 Vanc >>   Dose adjustments this admission:  Microbiology results: 10/23 BCx: sent 10/23 UCx: sent   Thank you for allowing pharmacy to be a part of this patient's care.  Minda Ditto 07/21/2018 7:43 AM

## 2018-07-21 NOTE — Consult Note (Signed)
Urology Consult   Physician requesting consult: Dr. Herbert Moors  Reason for consult: Hydronephrosis, UTI, AKI  History of Present Illness: Kayla Guerrero is a 83 y.o. with metastatic endometrial cancer who has been followed in our office by Dr. Franchot Gallo last year for a complex right renal lesion.  She was receiving systemic therapy per Dr. Alvy Bimler and her CT scan in mid September revealed new onset severe right hydronephrosis but with stable renal function and she had remained asymptomatic.  She has been treated with Carboplatin most recently and had completed two cycles. Recently, she was hospitalized with a PE and began anticoagulation.  She was eventually discharged but presented back to the ED two days later with extreme weakness and fatigue.  She was noted to have evidence of a probable UTI with AKI (Cr up to 2.6 from baseline of 1.0) and a CT scan per Dr. Alvy Bimler was repeated and revealed persistent right hydronephrosis with new mild left hydronephrosis.  She has noted mild left upper quadrant pain but denies any flank pain.  She had not had hematuria.  In addition, she had had a low grade temperature earlier in her admission but is currently afebrile.  Past Medical History:  Diagnosis Date  . Aortic atherosclerosis ([place])   . Arthritis   . Arthritis of knee, left   . Borderline osteopenia DEXA 2006 and 2012  . Cancer [facility])    endometrial  . Cataract   . Cholesterol serum elevated   . Colon polyps   . DDD (degenerative disc disease), lumbar   . DDD (degenerative disc disease), lumbar   . Diverticulitis of sigmoid colon    Moderate  . Facet arthropathy, lumbar   . FHx: colon cancer   . GERD (gastroesophageal reflux disease)   . History of hiatal hernia    Moderate  . History of radiation therapy 08/06/16 - 09/03/16   vaginal cuff treated to 30 Gy in 5 fractions  . Hypothyroidism   . Kidney lesion, native, right   . Left foot drop   . Leukopenia   . Neuropathy    chemo  indiced ; in right hand and bilateral feet   . Obesity   . Pelvic abscess in female 01/2017   Right  . Spondylisthesis   . Umbilical hernia     Past Surgical History:  Procedure Laterality Date  . CATARACT EXTRACTION, BILATERAL    . CHOLECYSTECTOMY    . COLONOSCOPY  2010, 04/2013   Buccini  . IR GENERIC HISTORICAL  08/11/2016   IR FLUORO GUIDE PORT INSERTION RIGHT 08/11/2016 Greggory Keen, MD [facility]  . IR GENERIC HISTORICAL  08/11/2016   IR US GUIDE VASC ACCESS RIGHT 08/11/2016 Greggory Keen, MD [facility]  . IR IMAGING GUIDED PORT INSERTION  06/25/2018  . IR RADIOLOGIST EVAL & MGMT  02/25/2017  . IR RADIOLOGIST EVAL & MGMT  03/11/2017  . IR REMOVAL TUN ACCESS W/ PORT W/O FL MOD SED  03/23/2018  . PTERYGIUM EXCISION    . ROBOTIC ASSISTED TOTAL HYSTERECTOMY WITH BILATERAL SALPINGO OOPHERECTOMY N/A 05/06/2016   Procedure: XI ROBOTIC ASSISTED  LAPAROSCOPIC TOTAL HYSTERECTOMY WITH BILATERAL SALPINGO OOPHORECTOMY;  Surgeon: Nancy Marus, MD;  Location: [facility];  Service: Gynecology;  Laterality: N/A;  . SENTINEL NODE BIOPSY N/A 05/06/2016   Procedure: SENTINEL NODE BIOPSY;  Surgeon: Nancy Marus, MD;  Location: [facility];  Service: Gynecology;  Laterality: N/A;  . TOTAL KNEE ARTHROPLASTY Left 04/12/2018   Procedure: LEFT TOTAL KNEE ARTHROPLASTY;  Surgeon:  Gaynelle Arabian, MD;  Location: [facility];  Service: Orthopedics;  Laterality: Left;  . TUBAL LIGATION       Current Hospital Medications:  Home meds:  Current Facility-Administered Medications on File Prior to Encounter  Medication Dose Route Frequency Provider Last Rate Last Dose  . influenza  inactive virus vaccine (FLUZONE/FLUARIX) injection 0.5 mL  0.5 mL Intramuscular Once Rita Ohara, MD       Current Outpatient Medications on File Prior to Encounter  Medication Sig Dispense Refill  . amLODipine (NORVASC) 10 MG tablet Take 1 tablet (10 mg total) by mouth daily. 30 tablet 0  . dexamethasone (DECADRON) 4 MG tablet Take 1 tablet (4  mg total) by mouth daily. (Patient taking differently: Take 4 mg by mouth daily. Pt takes each morning) 90 tablet 1  . dronabinol (MARINOL) 2.5 MG capsule Take 1 capsule (2.5 mg total) by mouth 2 (two) times daily before lunch and supper. 60 capsule 0  . DULoxetine (CYMBALTA) 30 MG capsule Take 30 mg by mouth at bedtime.    . enoxaparin (LOVENOX) 80 MG/0.8ML injection Inject 0.8 mLs (80 mg total) into the skin every 12 (twelve) hours. 48 mL 0  . HYDROmorphone (DILAUDID) 2 MG tablet Take 1 tablet (2 mg total) by mouth every 4 (four) hours as needed for severe pain. 30 tablet 0  . levothyroxine (SYNTHROID, LEVOTHROID) 88 MCG tablet TAKE ONE TABLET BY MOUTH ONCE DAILY BEFORE  BREAKFAST. 90 tablet 3  . lidocaine-prilocaine (EMLA) cream Apply 1 application topically as needed. (Patient taking differently: Apply 1 application topically as needed (port access). ) 30 g 6  . Melatonin 5 MG CAPS Take 5 mg by mouth at bedtime.     . metoCLOPramide (REGLAN) 10 MG tablet Take 1 tablet (10 mg total) by mouth 3 (three) times daily before meals. 90 tablet 11  . ondansetron (ZOFRAN) 8 MG tablet Take 1 tablet (8 mg total) by mouth every 8 (eight) hours as needed for nausea. 30 tablet 3  . Polyethyl Glycol-Propyl Glycol (SYSTANE ULTRA) 0.4-0.3 % SOLN Place 1 drop into both eyes daily as needed (for dry eyes).    . polyethylene glycol (MIRALAX / GLYCOLAX) packet Take 17 g by mouth daily.     [name] triamcinolone (NASACORT) 55 MCG/ACT AERO nasal inhaler Place 1 spray into the nose 2 (two) times daily. 1 Inhaler 12  . vitamin B-12 (CYANOCOBALAMIN) 1000 MCG tablet Take 1,000 mcg by mouth daily.       Scheduled Meds: . dexamethasone  4 mg Oral Daily  . dronabinol  2.5 mg Oral BID AC  . levothyroxine  88 mcg Oral Q0600  . Melatonin  5 mg Oral QHS  . metoCLOPramide  10 mg Oral TID AC  . sodium chloride flush  10-40 mL Intracatheter Q12H  . triamcinolone  1 spray Nasal Daily   Continuous Infusions: . sodium chloride 100  mL/hr at 07/21/18 1741  . ceFEPime (MAXIPIME) IV 2 g (07/21/18 0840)  . heparin 1,000 Units/hr (07/21/18 1741)  . [START ON 07/23/2018] vancomycin     PRN Meds:.acetaminophen **OR** acetaminophen, HYDROmorphone, lidocaine-prilocaine, ondansetron **OR** ondansetron (ZOFRAN) IV, polyvinyl alcohol, sodium chloride flush  Allergies:  Allergies  Allergen Reactions  . Levofloxacin Other (See Comments)    Unknown  . Promethazine Hcl Other (See Comments)    fainting  . Sulfa Antibiotics Swelling  . Sulfa Drugs Cross Reactors Swelling  . Sulfur Swelling    Family History  Problem Relation Age of Onset  . Cancer  Mother 14       COLON   . Heart disease Mother   . Cancer Maternal Aunt   . Breast cancer Maternal Aunt   . Mental illness Maternal Uncle   . Cancer Maternal Grandmother        stomach? more likely uterine  . Macular degeneration Maternal Grandmother   . Diabetes Neg Hx     Social History:  reports that she quit smoking about 37 years ago. Her smoking use included cigarettes. She smoked 1.00 pack per day. She has never used smokeless tobacco. She reports that she drinks alcohol. She reports that she does not use drugs.  ROS: A complete review of systems was performed.  All systems are negative except for pertinent findings as noted.  Physical Exam:  Vital signs in last 24 hours: Temp:  [98 F (36.7 C)-100.6 F (38.1 C)] 98 F (36.7 C) (10/23 1408) Pulse Rate:  [76-102] 82 (10/23 1410) Resp:  [16-20] 18 (10/23 1410) BP: (152-175)/(69-83) 154/78 (10/23 1410) SpO2:  [94 %-100 %] 94 % (10/23 1410) Constitutional:  Alert and oriented, No acute distress Cardiovascular: Regular rate and rhythm, No JVD Respiratory: Normal respiratory effort, Lungs clear bilaterally GI: Abdomen is soft, nontender, nondistended, no abdominal masses GU: Mild CVA tenderness, no right CVAT Lymphatic: No lymphadenopathy Neurologic: Grossly intact, no focal deficits Psychiatric: Normal mood and  affect  Laboratory Data:  Recent Labs    07/20/18 0915 07/21/18 0346  WBC 8.2 6.4  HGB 8.7* 8.1*  HCT 27.0* 25.2*  PLT 82* 66*    Recent Labs    07/20/18 0915 07/21/18 0346  NA 131* 130*  K 3.8 3.7  CL 96* 97*  GLUCOSE 122* 113*  BUN 41* 45*  CALCIUM 7.2* 6.7*  CREATININE 2.66* 2.67*     Results for orders placed or performed during the hospital encounter of 07/20/18 (from the past 24 hour(s))  Urinalysis, Routine w reflex microscopic     Status: Abnormal   Collection Time: 07/20/18  6:30 PM  Result Value Ref Range   Color, Urine YELLOW YELLOW   APPearance CLOUDY (A) CLEAR   Specific Gravity, Urine 1.012 1.005 - 1.030   pH 6.0 5.0 - 8.0   Glucose, UA NEGATIVE NEGATIVE mg/dL   Hgb urine dipstick MODERATE (A) NEGATIVE   Bilirubin Urine NEGATIVE NEGATIVE   Ketones, ur NEGATIVE NEGATIVE mg/dL   Protein, ur 100 (A) NEGATIVE mg/dL   Nitrite NEGATIVE NEGATIVE   Leukocytes, UA LARGE (A) NEGATIVE   RBC / HPF 0-5 0 - 5 RBC/hpf   WBC, UA >50 (H) 0 - 5 WBC/hpf   Bacteria, UA MANY (A) NONE SEEN   Squamous Epithelial / LPF 0-5 0 - 5   WBC Clumps PRESENT    Non Squamous Epithelial 0-5 (A) NONE SEEN  Comprehensive metabolic panel     Status: Abnormal   Collection Time: 07/21/18  3:46 AM  Result Value Ref Range   Sodium 130 (L) 135 - 145 mmol/L   Potassium 3.7 3.5 - 5.1 mmol/L   Chloride 97 (L) 98 - 111 mmol/L   CO2 21 (L) 22 - 32 mmol/L   Glucose, Bld 113 (H) 70 - 99 mg/dL   BUN 45 (H) 8 - 23 mg/dL   Creatinine, Ser 2.67 (H) 0.44 - 1.00 mg/dL   Calcium 6.7 (L) 8.9 - 10.3 mg/dL   Total Protein 5.5 (L) 6.5 - 8.1 g/dL   Albumin 2.4 (L) 3.5 - 5.0 g/dL   AST 21 15 -  41 U/L   ALT 12 0 - 44 U/L   Alkaline Phosphatase 62 38 - 126 U/L   Total Bilirubin 0.8 0.3 - 1.2 mg/dL   GFR calc non Af Amer 16 (L) >60 mL/min   GFR calc Af Amer 18 (L) >60 mL/min   Anion gap 12 5 - 15  CBC     Status: Abnormal   Collection Time: 07/21/18  3:46 AM  Result Value Ref Range   WBC 6.4 4.0 -  10.5 K/uL   RBC 3.07 (L) 3.87 - 5.11 MIL/uL   Hemoglobin 8.1 (L) 12.0 - 15.0 g/dL   HCT 25.2 (L) 36.0 - 46.0 %   MCV 82.1 80.0 - 100.0 fL   MCH 26.4 26.0 - 34.0 pg   MCHC 32.1 30.0 - 36.0 g/dL   RDW 16.7 (H) 11.5 - 15.5 %   Platelets 66 (L) 150 - 400 K/uL   nRBC 0.0 0.0 - 0.2 %  Procalcitonin - Baseline     Status: None   Collection Time: 07/21/18  8:13 AM  Result Value Ref Range   Procalcitonin 15.85 ng/mL  Heparin level (unfractionated)     Status: None   Collection Time: 07/21/18  4:40 PM  Result Value Ref Range   Heparin Unfractionated 0.62 0.30 - 0.70 IU/mL   Recent Results (from the past 240 hour(s))  Urine culture     Status: None   Collection Time: 07/14/18  8:04 PM  Result Value Ref Range Status   Specimen Description   Final    URINE, CLEAN CATCH Performed at [facility], [place] [address] [place], [place] [address]    Special Requests   Final    Normal Performed at [facility], [place] [address], [place], [place] [address]    Culture   Final    NO GROWTH Performed at [facility], [place] [address], [place], [place] [address]    Report Status 07/16/2018 FINAL  Final  MRSA PCR Screening     Status: None   Collection Time: 07/15/18  3:00 AM  Result Value Ref Range Status   MRSA by PCR NEGATIVE NEGATIVE Final    Comment:        The GeneXpert MRSA Assay (FDA approved for NASAL specimens only), is one component of a comprehensive MRSA colonization surveillance program. It is not intended to diagnose MRSA infection nor to guide or monitor treatment for MRSA infections. Performed at [facility], [place] [address], [place], [place] [address]     Renal Function: Recent Labs    07/15/18 0452 07/17/18 0419 07/20/18 0915 07/21/18 0346  CREATININE 1.03* 1.13* 2.66* 2.67*   Estimated Creatinine Clearance: 17.8 mL/min (A) (by C-G formula based on SCr of 2.67 mg/dL (H)).  Radiologic Imaging: Ct Abdomen  Pelvis Wo Contrast  Result Date: 07/21/2018 CLINICAL DATA:  Endometrial carcinoma.  Lung mass. EXAM: CT ABDOMEN AND PELVIS WITHOUT CONTRAST TECHNIQUE: Multidetector CT imaging of the abdomen and pelvis was performed following the standard protocol without IV contrast. COMPARISON:  CTA 07/14/2018, CT 06/11/2018 FINDINGS: Lower chest: Large RIGHT lower lobe mass again demonstrated measuring 8.8 cm compared to 7.8 cm on recent CT. There is central gas within lesions suggesting necrotic tumor. Hepatobiliary: No focal hepatic lesion on noncontrast exam. Postcholecystectomy. Pancreas: Pancreas is normal. No ductal dilatation. No pancreatic inflammation. Spleen: Normal spleen Adrenals/urinary tract: Adrenal glands normal. There is severe hydronephrosis and hydroureter on the RIGHT. Hydroureter extends to the distal ureter  where there is a mass along the RIGHT lateral wall of the bladder measuring 3.9 by 2.5 cm (image 69/2) which compares to 5.4 by 3.3 cm for potential decrease in size. There is mild hydronephrosis hydroureter ofthe LEFT collecting system without obstructing lesion identified. The hydroureter extends to the mid ureter and is increased from prior. Bladder normal Stomach/Bowel: Hiatal hernia. Stomach, duodenum small-bowel normal. No evidence of bowel obstruction. Appendix is normal. Colon rectosigmoid colon are normal Vascular/Lymphatic: Abdominal aortic normal caliber. There is intimal calcification. No retroperitoneal adenopathy. No pelvic adenopathy Reproductive: Post hysterectomy. Other: There is thickening within the ventral mesentery superior to the transverse colon (lesser omentum) measuring 7.5 by 1.9. This finding is similar to comparison exam. Small peritoneal implants along thesigmoid colon (image 57/2) also unchanged. Musculoskeletal: No aggressive osseous lesion IMPRESSION: 1. RIGHT lower lobe necrotic mass slightly increased in size from comparison exam is concerning for necrotic neoplasm /  metastasis. 2. Severe hydronephrosis and hydroureter of the RIGHT collecting system secondary to obstructing at RIGHT pelvic sidewall. No significant change hydronephrosis. 3. No significant change in RIGHT pelvic sidewall mass. 4. New mild hydronephrosis hydroureter on the LEFT. No clear obstructing lesion identified. 5. Stable peritoneal and omental metastasis. Electronically Signed   By: Suzy Bouchard M.D.   On: 07/21/2018 12:49   Dg Chest 2 View  Result Date: 07/20/2018 CLINICAL DATA:  History metastatic endometrial carcinoma. Weakness today. EXAM: CHEST - 2 VIEW COMPARISON:  CT chest and PA and lateral chest 07/14/2018. FINDINGS: Scattered pulmonary nodules and a large mass in the right lower lobe appear unchanged. No consolidative process, pneumothorax or effusion. Heart size is normal. Aortic atherosclerosis is noted. Port-A-Cath is in place. No acute or focal bony abnormality. IMPRESSION: No acute disease in a patient with endometrial carcinoma metastatic to the lungs. Atherosclerosis. Electronically Signed   By: Inge Rise M.D.   On: 07/20/2018 10:16    I independently reviewed the above imaging studies.  Impression/Recommendation: Bilateral hydronephrosis with AKI and UTI:  I discussed her situation in detail with her tonight.  Considering her progressive hydronephrosis now with AKI and a UTI with recent low grade fever, I do think she would be best served with renal drainage.  Since she is afebrile, we will tentatively plan to perform this tomorrow.  If she develops fever tonight, we would proceed more urgently.  We reviewed the planned procedure in detail including the indications, risks, and potential symptoms of chronic ureteral stents.  She understands that retrograde stenting may not be possible, especially on the right side, and she may ultimately require percutaneous nephrostomy placement.  Will make her NPO after midnight tonight.  Ok to continue anticoagulation with IV  heparin.  Tajai Suder,LES 07/21/2018, 5:57 PM  Pryor Curia. MD   CC: Dr. Herbert Moors

## 2018-07-22 ENCOUNTER — Inpatient Hospital Stay (HOSPITAL_COMMUNITY): Payer: Medicare Other | Admitting: Anesthesiology

## 2018-07-22 ENCOUNTER — Encounter (HOSPITAL_COMMUNITY)
Admission: EM | Disposition: A | Discharge status: Home / Self Care | Payer: Self-pay | Source: Home / Self Care | Attending: Internal Medicine

## 2018-07-22 ENCOUNTER — Encounter (HOSPITAL_COMMUNITY): Payer: Self-pay | Admitting: Certified Registered Nurse Anesthetist

## 2018-07-22 ENCOUNTER — Inpatient Hospital Stay (HOSPITAL_COMMUNITY): Payer: Medicare Other

## 2018-07-22 DIAGNOSIS — E871 Hypo-osmolality and hyponatremia: Secondary | ICD-10-CM | POA: Diagnosis present

## 2018-07-22 DIAGNOSIS — N136 Pyonephrosis: Secondary | ICD-10-CM | POA: Diagnosis present

## 2018-07-22 DIAGNOSIS — E440 Moderate protein-calorie malnutrition: Secondary | ICD-10-CM | POA: Diagnosis present

## 2018-07-22 DIAGNOSIS — E860 Dehydration: Secondary | ICD-10-CM | POA: Diagnosis present

## 2018-07-22 DIAGNOSIS — I700 Atherosclerosis of aorta: Secondary | ICD-10-CM | POA: Diagnosis present

## 2018-07-22 DIAGNOSIS — C799 Secondary malignant neoplasm of unspecified site: Secondary | ICD-10-CM | POA: Diagnosis not present

## 2018-07-22 DIAGNOSIS — A4151 Sepsis due to Escherichia coli [E. coli]: Secondary | ICD-10-CM | POA: Diagnosis present

## 2018-07-22 DIAGNOSIS — C786 Secondary malignant neoplasm of retroperitoneum and peritoneum: Secondary | ICD-10-CM | POA: Diagnosis present

## 2018-07-22 DIAGNOSIS — D61810 Antineoplastic chemotherapy induced pancytopenia: Secondary | ICD-10-CM | POA: Diagnosis present

## 2018-07-22 DIAGNOSIS — I2699 Other pulmonary embolism without acute cor pulmonale: Secondary | ICD-10-CM | POA: Diagnosis present

## 2018-07-22 DIAGNOSIS — R112 Nausea with vomiting, unspecified: Secondary | ICD-10-CM | POA: Diagnosis present

## 2018-07-22 DIAGNOSIS — N179 Acute kidney failure, unspecified: Secondary | ICD-10-CM | POA: Diagnosis present

## 2018-07-22 DIAGNOSIS — C787 Secondary malignant neoplasm of liver and intrahepatic bile duct: Secondary | ICD-10-CM | POA: Diagnosis present

## 2018-07-22 DIAGNOSIS — C7800 Secondary malignant neoplasm of unspecified lung: Secondary | ICD-10-CM | POA: Diagnosis present

## 2018-07-22 DIAGNOSIS — Z6829 Body mass index (BMI) 29.0-29.9, adult: Secondary | ICD-10-CM | POA: Diagnosis not present

## 2018-07-22 DIAGNOSIS — C541 Malignant neoplasm of endometrium: Secondary | ICD-10-CM | POA: Diagnosis present

## 2018-07-22 DIAGNOSIS — E43 Unspecified severe protein-calorie malnutrition: Secondary | ICD-10-CM | POA: Diagnosis not present

## 2018-07-22 DIAGNOSIS — C55 Malignant neoplasm of uterus, part unspecified: Secondary | ICD-10-CM | POA: Diagnosis not present

## 2018-07-22 DIAGNOSIS — D696 Thrombocytopenia, unspecified: Secondary | ICD-10-CM | POA: Diagnosis not present

## 2018-07-22 DIAGNOSIS — K625 Hemorrhage of anus and rectum: Secondary | ICD-10-CM | POA: Diagnosis not present

## 2018-07-22 DIAGNOSIS — I129 Hypertensive chronic kidney disease with stage 1 through stage 4 chronic kidney disease, or unspecified chronic kidney disease: Secondary | ICD-10-CM | POA: Diagnosis present

## 2018-07-22 DIAGNOSIS — N183 Chronic kidney disease, stage 3 (moderate): Secondary | ICD-10-CM | POA: Diagnosis present

## 2018-07-22 DIAGNOSIS — I82403 Acute embolism and thrombosis of unspecified deep veins of lower extremity, bilateral: Secondary | ICD-10-CM | POA: Diagnosis present

## 2018-07-22 HISTORY — PX: CYSTOSCOPY WITH STENT PLACEMENT: SHX5790

## 2018-07-22 LAB — COMPREHENSIVE METABOLIC PANEL
ALT: 12 U/L (ref 0–44)
Alkaline Phosphatase: 62 U/L (ref 38–126)
Anion gap: 10 (ref 5–15)
CO2: 20 mmol/L — ABNORMAL LOW (ref 22–32)
Calcium: 7.2 mg/dL — ABNORMAL LOW (ref 8.9–10.3)
Creatinine, Ser: 2.42 mg/dL — ABNORMAL HIGH (ref 0.44–1.00)
GFR calc Af Amer: 20 mL/min — ABNORMAL LOW (ref >60)
GFR calc non Af Amer: 18 mL/min — ABNORMAL LOW (ref >60)
Total Bilirubin: 0.4 mg/dL (ref 0.3–1.2)
Total Protein: 5.4 g/dL — ABNORMAL LOW (ref 6.5–8.1)

## 2018-07-22 LAB — CBC
HCT: 24.6 % — ABNORMAL LOW (ref 36.0–46.0)
Hemoglobin: 8 g/dL — ABNORMAL LOW (ref 12.0–15.0)
MCH: 26.6 pg (ref 26.0–34.0)
MCHC: 32.5 g/dL (ref 30.0–36.0)
MCV: 81.7 fL (ref 80.0–100.0)
NRBC: 0 % (ref 0.0–0.2)
PLATELETS: 61 10*3/uL — AB (ref 150–400)
RBC: 3.01 MIL/uL — ABNORMAL LOW (ref 3.87–5.11)
RDW: 16.8 % — AB (ref 11.5–15.5)
WBC: 7.1 10*3/uL (ref 4.0–10.5)

## 2018-07-22 LAB — BLOOD CULTURE ID PANEL (REFLEXED)

## 2018-07-22 LAB — COMPREHENSIVE METABOLIC PANEL WITH GFR
AST: 19 U/L (ref 15–41)
Albumin: 2.3 g/dL — ABNORMAL LOW (ref 3.5–5.0)
BUN: 46 mg/dL — ABNORMAL HIGH (ref 8–23)
Chloride: 101 mmol/L (ref 98–111)
Glucose, Bld: 130 mg/dL — ABNORMAL HIGH (ref 70–99)
Potassium: 3.6 mmol/L (ref 3.5–5.1)
Sodium: 131 mmol/L — ABNORMAL LOW (ref 135–145)

## 2018-07-22 LAB — PROCALCITONIN: Procalcitonin: 13.47 ng/mL

## 2018-07-22 LAB — MAGNESIUM: Magnesium: 1.7 mg/dL (ref 1.7–2.4)

## 2018-07-22 LAB — HEPARIN LEVEL (UNFRACTIONATED)
Heparin Unfractionated: 0.46 IU/mL (ref 0.30–0.70)
Heparin Unfractionated: 0.35 [IU]/mL (ref 0.30–0.70)
Heparin Unfractionated: 0.29 IU/mL — ABNORMAL LOW (ref 0.30–0.70)

## 2018-07-22 SURGERY — CYSTOSCOPY, WITH STENT INSERTION
Anesthesia: General | Site: Ureter | Laterality: Bilateral

## 2018-07-22 MED ORDER — ONDANSETRON HCL 4 MG/2ML IJ SOLN
INTRAMUSCULAR | Status: DC | PRN
  Administered 2018-07-22: 4 mg via INTRAVENOUS

## 2018-07-22 MED ORDER — FENTANYL CITRATE (PF) 100 MCG/2ML IJ SOLN: 25.0000 ug | INTRAMUSCULAR | Status: DC | PRN

## 2018-07-22 MED ORDER — SODIUM CHLORIDE 0.9 % IR SOLN
Status: DC | PRN
  Administered 2018-07-22: 3000 mL

## 2018-07-22 MED ORDER — PROPOFOL 10 MG/ML IV BOLUS
INTRAVENOUS | Status: AC
  Filled 2018-07-22: qty 20

## 2018-07-22 MED ORDER — LACTATED RINGERS IV SOLN
Freq: Once | INTRAVENOUS | Status: AC
  Administered 2018-07-22: 15:00:00 via INTRAVENOUS

## 2018-07-22 MED ORDER — LIDOCAINE 2% (20 MG/ML) 5 ML SYRINGE
INTRAMUSCULAR | Status: DC | PRN
  Administered 2018-07-22: 60 mg via INTRAVENOUS

## 2018-07-22 MED ORDER — FENTANYL CITRATE (PF) 100 MCG/2ML IJ SOLN
INTRAMUSCULAR | Status: DC | PRN
  Administered 2018-07-22: 50 ug via INTRAVENOUS

## 2018-07-22 MED ORDER — PROPOFOL 10 MG/ML IV BOLUS
INTRAVENOUS | Status: DC | PRN
  Administered 2018-07-22: 100 mg via INTRAVENOUS

## 2018-07-22 MED ORDER — LACTATED RINGERS IV SOLN
INTRAVENOUS | Status: DC | PRN
  Administered 2018-07-22: 16:00:00 via INTRAVENOUS

## 2018-07-22 MED ORDER — FENTANYL CITRATE (PF) 100 MCG/2ML IJ SOLN
INTRAMUSCULAR | Status: AC
  Filled 2018-07-22: qty 2

## 2018-07-22 MED ORDER — SODIUM CHLORIDE 0.9 % IV SOLN
2.0000 g | INTRAVENOUS | Status: DC
  Administered 2018-07-22 – 2018-07-25 (×4): 2 g via INTRAVENOUS
  Filled 2018-07-22 (×4): qty 2

## 2018-07-22 MED ORDER — IOPAMIDOL (ISOVUE-300) INJECTION 61%
INTRAVENOUS | Status: DC | PRN
  Administered 2018-07-22: 20 mL via URETHRAL

## 2018-07-22 SURGICAL SUPPLY — 11 items
BAG URO CATCHER STRL LF (MISCELLANEOUS) ×3 IMPLANT
CLOTH BEACON ORANGE TIMEOUT ST (SAFETY) ×3 IMPLANT
COVER WAND RF STERILE (DRAPES) IMPLANT
GLOVE BIOGEL M STRL SZ7.5 (GLOVE) ×3 IMPLANT
GOWN STRL REUS W/TWL LRG LVL3 (GOWN DISPOSABLE) ×6 IMPLANT
GUIDEWIRE STR DUAL SENSOR (WIRE) ×3 IMPLANT
MANIFOLD NEPTUNE II (INSTRUMENTS) ×3 IMPLANT
PACK CYSTO (CUSTOM PROCEDURE TRAY) ×3 IMPLANT
STENT URET 6FRX24 CONTOUR (STENTS) ×2 IMPLANT
TUBING CONNECTING 10 (TUBING) ×2 IMPLANT
TUBING CONNECTING 10' (TUBING) ×1

## 2018-07-22 NOTE — Progress Notes (Signed)
PHARMACY - PHYSICIAN COMMUNICATION CRITICAL VALUE ALERT - BLOOD CULTURE IDENTIFICATION (BCID)  Kayla Guerrero is an 83 y.o. female who presented to [facility] on 07/20/2018 with a chief complaint of N/V  Assessment:  Patient with 1 of 4 bottles + for E. Coli. (include suspected source if known)  Name of physician (or Provider) Contacted: Raliegh Ip Schorr  Current antibiotics: Cefepime and Vancomycin  Changes to prescribed antibiotics recommended:  Recommendations accepted by provider  Continue with current abx and await final culture results, since only 1 of 4 bottles.  Results for orders placed or performed during the hospital encounter of 07/20/18  Blood Culture ID Panel (Reflexed) (Collected: 07/21/2018  8:13 AM)  Result Value Ref Range   Enterococcus species NOT DETECTED NOT DETECTED   Listeria monocytogenes NOT DETECTED NOT DETECTED   Staphylococcus species NOT DETECTED NOT DETECTED   Staphylococcus aureus (BCID) NOT DETECTED NOT DETECTED   Streptococcus species NOT DETECTED NOT DETECTED   Streptococcus agalactiae NOT DETECTED NOT DETECTED   Streptococcus pneumoniae NOT DETECTED NOT DETECTED   Streptococcus pyogenes NOT DETECTED NOT DETECTED   Acinetobacter baumannii NOT DETECTED NOT DETECTED   Enterobacteriaceae species DETECTED (A) NOT DETECTED   Enterobacter cloacae complex NOT DETECTED NOT DETECTED   Escherichia coli DETECTED (A) NOT DETECTED   Klebsiella oxytoca NOT DETECTED NOT DETECTED   Klebsiella pneumoniae NOT DETECTED NOT DETECTED   Proteus species NOT DETECTED NOT DETECTED   Serratia marcescens NOT DETECTED NOT DETECTED   Carbapenem resistance NOT DETECTED NOT DETECTED   Haemophilus influenzae NOT DETECTED NOT DETECTED   Neisseria meningitidis NOT DETECTED NOT DETECTED   Pseudomonas aeruginosa NOT DETECTED NOT DETECTED   Candida albicans NOT DETECTED NOT DETECTED   Candida glabrata NOT DETECTED NOT DETECTED   Candida krusei NOT DETECTED NOT DETECTED   Candida  parapsilosis NOT DETECTED NOT DETECTED   Candida tropicalis NOT DETECTED NOT DETECTED    Nani Skillern Crowford 07/22/2018  5:47 AM

## 2018-07-22 NOTE — Progress Notes (Signed)
[place] for heparin Indication: Bilateral PE & DVT  Allergies  Allergen Reactions  . Levofloxacin Other (See Comments)    Unknown  . Promethazine Hcl Other (See Comments)    fainting  . Sulfa Antibiotics Swelling  . Sulfa Drugs Cross Reactors Swelling  . Sulfur Swelling   Patient Measurements: Height: 5\' 6"  (167.6 cm) Weight: 180 lb (81.6 kg) IBW/kg (Calculated) : 59.3 Heparin Dosing Weight: 76.4  Vital Signs: Temp: 98.1 F (36.7 C) (10/24 2015) Temp Source: Oral (10/24 2015) BP: 150/75 (10/24 2015) Pulse Rate: 80 (10/24 2015)  Labs: Recent Labs    07/20/18 0915 07/21/18 0346  07/22/18 0054 07/22/18 1112 07/22/18 1955  HGB 8.7* 8.1*  --  8.0*  --   --   HCT 27.0* 25.2*  --  24.6*  --   --   PLT 82* 66*  --  61*  --   --   HEPARINUNFRC  --   --    < > 0.46 0.35 0.29*  CREATININE 2.66* 2.67*  --  2.42*  --   --    < > = values in this interval not displayed.   Estimated Creatinine Clearance: 19.6 mL/min (A) (by C-G formula based on SCr of 2.42 mg/dL (H)).  Medical History: Past Medical History:  Diagnosis Date  . Aortic atherosclerosis ([place])   . Arthritis   . Arthritis of knee, left   . Borderline osteopenia DEXA 2006 and 2012  . Cancer [facility]    endometrial  . Cataract   . Cholesterol serum elevated   . Colon polyps   . DDD (degenerative disc disease), lumbar   . DDD (degenerative disc disease), lumbar   . Diverticulitis of sigmoid colon    Moderate  . Facet arthropathy, lumbar   . FHx: colon cancer   . GERD (gastroesophageal reflux disease)   . History of hiatal hernia    Moderate  . History of radiation therapy 08/06/16 - 09/03/16   vaginal cuff treated to 30 Gy in 5 fractions  . Hypothyroidism   . Kidney lesion, native, right   . Left foot drop   . Leukopenia   . Neuropathy    chemo indiced ; in right hand and bilateral feet   . Obesity   . Pelvic abscess in female 01/2017   Right  . Spondylisthesis    . Umbilical hernia    Assessment: 34 yoF with PMH of metastatic endometrial cancer with bilateral PE and B DVT diagnosed on 07/14/18.  Pt in ED 10/22 with N/V and AKI.  Pharmacy consulted to dose heparin due to AKI and thrombocytopenia.  She was on LMWH 80 mg sq q12 hours with last dose this morning at 08 am. Her SCr was 1.13 on 10/19 and 2.66 today with CrCl 18 ml/min.  The LMWH will be in her system at least 24 hours.  Her heparin levels were therapeutic 10/17 and 10/18 on heparin drip at 1000 units/hr.  Hg low at 8.7 (9.4 on 10/19) and PLTC low at 82.  No overt bleeding reported. Heparin at 1000 units/hr, no bolus, begin 10/23 at 8am. (24 hours after last LMWH 80 mg dose) No bolus if Heparin level low  Today, 07/22/2018  Heparin level now subtherapeutic on current IV heparin rate of 1000 units/hr  Per RN, no issues or bleeding noted  Goal of Therapy:  Heparin level 0.3-0.7 units/ml Monitor platelets by anticoagulation protocol: Yes  Would d/c heparin drip if platelets fall  below 30K per MD notes   Plan:  1) Increase IV heparin from 1000 units/hr to 1150 units/hr 2) Recheck heparin level 8 hours after rate increase   Adrian Saran, PharmD, BCPS Pager [phone] 07/22/2018 8:35 PM

## 2018-07-22 NOTE — Anesthesia Procedure Notes (Signed)
Procedure Name: LMA Insertion Performed by: Odai Wimmer J, CRNA Pre-anesthesia Checklist: Patient identified, Emergency Drugs available, Suction available, Patient being monitored and Timeout performed Patient Re-evaluated:Patient Re-evaluated prior to induction Oxygen Delivery Method: Circle system utilized Preoxygenation: Pre-oxygenation with 100% oxygen Induction Type: IV induction Ventilation: Mask ventilation without difficulty LMA: LMA inserted LMA Size: 4.0 Number of attempts: 1 Placement Confirmation: positive ETCO2 and breath sounds checked- equal and bilateral Tube secured with: Tape Dental Injury: Teeth and Oropharynx as per pre-operative assessment        

## 2018-07-22 NOTE — Op Note (Signed)
Preoperative diagnosis:  1. Bilateral hydronephrosis 2. AKI 3. UTI   Postoperative diagnosis:  1. Bilateral hydronephrosis 2. AKI 3. UTI   Procedure:  1. Cystoscopy 2. Left ureteral stent placement (6 x 24) - no string 3. Bilateral retrograde pyelography with interpretation  Surgeon: Pryor Curia. M.D.  Anesthesia: General  Complications: None  Intraoperative findings: Retrograde pyelography demonstrated severe obstruction of the right distal ureter.  No contrast was able to bypass the distal ureter.  On the left side, there were areas of the dilation and narrowing consistent with obstruction but along the ureter at multiple points and with moderate left hydronephrosis.  EBL: Minimal  Specimens: None  Indication: Kayla Guerrero is a 83 y.o. patient with metastatic endometrial cancer.  She recently was noted to have bilateral hydronephrosis with developing renal failure and a febrile urinary tract infection. After reviewing the management options for treatment, she elected to proceed with the above surgical procedure(s). We have discussed the potential benefits and risks of the procedure, side effects of the proposed treatment, the likelihood of the patient achieving the goals of the procedure, and any potential problems that might occur during the procedure or recuperation. Informed consent has been obtained.  Description of procedure:  The patient was taken to the operating room and general anesthesia was induced.  The patient was placed in the dorsal lithotomy position, prepped and draped in the usual sterile fashion, and preoperative antibiotics were administered. A preoperative time-out was performed.   Cystourethroscopy was performed.  The patient's urethra was examined and was normal. The bladder was then systematically examined in its entirety. There was no evidence for any bladder tumors, stones, or other mucosal pathology.    I intubated the right ureteral  orifice with a 6 French ureteral catheter.  Omnipaque contrast was injected and no contrast was able to bypass the distal ureter consistent with complete obstruction.  I attempted to pass a sensor guidewire into the ureter and this met immediate resistance within a few centimeters of the ureteral orifice.  It was clear that retrograde stenting would not be possible.  Attention then turned to the left ureteral orifice and a ureteral catheter was used to intubate the ureteral orifice.  Omnipaque contrast was injected through the ureteral catheter and a retrograde pyelogram was performed with findings as dictated above.  A 0.38 sensor guidewire was then advanced up the left ureter into the renal pelvis under fluoroscopic guidance.  The wire was then backloaded through the cystoscope and a ureteral stent was advance over the wire using Seldinger technique.  The stent was positioned appropriately under fluoroscopic and cystoscopic guidance.  The wire was then removed with an adequate stent curl noted in the renal pelvis as well as in the bladder.  The bladder was then emptied and the procedure ended.  The patient appeared to tolerate the procedure well and without complications.  The patient was able to be awakened and transferred to the recovery unit in satisfactory condition.    Pryor Curia MD

## 2018-07-22 NOTE — Progress Notes (Signed)
Patient ID: Kayla Guerrero, female   DOB: [birth year], 83 y.o.   MRN: [id number]    Subjective: No fever overnight.  No change in pain symptoms. Still minimal to no flank pain.  Objective: Vital signs in last 24 hours: Temp:  [98 F (36.7 C)-98.5 F (36.9 C)] 98.3 F (36.8 C) (10/24 0400) Pulse Rate:  [73-84] 73 (10/24 0400) Resp:  [18] 18 (10/24 0400) BP: (133-175)/(62-80) 167/78 (10/24 0400) SpO2:  [92 %-94 %] 92 % (10/24 0400)  Intake/Output from previous day: 10/23 0701 - 10/24 0700 In: 5342.1 [P.O.:2398; I.V.:2344.1; IV Piggyback:600] Out: 350 [Urine:350] Intake/Output this shift: Total I/O In: 1020 [I.V.:1020] Out: -   Physical Exam:  General: Alert and oriented Abd: No CVAT  Lab Results: Recent Labs    07/20/18 0915 07/21/18 0346 07/22/18 0054  HGB 8.7* 8.1* 8.0*  HCT 27.0* 25.2* 24.6*   CBC Latest Ref Rng & Units 07/22/2018 07/21/2018 07/20/2018  WBC 4.0 - 10.5 K/uL 7.1 6.4 8.2  Hemoglobin 12.0 - 15.0 g/dL 8.0(L) 8.1(L) 8.7(L)  Hematocrit 36.0 - 46.0 % 24.6(L) 25.2(L) 27.0(L)  Platelets 150 - 400 K/uL 61(L) 66(L) 82(L)     BMET Recent Labs    07/21/18 0346 07/22/18 0054  NA 130* 131*  K 3.7 3.6  CL 97* 101  CO2 21* 20*  GLUCOSE 113* 130*  BUN 45* 46*  CREATININE 2.67* 2.42*  CALCIUM 6.7* 7.2*     Studies/Results: Ct Abdomen Pelvis Wo Contrast  Result Date: 07/21/2018 CLINICAL DATA:  Endometrial carcinoma.  Lung mass. EXAM: CT ABDOMEN AND PELVIS WITHOUT CONTRAST TECHNIQUE: Multidetector CT imaging of the abdomen and pelvis was performed following the standard protocol without IV contrast. COMPARISON:  CTA 07/14/2018, CT 06/11/2018 FINDINGS: Lower chest: Large RIGHT lower lobe mass again demonstrated measuring 8.8 cm compared to 7.8 cm on recent CT. There is central gas within lesions suggesting necrotic tumor. Hepatobiliary: No focal hepatic lesion on noncontrast exam. Postcholecystectomy. Pancreas: Pancreas is normal. No ductal dilatation. No  pancreatic inflammation. Spleen: Normal spleen Adrenals/urinary tract: Adrenal glands normal. There is severe hydronephrosis and hydroureter on the RIGHT. Hydroureter extends to the distal ureter where there is a mass along the RIGHT lateral wall of the bladder measuring 3.9 by 2.5 cm (image 69/2) which compares to 5.4 by 3.3 cm for potential decrease in size. There is mild hydronephrosis hydroureter ofthe LEFT collecting system without obstructing lesion identified. The hydroureter extends to the mid ureter and is increased from prior. Bladder normal Stomach/Bowel: Hiatal hernia. Stomach, duodenum small-bowel normal. No evidence of bowel obstruction. Appendix is normal. Colon rectosigmoid colon are normal Vascular/Lymphatic: Abdominal aortic normal caliber. There is intimal calcification. No retroperitoneal adenopathy. No pelvic adenopathy Reproductive: Post hysterectomy. Other: There is thickening within the ventral mesentery superior to the transverse colon (lesser omentum) measuring 7.5 by 1.9. This finding is similar to comparison exam. Small peritoneal implants along thesigmoid colon (image 57/2) also unchanged. Musculoskeletal: No aggressive osseous lesion IMPRESSION: 1. RIGHT lower lobe necrotic mass slightly increased in size from comparison exam is concerning for necrotic neoplasm / metastasis. 2. Severe hydronephrosis and hydroureter of the RIGHT collecting system secondary to obstructing at RIGHT pelvic sidewall. No significant change hydronephrosis. 3. No significant change in RIGHT pelvic sidewall mass. 4. New mild hydronephrosis hydroureter on the LEFT. No clear obstructing lesion identified. 5. Stable peritoneal and omental metastasis. Electronically Signed   By: Suzy Bouchard M.D.   On: 07/21/2018 12:49   Dg Chest 2 View  Result Date: 07/20/2018  CLINICAL DATA:  History metastatic endometrial carcinoma. Weakness today. EXAM: CHEST - 2 VIEW COMPARISON:  CT chest and PA and lateral chest  07/14/2018. FINDINGS: Scattered pulmonary nodules and a large mass in the right lower lobe appear unchanged. No consolidative process, pneumothorax or effusion. Heart size is normal. Aortic atherosclerosis is noted. Port-A-Cath is in place. No acute or focal bony abnormality. IMPRESSION: No acute disease in a patient with endometrial carcinoma metastatic to the lungs. Atherosclerosis. Electronically Signed   By: Inge Rise M.D.   On: 07/20/2018 10:16    Assessment/Plan: 1) Bilateral hydronephrosis in setting of AKI and UTI with recent fever:  Although renal function is marginally improved, overall still significantly elevated over baseline.  With UTI and recent fever, I do think an attempt at bilateral ureteral stent placement would be in her best interest and she is in agreement.  Will plan for cystoscopy and bilateral ureteral stent placement today.  She may require PCN if stents are not able to be placed in retrograde fashion.  For procedure today, she can take meds with sips but will be made NPO and she can continue anticoagulation as procedure has low risk of bleeding.   LOS: 0 days   Makyra Corprew,LES 07/22/2018, 6:32 AM

## 2018-07-22 NOTE — Progress Notes (Signed)
[place] for heparin Indication: Bilateral PE & DVT  Allergies  Allergen Reactions  . Levofloxacin Other (See Comments)    Unknown  . Promethazine Hcl Other (See Comments)    fainting  . Sulfa Antibiotics Swelling  . Sulfa Drugs Cross Reactors Swelling  . Sulfur Swelling   Patient Measurements:   Heparin Dosing Weight: 76.4  Vital Signs: Temp: 97.8 F (36.6 C) (10/24 0839) Temp Source: Oral (10/24 0839) BP: 168/82 (10/24 0839) Pulse Rate: 73 (10/24 0839)  Labs: Recent Labs    07/20/18 0915 07/21/18 0346 07/21/18 1640 07/22/18 0054 07/22/18 1112  HGB 8.7* 8.1*  --  8.0*  --   HCT 27.0* 25.2*  --  24.6*  --   PLT 82* 66*  --  61*  --   HEPARINUNFRC  --   --  0.62 0.46 0.35  CREATININE 2.66* 2.67*  --  2.42*  --    Estimated Creatinine Clearance: 19.6 mL/min (A) (by C-G formula based on SCr of 2.42 mg/dL (H)).  Medical History: Past Medical History:  Diagnosis Date  . Aortic atherosclerosis ([place])   . Arthritis   . Arthritis of knee, left   . Borderline osteopenia DEXA 2006 and 2012  . Cancer [facility])    endometrial  . Cataract   . Cholesterol serum elevated   . Colon polyps   . DDD (degenerative disc disease), lumbar   . DDD (degenerative disc disease), lumbar   . Diverticulitis of sigmoid colon    Moderate  . Facet arthropathy, lumbar   . FHx: colon cancer   . GERD (gastroesophageal reflux disease)   . History of hiatal hernia    Moderate  . History of radiation therapy 08/06/16 - 09/03/16   vaginal cuff treated to 30 Gy in 5 fractions  . Hypothyroidism   . Kidney lesion, native, right   . Left foot drop   . Leukopenia   . Neuropathy    chemo indiced ; in right hand and bilateral feet   . Obesity   . Pelvic abscess in female 01/2017   Right  . Spondylisthesis   . Umbilical hernia    Assessment: 32 yoF with PMH of metastatic endometrial cancer with bilateral PE and B DVT diagnosed on 07/14/18.  Pt in ED 10/22  with N/V and AKI.  Pharmacy consulted to dose heparin due to AKI and thrombocytopenia.  She was on LMWH 80 mg sq q12 hours with last dose this morning at 08 am. Her SCr was 1.13 on 10/19 and 2.66 today with CrCl 18 ml/min.  The LMWH will be in her system at least 24 hours.  Her heparin levels were therapeutic 10/17 and 10/18 on heparin drip at 1000 units/hr.  Hg low at 8.7 (9.4 on 10/19) and PLTC low at 82.  No overt bleeding reported.    Heparin at 1000 units/hr, no bolus, begin 10/23 at 8am. (24 hours after last LMWH 80 mg dose)  No bolus if Heparin level low  Today, 07/22/2018  Heparin levels 0.62 > 0.46 on 1000 units/hr  3rd Hep level this am = 0.35 units/ml  Goal of Therapy:  Heparin level 0.3-0.7 units/ml Monitor platelets by anticoagulation protocol: Yes  Would d/c heparin drip if platelets fall below 30K per MD notes   Plan:   Per Dr. Alinda Money- no need to stop IV heparin for Cysto/stent placement today  Continue Heparin at 1000 units/hr   Recheck Hep level 6-8 hr after procedure, schedule  after procedure  Minda Ditto PharmD Pager [phone] 07/22/2018, 12:39 PM

## 2018-07-22 NOTE — Progress Notes (Signed)
ANTICOAGULATION CONSULT NOTE - Follow Up Consult  Pharmacy Consult for Heparin Indication: pulmonary embolus and DVT  Allergies  Allergen Reactions  . Levofloxacin Other (See Comments)    Unknown  . Promethazine Hcl Other (See Comments)    fainting  . Sulfa Antibiotics Swelling  . Sulfa Drugs Cross Reactors Swelling  . Sulfur Swelling    Patient Measurements:   Heparin Dosing Weight:   Vital Signs: Temp: 98.3 F (36.8 C) (10/24 0400) Temp Source: Oral (10/24 0400) BP: 167/78 (10/24 0400) Pulse Rate: 73 (10/24 0400)  Labs: Recent Labs    07/20/18 0915 07/21/18 0346 07/21/18 1640 07/22/18 0054  HGB 8.7* 8.1*  --  8.0*  HCT 27.0* 25.2*  --  24.6*  PLT 82* 66*  --  61*  HEPARINUNFRC  --   --  0.62 0.46  CREATININE 2.66* 2.67*  --  2.42*    Estimated Creatinine Clearance: 19.6 mL/min (A) (by C-G formula based on SCr of 2.42 mg/dL (H)).   Medications:  Infusions:  . sodium chloride 100 mL/hr at 07/21/18 1855  . ceFEPime (MAXIPIME) IV 2 g (07/21/18 0840)  . heparin 1,000 Units/hr (07/21/18 1855)  . [START ON 07/23/2018] vancomycin      Assessment: Patient with heparin level at goal again.  No heparin issues noted.  Goal of Therapy:  Heparin level 0.3-0.7 units/ml Monitor platelets by anticoagulation protocol: Yes   Plan:  Continue heparin drip at current rate Recheck level at [place], [place] 07/22/2018,5:34 AM

## 2018-07-22 NOTE — Transfer of Care (Signed)
Immediate Anesthesia Transfer of Care Note  Patient: Kayla Guerrero  Procedure(s) Performed: BILATERAL RETROGRADE CYSTOSCOPY WITH STENT PLACEMENT- LEFT STENT PLACEMENT (Bilateral Ureter)  Patient Location: PACU  Anesthesia Type:General  Level of Consciousness: awake, alert  and oriented  Airway & Oxygen Therapy: Patient Spontanous Breathing and Patient connected to face mask oxygen  Post-op Assessment: Report given to RN and Post -op Vital signs reviewed and stable  Post vital signs: Reviewed and stable  Last Vitals:  Vitals Value Taken Time  BP 163/78 07/22/2018  4:53 PM  Temp    Pulse 76 07/22/2018  4:56 PM  Resp 11 07/22/2018  4:56 PM  SpO2 99 % 07/22/2018  4:56 PM  Vitals shown include unvalidated device data.  Last Pain:  Vitals:   07/22/18 1456  TempSrc:   PainSc: 0-No pain         Complications: No apparent anesthesia complications

## 2018-07-22 NOTE — Progress Notes (Signed)
Kayla Guerrero   DOB:[birth year]   NW#:[id number]    Assessment & Plan:   Metastatic uterine cancer CT showed stability/shrinkage of abdominal disease Even though the lung mass is bigger, with necrotic appearance, I felt that she might have positive response overall Continue supportive care  Acute renal failure Could be either due to third spacing, sepsis, worsening disease in her abdomen or recent exposure to contrast and hydronephrosis. Appreciate urology consult and recommendations. Agree with bilateral stent placement  Recent PE She will continue IV heparin for now  Acquired pancytopenia Likely due to side effects of recent chemotherapy, sepsis or side effects of antibiotic treatment We will monitor daily She might need transfusion support soon  Fever, resolved Cultures have been ordered but not drawn She will continue IV antibiotics for now  Moderate to severe protein calorie malnutrition Due to third spacing and poor oral intake She needs dietitian consult  Progressive weakness Due to recurrent hospitalization She may benefit from PT consult  Discharge planning She is too weak to be discharged I will return to check on her tomorrow   [name], MD 07/22/2018  7:17 AM   Subjective:  She felt better. Still mildly nauseated and felt excessively fatigued. The patient denies any recent signs or symptoms of bleeding such as spontaneous epistaxis, hematuria or hematochezia.  Objective:  Vitals:   07/21/18 2052 07/22/18 0400  BP: 133/62 (!) 167/78  Pulse: 84 73  Resp: 18 18  Temp: 98.5 F (36.9 C) 98.3 F (36.8 C)  SpO2: 93% 92%     Intake/Output Summary (Last 24 hours) at 07/22/2018 0717 Last data filed at 07/22/2018 0400 Gross per 24 hour  Intake 5342.11 ml  Output 350 ml  Net 4992.11 ml    GENERAL:alert, no distress and comfortable Musculoskeletal:no cyanosis of digits and no clubbing  NEURO: alert & oriented x 3 with fluent speech, no focal  motor/sensory deficits   Labs:  Lab Results  Component Value Date   WBC 7.1 07/22/2018   HGB 8.0 (L) 07/22/2018   HCT 24.6 (L) 07/22/2018   MCV 81.7 07/22/2018   PLT 61 (L) 07/22/2018   NEUTROABS 7.7 07/20/2018    Lab Results  Component Value Date   NA 131 (L) 07/22/2018   K 3.6 07/22/2018   CL 101 07/22/2018   CO2 20 (L) 07/22/2018    Studies: I have personally reviewed her scans Ct Abdomen Pelvis Wo Contrast  Result Date: 07/21/2018 CLINICAL DATA:  Endometrial carcinoma.  Lung mass. EXAM: CT ABDOMEN AND PELVIS WITHOUT CONTRAST TECHNIQUE: Multidetector CT imaging of the abdomen and pelvis was performed following the standard protocol without IV contrast. COMPARISON:  CTA 07/14/2018, CT 06/11/2018 FINDINGS: Lower chest: Large RIGHT lower lobe mass again demonstrated measuring 8.8 cm compared to 7.8 cm on recent CT. There is central gas within lesions suggesting necrotic tumor. Hepatobiliary: No focal hepatic lesion on noncontrast exam. Postcholecystectomy. Pancreas: Pancreas is normal. No ductal dilatation. No pancreatic inflammation. Spleen: Normal spleen Adrenals/urinary tract: Adrenal glands normal. There is severe hydronephrosis and hydroureter on the RIGHT. Hydroureter extends to the distal ureter where there is a mass along the RIGHT lateral wall of the bladder measuring 3.9 by 2.5 cm (image 69/2) which compares to 5.4 by 3.3 cm for potential decrease in size. There is mild hydronephrosis hydroureter ofthe LEFT collecting system without obstructing lesion identified. The hydroureter extends to the mid ureter and is increased from prior. Bladder normal Stomach/Bowel: Hiatal hernia. Stomach, duodenum small-bowel normal. No evidence of  bowel obstruction. Appendix is normal. Colon rectosigmoid colon are normal Vascular/Lymphatic: Abdominal aortic normal caliber. There is intimal calcification. No retroperitoneal adenopathy. No pelvic adenopathy Reproductive: Post hysterectomy. Other: There  is thickening within the ventral mesentery superior to the transverse colon (lesser omentum) measuring 7.5 by 1.9. This finding is similar to comparison exam. Small peritoneal implants along thesigmoid colon (image 57/2) also unchanged. Musculoskeletal: No aggressive osseous lesion IMPRESSION: 1. RIGHT lower lobe necrotic mass slightly increased in size from comparison exam is concerning for necrotic neoplasm / metastasis. 2. Severe hydronephrosis and hydroureter of the RIGHT collecting system secondary to obstructing at RIGHT pelvic sidewall. No significant change hydronephrosis. 3. No significant change in RIGHT pelvic sidewall mass. 4. New mild hydronephrosis hydroureter on the LEFT. No clear obstructing lesion identified. 5. Stable peritoneal and omental metastasis. Electronically Signed   By: Suzy Bouchard M.D.   On: 07/21/2018 12:49   Dg Chest 2 View  Result Date: 07/20/2018 CLINICAL DATA:  History metastatic endometrial carcinoma. Weakness today. EXAM: CHEST - 2 VIEW COMPARISON:  CT chest and PA and lateral chest 07/14/2018. FINDINGS: Scattered pulmonary nodules and a large mass in the right lower lobe appear unchanged. No consolidative process, pneumothorax or effusion. Heart size is normal. Aortic atherosclerosis is noted. Port-A-Cath is in place. No acute or focal bony abnormality. IMPRESSION: No acute disease in a patient with endometrial carcinoma metastatic to the lungs. Atherosclerosis. Electronically Signed   By: Inge Rise M.D.   On: 07/20/2018 10:16

## 2018-07-22 NOTE — Anesthesia Postprocedure Evaluation (Signed)
Anesthesia Post Note  Patient: Kayla Guerrero  Procedure(s) Performed: BILATERAL RETROGRADE CYSTOSCOPY WITH STENT PLACEMENT- LEFT STENT PLACEMENT (Bilateral Ureter)     Patient location during evaluation: PACU Anesthesia Type: General Level of consciousness: awake Pain management: pain level controlled Vital Signs Assessment: post-procedure vital signs reviewed and stable Respiratory status: spontaneous breathing Cardiovascular status: stable Postop Assessment: no apparent nausea or vomiting Anesthetic complications: no    Last Vitals:  Vitals:   07/22/18 1715 07/22/18 1730  BP: (!) 167/84 (!) 179/84  Pulse: 79 79  Resp: 14 13  Temp: 36.9 C 36.7 C  SpO2: 93% 96%    Last Pain:  Vitals:   07/22/18 1715  TempSrc:   PainSc: 0-No pain                 Geralda Baumgardner

## 2018-07-22 NOTE — Progress Notes (Signed)
PROGRESS NOTE    Kayla Guerrero  LKG:[id number] DOB: September 21, 1935 DOA: 07/20/2018 PCP: Rita Ohara, MD   Brief Narrative:  83 year old with past medical history relevant for hypertension, hypothyroidism, widely metastatic endometrial carcinoma with disease to the lungs, abdomen with recent chemotherapy on 07/12/2018, stage III CKD, recent diagnosis of submassive PE and bilateral DVT on enoxaparin who was admitted on 07/20/2018 with profound weakness, intractable nausea, vomiting and found to have E. coli sepsis likely due to post renal obstruction from malignant source.   Assessment & Plan:   Active Problems:   Chemotherapy induced nausea and vomiting   Anemia   Hyponatremia   Hypothyroidism   PE (pulmonary thromboembolism) (HCC)   Leg DVT (deep venous thromboembolism), acute, bilateral (HCC)   AKI (acute kidney injury) ([place])   #) E. coli bacteremia: Likely source is urine due to right-sided known chronic severe hydronephrosis as well as now developing left-sided hydronephrosis. - blood cultures on 07/21/2018 grew out E. coli - IV cefepime and vancomycin 07/21/2018 discontinue -Start IV ceftriaxone 2 g daily 07/22/2018   #) AKI on stage III CKD: Combination of postrenal due to obstruction from known cancer on the right as well as now possible left.  CT abdomen pelvis showed known chronic right-sided hydronephrosis as well as now new developing left-sided hydronephrosis.  This explains her AKI as well as her sepsis.  Urology has been consulted and is recommending ureteral stenting versus percutaneous nephrostomy tubes. -IV fluids -Avoid nephrotoxins -Urology following, appreciate recommendations, plan for ureteral stents today if this cannot be performed then will discuss with interventional radiology about PCN tube placement  #) Widely metastatic endometrial carcinoma: Patient is status post 2 cycles of chemotherapy.  -Continue hydromorphone for pain control -Continue dexamethasone  for nausea  -oncology following appreciate recommendations, Dr. Simeon Craft such  #) DVT/submassive PE: Patient was recently diagnosed with this.  Likely provoked in the setting of malignancy. -Hold enoxaparin 1 mg/kg -Heparin infusion  #) Anemia/thrombus cytopenia: Likely due to recent chemotherapy. -Supportive care, will discontinue heparin drip if platelets fall below 50  #) Hypothyroidism: -Continue levothyroxine 88 mcg  #) Pain/psych: -Continue duloxetine 30 mg nightly  #) Hypertension: -Hold amlodipine 10 mg  Fluids: IV fluids Electrodes: Monitor and supplement Nutrition: Regular diet  Prophylaxis: Heparin drip  Disposition: Antibiotic course and decompression of urinary system  Full code    Consultants:   Oncology, Dr. Lottie Rater  Urology  Procedures:  07/22/2018 pending placement of ureteral stents  Antimicrobials:   IV cefepime and vancomycin 07/21/2018 to 07/22/2018  IV ceftriaxone 2 g started 07/22/2018   Subjective: Patient reports that she is feeling better but has some vague diffuse abdominal pain that is relatively unchanged.  She is worried about the procedure but eager to get it done.  She denies any nausea, vomiting, diarrhea, cough, congestion.  Objective: Vitals:   07/21/18 1410 07/21/18 2052 07/22/18 0400 07/22/18 0839  BP: (!) 154/78 133/62 (!) 167/78 (!) 168/82  Pulse: 82 84 73 73  Resp: 18 18 18 16   Temp:  98.5 F (36.9 C) 98.3 F (36.8 C) 97.8 F (36.6 C)  TempSrc:  Oral Oral Oral  SpO2: 94% 93% 92% 98%    Intake/Output Summary (Last 24 hours) at 07/22/2018 1032 Last data filed at 07/22/2018 0400 Gross per 24 hour  Intake 4502.11 ml  Output 50 ml  Net 4452.11 ml   There were no vitals filed for this visit.  Examination:  General exam: Appears calm and comfortable  Respiratory system:  Clear to auscultation. Respiratory effort normal.  Diminished lung sounds at bases Cardiovascular system: Regular rate and rhythm, no  murmurs Gastrointestinal system: Soft, mildly tender to deep palpation, no rebound or guarding, plus bowel sounds Central nervous system: Alert and oriented. No focal neurological deficits. Extremities: 1+ lower extremity edema Skin: Port site is clean dry and intact Psychiatry: Judgement and insight appear normal. Mood & affect appropriate.     Data Reviewed: I have personally reviewed following labs and imaging studies  CBC: Recent Labs  Lab 07/16/18 0806 07/17/18 0419 07/20/18 0915 07/21/18 0346 07/22/18 0054  WBC 3.9* 4.2 8.2 6.4 7.1  NEUTROABS  --   --  7.7  --   --   HGB 9.5* 9.4* 8.7* 8.1* 8.0*  HCT 30.2* 29.7* 27.0* 25.2* 24.6*  MCV 84.4 84.9 82.6 82.1 81.7  PLT 190 191 82* 66* 61*   Basic Metabolic Panel: Recent Labs  Lab 07/17/18 0419 07/20/18 0915 07/21/18 0346 07/22/18 0054  NA 135 131* 130* 131*  K 3.6 3.8 3.7 3.6  CL 100 96* 97* 101  CO2 28 24 21* 20*  GLUCOSE 87 122* 113* 130*  BUN 22 41* 45* 46*  CREATININE 1.13* 2.66* 2.67* 2.42*  CALCIUM 8.2* 7.2* 6.7* 7.2*  MG  --   --   --  1.7   GFR: Estimated Creatinine Clearance: 19.6 mL/min (A) (by C-G formula based on SCr of 2.42 mg/dL (H)). Liver Function Tests: Recent Labs  Lab 07/20/18 0915 07/21/18 0346 07/22/18 0054  AST 27 21 19   ALT 13 12 12   ALKPHOS 70 62 62  BILITOT 0.7 0.8 0.4  PROT 6.1* 5.5* 5.4*  ALBUMIN 2.8* 2.4* 2.3*   No results for input(s): LIPASE, AMYLASE in the last 168 hours. No results for input(s): AMMONIA in the last 168 hours. Coagulation Profile: No results for input(s): INR, PROTIME in the last 168 hours. Cardiac Enzymes: Recent Labs  Lab 07/15/18 1123  TROPONINI 0.39*   BNP (last 3 results) No results for input(s): PROBNP in the last 8760 hours. HbA1C: No results for input(s): HGBA1C in the last 72 hours. CBG: No results for input(s): GLUCAP in the last 168 hours. Lipid Profile: No results for input(s): CHOL, HDL, LDLCALC, TRIG, CHOLHDL, LDLDIRECT in the  last 72 hours. Thyroid Function Tests: No results for input(s): TSH, T4TOTAL, FREET4, T3FREE, THYROIDAB in the last 72 hours. Anemia Panel: No results for input(s): VITAMINB12, FOLATE, FERRITIN, TIBC, IRON, RETICCTPCT in the last 72 hours. Sepsis Labs: Recent Labs  Lab 07/21/18 0813 07/22/18 0054  PROCALCITON 15.85 13.47    Recent Results (from the past 240 hour(s))  Urine culture     Status: None   Collection Time: 07/14/18  8:04 PM  Result Value Ref Range Status   Specimen Description   Final    URINE, CLEAN CATCH Performed at [facility], [place] [address] [place], [place] [address]    Special Requests   Final    Normal Performed at [facility], [place] [address] [place], [place] [address]    Culture   Final    NO GROWTH Performed at [facility], [place] [address] [place], [place] [address]    Report Status 07/16/2018 FINAL  Final  MRSA PCR Screening     Status: None   Collection Time: 07/15/18  3:00 AM  Result Value Ref Range Status   MRSA by PCR NEGATIVE NEGATIVE Final    Comment:  The GeneXpert MRSA Assay (FDA approved for NASAL specimens only), is one component of a comprehensive MRSA colonization surveillance program. It is not intended to diagnose MRSA infection nor to guide or monitor treatment for MRSA infections. Performed at [facility], [place] [address], [place], [place] [address]   Culture, blood (routine x 2)     Status: Abnormal (Preliminary result)   Collection Time: 07/21/18  8:13 AM  Result Value Ref Range Status   Specimen Description   Final    BLOOD RIGHT ARM Performed at [place] [address] [place], [place] [address]    Special Requests   Final    BOTTLES DRAWN AEROBIC AND ANAEROBIC Blood Culture adequate volume Performed at [place] [address], [place], [place] [address]    Culture  Setup Time   Final    GRAM  NEGATIVE RODS AEROBIC BOTTLE ONLY CRITICAL RESULT CALLED TO, READ BACK BY AND VERIFIED WITH: Lavell Luster [facility] 07/22/18 0026 JDW    Culture (A)  Final    ESCHERICHIA COLI SUSCEPTIBILITIES TO FOLLOW Performed at [facility], [place] [address], [place], [place] [address]    Report Status PENDING  Incomplete  Blood Culture ID Panel (Reflexed)     Status: Abnormal   Collection Time: 07/21/18  8:13 AM  Result Value Ref Range Status   Enterococcus species NOT DETECTED NOT DETECTED Final   Listeria monocytogenes NOT DETECTED NOT DETECTED Final   Staphylococcus species NOT DETECTED NOT DETECTED Final   Staphylococcus aureus (BCID) NOT DETECTED NOT DETECTED Final   Streptococcus species NOT DETECTED NOT DETECTED Final   Streptococcus agalactiae NOT DETECTED NOT DETECTED Final   Streptococcus pneumoniae NOT DETECTED NOT DETECTED Final   Streptococcus pyogenes NOT DETECTED NOT DETECTED Final   Acinetobacter baumannii NOT DETECTED NOT DETECTED Final   Enterobacteriaceae species DETECTED (A) NOT DETECTED Final    Comment: Enterobacteriaceae represent a large family of gram-negative bacteria, not a single organism. CRITICAL RESULT CALLED TO, READ BACK BY AND VERIFIED WITH: J GRIMSLEY PHARMD 07/22/18 0026 JDW    Enterobacter cloacae complex NOT DETECTED NOT DETECTED Final   Escherichia coli DETECTED (A) NOT DETECTED Final    Comment: CRITICAL RESULT CALLED TO, READ BACK BY AND VERIFIED WITH: J GRIMSLEY PHARMD  07/22/18 0026 JDW    Klebsiella oxytoca NOT DETECTED NOT DETECTED Final   Klebsiella pneumoniae NOT DETECTED NOT DETECTED Final   Proteus species NOT DETECTED NOT DETECTED Final   Serratia marcescens NOT DETECTED NOT DETECTED Final   Carbapenem resistance NOT DETECTED NOT DETECTED Final   Haemophilus influenzae NOT DETECTED NOT DETECTED Final   Neisseria meningitidis NOT DETECTED NOT DETECTED Final   Pseudomonas aeruginosa NOT DETECTED NOT DETECTED Final   Candida albicans NOT  DETECTED NOT DETECTED Final   Candida glabrata NOT DETECTED NOT DETECTED Final   Candida krusei NOT DETECTED NOT DETECTED Final   Candida parapsilosis NOT DETECTED NOT DETECTED Final   Candida tropicalis NOT DETECTED NOT DETECTED Final    Comment: Performed at [facility], [place] [address] [place], [place] [address]  Culture, blood (routine x 2)     Status: None (Preliminary result)   Collection Time: 07/21/18  8:20 AM  Result Value Ref Range Status   Specimen Description   Final    BLOOD RIGHT WRIST Performed at [place] [address], [place], [place] [address]    Special Requests   Final  BOTTLES DRAWN AEROBIC AND ANAEROBIC Blood Culture adequate volume Performed at [place] [address], [place], [place] [address]    Culture   Final    NO GROWTH 1 DAY Performed at [facility], [place] [address] [place], [place] [address]    Report Status PENDING  Incomplete  Urine culture     Status: Abnormal (Preliminary result)   Collection Time: 07/21/18  8:51 AM  Result Value Ref Range Status   Specimen Description   Final    URINE, CLEAN CATCH Performed at [facility], [place] [address], [place], [place] [address]    Special Requests   Final    NONE Performed at [facility], [place] [address] [place],  [address]    Culture >=100,000 COLONIES/mL GRAM NEGATIVE RODS (A)  Final   Report Status PENDING  Incomplete         Radiology Studies: Ct Abdomen Pelvis Wo Contrast  Result Date: 07/21/2018 CLINICAL DATA:  Endometrial carcinoma.  Lung mass. EXAM: CT ABDOMEN AND PELVIS WITHOUT CONTRAST TECHNIQUE: Multidetector CT imaging of the abdomen and pelvis was performed following the standard protocol without IV contrast. COMPARISON:  CTA 07/14/2018, CT 06/11/2018 FINDINGS: Lower chest: Large RIGHT lower lobe mass again demonstrated measuring 8.8 cm compared to 7.8 cm on recent CT. There  is central gas within lesions suggesting necrotic tumor. Hepatobiliary: No focal hepatic lesion on noncontrast exam. Postcholecystectomy. Pancreas: Pancreas is normal. No ductal dilatation. No pancreatic inflammation. Spleen: Normal spleen Adrenals/urinary tract: Adrenal glands normal. There is severe hydronephrosis and hydroureter on the RIGHT. Hydroureter extends to the distal ureter where there is a mass along the RIGHT lateral wall of the bladder measuring 3.9 by 2.5 cm (image 69/2) which compares to 5.4 by 3.3 cm for potential decrease in size. There is mild hydronephrosis hydroureter ofthe LEFT collecting system without obstructing lesion identified. The hydroureter extends to the mid ureter and is increased from prior. Bladder normal Stomach/Bowel: Hiatal hernia. Stomach, duodenum small-bowel normal. No evidence of bowel obstruction. Appendix is normal. Colon rectosigmoid colon are normal Vascular/Lymphatic: Abdominal aortic normal caliber. There is intimal calcification. No retroperitoneal adenopathy. No pelvic adenopathy Reproductive: Post hysterectomy. Other: There is thickening within the ventral mesentery superior to the transverse colon (lesser omentum) measuring 7.5 by 1.9. This finding is similar to comparison exam. Small peritoneal implants along thesigmoid colon (image 57/2) also unchanged. Musculoskeletal: No aggressive osseous lesion IMPRESSION: 1. RIGHT lower lobe necrotic mass slightly increased in size from comparison exam is concerning for necrotic neoplasm / metastasis. 2. Severe hydronephrosis and hydroureter of the RIGHT collecting system secondary to obstructing at RIGHT pelvic sidewall. No significant change hydronephrosis. 3. No significant change in RIGHT pelvic sidewall mass. 4. New mild hydronephrosis hydroureter on the LEFT. No clear obstructing lesion identified. 5. Stable peritoneal and omental metastasis. Electronically Signed   By: Suzy Bouchard M.D.   On: 07/21/2018 12:49         Scheduled Meds: . dexamethasone  4 mg Oral Daily  . dronabinol  2.5 mg Oral BID AC  . levothyroxine  88 mcg Oral Q0600  . Melatonin  5 mg Oral QHS  . metoCLOPramide  10 mg Oral TID AC  . sodium chloride flush  10-40 mL Intracatheter Q12H  . triamcinolone  1 spray Nasal Daily   Continuous Infusions: . sodium chloride 100 mL/hr at 07/21/18 1855  . cefTRIAXone (ROCEPHIN)  IV 2 g (07/22/18 0825)  . heparin 1,000 Units/hr (  07/22/18 0827)     LOS: 0 days    Time spent: 42    Cristy Folks, MD [facility]  If 7PM-7AM, please contact night-coverage [email or web address] Password [email or web address] 07/22/2018, 10:32 AM

## 2018-07-22 NOTE — Progress Notes (Signed)
PT Cancellation Note  Patient Details Name: SOTIRIA KEAST MRN: [id number] DOB: 1935/03/25   Cancelled Treatment:    Reason Eval/Treat Not Completed: Patient is scheduled for procedure today. If not performed , will check back.    Claretha Cooper 07/22/2018, 8:11 AM [place] Pager [phone] Office [phone]

## 2018-07-23 ENCOUNTER — Encounter (HOSPITAL_COMMUNITY): Payer: Self-pay | Admitting: Urology

## 2018-07-23 LAB — RENAL FUNCTION PANEL
Albumin: 2.2 g/dL — ABNORMAL LOW (ref 3.5–5.0)
Anion gap: 8 (ref 5–15)
BUN: 42 mg/dL — AB (ref 8–23)
CALCIUM: 7.6 mg/dL — AB (ref 8.9–10.3)
CO2: 22 mmol/L (ref 22–32)
CREATININE: 2.08 mg/dL — AB (ref 0.44–1.00)
Chloride: 109 mmol/L (ref 98–111)
GFR calc Af Amer: 25 mL/min — ABNORMAL LOW (ref >60)
GFR calc non Af Amer: 21 mL/min — ABNORMAL LOW (ref >60)
GLUCOSE: 98 mg/dL (ref 70–99)
Phosphorus: 3.6 mg/dL (ref 2.5–4.6)
Potassium: 3.2 mmol/L — ABNORMAL LOW (ref 3.5–5.1)
SODIUM: 139 mmol/L (ref 135–145)

## 2018-07-23 LAB — PREPARE RBC (CROSSMATCH)

## 2018-07-23 LAB — CBC
HEMATOCRIT: 23.7 % — AB (ref 36.0–46.0)
HEMOGLOBIN: 7.6 g/dL — AB (ref 12.0–15.0)
MCH: 26.5 pg (ref 26.0–34.0)
MCHC: 32.1 g/dL (ref 30.0–36.0)
MCV: 82.6 fL (ref 80.0–100.0)
NRBC: 0 % (ref 0.0–0.2)
Platelets: 41 10*3/uL — ABNORMAL LOW (ref 150–400)
RBC: 2.87 MIL/uL — ABNORMAL LOW (ref 3.87–5.11)
RDW: 17 % — AB (ref 11.5–15.5)
WBC: 4.7 10*3/uL (ref 4.0–10.5)

## 2018-07-23 LAB — HEPARIN LEVEL (UNFRACTIONATED): Heparin Unfractionated: 0.28 IU/mL — ABNORMAL LOW (ref 0.30–0.70)

## 2018-07-23 LAB — CULTURE, BLOOD (ROUTINE X 2): Special Requests: ADEQUATE

## 2018-07-23 LAB — BASIC METABOLIC PANEL
Anion gap: 9 (ref 5–15)
BUN: 41 mg/dL — AB (ref 8–23)
CALCIUM: 7.6 mg/dL — AB (ref 8.9–10.3)
CO2: 21 mmol/L — ABNORMAL LOW (ref 22–32)
CREATININE: 2.15 mg/dL — AB (ref 0.44–1.00)
Chloride: 109 mmol/L (ref 98–111)
GFR calc Af Amer: 24 mL/min — ABNORMAL LOW (ref >60)
GFR, EST NON AFRICAN AMERICAN: 20 mL/min — AB (ref >60)
Glucose, Bld: 96 mg/dL (ref 70–99)
Potassium: 3.3 mmol/L — ABNORMAL LOW (ref 3.5–5.1)
SODIUM: 139 mmol/L (ref 135–145)

## 2018-07-23 LAB — PROCALCITONIN: Procalcitonin: 6.03 ng/mL

## 2018-07-23 MED ORDER — SODIUM CHLORIDE 0.9% IV SOLUTION
Freq: Once | INTRAVENOUS | Status: AC
  Administered 2018-07-23: 12:00:00 via INTRAVENOUS

## 2018-07-23 MED ORDER — ACETAMINOPHEN 325 MG PO TABS
650.0000 mg | ORAL_TABLET | Freq: Once | ORAL | Status: AC
  Administered 2018-07-23: 650 mg via ORAL
  Filled 2018-07-23: qty 2

## 2018-07-23 MED ORDER — METOPROLOL TARTRATE 5 MG/5ML IV SOLN
5.0000 mg | INTRAVENOUS | Status: AC | PRN
  Administered 2018-07-23 – 2018-07-25 (×2): 5 mg via INTRAVENOUS
  Filled 2018-07-23 (×3): qty 5

## 2018-07-23 MED ORDER — POTASSIUM CHLORIDE CRYS ER 20 MEQ PO TBCR
40.0000 meq | EXTENDED_RELEASE_TABLET | Freq: Once | ORAL | Status: AC
  Administered 2018-07-23: 40 meq via ORAL
  Filled 2018-07-23: qty 2

## 2018-07-23 MED ORDER — DIPHENHYDRAMINE HCL 25 MG PO CAPS
25.0000 mg | ORAL_CAPSULE | Freq: Once | ORAL | Status: AC
  Administered 2018-07-23: 25 mg via ORAL
  Filled 2018-07-23: qty 1

## 2018-07-23 MED ORDER — POLYETHYLENE GLYCOL 3350 17 G PO PACK
17.0000 g | PACK | Freq: Every day | ORAL | Status: DC
  Administered 2018-07-23 – 2018-07-29 (×7): 17 g via ORAL
  Filled 2018-07-23 (×7): qty 1

## 2018-07-23 MED ORDER — HYDRALAZINE HCL 10 MG PO TABS
10.0000 mg | ORAL_TABLET | Freq: Four times a day (QID) | ORAL | Status: DC | PRN
  Administered 2018-07-23 – 2018-07-24 (×3): 10 mg via ORAL
  Filled 2018-07-23 (×3): qty 1

## 2018-07-23 NOTE — Progress Notes (Addendum)
BP 204/103, denies CP and HA , MD notified.   Barbee Shropshire. Brigitte Pulse, RN

## 2018-07-23 NOTE — Progress Notes (Signed)
Kayla Guerrero   DOB:01-18-1935   ZO#:[id number]    Assessment & Plan:  Metastatic uterine cancer CT showed stability/shrinkage of abdominal disease Even though the lung mass is bigger, with necrotic appearance, I felt that she might have positive response overall Continue supportive care  Acute renal failure, improving Could be either due to third spacing, sepsis, worsening disease in her abdomen or recent exposure to contrast and hydronephrosis.  She had recent stent placement.  Continue supportive care and close monitoring  Recent PE I recommend holding off heparin in view of ongoing bleeding and platelet count less than 50,000 We will check blood count tomorrow.  If platelet improved to greater than 50,000, okay to resume anticoagulation therapy  Acquired pancytopenia Likely due to side effects of recent chemotherapy, sepsis or side effects of antibiotic treatment We discussed some of the risks, benefits, and alternatives of blood transfusions. The patient is symptomatic from anemia and the hemoglobin level is critically low.  Some of the side-effects to be expected including risks of transfusion reactions, chills, infection, syndrome of volume overload and risk of hospitalization from various reasons and the patient is willing to proceed. I will order 1 unit of blood transfusion.  No need platelet transfusion unless platelet count is less than 10,000  Fever, resolved Blood culture was positive for E. coli, likely due to urinary tract infection She will continue antibiotic therapy  Moderate to severe protein calorie malnutrition Due to third spacing and poor oral intake She needs dietitian consult  Progressive weakness Due to recurrent hospitalization She may benefit from PT consult  Other constipation We will start her on laxative therapy  Discharge planning She is too weak to be discharged She will likely remain in the hospital over the weekend due to progressive  pancytopenia I will return to check on her next week Please call consult service if questions arise  [name], MD 07/23/2018  8:00 AM   Subjective:  She tolerated stent placement well.  She is noted to have hematuria.  Denies nausea.  She is mildly constipated.  No chest pain or shortness of breath. Noted culture is positive for E. Coli.  She has been afebrile.  Objective:  Vitals:   07/22/18 2015 07/23/18 0207  BP: (!) 150/75 (!) 151/69  Pulse: 80 81  Resp: 12 16  Temp: 98.1 F (36.7 C) 98.2 F (36.8 C)  SpO2: 95% 92%     Intake/Output Summary (Last 24 hours) at 07/23/2018 0800 Last data filed at 07/23/2018 0636 Gross per 24 hour  Intake 3887.57 ml  Output 1300 ml  Net 2587.57 ml    GENERAL:alert, no distress and comfortable SKIN: skin color, texture, turgor are normal, no rashes or significant lesions Musculoskeletal:no cyanosis of digits and no clubbing  NEURO: alert & oriented x 3 with fluent speech, no focal motor/sensory deficits   Labs:  Lab Results  Component Value Date   WBC 4.7 07/23/2018   HGB 7.6 (L) 07/23/2018   HCT 23.7 (L) 07/23/2018   MCV 82.6 07/23/2018   PLT 41 (L) 07/23/2018   NEUTROABS 7.7 07/20/2018    Lab Results  Component Value Date   NA 139 07/23/2018   NA 139 07/23/2018   K 3.3 (L) 07/23/2018   K 3.2 (L) 07/23/2018   CL 109 07/23/2018   CL 109 07/23/2018   CO2 21 (L) 07/23/2018   CO2 22 07/23/2018    Studies:  Ct Abdomen Pelvis Wo Contrast  Result Date: 07/21/2018 CLINICAL DATA:  Endometrial carcinoma.  Lung mass. EXAM: CT ABDOMEN AND PELVIS WITHOUT CONTRAST TECHNIQUE: Multidetector CT imaging of the abdomen and pelvis was performed following the standard protocol without IV contrast. COMPARISON:  CTA 07/14/2018, CT 06/11/2018 FINDINGS: Lower chest: Large RIGHT lower lobe mass again demonstrated measuring 8.8 cm compared to 7.8 cm on recent CT. There is central gas within lesions suggesting necrotic tumor. Hepatobiliary: No  focal hepatic lesion on noncontrast exam. Postcholecystectomy. Pancreas: Pancreas is normal. No ductal dilatation. No pancreatic inflammation. Spleen: Normal spleen Adrenals/urinary tract: Adrenal glands normal. There is severe hydronephrosis and hydroureter on the RIGHT. Hydroureter extends to the distal ureter where there is a mass along the RIGHT lateral wall of the bladder measuring 3.9 by 2.5 cm (image 69/2) which compares to 5.4 by 3.3 cm for potential decrease in size. There is mild hydronephrosis hydroureter ofthe LEFT collecting system without obstructing lesion identified. The hydroureter extends to the mid ureter and is increased from prior. Bladder normal Stomach/Bowel: Hiatal hernia. Stomach, duodenum small-bowel normal. No evidence of bowel obstruction. Appendix is normal. Colon rectosigmoid colon are normal Vascular/Lymphatic: Abdominal aortic normal caliber. There is intimal calcification. No retroperitoneal adenopathy. No pelvic adenopathy Reproductive: Post hysterectomy. Other: There is thickening within the ventral mesentery superior to the transverse colon (lesser omentum) measuring 7.5 by 1.9. This finding is similar to comparison exam. Small peritoneal implants along thesigmoid colon (image 57/2) also unchanged. Musculoskeletal: No aggressive osseous lesion IMPRESSION: 1. RIGHT lower lobe necrotic mass slightly increased in size from comparison exam is concerning for necrotic neoplasm / metastasis. 2. Severe hydronephrosis and hydroureter of the RIGHT collecting system secondary to obstructing at RIGHT pelvic sidewall. No significant change hydronephrosis. 3. No significant change in RIGHT pelvic sidewall mass. 4. New mild hydronephrosis hydroureter on the LEFT. No clear obstructing lesion identified. 5. Stable peritoneal and omental metastasis. Electronically Signed   By: Suzy Bouchard M.D.   On: 07/21/2018 12:49   Dg C-arm 1-60 Min-no Report  Result Date: 07/22/2018 Fluoroscopy was  utilized by the requesting physician.  No radiographic interpretation.

## 2018-07-23 NOTE — Progress Notes (Signed)
   07/23/18 1823  Vitals  BP (!) 188/87   MD notified. Pt c/o mild chest tightness exacerbated by eating and drinking food.   Barbee Shropshire. Brigitte Pulse, RN

## 2018-07-23 NOTE — Addendum Note (Signed)
Addendum  created 07/23/18 0656 by Lollie Sails, CRNA   Charge Capture section accepted

## 2018-07-23 NOTE — Progress Notes (Signed)
Patient reports red rash on palms. Denies itchiness. Denies throat itching or swelling. MD notified. Will cont to monitor.  Kayla Guerrero. Brigitte Pulse, RN

## 2018-07-23 NOTE — Evaluation (Signed)
Physical Therapy Evaluation Patient Details Name: Kayla Guerrero MRN: [id number] DOB: 09-02-1935 Today's Date: 07/23/2018   History of Present Illness  83 yo female admitted with AKI, pancytopenia, weakness. S/P L ureteral stent placement 10/24. Hx of L TKA, L foot drop, neuropathy, endometrial ca with mets, PE, DVT    Clinical Impression  On eval, pt required Min assist for mobility. She walked ~15 feet x 3 inside her room on today. Pt is weak and fatigues easily with activity. LOB x 2 while trying to stand unassisted. Pt will need to use a RW for ambulation safety until strength and balance improves. Discussed d/c plan-pt plans to return home where she lives with her husband. Will follow and progress activity as tolerated.     Follow Up Recommendations Home health PT;Supervision/Assistance - 24 hour    Equipment Recommendations  None recommended by PT    Recommendations for Other Services       Precautions / Restrictions Precautions Precautions: Fall Restrictions Weight Bearing Restrictions: No      Mobility  Bed Mobility Overal bed mobility: Needs Assistance Bed Mobility: Supine to Sit;Sit to Supine     Supine to sit: Min guard;HOB elevated Sit to supine: Min guard;HOB elevated   General bed mobility comments: Increased time.   Transfers Overall transfer level: Needs assistance   Transfers: Sit to/from Stand Sit to Stand: Min assist         General transfer comment: LOB x 2 when pt attempted to stand without assistance. Min assist to rise, stabilize, control descent. VCs safety.   Ambulation/Gait   Gait Distance (Feet): 15 Feet(x3) Assistive device: IV Pole Gait Pattern/deviations: Step-through pattern;Decreased stride length Min Assist   General Gait Details: Assist to stabilize pt throughout short distance. Very unsteady and weak. Will need RW for safe ambulation as activity progresses. Dyspnea 3/4. O2 sat >90% on RA.  Stairs             Wheelchair Mobility    Modified Rankin (Stroke Patients Only)       Balance Overall balance assessment: Needs assistance         Standing balance support: Bilateral upper extremity supported Standing balance-Leahy Scale: Poor                               Pertinent Vitals/Pain Pain Assessment: No/denies pain    Home Living Family/patient expects to be discharged to:: Private residence Living Arrangements: Spouse/significant other Available Help at Discharge: Family Type of Home: Independent living facility       Home Layout: One level Home Equipment: Environmental consultant - 2 wheels      Prior Function Level of Independence: Independent               Hand Dominance        Extremity/Trunk Assessment   Upper Extremity Assessment Upper Extremity Assessment: Generalized weakness    Lower Extremity Assessment Lower Extremity Assessment: Generalized weakness    Cervical / Trunk Assessment Cervical / Trunk Assessment: Normal  Communication   Communication: No difficulties  Cognition Arousal/Alertness: Awake/alert Behavior During Therapy: WFL for tasks assessed/performed Overall Cognitive Status: Within Functional Limits for tasks assessed                                        General Comments      Exercises  Assessment/Plan    PT Assessment Patient needs continued PT services  PT Problem List Decreased balance;Decreased strength;Decreased mobility;Decreased activity tolerance;Decreased cognition       PT Treatment Interventions DME instruction;Gait training;Functional mobility training;Therapeutic activities;Balance training;Patient/family education;Therapeutic exercise    PT Goals (Current goals can be found in the Care Plan section)  Acute Rehab PT Goals Patient Stated Goal: to get stronger, regain PLOF PT Goal Formulation: With patient Time For Goal Achievement: 08/06/18 Potential to Achieve Goals: Good     Frequency Min 3X/week   Barriers to discharge        Co-evaluation               AM-PAC PT "6 Clicks" Daily Activity  Outcome Measure Difficulty turning over in bed (including adjusting bedclothes, sheets and blankets)?: A Lot Difficulty moving from lying on back to sitting on the side of the bed? : A Lot Difficulty sitting down on and standing up from a chair with arms (e.g., wheelchair, bedside commode, etc,.)?: Unable Help needed moving to and from a bed to chair (including a wheelchair)?: A Little Help needed walking in hospital room?: A Little Help needed climbing 3-5 steps with a railing? : A Lot 6 Click Score: 13    End of Session Equipment Utilized During Treatment: Gait belt Activity Tolerance: Patient limited by fatigue Patient left: in bed;with call bell/phone within reach;with bed alarm set   PT Visit Diagnosis: Muscle weakness (generalized) (M62.81);Difficulty in walking, not elsewhere classified (R26.2)    Time: 2952-8413 PT Time Calculation (min) (ACUTE ONLY): 16 min   Charges:   PT Evaluation $PT Eval Moderate Complexity: [place], PT Acute Rehabilitation Services Pager: [phone] Office: [phone]

## 2018-07-23 NOTE — Progress Notes (Signed)
1 Day Post-Op Subjective: Patient feels weak but does not c/o pain  Objective: Vital signs in last 24 hours: Temp:  [97.5 F (36.4 C)-98.4 F (36.9 C)] 98.3 F (36.8 C) (10/25 1215) Pulse Rate:  [78-89] 84 (10/25 1215) Resp:  [12-18] 16 (10/25 1215) BP: (150-188)/(69-89) 188/89 (10/25 1215) SpO2:  [92 %-98 %] 98 % (10/25 1215) Weight:  [81.6 kg] 81.6 kg (10/24 1456)  Intake/Output from previous day: 10/24 0701 - 10/25 0700 In: 3887.6 [P.O.:720; I.V.:3167.6] Out: 1300 [Urine:1300] Intake/Output this shift: Total I/O In: 440 [P.O.:440] Out: 775 [Urine:775]  Physical Exam:  Constitutional: Vital signs reviewed. WD WN in NAD   Eyes: PERRL, No scleral icterus.   Cardiovascular: RRR Extremities: No cyanosis or edema   Lab Results: Recent Labs    07/21/18 0346 07/22/18 0054 07/23/18 0346  HGB 8.1* 8.0* 7.6*  HCT 25.2* 24.6* 23.7*   BMET Recent Labs    07/22/18 0054 07/23/18 0346  NA 131* 139  139  K 3.6 3.3*  3.2*  CL 101 109  109  CO2 20* 21*  22  GLUCOSE 130* 96  98  BUN 46* 41*  42*  CREATININE 2.42* 2.15*  2.08*  CALCIUM 7.2* 7.6*  7.6*   No results for input(s): LABPT, INR in the last 72 hours. No results for input(s): LABURIN in the last 72 hours. Results for orders placed or performed during the hospital encounter of 07/20/18  Culture, blood (routine x 2)     Status: Abnormal   Collection Time: 07/21/18  8:13 AM  Result Value Ref Range Status   Specimen Description   Final    BLOOD RIGHT ARM Performed at [facility] [address] [place], [place] [address]    Special Requests   Final    BOTTLES DRAWN AEROBIC AND ANAEROBIC Blood Culture adequate volume Performed at [facility] [address] [place], [place] [address]    Culture  Setup Time   Final    GRAM NEGATIVE RODS AEROBIC BOTTLE ONLY CRITICAL RESULT CALLED TO, READ BACK BY AND VERIFIED WITH: Lavell Luster [facility] 07/22/18 0026 JDW Performed  at [facility], [place] [address], [place], [place] [address]    Culture ESCHERICHIA COLI (A)  Final   Report Status 07/23/2018 FINAL  Final   Organism ID, Bacteria ESCHERICHIA COLI  Final      Susceptibility   Escherichia coli - MIC*    AMPICILLIN <=2 SENSITIVE Sensitive     CEFAZOLIN <=4 SENSITIVE Sensitive     CEFEPIME <=1 SENSITIVE Sensitive     CEFTAZIDIME <=1 SENSITIVE Sensitive     CEFTRIAXONE <=1 SENSITIVE Sensitive     CIPROFLOXACIN <=0.25 SENSITIVE Sensitive     GENTAMICIN <=1 SENSITIVE Sensitive     IMIPENEM <=0.25 SENSITIVE Sensitive     TRIMETH/SULFA <=20 SENSITIVE Sensitive     AMPICILLIN/SULBACTAM <=2 SENSITIVE Sensitive     PIP/TAZO <=4 SENSITIVE Sensitive     Extended ESBL NEGATIVE Sensitive     * ESCHERICHIA COLI  Blood Culture ID Panel (Reflexed)     Status: Abnormal   Collection Time: 07/21/18  8:13 AM  Result Value Ref Range Status   Enterococcus species NOT DETECTED NOT DETECTED Final   Listeria monocytogenes NOT DETECTED NOT DETECTED Final   Staphylococcus species NOT DETECTED NOT DETECTED Final   Staphylococcus aureus (BCID) NOT DETECTED NOT DETECTED Final   Streptococcus species NOT DETECTED NOT DETECTED Final   Streptococcus agalactiae NOT DETECTED NOT DETECTED  Final   Streptococcus pneumoniae NOT DETECTED NOT DETECTED Final   Streptococcus pyogenes NOT DETECTED NOT DETECTED Final   Acinetobacter baumannii NOT DETECTED NOT DETECTED Final   Enterobacteriaceae species DETECTED (A) NOT DETECTED Final    Comment: Enterobacteriaceae represent a large family of gram-negative bacteria, not a single organism. CRITICAL RESULT CALLED TO, READ BACK BY AND VERIFIED WITH: J GRIMSLEY PHARMD 07/22/18 0026 JDW    Enterobacter cloacae complex NOT DETECTED NOT DETECTED Final   Escherichia coli DETECTED (A) NOT DETECTED Final    Comment: CRITICAL RESULT CALLED TO, READ BACK BY AND VERIFIED WITH: J GRIMSLEY PHARMD  07/22/18 0026 JDW    Klebsiella oxytoca NOT  DETECTED NOT DETECTED Final   Klebsiella pneumoniae NOT DETECTED NOT DETECTED Final   Proteus species NOT DETECTED NOT DETECTED Final   Serratia marcescens NOT DETECTED NOT DETECTED Final   Carbapenem resistance NOT DETECTED NOT DETECTED Final   Haemophilus influenzae NOT DETECTED NOT DETECTED Final   Neisseria meningitidis NOT DETECTED NOT DETECTED Final   Pseudomonas aeruginosa NOT DETECTED NOT DETECTED Final   Candida albicans NOT DETECTED NOT DETECTED Final   Candida glabrata NOT DETECTED NOT DETECTED Final   Candida krusei NOT DETECTED NOT DETECTED Final   Candida parapsilosis NOT DETECTED NOT DETECTED Final   Candida tropicalis NOT DETECTED NOT DETECTED Final    Comment: Performed at [facility], [place] [address], [place], [place] [address]  Culture, blood (routine x 2)     Status: None (Preliminary result)   Collection Time: 07/21/18  8:20 AM  Result Value Ref Range Status   Specimen Description   Final    BLOOD RIGHT WRIST Performed at [facility] [address], [place], [place] [address]    Special Requests   Final    BOTTLES DRAWN AEROBIC AND ANAEROBIC Blood Culture adequate volume Performed at [facility] [address], [place], [place] [address]    Culture  Setup Time   Final    GRAM NEGATIVE RODS AEROBIC BOTTLE ONLY CRITICAL VALUE NOTED.  VALUE IS CONSISTENT WITH PREVIOUSLY REPORTED AND CALLED VALUE. Performed at [facility], [place] [address] [place], [place] [address]    Culture GRAM NEGATIVE RODS  Final   Report Status PENDING  Incomplete  Urine culture     Status: Abnormal (Preliminary result)   Collection Time: 07/21/18  8:51 AM  Result Value Ref Range Status   Specimen Description   Final    URINE, CLEAN CATCH Performed at [facility], [place] [address] [place], [place] [address]    Special Requests   Final    NONE Performed at [facility], [place] [address],  [place],  [address]    Culture (A)  Final    >=100,000 COLONIES/mL ESCHERICHIA COLI CULTURE REINCUBATED FOR BETTER GROWTH Performed at [facility], [place] [address], [place], [place] [address]    Report Status PENDING  Incomplete   Organism ID, Bacteria ESCHERICHIA COLI (A)  Final      Susceptibility   Escherichia coli - MIC*    AMPICILLIN 4 SENSITIVE Sensitive     CEFAZOLIN <=4 SENSITIVE Sensitive     CEFTRIAXONE <=1 SENSITIVE Sensitive     CIPROFLOXACIN <=0.25 SENSITIVE Sensitive     GENTAMICIN <=1 SENSITIVE Sensitive     IMIPENEM <=0.25 SENSITIVE Sensitive     NITROFURANTOIN <=16 SENSITIVE Sensitive     TRIMETH/SULFA <=20 SENSITIVE Sensitive  AMPICILLIN/SULBACTAM <=2 SENSITIVE Sensitive     PIP/TAZO <=4 SENSITIVE Sensitive     Extended ESBL NEGATIVE Sensitive     * >=100,000 COLONIES/mL ESCHERICHIA COLI    Studies/Results: Dg C-arm 1-60 Min-no Report  Result Date: 07/22/2018 Fluoroscopy was utilized by the requesting physician.  No radiographic interpretation.    Assessment/Plan:   POD 1 Lt J2 stent. Rt renall unit not drained as total ureteral obstruction present.  Her Cr is trending down.   If necessary, Rt PCN tube can be placed, but w/ low PLT count, would hold off unless Cr does not trend down.  Will cont to follow   LOS: 1 day   Jorja Loa 07/23/2018, 1:05 PM

## 2018-07-23 NOTE — Progress Notes (Signed)
Talked with Kennon Holter, NP who clarified the Lopressor order to be x 2 doses prn only.

## 2018-07-23 NOTE — Progress Notes (Signed)
PROGRESS NOTE    Kayla Guerrero  YYQ:[id number] DOB: Mar 06, 1935 DOA: 07/20/2018 PCP: Rita Ohara, MD   Brief Narrative:  83 year old with past medical history relevant for hypertension, hypothyroidism, widely metastatic endometrial carcinoma with disease to the lungs, abdomen with recent chemotherapy on 07/12/2018, stage III CKD, recent diagnosis of submassive PE and bilateral DVT on enoxaparin who was admitted on 07/20/2018 with profound weakness, intractable nausea, vomiting and found to have E. coli sepsis likely due to post renal obstruction from malignant source status post left ureteral stent placement on 07/22/2018.   Assessment & Plan:   Active Problems:   Chemotherapy induced nausea and vomiting   Anemia   Hyponatremia   Hypothyroidism   PE (pulmonary thromboembolism) (HCC)   Leg DVT (deep venous thromboembolism), acute, bilateral (HCC)   AKI (acute kidney injury) ([place])   #) E. coli bacteremia: Likely from left-sided hydronephrosis. - blood cultures on 07/21/2018 grew out E. coli -Continue IV ceftriaxone 2 g daily 07/22/2018  #) AKI on stage III CKD: Combination of sepsis and post renal.  Status post left ureteral stent placed on 07/22/2018. -IV fluids -Avoid nephrotoxins -Urology following, appreciate recommendations -Per oncology fluid hold off on placement of PCN tubes on right if creatinine continues to improve.  #) Widely metastatic endometrial carcinoma: Patient is status post 2 cycles of chemotherapy.  -Continue hydromorphone for pain control -Continue dexamethasone for nausea  -oncology following appreciate recommendations, Dr. Simeon Craft such  #) DVT/submassive PE: Patient was recently diagnosed with this.  Likely provoked in the setting of malignancy. -Hold enoxaparin 1 mg/kg -Hold heparin infusion in the setting of thrombocytopenia  #) Anemia/thrombus cytopenia: Likely due to recent chemotherapy. -Supportive care, will restart heparin drip if platelets  increase over 50  #) Hypothyroidism: -Continue levothyroxine 88 mcg  #) Pain/psych: -Continue duloxetine 30 mg nightly  #) Hypertension: -Hold amlodipine 10 mg  Fluids: IV fluids Electrodes: Monitor and supplement Nutrition: Regular diet  Prophylaxis: Heparin drip  Disposition: Antibiotic course and decompression of urinary system  Full code    Consultants:   Oncology, Dr. Lottie Rater  Urology  Procedures:  07/22/2018 pending placement of ureteral stents  Antimicrobials:   IV cefepime and vancomycin 07/21/2018 to 07/22/2018  IV ceftriaxone 2 g started 07/22/2018   Subjective: Patient reports that she is feeling quite tired.  She denies any nausea, vomiting, diarrhea.  Her abdominal pain is improved somewhat.  She has noted some blood-tinged urine in her catheter.  Objective: Vitals:   07/23/18 0207 07/23/18 0856 07/23/18 1036 07/23/18 1038  BP: (!) 151/69  (!) 179/83 (!) 179/75  Pulse: 81  89 87  Resp: 16  16 18   Temp: 98.2 F (36.8 C)  (!) 97.5 F (36.4 C)   TempSrc: Oral  Oral   SpO2: 92% 93% 93% 95%  Weight:      Height:        Intake/Output Summary (Last 24 hours) at 07/23/2018 1101 Last data filed at 07/23/2018 0938 Gross per 24 hour  Intake 4007.57 ml  Output 1775 ml  Net 2232.57 ml   Filed Weights   07/22/18 1456  Weight: 81.6 kg    Examination:  General exam: Appears calm and comfortable  Respiratory system: Clear to auscultation. Respiratory effort normal.  Diminished lung sounds at bases Cardiovascular system: Regular rate and rhythm, no murmurs Gastrointestinal system: Soft, no tender to deep palpation, no rebound or guarding, plus bowel sounds Central nervous system: Alert and oriented. No focal neurological deficits. Extremities: 1+ lower extremity  edema Skin: Port site is clean dry and intact Psychiatry: Judgement and insight appear normal. Mood & affect appropriate.     Data Reviewed: I have personally reviewed following  labs and imaging studies  CBC: Recent Labs  Lab 07/17/18 0419 07/20/18 0915 07/21/18 0346 07/22/18 0054 07/23/18 0346  WBC 4.2 8.2 6.4 7.1 4.7  NEUTROABS  --  7.7  --   --   --   HGB 9.4* 8.7* 8.1* 8.0* 7.6*  HCT 29.7* 27.0* 25.2* 24.6* 23.7*  MCV 84.9 82.6 82.1 81.7 82.6  PLT 191 82* 66* 61* 41*   Basic Metabolic Panel: Recent Labs  Lab 07/17/18 0419 07/20/18 0915 07/21/18 0346 07/22/18 0054 07/23/18 0346  NA 135 131* 130* 131* 139  139  K 3.6 3.8 3.7 3.6 3.3*  3.2*  CL 100 96* 97* 101 109  109  CO2 28 24 21* 20* 21*  22  GLUCOSE 87 122* 113* 130* 96  98  BUN 22 41* 45* 46* 41*  42*  CREATININE 1.13* 2.66* 2.67* 2.42* 2.15*  2.08*  CALCIUM 8.2* 7.2* 6.7* 7.2* 7.6*  7.6*  MG  --   --   --  1.7  --   PHOS  --   --   --   --  3.6   GFR: Estimated Creatinine Clearance: 22.1 mL/min (A) (by C-G formula based on SCr of 2.15 mg/dL (H)). Liver Function Tests: Recent Labs  Lab 07/20/18 0915 07/21/18 0346 07/22/18 0054 07/23/18 0346  AST 27 21 19   --   ALT 13 12 12   --   ALKPHOS 70 62 62  --   BILITOT 0.7 0.8 0.4  --   PROT 6.1* 5.5* 5.4*  --   ALBUMIN 2.8* 2.4* 2.3* 2.2*   No results for input(s): LIPASE, AMYLASE in the last 168 hours. No results for input(s): AMMONIA in the last 168 hours. Coagulation Profile: No results for input(s): INR, PROTIME in the last 168 hours. Cardiac Enzymes: No results for input(s): CKTOTAL, CKMB, CKMBINDEX, TROPONINI in the last 168 hours. BNP (last 3 results) No results for input(s): PROBNP in the last 8760 hours. HbA1C: No results for input(s): HGBA1C in the last 72 hours. CBG: No results for input(s): GLUCAP in the last 168 hours. Lipid Profile: No results for input(s): CHOL, HDL, LDLCALC, TRIG, CHOLHDL, LDLDIRECT in the last 72 hours. Thyroid Function Tests: No results for input(s): TSH, T4TOTAL, FREET4, T3FREE, THYROIDAB in the last 72 hours. Anemia Panel: No results for input(s): VITAMINB12, FOLATE, FERRITIN,  TIBC, IRON, RETICCTPCT in the last 72 hours. Sepsis Labs: Recent Labs  Lab 07/21/18 0813 07/22/18 0054 07/23/18 0346  PROCALCITON 15.85 13.47 6.03    Recent Results (from the past 240 hour(s))  Urine culture     Status: None   Collection Time: 07/14/18  8:04 PM  Result Value Ref Range Status   Specimen Description   Final    URINE, CLEAN CATCH Performed at [facility], [place] [address], [place], [place] [address]    Special Requests   Final    Normal Performed at [facility], [place] [address] [place], [place] [address]    Culture   Final    NO GROWTH Performed at [facility], [place] [address] [place], [place] [address]    Report Status 07/16/2018 FINAL  Final  MRSA PCR Screening     Status: None   Collection Time: 07/15/18  3:00 AM  Result Value Ref Range Status  MRSA by PCR NEGATIVE NEGATIVE Final    Comment:        The GeneXpert MRSA Assay (FDA approved for NASAL specimens only), is one component of a comprehensive MRSA colonization surveillance program. It is not intended to diagnose MRSA infection nor to guide or monitor treatment for MRSA infections. Performed at [facility], [place] [address], [place], [place] [address]   Culture, blood (routine x 2)     Status: Abnormal   Collection Time: 07/21/18  8:13 AM  Result Value Ref Range Status   Specimen Description   Final    BLOOD RIGHT ARM Performed at [facility] [address], [place], [place] [address]    Special Requests   Final    BOTTLES DRAWN AEROBIC AND ANAEROBIC Blood Culture adequate volume Performed at [facility] [address], [place], [place] [address]    Culture  Setup Time   Final    GRAM NEGATIVE RODS AEROBIC BOTTLE ONLY CRITICAL RESULT CALLED TO, READ BACK BY AND VERIFIED WITH: Lavell Luster [facility] 07/22/18 0026 JDW Performed at [facility], [place] [address] [place], [place]  [address]    Culture ESCHERICHIA COLI (A)  Final   Report Status 07/23/2018 FINAL  Final   Organism ID, Bacteria ESCHERICHIA COLI  Final      Susceptibility   Escherichia coli - MIC*    AMPICILLIN <=2 SENSITIVE Sensitive     CEFAZOLIN <=4 SENSITIVE Sensitive     CEFEPIME <=1 SENSITIVE Sensitive     CEFTAZIDIME <=1 SENSITIVE Sensitive     CEFTRIAXONE <=1 SENSITIVE Sensitive     CIPROFLOXACIN <=0.25 SENSITIVE Sensitive     GENTAMICIN <=1 SENSITIVE Sensitive     IMIPENEM <=0.25 SENSITIVE Sensitive     TRIMETH/SULFA <=20 SENSITIVE Sensitive     AMPICILLIN/SULBACTAM <=2 SENSITIVE Sensitive     PIP/TAZO <=4 SENSITIVE Sensitive     Extended ESBL NEGATIVE Sensitive     * ESCHERICHIA COLI  Blood Culture ID Panel (Reflexed)     Status: Abnormal   Collection Time: 07/21/18  8:13 AM  Result Value Ref Range Status   Enterococcus species NOT DETECTED NOT DETECTED Final   Listeria monocytogenes NOT DETECTED NOT DETECTED Final   Staphylococcus species NOT DETECTED NOT DETECTED Final   Staphylococcus aureus (BCID) NOT DETECTED NOT DETECTED Final   Streptococcus species NOT DETECTED NOT DETECTED Final   Streptococcus agalactiae NOT DETECTED NOT DETECTED Final   Streptococcus pneumoniae NOT DETECTED NOT DETECTED Final   Streptococcus pyogenes NOT DETECTED NOT DETECTED Final   Acinetobacter baumannii NOT DETECTED NOT DETECTED Final   Enterobacteriaceae species DETECTED (A) NOT DETECTED Final    Comment: Enterobacteriaceae represent a large family of gram-negative bacteria, not a single organism. CRITICAL RESULT CALLED TO, READ BACK BY AND VERIFIED WITH: J GRIMSLEY PHARMD 07/22/18 0026 JDW    Enterobacter cloacae complex NOT DETECTED NOT DETECTED Final   Escherichia coli DETECTED (A) NOT DETECTED Final    Comment: CRITICAL RESULT CALLED TO, READ BACK BY AND VERIFIED WITH: J GRIMSLEY PHARMD  07/22/18 0026 JDW    Klebsiella oxytoca NOT DETECTED NOT DETECTED Final   Klebsiella pneumoniae NOT DETECTED  NOT DETECTED Final   Proteus species NOT DETECTED NOT DETECTED Final   Serratia marcescens NOT DETECTED NOT DETECTED Final   Carbapenem resistance NOT DETECTED NOT DETECTED Final   Haemophilus influenzae NOT DETECTED NOT DETECTED Final   Neisseria meningitidis NOT DETECTED NOT DETECTED Final   Pseudomonas  aeruginosa NOT DETECTED NOT DETECTED Final   Candida albicans NOT DETECTED NOT DETECTED Final   Candida glabrata NOT DETECTED NOT DETECTED Final   Candida krusei NOT DETECTED NOT DETECTED Final   Candida parapsilosis NOT DETECTED NOT DETECTED Final   Candida tropicalis NOT DETECTED NOT DETECTED Final    Comment: Performed at [facility], [place] [address], [place], [place] [address]  Culture, blood (routine x 2)     Status: None (Preliminary result)   Collection Time: 07/21/18  8:20 AM  Result Value Ref Range Status   Specimen Description   Final    BLOOD RIGHT WRIST Performed at [facility] [address] [place], [place] [address]    Special Requests   Final    BOTTLES DRAWN AEROBIC AND ANAEROBIC Blood Culture adequate volume Performed at [facility] [address] [place], [place] [address]    Culture  Setup Time   Final    GRAM NEGATIVE RODS AEROBIC BOTTLE ONLY CRITICAL VALUE NOTED.  VALUE IS CONSISTENT WITH PREVIOUSLY REPORTED AND CALLED VALUE. Performed at [facility], [place] [address] [place], [place] [address]    Culture GRAM NEGATIVE RODS  Final   Report Status PENDING  Incomplete  Urine culture     Status: Abnormal (Preliminary result)   Collection Time: 07/21/18  8:51 AM  Result Value Ref Range Status   Specimen Description   Final    URINE, CLEAN CATCH Performed at [facility], [place] [address] [place], [place] [address]    Special Requests   Final    NONE Performed at [facility], [place] [address], [place],  [address]    Culture (A)  Final    >=100,000  COLONIES/mL ESCHERICHIA COLI CULTURE REINCUBATED FOR BETTER GROWTH Performed at [facility], [place] [address], [place], [place] [address]    Report Status PENDING  Incomplete   Organism ID, Bacteria ESCHERICHIA COLI (A)  Final      Susceptibility   Escherichia coli - MIC*    AMPICILLIN 4 SENSITIVE Sensitive     CEFAZOLIN <=4 SENSITIVE Sensitive     CEFTRIAXONE <=1 SENSITIVE Sensitive     CIPROFLOXACIN <=0.25 SENSITIVE Sensitive     GENTAMICIN <=1 SENSITIVE Sensitive     IMIPENEM <=0.25 SENSITIVE Sensitive     NITROFURANTOIN <=16 SENSITIVE Sensitive     TRIMETH/SULFA <=20 SENSITIVE Sensitive     AMPICILLIN/SULBACTAM <=2 SENSITIVE Sensitive     PIP/TAZO <=4 SENSITIVE Sensitive     Extended ESBL NEGATIVE Sensitive     * >=100,000 COLONIES/mL ESCHERICHIA COLI         Radiology Studies: Ct Abdomen Pelvis Wo Contrast  Result Date: 07/21/2018 CLINICAL DATA:  Endometrial carcinoma.  Lung mass. EXAM: CT ABDOMEN AND PELVIS WITHOUT CONTRAST TECHNIQUE: Multidetector CT imaging of the abdomen and pelvis was performed following the standard protocol without IV contrast. COMPARISON:  CTA 07/14/2018, CT 06/11/2018 FINDINGS: Lower chest: Large RIGHT lower lobe mass again demonstrated measuring 8.8 cm compared to 7.8 cm on recent CT. There is central gas within lesions suggesting necrotic tumor. Hepatobiliary: No focal hepatic lesion on noncontrast exam. Postcholecystectomy. Pancreas: Pancreas is normal. No ductal dilatation. No pancreatic inflammation. Spleen: Normal spleen Adrenals/urinary tract: Adrenal glands normal. There is severe hydronephrosis and hydroureter on the RIGHT. Hydroureter extends to the distal ureter where there is a mass along the RIGHT lateral wall of the bladder measuring 3.9 by 2.5 cm (image 69/2)  which compares to 5.4 by 3.3 cm for potential decrease in size. There is mild hydronephrosis hydroureter ofthe LEFT collecting system without obstructing lesion identified. The  hydroureter extends to the mid ureter and is increased from prior. Bladder normal Stomach/Bowel: Hiatal hernia. Stomach, duodenum small-bowel normal. No evidence of bowel obstruction. Appendix is normal. Colon rectosigmoid colon are normal Vascular/Lymphatic: Abdominal aortic normal caliber. There is intimal calcification. No retroperitoneal adenopathy. No pelvic adenopathy Reproductive: Post hysterectomy. Other: There is thickening within the ventral mesentery superior to the transverse colon (lesser omentum) measuring 7.5 by 1.9. This finding is similar to comparison exam. Small peritoneal implants along thesigmoid colon (image 57/2) also unchanged. Musculoskeletal: No aggressive osseous lesion IMPRESSION: 1. RIGHT lower lobe necrotic mass slightly increased in size from comparison exam is concerning for necrotic neoplasm / metastasis. 2. Severe hydronephrosis and hydroureter of the RIGHT collecting system secondary to obstructing at RIGHT pelvic sidewall. No significant change hydronephrosis. 3. No significant change in RIGHT pelvic sidewall mass. 4. New mild hydronephrosis hydroureter on the LEFT. No clear obstructing lesion identified. 5. Stable peritoneal and omental metastasis. Electronically Signed   By: Suzy Bouchard M.D.   On: 07/21/2018 12:49   Dg C-arm 1-60 Min-no Report  Result Date: 07/22/2018 Fluoroscopy was utilized by the requesting physician.  No radiographic interpretation.        Scheduled Meds: . sodium chloride   Intravenous Once  . acetaminophen  650 mg Oral Once  . dexamethasone  4 mg Oral Daily  . diphenhydrAMINE  25 mg Oral Once  . dronabinol  2.5 mg Oral BID AC  . levothyroxine  88 mcg Oral Q0600  . Melatonin  5 mg Oral QHS  . metoCLOPramide  10 mg Oral TID AC  . polyethylene glycol  17 g Oral Daily  . sodium chloride flush  10-40 mL Intracatheter Q12H  . triamcinolone  1 spray Nasal Daily   Continuous Infusions: . sodium chloride 100 mL/hr at 07/23/18 0241  .  cefTRIAXone (ROCEPHIN)  IV 2 g (07/22/18 0825)     LOS: 1 day    Time spent: [place], MD [facility]  If 7PM-7AM, please contact night-coverage [email or web address] Password [email or web address] 07/23/2018, 11:01 AM

## 2018-07-23 NOTE — Progress Notes (Signed)
[name] tending high. 180/84 currently. Patient denies headache and epistaxis. MD notified. Will continue to monitor.  Kayla Guerrero. Brigitte Pulse, RN

## 2018-07-23 NOTE — Progress Notes (Signed)
ANTICOAGULATION CONSULT NOTE - Follow Up Consult  Pharmacy Consult for Heparin Indication: pulmonary embolus and DVT  Allergies  Allergen Reactions  . Levofloxacin Other (See Comments)    Unknown  . Promethazine Hcl Other (See Comments)    fainting  . Sulfa Antibiotics Swelling  . Sulfa Drugs Cross Reactors Swelling  . Sulfur Swelling    Patient Measurements: Height: 5\' 6"  (167.6 cm) Weight: 180 lb (81.6 kg) IBW/kg (Calculated) : 59.3 Heparin Dosing Weight:   Vital Signs: Temp: 98.2 F (36.8 C) (10/25 0207) Temp Source: Oral (10/25 0207) BP: 151/69 (10/25 0207) Pulse Rate: 81 (10/25 0207)  Labs: Recent Labs    07/21/18 0346  07/22/18 0054 07/22/18 1112 07/22/18 1955 07/23/18 0346  HGB 8.1*  --  8.0*  --   --  7.6*  HCT 25.2*  --  24.6*  --   --  23.7*  PLT 66*  --  61*  --   --  41*  HEPARINUNFRC  --    < > 0.46 0.35 0.29* 0.28*  CREATININE 2.67*  --  2.42*  --   --  2.15*  2.08*   < > = values in this interval not displayed.    Estimated Creatinine Clearance: 22.1 mL/min (A) (by C-G formula based on SCr of 2.15 mg/dL (H)).   Medications:  Infusions:  . sodium chloride 100 mL/hr at 07/23/18 0241  . cefTRIAXone (ROCEPHIN)  IV 2 g (07/22/18 0825)  . heparin 1,250 Units/hr (07/23/18 0636)    Assessment: Patient with low heparin level.  No heparin issues per RN.  Goal of Therapy:  Heparin level 0.3-0.7 units/ml Monitor platelets by anticoagulation protocol: Yes   Plan:  Increase heparin to 1250 units/hr Recheck heparin level at [place], Saria Haran Crowford 07/23/2018,6:46 AM

## 2018-07-23 NOTE — Progress Notes (Signed)
[facility] for heparin Indication: Bilateral PE & DVT  Allergies  Allergen Reactions  . Levofloxacin Other (See Comments)    Unknown  . Promethazine Hcl Other (See Comments)    fainting  . Sulfa Antibiotics Swelling  . Sulfa Drugs Cross Reactors Swelling  . Sulfur Swelling   Patient Measurements: Height: 5\' 6"  (167.6 cm) Weight: 180 lb (81.6 kg) IBW/kg (Calculated) : 59.3 Heparin Dosing Weight: 76.4  Vital Signs: Temp: 98.1 F (36.7 C) (10/25 1306) Temp Source: Oral (10/25 1306) BP: 150/90 (10/25 1321) Pulse Rate: 87 (10/25 1306)  Labs: Recent Labs    07/21/18 0346  07/22/18 0054 07/22/18 1112 07/22/18 1955 07/23/18 0346  HGB 8.1*  --  8.0*  --   --  7.6*  HCT 25.2*  --  24.6*  --   --  23.7*  PLT 66*  --  61*  --   --  41*  HEPARINUNFRC  --    < > 0.46 0.35 0.29* 0.28*  CREATININE 2.67*  --  2.42*  --   --  2.15*  2.08*   < > = values in this interval not displayed.   Estimated Creatinine Clearance: 22.1 mL/min (A) (by C-G formula based on SCr of 2.15 mg/dL (H)).  Medical History: Past Medical History:  Diagnosis Date  . Aortic atherosclerosis ([place])   . Arthritis   . Arthritis of knee, left   . Borderline osteopenia DEXA 2006 and 2012  . Cancer [facility])    endometrial  . Cataract   . Cholesterol serum elevated   . Colon polyps   . DDD (degenerative disc disease), lumbar   . DDD (degenerative disc disease), lumbar   . Diverticulitis of sigmoid colon    Moderate  . Facet arthropathy, lumbar   . FHx: colon cancer   . GERD (gastroesophageal reflux disease)   . History of hiatal hernia    Moderate  . History of radiation therapy 08/06/16 - 09/03/16   vaginal cuff treated to 30 Gy in 5 fractions  . Hypothyroidism   . Kidney lesion, native, right   . Left foot drop   . Leukopenia   . Neuropathy    chemo indiced ; in right hand and bilateral feet   . Obesity   . Pelvic abscess in female 01/2017   Right  .  Spondylisthesis   . Umbilical hernia    Assessment: 84 yoF with PMH of metastatic endometrial cancer with bilateral PE and B DVT diagnosed on 07/14/18.  Pt in ED 10/22 with N/V and AKI.  Pharmacy consulted to dose heparin due to AKI and thrombocytopenia.  She was on LMWH 80 mg sq q12 hours with last dose this morning at 08 am. Her SCr was 1.13 on 10/19 and 2.66 today with CrCl 18 ml/min.  The LMWH will be in her system at least 24 hours.  Her heparin levels were therapeutic 10/17 and 10/18 on heparin drip at 1000 units/hr.  Hg low at 8.7 (9.4 on 10/19) and PLTC low at 82.  No overt bleeding reported. Heparin at 1000 units/hr, no bolus, begin 10/23 at 8am. (24 hours after last LMWH 80 mg dose) No bolus if Heparin level low  Today, 07/23/2018 Heparin held by Onc MD for hematuria, Plt 41K Acquired pancytopenia  Transfuse PRBC Threshold for platelet transfusion <10K  Goal of Therapy:  Heparin level 0.3-0.7 units/ml Monitor platelets by anticoagulation protocol: Yes  Would d/c heparin drip if platelets fall below 30K  per MD notes   Plan:   Hold Heparin, discontinue Heparin level  Will leave Protocol order active, follow patient daily for CBC, resumption of anti-coagulation  CBC in am, if platelet count improved to greater than 50,000, okay to resume anticoagulation therapy  Minda Ditto PharmD Pager [phone] 07/23/2018, 3:07 PM

## 2018-07-24 LAB — CBC WITH DIFFERENTIAL/PLATELET
ABS IMMATURE GRANULOCYTES: 0.04 10*3/uL (ref 0.00–0.07)
Abs Immature Granulocytes: 0.05 10*3/uL (ref 0.00–0.07)
Basophils Absolute: 0 10*3/uL (ref 0.0–0.1)
Basophils Absolute: 0 10*3/uL (ref 0.0–0.1)
Basophils Relative: 0 %
Basophils Relative: 0 %
EOS ABS: 0 10*3/uL (ref 0.0–0.5)
EOS PCT: 0 %
Eosinophils Absolute: 0 K/uL (ref 0.0–0.5)
Eosinophils Relative: 0 %
HCT: 30.4 % — ABNORMAL LOW (ref 36.0–46.0)
HCT: 30.2 % — ABNORMAL LOW (ref 36.0–46.0)
HEMOGLOBIN: 9.6 g/dL — AB (ref 12.0–15.0)
Hemoglobin: 9.6 g/dL — ABNORMAL LOW (ref 12.0–15.0)
IMMATURE GRANULOCYTES: 1 %
Immature Granulocytes: 1 %
Lymphocytes Relative: 16 %
Lymphocytes Relative: 4 %
Lymphs Abs: 0.7 10*3/uL (ref 0.7–4.0)
Lymphs Abs: 0.2 K/uL — ABNORMAL LOW (ref 0.7–4.0)
MCH: 26.2 pg (ref 26.0–34.0)
MCH: 26.3 pg (ref 26.0–34.0)
MCHC: 31.6 g/dL (ref 30.0–36.0)
MCHC: 31.8 g/dL (ref 30.0–36.0)
MCV: 83.1 fL (ref 80.0–100.0)
MCV: 82.7 fL (ref 80.0–100.0)
MONO ABS: 0.2 10*3/uL (ref 0.1–1.0)
Monocytes Absolute: 0.1 10*3/uL (ref 0.1–1.0)
Monocytes Relative: 5 %
Monocytes Relative: 2 %
NRBC: 0 % (ref 0.0–0.2)
Neutro Abs: 3.7 10*3/uL (ref 1.7–7.7)
Neutro Abs: 5 10*3/uL (ref 1.7–7.7)
Neutrophils Relative %: 78 %
Neutrophils Relative %: 93 %
Platelets: 23 10*3/uL — CL (ref 150–400)
Platelets: 41 K/uL — ABNORMAL LOW (ref 150–400)
RBC: 3.66 MIL/uL — AB (ref 3.87–5.11)
RBC: 3.65 MIL/uL — ABNORMAL LOW (ref 3.87–5.11)
RDW: 17 % — ABNORMAL HIGH (ref 11.5–15.5)
RDW: 16.9 % — ABNORMAL HIGH (ref 11.5–15.5)
WBC: 4.6 10*3/uL (ref 4.0–10.5)
WBC: 5.5 10*3/uL (ref 4.0–10.5)
nRBC: 0 % (ref 0.0–0.2)

## 2018-07-24 LAB — DIC (DISSEMINATED INTRAVASCULAR COAGULATION)PANEL
D-Dimer, Quant: >20 ug{FEU}/mL — ABNORMAL HIGH (ref 0.00–0.50)
Fibrinogen: 594 mg/dL — ABNORMAL HIGH (ref 210–475)
INR: 1.01
Platelets: 21 10*3/uL — CL (ref 150–400)
Prothrombin Time: 13.2 s (ref 11.4–15.2)
Smear Review: NONE SEEN
aPTT: 31 seconds (ref 24–36)

## 2018-07-24 LAB — BASIC METABOLIC PANEL WITH GFR
Calcium: 7.8 mg/dL — ABNORMAL LOW (ref 8.9–10.3)
GFR calc Af Amer: 29 mL/min — ABNORMAL LOW (ref >60)
GFR calc non Af Amer: 25 mL/min — ABNORMAL LOW (ref >60)
Sodium: 141 mmol/L (ref 135–145)

## 2018-07-24 LAB — BASIC METABOLIC PANEL
Anion gap: 9 (ref 5–15)
BUN: 32 mg/dL — ABNORMAL HIGH (ref 8–23)
CO2: 23 mmol/L (ref 22–32)
Chloride: 109 mmol/L (ref 98–111)
Creatinine, Ser: 1.81 mg/dL — ABNORMAL HIGH (ref 0.44–1.00)
Glucose, Bld: 108 mg/dL — ABNORMAL HIGH (ref 70–99)
Potassium: 3.7 mmol/L (ref 3.5–5.1)

## 2018-07-24 MED ORDER — ALUM & MAG HYDROXIDE-SIMETH 200-200-20 MG/5ML PO SUSP
30.0000 mL | ORAL | Status: DC | PRN
  Administered 2018-07-24: 30 mL via ORAL
  Filled 2018-07-24: qty 30

## 2018-07-24 MED ORDER — CLONIDINE HCL 0.2 MG PO TABS
0.2000 mg | ORAL_TABLET | Freq: Once | ORAL | Status: AC
  Administered 2018-07-24: 0.2 mg via ORAL
  Filled 2018-07-24: qty 1

## 2018-07-24 MED ORDER — HYDRALAZINE HCL 25 MG PO TABS
25.0000 mg | ORAL_TABLET | Freq: Four times a day (QID) | ORAL | Status: DC | PRN
  Filled 2018-07-24: qty 1

## 2018-07-24 MED ORDER — AMLODIPINE BESYLATE 10 MG PO TABS
10.0000 mg | ORAL_TABLET | Freq: Every day | ORAL | Status: DC
  Administered 2018-07-24 – 2018-07-29 (×6): 10 mg via ORAL
  Filled 2018-07-24 (×6): qty 1

## 2018-07-24 MED ORDER — SODIUM CHLORIDE 0.9% IV SOLUTION
Freq: Once | INTRAVENOUS | Status: AC
  Administered 2018-07-24: 10:00:00 via INTRAVENOUS

## 2018-07-24 NOTE — Significant Event (Signed)
Rapid Response Event Note  Overview: Time Called: 0034 Arrival Time: 0040 Event Type: Other (Comment)(hypertension)  Initial Focused Assessment: Patient awake, alert lying in bed. HR 114, BP 184/113 (132). Patient denies headache. Patient states that she has chest pain, but believes it is indigestion related to the milkshake she had earlier tonight. EKG on portable monitor, appears to be ST. Patient currently on continuous telemetry monitoring. Patient has had good urine output. Fluids running at 50 cc/hr. PERRLA, and bilateral grips strong and equal.   Interventions: NP notified of patient's elevated BP despite PRN medication administration. NP ordered oral Catapres x1 to be given now. PRN general admission order set used to order patient PRN Mylanta.   Plan of Care (if not transferred): -RN to recheck patient's BP 1 hour following administration of PO Catapres.  -Patient educated to notify RN if she begins to develop a headache, nausea, chest pain, general pain, or any other discomforts. -RN to consult Rapid Response for further needs.  Event Summary:  Kayla Guerrero

## 2018-07-24 NOTE — Progress Notes (Addendum)
CRITICAL VALUE ALERT  Critical Value: Platelets 21  Date & Time Notied:  07/24/18 0800  Provider Notified: Y  Orders Received/Actions taken: Darreld Mclean

## 2018-07-24 NOTE — Progress Notes (Signed)
2 Days Post-Op  Subjective: Kayla Guerrero is doing well post stenting without pain and her Cr has fallen to 1.81.  Platelet count is quite low at 23K.   She is voiding well but into the [place].  ROS:  Review of Systems  Constitutional: Negative for chills and fever.  Genitourinary: Negative for flank pain and hematuria.    Anti-infectives: Anti-infectives (From admission, onward)   Start     Dose/Rate Route Frequency Ordered Stop   07/23/18 1000  vancomycin (VANCOCIN) IVPB 1000 mg/200 mL premix  Status:  Discontinued     1,000 mg 200 mL/hr over 60 Minutes Intravenous Every 48 hours 07/21/18 1315 07/22/18 0743   07/22/18 0900  cefTRIAXone (ROCEPHIN) 2 g in sodium chloride 0.9 % 100 mL IVPB     2 g 200 mL/hr over 30 Minutes Intravenous Every 24 hours 07/22/18 0746     07/21/18 1000  vancomycin (VANCOCIN) 1,500 mg in sodium chloride 0.9 % 500 mL IVPB     1,500 mg 250 mL/hr over 120 Minutes Intravenous  Once 07/21/18 0759 07/21/18 1215   07/21/18 0900  ceFEPIme (MAXIPIME) 2 g in sodium chloride 0.9 % 100 mL IVPB  Status:  Discontinued     2 g 200 mL/hr over 30 Minutes Intravenous Every 24 hours 07/21/18 0734 07/22/18 0743      Current Facility-Administered Medications  Medication Dose Route Frequency Provider Last Rate Last Dose  . 0.9 %  sodium chloride infusion   Intravenous Continuous Purohit, Shrey C, MD 50 mL/hr at 07/23/18 1600    . acetaminophen (TYLENOL) tablet 650 mg  650 mg Oral Q6H PRN Raynelle Bring, MD   650 mg at 07/20/18 2115   Or  . acetaminophen (TYLENOL) suppository 650 mg  650 mg Rectal Q6H PRN Raynelle Bring, MD      . alum & mag hydroxide-simeth (MAALOX/MYLANTA) 200-200-20 MG/5ML suspension 30 mL  30 mL Oral Q4H PRN Purohit, Konrad Dolores, MD   30 mL at 07/24/18 0115  . cefTRIAXone (ROCEPHIN) 2 g in sodium chloride 0.9 % 100 mL IVPB  2 g Intravenous Q24H Raynelle Bring, MD 200 mL/hr at 07/23/18 1101 2 g at 07/23/18 1101  . dexamethasone (DECADRON) tablet 4 mg  4 mg Oral  Daily Raynelle Bring, MD   4 mg at 07/23/18 1055  . dronabinol (MARINOL) capsule 2.5 mg  2.5 mg Oral BID Kandis Mannan, MD   2.5 mg at 07/23/18 1818  . hydrALAZINE (APRESOLINE) tablet 10 mg  10 mg Oral Q6H PRN Purohit, Konrad Dolores, MD   10 mg at 07/24/18 0530  . HYDROmorphone (DILAUDID) tablet 2 mg  2 mg Oral Q4H PRN Raynelle Bring, MD   2 mg at 07/23/18 0354  . levothyroxine (SYNTHROID, LEVOTHROID) tablet 88 mcg  88 mcg Oral Q0600 Raynelle Bring, MD   88 mcg at 07/24/18 0530  . lidocaine-prilocaine (EMLA) cream   Topical Daily PRN Raynelle Bring, MD      . Melatonin TABS 5 mg  5 mg Oral QHS Raynelle Bring, MD   5 mg at 07/23/18 2210  . metoCLOPramide (REGLAN) tablet 10 mg  10 mg Oral TID Kandis Mannan, MD   10 mg at 07/23/18 1818  . metoprolol tartrate (LOPRESSOR) injection 5 mg  5 mg Intravenous Q5 min PRN Lovey Newcomer T, NP   5 mg at 07/23/18 2326  . ondansetron (ZOFRAN) tablet 8 mg  8 mg Oral Q6H PRN Raynelle Bring, MD   8 mg at 07/23/18 2213  Or  . ondansetron (ZOFRAN) injection 4 mg  4 mg Intravenous Q6H PRN Raynelle Bring, MD   4 mg at 07/23/18 0355  . polyethylene glycol (MIRALAX / GLYCOLAX) packet 17 g  17 g Oral Daily Alvy Bimler, Ni, MD   17 g at 07/23/18 1056  . polyvinyl alcohol (LIQUIFILM TEARS) 1.4 % ophthalmic solution 1 drop  1 drop Both Eyes Daily PRN Raynelle Bring, MD      . sodium chloride flush (NS) 0.9 % injection 10-40 mL  10-40 mL Intracatheter Q12H Raynelle Bring, MD   10 mL at 07/22/18 2125  . sodium chloride flush (NS) 0.9 % injection 10-40 mL  10-40 mL Intracatheter PRN Raynelle Bring, MD      . triamcinolone (NASACORT) nasal inhaler 1 spray  1 spray Nasal Daily Raynelle Bring, MD   1 spray at 07/23/18 1101   Facility-Administered Medications Ordered in Other Encounters  Medication Dose Route Frequency Provider Last Rate Last Dose  . influenza  inactive virus vaccine (FLUZONE/FLUARIX) injection 0.5 mL  0.5 mL Intramuscular Once Rita Ohara, MD          Objective: Vital signs in last 24 hours: Temp:  [97.5 F (36.4 C)-98.8 F (37.1 C)] 98.5 F (36.9 C) (10/26 0509) Pulse Rate:  [84-109] 86 (10/26 0659) Resp:  [16-18] 18 (10/26 0200) BP: (139-218)/(75-117) 175/94 (10/26 0659) SpO2:  [93 %-98 %] 93 % (10/26 0509)  Intake/Output from previous day: 10/25 0701 - 10/26 0700 In: 1827.5 [P.O.:680; I.V.:657.5; Blood:390; IV Piggyback:100] Out: 2675 [Urine:2675] Intake/Output this shift: No intake/output data recorded.   Physical Exam  Constitutional: She appears well-developed and well-nourished.  Vitals reviewed.   Lab Results:  Recent Labs    07/23/18 0346 07/24/18 0425  WBC 4.7 4.6  HGB 7.6* 9.6*  HCT 23.7* 30.4*  PLT 41* 23*   BMET Recent Labs    07/23/18 0346 07/24/18 0425  NA 139  139 141  K 3.3*  3.2* 3.7  CL 109  109 109  CO2 21*  22 23  GLUCOSE 96  98 108*  BUN 41*  42* 32*  CREATININE 2.15*  2.08* 1.81*  CALCIUM 7.6*  7.6* 7.8*   PT/INR No results for input(s): LABPROT, INR in the last 72 hours. ABG No results for input(s): PHART, HCO3 in the last 72 hours.  Invalid input(s): PCO2, PO2  Studies/Results: Dg C-arm 1-60 Min-no Report  Result Date: 07/22/2018 Fluoroscopy was utilized by the requesting physician.  No radiographic interpretation.     Assessment and Plan: AKI with ureteral obstruction.  The creatinine is falling s/p stent insertion.   No need for perc at this time.      LOS: 2 days    Kayla Guerrero 07/24/2018 [phone] ID: Kayla Guerrero, female   DOB: 01/23/1935, 83 y.o.   MRN: [id number]

## 2018-07-24 NOTE — Progress Notes (Signed)
PROGRESS NOTE    Kayla Guerrero  GDJ:[id number] DOB: [birth year] DOA: 07/20/2018 PCP: Rita Ohara, MD   Brief Narrative:  83 year old with past medical history relevant for hypertension, hypothyroidism, widely metastatic endometrial carcinoma with disease to the lungs, abdomen with recent chemotherapy on 07/12/2018, stage III CKD, recent diagnosis of submassive PE and bilateral DVT on enoxaparin who was admitted on 07/20/2018 with profound weakness, intractable nausea, vomiting and found to have E. coli sepsis likely due to post renal obstruction from malignant source status post left ureteral stent placement on 07/22/2018.   Assessment & Plan:   Active Problems:   Chemotherapy induced nausea and vomiting   Anemia   Hyponatremia   Hypothyroidism   PE (pulmonary thromboembolism) (HCC)   Leg DVT (deep venous thromboembolism), acute, bilateral (HCC)   AKI (acute kidney injury) ([place])   #) E. coli bacteremia: Likely from left-sided hydronephrosis. - blood cultures on 07/21/2018 grew out E. coli -Continue IV ceftriaxone 2 g daily 07/22/2018  #) AKI on stage III CKD: Combination of sepsis and post renal.  Improving status post left ureteral stent placed on 07/22/2018.  Patient has known chronic severe right-sided hydronephrosis -IV fluids -Avoid nephrotoxins -Urology following, appreciate recommendations  #) Widely metastatic endometrial carcinoma: Patient is status post 2 cycles of chemotherapy.  -Continue hydromorphone for pain control -Continue dexamethasone for nausea  -oncology following appreciate recommendations, Dr. Simeon Craft such  #) DVT/submassive PE: Patient was recently diagnosed with this.  Likely provoked in the setting of malignancy. -Hold enoxaparin 1 mg/kg -Hold heparin infusion in the setting of thrombocytopenia  #) Anemia/thrombus cytopenia: Likely due to recent chemotherapy. -Supportive care, will restart heparin drip if platelets increase over 50 -We will transfuse  1 unit platelets she is dropped fairly significantly  #) Hypothyroidism: -Continue levothyroxine 88 mcg  #) Pain/psych: -Continue duloxetine 30 mg nightly  #) Hypertension: Patient continues to have episodes of significant hypertension that she is a symptomatic from this. -Restart amlodipine 10 mg -Start hydralazine p.o. 25 mg every 6 hours as needed for systolic blood pressure greater than 180  Fluids: IV fluids Electrodes: Monitor and supplement Nutrition: Regular diet  Prophylaxis: Heparin drip  Disposition: Antibiotic course and decompression of urinary system  Full code    Consultants:   Oncology, Dr. Lottie Rater  Urology  Procedures:  07/22/2018 pending placement of ureteral stents  Antimicrobials:   IV cefepime and vancomycin 07/21/2018 to 07/22/2018  IV ceftriaxone 2 g started 07/22/2018   Subjective: Patient reports that she is doing well today.  She denies any nausea, vomiting, diarrhea.  Her abdominal pain is somewhat improved.  She continues to be quite hypertensive.  Objective: Vitals:   07/24/18 0659 07/24/18 1032 07/24/18 1041 07/24/18 1100  BP: (!) 175/94  (!) 154/81 (!) 173/81  Pulse: 86  (!) 101 94  Resp:   16   Temp:   97.7 F (36.5 C) 97.8 F (36.6 C)  TempSrc:   Oral Oral  SpO2:  98% 96% 97%  Weight:      Height:        Intake/Output Summary (Last 24 hours) at 07/24/2018 1118 Last data filed at 07/24/2018 1008 Gross per 24 hour  Intake 1947.49 ml  Output 2200 ml  Net -252.51 ml   Filed Weights   07/22/18 1456  Weight: 81.6 kg    Examination:  General exam: Appears calm and comfortable  Respiratory system: Clear to auscultation. Respiratory effort normal.  Diminished lung sounds at bases Cardiovascular system: Regular rate and  rhythm, no murmurs Gastrointestinal system: Soft, no tender to deep palpation, no rebound or guarding, plus bowel sounds Central nervous system: Alert and oriented. No focal neurological  deficits. Extremities: 1+ lower extremity edema Skin: Port site is clean dry and intact Psychiatry: Judgement and insight appear normal. Mood & affect appropriate.     Data Reviewed: I have personally reviewed following labs and imaging studies  CBC: Recent Labs  Lab 07/20/18 0915 07/21/18 0346 07/22/18 0054 07/23/18 0346 07/24/18 0425 07/24/18 0810  WBC 8.2 6.4 7.1 4.7 4.6  --   NEUTROABS 7.7  --   --   --  3.7  --   HGB 8.7* 8.1* 8.0* 7.6* 9.6*  --   HCT 27.0* 25.2* 24.6* 23.7* 30.4*  --   MCV 82.6 82.1 81.7 82.6 83.1  --   PLT 82* 66* 61* 41* 23* 21*   Basic Metabolic Panel: Recent Labs  Lab 07/20/18 0915 07/21/18 0346 07/22/18 0054 07/23/18 0346 07/24/18 0425  NA 131* 130* 131* 139  139 141  K 3.8 3.7 3.6 3.3*  3.2* 3.7  CL 96* 97* 101 109  109 109  CO2 24 21* 20* 21*  22 23  GLUCOSE 122* 113* 130* 96  98 108*  BUN 41* 45* 46* 41*  42* 32*  CREATININE 2.66* 2.67* 2.42* 2.15*  2.08* 1.81*  CALCIUM 7.2* 6.7* 7.2* 7.6*  7.6* 7.8*  MG  --   --  1.7  --   --   PHOS  --   --   --  3.6  --    GFR: Estimated Creatinine Clearance: 26.2 mL/min (A) (by C-G formula based on SCr of 1.81 mg/dL (H)). Liver Function Tests: Recent Labs  Lab 07/20/18 0915 07/21/18 0346 07/22/18 0054 07/23/18 0346  AST 27 21 19   --   ALT 13 12 12   --   ALKPHOS 70 62 62  --   BILITOT 0.7 0.8 0.4  --   PROT 6.1* 5.5* 5.4*  --   ALBUMIN 2.8* 2.4* 2.3* 2.2*   No results for input(s): LIPASE, AMYLASE in the last 168 hours. No results for input(s): AMMONIA in the last 168 hours. Coagulation Profile: Recent Labs  Lab 07/24/18 0810  INR 1.01   Cardiac Enzymes: No results for input(s): CKTOTAL, CKMB, CKMBINDEX, TROPONINI in the last 168 hours. BNP (last 3 results) No results for input(s): PROBNP in the last 8760 hours. HbA1C: No results for input(s): HGBA1C in the last 72 hours. CBG: No results for input(s): GLUCAP in the last 168 hours. Lipid Profile: No results for  input(s): CHOL, HDL, LDLCALC, TRIG, CHOLHDL, LDLDIRECT in the last 72 hours. Thyroid Function Tests: No results for input(s): TSH, T4TOTAL, FREET4, T3FREE, THYROIDAB in the last 72 hours. Anemia Panel: No results for input(s): VITAMINB12, FOLATE, FERRITIN, TIBC, IRON, RETICCTPCT in the last 72 hours. Sepsis Labs: Recent Labs  Lab 07/21/18 0813 07/22/18 0054 07/23/18 0346  PROCALCITON 15.85 13.47 6.03    Recent Results (from the past 240 hour(s))  Urine culture     Status: None   Collection Time: 07/14/18  8:04 PM  Result Value Ref Range Status   Specimen Description   Final    URINE, CLEAN CATCH Performed at [facility], [place] [address], [place], [place] [address]    Special Requests   Final    Normal Performed at [facility], [place] [address], [place], [place] [address]    Culture   Final  NO GROWTH Performed at [facility], [place] [address] [place], [place] [address]    Report Status 07/16/2018 FINAL  Final  MRSA PCR Screening     Status: None   Collection Time: 07/15/18  3:00 AM  Result Value Ref Range Status   MRSA by PCR NEGATIVE NEGATIVE Final    Comment:        The GeneXpert MRSA Assay (FDA approved for NASAL specimens only), is one component of a comprehensive MRSA colonization surveillance program. It is not intended to diagnose MRSA infection nor to guide or monitor treatment for MRSA infections. Performed at [facility], [place] [address], [place], [place] [address]   Culture, blood (routine x 2)     Status: Abnormal   Collection Time: 07/21/18  8:13 AM  Result Value Ref Range Status   Specimen Description   Final    BLOOD RIGHT ARM Performed at [facility] [address], [place], [place] [address]    Special Requests   Final    BOTTLES DRAWN AEROBIC AND ANAEROBIC Blood Culture adequate volume Performed at [place] [address]  [place], [place] [address]    Culture  Setup Time   Final    GRAM NEGATIVE RODS AEROBIC BOTTLE ONLY CRITICAL RESULT CALLED TO, READ BACK BY AND VERIFIED WITH: [facility] 07/22/18 0026 JDW Performed at [facility], [place] [address] [place], [place] [address]    Culture ESCHERICHIA COLI (A)  Final   Report Status 07/23/2018 FINAL  Final   Organism ID, Bacteria ESCHERICHIA COLI  Final      Susceptibility   Escherichia coli - MIC*    AMPICILLIN <=2 SENSITIVE Sensitive     CEFAZOLIN <=4 SENSITIVE Sensitive     CEFEPIME <=1 SENSITIVE Sensitive     CEFTAZIDIME <=1 SENSITIVE Sensitive     CEFTRIAXONE <=1 SENSITIVE Sensitive     CIPROFLOXACIN <=0.25 SENSITIVE Sensitive     GENTAMICIN <=1 SENSITIVE Sensitive     IMIPENEM <=0.25 SENSITIVE Sensitive     TRIMETH/SULFA <=20 SENSITIVE Sensitive     AMPICILLIN/SULBACTAM <=2 SENSITIVE Sensitive     PIP/TAZO <=4 SENSITIVE Sensitive     Extended ESBL NEGATIVE Sensitive     * ESCHERICHIA COLI  Blood Culture ID Panel (Reflexed)     Status: Abnormal   Collection Time: 07/21/18  8:13 AM  Result Value Ref Range Status   Enterococcus species NOT DETECTED NOT DETECTED Final   Listeria monocytogenes NOT DETECTED NOT DETECTED Final   Staphylococcus species NOT DETECTED NOT DETECTED Final   Staphylococcus aureus (BCID) NOT DETECTED NOT DETECTED Final   Streptococcus species NOT DETECTED NOT DETECTED Final   Streptococcus agalactiae NOT DETECTED NOT DETECTED Final   Streptococcus pneumoniae NOT DETECTED NOT DETECTED Final   Streptococcus pyogenes NOT DETECTED NOT DETECTED Final   Acinetobacter baumannii NOT DETECTED NOT DETECTED Final   Enterobacteriaceae species DETECTED (A) NOT DETECTED Final    Comment: Enterobacteriaceae represent a large family of gram-negative bacteria, not a single organism. CRITICAL RESULT CALLED TO, READ BACK BY AND VERIFIED WITH: J GRIMSLEY PHARMD 07/22/18 0026 JDW    Enterobacter cloacae complex NOT DETECTED NOT  DETECTED Final   Escherichia coli DETECTED (A) NOT DETECTED Final    Comment: CRITICAL RESULT CALLED TO, READ BACK BY AND VERIFIED WITH: J GRIMSLEY PHARMD  07/22/18 0026 JDW    Klebsiella oxytoca NOT DETECTED NOT DETECTED Final   Klebsiella pneumoniae NOT DETECTED NOT  DETECTED Final   Proteus species NOT DETECTED NOT DETECTED Final   Serratia marcescens NOT DETECTED NOT DETECTED Final   Carbapenem resistance NOT DETECTED NOT DETECTED Final   Haemophilus influenzae NOT DETECTED NOT DETECTED Final   Neisseria meningitidis NOT DETECTED NOT DETECTED Final   Pseudomonas aeruginosa NOT DETECTED NOT DETECTED Final   Candida albicans NOT DETECTED NOT DETECTED Final   Candida glabrata NOT DETECTED NOT DETECTED Final   Candida krusei NOT DETECTED NOT DETECTED Final   Candida parapsilosis NOT DETECTED NOT DETECTED Final   Candida tropicalis NOT DETECTED NOT DETECTED Final    Comment: Performed at [facility], [place] [address] [place], [place] [address]  Culture, blood (routine x 2)     Status: None (Preliminary result)   Collection Time: 07/21/18  8:20 AM  Result Value Ref Range Status   Specimen Description   Final    BLOOD RIGHT WRIST Performed at [facility] [address] [place], [place] [address]    Special Requests   Final    BOTTLES DRAWN AEROBIC AND ANAEROBIC Blood Culture adequate volume Performed at [place] [address] [place], [place] [address]    Culture  Setup Time   Final    GRAM NEGATIVE RODS AEROBIC BOTTLE ONLY CRITICAL VALUE NOTED.  VALUE IS CONSISTENT WITH PREVIOUSLY REPORTED AND CALLED VALUE. Performed at [facility], [place] [address], [place], [place] [address]    Culture GRAM NEGATIVE RODS  Final   Report Status PENDING  Incomplete  Urine culture     Status: Abnormal (Preliminary result)   Collection Time: 07/21/18  8:51 AM  Result Value Ref Range Status   Specimen Description   Final    URINE, CLEAN  CATCH Performed at [facility], [place] [address] [place], [place] [address]    Special Requests   Final    NONE Performed at [facility], [place] [address], [place], [place] [address]    Culture (A)  Final    >=100,000 COLONIES/mL ESCHERICHIA COLI 70,000 COLONIES/mL UNIDENTIFIED ORGANISM Performed at [facility], [place] [address] [place], [place] [address]    Report Status PENDING  Incomplete   Organism ID, Bacteria ESCHERICHIA COLI (A)  Final      Susceptibility   Escherichia coli - MIC*    AMPICILLIN 4 SENSITIVE Sensitive     CEFAZOLIN <=4 SENSITIVE Sensitive     CEFTRIAXONE <=1 SENSITIVE Sensitive     CIPROFLOXACIN <=0.25 SENSITIVE Sensitive     GENTAMICIN <=1 SENSITIVE Sensitive     IMIPENEM <=0.25 SENSITIVE Sensitive     NITROFURANTOIN <=16 SENSITIVE Sensitive     TRIMETH/SULFA <=20 SENSITIVE Sensitive     AMPICILLIN/SULBACTAM <=2 SENSITIVE Sensitive     PIP/TAZO <=4 SENSITIVE Sensitive     Extended ESBL NEGATIVE Sensitive     * >=100,000 COLONIES/mL ESCHERICHIA COLI         Radiology Studies: Dg C-arm 1-60 Min-no Report  Result Date: 07/22/2018 Fluoroscopy was utilized by the requesting physician.  No radiographic interpretation.        Scheduled Meds: . amLODipine  10 mg Oral Daily  . dexamethasone  4 mg Oral Daily  . dronabinol  2.5 mg Oral BID AC  . levothyroxine  88 mcg Oral Q0600  . Melatonin  5 mg Oral QHS  . metoCLOPramide  10 mg Oral TID AC  . polyethylene glycol  17 g Oral Daily  . sodium  chloride flush  10-40 mL Intracatheter Q12H  . triamcinolone  1 spray Nasal Daily   Continuous Infusions: . sodium chloride 50 mL/hr at 07/24/18 1049  . cefTRIAXone (ROCEPHIN)  IV Stopped (07/24/18 0911)     LOS: 2 days    Time spent: [place], MD [facility]  If 7PM-7AM, please contact night-coverage [email or web address] Password [email or web address] 07/24/2018, 11:18 AM

## 2018-07-24 NOTE — Progress Notes (Signed)
CRITICAL VALUE ALERT  Critical Value:  Platelets  23,000  Date & Time Notied: 07/24/18  0556  Provider Notified: Texted to call @ 0559 Texted the critical result @ 0611 to [facility].  Orders Received/Actions taken: Awaiting response from provider

## 2018-07-25 LAB — COMPREHENSIVE METABOLIC PANEL WITH GFR
ALT: 10 U/L (ref 0–44)
AST: 12 U/L — ABNORMAL LOW (ref 15–41)
Albumin: 2.3 g/dL — ABNORMAL LOW (ref 3.5–5.0)
BUN: 27 mg/dL — ABNORMAL HIGH (ref 8–23)
CO2: 25 mmol/L (ref 22–32)
Creatinine, Ser: 1.63 mg/dL — ABNORMAL HIGH (ref 0.44–1.00)
Potassium: 3.4 mmol/L — ABNORMAL LOW (ref 3.5–5.1)
Sodium: 140 mmol/L (ref 135–145)

## 2018-07-25 LAB — COMPREHENSIVE METABOLIC PANEL
Alkaline Phosphatase: 55 U/L (ref 38–126)
Anion gap: 8 (ref 5–15)
Calcium: 7.8 mg/dL — ABNORMAL LOW (ref 8.9–10.3)
Chloride: 107 mmol/L (ref 98–111)
GFR calc Af Amer: 33 mL/min — ABNORMAL LOW (ref >60)
GFR calc non Af Amer: 28 mL/min — ABNORMAL LOW (ref >60)
Glucose, Bld: 98 mg/dL (ref 70–99)
Total Bilirubin: 0.4 mg/dL (ref 0.3–1.2)
Total Protein: 5.4 g/dL — ABNORMAL LOW (ref 6.5–8.1)

## 2018-07-25 LAB — CBC
HCT: 30.3 % — ABNORMAL LOW (ref 36.0–46.0)
Hemoglobin: 9.6 g/dL — ABNORMAL LOW (ref 12.0–15.0)
MCH: 26.1 pg (ref 26.0–34.0)
MCHC: 31.7 g/dL (ref 30.0–36.0)
MCV: 82.3 fL (ref 80.0–100.0)
Platelets: 24 10*3/uL — CL (ref 150–400)
RBC: 3.68 MIL/uL — ABNORMAL LOW (ref 3.87–5.11)
RDW: 17.2 % — ABNORMAL HIGH (ref 11.5–15.5)
WBC: 4.6 K/uL (ref 4.0–10.5)
nRBC: 0 % (ref 0.0–0.2)

## 2018-07-25 MED ORDER — POTASSIUM CHLORIDE CRYS ER 20 MEQ PO TBCR
40.0000 meq | EXTENDED_RELEASE_TABLET | Freq: Once | ORAL | Status: AC
  Administered 2018-07-25: 40 meq via ORAL
  Filled 2018-07-25: qty 2

## 2018-07-25 MED ORDER — VALACYCLOVIR HCL 500 MG PO TABS
1000.0000 mg | ORAL_TABLET | Freq: Every day | ORAL | Status: DC
  Administered 2018-07-25 – 2018-07-29 (×5): 1000 mg via ORAL
  Filled 2018-07-25 (×5): qty 2

## 2018-07-25 MED ORDER — SODIUM CHLORIDE 0.9 % IV SOLN
1.0000 g | Freq: Three times a day (TID) | INTRAVENOUS | Status: DC
  Administered 2018-07-26: 1 g via INTRAVENOUS
  Filled 2018-07-25: qty 1000
  Filled 2018-07-25: qty 1

## 2018-07-25 MED ORDER — HYDRALAZINE HCL 25 MG PO TABS
25.0000 mg | ORAL_TABLET | Freq: Three times a day (TID) | ORAL | Status: DC
  Administered 2018-07-25 – 2018-07-28 (×10): 25 mg via ORAL
  Filled 2018-07-25 (×10): qty 1

## 2018-07-25 NOTE — Progress Notes (Signed)
PROGRESS NOTE    Kayla Guerrero  NWG:[id number] DOB: March 30, 1935 DOA: 07/20/2018 PCP: Rita Ohara, MD   Brief Narrative:  83 year old with past medical history relevant for hypertension, hypothyroidism, widely metastatic endometrial carcinoma with disease to the lungs, abdomen with recent chemotherapy on 07/12/2018, stage III CKD, recent diagnosis of submassive PE and bilateral DVT on enoxaparin who was admitted on 07/20/2018 with profound weakness, intractable nausea, vomiting and found to have E. coli sepsis likely due to post renal obstruction from malignant source status post left ureteral stent placement on 07/22/2018.   Assessment & Plan:   Active Problems:   Chemotherapy induced nausea and vomiting   Anemia   Hyponatremia   Hypothyroidism   PE (pulmonary thromboembolism) (HCC)   Leg DVT (deep venous thromboembolism), acute, bilateral (HCC)   AKI (acute kidney injury) ([facility])   #) E. coli bacteremia: Likely from left-sided hydronephrosis. - blood cultures on 07/21/2018 grew out E. coli -Continue IV ceftriaxone 2 g daily 07/22/2018  #) AKI on stage III CKD: Combination of sepsis and post renal.  Improving status post left ureteral stent placed on 07/22/2018.  Patient has known chronic severe right-sided hydronephrosis -IV fluids -Avoid nephrotoxins -Urology following, appreciate recommendations  #) Widely metastatic endometrial carcinoma: Patient is status post 2 cycles of chemotherapy.  -Continue hydromorphone for pain control -Continue dexamethasone for nausea  -oncology following appreciate recommendations, Dr. Simeon Craft such  #) DVT/submassive PE: Patient was recently diagnosed with this.  Likely provoked in the setting of malignancy. -Hold enoxaparin 1 mg/kg -Hold heparin infusion in the setting of thrombocytopenia  #) Anemia/thrombus cytopenia: Likely due to recent chemotherapy. -Supportive care, will restart heparin drip if platelets increase over 50 -Platelet count  did not improve much after 1 unit packed platelets on 07/18/2018, suspect either suppression from recent chemotherapy versus sepsis versus possible ITP -DIC panel shows no evidence of DIC at this time  #) Hypothyroidism: -Continue levothyroxine 88 mcg  #) Pain/psych: -Continue duloxetine 30 mg nightly  #) Hypertension: Patient continues to have episodes of significant hypertension that is asymptomatic -Continue amlodipine 10 mg -Start hydralazine 25 mg every 8 hours  Fluids: IV fluids Electrodes: Monitor and supplement Nutrition: Regular diet  Prophylaxis: Heparin drip  Disposition: Antibiotic course and improving creatinine  Full code    Consultants:   Oncology, Dr. Lottie Rater  Urology  Procedures:  07/22/2018 pending placement of ureteral stents  Antimicrobials:   IV cefepime and vancomycin 07/21/2018 to 07/22/2018  IV ceftriaxone 2 g started 07/22/2018   Subjective: Patient reports that she is doing well today.  She does not have any complaints.  She denies any nausea, vomiting, diarrhea, abdominal pain, cough, congestion, chest pain.  Objective: Vitals:   07/25/18 0640 07/25/18 0657 07/25/18 0715 07/25/18 0811  BP: (!) 188/102 (!) 172/98 (!) 175/89 (!) 188/81  Pulse:  75    Resp:      Temp:      TempSrc:      SpO2:      Weight:      Height:        Intake/Output Summary (Last 24 hours) at 07/25/2018 0959 Last data filed at 07/25/2018 0857 Gross per 24 hour  Intake 1340.5 ml  Output 1700 ml  Net -359.5 ml   Filed Weights   07/22/18 1456  Weight: 81.6 kg    Examination:  General exam: Appears calm and comfortable  Respiratory system: Clear to auscultation. Respiratory effort normal.  Diminished lung sounds at bases Cardiovascular system: Regular rate and  rhythm, no murmurs Gastrointestinal system: Soft, no tenderness to deep palpation, no rebound or guarding, plus bowel sounds Central nervous system: Alert and oriented. No focal neurological  deficits. Extremities: 1+ lower extremity edema Skin: Port site is clean dry and intact Psychiatry: Judgement and insight appear normal. Mood & affect appropriate.     Data Reviewed: I have personally reviewed following labs and imaging studies  CBC: Recent Labs  Lab 07/20/18 0915  07/22/18 0054 07/23/18 0346 07/24/18 0425 07/24/18 0810 07/24/18 1310 07/25/18 0328  WBC 8.2   < > 7.1 4.7 4.6  --  5.5 4.6  NEUTROABS 7.7  --   --   --  3.7  --  5.0  --   HGB 8.7*   < > 8.0* 7.6* 9.6*  --  9.6* 9.6*  HCT 27.0*   < > 24.6* 23.7* 30.4*  --  30.2* 30.3*  MCV 82.6   < > 81.7 82.6 83.1  --  82.7 82.3  PLT 82*   < > 61* 41* 23* 21* 41* 24*   < > = values in this interval not displayed.   Basic Metabolic Panel: Recent Labs  Lab 07/21/18 0346 07/22/18 0054 07/23/18 0346 07/24/18 0425 07/25/18 0328  NA 130* 131* 139  139 141 140  K 3.7 3.6 3.3*  3.2* 3.7 3.4*  CL 97* 101 109  109 109 107  CO2 21* 20* 21*  22 23 25   GLUCOSE 113* 130* 96  98 108* 98  BUN 45* 46* 41*  42* 32* 27*  CREATININE 2.67* 2.42* 2.15*  2.08* 1.81* 1.63*  CALCIUM 6.7* 7.2* 7.6*  7.6* 7.8* 7.8*  MG  --  1.7  --   --   --   PHOS  --   --  3.6  --   --    GFR: Estimated Creatinine Clearance: 29.1 mL/min (A) (by C-G formula based on SCr of 1.63 mg/dL (H)). Liver Function Tests: Recent Labs  Lab 07/20/18 0915 07/21/18 0346 07/22/18 0054 07/23/18 0346 07/25/18 0328  AST 27 21 19   --  12*  ALT 13 12 12   --  10  ALKPHOS 70 62 62  --  55  BILITOT 0.7 0.8 0.4  --  0.4  PROT 6.1* 5.5* 5.4*  --  5.4*  ALBUMIN 2.8* 2.4* 2.3* 2.2* 2.3*   No results for input(s): LIPASE, AMYLASE in the last 168 hours. No results for input(s): AMMONIA in the last 168 hours. Coagulation Profile: Recent Labs  Lab 07/24/18 0810  INR 1.01   Cardiac Enzymes: No results for input(s): CKTOTAL, CKMB, CKMBINDEX, TROPONINI in the last 168 hours. BNP (last 3 results) No results for input(s): PROBNP in the last 8760  hours. HbA1C: No results for input(s): HGBA1C in the last 72 hours. CBG: No results for input(s): GLUCAP in the last 168 hours. Lipid Profile: No results for input(s): CHOL, HDL, LDLCALC, TRIG, CHOLHDL, LDLDIRECT in the last 72 hours. Thyroid Function Tests: No results for input(s): TSH, T4TOTAL, FREET4, T3FREE, THYROIDAB in the last 72 hours. Anemia Panel: No results for input(s): VITAMINB12, FOLATE, FERRITIN, TIBC, IRON, RETICCTPCT in the last 72 hours. Sepsis Labs: Recent Labs  Lab 07/21/18 0813 07/22/18 0054 07/23/18 0346  PROCALCITON 15.85 13.47 6.03    Recent Results (from the past 240 hour(s))  Culture, blood (routine x 2)     Status: Abnormal   Collection Time: 07/21/18  8:13 AM  Result Value Ref Range Status   Specimen Description   Final  BLOOD RIGHT ARM Performed at [facility], [place] [address] [place], [place] [address]    Special Requests   Final    BOTTLES DRAWN AEROBIC AND ANAEROBIC Blood Culture adequate volume Performed at [place] [address] [place], [place] [address]    Culture  Setup Time   Final    GRAM NEGATIVE RODS AEROBIC BOTTLE ONLY CRITICAL RESULT CALLED TO, READ BACK BY AND VERIFIED WITH: Lavell Luster [facility] 07/22/18 0026 JDW Performed at [facility], [place] [address] [place], [place] [address]    Culture ESCHERICHIA COLI (A)  Final   Report Status 07/23/2018 FINAL  Final   Organism ID, Bacteria ESCHERICHIA COLI  Final      Susceptibility   Escherichia coli - MIC*    AMPICILLIN <=2 SENSITIVE Sensitive     CEFAZOLIN <=4 SENSITIVE Sensitive     CEFEPIME <=1 SENSITIVE Sensitive     CEFTAZIDIME <=1 SENSITIVE Sensitive     CEFTRIAXONE <=1 SENSITIVE Sensitive     CIPROFLOXACIN <=0.25 SENSITIVE Sensitive     GENTAMICIN <=1 SENSITIVE Sensitive     IMIPENEM <=0.25 SENSITIVE Sensitive     TRIMETH/SULFA <=20 SENSITIVE Sensitive     AMPICILLIN/SULBACTAM <=2 SENSITIVE Sensitive     PIP/TAZO <=4  SENSITIVE Sensitive     Extended ESBL NEGATIVE Sensitive     * ESCHERICHIA COLI  Blood Culture ID Panel (Reflexed)     Status: Abnormal   Collection Time: 07/21/18  8:13 AM  Result Value Ref Range Status   Enterococcus species NOT DETECTED NOT DETECTED Final   Listeria monocytogenes NOT DETECTED NOT DETECTED Final   Staphylococcus species NOT DETECTED NOT DETECTED Final   Staphylococcus aureus (BCID) NOT DETECTED NOT DETECTED Final   Streptococcus species NOT DETECTED NOT DETECTED Final   Streptococcus agalactiae NOT DETECTED NOT DETECTED Final   Streptococcus pneumoniae NOT DETECTED NOT DETECTED Final   Streptococcus pyogenes NOT DETECTED NOT DETECTED Final   Acinetobacter baumannii NOT DETECTED NOT DETECTED Final   Enterobacteriaceae species DETECTED (A) NOT DETECTED Final    Comment: Enterobacteriaceae represent a large family of gram-negative bacteria, not a single organism. CRITICAL RESULT CALLED TO, READ BACK BY AND VERIFIED WITH: J GRIMSLEY PHARMD 07/22/18 0026 JDW    Enterobacter cloacae complex NOT DETECTED NOT DETECTED Final   Escherichia coli DETECTED (A) NOT DETECTED Final    Comment: CRITICAL RESULT CALLED TO, READ BACK BY AND VERIFIED WITH: J GRIMSLEY PHARMD  07/22/18 0026 JDW    Klebsiella oxytoca NOT DETECTED NOT DETECTED Final   Klebsiella pneumoniae NOT DETECTED NOT DETECTED Final   Proteus species NOT DETECTED NOT DETECTED Final   Serratia marcescens NOT DETECTED NOT DETECTED Final   Carbapenem resistance NOT DETECTED NOT DETECTED Final   Haemophilus influenzae NOT DETECTED NOT DETECTED Final   Neisseria meningitidis NOT DETECTED NOT DETECTED Final   Pseudomonas aeruginosa NOT DETECTED NOT DETECTED Final   Candida albicans NOT DETECTED NOT DETECTED Final   Candida glabrata NOT DETECTED NOT DETECTED Final   Candida krusei NOT DETECTED NOT DETECTED Final   Candida parapsilosis NOT DETECTED NOT DETECTED Final   Candida tropicalis NOT DETECTED NOT DETECTED Final     Comment: Performed at [facility], [place] [address], [place], [place] [address]  Culture, blood (routine x 2)     Status: Abnormal   Collection Time: 07/21/18  8:20 AM  Result Value Ref Range Status   Specimen Description   Final  BLOOD RIGHT WRIST Performed at [place] [address] [place], [place] [address]    Special Requests   Final    BOTTLES DRAWN AEROBIC AND ANAEROBIC Blood Culture adequate volume Performed at [place] [address], [place], [place] [address]    Culture  Setup Time   Final    GRAM NEGATIVE RODS AEROBIC BOTTLE ONLY CRITICAL VALUE NOTED.  VALUE IS CONSISTENT WITH PREVIOUSLY REPORTED AND CALLED VALUE.    Culture (A)  Final    ESCHERICHIA COLI SUSCEPTIBILITIES PERFORMED ON PREVIOUS CULTURE WITHIN THE LAST 5 DAYS. Performed at [facility], [place] [address] [place], [place] [address]    Report Status 07/25/2018 FINAL  Final  Urine culture     Status: Abnormal (Preliminary result)   Collection Time: 07/21/18  8:51 AM  Result Value Ref Range Status   Specimen Description   Final    URINE, CLEAN CATCH Performed at [facility], [place] [address] [place], [place] [address]    Special Requests   Final    NONE Performed at [facility], [place] [address], [place],  [address]    Culture (A)  Final    >=100,000 COLONIES/mL ESCHERICHIA COLI 70,000 COLONIES/mL UNIDENTIFIED ORGANISM CULTURE REINCUBATED FOR BETTER GROWTH Performed at [facility], [place] [address], [place], [place] [address]    Report Status PENDING  Incomplete   Organism ID, Bacteria ESCHERICHIA COLI (A)  Final      Susceptibility   Escherichia coli - MIC*    AMPICILLIN 4 SENSITIVE Sensitive     CEFAZOLIN <=4 SENSITIVE Sensitive     CEFTRIAXONE <=1 SENSITIVE Sensitive     CIPROFLOXACIN <=0.25 SENSITIVE Sensitive     GENTAMICIN <=1 SENSITIVE Sensitive     IMIPENEM <=0.25 SENSITIVE  Sensitive     NITROFURANTOIN <=16 SENSITIVE Sensitive     TRIMETH/SULFA <=20 SENSITIVE Sensitive     AMPICILLIN/SULBACTAM <=2 SENSITIVE Sensitive     PIP/TAZO <=4 SENSITIVE Sensitive     Extended ESBL NEGATIVE Sensitive     * >=100,000 COLONIES/mL ESCHERICHIA COLI         Radiology Studies: No results found.      Scheduled Meds: . amLODipine  10 mg Oral Daily  . dexamethasone  4 mg Oral Daily  . dronabinol  2.5 mg Oral BID AC  . hydrALAZINE  25 mg Oral Q8H  . levothyroxine  88 mcg Oral Q0600  . Melatonin  5 mg Oral QHS  . metoCLOPramide  10 mg Oral TID AC  . polyethylene glycol  17 g Oral Daily  . sodium chloride flush  10-40 mL Intracatheter Q12H  . triamcinolone  1 spray Nasal Daily   Continuous Infusions: . sodium chloride 50 mL/hr at 07/25/18 0637  . cefTRIAXone (ROCEPHIN)  IV 2 g (07/25/18 0956)     LOS: 3 days    Time spent: [place], MD [facility]  If 7PM-7AM, please contact night-coverage [email or web address] Password [email or web address] 07/25/2018, 9:59 AM

## 2018-07-25 NOTE — Progress Notes (Signed)
3 Days Post-Op  Subjective: Kayla Guerrero is doing well post stenting without pain and her Cr has fallen further to 1.63.   She is voiding well but frequently.  ROS:  Review of Systems  Constitutional: Negative for fever.  Genitourinary: Negative for flank pain.    Anti-infectives: Anti-infectives (From admission, onward)   Start     Dose/Rate Route Frequency Ordered Stop   07/23/18 1000  vancomycin (VANCOCIN) IVPB 1000 mg/200 mL premix  Status:  Discontinued     1,000 mg 200 mL/hr over 60 Minutes Intravenous Every 48 hours 07/21/18 1315 07/22/18 0743   07/22/18 0900  cefTRIAXone (ROCEPHIN) 2 g in sodium chloride 0.9 % 100 mL IVPB     2 g 200 mL/hr over 30 Minutes Intravenous Every 24 hours 07/22/18 0746     07/21/18 1000  vancomycin (VANCOCIN) 1,500 mg in sodium chloride 0.9 % 500 mL IVPB     1,500 mg 250 mL/hr over 120 Minutes Intravenous  Once 07/21/18 0759 07/21/18 1215   07/21/18 0900  ceFEPIme (MAXIPIME) 2 g in sodium chloride 0.9 % 100 mL IVPB  Status:  Discontinued     2 g 200 mL/hr over 30 Minutes Intravenous Every 24 hours 07/21/18 0734 07/22/18 0743      Current Facility-Administered Medications  Medication Dose Route Frequency Provider Last Rate Last Dose  . 0.9 %  sodium chloride infusion   Intravenous Continuous Purohit, Konrad Dolores, MD 50 mL/hr at 07/25/18 [phone]    . acetaminophen (TYLENOL) tablet 650 mg  650 mg Oral Q6H PRN Raynelle Bring, MD   650 mg at 07/20/18 2115   Or  . acetaminophen (TYLENOL) suppository 650 mg  650 mg Rectal Q6H PRN Raynelle Bring, MD      . alum & mag hydroxide-simeth (MAALOX/MYLANTA) 200-200-20 MG/5ML suspension 30 mL  30 mL Oral Q4H PRN Purohit, Konrad Dolores, MD   30 mL at 07/24/18 0115  . amLODipine (NORVASC) tablet 10 mg  10 mg Oral Daily Purohit, Shrey C, MD   10 mg at 07/24/18 1017  . cefTRIAXone (ROCEPHIN) 2 g in sodium chloride 0.9 % 100 mL IVPB  2 g Intravenous Q24H Raynelle Bring, MD   Stopped at 07/24/18 9024  . dexamethasone (DECADRON) tablet  4 mg  4 mg Oral Daily Raynelle Bring, MD   4 mg at 07/24/18 1017  . dronabinol (MARINOL) capsule 2.5 mg  2.5 mg Oral BID Kandis Mannan, MD   2.5 mg at 07/24/18 1720  . hydrALAZINE (APRESOLINE) tablet 25 mg  25 mg Oral Q8H Purohit, Shrey C, MD      . HYDROmorphone (DILAUDID) tablet 2 mg  2 mg Oral Q4H PRN Raynelle Bring, MD   2 mg at 07/23/18 0354  . levothyroxine (SYNTHROID, LEVOTHROID) tablet 88 mcg  88 mcg Oral Q0600 Raynelle Bring, MD   88 mcg at 07/25/18 0636  . lidocaine-prilocaine (EMLA) cream   Topical Daily PRN Raynelle Bring, MD      . Melatonin TABS 5 mg  5 mg Oral QHS Raynelle Bring, MD   5 mg at 07/24/18 2053  . metoCLOPramide (REGLAN) tablet 10 mg  10 mg Oral TID Kandis Mannan, MD   10 mg at 07/25/18 0973  . ondansetron (ZOFRAN) tablet 8 mg  8 mg Oral Q6H PRN Raynelle Bring, MD   8 mg at 07/23/18 2213   Or  . ondansetron [facility]) injection 4 mg  4 mg Intravenous Q6H PRN Raynelle Bring, MD   4 mg at 07/23/18  0355  . polyethylene glycol (MIRALAX / GLYCOLAX) packet 17 g  17 g Oral Daily Alvy Bimler, Ni, MD   17 g at 07/24/18 1017  . polyvinyl alcohol (LIQUIFILM TEARS) 1.4 % ophthalmic solution 1 drop  1 drop Both Eyes Daily PRN Raynelle Bring, MD      . sodium chloride flush (NS) 0.9 % injection 10-40 mL  10-40 mL Intracatheter Q12H Raynelle Bring, MD   10 mL at 07/22/18 2125  . sodium chloride flush (NS) 0.9 % injection 10-40 mL  10-40 mL Intracatheter PRN Raynelle Bring, MD      . triamcinolone (NASACORT) nasal inhaler 1 spray  1 spray Nasal Daily Raynelle Bring, MD   1 spray at 07/24/18 1017   Facility-Administered Medications Ordered in Other Encounters  Medication Dose Route Frequency Provider Last Rate Last Dose  . influenza  inactive virus vaccine (FLUZONE/FLUARIX) injection 0.5 mL  0.5 mL Intramuscular Once Rita Ohara, MD         Objective: Vital signs in last 24 hours: Temp:  [97.7 F (36.5 C)-98.8 F (37.1 C)] 98.2 F (36.8 C) (10/27 0441) Pulse Rate:  [75-102] 75  (10/27 0657) Resp:  [14-18] 18 (10/27 0441) BP: (135-188)/(81-102) 188/81 (10/27 0811) SpO2:  [90 %-98 %] 96 % (10/27 0441)  Intake/Output from previous day: 10/26 0701 - 10/27 0700 In: 1440.5 [P.O.:420; I.V.:614.5; Blood:306; IV Piggyback:100] Out: 1500 [Urine:1500] Intake/Output this shift: Total I/O In: -  Out: 200 [Urine:200]   Physical Exam  Constitutional: She appears well-developed and well-nourished.  Vitals reviewed.   Lab Results:  Recent Labs    07/24/18 1310 07/25/18 0328  WBC 5.5 4.6  HGB 9.6* 9.6*  HCT 30.2* 30.3*  PLT 41* 24*   BMET Recent Labs    07/24/18 0425 07/25/18 0328  NA 141 140  K 3.7 3.4*  CL 109 107  CO2 23 25  GLUCOSE 108* 98  BUN 32* 27*  CREATININE 1.81* 1.63*  CALCIUM 7.8* 7.8*   PT/INR Recent Labs    07/24/18 0810  LABPROT 13.2  INR 1.01   ABG No results for input(s): PHART, HCO3 in the last 72 hours.  Invalid input(s): PCO2, PO2  Studies/Results: No results found.   Assessment and Plan: AKI with ureteral obstruction.  Creatinine continues to fall.        LOS: 3 days    Kayla Guerrero 07/25/2018 [phone] ID: Kayla Guerrero, female   DOB: 1934-10-04, 83 y.o.   MRN: [id number]

## 2018-07-26 DIAGNOSIS — K5909 Other constipation: Secondary | ICD-10-CM

## 2018-07-26 DIAGNOSIS — E440 Moderate protein-calorie malnutrition: Secondary | ICD-10-CM

## 2018-07-26 DIAGNOSIS — C7800 Secondary malignant neoplasm of unspecified lung: Secondary | ICD-10-CM

## 2018-07-26 LAB — BASIC METABOLIC PANEL WITH GFR
Anion gap: 9 (ref 5–15)
CO2: 26 mmol/L (ref 22–32)
Calcium: 7.8 mg/dL — ABNORMAL LOW (ref 8.9–10.3)
Creatinine, Ser: 1.46 mg/dL — ABNORMAL HIGH (ref 0.44–1.00)
Glucose, Bld: 105 mg/dL — ABNORMAL HIGH (ref 70–99)

## 2018-07-26 LAB — TYPE AND SCREEN
ABO/RH(D): A POS
ANTIBODY SCREEN: NEGATIVE
UNIT DIVISION: 0

## 2018-07-26 LAB — BPAM RBC
Blood Product Expiration Date: 201911222359
ISSUE DATE / TIME: 201910251154
Unit Type and Rh: 6200

## 2018-07-26 LAB — BPAM PLATELET PHERESIS
Blood Product Expiration Date: 201910262359
ISSUE DATE / TIME: 201910261040
Unit Type and Rh: 600

## 2018-07-26 LAB — URINE CULTURE

## 2018-07-26 LAB — BASIC METABOLIC PANEL
BUN: 24 mg/dL — ABNORMAL HIGH (ref 8–23)
Chloride: 107 mmol/L (ref 98–111)
GFR calc Af Amer: 38 mL/min — ABNORMAL LOW (ref >60)
GFR calc non Af Amer: 33 mL/min — ABNORMAL LOW (ref >60)
Potassium: 3.7 mmol/L (ref 3.5–5.1)
Sodium: 142 mmol/L (ref 135–145)

## 2018-07-26 LAB — PREPARE PLATELET PHERESIS: Unit division: 0

## 2018-07-26 LAB — CBC
HCT: 29 % — ABNORMAL LOW (ref 36.0–46.0)
Hemoglobin: 9.3 g/dL — ABNORMAL LOW (ref 12.0–15.0)
MCH: 26.3 pg (ref 26.0–34.0)
MCHC: 32.1 g/dL (ref 30.0–36.0)
MCV: 82.2 fL (ref 80.0–100.0)
Platelets: 18 K/uL — CL (ref 150–400)
RBC: 3.53 MIL/uL — ABNORMAL LOW (ref 3.87–5.11)
RDW: 17.2 % — ABNORMAL HIGH (ref 11.5–15.5)
WBC: 3.5 10*3/uL — ABNORMAL LOW (ref 4.0–10.5)
nRBC: 0 % (ref 0.0–0.2)

## 2018-07-26 MED ORDER — SODIUM CHLORIDE 0.9 % IV SOLN
2.0000 g | Freq: Four times a day (QID) | INTRAVENOUS | Status: DC
  Administered 2018-07-26 – 2018-07-29 (×12): 2 g via INTRAVENOUS
  Filled 2018-07-26 (×4): qty 2
  Filled 2018-07-26: qty 2000
  Filled 2018-07-26 (×3): qty 2
  Filled 2018-07-26: qty 2000
  Filled 2018-07-26 (×2): qty 2
  Filled 2018-07-26: qty 2000
  Filled 2018-07-26 (×2): qty 2

## 2018-07-26 NOTE — Progress Notes (Signed)
PROGRESS NOTE    Kayla Guerrero  WIO:[id number] DOB: 1935/08/19 DOA: 07/20/2018 PCP: Rita Ohara, MD   Brief Narrative:  83 year old with past medical history relevant for hypertension, hypothyroidism, widely metastatic endometrial carcinoma with disease to the lungs, abdomen with recent chemotherapy on 07/12/2018, stage III CKD, recent diagnosis of submassive PE and bilateral DVT on enoxaparin who was admitted on 07/20/2018 with profound weakness, intractable nausea, vomiting and found to have E. coli sepsis likely due to post renal obstruction from malignant source status post left ureteral stent placement on 07/22/2018.   Assessment & Plan:   Active Problems:   Chemotherapy induced nausea and vomiting   Anemia   Hyponatremia   Hypothyroidism   PE (pulmonary thromboembolism) (HCC)   Leg DVT (deep venous thromboembolism), acute, bilateral (HCC)   AKI (acute kidney injury) ([place])   #) E. coli bacteremia: Likely from left-sided hydronephrosis. - blood cultures on 07/21/2018 grew out E. coli -Discontinue IV ceftriaxone 2 g daily 07/22/2018 to 07/25/2018 -Continue IV ampicillin started 07/26/2018  #) AKI on stage III CKD:  Improving status post left ureteral stent placed on 07/22/2018.  Patient has known chronic severe right-sided hydronephrosis -Discontinue IV fluids -Avoid nephrotoxins -Urology following, appreciate recommendations  #) Anemia/thrombocytopenia: Likely due to recent chemotherapy with contributions from sepsis.  DIC panel shows no clear evidence of DIC at this time.  Interestingly enough platelet count did not improve much after 1 unit of packed platelets on 07/24/2018. -Supportive care, will restart heparin drip if platelets increase over 50 -We will discuss with oncology about possible ITP -DIC panel shows no evidence of DIC at this time  #) Widely metastatic endometrial carcinoma: Patient is status post 2 cycles of chemotherapy.  -Continue hydromorphone for pain  control -Continue dexamethasone for nausea  -oncology following appreciate recommendations, Dr. Simeon Craft such  #) DVT/submassive PE: Patient was recently diagnosed with this.  Likely provoked in the setting of malignancy. -Hold enoxaparin 1 mg/kg -Hold heparin infusion in the setting of thrombocytopenia  #) Hypothyroidism: -Continue levothyroxine 88 mcg  #) Pain/psych: -Continue duloxetine 30 mg nightly  #) Hypertension: Patient continues to have episodes of significant hypertension that is asymptomatic -Continue amlodipine 10 mg -Start hydralazine 25 mg every 8 hours  Fluids: Already n.p.o. Electrodes: Monitor and supplement Nutrition: Regular diet  Prophylaxis: Heparin drip and thrombocytopenia  Disposition: Improving platelets  Full code    Consultants:   Oncology, Dr. Lottie Rater  Urology  Procedures:  07/22/2018 pending placement of ureteral stents  Antimicrobials:   IV cefepime and vancomycin 07/21/2018 to 07/22/2018  IV ceftriaxone 2 g started 07/22/2018 to 07/25/2018  IV ampicillin started 07/18/2018   Subjective: Patient reports that she is doing well today.  She feels much better.  She is regularly to get up and walk.  Unfortunately her platelets are down and so she knows she has to stay.  She is complaining about lower extremity edema but she denies any abdominal pain, nausea, vomiting, diarrhea, chest pain.  Objective: Vitals:   07/25/18 0811 07/25/18 1322 07/25/18 2020 07/26/18 0420  BP: (!) 188/81 134/76 (!) 148/89 (!) 164/88  Pulse:  (!) 101 94 98  Resp:  (!) 22 16 17   Temp:  98.3 F (36.8 C) 98 F (36.7 C) 98.3 F (36.8 C)  TempSrc:  Oral Oral Oral  SpO2:  96% 96% 96%  Weight:      Height:        Intake/Output Summary (Last 24 hours) at 07/26/2018 1040 Last data filed at  07/26/2018 0756 Gross per 24 hour  Intake 1343.46 ml  Output 2450 ml  Net -1106.54 ml   Filed Weights   07/22/18 1456  Weight: 81.6 kg    Examination:  General  exam: Appears calm and comfortable  Respiratory system: Clear to auscultation. Respiratory effort normal.  Diminished lung sounds at bases Cardiovascular system: Regular rate and rhythm, no murmurs Gastrointestinal system: Soft, no tenderness to deep palpation, no rebound or guarding, plus bowel sounds Central nervous system: Alert and oriented. No focal neurological deficits. Extremities: 1+ lower extremity edema Skin: Port site is clean dry and intact Psychiatry: Judgement and insight appear normal. Mood & affect appropriate.     Data Reviewed: I have personally reviewed following labs and imaging studies  CBC: Recent Labs  Lab 07/20/18 0915  07/23/18 0346 07/24/18 0425 07/24/18 0810 07/24/18 1310 07/25/18 0328 07/26/18 0314  WBC 8.2   < > 4.7 4.6  --  5.5 4.6 3.5*  NEUTROABS 7.7  --   --  3.7  --  5.0  --   --   HGB 8.7*   < > 7.6* 9.6*  --  9.6* 9.6* 9.3*  HCT 27.0*   < > 23.7* 30.4*  --  30.2* 30.3* 29.0*  MCV 82.6   < > 82.6 83.1  --  82.7 82.3 82.2  PLT 82*   < > 41* 23* 21* 41* 24* 18*   < > = values in this interval not displayed.   Basic Metabolic Panel: Recent Labs  Lab 07/22/18 0054 07/23/18 0346 07/24/18 0425 07/25/18 0328 07/26/18 0314  NA 131* 139  139 141 140 142  K 3.6 3.3*  3.2* 3.7 3.4* 3.7  CL 101 109  109 109 107 107  CO2 20* 21*  22 23 25 26   GLUCOSE 130* 96  98 108* 98 105*  BUN 46* 41*  42* 32* 27* 24*  CREATININE 2.42* 2.15*  2.08* 1.81* 1.63* 1.46*  CALCIUM 7.2* 7.6*  7.6* 7.8* 7.8* 7.8*  MG 1.7  --   --   --   --   PHOS  --  3.6  --   --   --    GFR: Estimated Creatinine Clearance: 32.5 mL/min (A) (by C-G formula based on SCr of 1.46 mg/dL (H)). Liver Function Tests: Recent Labs  Lab 07/20/18 0915 07/21/18 0346 07/22/18 0054 07/23/18 0346 07/25/18 0328  AST 27 21 19   --  12*  ALT 13 12 12   --  10  ALKPHOS 70 62 62  --  55  BILITOT 0.7 0.8 0.4  --  0.4  PROT 6.1* 5.5* 5.4*  --  5.4*  ALBUMIN 2.8* 2.4* 2.3* 2.2* 2.3*    No results for input(s): LIPASE, AMYLASE in the last 168 hours. No results for input(s): AMMONIA in the last 168 hours. Coagulation Profile: Recent Labs  Lab 07/24/18 0810  INR 1.01   Cardiac Enzymes: No results for input(s): CKTOTAL, CKMB, CKMBINDEX, TROPONINI in the last 168 hours. BNP (last 3 results) No results for input(s): PROBNP in the last 8760 hours. HbA1C: No results for input(s): HGBA1C in the last 72 hours. CBG: No results for input(s): GLUCAP in the last 168 hours. Lipid Profile: No results for input(s): CHOL, HDL, LDLCALC, TRIG, CHOLHDL, LDLDIRECT in the last 72 hours. Thyroid Function Tests: No results for input(s): TSH, T4TOTAL, FREET4, T3FREE, THYROIDAB in the last 72 hours. Anemia Panel: No results for input(s): VITAMINB12, FOLATE, FERRITIN, TIBC, IRON, RETICCTPCT in the last 72  hours. Sepsis Labs: Recent Labs  Lab 07/21/18 0813 07/22/18 0054 07/23/18 0346  PROCALCITON 15.85 13.47 6.03    Recent Results (from the past 240 hour(s))  Culture, blood (routine x 2)     Status: Abnormal   Collection Time: 07/21/18  8:13 AM  Result Value Ref Range Status   Specimen Description   Final    BLOOD RIGHT ARM Performed at [facility] [address] [place], [place] [address]    Special Requests   Final    BOTTLES DRAWN AEROBIC AND ANAEROBIC Blood Culture adequate volume Performed at [facility] [address] [place], [place] [address]    Culture  Setup Time   Final    GRAM NEGATIVE RODS AEROBIC BOTTLE ONLY CRITICAL RESULT CALLED TO, READ BACK BY AND VERIFIED WITH: Lavell Luster [facility] 07/22/18 0026 JDW Performed at [facility], [place] [address] [place], [place] [address]    Culture ESCHERICHIA COLI (A)  Final   Report Status 07/23/2018 FINAL  Final   Organism ID, Bacteria ESCHERICHIA COLI  Final      Susceptibility   Escherichia coli - MIC*    AMPICILLIN <=2 SENSITIVE Sensitive     CEFAZOLIN <=4 SENSITIVE  Sensitive     CEFEPIME <=1 SENSITIVE Sensitive     CEFTAZIDIME <=1 SENSITIVE Sensitive     CEFTRIAXONE <=1 SENSITIVE Sensitive     CIPROFLOXACIN <=0.25 SENSITIVE Sensitive     GENTAMICIN <=1 SENSITIVE Sensitive     IMIPENEM <=0.25 SENSITIVE Sensitive     TRIMETH/SULFA <=20 SENSITIVE Sensitive     AMPICILLIN/SULBACTAM <=2 SENSITIVE Sensitive     PIP/TAZO <=4 SENSITIVE Sensitive     Extended ESBL NEGATIVE Sensitive     * ESCHERICHIA COLI  Blood Culture ID Panel (Reflexed)     Status: Abnormal   Collection Time: 07/21/18  8:13 AM  Result Value Ref Range Status   Enterococcus species NOT DETECTED NOT DETECTED Final   Listeria monocytogenes NOT DETECTED NOT DETECTED Final   Staphylococcus species NOT DETECTED NOT DETECTED Final   Staphylococcus aureus (BCID) NOT DETECTED NOT DETECTED Final   Streptococcus species NOT DETECTED NOT DETECTED Final   Streptococcus agalactiae NOT DETECTED NOT DETECTED Final   Streptococcus pneumoniae NOT DETECTED NOT DETECTED Final   Streptococcus pyogenes NOT DETECTED NOT DETECTED Final   Acinetobacter baumannii NOT DETECTED NOT DETECTED Final   Enterobacteriaceae species DETECTED (A) NOT DETECTED Final    Comment: Enterobacteriaceae represent a large family of gram-negative bacteria, not a single organism. CRITICAL RESULT CALLED TO, READ BACK BY AND VERIFIED WITH: J GRIMSLEY PHARMD 07/22/18 0026 JDW    Enterobacter cloacae complex NOT DETECTED NOT DETECTED Final   Escherichia coli DETECTED (A) NOT DETECTED Final    Comment: CRITICAL RESULT CALLED TO, READ BACK BY AND VERIFIED WITH: J GRIMSLEY PHARMD  07/22/18 0026 JDW    Klebsiella oxytoca NOT DETECTED NOT DETECTED Final   Klebsiella pneumoniae NOT DETECTED NOT DETECTED Final   Proteus species NOT DETECTED NOT DETECTED Final   Serratia marcescens NOT DETECTED NOT DETECTED Final   Carbapenem resistance NOT DETECTED NOT DETECTED Final   Haemophilus influenzae NOT DETECTED NOT DETECTED Final   Neisseria  meningitidis NOT DETECTED NOT DETECTED Final   Pseudomonas aeruginosa NOT DETECTED NOT DETECTED Final   Candida albicans NOT DETECTED NOT DETECTED Final   Candida glabrata NOT DETECTED NOT DETECTED Final   Candida krusei NOT DETECTED NOT DETECTED Final   Candida parapsilosis NOT DETECTED NOT  DETECTED Final   Candida tropicalis NOT DETECTED NOT DETECTED Final    Comment: Performed at [facility], [place] [address], [place], [place] [address]  Culture, blood (routine x 2)     Status: Abnormal (Preliminary result)   Collection Time: 07/21/18  8:20 AM  Result Value Ref Range Status   Specimen Description   Final    BLOOD RIGHT WRIST Performed at [facility] [address], [place], [place] [address]    Special Requests   Final    BOTTLES DRAWN AEROBIC AND ANAEROBIC Blood Culture adequate volume Performed at [facility] [address] [place], [place] [address]    Culture  Setup Time   Final    GRAM NEGATIVE RODS IN BOTH AEROBIC AND ANAEROBIC BOTTLES CRITICAL VALUE NOTED.  VALUE IS CONSISTENT WITH PREVIOUSLY REPORTED AND CALLED VALUE.    Culture (A)  Final    ESCHERICHIA COLI SUSCEPTIBILITIES PERFORMED ON PREVIOUS CULTURE WITHIN THE LAST 5 DAYS. Performed at [facility], [place] [address], [place], [place] [address]    Report Status PENDING  Incomplete  Urine culture     Status: Abnormal   Collection Time: 07/21/18  8:51 AM  Result Value Ref Range Status   Specimen Description   Final    URINE, CLEAN CATCH Performed at [facility], [place] [address], [place], [place] [address]    Special Requests   Final    NONE Performed at [facility] [place] [address], [place], [place] [address]    Culture (A)  Final    >=100,000 COLONIES/mL ESCHERICHIA COLI WITHIN MIXED ORGANISMS Performed at [facility], [place] [address], [place], [place] [address]    Report Status 07/26/2018 FINAL  Final    Organism ID, Bacteria ESCHERICHIA COLI (A)  Final      Susceptibility   Escherichia coli - MIC*    AMPICILLIN 4 SENSITIVE Sensitive     CEFAZOLIN <=4 SENSITIVE Sensitive     CEFTRIAXONE <=1 SENSITIVE Sensitive     CIPROFLOXACIN <=0.25 SENSITIVE Sensitive     GENTAMICIN <=1 SENSITIVE Sensitive     IMIPENEM <=0.25 SENSITIVE Sensitive     NITROFURANTOIN <=16 SENSITIVE Sensitive     TRIMETH/SULFA <=20 SENSITIVE Sensitive     AMPICILLIN/SULBACTAM <=2 SENSITIVE Sensitive     PIP/TAZO <=4 SENSITIVE Sensitive     Extended ESBL NEGATIVE Sensitive     * >=100,000 COLONIES/mL ESCHERICHIA COLI         Radiology Studies: No results found.      Scheduled Meds: . amLODipine  10 mg Oral Daily  . dexamethasone  4 mg Oral Daily  . dronabinol  2.5 mg Oral BID AC  . hydrALAZINE  25 mg Oral Q8H  . levothyroxine  88 mcg Oral Q0600  . Melatonin  5 mg Oral QHS  . metoCLOPramide  10 mg Oral TID AC  . polyethylene glycol  17 g Oral Daily  . sodium chloride flush  10-40 mL Intracatheter Q12H  . triamcinolone  1 spray Nasal Daily  . valACYclovir  1,000 mg Oral Daily   Continuous Infusions: . sodium chloride 50 mL/hr at 07/26/18 0600  . ampicillin (OMNIPEN) IV Stopped (07/26/18 0555)     LOS: 4 days    Time spent: [place], MD [facility]  If 7PM-7AM, please contact night-coverage [email or web address] Password [email or web address] 07/26/2018, 10:40 AM

## 2018-07-26 NOTE — Progress Notes (Signed)
CRITICAL VALUE ALERT  Critical Value:  Plt 18  Date & Time Notied:  07/26/18  Provider Notified: Kennon Holter, NP  Orders Received/Actions taken: No new orders given at this time.

## 2018-07-26 NOTE — Progress Notes (Signed)
Physical Therapy Treatment Patient Details Name: Kayla Guerrero MRN: [id number] DOB: May 07, 1935 Today's Date: 07/26/2018    History of Present Illness 83 yo female admitted with AKI, pancytopenia, weakness. S/P L ureteral stent placement 10/24. Hx of L TKA, L foot drop, neuropathy, endometrial ca with mets, PE, DVT    PT Comments    Progressing with mobility. Pt remains weak. Encouraged her to perform some LE exercises while sitting in the recliner. Also encouraged her to ask nursing to walk with her again later today if possible. Will continue to follow.     Follow Up Recommendations  Home health PT;Supervision/Assistance - 24 hour     Equipment Recommendations  None recommended by PT    Recommendations for Other Services       Precautions / Restrictions Precautions Precautions: Fall Restrictions Weight Bearing Restrictions: No    Mobility  Bed Mobility               General bed mobility comments: oob in recliner  Transfers Overall transfer level: Needs assistance Equipment used: None;Rolling walker (2 wheeled) Transfers: Sit to/from Stand Sit to Stand: Min assist         General transfer comment: Assist to rise, stabilize, control descent. VCs safety, hand placement. Increased assist needed to rise from low toilet.   Ambulation/Gait Ambulation/Gait assistance: Min guard Gait Distance (Feet): 85 Feet Assistive device: Rolling walker (2 wheeled) Gait Pattern/deviations: Step-through pattern;Trunk flexed     General Gait Details: Close guard for safety. Pt is requiring RW for safety, stability at this time.    Stairs             Wheelchair Mobility    Modified Rankin (Stroke Patients Only)       Balance Overall balance assessment: Needs assistance         Standing balance support: Bilateral upper extremity supported Standing balance-Leahy Scale: Poor                              Cognition Arousal/Alertness:  Awake/alert Behavior During Therapy: WFL for tasks assessed/performed Overall Cognitive Status: Within Functional Limits for tasks assessed                                        Exercises      General Comments        Pertinent Vitals/Pain Pain Assessment: No/denies pain    Home Living                      Prior Function            PT Goals (current goals can now be found in the care plan section) Progress towards PT goals: Progressing toward goals    Frequency    Min 3X/week      PT Plan Current plan remains appropriate    Co-evaluation              AM-PAC PT "6 Clicks" Daily Activity  Outcome Measure  Difficulty turning over in bed (including adjusting bedclothes, sheets and blankets)?: A Lot Difficulty moving from lying on back to sitting on the side of the bed? : A Lot Difficulty sitting down on and standing up from a chair with arms (e.g., wheelchair, bedside commode, etc,.)?: Unable Help needed moving to and from a bed to chair (including a  wheelchair)?: A Little Help needed walking in hospital room?: A Little Help needed climbing 3-5 steps with a railing? : A Lot 6 Click Score: 13    End of Session Equipment Utilized During Treatment: Gait belt Activity Tolerance: Patient tolerated treatment well Patient left: in chair;with call bell/phone within reach;with chair alarm set   PT Visit Diagnosis: Muscle weakness (generalized) (M62.81);Difficulty in walking, not elsewhere classified (R26.2)     Time: 1120-1130 PT Time Calculation (min) (ACUTE ONLY): 10 min  Charges:  $Gait Training: 8-22 mins                       Weston Anna, PT Acute Rehabilitation Services Pager: [phone] Office: [phone]

## 2018-07-26 NOTE — Progress Notes (Signed)
4 Days Post-Op Subjective: Patient reports that she is feeling fine, a bit stronger as well.  No recent flank pain, fevers or chills.  Objective: Vital signs in last 24 hours: Temp:  [98 F (36.7 C)-98.3 F (36.8 C)] 98.3 F (36.8 C) (10/28 0420) Pulse Rate:  [94-101] 98 (10/28 0420) Resp:  [16-22] 17 (10/28 0420) BP: (134-164)/(76-89) 164/88 (10/28 0420) SpO2:  [96 %] 96 % (10/28 0420)  Intake/Output from previous day: 10/27 0701 - 10/28 0700 In: 923.5 [P.O.:240; I.V.:583.5; IV Piggyback:100] Out: 2250 [Urine:2250] Intake/Output this shift: Total I/O In: 420 [P.O.:420] Out: 400 [Urine:400]  Physical Exam:  Constitutional: Vital signs reviewed. WD WN in NAD   Eyes: PERRL, No scleral icterus.   Cardiovascular: RRR Pulmonary/Chest: Normal effort   Lab Results: Recent Labs    07/24/18 1310 07/25/18 0328 07/26/18 0314  HGB 9.6* 9.6* 9.3*  HCT 30.2* 30.3* 29.0*   BMET Recent Labs    07/25/18 0328 07/26/18 0314  NA 140 142  K 3.4* 3.7  CL 107 107  CO2 25 26  GLUCOSE 98 105*  BUN 27* 24*  CREATININE 1.63* 1.46*  CALCIUM 7.8* 7.8*   Recent Labs    07/24/18 0810  INR 1.01   No results for input(s): LABURIN in the last 72 hours. Results for orders placed or performed during the hospital encounter of 07/20/18  Culture, blood (routine x 2)     Status: Abnormal   Collection Time: 07/21/18  8:13 AM  Result Value Ref Range Status   Specimen Description   Final    BLOOD RIGHT ARM Performed at [facility] [address] [place], [place] [address]    Special Requests   Final    BOTTLES DRAWN AEROBIC AND ANAEROBIC Blood Culture adequate volume Performed at [facility] [address], [place], [place] [address]    Culture  Setup Time   Final    GRAM NEGATIVE RODS AEROBIC BOTTLE ONLY CRITICAL RESULT CALLED TO, READ BACK BY AND VERIFIED WITH: Lavell Luster [facility] 07/22/18 0026 JDW Performed at [facility], [place] [address] [place], [place] [address]    Culture ESCHERICHIA COLI (A)  Final   Report Status 07/23/2018 FINAL  Final   Organism ID, Bacteria ESCHERICHIA COLI  Final      Susceptibility   Escherichia coli - MIC*    AMPICILLIN <=2 SENSITIVE Sensitive     CEFAZOLIN <=4 SENSITIVE Sensitive     CEFEPIME <=1 SENSITIVE Sensitive     CEFTAZIDIME <=1 SENSITIVE Sensitive     CEFTRIAXONE <=1 SENSITIVE Sensitive     CIPROFLOXACIN <=0.25 SENSITIVE Sensitive     GENTAMICIN <=1 SENSITIVE Sensitive     IMIPENEM <=0.25 SENSITIVE Sensitive     TRIMETH/SULFA <=20 SENSITIVE Sensitive     AMPICILLIN/SULBACTAM <=2 SENSITIVE Sensitive     PIP/TAZO <=4 SENSITIVE Sensitive     Extended ESBL NEGATIVE Sensitive     * ESCHERICHIA COLI  Blood Culture ID Panel (Reflexed)     Status: Abnormal   Collection Time: 07/21/18  8:13 AM  Result Value Ref Range Status   Enterococcus species NOT DETECTED NOT DETECTED Final   Listeria monocytogenes NOT DETECTED NOT DETECTED Final   Staphylococcus species NOT DETECTED NOT DETECTED Final   Staphylococcus aureus (BCID) NOT DETECTED NOT DETECTED Final   Streptococcus species NOT DETECTED NOT DETECTED Final   Streptococcus agalactiae NOT DETECTED NOT DETECTED Final   Streptococcus pneumoniae NOT DETECTED NOT DETECTED Final  Streptococcus pyogenes NOT DETECTED NOT DETECTED Final   Acinetobacter baumannii NOT DETECTED NOT DETECTED Final   Enterobacteriaceae species DETECTED (A) NOT DETECTED Final    Comment: Enterobacteriaceae represent a large family of gram-negative bacteria, not a single organism. CRITICAL RESULT CALLED TO, READ BACK BY AND VERIFIED WITH: J GRIMSLEY PHARMD 07/22/18 0026 JDW    Enterobacter cloacae complex NOT DETECTED NOT DETECTED Final   Escherichia coli DETECTED (A) NOT DETECTED Final    Comment: CRITICAL RESULT CALLED TO, READ BACK BY AND VERIFIED WITH: J GRIMSLEY PHARMD  07/22/18 0026 JDW    Klebsiella oxytoca NOT DETECTED NOT DETECTED Final    Klebsiella pneumoniae NOT DETECTED NOT DETECTED Final   Proteus species NOT DETECTED NOT DETECTED Final   Serratia marcescens NOT DETECTED NOT DETECTED Final   Carbapenem resistance NOT DETECTED NOT DETECTED Final   Haemophilus influenzae NOT DETECTED NOT DETECTED Final   Neisseria meningitidis NOT DETECTED NOT DETECTED Final   Pseudomonas aeruginosa NOT DETECTED NOT DETECTED Final   Candida albicans NOT DETECTED NOT DETECTED Final   Candida glabrata NOT DETECTED NOT DETECTED Final   Candida krusei NOT DETECTED NOT DETECTED Final   Candida parapsilosis NOT DETECTED NOT DETECTED Final   Candida tropicalis NOT DETECTED NOT DETECTED Final    Comment: Performed at [facility], [place] [address] [place], [place] [address]  Culture, blood (routine x 2)     Status: Abnormal (Preliminary result)   Collection Time: 07/21/18  8:20 AM  Result Value Ref Range Status   Specimen Description   Final    BLOOD RIGHT WRIST Performed at [facility] [address] [place], [place] [address]    Special Requests   Final    BOTTLES DRAWN AEROBIC AND ANAEROBIC Blood Culture adequate volume Performed at [facility] [address], [place], [place] [address]    Culture  Setup Time   Final    GRAM NEGATIVE RODS IN BOTH AEROBIC AND ANAEROBIC BOTTLES CRITICAL VALUE NOTED.  VALUE IS CONSISTENT WITH PREVIOUSLY REPORTED AND CALLED VALUE.    Culture (A)  Final    ESCHERICHIA COLI SUSCEPTIBILITIES PERFORMED ON PREVIOUS CULTURE WITHIN THE LAST 5 DAYS. Performed at [facility], [place] [address], [place], [place] [address]    Report Status PENDING  Incomplete  Urine culture     Status: Abnormal   Collection Time: 07/21/18  8:51 AM  Result Value Ref Range Status   Specimen Description   Final    URINE, CLEAN CATCH Performed at [facility], [place] [address], [place], [place] [address]    Special Requests   Final    NONE Performed at [facility], [place] [address], [place], [place] [address]    Culture (A)  Final    >=100,000 COLONIES/mL ESCHERICHIA COLI WITHIN MIXED ORGANISMS Performed at [facility], [place] [address] [place], [place] [address]    Report Status 07/26/2018 FINAL  Final   Organism ID, Bacteria ESCHERICHIA COLI (A)  Final      Susceptibility   Escherichia coli - MIC*    AMPICILLIN 4 SENSITIVE Sensitive     CEFAZOLIN <=4 SENSITIVE Sensitive     CEFTRIAXONE <=1 SENSITIVE Sensitive     CIPROFLOXACIN <=0.25 SENSITIVE Sensitive     GENTAMICIN <=1 SENSITIVE Sensitive     IMIPENEM <=0.25 SENSITIVE Sensitive     NITROFURANTOIN <=16 SENSITIVE Sensitive     TRIMETH/SULFA <=20 SENSITIVE Sensitive  AMPICILLIN/SULBACTAM <=2 SENSITIVE Sensitive     PIP/TAZO <=4 SENSITIVE Sensitive     Extended ESBL NEGATIVE Sensitive     * >=100,000 COLONIES/mL ESCHERICHIA COLI    Studies/Results: No results found.  Assessment/Plan:   Bilateral hydronephrosis, malignant source.  Status post stenting of left kidney, right ureter totally obstructed.  She seems to be doing well without drainage of her right system.  At this point, we will proceed with just left-sided stent.  If she develops right flank pain, recurrent fever, consider percutaneous nephrostomy tube.  I think she is fine to leave the hospital tomorrow from a urologic standpoint.  We will call to set up a proper follow-up.   LOS: 4 days   Kayla Guerrero Kayla Guerrero 07/26/2018, 9:10 AM

## 2018-07-26 NOTE — Progress Notes (Signed)
Kayla Guerrero   DOB:Jun 10, 1935   VF#:[id number]    Assessment & Plan:   Metastatic uterine cancer CT showed stability/shrinkage of abdominal disease Even though the lung mass is bigger, with necrotic appearance, I felt that she might have positive response overall Continue supportive care  Acute renal failure, improving Could be either due to third spacing, sepsis, worsening disease in her abdomen or recent exposure to contrastand hydronephrosis.  She had recent stent placement.  Continue supportive care and close monitoring  Recent PE I recommend holding off heparin in view of ongoing bleeding and platelet count less than 50,000 We will check blood count tomorrow.  If platelet improved to greater than 50,000, okay to resume anticoagulation therapy  Acquired pancytopenia She has received blood transfusion last week. No need platelet transfusion unless platelet count is less than 10,000  Fever, resolved Blood culture was positive for E. coli, likely due to urinary tract infection She will continue antibiotic therapy  Moderate to severe protein calorie malnutrition Due to third spacing and poor oral intake She needs dietitian consult  Progressive weakness Due to recurrent hospitalization She may benefit from PT consult; was evaluated last week.  Encourage ambulation  Other constipation, resolved She will continue laxatives as needed  Discharge planning She is too weak to be discharged and not ready to be discharged due to progressive pancytopenia I will return to check on her tomorrow  [name], MD 07/26/2018  8:06 AM   Subjective:  She is doing well over the weekend.  She complained of leg swelling.  Her appetite is improved.  Denies constipation.  Denies shortness of breath or fever. The patient denies any recent signs or symptoms of bleeding such as spontaneous epistaxis, hematuria or hematochezia.   Objective:  Vitals:   07/25/18 2020 07/26/18 0420   BP: (!) 148/89 (!) 164/88  Pulse: 94 98  Resp: 16 17  Temp: 98 F (36.7 C) 98.3 F (36.8 C)  SpO2: 96% 96%     Intake/Output Summary (Last 24 hours) at 07/26/2018 0806 Last data filed at 07/26/2018 0756 Gross per 24 hour  Intake 1343.46 ml  Output 2650 ml  Net -1306.54 ml    GENERAL:alert, no distress and comfortable SKIN: skin color, texture, turgor are normal, no rashes or significant lesions EYES: normal, Conjunctiva are pink and non-injected, sclera clear OROPHARYNX:no exudate, no erythema and lips, buccal mucosa, and tongue normal. Noted skin blister near left upper lip, ?herpes simplex NECK: supple, thyroid normal size, non-tender, without nodularity LYMPH:  no palpable lymphadenopathy in the cervical, axillary or inguinal LUNGS: clear to auscultation and percussion with normal breathing effort HEART: regular rate & rhythm and no murmurs and no lower extremity edema ABDOMEN:abdomen soft, non-tender and normal bowel sounds Musculoskeletal:no cyanosis of digits and no clubbing  NEURO: alert & oriented x 3 with fluent speech, no focal motor/sensory deficits   Labs:  Lab Results  Component Value Date   WBC 3.5 (L) 07/26/2018   HGB 9.3 (L) 07/26/2018   HCT 29.0 (L) 07/26/2018   MCV 82.2 07/26/2018   PLT 18 (LL) 07/26/2018   NEUTROABS 5.0 07/24/2018    Lab Results  Component Value Date   NA 142 07/26/2018   K 3.7 07/26/2018   CL 107 07/26/2018   CO2 26 07/26/2018    Studies:  No results found.

## 2018-07-26 NOTE — Progress Notes (Signed)
PHARMACY NOTE:  ANTIMICROBIAL RENAL DOSAGE ADJUSTMENT  Current antimicrobial regimen includes a mismatch between antimicrobial dosage and estimated renal function.  As per policy approved by the Pharmacy & Therapeutics and Medical Executive Committees, the antimicrobial dosage will be adjusted accordingly.  Current antimicrobial dosage:  Ampicillin 1gm IV q8h  Indication: bacteremia and UTI  Renal Function:  Estimated Creatinine Clearance: 32.5 mL/min (A) (by C-G formula based on SCr of 1.46 mg/dL (H)). []      On intermittent HD, scheduled: []      On CRRT    Antimicrobial dosage has been changed to:  Ampicillin 2gm IV q6h   Thank you for allowing pharmacy to be a part of this patient's care.  Lynelle Doctor, [facility] 07/26/2018 11:06 AM

## 2018-07-26 NOTE — Care Management Important Message (Signed)
Important Message  Patient Details  Name: VERLIN DUKE MRN: [id number] Date of Birth: Feb 20, 1935   Medicare Important Message Given:  Yes    Kerin Salen 07/26/2018, 11:48 AMImportant Message  Patient Details  Name: MARYLON VERNO MRN: [id number] Date of Birth: 1935-01-10   Medicare Important Message Given:  Yes    Kerin Salen 07/26/2018, 11:48 AM

## 2018-07-27 ENCOUNTER — Other Ambulatory Visit: Payer: Self-pay | Admitting: Hematology and Oncology

## 2018-07-27 LAB — CBC
HCT: 28.7 % — ABNORMAL LOW (ref 36.0–46.0)
Hemoglobin: 9.2 g/dL — ABNORMAL LOW (ref 12.0–15.0)
MCH: 26.6 pg (ref 26.0–34.0)
MCHC: 32.1 g/dL (ref 30.0–36.0)
MCV: 82.9 fL (ref 80.0–100.0)
Platelets: 26 10*3/uL — CL (ref 150–400)
RBC: 3.46 MIL/uL — ABNORMAL LOW (ref 3.87–5.11)
RDW: 17.8 % — ABNORMAL HIGH (ref 11.5–15.5)
WBC: 3.7 K/uL — ABNORMAL LOW (ref 4.0–10.5)
nRBC: 0 % (ref 0.0–0.2)

## 2018-07-27 LAB — CULTURE, BLOOD (ROUTINE X 2): Special Requests: ADEQUATE

## 2018-07-27 LAB — COMPREHENSIVE METABOLIC PANEL WITH GFR
BUN: 24 mg/dL — ABNORMAL HIGH (ref 8–23)
CO2: 24 mmol/L (ref 22–32)
Potassium: 3.6 mmol/L (ref 3.5–5.1)
Total Bilirubin: 0.6 mg/dL (ref 0.3–1.2)
Total Protein: 5.6 g/dL — ABNORMAL LOW (ref 6.5–8.1)

## 2018-07-27 LAB — COMPREHENSIVE METABOLIC PANEL
ALT: 11 U/L (ref 0–44)
AST: 14 U/L — ABNORMAL LOW (ref 15–41)
Albumin: 2.8 g/dL — ABNORMAL LOW (ref 3.5–5.0)
Alkaline Phosphatase: 51 U/L (ref 38–126)
Anion gap: 10 (ref 5–15)
Calcium: 8.2 mg/dL — ABNORMAL LOW (ref 8.9–10.3)
Chloride: 106 mmol/L (ref 98–111)
Creatinine, Ser: 1.45 mg/dL — ABNORMAL HIGH (ref 0.44–1.00)
GFR calc Af Amer: 38 mL/min — ABNORMAL LOW (ref >60)
GFR calc non Af Amer: 33 mL/min — ABNORMAL LOW (ref >60)
Glucose, Bld: 85 mg/dL (ref 70–99)
Sodium: 140 mmol/L (ref 135–145)

## 2018-07-27 LAB — MAGNESIUM: Magnesium: 1.6 mg/dL — ABNORMAL LOW (ref 1.7–2.4)

## 2018-07-27 MED ORDER — MAGNESIUM SULFATE 2 GM/50ML IV SOLN
2.0000 g | Freq: Once | INTRAVENOUS | Status: AC
  Administered 2018-07-27: 2 g via INTRAVENOUS
  Filled 2018-07-27: qty 50

## 2018-07-27 NOTE — Progress Notes (Signed)
PROGRESS NOTE    Kayla Guerrero  PXT:[id number] DOB: 1935/07/11 DOA: 07/20/2018 PCP: Rita Ohara, MD   Brief Narrative:  83 year old with past medical history relevant for hypertension, hypothyroidism, widely metastatic endometrial carcinoma with disease to the lungs, abdomen with recent chemotherapy on 07/12/2018, stage III CKD, recent diagnosis of submassive PE and bilateral DVT on enoxaparin who was admitted on 07/20/2018 with profound weakness, intractable nausea, vomiting and found to have E. coli sepsis likely due to post renal obstruction from malignant source status post left ureteral stent placement on 07/22/2018.   Assessment & Plan:   Active Problems:   Chemotherapy induced nausea and vomiting   Anemia   Hyponatremia   Hypothyroidism   PE (pulmonary thromboembolism) (HCC)   Leg DVT (deep venous thromboembolism), acute, bilateral (HCC)   AKI (acute kidney injury) ([place])   #) E. coli bacteremia: Likely from left-sided hydronephrosis. - blood cultures on 07/21/2018 grew out E. coli -Continue IV ampicillin started 07/26/2018  #) AKI on stage III CKD: Resolved status post left ureteral stent placed on 07/22/2018.  Patient has known chronic severe right-sided hydronephrosis -Avoid nephrotoxins -Urology following, appreciate recommendations  #) Anemia/thrombocytopenia: Platelet count his reach nadir and is now slowly going up.  Per hematology/oncology note they would prefer to discharge patient once her platelet count is above 50,000 -Supportive care, will restart heparin drip if platelets increase over 50 -DIC panel shows no evidence of DIC at this time, of note patient received platelet transfusion without any improvement in platelets on 07/24/2018  #) Widely metastatic endometrial carcinoma: Patient is status post 2 cycles of chemotherapy.  -Continue hydromorphone for pain control -Continue dexamethasone for nausea  -oncology following appreciate recommendations, Dr. Simeon Craft  such  #) DVT/submassive PE: Patient was recently diagnosed with this.  Likely provoked in the setting of malignancy. -Hold enoxaparin 1 mg/kg -Hold heparin infusion in the setting of thrombocytopenia  #) Hypothyroidism: -Continue levothyroxine 88 mcg  #) Pain/psych: -Continue duloxetine 30 mg nightly  #) Hypertension: Patient continues to have episodes of significant hypertension that is asymptomatic -Continue amlodipine 10 mg -Continue hydralazine 25 mg every 8 hours  Fluids: Already n.p.o. Electrodes: Monitor and supplement Nutrition: Regular diet  Prophylaxis: Heparin drip and thrombocytopenia  Disposition: Improving platelets  Full code    Consultants:   Oncology, Dr. Lottie Rater  Urology  Procedures:  07/22/2018 pending placement of ureteral stents  Antimicrobials:   IV cefepime and vancomycin 07/21/2018 to 07/22/2018  IV ceftriaxone 2 g started 07/22/2018 to 07/25/2018  IV ampicillin started 07/18/2018   Subjective: Patient reports that she is doing well today.  She only feels frustrated because she has to stay in the hospital she feels well.  She denies any nausea, vomiting, diarrhea, cough, congestion, rhinorrhea.  She continues to report lower extremity edema.  Objective: Vitals:   07/26/18 1359 07/26/18 2021 07/27/18 0445 07/27/18 0511  BP: (!) 142/79 129/72 (!) 163/83 (!) 166/83  Pulse: (!) 105 93 96   Resp: 18 18 17    Temp: 97.7 F (36.5 C) 98.5 F (36.9 C) 98.4 F (36.9 C)   TempSrc: Oral Oral Oral   SpO2: 96% 95% 96%   Weight:      Height:        Intake/Output Summary (Last 24 hours) at 07/27/2018 1036 Last data filed at 07/27/2018 0835 Gross per 24 hour  Intake 1198.17 ml  Output -  Net 1198.17 ml   Filed Weights   07/22/18 1456  Weight: 81.6 kg  Examination:  General exam: Appears calm and comfortable  Respiratory system: Clear to auscultation. Respiratory effort normal.  Diminished lung sounds at bases Cardiovascular  system: Regular rate and rhythm, no murmurs Gastrointestinal system: Soft, no tenderness to deep palpation, no rebound or guarding, plus bowel sounds Central nervous system: Alert and oriented. No focal neurological deficits. Extremities: 1+ lower extremity edema Skin: Port site is clean dry and intact Psychiatry: Judgement and insight appear normal. Mood & affect appropriate.     Data Reviewed: I have personally reviewed following labs and imaging studies  CBC: Recent Labs  Lab 07/24/18 0425 07/24/18 0810 07/24/18 1310 07/25/18 0328 07/26/18 0314 07/27/18 0450  WBC 4.6  --  5.5 4.6 3.5* 3.7*  NEUTROABS 3.7  --  5.0  --   --   --   HGB 9.6*  --  9.6* 9.6* 9.3* 9.2*  HCT 30.4*  --  30.2* 30.3* 29.0* 28.7*  MCV 83.1  --  82.7 82.3 82.2 82.9  PLT 23* 21* 41* 24* 18* 26*   Basic Metabolic Panel: Recent Labs  Lab 07/22/18 0054 07/23/18 0346 07/24/18 0425 07/25/18 0328 07/26/18 0314 07/27/18 0450  NA 131* 139  139 141 140 142 140  K 3.6 3.3*  3.2* 3.7 3.4* 3.7 3.6  CL 101 109  109 109 107 107 106  CO2 20* 21*  22 23 25 26 24   GLUCOSE 130* 96  98 108* 98 105* 85  BUN 46* 41*  42* 32* 27* 24* 24*  CREATININE 2.42* 2.15*  2.08* 1.81* 1.63* 1.46* 1.45*  CALCIUM 7.2* 7.6*  7.6* 7.8* 7.8* 7.8* 8.2*  MG 1.7  --   --   --   --  1.6*  PHOS  --  3.6  --   --   --   --    GFR: Estimated Creatinine Clearance: 32.8 mL/min (A) (by C-G formula based on SCr of 1.45 mg/dL (H)). Liver Function Tests: Recent Labs  Lab 07/21/18 0346 07/22/18 0054 07/23/18 0346 07/25/18 0328 07/27/18 0450  AST 21 19  --  12* 14*  ALT 12 12  --  10 11  ALKPHOS 62 62  --  55 51  BILITOT 0.8 0.4  --  0.4 0.6  PROT 5.5* 5.4*  --  5.4* 5.6*  ALBUMIN 2.4* 2.3* 2.2* 2.3* 2.8*   No results for input(s): LIPASE, AMYLASE in the last 168 hours. No results for input(s): AMMONIA in the last 168 hours. Coagulation Profile: Recent Labs  Lab 07/24/18 0810  INR 1.01   Cardiac Enzymes: No results  for input(s): CKTOTAL, CKMB, CKMBINDEX, TROPONINI in the last 168 hours. BNP (last 3 results) No results for input(s): PROBNP in the last 8760 hours. HbA1C: No results for input(s): HGBA1C in the last 72 hours. CBG: No results for input(s): GLUCAP in the last 168 hours. Lipid Profile: No results for input(s): CHOL, HDL, LDLCALC, TRIG, CHOLHDL, LDLDIRECT in the last 72 hours. Thyroid Function Tests: No results for input(s): TSH, T4TOTAL, FREET4, T3FREE, THYROIDAB in the last 72 hours. Anemia Panel: No results for input(s): VITAMINB12, FOLATE, FERRITIN, TIBC, IRON, RETICCTPCT in the last 72 hours. Sepsis Labs: Recent Labs  Lab 07/21/18 0813 07/22/18 0054 07/23/18 0346  PROCALCITON 15.85 13.47 6.03    Recent Results (from the past 240 hour(s))  Culture, blood (routine x 2)     Status: Abnormal   Collection Time: 07/21/18  8:13 AM  Result Value Ref Range Status   Specimen Description   Final  BLOOD RIGHT ARM Performed at [facility], [place] [address] [place], [place] [address]    Special Requests   Final    BOTTLES DRAWN AEROBIC AND ANAEROBIC Blood Culture adequate volume Performed at [place] [address] [place], [place] [address]    Culture  Setup Time   Final    GRAM NEGATIVE RODS AEROBIC BOTTLE ONLY CRITICAL RESULT CALLED TO, READ BACK BY AND VERIFIED WITH: Lavell Luster [facility] 07/22/18 0026 JDW Performed at [facility], [place] [address], [place], [place] [address]    Culture ESCHERICHIA COLI (A)  Final   Report Status 07/23/2018 FINAL  Final   Organism ID, Bacteria ESCHERICHIA COLI  Final      Susceptibility   Escherichia coli - MIC*    AMPICILLIN <=2 SENSITIVE Sensitive     CEFAZOLIN <=4 SENSITIVE Sensitive     CEFEPIME <=1 SENSITIVE Sensitive     CEFTAZIDIME <=1 SENSITIVE Sensitive     CEFTRIAXONE <=1 SENSITIVE Sensitive     CIPROFLOXACIN <=0.25 SENSITIVE Sensitive     GENTAMICIN <=1 SENSITIVE Sensitive      IMIPENEM <=0.25 SENSITIVE Sensitive     TRIMETH/SULFA <=20 SENSITIVE Sensitive     AMPICILLIN/SULBACTAM <=2 SENSITIVE Sensitive     PIP/TAZO <=4 SENSITIVE Sensitive     Extended ESBL NEGATIVE Sensitive     * ESCHERICHIA COLI  Blood Culture ID Panel (Reflexed)     Status: Abnormal   Collection Time: 07/21/18  8:13 AM  Result Value Ref Range Status   Enterococcus species NOT DETECTED NOT DETECTED Final   Listeria monocytogenes NOT DETECTED NOT DETECTED Final   Staphylococcus species NOT DETECTED NOT DETECTED Final   Staphylococcus aureus (BCID) NOT DETECTED NOT DETECTED Final   Streptococcus species NOT DETECTED NOT DETECTED Final   Streptococcus agalactiae NOT DETECTED NOT DETECTED Final   Streptococcus pneumoniae NOT DETECTED NOT DETECTED Final   Streptococcus pyogenes NOT DETECTED NOT DETECTED Final   Acinetobacter baumannii NOT DETECTED NOT DETECTED Final   Enterobacteriaceae species DETECTED (A) NOT DETECTED Final    Comment: Enterobacteriaceae represent a large family of gram-negative bacteria, not a single organism. CRITICAL RESULT CALLED TO, READ BACK BY AND VERIFIED WITH: J GRIMSLEY PHARMD 07/22/18 0026 JDW    Enterobacter cloacae complex NOT DETECTED NOT DETECTED Final   Escherichia coli DETECTED (A) NOT DETECTED Final    Comment: CRITICAL RESULT CALLED TO, READ BACK BY AND VERIFIED WITH: J GRIMSLEY PHARMD  07/22/18 0026 JDW    Klebsiella oxytoca NOT DETECTED NOT DETECTED Final   Klebsiella pneumoniae NOT DETECTED NOT DETECTED Final   Proteus species NOT DETECTED NOT DETECTED Final   Serratia marcescens NOT DETECTED NOT DETECTED Final   Carbapenem resistance NOT DETECTED NOT DETECTED Final   Haemophilus influenzae NOT DETECTED NOT DETECTED Final   Neisseria meningitidis NOT DETECTED NOT DETECTED Final   Pseudomonas aeruginosa NOT DETECTED NOT DETECTED Final   Candida albicans NOT DETECTED NOT DETECTED Final   Candida glabrata NOT DETECTED NOT DETECTED Final   Candida  krusei NOT DETECTED NOT DETECTED Final   Candida parapsilosis NOT DETECTED NOT DETECTED Final   Candida tropicalis NOT DETECTED NOT DETECTED Final    Comment: Performed at [facility], [place] [address], [place], [place] [address]  Culture, blood (routine x 2)     Status: Abnormal   Collection Time: 07/21/18  8:20 AM  Result Value Ref Range Status   Specimen Description   Final  BLOOD RIGHT WRIST Performed at [place] [address], [place], [place] [address]    Special Requests   Final    BOTTLES DRAWN AEROBIC AND ANAEROBIC Blood Culture adequate volume Performed at [place] [address], [place], [place] [address]    Culture  Setup Time   Final    GRAM NEGATIVE RODS IN BOTH AEROBIC AND ANAEROBIC BOTTLES CRITICAL VALUE NOTED.  VALUE IS CONSISTENT WITH PREVIOUSLY REPORTED AND CALLED VALUE.    Culture (A)  Final    ESCHERICHIA COLI SUSCEPTIBILITIES PERFORMED ON PREVIOUS CULTURE WITHIN THE LAST 5 DAYS. Performed at [facility], [place] [address], [place], [place] [address]    Report Status 07/27/2018 FINAL  Final  Urine culture     Status: Abnormal   Collection Time: 07/21/18  8:51 AM  Result Value Ref Range Status   Specimen Description   Final    URINE, CLEAN CATCH Performed at [facility], [place] [address] [place], [place] [address]    Special Requests   Final    NONE Performed at [facility], [place] [address], [place], [place] [address]    Culture (A)  Final    >=100,000 COLONIES/mL ESCHERICHIA COLI WITHIN MIXED ORGANISMS Performed at [facility], [place] [address], [place],  [address]    Report Status 07/26/2018 FINAL  Final   Organism ID, Bacteria ESCHERICHIA COLI (A)  Final      Susceptibility   Escherichia coli - MIC*    AMPICILLIN 4 SENSITIVE Sensitive     CEFAZOLIN <=4 SENSITIVE Sensitive     CEFTRIAXONE <=1 SENSITIVE Sensitive     CIPROFLOXACIN <=0.25  SENSITIVE Sensitive     GENTAMICIN <=1 SENSITIVE Sensitive     IMIPENEM <=0.25 SENSITIVE Sensitive     NITROFURANTOIN <=16 SENSITIVE Sensitive     TRIMETH/SULFA <=20 SENSITIVE Sensitive     AMPICILLIN/SULBACTAM <=2 SENSITIVE Sensitive     PIP/TAZO <=4 SENSITIVE Sensitive     Extended ESBL NEGATIVE Sensitive     * >=100,000 COLONIES/mL ESCHERICHIA COLI         Radiology Studies: No results found.      Scheduled Meds: . amLODipine  10 mg Oral Daily  . dronabinol  2.5 mg Oral BID AC  . hydrALAZINE  25 mg Oral Q8H  . levothyroxine  88 mcg Oral Q0600  . Melatonin  5 mg Oral QHS  . metoCLOPramide  10 mg Oral TID AC  . polyethylene glycol  17 g Oral Daily  . sodium chloride flush  10-40 mL Intracatheter Q12H  . triamcinolone  1 spray Nasal Daily  . valACYclovir  1,000 mg Oral Daily   Continuous Infusions: . ampicillin (OMNIPEN) IV Stopped (07/27/18 0533)     LOS: 5 days    Time spent: [place], MD [facility]  If 7PM-7AM, please contact night-coverage [email or web address] Password [email or web address] 07/27/2018, 10:36 AM

## 2018-07-27 NOTE — Progress Notes (Signed)
Kayla Guerrero   DOB:[birth year]   QQ#:[id number]    Assessment & Plan:  Metastatic uterine cancer CT showed stability/shrinkage of abdominal disease Even though the lung mass is bigger, with necrotic appearance, I felt that she might have positive response overall Continue supportive care I will cancel her chemotherapy next week to allow more time for recovery  Acute renal failure, improving Could be either due to third spacing, sepsis, worsening disease in her abdomen or recent exposure to contrastand hydronephrosis.She had recent stent placement. Continue supportive care and close monitoring  Recent PE I recommend holding off heparin in view of ongoing bleeding and platelet count less than 50,000 We will check blood count tomorrow. If platelet improved to greater than 50,000, okay to resume anticoagulation therapy.  I would defer to primary service for choices of anticoagulation therapy.  Acquired pancytopenia She has received blood transfusion last week. No need platelet transfusion unless platelet count is less than 10,000  Fever, resolved Blood culture was positive for E. coli, likely due to urinary tract infection She will continue antibiotic therapy  Moderate to severe protein calorie malnutrition, with bilateral lower extremity edema Due to third spacing and poor oral intake She needs dietitian consult Her appetite has improved.  I will discontinue dexamethasone.  She will continue Marinol for now  Progressive weakness Due to recurrent hospitalization She may benefit from PT consult; was evaluated last week.  Encourage ambulation I recommend skilled nursing facility placement upon discharge  Other constipation, resolved She will continue laxatives as needed  Discharge planning She is too weak to be discharged and not ready to be discharged due to severe pancytopenia.  I do not recommend discharge unless platelet count is greater than 50,000 The patient is  aware I will be away for medical leave. Dr. Lindi Adie will see her tomorrow  [name], MD 07/27/2018  6:35 AM   Subjective:  She is feeling better.  She was able to ambulate short distances without shortness of breath.  Overall improved.  She has been afebrile.  She has mild rectal bleeding yesterday, resolved spontaneously  Objective:  Vitals:   07/27/18 0445 07/27/18 0511  BP: (!) 163/83 (!) 166/83  Pulse: 96   Resp: 17   Temp: 98.4 F (36.9 C)   SpO2: 96%      Intake/Output Summary (Last 24 hours) at 07/27/2018 4332 Last data filed at 07/27/2018 0600 Gross per 24 hour  Intake 1378.17 ml  Output 400 ml  Net 978.17 ml    GENERAL:alert, no distress and comfortable SKIN: Extensive bruises are noted HEART: Moderate lower extremity edema NEURO: alert & oriented x 3 with fluent speech, no focal motor/sensory deficits   Labs:  Lab Results  Component Value Date   WBC 3.7 (L) 07/27/2018   HGB 9.2 (L) 07/27/2018   HCT 28.7 (L) 07/27/2018   MCV 82.9 07/27/2018   PLT 26 (LL) 07/27/2018   NEUTROABS 5.0 07/24/2018    Lab Results  Component Value Date   NA 140 07/27/2018   K 3.6 07/27/2018   CL 106 07/27/2018   CO2 24 07/27/2018    Studies:  No results found.

## 2018-07-27 NOTE — Plan of Care (Signed)
  Problem: Education: Goal: Knowledge of General Education information will improve Description: Including pain rating scale, medication(s)/side effects and non-pharmacologic comfort measures Outcome: Progressing   Problem: Health Behavior/Discharge Planning: Goal: Ability to manage health-related needs will improve Outcome: Progressing   Problem: Clinical Measurements: Goal: Ability to maintain clinical measurements within normal limits will improve Outcome: Progressing Goal: Will remain free from infection Outcome: Progressing Goal: Diagnostic test results will improve Outcome: Progressing Goal: Respiratory complications will improve Outcome: Progressing Goal: Cardiovascular complication will be avoided Outcome: Progressing   Problem: Activity: Goal: Risk for activity intolerance will decrease Outcome: Progressing   Problem: Nutrition: Goal: Adequate nutrition will be maintained Outcome: Progressing   Problem: Elimination: Goal: Will not experience complications related to bowel motility Outcome: Progressing Goal: Will not experience complications related to urinary retention Outcome: Progressing   Problem: Safety: Goal: Ability to remain free from injury will improve Outcome: Progressing   

## 2018-07-28 DIAGNOSIS — C787 Secondary malignant neoplasm of liver and intrahepatic bile duct: Secondary | ICD-10-CM

## 2018-07-28 DIAGNOSIS — D649 Anemia, unspecified: Secondary | ICD-10-CM

## 2018-07-28 DIAGNOSIS — A4151 Sepsis due to Escherichia coli [E. coli]: Principal | ICD-10-CM

## 2018-07-28 DIAGNOSIS — C541 Malignant neoplasm of endometrium: Secondary | ICD-10-CM

## 2018-07-28 DIAGNOSIS — D696 Thrombocytopenia, unspecified: Secondary | ICD-10-CM

## 2018-07-28 LAB — CBC
HCT: 26.3 % — ABNORMAL LOW (ref 36.0–46.0)
HEMOGLOBIN: 8 g/dL — AB (ref 12.0–15.0)
MCH: 25.9 pg — AB (ref 26.0–34.0)
MCHC: 30.4 g/dL (ref 30.0–36.0)
MCV: 85.1 fL (ref 80.0–100.0)
Platelets: 29 10*3/uL — CL (ref 150–400)
RBC: 3.09 MIL/uL — ABNORMAL LOW (ref 3.87–5.11)
RDW: 18.2 % — ABNORMAL HIGH (ref 11.5–15.5)
WBC: 3.4 10*3/uL — ABNORMAL LOW (ref 4.0–10.5)
nRBC: 0 % (ref 0.0–0.2)

## 2018-07-28 MED ORDER — HYDRALAZINE HCL 50 MG PO TABS
50.0000 mg | ORAL_TABLET | Freq: Three times a day (TID) | ORAL | Status: DC
  Administered 2018-07-28 – 2018-07-29 (×4): 50 mg via ORAL
  Filled 2018-07-28 (×4): qty 1

## 2018-07-28 NOTE — Progress Notes (Signed)
Physical Therapy Treatment Patient Details Name: Kayla Guerrero MRN: [id number] DOB: 01-22-1935 Today's Date: 07/28/2018    History of Present Illness 83 yo female admitted with AKI, pancytopenia, weakness. S/P L ureteral stent placement 10/24. Hx of L TKA, L foot drop, neuropathy, endometrial ca with mets, PE, DVT    PT Comments    Pt ambulated in hallway and performed LE exercises as tolerated.  Pt's mobility is improving, and she was encouraged to also ambulate with nursing throughout acute stay.    Follow Up Recommendations  Home health PT;Supervision/Assistance - 24 hour     Equipment Recommendations  None recommended by PT    Recommendations for Other Services       Precautions / Restrictions Precautions Precautions: Fall    Mobility  Bed Mobility               General bed mobility comments: oob in recliner  Transfers Overall transfer level: Needs assistance Equipment used: Rolling walker (2 wheeled) Transfers: Sit to/from Stand Sit to Stand: Min guard         General transfer comment: verbal cues for hand placement  Ambulation/Gait Ambulation/Gait assistance: Min guard Gait Distance (Feet): 280 Feet Assistive device: Rolling walker (2 wheeled) Gait Pattern/deviations: Step-through pattern;Trunk flexed     General Gait Details: verbal cues for RW positioning, able to improve distance this visit   Stairs             Wheelchair Mobility    Modified Rankin (Stroke Patients Only)       Balance                                            Cognition Arousal/Alertness: Awake/alert Behavior During Therapy: WFL for tasks assessed/performed Overall Cognitive Status: Within Functional Limits for tasks assessed                                        Exercises General Exercises - Lower Extremity Ankle Circles/Pumps: AROM;10 reps;Both Long Arc Quad: AROM;15 reps;Both;Seated Hip ABduction/ADduction:  AROM;15 reps;Both;Supine Hip Flexion/Marching: AROM;15 reps;Both;Seated    General Comments        Pertinent Vitals/Pain Pain Assessment: No/denies pain    Home Living                      Prior Function            PT Goals (current goals can now be found in the care plan section) Progress towards PT goals: Progressing toward goals    Frequency    Min 3X/week      PT Plan Current plan remains appropriate    Co-evaluation              AM-PAC PT "6 Clicks" Daily Activity  Outcome Measure  Difficulty turning over in bed (including adjusting bedclothes, sheets and blankets)?: A Lot Difficulty moving from lying on back to sitting on the side of the bed? : A Lot Difficulty sitting down on and standing up from a chair with arms (e.g., wheelchair, bedside commode, etc,.)?: A Lot Help needed moving to and from a bed to chair (including a wheelchair)?: A Little Help needed walking in hospital room?: A Little Help needed climbing 3-5 steps with a railing? : A Little 6  Click Score: 15    End of Session   Activity Tolerance: Patient tolerated treatment well Patient left: in chair;with call bell/phone within reach Nurse Communication: Mobility status PT Visit Diagnosis: Muscle weakness (generalized) (M62.81);Difficulty in walking, not elsewhere classified (R26.2)     Time: 1859-0931 PT Time Calculation (min) (ACUTE ONLY): 15 min  Charges:  $Gait Training: 8-22 mins                     Carmelia Bake, PT, DPT Acute Rehabilitation Services Office: [phone] Pager: [phone]  Trena Platt 07/28/2018, 1:13 PM

## 2018-07-28 NOTE — Progress Notes (Signed)
PROGRESS NOTE    Kayla Guerrero  PYP:[id number] DOB: March 01, 1935 DOA: 07/20/2018 PCP: Rita Ohara, MD   Brief Narrative:  83 year old with past medical history relevant for hypertension, hypothyroidism, widely metastatic endometrial carcinoma with disease to the lungs, abdomen with recent chemotherapy on 07/12/2018, stage III CKD, recent diagnosis of submassive PE and bilateral DVT on enoxaparin who was admitted on 07/20/2018 with profound weakness, intractable nausea, vomiting and found to have E. coli sepsis likely due to post renal obstruction from malignant source status post left ureteral stent placement on 07/22/2018.   Assessment & Plan:   Active Problems:   Chemotherapy induced nausea and vomiting   Anemia   Hyponatremia   Hypothyroidism   PE (pulmonary thromboembolism) (HCC)   Leg DVT (deep venous thromboembolism), acute, bilateral (HCC)   AKI (acute kidney injury) ([facility])   #) E. coli bacteremia: Likely from left-sided hydronephrosis. - blood cultures on 07/21/2018 grew out E. coli -Continue IV ampicillin started 07/26/2018  #) AKI on stage III CKD: Resolved status post left ureteral stent placed on 07/22/2018.  Patient has known chronic severe right-sided hydronephrosis -Avoid nephrotoxins -Urology following, appreciate recommendations  #) Anemia/thrombocytopenia: Platelet count his reach nadir and is now slowly going up.  Per hematology/oncology note they would prefer to discharge patient once her platelet count is above 50,000 -Supportive care, will restart heparin drip if platelets increase over 50 -DIC panel shows no evidence of DIC at this time, of note patient received platelet transfusion without any improvement in platelets on 07/24/2018  #) Widely metastatic endometrial carcinoma: Patient is status post 2 cycles of chemotherapy.  -Continue hydromorphone for pain control -Continue dexamethasone for nausea  -oncology following appreciate recommendations, Dr. Simeon Craft  such  #) DVT/submassive PE: Patient was recently diagnosed with this.  Likely provoked in the setting of malignancy. -Hold enoxaparin 1 mg/kg -Hold heparin infusion in the setting of thrombocytopenia  #) Hypothyroidism: -Continue levothyroxine 88 mcg  #) Pain/psych: -Continue duloxetine 30 mg nightly  #) Hypertension: Patient continues to have episodes of significant hypertension that is asymptomatic -Continue amlodipine 10 mg -Increase hydralazine to 50 mg 3 times daily  Fluids: Tolerating p.o. Electrodes: Monitor and supplement Nutrition: Regular diet  Prophylaxis: Heparin drip and thrombocytopenia  Disposition: Improving platelets  Full code    Consultants:   Oncology, Dr. Lottie Rater  Urology  Procedures:  07/22/2018 pending placement of ureteral stents  Antimicrobials:   IV cefepime and vancomycin 07/21/2018 to 07/22/2018  IV ceftriaxone 2 g started 07/22/2018 to 07/25/2018  IV ampicillin started 07/18/2018   Subjective: Patient reports that she is doing well today.  She feels frustrated that her platelet count does not improve more she is quite eager to go home.  She denies any nausea, vomiting, diarrhea, cough, congestion, rhinorrhea.  Objective: Vitals:   07/28/18 0513 07/28/18 0802 07/28/18 0808 07/28/18 0934  BP: (!) 164/82 (!) 152/68 (!) 146/68 (!) 163/78  Pulse: 100 100    Resp: 18 18    Temp: 98.7 F (37.1 C) 98.7 F (37.1 C)    TempSrc: Oral Oral    SpO2: 95% 97%    Weight:      Height:        Intake/Output Summary (Last 24 hours) at 07/28/2018 1017 Last data filed at 07/28/2018 1000 Gross per 24 hour  Intake 1330 ml  Output 0 ml  Net 1330 ml   Filed Weights   07/22/18 1456  Weight: 81.6 kg    Examination:  General exam: Appears calm  and comfortable  Respiratory system: Clear to auscultation. Respiratory effort normal.  Diminished lung sounds at bases Cardiovascular system: Regular rate and rhythm, no murmurs Gastrointestinal  system: Soft, no tenderness to deep palpation, no rebound or guarding, plus bowel sounds Central nervous system: Alert and oriented. No focal neurological deficits. Extremities: 1+ lower extremity edema Skin: Port site is clean dry and intact Psychiatry: Judgement and insight appear normal. Mood & affect appropriate.     Data Reviewed: I have personally reviewed following labs and imaging studies  CBC: Recent Labs  Lab 07/24/18 0425  07/24/18 1310 07/25/18 0328 07/26/18 0314 07/27/18 0450 07/28/18 0321  WBC 4.6  --  5.5 4.6 3.5* 3.7* 3.4*  NEUTROABS 3.7  --  5.0  --   --   --   --   HGB 9.6*  --  9.6* 9.6* 9.3* 9.2* 8.0*  HCT 30.4*  --  30.2* 30.3* 29.0* 28.7* 26.3*  MCV 83.1  --  82.7 82.3 82.2 82.9 85.1  PLT 23*   < > 41* 24* 18* 26* 29*   < > = values in this interval not displayed.   Basic Metabolic Panel: Recent Labs  Lab 07/22/18 0054 07/23/18 0346 07/24/18 0425 07/25/18 0328 07/26/18 0314 07/27/18 0450  NA 131* 139  139 141 140 142 140  K 3.6 3.3*  3.2* 3.7 3.4* 3.7 3.6  CL 101 109  109 109 107 107 106  CO2 20* 21*  22 23 25 26 24   GLUCOSE 130* 96  98 108* 98 105* 85  BUN 46* 41*  42* 32* 27* 24* 24*  CREATININE 2.42* 2.15*  2.08* 1.81* 1.63* 1.46* 1.45*  CALCIUM 7.2* 7.6*  7.6* 7.8* 7.8* 7.8* 8.2*  MG 1.7  --   --   --   --  1.6*  PHOS  --  3.6  --   --   --   --    GFR: Estimated Creatinine Clearance: 32.8 mL/min (A) (by C-G formula based on SCr of 1.45 mg/dL (H)). Liver Function Tests: Recent Labs  Lab 07/22/18 0054 07/23/18 0346 07/25/18 0328 07/27/18 0450  AST 19  --  12* 14*  ALT 12  --  10 11  ALKPHOS 62  --  55 51  BILITOT 0.4  --  0.4 0.6  PROT 5.4*  --  5.4* 5.6*  ALBUMIN 2.3* 2.2* 2.3* 2.8*   No results for input(s): LIPASE, AMYLASE in the last 168 hours. No results for input(s): AMMONIA in the last 168 hours. Coagulation Profile: Recent Labs  Lab 07/24/18 0810  INR 1.01   Cardiac Enzymes: No results for input(s):  CKTOTAL, CKMB, CKMBINDEX, TROPONINI in the last 168 hours. BNP (last 3 results) No results for input(s): PROBNP in the last 8760 hours. HbA1C: No results for input(s): HGBA1C in the last 72 hours. CBG: No results for input(s): GLUCAP in the last 168 hours. Lipid Profile: No results for input(s): CHOL, HDL, LDLCALC, TRIG, CHOLHDL, LDLDIRECT in the last 72 hours. Thyroid Function Tests: No results for input(s): TSH, T4TOTAL, FREET4, T3FREE, THYROIDAB in the last 72 hours. Anemia Panel: No results for input(s): VITAMINB12, FOLATE, FERRITIN, TIBC, IRON, RETICCTPCT in the last 72 hours. Sepsis Labs: Recent Labs  Lab 07/22/18 0054 07/23/18 0346  PROCALCITON 13.47 6.03    Recent Results (from the past 240 hour(s))  Culture, blood (routine x 2)     Status: Abnormal   Collection Time: 07/21/18  8:13 AM  Result Value Ref Range Status  Specimen Description   Final    BLOOD RIGHT ARM Performed at [facility] [address] [place], [place] [address]    Special Requests   Final    BOTTLES DRAWN AEROBIC AND ANAEROBIC Blood Culture adequate volume Performed at [facility] [address], [place], [place] [address]    Culture  Setup Time   Final    GRAM NEGATIVE RODS AEROBIC BOTTLE ONLY CRITICAL RESULT CALLED TO, READ BACK BY AND VERIFIED WITH: Lavell Luster [facility] 07/22/18 0026 JDW Performed at [facility], [place] [address], [place], [place] [address]    Culture ESCHERICHIA COLI (A)  Final   Report Status 07/23/2018 FINAL  Final   Organism ID, Bacteria ESCHERICHIA COLI  Final      Susceptibility   Escherichia coli - MIC*    AMPICILLIN <=2 SENSITIVE Sensitive     CEFAZOLIN <=4 SENSITIVE Sensitive     CEFEPIME <=1 SENSITIVE Sensitive     CEFTAZIDIME <=1 SENSITIVE Sensitive     CEFTRIAXONE <=1 SENSITIVE Sensitive     CIPROFLOXACIN <=0.25 SENSITIVE Sensitive     GENTAMICIN <=1 SENSITIVE Sensitive     IMIPENEM <=0.25 SENSITIVE Sensitive      TRIMETH/SULFA <=20 SENSITIVE Sensitive     AMPICILLIN/SULBACTAM <=2 SENSITIVE Sensitive     PIP/TAZO <=4 SENSITIVE Sensitive     Extended ESBL NEGATIVE Sensitive     * ESCHERICHIA COLI  Blood Culture ID Panel (Reflexed)     Status: Abnormal   Collection Time: 07/21/18  8:13 AM  Result Value Ref Range Status   Enterococcus species NOT DETECTED NOT DETECTED Final   Listeria monocytogenes NOT DETECTED NOT DETECTED Final   Staphylococcus species NOT DETECTED NOT DETECTED Final   Staphylococcus aureus (BCID) NOT DETECTED NOT DETECTED Final   Streptococcus species NOT DETECTED NOT DETECTED Final   Streptococcus agalactiae NOT DETECTED NOT DETECTED Final   Streptococcus pneumoniae NOT DETECTED NOT DETECTED Final   Streptococcus pyogenes NOT DETECTED NOT DETECTED Final   Acinetobacter baumannii NOT DETECTED NOT DETECTED Final   Enterobacteriaceae species DETECTED (A) NOT DETECTED Final    Comment: Enterobacteriaceae represent a large family of gram-negative bacteria, not a single organism. CRITICAL RESULT CALLED TO, READ BACK BY AND VERIFIED WITH: J GRIMSLEY PHARMD 07/22/18 0026 JDW    Enterobacter cloacae complex NOT DETECTED NOT DETECTED Final   Escherichia coli DETECTED (A) NOT DETECTED Final    Comment: CRITICAL RESULT CALLED TO, READ BACK BY AND VERIFIED WITH: J GRIMSLEY PHARMD  07/22/18 0026 JDW    Klebsiella oxytoca NOT DETECTED NOT DETECTED Final   Klebsiella pneumoniae NOT DETECTED NOT DETECTED Final   Proteus species NOT DETECTED NOT DETECTED Final   Serratia marcescens NOT DETECTED NOT DETECTED Final   Carbapenem resistance NOT DETECTED NOT DETECTED Final   Haemophilus influenzae NOT DETECTED NOT DETECTED Final   Neisseria meningitidis NOT DETECTED NOT DETECTED Final   Pseudomonas aeruginosa NOT DETECTED NOT DETECTED Final   Candida albicans NOT DETECTED NOT DETECTED Final   Candida glabrata NOT DETECTED NOT DETECTED Final   Candida krusei NOT DETECTED NOT DETECTED Final    Candida parapsilosis NOT DETECTED NOT DETECTED Final   Candida tropicalis NOT DETECTED NOT DETECTED Final    Comment: Performed at [facility], [place] [address] [place], [place] [address]  Culture, blood (routine x 2)     Status: Abnormal   Collection Time: 07/21/18  8:20 AM  Result Value Ref Range Status  Specimen Description   Final    BLOOD RIGHT WRIST Performed at [facility] [address] [place], [place] [address]    Special Requests   Final    BOTTLES DRAWN AEROBIC AND ANAEROBIC Blood Culture adequate volume Performed at [facility] [address], [place], [place] [address]    Culture  Setup Time   Final    GRAM NEGATIVE RODS IN BOTH AEROBIC AND ANAEROBIC BOTTLES CRITICAL VALUE NOTED.  VALUE IS CONSISTENT WITH PREVIOUSLY REPORTED AND CALLED VALUE.    Culture (A)  Final    ESCHERICHIA COLI SUSCEPTIBILITIES PERFORMED ON PREVIOUS CULTURE WITHIN THE LAST 5 DAYS. Performed at [facility], [place] [address], [place], [place] [address]    Report Status 07/27/2018 FINAL  Final  Urine culture     Status: Abnormal   Collection Time: 07/21/18  8:51 AM  Result Value Ref Range Status   Specimen Description   Final    URINE, CLEAN CATCH Performed at [facility], [place] [address] [place], [place] [address]    Special Requests   Final    NONE Performed at [facility], [place] [address], [place], [place] [address]    Culture (A)  Final    >=100,000 COLONIES/mL ESCHERICHIA COLI WITHIN MIXED ORGANISMS Performed at [facility], [place] [address], [place], [place] [address]    Report Status 07/26/2018 FINAL  Final   Organism ID, Bacteria ESCHERICHIA COLI (A)  Final      Susceptibility   Escherichia coli - MIC*    AMPICILLIN 4 SENSITIVE Sensitive     CEFAZOLIN <=4 SENSITIVE Sensitive     CEFTRIAXONE <=1 SENSITIVE Sensitive     CIPROFLOXACIN <=0.25 SENSITIVE Sensitive     GENTAMICIN <=1  SENSITIVE Sensitive     IMIPENEM <=0.25 SENSITIVE Sensitive     NITROFURANTOIN <=16 SENSITIVE Sensitive     TRIMETH/SULFA <=20 SENSITIVE Sensitive     AMPICILLIN/SULBACTAM <=2 SENSITIVE Sensitive     PIP/TAZO <=4 SENSITIVE Sensitive     Extended ESBL NEGATIVE Sensitive     * >=100,000 COLONIES/mL ESCHERICHIA COLI         Radiology Studies: No results found.      Scheduled Meds: . amLODipine  10 mg Oral Daily  . dronabinol  2.5 mg Oral BID AC  . hydrALAZINE  50 mg Oral TID  . levothyroxine  88 mcg Oral Q0600  . Melatonin  5 mg Oral QHS  . metoCLOPramide  10 mg Oral TID AC  . polyethylene glycol  17 g Oral Daily  . sodium chloride flush  10-40 mL Intracatheter Q12H  . triamcinolone  1 spray Nasal Daily  . valACYclovir  1,000 mg Oral Daily   Continuous Infusions: . ampicillin (OMNIPEN) IV Stopped (07/28/18 0533)     LOS: 6 days    Time spent: Regino Ramirez, MD [facility]  If 7PM-7AM, please contact night-coverage [email or web address] Password [email or web address] 07/28/2018, 10:17 AM

## 2018-07-28 NOTE — Progress Notes (Signed)
HEMATOLOGY-ONCOLOGY PROGRESS NOTE  SUBJECTIVE: Metastatic endometrial cancer with lung nodules, mediastinal adenopathy, multiple liver lesions, was on treatment with carboplatin.  Hospitalized 07/14/2018 for syncope, large pulmonary emboli.  Anticoagulation with heparin was previously given but with severe thrombus cytopenia no anticoagulation has been given currently.  CT of the abdomen showed improvement in the abdominal metastatic disease, the lung mass may be bigger but with necrotic appearance it is suggestive of response. Currently in the hospital for E. coli sepsis likely post renal obstruction from malignancy status post placement of left ureteral stent on 07/22/2018.  Acute renal failure was improving but patient developed thrombocytopenia.  Platelet count was normal at admission and dropped down about 8 days ago from 1 91-82 and it is currently at 62.  It went to as low as 18.  Patient was given platelet transfusion on 07/24/2018. Patient is getting very anxious and worried about the lack of progress in the platelet count.  OBJECTIVE: REVIEW OF SYSTEMS:   Constitutional: Denies fevers, chills or abnormal weight loss Eyes: Denies blurriness of vision Ears, nose, mouth, throat, and face: Denies mucositis or sore throat Respiratory: Denies cough, dyspnea or wheezes Cardiovascular: Denies palpitation, chest discomfort Gastrointestinal:  Denies nausea, heartburn or change in bowel habits Skin: Bruises on the dorsum of the palms Lymphatics: Denies new lymphadenopathy or easy bruising Neurological:Denies numbness, tingling or new weaknesses Behavioral/Psych: Mood is stable, no new changes  Extremities: 1+ lower extremity edema All other systems were reviewed with the patient and are negative.  I have reviewed the past medical history, past surgical history, social history and family history with the patient and they are unchanged from previous note.   PHYSICAL EXAMINATION: ECOG  PERFORMANCE STATUS: 2 - Symptomatic, <50% confined to bed  Vitals:   07/28/18 0934 07/28/18 1424  BP: (!) 163/78 (!) 103/52  Pulse:  (!) 101  Resp:  18  Temp:  98.4 F (36.9 C)  SpO2:  94%   Filed Weights   07/22/18 1456  Weight: 180 lb (81.6 kg)    GENERAL:alert, no distress and comfortable SKIN: skin color, texture, turgor are normal, no rashes or significant lesions EYES: normal, Conjunctiva are pink and non-injected, sclera clear OROPHARYNX:no exudate, no erythema and lips, buccal mucosa, and tongue normal  NECK: supple, thyroid normal size, non-tender, without nodularity LYMPH:  no palpable lymphadenopathy in the cervical, axillary or inguinal LUNGS: clear to auscultation and percussion with normal breathing effort HEART: regular rate & rhythm and no murmurs and no lower extremity edema ABDOMEN: Soft but tender to deep palpation NEURO: alert & oriented x 3 with fluent speech, no focal motor/sensory deficits  LABORATORY DATA:  I have reviewed the data as listed CMP Latest Ref Rng & Units 07/27/2018 07/26/2018 07/25/2018  Glucose 70 - 99 mg/dL 85 105(H) 98  BUN 8 - 23 mg/dL 24(H) 24(H) 27(H)  Creatinine 0.44 - 1.00 mg/dL 1.45(H) 1.46(H) 1.63(H)  Sodium 135 - 145 mmol/L 140 142 140  Potassium 3.5 - 5.1 mmol/L 3.6 3.7 3.4(L)  Chloride 98 - 111 mmol/L 106 107 107  CO2 22 - 32 mmol/L '24 26 25  '$ Calcium 8.9 - 10.3 mg/dL 8.2(L) 7.8(L) 7.8(L)  Total Protein 6.5 - 8.1 g/dL 5.6(L) - 5.4(L)  Total Bilirubin 0.3 - 1.2 mg/dL 0.6 - 0.4  Alkaline Phos 38 - 126 U/L 51 - 55  AST 15 - 41 U/L 14(L) - 12(L)  ALT 0 - 44 U/L 11 - 10    Lab Results  Component Value Date  WBC 3.4 (L) 07/28/2018   HGB 8.0 (L) 07/28/2018   HCT 26.3 (L) 07/28/2018   MCV 85.1 07/28/2018   PLT 29 (LL) 07/28/2018   NEUTROABS 5.0 07/24/2018    ASSESSMENT AND PLAN: 1.  Severe thrombocytopenia: I would like to obtain HIT antibody panel I discussed with the patient the differential diagnosis of  thrombocytopenia is between sepsis related versus prior chemotherapy related versus HIT versus bone marrow involvement with metastatic cancer versus ITP If tomorrow her platelets do not significantly improve, we will consider giving her steroids. The other option would be to perform a bone marrow biopsy on Friday if necessary. IVIG is also another option to be given for the thrombocytopenia.  2. metastatic endometrial cancer: Prior carboplatin chemotherapy. Chemo is on hold until she recovers.  3.  Pulmonary embolism: Anticoagulation on hold for thrombocytopenia.  Her goal is to start anticoagulation as soon as possible.  4.  Renal failure: Stable 5.  Normocytic anemia: Multifactorial I will see the patient tomorrow and make a decision on steroids.

## 2018-07-29 LAB — BASIC METABOLIC PANEL WITH GFR
Anion gap: 8 (ref 5–15)
CO2: 26 mmol/L (ref 22–32)
Chloride: 106 mmol/L (ref 98–111)
GFR calc Af Amer: 29 mL/min — ABNORMAL LOW (ref >60)

## 2018-07-29 LAB — BASIC METABOLIC PANEL
BUN: 25 mg/dL — ABNORMAL HIGH (ref 8–23)
Calcium: 7.7 mg/dL — ABNORMAL LOW (ref 8.9–10.3)
Creatinine, Ser: 1.81 mg/dL — ABNORMAL HIGH (ref 0.44–1.00)
GFR calc non Af Amer: 25 mL/min — ABNORMAL LOW (ref >60)
Glucose, Bld: 97 mg/dL (ref 70–99)
Potassium: 3.5 mmol/L (ref 3.5–5.1)
Sodium: 140 mmol/L (ref 135–145)

## 2018-07-29 LAB — CBC
HEMATOCRIT: 23.4 % — AB (ref 36.0–46.0)
HEMOGLOBIN: 7.4 g/dL — AB (ref 12.0–15.0)
MCH: 26.7 pg (ref 26.0–34.0)
MCHC: 31.6 g/dL (ref 30.0–36.0)
MCV: 84.5 fL (ref 80.0–100.0)
NRBC: 0 % (ref 0.0–0.2)
Platelets: 49 10*3/uL — ABNORMAL LOW (ref 150–400)
RBC: 2.77 MIL/uL — ABNORMAL LOW (ref 3.87–5.11)
RDW: 18.3 % — AB (ref 11.5–15.5)
WBC: 2.8 10*3/uL — AB (ref 4.0–10.5)

## 2018-07-29 LAB — MAGNESIUM: Magnesium: 1.9 mg/dL (ref 1.7–2.4)

## 2018-07-29 MED ORDER — HEPARIN SOD (PORK) LOCK FLUSH 100 UNIT/ML IV SOLN
500.0000 [IU] | INTRAVENOUS | Status: AC | PRN
  Administered 2018-07-29: 500 [IU]

## 2018-07-29 MED ORDER — HYDRALAZINE HCL 50 MG PO TABS: 50.0000 mg | ORAL_TABLET | Freq: Three times a day (TID) | ORAL | 1 refills | Status: DC

## 2018-07-29 MED ORDER — RIVAROXABAN 20 MG PO TABS
20.0000 mg | ORAL_TABLET | Freq: Every day | ORAL | Status: DC
  Administered 2018-07-29: 20 mg via ORAL
  Filled 2018-07-29: qty 1

## 2018-07-29 MED ORDER — RIVAROXABAN 20 MG PO TABS: 20.0000 mg | ORAL_TABLET | Freq: Every day | ORAL | 1 refills | Status: AC

## 2018-07-29 MED ORDER — AMOXICILLIN 500 MG PO CAPS: 1000.0000 mg | ORAL_CAPSULE | Freq: Two times a day (BID) | ORAL | 0 refills | Status: AC

## 2018-07-29 MED ORDER — AMOXICILLIN 250 MG PO CAPS
1000.0000 mg | ORAL_CAPSULE | Freq: Two times a day (BID) | ORAL | Status: DC
  Administered 2018-07-29: 1000 mg via ORAL
  Filled 2018-07-29 (×3): qty 4

## 2018-07-29 NOTE — Discharge Summary (Signed)
Physician Discharge Summary  CALLISTA HOH IDP:[id number] DOB: [birth year] DOA: 07/20/2018  PCP: Rita Ohara, MD  Admit date: 07/20/2018 Discharge date: 07/29/2018  Admitted From: ALF Disposition:  ALF  Recommendations for Outpatient Follow-up:  1. Follow up with PCP in 1-2 weeks 2. Please obtain BMP/CBC in one week  3.  please check your blood pressure every day.   Home Health: No Equipment/Devices:No  Discharge Condition: stalbe CODE STATUS: FULL Diet recommendation: regular  Brief/Interim Summary:  #) E. coli bacteremia: Patient was admitted with E. coli bacteremia.  CT imaging of the abdomen showed evidence of new versus slight left-sided hydronephrosis which was new.  Patient has known chronic right-sided hydronephrosis.  On 07/22/2018 patient had a left ureteral stent placed.  Blood cultures on 07/21/2018 growing E. coli.  Patient was started on IV antibiotics and narrowed to amoxicillin and discharged home to complete a total of 14 days for bacteremia.  #) AKI on stage III CKD: Patient was noted to have AKI.  This was thought to be in the setting of sepsis due to E. coli bacteremia and post renal obstruction.  Her creatinine improved after placement of left ureteral stent.  She was given IV fluids.  She has known chronic severe right-sided hydronephrosis that is unchanged.  She will follow-up with outpatient with urology.  #) Anemia/thrombocytopenia: This was thought to be secondary to recent chemotherapy.  She required 1 unit of packed red blood cells.  Her platelets decreased to a nadir of 19.  She was given 1 unit of platelets.  Her platelets on discharge were 49.  A DIC panel was negative.  #) DVT/submassive PE: Patient had a prior admission for DVT and pulmonary embolism.  She was on home enoxaparin.  She was transitioned to heparin infusion until her platelet count dropped below 50.  Due to her prolonged thrombocytopenia, AKI, CKD stage III it was decided to send patient  home on 20 mg of rivaroxaban without the initial loading dose.  #) Widely metastatic endometrial carcinoma: Patient was maintained on hydromorphone and dexamethasone which were outpatient medications.  She will continue to chemotherapy as an outpatient with her primary oncologist Dr. Simeon Craft such  #) Hyperthyroid is him: Patient was continued on home levothyroxine.  #) Pain/psych: Patient was continued on home duloxetine.  #) Uncontrolled hypertension: Patient was initially admitted with uncontrolled hypertension.  She was restarted on her home amlodipine but continued to have elevated systolics that were a symptom medic.  Dralzine 50 mg 3 times daily was added to her regimen.  Her blood pressure is much better controlled on this.  She was discharged home on this medication.  Discharge Diagnoses:  Active Problems:   Chemotherapy induced nausea and vomiting   Anemia   Hyponatremia   Hypothyroidism   PE (pulmonary thromboembolism) (HCC)   Leg DVT (deep venous thromboembolism), acute, bilateral (HCC)   AKI (acute kidney injury) [facility])    Discharge Instructions  Discharge Instructions    Call MD for:  difficulty breathing, headache or visual disturbances   Complete by:  As directed    Call MD for:  extreme fatigue   Complete by:  As directed    Call MD for:  hives   Complete by:  As directed    Call MD for:  persistant dizziness or light-headedness   Complete by:  As directed    Call MD for:  persistant nausea and vomiting   Complete by:  As directed    Call MD for:  redness, tenderness, or signs of infection (pain, swelling, redness, odor or green/yellow discharge around incision site)   Complete by:  As directed    Call MD for:  severe uncontrolled pain   Complete by:  As directed    Call MD for:  temperature >100.4   Complete by:  As directed    Diet - low sodium heart healthy   Complete by:  As directed    Discharge instructions   Complete by:  As directed    Please take your  antibiotics as prescribed.  Please stop giving herself Lovenox shots and start taking the rivaroxaban.  Please continue to take your hydralazine for blood pressure.   Increase activity slowly   Complete by:  As directed      Allergies as of 07/29/2018      Reactions   Levofloxacin Other (See Comments)   Unknown   Promethazine Hcl Other (See Comments)   fainting   Sulfa Antibiotics Swelling   Sulfa Drugs Cross Reactors Swelling   Sulfur Swelling      Medication List    STOP taking these medications   enoxaparin 80 MG/0.8ML injection Commonly known as:  LOVENOX     TAKE these medications   amLODipine 10 MG tablet Commonly known as:  NORVASC Take 1 tablet (10 mg total) by mouth daily.   amoxicillin 500 MG capsule Commonly known as:  AMOXIL Take 2 capsules (1,000 mg total) by mouth every 12 (twelve) hours for 5 days.   dexamethasone 4 MG tablet Commonly known as:  DECADRON Take 1 tablet (4 mg total) by mouth daily. What changed:  additional instructions   dronabinol 2.5 MG capsule Commonly known as:  MARINOL Take 1 capsule (2.5 mg total) by mouth 2 (two) times daily before lunch and supper.   DULoxetine 30 MG capsule Commonly known as:  CYMBALTA Take 30 mg by mouth at bedtime.   hydrALAZINE 50 MG tablet Commonly known as:  APRESOLINE Take 1 tablet (50 mg total) by mouth 3 (three) times daily.   HYDROmorphone 2 MG tablet Commonly known as:  DILAUDID Take 1 tablet (2 mg total) by mouth every 4 (four) hours as needed for severe pain.   levothyroxine 88 MCG tablet Commonly known as:  SYNTHROID, LEVOTHROID TAKE ONE TABLET BY MOUTH ONCE DAILY BEFORE  BREAKFAST.   lidocaine-prilocaine cream Commonly known as:  EMLA Apply 1 application topically as needed. What changed:  reasons to take this   Melatonin 5 MG Caps Take 5 mg by mouth at bedtime.   metoCLOPramide 10 MG tablet Commonly known as:  REGLAN Take 1 tablet (10 mg total) by mouth 3 (three) times daily  before meals.   ondansetron 8 MG tablet Commonly known as:  ZOFRAN Take 1 tablet (8 mg total) by mouth every 8 (eight) hours as needed for nausea.   polyethylene glycol packet Commonly known as:  MIRALAX / GLYCOLAX Take 17 g by mouth daily.   rivaroxaban 20 MG Tabs tablet Commonly known as:  XARELTO Take 1 tablet (20 mg total) by mouth daily.   SYSTANE ULTRA 0.4-0.3 % Soln Generic drug:  Polyethyl Glycol-Propyl Glycol Place 1 drop into both eyes daily as needed (for dry eyes).   triamcinolone 55 MCG/ACT Aero nasal inhaler Commonly known as:  NASACORT Place 1 spray into the nose 2 (two) times daily.   vitamin B-12 1000 MCG tablet Commonly known as:  CYANOCOBALAMIN Take 1,000 mcg by mouth daily.      Follow-up Information  Franchot Gallo, MD Follow up.   Specialty:  Urology Why:  We will call you to set up follow-up appointment Call the office if you need a medication for urgency/frequency from the stent. Contact information: [address] [place] [address] [phone]          Allergies  Allergen Reactions  . Levofloxacin Other (See Comments)    Unknown  . Promethazine Hcl Other (See Comments)    fainting  . Sulfa Antibiotics Swelling  . Sulfa Drugs Cross Reactors Swelling  . Sulfur Swelling    Consultations:  Urology, Dr Alinda Money  Oncology   Procedures/Studies: Ct Abdomen Pelvis Wo Contrast  Result Date: 07/21/2018 CLINICAL DATA:  Endometrial carcinoma.  Lung mass. EXAM: CT ABDOMEN AND PELVIS WITHOUT CONTRAST TECHNIQUE: Multidetector CT imaging of the abdomen and pelvis was performed following the standard protocol without IV contrast. COMPARISON:  CTA 07/14/2018, CT 06/11/2018 FINDINGS: Lower chest: Large RIGHT lower lobe mass again demonstrated measuring 8.8 cm compared to 7.8 cm on recent CT. There is central gas within lesions suggesting necrotic tumor. Hepatobiliary: No focal hepatic lesion on noncontrast exam. Postcholecystectomy.  Pancreas: Pancreas is normal. No ductal dilatation. No pancreatic inflammation. Spleen: Normal spleen Adrenals/urinary tract: Adrenal glands normal. There is severe hydronephrosis and hydroureter on the RIGHT. Hydroureter extends to the distal ureter where there is a mass along the RIGHT lateral wall of the bladder measuring 3.9 by 2.5 cm (image 69/2) which compares to 5.4 by 3.3 cm for potential decrease in size. There is mild hydronephrosis hydroureter ofthe LEFT collecting system without obstructing lesion identified. The hydroureter extends to the mid ureter and is increased from prior. Bladder normal Stomach/Bowel: Hiatal hernia. Stomach, duodenum small-bowel normal. No evidence of bowel obstruction. Appendix is normal. Colon rectosigmoid colon are normal Vascular/Lymphatic: Abdominal aortic normal caliber. There is intimal calcification. No retroperitoneal adenopathy. No pelvic adenopathy Reproductive: Post hysterectomy. Other: There is thickening within the ventral mesentery superior to the transverse colon (lesser omentum) measuring 7.5 by 1.9. This finding is similar to comparison exam. Small peritoneal implants along thesigmoid colon (image 57/2) also unchanged. Musculoskeletal: No aggressive osseous lesion IMPRESSION: 1. RIGHT lower lobe necrotic mass slightly increased in size from comparison exam is concerning for necrotic neoplasm / metastasis. 2. Severe hydronephrosis and hydroureter of the RIGHT collecting system secondary to obstructing at RIGHT pelvic sidewall. No significant change hydronephrosis. 3. No significant change in RIGHT pelvic sidewall mass. 4. New mild hydronephrosis hydroureter on the LEFT. No clear obstructing lesion identified. 5. Stable peritoneal and omental metastasis. Electronically Signed   By: Suzy Bouchard M.D.   On: 07/21/2018 12:49   Dg Chest 2 View  Result Date: 07/20/2018 CLINICAL DATA:  History metastatic endometrial carcinoma. Weakness today. EXAM: CHEST - 2  VIEW COMPARISON:  CT chest and PA and lateral chest 07/14/2018. FINDINGS: Scattered pulmonary nodules and a large mass in the right lower lobe appear unchanged. No consolidative process, pneumothorax or effusion. Heart size is normal. Aortic atherosclerosis is noted. Port-A-Cath is in place. No acute or focal bony abnormality. IMPRESSION: No acute disease in a patient with endometrial carcinoma metastatic to the lungs. Atherosclerosis. Electronically Signed   By: Inge Rise M.D.   On: 07/20/2018 10:16   Dg Chest 2 View  Result Date: 07/14/2018 CLINICAL DATA:  Dizziness after chemotherapy. EXAM: CHEST - 2 VIEW COMPARISON:  Chest CT 06/11/2018 FINDINGS: Heart size is borderline enlarged. There is aortic atherosclerosis. Port catheter tip terminates in the distal SVC. Rounded posterior pleural based  mass in the right lower lobe measuring 7.8 cm in diameter, previously noted on prior CT is without significant change allowing for differences in technique. The smaller pulmonary nodules better visualized on CT are not well visualized radiographically. No aggressive osseous lesions. No pulmonary consolidation, effusion or edema. IMPRESSION: 1. Pleural-based right lower lobe mass measuring 7.8 cm in diameter is redemonstrated. The small pulmonary masses and nodules are not well visualized radiographically nor are the mildly enlarged mediastinal lymph nodes described on CT. 2. No acute pulmonary consolidation, effusion or pneumothorax. 3. Borderline cardiomegaly with aortic atherosclerosis. Electronically Signed   By: Ashley Royalty M.D.   On: 07/14/2018 18:55   Ct Head Wo Contrast  Result Date: 07/14/2018 CLINICAL DATA:  Dizziness after chemotherapy. EXAM: CT HEAD WITHOUT CONTRAST TECHNIQUE: Contiguous axial images were obtained from the base of the skull through the vertex without intravenous contrast. COMPARISON:  None. FINDINGS: Brain: Chronic age related involutional changes of the brain. Likely chronic  minimal small vessel ischemic disease of periventricular white matter. No definite intra-axial mass given limitations of a noncontrast study. No evidence of vasogenic edema or midline shift. No extra-axial fluid collections. No hydrocephalus. Midline fourth ventricle basal cisterns. Brainstem and cerebellum are nonacute in appearance. Vascular: No hyperdense vessel sign. Atherosclerosis of the carotid siphons bilaterally. Skull: Negative for fracture or focal lesions. Sinuses/Orbits: No acute finding. Other: None. IMPRESSION: Mild involutional changes of the brain with chronic appearing small vessel ischemic disease. No acute intracranial abnormality. Electronically Signed   By: Ashley Royalty M.D.   On: 07/14/2018 18:58   Ct Angio Chest Pe W And/or Wo Contrast  Result Date: 07/14/2018 CLINICAL DATA:  Syncope this morning with dyspnea for several weeks. EXAM: CT ANGIOGRAPHY CHEST WITH CONTRAST TECHNIQUE: Multidetector CT imaging of the chest was performed using the standard protocol during bolus administration of intravenous contrast. Multiplanar CT image reconstructions and MIPs were obtained to evaluate the vascular anatomy. CONTRAST:  66mL ISOVUE-370 IOPAMIDOL (ISOVUE-370) INJECTION 76% COMPARISON:  CXR 07/14/2018, chest CT 06/11/2018 FINDINGS: Cardiovascular: Conventional branch pattern of the great vessels with atherosclerotic origins. Nonaneurysmal thoracic aorta to 3.5 cm. Satisfactory opacification of the pulmonary arteries with bilateral pulmonary artery starting from the distal most aspect of the main right and left pulmonary arteries extending into the lobar and segmental branches bilaterally. RV/LV ratio approximately 1.33 consistent with right heart failure. Heart size is enlarged. There left main coronary arteriosclerosis. Heart size is normal without pericardial effusion or thickening. Mediastinum/Nodes: Mediastinal lymphadenopathy is redemonstrated with a 1.4 cm short axis lymph node in the right  upper paratracheal portion versus 1.5 cm previously. Additional right lower paratracheal/precarinal lymph node measuring 1.7 cm short axis unchanged in appearance, series 4/39 versus series 2/28. No hilar lymphadenopathy. Moderate hiatal hernia. No thyromegaly or mass. The esophagus is unremarkable. Patent midline trachea and mainstem bronchi. Lungs/Pleura: Interval increase in size of dominant mass, pleural based in the posterior right lower lobe measuring 7.8 x 5.8 cm presently versus to 7.3 cm previously. New areas of necrosis are identified centrally with mottled foci of gas seen within. Trace pleural effusions are present. Additional pulmonary nodules are identified as follows: 1. Left upper lobe pleural-based 11 mm nodule versus 10 mm previously, series 10/31 versus series 6/38. 2. Left upper lobe 4 mm centrally located pulmonary nodule, series 10/40 versus 6 mm previously series 6/48. 3. Left upper lobe cavitary nodule measuring 1.4 cm, series 10/52 versus 1.5 cm previously on series 6/63. 4. Left upper lobe 10 mm nodule abutting  the major fissure, series 10/58 unchanged. 5. Right upper lobe posterior 18 mm nodule versus 21 mm previously, series 10/41 versus series 6/46. 6. Right upper lobe subpleural lateral nodule measuring 4 mm unchanged in appearance, series 10/61. 7. Right upper lobe posterior 6 mm cavitary nodule unchanged in appearance, series 10/64. 8. Right lower lobe partially cavitary 4 mm nodule adjacent to the major fissure, unchanged, series 10/93. Upper Abdomen: Partially included markedly dilated appearing right renal collecting system with cortical thinning and mild-to-moderate dilatation of the left renal collecting system. Subtle hypodensity in the right hepatic lobe measuring 12 mm in diameter, series 4/75, similar in appearance bed nonspecific. Metastatic focus is not excluded. Cholecystectomy is seen. Musculoskeletal: No aggressive osseous lesions. Review of the MIP images confirms the  above findings. IMPRESSION: 1. Positive for acute bilateral PE with CT evidence of right heart strain (RV/LV Ratio = 1.33) consistent with at least submassive (intermediate risk) PE. The presence of right heart strain has been associated with an increased risk of morbidity and mortality. Please activate Code PE by paging [phone]. 2. Interval increase in size of pleural-based hypodense pulmonary mass in the right lower lobe now with central areas of necrosis demonstrated. 3. Additional bilateral pulmonary nodules as above described with equivocal overall change some which appear to have decreased in size and is slightly increased. 4. Redemonstration of mediastinal lymphadenopathy also without significant progression. These results were called by telephone at the time of interpretation on 07/14/2018 at 10:28 pm to Dr. Nanda Quinton , who verbally acknowledged these results. Aortic Atherosclerosis (ICD10-I70.0). Electronically Signed   By: Ashley Royalty M.D.   On: 07/14/2018 22:28   Dg C-arm 1-60 Min-no Report  Result Date: 07/22/2018 Fluoroscopy was utilized by the requesting physician.  No radiographic interpretation.   Vas [place] Lower Extremity Venous (dvt)  Result Date: 07/15/2018  Lower Venous Study Indications: Pulmonary embolism.  Performing Technologist: Maudry Mayhew MHA, RDMS, RVT, RDCS  Examination Guidelines: A complete evaluation includes B-mode imaging, spectral Doppler, color Doppler, and power Doppler as needed of all accessible portions of each vessel. Bilateral testing is considered an integral part of a complete examination. Limited examinations for reoccurring indications may be performed as noted.  Right Venous Findings: +---------+---------------+---------+-----------+----------+------------------+          CompressibilityPhasicitySpontaneityPropertiesSummary            +---------+---------------+---------+-----------+----------+------------------+ CFV      Full                                                             +---------+---------------+---------+-----------+----------+------------------+ SFJ      Full                                                            +---------+---------------+---------+-----------+----------+------------------+ FV Prox  Full                                                            +---------+---------------+---------+-----------+----------+------------------+  FV Mid   Full                                                            +---------+---------------+---------+-----------+----------+------------------+ FV DistalFull                                                            +---------+---------------+---------+-----------+----------+------------------+ PFV      Full                                                            +---------+---------------+---------+-----------+----------+------------------+ POP      None           Yes      Yes                  Acute, slightly                                                          mobile             +---------+---------------+---------+-----------+----------+------------------+ PTV      None                                                            +---------+---------------+---------+-----------+----------+------------------+ PERO     None                    No                                      +---------+---------------+---------+-----------+----------+------------------+ Soleal                           No                                      +---------+---------------+---------+-----------+----------+------------------+ Gastroc  None                                         Acute              +---------+---------------+---------+-----------+----------+------------------+  Left Venous Findings: +---------+---------------+---------+-----------+----------+-------+           CompressibilityPhasicitySpontaneityPropertiesSummary +---------+---------------+---------+-----------+----------+-------+ CFV      Full           Yes  Yes                          +---------+---------------+---------+-----------+----------+-------+ SFJ      Full                                                 +---------+---------------+---------+-----------+----------+-------+ FV Prox  None                    No                   Acute   +---------+---------------+---------+-----------+----------+-------+ FV Mid   None                                         Acute   +---------+---------------+---------+-----------+----------+-------+ FV DistalFull                                                 +---------+---------------+---------+-----------+----------+-------+ PFV      Full                                                 +---------+---------------+---------+-----------+----------+-------+ POP      Full           Yes      Yes                          +---------+---------------+---------+-----------+----------+-------+ PTV      Full                                                 +---------+---------------+---------+-----------+----------+-------+ PERO     Full                                                 +---------+---------------+---------+-----------+----------+-------+    Summary: Right: Findings consistent with acute deep vein thrombosis involving the right posterior tibial vein, right popliteal vein, and right peroneal vein. Left: Findings consistent with acute deep vein thrombosis involving the left femoral vein. No cystic structure found in the popliteal fossa.  *See table(s) above for measurements and observations. Electronically signed by Monica Martinez MD on 07/15/2018 at 6:10:01 PM.    Final    07/22/2018 Cystoscopy Left ureteral stent placement (6 x 24) - no string  Bilateral retrograde pyelography with  interpretation   Subjective:   Discharge Exam: Vitals:   07/29/18 0447 07/29/18 0922  BP: (!) 135/53 133/78  Pulse: 98 93  Resp: 16 18  Temp: 98.7 F (37.1 C) 98.6 F (37 C)  SpO2: 91% 97%   Vitals:   07/28/18 1636 07/28/18 2106 07/29/18 0447 07/29/18 0922  BP: 120/61 126/64 (!) 135/53 133/78  Pulse:  [name])  102 98 93  Resp:  18 16 18   Temp:  98.4 F (36.9 C) 98.7 F (37.1 C) 98.6 F (37 C)  TempSrc:  Oral Oral Oral  SpO2:  94% 91% 97%  Weight:      Height:        General exam: Appears calm and comfortable  Respiratory system: Clear to auscultation. Respiratory effort normal.  Diminished lung sounds at bases Cardiovascular system: Regular rate and rhythm, no murmurs Gastrointestinal system: Soft, no tenderness to deep palpation, no rebound or guarding, plus bowel sounds Central nervous system: Alert and oriented. No focal neurological deficits. Extremities: 1+ lower extremity edema Skin: Port site is clean dry and intact Psychiatry: Judgement and insight appear normal. Mood & affect appropriate.     The results of significant diagnostics from this hospitalization (including imaging, microbiology, ancillary and laboratory) are listed below for reference.     Microbiology: Recent Results (from the past 240 hour(s))  Culture, blood (routine x 2)     Status: Abnormal   Collection Time: 07/21/18  8:13 AM  Result Value Ref Range Status   Specimen Description   Final    BLOOD RIGHT ARM Performed at [facility] [address], [place], [place] [address]    Special Requests   Final    BOTTLES DRAWN AEROBIC AND ANAEROBIC Blood Culture adequate volume Performed at [facility] [address], [place], [place] [address]    Culture  Setup Time   Final    GRAM NEGATIVE RODS AEROBIC BOTTLE ONLY CRITICAL RESULT CALLED TO, READ BACK BY AND VERIFIED WITH: Lavell Luster [facility] 07/22/18 0026 JDW Performed at [facility], [place] [address], [place],  [address]    Culture ESCHERICHIA COLI (A)  Final   Report Status 07/23/2018 FINAL  Final   Organism ID, Bacteria ESCHERICHIA COLI  Final      Susceptibility   Escherichia coli - MIC*    AMPICILLIN <=2 SENSITIVE Sensitive     CEFAZOLIN <=4 SENSITIVE Sensitive     CEFEPIME <=1 SENSITIVE Sensitive     CEFTAZIDIME <=1 SENSITIVE Sensitive     CEFTRIAXONE <=1 SENSITIVE Sensitive     CIPROFLOXACIN <=0.25 SENSITIVE Sensitive     GENTAMICIN <=1 SENSITIVE Sensitive     IMIPENEM <=0.25 SENSITIVE Sensitive     TRIMETH/SULFA <=20 SENSITIVE Sensitive     AMPICILLIN/SULBACTAM <=2 SENSITIVE Sensitive     PIP/TAZO <=4 SENSITIVE Sensitive     Extended ESBL NEGATIVE Sensitive     * ESCHERICHIA COLI  Blood Culture ID Panel (Reflexed)     Status: Abnormal   Collection Time: 07/21/18  8:13 AM  Result Value Ref Range Status   Enterococcus species NOT DETECTED NOT DETECTED Final   Listeria monocytogenes NOT DETECTED NOT DETECTED Final   Staphylococcus species NOT DETECTED NOT DETECTED Final   Staphylococcus aureus (BCID) NOT DETECTED NOT DETECTED Final   Streptococcus species NOT DETECTED NOT DETECTED Final   Streptococcus agalactiae NOT DETECTED NOT DETECTED Final   Streptococcus pneumoniae NOT DETECTED NOT DETECTED Final   Streptococcus pyogenes NOT DETECTED NOT DETECTED Final   Acinetobacter baumannii NOT DETECTED NOT DETECTED Final   Enterobacteriaceae species DETECTED (A) NOT DETECTED Final    Comment: Enterobacteriaceae represent a large family of gram-negative bacteria, not a single organism. CRITICAL RESULT CALLED TO, READ BACK BY AND VERIFIED WITH: J GRIMSLEY PHARMD 07/22/18 0026 JDW    Enterobacter cloacae complex NOT DETECTED NOT DETECTED Final   Escherichia  coli DETECTED (A) NOT DETECTED Final    Comment: CRITICAL RESULT CALLED TO, READ BACK BY AND VERIFIED WITH: J GRIMSLEY PHARMD  07/22/18 0026 JDW    Klebsiella oxytoca NOT DETECTED NOT DETECTED Final    Klebsiella pneumoniae NOT DETECTED NOT DETECTED Final   Proteus species NOT DETECTED NOT DETECTED Final   Serratia marcescens NOT DETECTED NOT DETECTED Final   Carbapenem resistance NOT DETECTED NOT DETECTED Final   Haemophilus influenzae NOT DETECTED NOT DETECTED Final   Neisseria meningitidis NOT DETECTED NOT DETECTED Final   Pseudomonas aeruginosa NOT DETECTED NOT DETECTED Final   Candida albicans NOT DETECTED NOT DETECTED Final   Candida glabrata NOT DETECTED NOT DETECTED Final   Candida krusei NOT DETECTED NOT DETECTED Final   Candida parapsilosis NOT DETECTED NOT DETECTED Final   Candida tropicalis NOT DETECTED NOT DETECTED Final    Comment: Performed at [facility], [place] [address], [place], [place] [address]  Culture, blood (routine x 2)     Status: Abnormal   Collection Time: 07/21/18  8:20 AM  Result Value Ref Range Status   Specimen Description   Final    BLOOD RIGHT WRIST Performed at [facility] [address], [place], [place] [address]    Special Requests   Final    BOTTLES DRAWN AEROBIC AND ANAEROBIC Blood Culture adequate volume Performed at [facility] [address] [place], [place] [address]    Culture  Setup Time   Final    GRAM NEGATIVE RODS IN BOTH AEROBIC AND ANAEROBIC BOTTLES CRITICAL VALUE NOTED.  VALUE IS CONSISTENT WITH PREVIOUSLY REPORTED AND CALLED VALUE.    Culture (A)  Final    ESCHERICHIA COLI SUSCEPTIBILITIES PERFORMED ON PREVIOUS CULTURE WITHIN THE LAST 5 DAYS. Performed at [facility], [place] [address], [place], [place] [address]    Report Status 07/27/2018 FINAL  Final  Urine culture     Status: Abnormal   Collection Time: 07/21/18  8:51 AM  Result Value Ref Range Status   Specimen Description   Final    URINE, CLEAN CATCH Performed at [facility], [place] [address] [place], [place] [address]    Special Requests   Final    NONE Performed at [facility], [place] [address] [place], [place] [address]    Culture (A)  Final    >=100,000 COLONIES/mL ESCHERICHIA COLI WITHIN MIXED ORGANISMS Performed at [facility], [place] [address] [place], [place] [address]    Report Status 07/26/2018 FINAL  Final   Organism ID, Bacteria ESCHERICHIA COLI (A)  Final      Susceptibility   Escherichia coli - MIC*    AMPICILLIN 4 SENSITIVE Sensitive     CEFAZOLIN <=4 SENSITIVE Sensitive     CEFTRIAXONE <=1 SENSITIVE Sensitive     CIPROFLOXACIN <=0.25 SENSITIVE Sensitive     GENTAMICIN <=1 SENSITIVE Sensitive     IMIPENEM <=0.25 SENSITIVE Sensitive     NITROFURANTOIN <=16 SENSITIVE Sensitive     TRIMETH/SULFA <=20 SENSITIVE Sensitive     AMPICILLIN/SULBACTAM <=2 SENSITIVE Sensitive     PIP/TAZO <=4 SENSITIVE Sensitive     Extended ESBL NEGATIVE Sensitive     * >=100,000 COLONIES/mL ESCHERICHIA COLI     Labs: BNP (last 3 results) Recent Labs    07/20/18 0915  BNP 053.9*   Basic Metabolic Panel: Recent Labs  Lab 07/23/18 0346 07/24/18 0425 07/25/18 7673 07/26/18 4193 07/27/18 0450 07/29/18 0321  NA 139  139 141 140 142 140 140  K 3.3*  3.2* 3.7 3.4* 3.7 3.6 3.5  CL 109  109 109 107 107 106 106  CO2 21*  22 23 25 26 24 26   GLUCOSE 96  98 108* 98 105* 85 97  BUN 41*  42* 32* 27* 24* 24* 25*  CREATININE 2.15*  2.08* 1.81* 1.63* 1.46* 1.45* 1.81*  CALCIUM 7.6*  7.6* 7.8* 7.8* 7.8* 8.2* 7.7*  MG  --   --   --   --  1.6* 1.9  PHOS 3.6  --   --   --   --   --    Liver Function Tests: Recent Labs  Lab 07/23/18 0346 07/25/18 0328 07/27/18 0450  AST  --  12* 14*  ALT  --  10 11  ALKPHOS  --  55 51  BILITOT  --  0.4 0.6  PROT  --  5.4* 5.6*  ALBUMIN 2.2* 2.3* 2.8*   No results for input(s): LIPASE, AMYLASE in the last 168 hours. No results for input(s): AMMONIA in the last 168 hours. CBC: Recent Labs  Lab 07/24/18 0425  07/24/18 1310 07/25/18 0328 07/26/18 0314 07/27/18 0450 07/28/18 0321 07/29/18 0321  WBC  4.6  --  5.5 4.6 3.5* 3.7* 3.4* 2.8*  NEUTROABS 3.7  --  5.0  --   --   --   --   --   HGB 9.6*  --  9.6* 9.6* 9.3* 9.2* 8.0* 7.4*  HCT 30.4*  --  30.2* 30.3* 29.0* 28.7* 26.3* 23.4*  MCV 83.1  --  82.7 82.3 82.2 82.9 85.1 84.5  PLT 23*   < > 41* 24* 18* 26* 29* 49*   < > = values in this interval not displayed.   Cardiac Enzymes: No results for input(s): CKTOTAL, CKMB, CKMBINDEX, TROPONINI in the last 168 hours. BNP: Invalid input(s): POCBNP CBG: No results for input(s): GLUCAP in the last 168 hours. D-Dimer No results for input(s): DDIMER in the last 72 hours. Hgb A1c No results for input(s): HGBA1C in the last 72 hours. Lipid Profile No results for input(s): CHOL, HDL, LDLCALC, TRIG, CHOLHDL, LDLDIRECT in the last 72 hours. Thyroid function studies No results for input(s): TSH, T4TOTAL, T3FREE, THYROIDAB in the last 72 hours.  Invalid input(s): FREET3 Anemia work up No results for input(s): VITAMINB12, FOLATE, FERRITIN, TIBC, IRON, RETICCTPCT in the last 72 hours. Urinalysis    Component Value Date/Time   COLORURINE YELLOW 07/20/2018 1830   APPEARANCEUR CLOUDY (A) 07/20/2018 1830   LABSPEC 1.012 07/20/2018 1830   LABSPEC 1.030 06/10/2018 1157   PHURINE 6.0 07/20/2018 1830   GLUCOSEU NEGATIVE 07/20/2018 1830   HGBUR MODERATE (A) 07/20/2018 1830   BILIRUBINUR NEGATIVE 07/20/2018 1830   BILIRUBINUR negative 06/10/2018 1157   BILIRUBINUR neg 02/16/2017 1259   KETONESUR NEGATIVE 07/20/2018 1830   PROTEINUR 100 (A) 07/20/2018 1830   UROBILINOGEN 0.2 02/16/2017 1259   NITRITE NEGATIVE 07/20/2018 1830   LEUKOCYTESUR LARGE (A) 07/20/2018 1830   Sepsis Labs Invalid input(s): PROCALCITONIN,  WBC,  LACTICIDVEN Microbiology Recent Results (from the past 240 hour(s))  Culture, blood (routine x 2)     Status: Abnormal   Collection Time: 07/21/18  8:13 AM  Result Value Ref Range Status   Specimen Description   Final    BLOOD RIGHT ARM Performed at [facility], [place] [address] [place], [place] [address]    Special Requests   Final  BOTTLES DRAWN AEROBIC AND ANAEROBIC Blood Culture adequate volume Performed at [facility] [address], [place], [place] [address]    Culture  Setup Time   Final    GRAM NEGATIVE RODS AEROBIC BOTTLE ONLY CRITICAL RESULT CALLED TO, READ BACK BY AND VERIFIED WITH: Lavell Luster [facility] 07/22/18 0026 JDW Performed at [facility], [place] [address], [place], [place] [address]    Culture ESCHERICHIA COLI (A)  Final   Report Status 07/23/2018 FINAL  Final   Organism ID, Bacteria ESCHERICHIA COLI  Final      Susceptibility   Escherichia coli - MIC*    AMPICILLIN <=2 SENSITIVE Sensitive     CEFAZOLIN <=4 SENSITIVE Sensitive     CEFEPIME <=1 SENSITIVE Sensitive     CEFTAZIDIME <=1 SENSITIVE Sensitive     CEFTRIAXONE <=1 SENSITIVE Sensitive     CIPROFLOXACIN <=0.25 SENSITIVE Sensitive     GENTAMICIN <=1 SENSITIVE Sensitive     IMIPENEM <=0.25 SENSITIVE Sensitive     TRIMETH/SULFA <=20 SENSITIVE Sensitive     AMPICILLIN/SULBACTAM <=2 SENSITIVE Sensitive     PIP/TAZO <=4 SENSITIVE Sensitive     Extended ESBL NEGATIVE Sensitive     * ESCHERICHIA COLI  Blood Culture ID Panel (Reflexed)     Status: Abnormal   Collection Time: 07/21/18  8:13 AM  Result Value Ref Range Status   Enterococcus species NOT DETECTED NOT DETECTED Final   Listeria monocytogenes NOT DETECTED NOT DETECTED Final   Staphylococcus species NOT DETECTED NOT DETECTED Final   Staphylococcus aureus (BCID) NOT DETECTED NOT DETECTED Final   Streptococcus species NOT DETECTED NOT DETECTED Final   Streptococcus agalactiae NOT DETECTED NOT DETECTED Final   Streptococcus pneumoniae NOT DETECTED NOT DETECTED Final   Streptococcus pyogenes NOT DETECTED NOT DETECTED Final   Acinetobacter baumannii NOT DETECTED NOT DETECTED Final   Enterobacteriaceae species DETECTED (A) NOT DETECTED Final    Comment: Enterobacteriaceae  represent a large family of gram-negative bacteria, not a single organism. CRITICAL RESULT CALLED TO, READ BACK BY AND VERIFIED WITH: J GRIMSLEY PHARMD 07/22/18 0026 JDW    Enterobacter cloacae complex NOT DETECTED NOT DETECTED Final   Escherichia coli DETECTED (A) NOT DETECTED Final    Comment: CRITICAL RESULT CALLED TO, READ BACK BY AND VERIFIED WITH: J GRIMSLEY PHARMD  07/22/18 0026 JDW    Klebsiella oxytoca NOT DETECTED NOT DETECTED Final   Klebsiella pneumoniae NOT DETECTED NOT DETECTED Final   Proteus species NOT DETECTED NOT DETECTED Final   Serratia marcescens NOT DETECTED NOT DETECTED Final   Carbapenem resistance NOT DETECTED NOT DETECTED Final   Haemophilus influenzae NOT DETECTED NOT DETECTED Final   Neisseria meningitidis NOT DETECTED NOT DETECTED Final   Pseudomonas aeruginosa NOT DETECTED NOT DETECTED Final   Candida albicans NOT DETECTED NOT DETECTED Final   Candida glabrata NOT DETECTED NOT DETECTED Final   Candida krusei NOT DETECTED NOT DETECTED Final   Candida parapsilosis NOT DETECTED NOT DETECTED Final   Candida tropicalis NOT DETECTED NOT DETECTED Final    Comment: Performed at [facility], [place] [address], [place], [place] [address]  Culture, blood (routine x 2)     Status: Abnormal   Collection Time: 07/21/18  8:20 AM  Result Value Ref Range Status   Specimen Description   Final    BLOOD RIGHT WRIST Performed at [facility] [address], [place], [place] [address]    Special Requests   Final    BOTTLES  DRAWN AEROBIC AND ANAEROBIC Blood Culture adequate volume Performed at [facility] [address] [place], [place] [address]    Culture  Setup Time   Final    GRAM NEGATIVE RODS IN BOTH AEROBIC AND ANAEROBIC BOTTLES CRITICAL VALUE NOTED.  VALUE IS CONSISTENT WITH PREVIOUSLY REPORTED AND CALLED VALUE.    Culture (A)  Final    ESCHERICHIA COLI SUSCEPTIBILITIES PERFORMED ON PREVIOUS CULTURE WITHIN THE LAST  5 DAYS. Performed at [facility], [place] [address], [place], [place] [address]    Report Status 07/27/2018 FINAL  Final  Urine culture     Status: Abnormal   Collection Time: 07/21/18  8:51 AM  Result Value Ref Range Status   Specimen Description   Final    URINE, CLEAN CATCH Performed at [facility], [place] [address] [place], [place] [address]    Special Requests   Final    NONE Performed at [facility], [place] [address], [place], [place] [address]    Culture (A)  Final    >=100,000 COLONIES/mL ESCHERICHIA COLI WITHIN MIXED ORGANISMS Performed at [facility], [place] [address], [place],  [address]    Report Status 07/26/2018 FINAL  Final   Organism ID, Bacteria ESCHERICHIA COLI (A)  Final      Susceptibility   Escherichia coli - MIC*    AMPICILLIN 4 SENSITIVE Sensitive     CEFAZOLIN <=4 SENSITIVE Sensitive     CEFTRIAXONE <=1 SENSITIVE Sensitive     CIPROFLOXACIN <=0.25 SENSITIVE Sensitive     GENTAMICIN <=1 SENSITIVE Sensitive     IMIPENEM <=0.25 SENSITIVE Sensitive     NITROFURANTOIN <=16 SENSITIVE Sensitive     TRIMETH/SULFA <=20 SENSITIVE Sensitive     AMPICILLIN/SULBACTAM <=2 SENSITIVE Sensitive     PIP/TAZO <=4 SENSITIVE Sensitive     Extended ESBL NEGATIVE Sensitive     * >=100,000 COLONIES/mL ESCHERICHIA COLI     Time coordinating discharge: 35  SIGNED:   Cristy Folks, MD  [facility] 07/29/2018, 10:46 AM  If 7PM-7AM, please contact night-coverage [email or web address] Password [email or web address]

## 2018-07-29 NOTE — Care Management Important Message (Signed)
Important Message  Patient Details  Name: Kayla Guerrero MRN: [id number] Date of Birth: 08/10/1935   Medicare Important Message Given:  Yes    Kerin Salen 07/29/2018, 1:16 PMImportant Message  Patient Details  Name: Kayla Guerrero MRN: [id number] Date of Birth: [birth year]   Medicare Important Message Given:  Yes    Kerin Salen 07/29/2018, 1:16 PM

## 2018-07-29 NOTE — Progress Notes (Signed)
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 Patient does not need a bone marrow biopsy or steroid therapy given the improvement in the platelet counts.

## 2018-07-29 NOTE — Progress Notes (Signed)
Pt returning to [facility] admitting to SNF building - report #[phone]. Husband transporting her. As pt is resident at independent living community at [facility], able to admit with authorization with [facility] pending per admissions.  Sharren Bridge, MSW, LCSW Clinical Social Work 07/29/2018 [phone]

## 2018-07-29 NOTE — Progress Notes (Signed)
Patient verbalized understanding of discharge instructions. Patient is stable at discharge. Prescriptions and AVS was given at discharge. Patient's husband is at bedside.

## 2018-07-29 NOTE — Discharge Instructions (Signed)
Rivaroxaban oral tablets °What is this medicine? °RIVAROXABAN (ri va ROX a ban) is an anticoagulant (blood thinner). It is used to treat blood clots in the lungs or in the veins. It is also used after knee or hip surgeries to prevent blood clots. It is also used to lower the chance of stroke in people with a medical condition called atrial fibrillation. °This medicine may be used for other purposes; ask your health care provider or pharmacist if you have questions. °COMMON BRAND NAME(S): Xarelto, Xarelto Starter Pack °What should I tell my health care provider before I take this medicine? °They need to know if you have any of these conditions: °-bleeding disorders °-bleeding in the brain °-blood in your stools (black or tarry stools) or if you have blood in your vomit °-history of stomach bleeding °-kidney disease °-liver disease °-low blood counts, like low white cell, platelet, or red cell counts °-recent or planned spinal or epidural procedure °-take medicines that treat or prevent blood clots °-an unusual or allergic reaction to rivaroxaban, other medicines, foods, dyes, or preservatives °-pregnant or trying to get pregnant °-breast-feeding °How should I use this medicine? °Take this medicine by mouth with a glass of water. Follow the directions on the prescription label. Take your medicine at regular intervals. Do not take it more often than directed. Do not stop taking except on your doctor's advice. Stopping this medicine may increase your risk of a blood clot. Be sure to refill your prescription before you run out of medicine. °If you are taking this medicine after hip or knee replacement surgery, take it with or without food. If you are taking this medicine for atrial fibrillation, take it with your evening meal. If you are taking this medicine to treat blood clots, take it with food at the same time each day. If you are unable to swallow your tablet, you may crush the tablet and mix it in applesauce. Then,  immediately eat the applesauce. You should eat more food right after you eat the applesauce containing the crushed tablet. °Talk to your pediatrician regarding the use of this medicine in children. Special care may be needed. °Overdosage: If you think you have taken too much of this medicine contact a poison control center or emergency room at once. °NOTE: This medicine is only for you. Do not share this medicine with others. °What if I miss a dose? °If you take your medicine once a day and miss a dose, take the missed dose as soon as you remember. If you take your medicine twice a day and miss a dose, take the missed dose immediately. In this instance, 2 tablets may be taken at the same time. The next day you should take 1 tablet twice a day as directed. °What may interact with this medicine? °Do not take this medicine with any of the following medications: °-defibrotide °This medicine may also interact with the following medications: °-aspirin and aspirin-like medicines °-certain antibiotics like erythromycin, azithromycin, and clarithromycin °-certain medicines for fungal infections like ketoconazole and itraconazole °-certain medicines for irregular heart beat like amiodarone, quinidine, dronedarone °-certain medicines for seizures like carbamazepine, phenytoin °-certain medicines that treat or prevent blood clots like warfarin, enoxaparin, and dalteparin °-conivaptan °-diltiazem °-felodipine °-indinavir °-lopinavir; ritonavir °-NSAIDS, medicines for pain and inflammation, like ibuprofen or naproxen °-ranolazine °-rifampin °-ritonavir °-SNRIs, medicines for depression, like desvenlafaxine, duloxetine, levomilnacipran, venlafaxine °-SSRIs, medicines for depression, like citalopram, escitalopram, fluoxetine, fluvoxamine, paroxetine, sertraline °-St. John's wort °-verapamil °This list may not describe all   possible interactions. Give your health care provider a list of all the medicines, herbs, non-prescription  drugs, or dietary supplements you use. Also tell them if you smoke, drink alcohol, or use illegal drugs. Some items may interact with your medicine. °What should I watch for while using this medicine? °Visit your doctor or health care professional for regular checks on your progress. °Notify your doctor or health care professional and seek emergency treatment if you develop breathing problems; changes in vision; chest pain; severe, sudden headache; pain, swelling, warmth in the leg; trouble speaking; sudden numbness or weakness of the face, arm or leg. These can be signs that your condition has gotten worse. °If you are going to have surgery or other procedure, tell your doctor that you are taking this medicine. °What side effects may I notice from receiving this medicine? °Side effects that you should report to your doctor or health care professional as soon as possible: °-allergic reactions like skin rash, itching or hives, swelling of the face, lips, or tongue °-back pain °-redness, blistering, peeling or loosening of the skin, including inside the mouth °-signs and symptoms of bleeding such as bloody or black, tarry stools; red or dark-brown urine; spitting up blood or brown material that looks like coffee grounds; red spots on the skin; unusual bruising or bleeding from the eye, gums, or nose °Side effects that usually do not require medical attention (report to your doctor or health care professional if they continue or are bothersome): °-dizziness °-muscle pain °This list may not describe all possible side effects. Call your doctor for medical advice about side effects. You may report side effects to FDA at 1-800-FDA-1088. °Where should I keep my medicine? °Keep out of the reach of children. °Store at room temperature between 15 and 30 degrees C (59 and 86 degrees F). Throw away any unused medicine after the expiration date. °NOTE: This sheet is a summary. It may not cover all possible information. If you  have questions about this medicine, talk to your doctor, pharmacist, or health care provider. °© 2018 [employer] (2016-06-04 16:29:33) ° °

## 2018-07-29 NOTE — NC FL2 (Signed)
[place] LEVEL OF CARE SCREENING TOOL     IDENTIFICATION  Patient Name: Kayla Guerrero Birthdate: November 03, 1934 Sex: female Admission Date (Current Location): 07/20/2018  [facility] and [place] Number:  Herbalist and Address:  [facility],  [place] [address] [place]      Provider Number: [id number]  Attending Physician Name and Address:  Cristy Folks, MD  Relative Name and Phone Number:       Current Level of Care: Hospital Recommended Level of Care: [place] Prior Approval Number:    Date Approved/Denied:   PASRR Number: [id number]  Discharge Plan: SNF    Current Diagnoses: Patient Active Problem List   Diagnosis Date Noted  . AKI (acute kidney injury) ([place]) 07/20/2018  . Leg DVT (deep venous thromboembolism), acute, bilateral ([place]) 07/16/2018  . Hypertension 07/16/2018  . PE (pulmonary thromboembolism) ([place]) 07/14/2018  . B12 deficiency 07/12/2018  . Sinus congestion 07/02/2018  . Nausea without vomiting 07/02/2018  . Congestion of nasal sinus 07/02/2018  . Metastasis to lung ([place]) 06/14/2018  . Metastasis to liver ([place]) 06/14/2018  . Peritoneal metastases ([place]) 06/14/2018  . Cancer associated pain 06/14/2018  . Goals of care, counseling/discussion 06/14/2018  . Hydronephrosis of right kidney 06/14/2018  . Preventive measure 06/14/2018  . OA (osteoarthritis) of knee 04/12/2018  . Leukopenia 03/04/2018  . Sepsis ([place]) 02/17/2017  . Anemia 02/17/2017  . Hyponatremia 02/17/2017  . Hypothyroidism 02/17/2017  . Kidney lesion, native, right 01/08/2017  . Left foot drop 01/08/2017  . Leukopenia due to antineoplastic chemotherapy ([place]) 09/29/2016  . Pain in both lower extremities 09/29/2016  . Pelvic fluid collection 09/29/2016  . Port-A-Cath in place 08/27/2016  . Syncope and collapse 08/10/2016  . Sympathotonic orthostatic hypotension 08/02/2016  . Chemotherapy-induced peripheral neuropathy  ([place]) 07/12/2016  . Chemotherapy induced nausea and vomiting 07/12/2016  . Chemotherapy-induced neuropathy ([place]) 07/02/2016  . Chemotherapy induced neutropenia ([place]) 06/20/2016  . Poor venous access 06/20/2016  . Osteoarthritis of multiple joints 06/06/2016  . Neuropathy 06/06/2016  . Acute postoperative pain of left groin 05/16/2016  . Malignant neoplasm of endometrium ([place]) 04/23/2016  . GERD (gastroesophageal reflux disease) 04/06/2014  . Arthritis of knee 10/06/2013  . Macular degeneration 12/19/2012  . Left knee pain 09/15/2012  . Osteopenia 09/15/2012  . Pure hypercholesterolemia 06/11/2011  . Thyroid activity decreased 06/11/2011    Orientation RESPIRATION BLADDER Height & Weight     Self, Situation, Time, Place  Normal Continent Weight: 180 lb (81.6 kg) Height:  5\' 6"  (167.6 cm)  BEHAVIORAL SYMPTOMS/MOOD NEUROLOGICAL BOWEL NUTRITION STATUS      Continent Diet(low sodium heart healthy)  AMBULATORY STATUS COMMUNICATION OF NEEDS Skin   Limited Assist Verbally Normal                       Personal Care Assistance Level of Assistance  Bathing, Feeding, Dressing Bathing Assistance: Limited assistance Feeding assistance: Independent Dressing Assistance: Limited assistance     Functional Limitations Info  Sight, Hearing, Speech Sight Info: Adequate Hearing Info: Adequate Speech Info: Adequate    SPECIAL CARE FACTORS FREQUENCY  PT (By licensed PT), OT (By licensed OT)     PT Frequency: 5x OT Frequency: 5x            Contractures Contractures Info: Not present    Additional Factors Info  Code Status, Allergies Code Status Info: full code Allergies Info: Levofloxacin, Promethazine Hcl, Sulfa Antibiotics, Sulfa Drugs [employer],  Sulfur           Current Medications (07/29/2018):  This is the current hospital active medication list Current Facility-Administered Medications  Medication Dose Route Frequency Provider Last Rate Last Dose  .  acetaminophen (TYLENOL) tablet 650 mg  650 mg Oral Q6H PRN Raynelle Bring, MD   650 mg at 07/20/18 2115   Or  . acetaminophen (TYLENOL) suppository 650 mg  650 mg Rectal Q6H PRN Raynelle Bring, MD      . alum & mag hydroxide-simeth (MAALOX/MYLANTA) 200-200-20 MG/5ML suspension 30 mL  30 mL Oral Q4H PRN Purohit, Konrad Dolores, MD   30 mL at 07/24/18 0115  . amLODipine (NORVASC) tablet 10 mg  10 mg Oral Daily Purohit, Shrey C, MD   10 mg at 07/29/18 1014  . amoxicillin (AMOXIL) capsule 1,000 mg  1,000 mg Oral Q12H Purohit, Shrey C, MD      . dronabinol (MARINOL) capsule 2.5 mg  2.5 mg Oral BID Kandis Mannan, MD   2.5 mg at 07/28/18 1636  . hydrALAZINE (APRESOLINE) tablet 50 mg  50 mg Oral TID Thomes Dinning C, MD   50 mg at 07/29/18 1012  . HYDROmorphone (DILAUDID) tablet 2 mg  2 mg Oral Q4H PRN Raynelle Bring, MD   2 mg at 07/23/18 0354  . levothyroxine (SYNTHROID, LEVOTHROID) tablet 88 mcg  88 mcg Oral Q0600 Raynelle Bring, MD   88 mcg at 07/29/18 0521  . lidocaine-prilocaine (EMLA) cream   Topical Daily PRN Raynelle Bring, MD      . Melatonin TABS 5 mg  5 mg Oral QHS Raynelle Bring, MD   5 mg at 07/28/18 2123  . metoCLOPramide (REGLAN) tablet 10 mg  10 mg Oral TID Kandis Mannan, MD   10 mg at 07/29/18 0842  . ondansetron (ZOFRAN) tablet 8 mg  8 mg Oral Q6H PRN Raynelle Bring, MD   8 mg at 07/23/18 2213   Or  . ondansetron [facility]) injection 4 mg  4 mg Intravenous Q6H PRN Raynelle Bring, MD   4 mg at 07/23/18 0355  . polyethylene glycol (MIRALAX / GLYCOLAX) packet 17 g  17 g Oral Daily Alvy Bimler, Ni, MD   17 g at 07/29/18 1014  . polyvinyl alcohol (LIQUIFILM TEARS) 1.4 % ophthalmic solution 1 drop  1 drop Both Eyes Daily PRN Raynelle Bring, MD   1 drop at 07/25/18 1000  . rivaroxaban (XARELTO) tablet 20 mg  20 mg Oral Q supper Purohit, Shrey C, MD      . sodium chloride flush (NS) 0.9 % injection 10-40 mL  10-40 mL Intracatheter Q12H Raynelle Bring, MD   10 mL at 07/29/18 1017  . sodium chloride  flush (NS) 0.9 % injection 10-40 mL  10-40 mL Intracatheter PRN Raynelle Bring, MD   20 mL at 07/29/18 0324  . triamcinolone (NASACORT) nasal inhaler 1 spray  1 spray Nasal Daily Raynelle Bring, MD   1 spray at 07/29/18 1017  . valACYclovir (VALTREX) tablet 1,000 mg  1,000 mg Oral Daily Purohit, Shrey C, MD   1,000 mg at 07/29/18 1013   Facility-Administered Medications Ordered in Other Encounters  Medication Dose Route Frequency Provider Last Rate Last Dose  . influenza  inactive virus vaccine (FLUZONE/FLUARIX) injection 0.5 mL  0.5 mL Intramuscular Once Rita Ohara, MD         Discharge Medications: TAKE these medications   amLODipine 10 MG tablet Commonly known as:  NORVASC Take 1 tablet (10 mg total) by mouth  daily.   amoxicillin 500 MG capsule Commonly known as:  AMOXIL Take 2 capsules (1,000 mg total) by mouth every 12 (twelve) hours for 5 days.   dexamethasone 4 MG tablet Commonly known as:  DECADRON Take 1 tablet (4 mg total) by mouth daily. What changed:  additional instructions   dronabinol 2.5 MG capsule Commonly known as:  MARINOL Take 1 capsule (2.5 mg total) by mouth 2 (two) times daily before lunch and supper.   DULoxetine 30 MG capsule Commonly known as:  CYMBALTA Take 30 mg by mouth at bedtime.   hydrALAZINE 50 MG tablet Commonly known as:  APRESOLINE Take 1 tablet (50 mg total) by mouth 3 (three) times daily.   HYDROmorphone 2 MG tablet Commonly known as:  DILAUDID Take 1 tablet (2 mg total) by mouth every 4 (four) hours as needed for severe pain.   levothyroxine 88 MCG tablet Commonly known as:  SYNTHROID, LEVOTHROID TAKE ONE TABLET BY MOUTH ONCE DAILY BEFORE  BREAKFAST.   lidocaine-prilocaine cream Commonly known as:  EMLA Apply 1 application topically as needed. What changed:  reasons to take this   Melatonin 5 MG Caps Take 5 mg by mouth at bedtime.   metoCLOPramide 10 MG tablet Commonly known as:  REGLAN Take 1 tablet (10 mg total)  by mouth 3 (three) times daily before meals.   ondansetron 8 MG tablet Commonly known as:  ZOFRAN Take 1 tablet (8 mg total) by mouth every 8 (eight) hours as needed for nausea.   polyethylene glycol packet Commonly known as:  MIRALAX / GLYCOLAX Take 17 g by mouth daily.   rivaroxaban 20 MG Tabs tablet Commonly known as:  XARELTO Take 1 tablet (20 mg total) by mouth daily.   SYSTANE ULTRA 0.4-0.3 % Soln Generic drug:  Polyethyl Glycol-Propyl Glycol Place 1 drop into both eyes daily as needed (for dry eyes).   triamcinolone 55 MCG/ACT Aero nasal inhaler Commonly known as:  NASACORT Place 1 spray into the nose 2 (two) times daily.   vitamin B-12 1000 MCG tablet Commonly known as:  CYANOCOBALAMIN Take 1,000 mcg by mouth daily.     Relevant Imaging Results:  Relevant Lab Results:   Additional Information SS# [id number]  Nila Nephew, LCSW

## 2018-07-29 NOTE — Progress Notes (Signed)
Pt states that she will continue PT at [facility].

## 2018-07-29 NOTE — Progress Notes (Signed)
HEMATOLOGY-ONCOLOGY PROGRESS NOTE  SUBJECTIVE: Patient is sitting up in chair and feeling a lot better. She is expected to be discharged to skilled nursing facility today.  OBJECTIVE: REVIEW OF SYSTEMS:   Constitutional: Denies fevers, chills or abnormal weight loss Eyes: Denies blurriness of vision Ears, nose, mouth, throat, and face: Denies mucositis or sore throat Respiratory: Denies cough, dyspnea or wheezes Cardiovascular: Denies palpitation, chest discomfort Gastrointestinal:  Denies nausea, heartburn or change in bowel habits Skin: Denies abnormal skin rashes Lymphatics: Denies new lymphadenopathy or easy bruising Neurological:Denies numbness, tingling or new weaknesses Behavioral/Psych: Mood is stable, no new changes  Extremities: Slight leg swelling   All other systems were reviewed with the patient and are negative.  I have reviewed the past medical history, past surgical history, social history and family history with the patient and they are unchanged from previous note.   PHYSICAL EXAMINATION: ECOG PERFORMANCE STATUS: 2 - Symptomatic, <50% confined to bed  Vitals:   07/29/18 0447 07/29/18 0922  BP: (!) 135/53 133/78  Pulse: 98 93  Resp: 16 18  Temp: 98.7 F (37.1 C) 98.6 F (37 C)  SpO2: 91% 97%   Filed Weights   07/22/18 1456  Weight: 180 lb (81.6 kg)    GENERAL:alert, no distress and comfortable SKIN: skin color, texture, turgor are normal, no rashes or significant lesions EYES: normal, Conjunctiva are pink and non-injected, sclera clear OROPHARYNX:no exudate, no erythema and lips, buccal mucosa, and tongue normal  NECK: supple, thyroid normal size, non-tender, without nodularity LYMPH:  no palpable lymphadenopathy in the cervical, axillary or inguinal LUNGS: clear to auscultation and percussion with normal breathing effort HEART: regular rate & rhythm and no murmurs and no lower extremity edema ABDOMEN:abdomen soft, non-tender and normal bowel  sounds Musculoskeletal:no cyanosis of digits and no clubbing  NEURO: alert & oriented x 3 with fluent speech, no focal motor/sensory deficits Extremities: Bilateral lower extremity edema 1+  LABORATORY DATA:  I have reviewed the data as listed CMP Latest Ref Rng & Units 07/29/2018 07/27/2018 07/26/2018  Glucose 70 - 99 mg/dL 97 85 105(H)  BUN 8 - 23 mg/dL 25(H) 24(H) 24(H)  Creatinine 0.44 - 1.00 mg/dL 1.81(H) 1.45(H) 1.46(H)  Sodium 135 - 145 mmol/L 140 140 142  Potassium 3.5 - 5.1 mmol/L 3.5 3.6 3.7  Chloride 98 - 111 mmol/L 106 106 107  CO2 22 - 32 mmol/L 26 24 26   Calcium 8.9 - 10.3 mg/dL 7.7(L) 8.2(L) 7.8(L)  Total Protein 6.5 - 8.1 g/dL - 5.6(L) -  Total Bilirubin 0.3 - 1.2 mg/dL - 0.6 -  Alkaline Phos 38 - 126 U/L - 51 -  AST 15 - 41 U/L - 14(L) -  ALT 0 - 44 U/L - 11 -    Lab Results  Component Value Date   WBC 2.8 (L) 07/29/2018   HGB 7.4 (L) 07/29/2018   HCT 23.4 (L) 07/29/2018   MCV 84.5 07/29/2018   PLT 49 (L) 07/29/2018   NEUTROABS 5.0 07/24/2018    ASSESSMENT AND PLAN: 1.  Metastatic endometrial cancer: I will see the patient back 08/09/2018 and recheck her labs and decide if she can resume chemotherapy 2. pulmonary embolism: Starting on Xarelto today. 3.  Thrombocytopenia: Platelets improved to 49. Return to clinic on 08/09/2018

## 2018-07-30 ENCOUNTER — Telehealth: Payer: Self-pay | Admitting: Hematology and Oncology

## 2018-07-30 ENCOUNTER — Telehealth: Payer: Self-pay

## 2018-07-30 NOTE — Telephone Encounter (Signed)
Received vm from husband to contact pt due to sudden decrease in urine output today, since 2am.   Called pt to see how she was doing. She is currently at [place] at [facility]. Pt states that she has not had any urine output for 6hrs. She had not had enough to drink or eat all day and is experiencing some dizziness. Pt reports last urine output had blood tinged color to it, which was baseline for pt, since being hospitalized. Pt denies any bladder pain, nausea, vomiting or fever at this time. Told pt to proceed to the ED if she starts to have pain or does not produce any urine after drinking plenty of fluids.   Called [facility] nurse station and spoke with Lattie Haw, one of the nurses, and notified her of conversation with pt. They are aware of pt symptoms and had obtained a UA previously this morning. Unfortunately, they do not have access to a bladder scanner, but can try to use a foley cath to see if she is retaining a large amount of urine. They will monitor her closely and recheck her VS for the dizziness and will proceed to ED if needed. No further needs at this time.

## 2018-07-30 NOTE — Telephone Encounter (Signed)
Scheduled appt per 10/31 sch message - pt is aware of appt date and time.

## 2018-08-02 ENCOUNTER — Ambulatory Visit: Payer: Medicare Other

## 2018-08-02 ENCOUNTER — Ambulatory Visit: Payer: Medicare Other | Admitting: Oncology

## 2018-08-02 ENCOUNTER — Other Ambulatory Visit: Payer: Medicare Other

## 2018-08-03 ENCOUNTER — Telehealth: Payer: Self-pay | Admitting: Hematology and Oncology

## 2018-08-03 ENCOUNTER — Telehealth: Payer: Self-pay

## 2018-08-03 NOTE — Telephone Encounter (Signed)
Pt called reporting that she is feeling slightly dizzy and sob. Pt is currently on 2L of oxygen for sats 88-89%. Pt states that she feels tired today. Denies any coughing or pain when breathing. Pt recently discharge from hospital for PE. Pt was supposed to start xarelto, but unsure if she is taking it. She is currently at a rehab facility at [facility]. Called facility to speak to a nurse and get further information on pt medication. LVM with call back number.

## 2018-08-03 NOTE — Telephone Encounter (Signed)
R/s appt per 11/11 VG PAL . Pt is aware of appt date and time.

## 2018-08-06 ENCOUNTER — Ambulatory Visit: Payer: Medicare Other | Admitting: Obstetrics

## 2018-08-09 ENCOUNTER — Ambulatory Visit: Payer: Medicare Other | Admitting: Hematology and Oncology

## 2018-08-10 ENCOUNTER — Other Ambulatory Visit: Payer: Self-pay

## 2018-08-10 ENCOUNTER — Inpatient Hospital Stay: Payer: Medicare Other | Attending: Hematology and Oncology | Admitting: Hematology and Oncology

## 2018-08-10 ENCOUNTER — Telehealth: Payer: Self-pay | Admitting: Hematology and Oncology

## 2018-08-10 DIAGNOSIS — Z7901 Long term (current) use of anticoagulants: Secondary | ICD-10-CM | POA: Diagnosis not present

## 2018-08-10 DIAGNOSIS — R1909 Other intra-abdominal and pelvic swelling, mass and lump: Secondary | ICD-10-CM | POA: Insufficient documentation

## 2018-08-10 DIAGNOSIS — I2699 Other pulmonary embolism without acute cor pulmonale: Secondary | ICD-10-CM | POA: Insufficient documentation

## 2018-08-10 DIAGNOSIS — I82409 Acute embolism and thrombosis of unspecified deep veins of unspecified lower extremity: Secondary | ICD-10-CM

## 2018-08-10 DIAGNOSIS — R590 Localized enlarged lymph nodes: Secondary | ICD-10-CM | POA: Insufficient documentation

## 2018-08-10 DIAGNOSIS — N1330 Unspecified hydronephrosis: Secondary | ICD-10-CM | POA: Insufficient documentation

## 2018-08-10 DIAGNOSIS — C541 Malignant neoplasm of endometrium: Secondary | ICD-10-CM | POA: Diagnosis present

## 2018-08-10 DIAGNOSIS — Z90710 Acquired absence of both cervix and uterus: Secondary | ICD-10-CM | POA: Insufficient documentation

## 2018-08-10 DIAGNOSIS — R918 Other nonspecific abnormal finding of lung field: Secondary | ICD-10-CM | POA: Insufficient documentation

## 2018-08-10 DIAGNOSIS — R768 Other specified abnormal immunological findings in serum: Secondary | ICD-10-CM

## 2018-08-10 NOTE — Telephone Encounter (Signed)
Gave pt avs and calendar  °

## 2018-08-10 NOTE — Progress Notes (Signed)
Patient Care Team: Kayla Ohara, MD as PCP - General (Family Medicine) Kayla Day, MD as Consulting Physician (Orthopedic Surgery)  DIAGNOSIS:  Encounter Diagnosis  Name Primary?  . Malignant neoplasm of endometrium (Kayla Guerrero)     SUMMARY OF ONCOLOGIC HISTORY: Oncology History   Mixed endometrioid and serous ER: 60%, PR 30%, Her 2/neu 1+ MSI Stable     Malignant neoplasm of endometrium (Kayla Guerrero)   04/11/2016 Pathology Results    Endometrium, curettage - HIGH GRADE ENDOMETRIAL CARCINOMA, SEE COMMENT. Microscopic Comment The overall appearance favors serous carcinoma.     04/23/2016 Initial Diagnosis    Patient presented to PCP with new vaginal discharge and some vaginal spotting, referred to Dr Kayla Guerrero, whom she had known previously. D&C on 04-09-16 had high grade carcinoma favoring serous histology [phone]). She was referred to gyn oncology, saw Dr Kayla Guerrero on 04-23-16.     04/30/2016 Imaging    Markedly thickened/widened endometrium consistent with known endometrial cancer. No evidence of serosal or extra uterine extension. 2. No findings to suggest metastatic disease involving the chest, abdomen or pelvis. 3. Indeterminant 12.5 mm right renal lesion, small enhancing mass versus hemorrhagic cyst. Attention on future scans is suggested. 4. Atherosclerotic calcifications involving the thoracic and abdominal aorta and branch vessels but no focal aneurysm. 5. Moderate stool throughout the colon and down into the rectum may suggest constipation.     05/06/2016 Surgery    Robotic hysterectomy and staging. IB USC, 0/11 nodes. Dispositioned to chemotherapy with paclitaxel and carboplatin x 6 with vaginal brachytherapy.    05/06/2016 Pathology Results    1. Lymph node, biopsy, right peri-aortic - ONE OF ONE LYMPH NODES NEGATIVE FOR MALIGNANCY (0/1). 2. Lymph node, biopsy, left peri-aortic - ONE OF ONE LYMPH NODES NEGATIVE FOR MALIGNANCY (0/1). 3. Lymph node, biopsy, right pelvic - FOUR OF  FOUR LYMPH NODES NEGATIVE FOR MALIGNANCY (0/4). 4. Lymph node, biopsy, left pelvic - FIVE OF FIVE LYMPH NODES NEGATIVE FOR MALIGNANCY (0/5). 5. Uterus +/- tubes/ovaries, neoplastic - UTERUS: -ENDO/MYOMETRIUM: INVASIVE MIXED ENDOMETRIOID AND SEROUS CARCINOMA, SPANNING 4 CM. TUMOR INVADES OUTER HALF OF MYOMETRIUM. LYMPHOVASCULAR INVASION PRESENT. SEE ONCOLOGY TABLE. LEIOMYOMA. -SEROSA: UNINVOLVED. NO MALIGNANCY. - CERVIX: BENIGN SQUAMOUS AND ENDOCERVICAL MUCOSA. NO DYSPLASIA OR MALIGNANCY. - BILATERAL OVARIES: INCLUSION CYSTS. NO MALIGNANCY. - BILATERAL FALLOPIAN TUBES: UNREMARKABLE. NO MALIGNANCY.  Specimen: Uterus, cervix, bilateral ovaries and fallopian tubes, bilateral pelvic and para-aortic lymph nodes. Procedure: Hysterectomy with bilateral salpingo-oophorectomy. Lymph node sampling performed: Bilateral pelvic and para-aortic lymph node biopsies Specimen integrity: Intact. 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    05/16/2016 Imaging    Ct abdomen: Extensive subcutaneous emphysema about the abdomen and pelvis, likely postoperative. Probable abdominal pelvic wall small volume hematomas, as above. 2. Right pelvic sidewall fluid collection is likely a seroma or lymphangioma. No explanation for left lower extremity pain. 3. Minimal ill-defined fluid in the presacral space and left adnexa. 4. Small hiatal hernia. 5. An incidental finding of potential clinical significance has been found. Indeterminate right renal lesion is similar to on the recent exam. Consider further evaluation with dedicated outpatient pre and post contrast abdominal MRI    06/11/2016 - 08/22/2016 Chemotherapy    The patient completed only three cycles due to toxicity from paclitaxel and carboplatin.     07/29/2016 Imaging    CT angiogram chest: No demonstrable pulmonary embolus. Multiple foci of atherosclerotic calcification in the aorta as well as foci of  coronary artery calcification.  No edema or consolidation. No lung mass or nodule lesion. No adenopathy. Gallbladder absent.  Stable mild biliary duct prominence. Stable nodular opacity right lobe of thyroid which does not meet consensus guidelines criteria for further assessment    08/06/2016 - 09/03/2016 Radiation Therapy    HDR vaginal cuff brachytherapy x 5 fractions.    08/11/2016 Procedure    Ultrasound and fluoroscopically guided right internal jugular single lumen power port catheter insertion. Tip in the SVC/RA junction. Catheter ready for use.    09/23/2016 Imaging    Ct abdomen: No findings suspicious for metastatic disease in the abdomen or pelvis. 2. Thin-walled lobulated 6.9 x 5.8 x 5.8 cm right pelvic sidewall fluid collection, increased in size since 05/16/2016 CT study, favor a postoperative lymphocele, which demonstrates extrinsic mass-effect on the right bladder wall. 3. Indeterminate 1.2 cm posterior interpolar right renal cortical lesion, for which 4 month stability has been demonstrated, renal neoplasm not excluded. Recommend either dedicated renal protocol MRI or CT abdomen without and with IV contrast or continued attention on follow-up surveillance CT studies, as clinically warranted. 4. Additional findings include aortic atherosclerosis, moderate hiatal hernia, moderate sigmoid diverticulosis, tiny fat containing umbilical hernia, and degenerative disc disease, facet arthropathy and spondylolisthesis in the lower lumbar spine    09/30/2016 Procedure    She had successful CT-guided aspiration of right pelvic fluid collection. Approximately 90 mL yellow serous fluid was aspirated. Samples were sent for culture and cytology.    11/19/2016 Imaging    CT abdomen: No CT findings to suggest recurrent tumor, lymphadenopathy or metastatic disease. 2. Stable right-sided pelvic sidewall cyst with mass effect on the bladder. 3. Stable advanced atherosclerotic calcifications involving the  aorta and iliac arteries. 4. Moderate size hiatal hernia. 5. Status post cholecystectomy with stable intra and extrahepatic biliary dilatation    12/29/2016 Pathology Results    SOFT TISSUE, FINE NEEDLE ASPIRATION, RIGHT ADNEXAL CYST (SPECIMEN 1 OF 1 COLLECTED 09-30-2016) NO MALIGNANT CELLS IDENTIFIED.    02/17/2017 Imaging    CT abdomen 1. The previously noted thin walled cystic lesion in the right pelvis has undergone change in appearance ; it is now thick-walled and rim enhancing. There is associated inflammation and edema surrounding the pelvic sidewall lesion which has also increased in size. This suggests interim inflammation/infection of the cystic collection. 2. Urinary bladder displaced to the left by the thick-walled right pelvic sidewall cystic lesion. Wall thickening of the bladder is likely reactive but could also be due to a cystitis. Suggest correlation with urinalysis. 3. Stable intra and extrahepatic biliary dilatation post cholecystectomy 4. Stable 13 mm intermediate density right renal lesion 5. Diffuse diverticular disease of the colon without acute inflammation    02/17/2017 - 02/20/2017 Hospital Admission    She presented to the emergency department due to lower abdominal pain, diarrhea for 4 days associated with weakness, hypersomnolence following an episode of a gastroenteritis about a week ago after returning from the beach. She was told to decrease her Cymbalta to decrease somnolence. CT Abd/Pelvis was done which revealed that the previously seen thinwall cystic lesions in the right pelvis is now take wall and ring-enhancing with associated inflammation and edema surrounding the pelvic sidewall lesions which also has increased in size. This suggests inter-inflammation/infection of the cystic collection. The urinary bladder is displaced to the left by these lesions. General Surgery, IR, and GYN were consulted.She underwent IR Drain Right Pelvic Drain placement on 5/23 and is  improving. Drain Fluid Cx grew out some Streptococcus  Viridans and Cytology is negative. Her symptoms resolved with antibiotics     02/18/2017 Procedure    Successful CT guided placement of a 10 French all purpose drain catheter into the residual/recurrent right pelvic sidewall fluid collection with aspiration of 50 mL of purulent fluid. Samples were sent to the laboratory for both culture and cytologic analysis.    02/25/2017 Imaging    Significant improvement in the bilobed right pelvic sidewall abscess following percutaneous drain. Stable drain catheter position. Small residual abscess is noted, measurements as above. No new abscess. Otherwise stable CT of the abdomen pelvis with contrast.    03/11/2017 Imaging    Ct abdomen: Resolution of right pelvic infected fluid collection after percutaneous drainage. No further fluid is seen around the percutaneous drain and acute inflammatory changes also have nearly resolved    12/03/2017 Imaging    No abnormality identified within the pelvis.    03/23/2018 Procedure    Technically successful tunneled Port catheter removal.    06/11/2018 Imaging    There are several findings most likely related to metastatic malignancy described below:  Multiple bilateral pulmonary nodules and masses, some of which are cavitary.  Abnormal mediastinal adenopathy.  Multiple liver lesions.  Irregular soft tissue mass in the right side of the pelvis resulting in right ureteral obstruction.  Central mesentery mass in the abdomen extending to the ascending and transverse colon causing wall thickening.  2.4 cm lobulated mass in the distal sigmoid colon    06/14/2018 Tumor Marker    Patient's tumor was tested for the following markers: CA-125 Results of the tumor marker test revealed 96.6     Genetic Testing    Patient has genetic testing done for MSI on Surgical Patholgy from 05/06/2016. Results revealed patient has the following: MSI: stable.    06/21/2018 -   Chemotherapy    The patient had carboplatin    07/12/2018 Tumor Marker    Patient's tumor was tested for the following markers: CA-125 Results of the tumor marker test revealed 95.2     CHIEF COMPLIANT: Metastatic endometrial cancer, patient is here to discuss her treatment plan  INTERVAL HISTORY: Kayla Guerrero is a 83 year old who had metastatic endometrial cancer was in the hospital recently for syncope and was found to have E. coli hydro-nephrosis.  She underwent ureteral stent placement and was treated with antibiotics.  She received 1 dose of chemotherapy with carboplatin and was hospitalized several days.  She does not want to receive any further carboplatin chemo.  She is interested in immunotherapy and had many questions about that today.  She is accompanied by her husband today.  She is still in the wheelchair and does not feel strong enough for more systemic therapy at this time.  She does not have any pain or discomfort.     REVIEW OF SYSTEMS:   Constitutional: Denies fevers, chills or abnormal weight loss Eyes: Denies blurriness of vision Ears, nose, mouth, throat, and face: Denies mucositis or sore throat Respiratory: Denies cough, dyspnea or wheezes Cardiovascular: Denies palpitation, chest discomfort Gastrointestinal:  Denies nausea, heartburn or change in bowel habits Skin: Denies abnormal skin rashes Lymphatics: Denies new lymphadenopathy or easy bruising Neurological: Generalized weakness, wheelchair Behavioral/Psych: Mood is stable, no new changes  Extremities: No lower extremity edema   All other systems were reviewed with the patient and are negative.  I have reviewed the past medical history, past surgical history, social history and family history with the patient and they are unchanged from  previous note.  ALLERGIES:  is allergic to levofloxacin; promethazine hcl; sulfa antibiotics; sulfa drugs cross reactors; and sulfur.  MEDICATIONS:  Current Outpatient  Medications  Medication Sig Dispense Refill  . amLODipine (NORVASC) 10 MG tablet Take 1 tablet (10 mg total) by mouth daily. 30 tablet 0  . dronabinol (MARINOL) 2.5 MG capsule Take 1 capsule (2.5 mg total) by mouth 2 (two) times daily before lunch and supper. 60 capsule 0  . DULoxetine (CYMBALTA) 30 MG capsule Take 30 mg by mouth at bedtime.    [name] levothyroxine (SYNTHROID, LEVOTHROID) 88 MCG tablet TAKE ONE TABLET BY MOUTH ONCE DAILY BEFORE  BREAKFAST. 90 tablet 3  . lidocaine-prilocaine (EMLA) cream Apply 1 application topically as needed. (Patient taking differently: Apply 1 application topically as needed (port access). ) 30 g 6  . Melatonin 5 MG CAPS Take 5 mg by mouth at bedtime.     . metoCLOPramide (REGLAN) 10 MG tablet Take 1 tablet (10 mg total) by mouth 3 (three) times daily before meals. 90 tablet 11  . ondansetron (ZOFRAN) 8 MG tablet Take 1 tablet (8 mg total) by mouth every 8 (eight) hours as needed for nausea. 30 tablet 3  . Polyethyl Glycol-Propyl Glycol (SYSTANE ULTRA) 0.4-0.3 % SOLN Place 1 drop into both eyes daily as needed (for dry eyes).    . polyethylene glycol (MIRALAX / GLYCOLAX) packet Take 17 g by mouth daily.     . rivaroxaban (XARELTO) 20 MG TABS tablet Take 1 tablet (20 mg total) by mouth daily. 30 tablet 1  . triamcinolone (NASACORT) 55 MCG/ACT AERO nasal inhaler Place 1 spray into the nose 2 (two) times daily. 1 Inhaler 12   No current facility-administered medications for this visit.    Facility-Administered Medications Ordered in Other Visits  Medication Dose Route Frequency Provider Last Rate Last Dose  . influenza  inactive virus vaccine (FLUZONE/FLUARIX) injection 0.5 mL  0.5 mL Intramuscular Once Kayla Ohara, MD        PHYSICAL EXAMINATION: ECOG PERFORMANCE STATUS: 1 - Symptomatic but completely ambulatory  Vitals:   08/10/18 1425  BP: (!) 124/57  Pulse: 92  Resp: 17  Temp: 98.9 F (37.2 C)  SpO2: 97%   Filed Weights   08/10/18 1425  Weight: 187  lb 11.2 oz (85.1 kg)    GENERAL:alert, no distress and comfortable SKIN: skin color, texture, turgor are normal, no rashes or significant lesions EYES: normal, Conjunctiva are pink and non-injected, sclera clear OROPHARYNX:no exudate, no erythema and lips, buccal mucosa, and tongue normal  NECK: supple, thyroid normal size, non-tender, without nodularity LYMPH:  no palpable lymphadenopathy in the cervical, axillary or inguinal LUNGS: clear to auscultation and percussion with normal breathing effort HEART: regular rate & rhythm and no murmurs and no lower extremity edema ABDOMEN:abdomen soft, non-tender and normal bowel sounds MUSCULOSKELETAL:no cyanosis of digits and no clubbing  NEURO: alert & oriented x 3 with fluent speech, no focal motor/sensory deficits EXTREMITIES: No lower extremity edema   LABORATORY DATA:  I have reviewed the data as listed CMP Latest Ref Rng & Units 07/29/2018 07/27/2018 07/26/2018  Glucose 70 - 99 mg/dL 97 85 105(H)  BUN 8 - 23 mg/dL 25(H) 24(H) 24(H)  Creatinine 0.44 - 1.00 mg/dL 1.81(H) 1.45(H) 1.46(H)  Sodium 135 - 145 mmol/L 140 140 142  Potassium 3.5 - 5.1 mmol/L 3.5 3.6 3.7  Chloride 98 - 111 mmol/L 106 106 107  CO2 22 - 32 mmol/L '26 24 26  '$ Calcium 8.9 - 10.3  mg/dL 7.7(L) 8.2(L) 7.8(L)  Total Protein 6.5 - 8.1 g/dL - 5.6(L) -  Total Bilirubin 0.3 - 1.2 mg/dL - 0.6 -  Alkaline Phos 38 - 126 U/L - 51 -  AST 15 - 41 U/L - 14(L) -  ALT 0 - 44 U/L - 11 -    Lab Results  Component Value Date   WBC 2.8 (L) 07/29/2018   HGB 7.4 (L) 07/29/2018   HCT 23.4 (L) 07/29/2018   MCV 84.5 07/29/2018   PLT 49 (L) 07/29/2018   NEUTROABS 5.0 07/24/2018    ASSESSMENT & PLAN:  Malignant neoplasm of endometrium ([place]) Metastatic endometrial cancer: with lung nodules, mediastinal adenopathy, multiple liver lesions, was on treatment with carboplatin.    Hospitalized for syncope due to large pulmonary emboli and DVT, 07/20/2018 to 96/72/8979 complicated by  thrombocytopenia, also had E. coli bacteremia due to chronic right-sided hydronephrosis, left ureteral stent placed  Current treatment for PE/DVT: Xarelto Thrombocytopenia work-up: DIC panel and HIT panel were negative  Chemo discussion/counseling: Patient is not interested in receiving any further chemotherapy.  She was curious about immunotherapy options.  I discussed with her the mechanism of action of immunotherapy.  I also discussed with her that the likelihood of response to immunotherapy can be 25% of patients.  We talked about the risks and benefits of immunotherapy.  Prognosis: Although it is difficult to determine her accurate prognosis, given the fact that she has liver peritoneal and lung nodules, and the fact that she has not recovered from the recent hospitalization and given her age, I suspect that without treatment she may have less than 6 months to live. DNR CC  I removed some of the unnecessary medications like vitamins and supplements from her list. Based on this, patient requested that we get hospice and to meet with her at home. Before making a final decision she would like to sit down and discuss this with Dr. Alvy Bimler.  I will make an appointment for her in 3 weeks to be seen here.    No orders of the defined types were placed in this encounter.  The patient has a good understanding of the overall plan. she agrees with it. she will call with any problems that may develop before the next visit here.   Harriette Ohara, MD 08/10/18

## 2018-08-10 NOTE — Progress Notes (Signed)
Per Dr.Gudena, contacted hospice for a referral.

## 2018-08-10 NOTE — Assessment & Plan Note (Addendum)
Metastatic endometrial cancer: with lung nodules, mediastinal adenopathy, multiple liver lesions, was on treatment with carboplatin.    Hospitalized for syncope due to large pulmonary emboli and DVT, 07/20/2018 to 26/33/3545 complicated by thrombocytopenia, also had E. coli bacteremia due to chronic right-sided hydronephrosis, left ureteral stent placed  Current treatment for PE/DVT: Xarelto Thrombocytopenia work-up: DIC panel and HIT panel were negative  Chemo discussion/counseling: Patient is not interested in receiving any further chemotherapy.  She was curious about immunotherapy options.  I discussed with her the mechanism of action of immunotherapy.  I also discussed with her that the likelihood of response to immunotherapy can be 25% of patients.  We talked about the risks and benefits of immunotherapy.  Prognosis: Although it is difficult to determine her accurate prognosis, given the fact that she has liver peritoneal and lung nodules, and the fact that she has not recovered from the recent hospitalization and given her age, I suspect that without treatment she may have less than 6 months to live.  Based on this, patient requested that we get hospice and to meet with her at home. Before making a final decision she would like to sit down and discuss this with Dr. Alvy Bimler.  I will make an appointment for her in 3 weeks to be seen here.

## 2018-08-11 ENCOUNTER — Ambulatory Visit: Payer: Medicare Other | Admitting: Obstetrics

## 2018-08-18 ENCOUNTER — Telehealth: Payer: Self-pay

## 2018-08-18 NOTE — Telephone Encounter (Signed)
Dortha Kern, RN with hospice calling to update on patient.  Patient has requested to no longer take Xarelto.  Per Amy patient has also had increase in weakness, increasing her risk for falls.  Pt with fall last week.  Skin tear was only injury noted.  Hypotension noted today by nurse of 92/58.  Dortha Kern, RN received orders to hold amlodipine.

## 2018-08-23 ENCOUNTER — Other Ambulatory Visit: Payer: Self-pay

## 2018-08-23 MED ORDER — HYDROMORPHONE HCL 2 MG PO TABS: 2.0000 mg | ORAL_TABLET | ORAL | 0 refills | Status: AC | PRN

## 2018-08-23 NOTE — Telephone Encounter (Signed)
Received call from [facility]) regarding needing refill on pt dilaudid 2mg  q4h, to be sent to local [place]. Called Sherri back to let her know that it will be faxed by the end of business day today per Dr.Gudena.

## 2018-08-24 ENCOUNTER — Telehealth: Payer: Self-pay

## 2018-08-24 NOTE — Telephone Encounter (Signed)
Amy from [place] called to get an ok from [place] to increase pt dilaudid to 4mg  every 8hrs as needed and 2mg  q4h for breakthrough pain. Pt is not having pain relief with 2mg  dilaudid q4h, which was recently refilled yesterday. Per Dr.Gudena, okay to do this. Amy,RN also getting verbal orders for nausea (compazine). Ok to give this per MD as well.No further needs at this time.

## 2018-08-31 ENCOUNTER — Telehealth: Payer: Self-pay

## 2018-08-31 NOTE — Telephone Encounter (Signed)
Husband called and left a message. Kayla Guerrero is in hospice at [facility]. Need to cancel this Thursdays appt with Dr. Alvy Bimler. Appt canceled.

## 2018-09-02 ENCOUNTER — Inpatient Hospital Stay: Payer: Medicare Other | Admitting: Hematology and Oncology

## 2018-09-29 DEATH — deceased

## 2018-11-05 ENCOUNTER — Telehealth: Payer: Self-pay | Admitting: *Deleted

## 2018-11-05 NOTE — Telephone Encounter (Signed)
CALLED PATIENT TO ALTER FU APPT. DUE TO DR. KINARD BEING IN THE OR, SPOKE WITH PATIENT'S HUSBAND AND HE TOLD ME THAT SHE HAS PASSED AWAY, INFORMED DR. [facility] NURSE

## 2018-11-08 ENCOUNTER — Ambulatory Visit: Payer: Self-pay | Admitting: Radiation Oncology

## 2018-11-11 ENCOUNTER — Ambulatory Visit: Payer: Self-pay | Admitting: Radiation Oncology

## 2019-02-23 IMAGING — CT CT ABD-PELV W/O CM
2 of 4 series · 16 of 46 positions shown, 18 images · non-contrast
Comparison: CTA 07/14/2018, CT 06/11/2018

CLINICAL DATA: Endometrial carcinoma.  Lung mass.

EXAM:
CT ABDOMEN AND PELVIS WITHOUT CONTRAST
TECHNIQUE: Multidetector CT imaging of the abdomen and pelvis was performed
following the standard protocol without IV contrast.

[Series 2: axial st · axial · 0.94mm/px · z∈[-422,-32]mm · 13 of 88 slices shown, 15 images]
[im 5/88  soft-tissue]
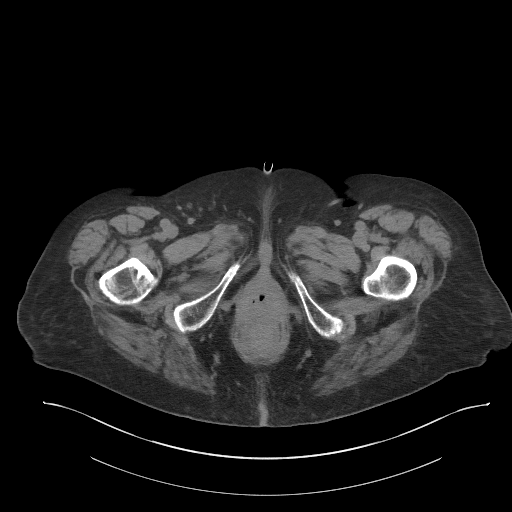
[im 5/88  bone]
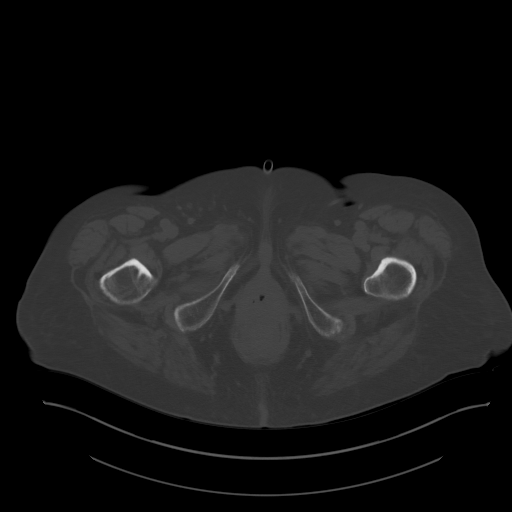
[im 13/88  soft-tissue]
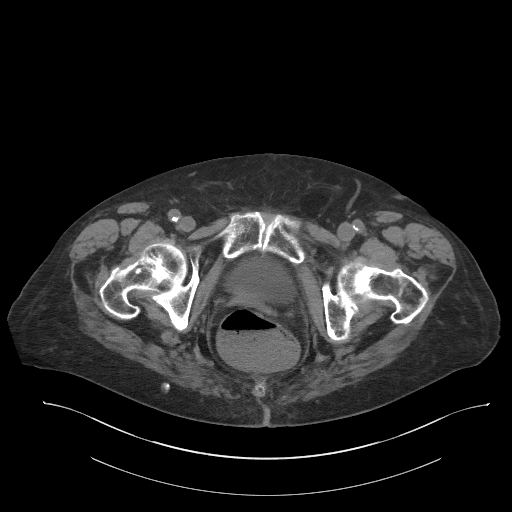
[im 17/88  soft-tissue]
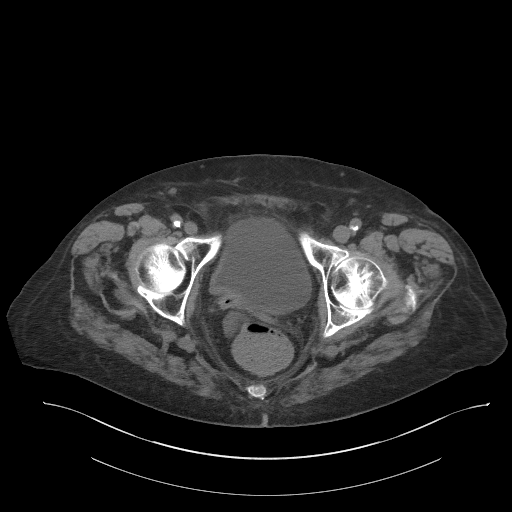
[im 25/88  soft-tissue]
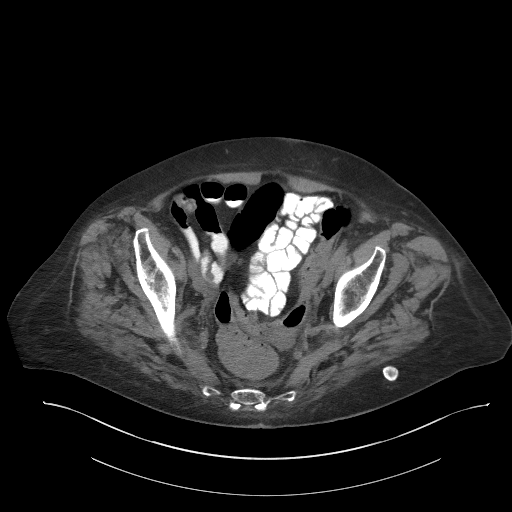
[im 30/88  soft-tissue]
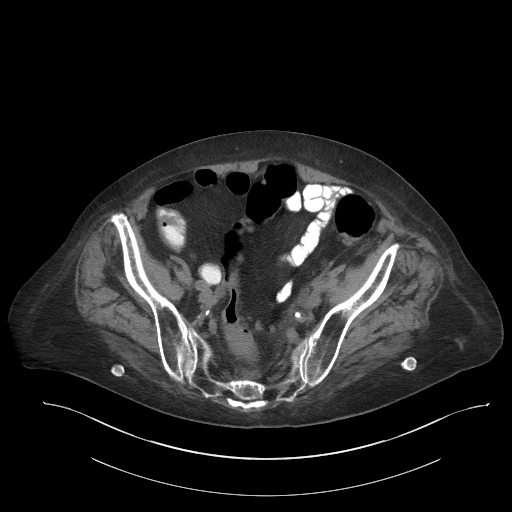
[im 38/88  soft-tissue]
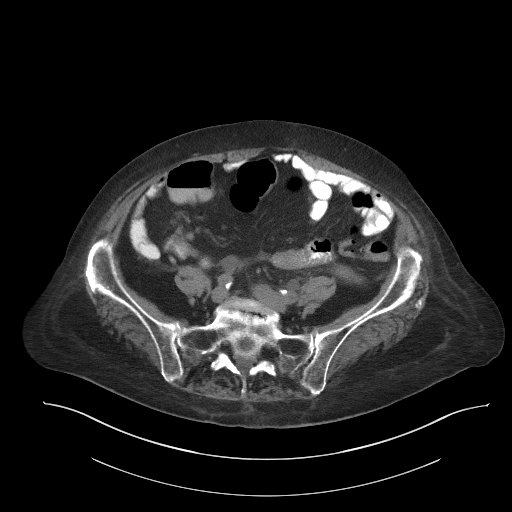
[im 46/88  soft-tissue]
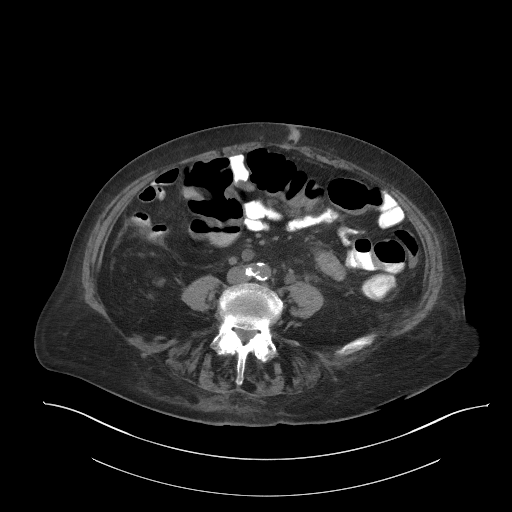
[im 50/88  soft-tissue]
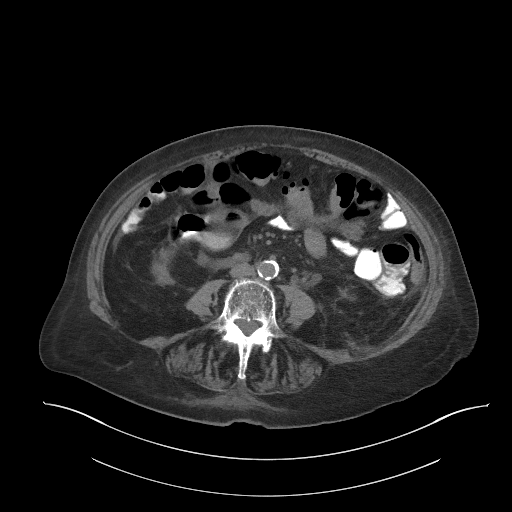
[im 59/88  soft-tissue]
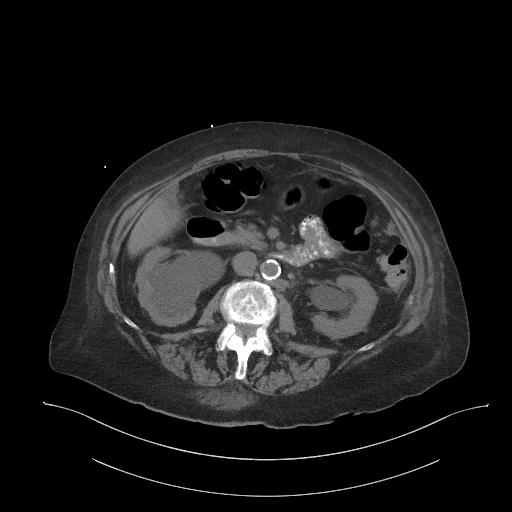
[im 59/88  bone]
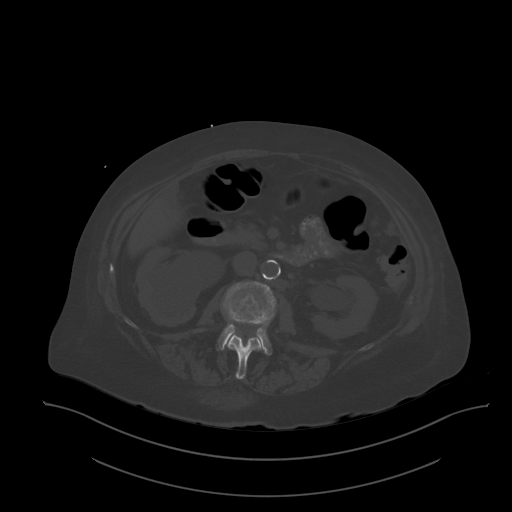
[im 63/88  soft-tissue]
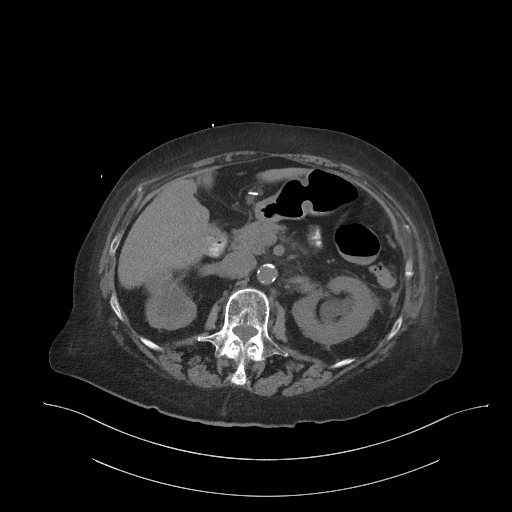
[im 71/88  soft-tissue]
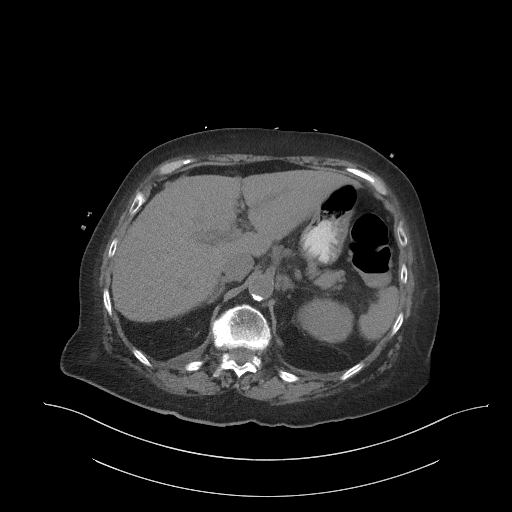
[im 75/88  soft-tissue]
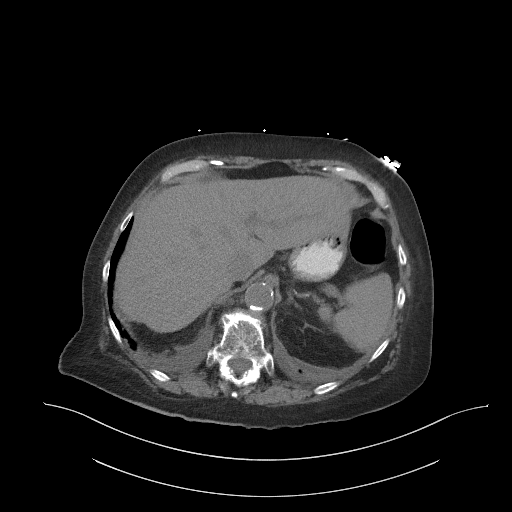
[im 83/88  soft-tissue]
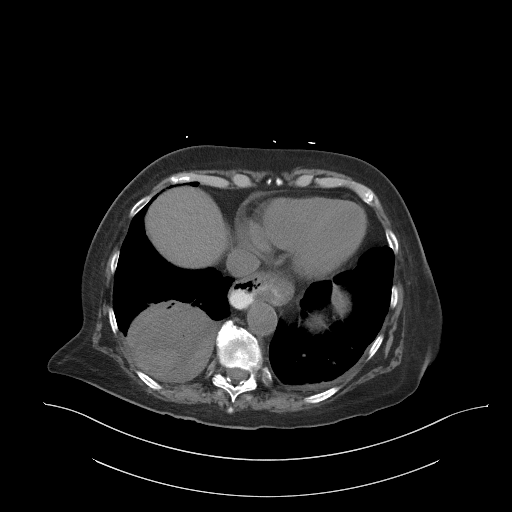

[Series 5: coronal st · coronal · 0.80mm/px · 3 of 92 slices shown]
[im 31/92  soft-tissue]
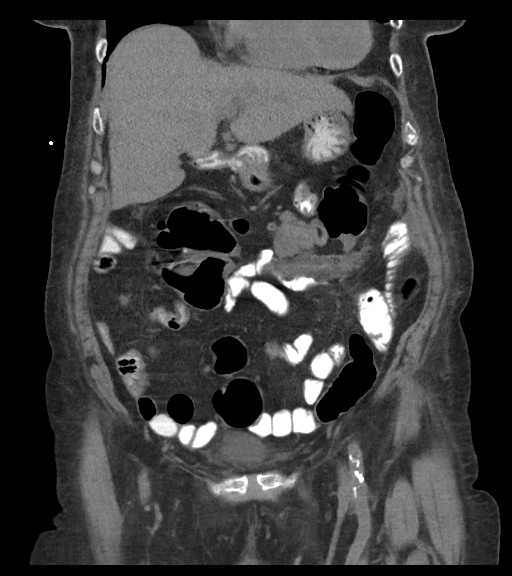
[im 41/92  soft-tissue]
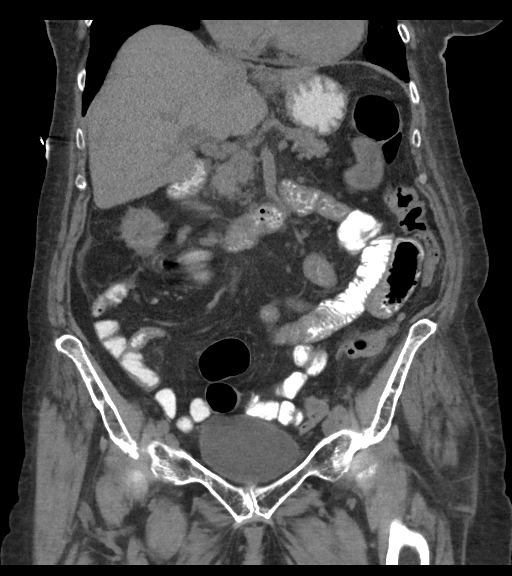
[im 51/92  soft-tissue]
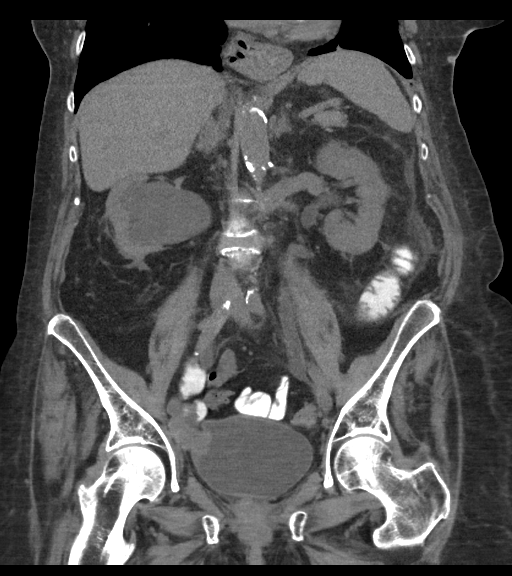

[16 of 46 positions shown; findings below may reference images not displayed]

FINDINGS: Lower chest: Large RIGHT lower lobe mass again demonstrated
measuring 8.8 cm compared to 7.8 cm on recent CT. There is central
gas within lesions suggesting necrotic tumor.

Hepatobiliary: No focal hepatic lesion on noncontrast exam.
Postcholecystectomy.

Pancreas: Pancreas is normal. No ductal dilatation. No pancreatic
inflammation.

Spleen: Normal spleen

Adrenals/urinary tract: Adrenal glands normal. There is severe
hydronephrosis and hydroureter on the RIGHT. Hydroureter extends to
the distal ureter where there is a mass along the RIGHT lateral wall
of the bladder measuring 3.9 by 2.5 cm (image 69/2) which compares
to 5.4 by 3.3 cm for potential decrease in size.

There is mild hydronephrosis hydroureter ofthe LEFT collecting
system without obstructing lesion identified. The hydroureter
extends to the mid ureter and is increased from prior.

Bladder normal

Stomach/Bowel: Hiatal hernia. Stomach, duodenum small-bowel normal.
No evidence of bowel obstruction. Appendix is normal. Colon
rectosigmoid colon are normal

Vascular/Lymphatic: Abdominal aortic normal caliber. There is
intimal calcification. No retroperitoneal adenopathy. No pelvic
adenopathy

Reproductive: Post hysterectomy.

Other: There is thickening within the ventral mesentery superior to
the transverse colon (lesser omentum) measuring 7.5 by 1.9. This
finding is similar to comparison exam. Small peritoneal implants
along thesigmoid colon (image 57/2) also unchanged.

Musculoskeletal: No aggressive osseous lesion
IMPRESSION: 1. RIGHT lower lobe necrotic mass slightly increased in size from
comparison exam is concerning for necrotic neoplasm / metastasis.
2. Severe hydronephrosis and hydroureter of the RIGHT collecting
system secondary to obstructing at RIGHT pelvic sidewall. No
significant change hydronephrosis.
3. No significant change in RIGHT pelvic sidewall mass.
4. New mild hydronephrosis hydroureter on the LEFT. No clear
obstructing lesion identified.
5. Stable peritoneal and omental metastasis.

## 2019-02-28 ENCOUNTER — Encounter: Payer: Self-pay | Admitting: Hematology and Oncology
